# Patient Record
Sex: Female | Born: 1950 | Race: Black or African American | Hispanic: No | Marital: Single | Smoking: Current every day smoker
Health system: Southern US, Community
[De-identification: ages and names within clinical notes are randomized; demographics above are authoritative.]

## PROBLEM LIST (undated history)

## (undated) DIAGNOSIS — M48061 Spinal stenosis, lumbar region without neurogenic claudication: Secondary | ICD-10-CM

## (undated) DIAGNOSIS — E785 Hyperlipidemia, unspecified: Secondary | ICD-10-CM

## (undated) DIAGNOSIS — I509 Heart failure, unspecified: Secondary | ICD-10-CM

## (undated) DIAGNOSIS — M545 Low back pain, unspecified: Secondary | ICD-10-CM

## (undated) DIAGNOSIS — D649 Anemia, unspecified: Secondary | ICD-10-CM

## (undated) DIAGNOSIS — M25562 Pain in left knee: Secondary | ICD-10-CM

## (undated) DIAGNOSIS — N951 Menopausal and female climacteric states: Secondary | ICD-10-CM

## (undated) DIAGNOSIS — I10 Essential (primary) hypertension: Secondary | ICD-10-CM

## (undated) DIAGNOSIS — E538 Deficiency of other specified B group vitamins: Secondary | ICD-10-CM

## (undated) DIAGNOSIS — T148XXA Other injury of unspecified body region, initial encounter: Secondary | ICD-10-CM

## (undated) DIAGNOSIS — F329 Major depressive disorder, single episode, unspecified: Secondary | ICD-10-CM

## (undated) DIAGNOSIS — G629 Polyneuropathy, unspecified: Secondary | ICD-10-CM

## (undated) DIAGNOSIS — J320 Chronic maxillary sinusitis: Secondary | ICD-10-CM

## (undated) DIAGNOSIS — I2510 Atherosclerotic heart disease of native coronary artery without angina pectoris: Secondary | ICD-10-CM

## (undated) DIAGNOSIS — J40 Bronchitis, not specified as acute or chronic: Secondary | ICD-10-CM

## (undated) DIAGNOSIS — D126 Benign neoplasm of colon, unspecified: Secondary | ICD-10-CM

## (undated) DIAGNOSIS — I469 Cardiac arrest, cause unspecified: Secondary | ICD-10-CM

## (undated) DIAGNOSIS — K5730 Diverticulosis of large intestine without perforation or abscess without bleeding: Secondary | ICD-10-CM

## (undated) DIAGNOSIS — K529 Noninfective gastroenteritis and colitis, unspecified: Secondary | ICD-10-CM

## (undated) DIAGNOSIS — F32A Depression, unspecified: Secondary | ICD-10-CM

## (undated) DIAGNOSIS — M7072 Other bursitis of hip, left hip: Secondary | ICD-10-CM

## (undated) DIAGNOSIS — M1990 Unspecified osteoarthritis, unspecified site: Secondary | ICD-10-CM

## (undated) DIAGNOSIS — R110 Nausea: Secondary | ICD-10-CM

## (undated) DIAGNOSIS — B373 Candidiasis of vulva and vagina: Secondary | ICD-10-CM

## (undated) DIAGNOSIS — G4733 Obstructive sleep apnea (adult) (pediatric): Principal | ICD-10-CM

## (undated) DIAGNOSIS — B3731 Acute candidiasis of vulva and vagina: Secondary | ICD-10-CM

## (undated) DIAGNOSIS — H6690 Otitis media, unspecified, unspecified ear: Secondary | ICD-10-CM

## (undated) DIAGNOSIS — R5383 Other fatigue: Secondary | ICD-10-CM

## (undated) DIAGNOSIS — S42309A Unspecified fracture of shaft of humerus, unspecified arm, initial encounter for closed fracture: Secondary | ICD-10-CM

## (undated) DIAGNOSIS — H6120 Impacted cerumen, unspecified ear: Secondary | ICD-10-CM

## (undated) DIAGNOSIS — F17200 Nicotine dependence, unspecified, uncomplicated: Secondary | ICD-10-CM

## (undated) DIAGNOSIS — F419 Anxiety disorder, unspecified: Secondary | ICD-10-CM

## (undated) HISTORY — DX: Hyperlipidemia, unspecified: E78.5

## (undated) HISTORY — DX: Benign neoplasm of colon, unspecified: D12.6

## (undated) HISTORY — PX: CORONARY ANGIOPLASTY WITH STENT PLACEMENT: SHX49

## (undated) HISTORY — DX: Otitis media, unspecified, unspecified ear: H66.90

## (undated) HISTORY — DX: Atherosclerotic heart disease of native coronary artery without angina pectoris: I25.10

## (undated) HISTORY — DX: Pain in left knee: M25.562

## (undated) HISTORY — DX: Anemia, unspecified: D64.9

## (undated) HISTORY — DX: Anxiety disorder, unspecified: F41.9

## (undated) HISTORY — DX: Nausea: R11.0

## (undated) HISTORY — DX: Noninfective gastroenteritis and colitis, unspecified: K52.9

## (undated) HISTORY — DX: Impacted cerumen, unspecified ear: H61.20

## (undated) HISTORY — DX: Other fatigue: R53.83

## (undated) HISTORY — DX: Other bursitis of hip, left hip: M70.72

## (undated) HISTORY — PX: EYE SURGERY: SHX253

## (undated) HISTORY — DX: Nicotine dependence, unspecified, uncomplicated: F17.200

## (undated) HISTORY — DX: Polyneuropathy, unspecified: G62.9

## (undated) HISTORY — DX: Spinal stenosis, lumbar region without neurogenic claudication: M48.061

## (undated) HISTORY — DX: Low back pain: M54.5

## (undated) HISTORY — DX: Deficiency of other specified B group vitamins: E53.8

## (undated) HISTORY — DX: Bronchitis, not specified as acute or chronic: J40

## (undated) HISTORY — DX: Major depressive disorder, single episode, unspecified: F32.9

## (undated) HISTORY — DX: Other injury of unspecified body region, initial encounter: T14.8XXA

## (undated) HISTORY — DX: Menopausal and female climacteric states: N95.1

## (undated) HISTORY — DX: Essential (primary) hypertension: I10

## (undated) HISTORY — DX: Obstructive sleep apnea (adult) (pediatric): G47.33

## (undated) HISTORY — DX: Depression, unspecified: F32.A

## (undated) HISTORY — DX: Candidiasis of vulva and vagina: B37.3

## (undated) HISTORY — DX: Acute candidiasis of vulva and vagina: B37.31

## (undated) HISTORY — PX: CARDIAC CATHETERIZATION: SHX172

## (undated) HISTORY — DX: Chronic maxillary sinusitis: J32.0

## (undated) HISTORY — DX: Diverticulosis of large intestine without perforation or abscess without bleeding: K57.30

## (undated) HISTORY — DX: Low back pain, unspecified: M54.50

---

## 2001-04-13 HISTORY — PX: CERVICAL SPINE SURGERY: SHX589

## 2002-03-16 ENCOUNTER — Encounter: Payer: Self-pay | Admitting: Neurosurgery

## 2002-03-20 ENCOUNTER — Inpatient Hospital Stay (HOSPITAL_COMMUNITY): Admission: RE | Admit: 2002-03-20 | Discharge: 2002-03-22 | Payer: Self-pay | Admitting: Neurosurgery

## 2002-03-20 ENCOUNTER — Encounter: Payer: Self-pay | Admitting: Neurosurgery

## 2002-09-29 ENCOUNTER — Encounter: Admission: RE | Admit: 2002-09-29 | Discharge: 2002-12-19 | Payer: Self-pay | Admitting: Internal Medicine

## 2003-03-19 ENCOUNTER — Encounter: Admission: RE | Admit: 2003-03-19 | Discharge: 2003-03-19 | Payer: Self-pay | Admitting: Internal Medicine

## 2003-09-24 ENCOUNTER — Encounter: Admission: RE | Admit: 2003-09-24 | Discharge: 2003-09-24 | Payer: Self-pay | Admitting: Internal Medicine

## 2004-02-20 ENCOUNTER — Ambulatory Visit: Payer: Self-pay | Admitting: Internal Medicine

## 2004-03-31 ENCOUNTER — Ambulatory Visit: Payer: Self-pay | Admitting: Internal Medicine

## 2004-05-02 ENCOUNTER — Ambulatory Visit: Payer: Self-pay

## 2004-05-07 ENCOUNTER — Ambulatory Visit: Payer: Self-pay | Admitting: Internal Medicine

## 2004-05-16 ENCOUNTER — Ambulatory Visit: Payer: Self-pay | Admitting: Internal Medicine

## 2004-05-16 ENCOUNTER — Ambulatory Visit: Payer: Self-pay | Admitting: Cardiology

## 2004-05-27 ENCOUNTER — Ambulatory Visit: Payer: Self-pay | Admitting: Cardiovascular Disease

## 2004-05-27 ENCOUNTER — Ambulatory Visit: Payer: Self-pay | Admitting: Internal Medicine

## 2004-05-30 ENCOUNTER — Inpatient Hospital Stay (HOSPITAL_BASED_OUTPATIENT_CLINIC_OR_DEPARTMENT_OTHER): Admission: RE | Admit: 2004-05-30 | Discharge: 2004-05-30 | Payer: Self-pay | Admitting: Cardiovascular Disease

## 2004-06-02 ENCOUNTER — Ambulatory Visit (HOSPITAL_COMMUNITY): Admission: RE | Admit: 2004-06-02 | Discharge: 2004-06-03 | Payer: Self-pay | Admitting: Cardiology

## 2004-06-02 ENCOUNTER — Ambulatory Visit: Payer: Self-pay | Admitting: Cardiovascular Disease

## 2004-06-24 ENCOUNTER — Ambulatory Visit: Payer: Self-pay | Admitting: Internal Medicine

## 2004-07-03 ENCOUNTER — Ambulatory Visit: Payer: Self-pay | Admitting: Cardiovascular Disease

## 2004-07-18 ENCOUNTER — Ambulatory Visit: Payer: Self-pay | Admitting: Internal Medicine

## 2004-07-18 ENCOUNTER — Ambulatory Visit (HOSPITAL_COMMUNITY): Admission: RE | Admit: 2004-07-18 | Discharge: 2004-07-18 | Payer: Self-pay | Admitting: Internal Medicine

## 2004-10-09 ENCOUNTER — Ambulatory Visit: Payer: Self-pay | Admitting: Cardiovascular Disease

## 2004-10-24 ENCOUNTER — Ambulatory Visit: Payer: Self-pay | Admitting: Internal Medicine

## 2004-10-29 ENCOUNTER — Ambulatory Visit: Payer: Self-pay | Admitting: Internal Medicine

## 2005-01-01 ENCOUNTER — Ambulatory Visit: Payer: Self-pay | Admitting: Endocrinology

## 2005-01-08 ENCOUNTER — Ambulatory Visit: Payer: Self-pay | Admitting: Cardiovascular Disease

## 2005-01-08 ENCOUNTER — Ambulatory Visit: Payer: Self-pay | Admitting: Cardiology

## 2005-01-08 ENCOUNTER — Ambulatory Visit: Payer: Self-pay

## 2005-01-14 ENCOUNTER — Inpatient Hospital Stay (HOSPITAL_BASED_OUTPATIENT_CLINIC_OR_DEPARTMENT_OTHER): Admission: RE | Admit: 2005-01-14 | Discharge: 2005-01-14 | Payer: Self-pay | Admitting: Cardiology

## 2005-01-14 ENCOUNTER — Ambulatory Visit: Payer: Self-pay | Admitting: Cardiology

## 2005-01-22 ENCOUNTER — Ambulatory Visit: Payer: Self-pay | Admitting: Cardiovascular Disease

## 2005-02-02 ENCOUNTER — Ambulatory Visit: Admission: RE | Admit: 2005-02-02 | Discharge: 2005-02-02 | Payer: Self-pay | Admitting: Cardiovascular Disease

## 2005-02-04 ENCOUNTER — Ambulatory Visit: Payer: Self-pay | Admitting: Internal Medicine

## 2005-02-09 ENCOUNTER — Ambulatory Visit: Payer: Self-pay | Admitting: Pulmonary Disease

## 2005-04-20 ENCOUNTER — Ambulatory Visit: Payer: Self-pay | Admitting: Internal Medicine

## 2005-05-13 ENCOUNTER — Ambulatory Visit: Payer: Self-pay | Admitting: Internal Medicine

## 2005-05-25 ENCOUNTER — Ambulatory Visit: Payer: Self-pay | Admitting: Cardiovascular Disease

## 2005-05-28 ENCOUNTER — Ambulatory Visit: Payer: Self-pay | Admitting: Internal Medicine

## 2005-10-19 ENCOUNTER — Ambulatory Visit: Payer: Self-pay | Admitting: Internal Medicine

## 2005-10-23 ENCOUNTER — Ambulatory Visit: Payer: Self-pay | Admitting: Internal Medicine

## 2005-11-06 ENCOUNTER — Ambulatory Visit: Payer: Self-pay | Admitting: Cardiovascular Disease

## 2006-03-11 ENCOUNTER — Ambulatory Visit: Payer: Self-pay | Admitting: Internal Medicine

## 2006-05-13 ENCOUNTER — Ambulatory Visit: Payer: Self-pay | Admitting: Cardiovascular Disease

## 2006-07-13 ENCOUNTER — Ambulatory Visit: Payer: Self-pay | Admitting: Internal Medicine

## 2006-07-21 ENCOUNTER — Encounter: Admission: RE | Admit: 2006-07-21 | Discharge: 2006-07-21 | Payer: Self-pay | Admitting: Internal Medicine

## 2006-07-23 ENCOUNTER — Ambulatory Visit: Payer: Self-pay | Admitting: Internal Medicine

## 2006-07-23 LAB — CONVERTED CEMR LAB
Albumin: 3.8 g/dL (ref 3.5–5.2)
Alkaline Phosphatase: 56 units/L (ref 39–117)
Bilirubin, Direct: 0.1 mg/dL (ref 0.0–0.3)
CO2: 32 meq/L (ref 19–32)
Glucose, Bld: 157 mg/dL — ABNORMAL HIGH (ref 70–99)
LDL Cholesterol: 52 mg/dL (ref 0–99)
Microalb, Ur: 0.2 mg/dL (ref 0.0–1.9)
Potassium: 3.7 meq/L (ref 3.5–5.1)
Sodium: 143 meq/L (ref 135–145)
Total CHOL/HDL Ratio: 2.8
VLDL: 21 mg/dL (ref 0–40)

## 2006-08-23 ENCOUNTER — Ambulatory Visit (HOSPITAL_COMMUNITY): Admission: RE | Admit: 2006-08-23 | Discharge: 2006-08-23 | Payer: Self-pay | Admitting: Neurosurgery

## 2006-11-02 ENCOUNTER — Ambulatory Visit: Payer: Self-pay

## 2006-11-09 ENCOUNTER — Ambulatory Visit: Payer: Self-pay | Admitting: Cardiovascular Disease

## 2006-11-23 ENCOUNTER — Ambulatory Visit: Payer: Self-pay | Admitting: Internal Medicine

## 2006-11-23 LAB — CONVERTED CEMR LAB
Bilirubin Urine: NEGATIVE
Ketones, ur: NEGATIVE mg/dL
Leukocytes, UA: NEGATIVE
Nitrite: NEGATIVE
RBC / HPF: NONE SEEN
Urobilinogen, UA: 0.2 (ref 0.0–1.0)

## 2007-01-13 ENCOUNTER — Ambulatory Visit: Payer: Self-pay | Admitting: Gastroenterology

## 2007-02-08 ENCOUNTER — Encounter: Admission: RE | Admit: 2007-02-08 | Discharge: 2007-02-08 | Payer: Self-pay | Admitting: Obstetrics and Gynecology

## 2007-02-18 ENCOUNTER — Encounter: Payer: Self-pay | Admitting: Gastroenterology

## 2007-02-18 ENCOUNTER — Ambulatory Visit: Payer: Self-pay | Admitting: Gastroenterology

## 2007-02-18 ENCOUNTER — Encounter: Payer: Self-pay | Admitting: Internal Medicine

## 2007-02-18 DIAGNOSIS — D126 Benign neoplasm of colon, unspecified: Secondary | ICD-10-CM

## 2007-02-25 ENCOUNTER — Ambulatory Visit: Payer: Self-pay | Admitting: Cardiovascular Disease

## 2007-03-25 ENCOUNTER — Ambulatory Visit: Payer: Self-pay | Admitting: Internal Medicine

## 2007-03-25 DIAGNOSIS — I10 Essential (primary) hypertension: Secondary | ICD-10-CM | POA: Insufficient documentation

## 2007-03-25 DIAGNOSIS — I2510 Atherosclerotic heart disease of native coronary artery without angina pectoris: Secondary | ICD-10-CM | POA: Insufficient documentation

## 2007-03-25 DIAGNOSIS — G609 Hereditary and idiopathic neuropathy, unspecified: Secondary | ICD-10-CM | POA: Insufficient documentation

## 2007-03-25 DIAGNOSIS — E785 Hyperlipidemia, unspecified: Secondary | ICD-10-CM | POA: Insufficient documentation

## 2007-03-25 DIAGNOSIS — F411 Generalized anxiety disorder: Secondary | ICD-10-CM | POA: Insufficient documentation

## 2007-03-25 DIAGNOSIS — F329 Major depressive disorder, single episode, unspecified: Secondary | ICD-10-CM

## 2007-03-25 DIAGNOSIS — K5730 Diverticulosis of large intestine without perforation or abscess without bleeding: Secondary | ICD-10-CM | POA: Insufficient documentation

## 2007-03-30 ENCOUNTER — Ambulatory Visit: Payer: Self-pay | Admitting: Gastroenterology

## 2007-06-23 ENCOUNTER — Ambulatory Visit: Payer: Self-pay | Admitting: Internal Medicine

## 2007-06-23 DIAGNOSIS — N951 Menopausal and female climacteric states: Secondary | ICD-10-CM | POA: Insufficient documentation

## 2007-06-23 DIAGNOSIS — M76899 Other specified enthesopathies of unspecified lower limb, excluding foot: Secondary | ICD-10-CM

## 2007-06-30 ENCOUNTER — Telehealth: Payer: Self-pay | Admitting: Internal Medicine

## 2007-08-03 ENCOUNTER — Ambulatory Visit: Payer: Self-pay | Admitting: Internal Medicine

## 2007-08-03 LAB — CONVERTED CEMR LAB
ALT: 14 units/L (ref 0–35)
AST: 17 units/L (ref 0–37)
Albumin: 3.9 g/dL (ref 3.5–5.2)
Alkaline Phosphatase: 68 units/L (ref 39–117)
BUN: 9 mg/dL (ref 6–23)
Bilirubin, Direct: 0.1 mg/dL (ref 0.0–0.3)
Calcium: 9.1 mg/dL (ref 8.4–10.5)
Chloride: 104 meq/L (ref 96–112)
Cholesterol: 135 mg/dL (ref 0–200)
GFR calc Af Amer: 134 mL/min
GFR calc non Af Amer: 111 mL/min
HDL: 46.5 mg/dL (ref >39.0)
Hgb A1c MFr Bld: 7.3 % — ABNORMAL HIGH (ref 4.6–6.0)
Microalb, Ur: 3.4 mg/dL — ABNORMAL HIGH (ref 0.0–1.9)
Sodium: 142 meq/L (ref 135–145)
Total Bilirubin: 0.6 mg/dL (ref 0.3–1.2)
Total CHOL/HDL Ratio: 2.9
Total Protein: 7.4 g/dL (ref 6.0–8.3)

## 2007-08-04 ENCOUNTER — Ambulatory Visit: Payer: Self-pay | Admitting: Internal Medicine

## 2007-08-08 ENCOUNTER — Encounter: Payer: Self-pay | Admitting: Internal Medicine

## 2007-09-02 ENCOUNTER — Ambulatory Visit: Payer: Self-pay | Admitting: Cardiovascular Disease

## 2007-10-27 ENCOUNTER — Ambulatory Visit: Payer: Self-pay | Admitting: Cardiovascular Disease

## 2007-10-27 ENCOUNTER — Encounter: Payer: Self-pay | Admitting: Internal Medicine

## 2007-10-27 ENCOUNTER — Ambulatory Visit: Payer: Self-pay

## 2007-10-27 LAB — CONVERTED CEMR LAB
BUN: 9 mg/dL (ref 6–23)
Basophils Absolute: 0 10*3/uL (ref 0.0–0.1)
Basophils Relative: 0.9 % (ref 0.0–3.0)
Calcium: 9.2 mg/dL (ref 8.4–10.5)
Eosinophils Relative: 4 % (ref 0.0–5.0)
GFR calc non Af Amer: 111 mL/min
Glucose, Bld: 260 mg/dL — ABNORMAL HIGH (ref 70–99)
Hemoglobin: 11.9 g/dL — ABNORMAL LOW (ref 12.0–15.0)
Lymphocytes Relative: 30.8 % (ref 12.0–46.0)
MCHC: 34.1 g/dL (ref 30.0–36.0)
Monocytes Relative: 7.2 % (ref 3.0–12.0)
Neutrophils Relative %: 57.1 % (ref 43.0–77.0)
RDW: 12.5 % (ref 11.5–14.6)
Sodium: 136 meq/L (ref 135–145)
Total Protein: 7.2 g/dL (ref 6.0–8.3)
WBC: 4.3 10*3/uL — ABNORMAL LOW (ref 4.5–10.5)

## 2007-11-03 ENCOUNTER — Ambulatory Visit: Payer: Self-pay | Admitting: Cardiovascular Disease

## 2007-11-03 ENCOUNTER — Inpatient Hospital Stay (HOSPITAL_BASED_OUTPATIENT_CLINIC_OR_DEPARTMENT_OTHER): Admission: RE | Admit: 2007-11-03 | Discharge: 2007-11-03 | Payer: Self-pay | Admitting: Cardiovascular Disease

## 2008-01-03 ENCOUNTER — Ambulatory Visit: Payer: Self-pay | Admitting: Internal Medicine

## 2008-01-03 LAB — CONVERTED CEMR LAB
BUN: 10 mg/dL (ref 6–23)
CO2: 29 meq/L (ref 19–32)
Calcium: 9.1 mg/dL (ref 8.4–10.5)
Creatinine,U: 37.3 mg/dL
Microalb, Ur: 0.3 mg/dL (ref 0.0–1.9)
Potassium: 3.7 meq/L (ref 3.5–5.1)

## 2008-01-11 ENCOUNTER — Ambulatory Visit: Payer: Self-pay | Admitting: Internal Medicine

## 2008-01-12 DIAGNOSIS — E1165 Type 2 diabetes mellitus with hyperglycemia: Secondary | ICD-10-CM

## 2008-01-24 ENCOUNTER — Encounter: Payer: Self-pay | Admitting: Internal Medicine

## 2008-01-26 ENCOUNTER — Ambulatory Visit: Payer: Self-pay | Admitting: Cardiovascular Disease

## 2008-01-31 ENCOUNTER — Encounter: Payer: Self-pay | Admitting: Internal Medicine

## 2008-02-03 ENCOUNTER — Ambulatory Visit: Payer: Self-pay | Admitting: Internal Medicine

## 2008-02-03 ENCOUNTER — Ambulatory Visit (HOSPITAL_BASED_OUTPATIENT_CLINIC_OR_DEPARTMENT_OTHER): Admission: RE | Admit: 2008-02-03 | Discharge: 2008-02-03 | Payer: Self-pay | Admitting: Internal Medicine

## 2008-02-03 ENCOUNTER — Telehealth (INDEPENDENT_AMBULATORY_CARE_PROVIDER_SITE_OTHER): Payer: Self-pay | Admitting: *Deleted

## 2008-02-03 DIAGNOSIS — M25569 Pain in unspecified knee: Secondary | ICD-10-CM | POA: Insufficient documentation

## 2008-02-09 ENCOUNTER — Encounter: Admission: RE | Admit: 2008-02-09 | Discharge: 2008-02-09 | Payer: Self-pay | Admitting: Obstetrics and Gynecology

## 2008-02-15 ENCOUNTER — Encounter: Admission: RE | Admit: 2008-02-15 | Discharge: 2008-02-15 | Payer: Self-pay | Admitting: Orthopedic Surgery

## 2008-02-27 ENCOUNTER — Encounter: Payer: Self-pay | Admitting: Internal Medicine

## 2008-03-05 ENCOUNTER — Telehealth: Payer: Self-pay | Admitting: Internal Medicine

## 2008-03-05 ENCOUNTER — Ambulatory Visit: Payer: Self-pay | Admitting: Internal Medicine

## 2008-03-05 LAB — CONVERTED CEMR LAB: Pap Smear: NORMAL

## 2008-03-05 LAB — HM MAMMOGRAPHY: HM Mammogram: NORMAL

## 2008-03-20 ENCOUNTER — Encounter: Payer: Self-pay | Admitting: Internal Medicine

## 2008-04-16 ENCOUNTER — Encounter: Admission: RE | Admit: 2008-04-16 | Discharge: 2008-04-16 | Payer: Self-pay | Admitting: Orthopedic Surgery

## 2008-06-28 ENCOUNTER — Ambulatory Visit: Payer: Self-pay | Admitting: Internal Medicine

## 2008-06-28 LAB — CONVERTED CEMR LAB
Alkaline Phosphatase: 62 units/L (ref 39–117)
BUN: 9 mg/dL (ref 6–23)
CO2: 29 meq/L (ref 19–32)
Creatinine, Ser: 0.7 mg/dL (ref 0.4–1.2)
Creatinine,U: 139.4 mg/dL
GFR calc non Af Amer: 111.47 mL/min (ref >60)
Glucose, Bld: 270 mg/dL — ABNORMAL HIGH (ref 70–99)
HDL: 39.9 mg/dL (ref >39.00)
Microalb, Ur: 1.2 mg/dL (ref 0.0–1.9)
Potassium: 3.5 meq/L (ref 3.5–5.1)
Sodium: 139 meq/L (ref 135–145)
Total Protein: 7.3 g/dL (ref 6.0–8.3)
Vit D, 1,25-Dihydroxy: 12 — ABNORMAL LOW (ref 30–89)
Vit D, 1,25-Dihydroxy: 12 — ABNORMAL LOW (ref 30–89)

## 2008-06-29 ENCOUNTER — Telehealth: Payer: Self-pay | Admitting: Internal Medicine

## 2008-06-29 ENCOUNTER — Encounter: Payer: Self-pay | Admitting: Internal Medicine

## 2008-09-05 ENCOUNTER — Ambulatory Visit: Payer: Self-pay | Admitting: Internal Medicine

## 2008-09-05 LAB — CONVERTED CEMR LAB
BUN: 10 mg/dL (ref 6–23)
CO2: 33 meq/L — ABNORMAL HIGH (ref 19–32)
Calcium: 9 mg/dL (ref 8.4–10.5)
Chloride: 104 meq/L (ref 96–112)
Creatinine, Ser: 0.6 mg/dL (ref 0.4–1.2)
Glucose, Bld: 277 mg/dL — ABNORMAL HIGH (ref 70–99)
Sodium: 141 meq/L (ref 135–145)

## 2008-09-07 ENCOUNTER — Encounter: Payer: Self-pay | Admitting: Internal Medicine

## 2008-10-25 ENCOUNTER — Ambulatory Visit: Payer: Self-pay | Admitting: Family Medicine

## 2008-11-06 ENCOUNTER — Ambulatory Visit: Payer: Self-pay | Admitting: Internal Medicine

## 2008-11-06 ENCOUNTER — Ambulatory Visit: Payer: Self-pay | Admitting: Diagnostic Radiology

## 2008-11-06 ENCOUNTER — Ambulatory Visit (HOSPITAL_BASED_OUTPATIENT_CLINIC_OR_DEPARTMENT_OTHER): Admission: RE | Admit: 2008-11-06 | Discharge: 2008-11-06 | Payer: Self-pay | Admitting: Internal Medicine

## 2008-11-06 DIAGNOSIS — M545 Low back pain: Secondary | ICD-10-CM

## 2008-11-07 ENCOUNTER — Encounter: Payer: Self-pay | Admitting: Internal Medicine

## 2008-11-07 ENCOUNTER — Telehealth: Payer: Self-pay | Admitting: Internal Medicine

## 2008-11-19 ENCOUNTER — Ambulatory Visit: Payer: Self-pay | Admitting: Internal Medicine

## 2008-12-06 LAB — CONVERTED CEMR LAB
ALT: 12 units/L (ref 0–35)
Alkaline Phosphatase: 57 units/L (ref 39–117)
Bilirubin Urine: NEGATIVE
Chloride: 102 meq/L (ref 96–112)
Glucose, Bld: 148 mg/dL — ABNORMAL HIGH (ref 70–99)
Hemoglobin, Urine: NEGATIVE
Indirect Bilirubin: 0.3 mg/dL (ref 0.0–0.9)
Ketones, ur: NEGATIVE mg/dL
Potassium: 4.1 meq/L (ref 3.5–5.3)
Protein, ur: NEGATIVE mg/dL
Sodium: 139 meq/L (ref 135–145)
Specific Gravity, Urine: 1.01 (ref 1.005–1.030)
Urobilinogen, UA: 1 (ref 0.0–1.0)
pH: 5.5 (ref 5.0–8.0)

## 2008-12-20 ENCOUNTER — Encounter (INDEPENDENT_AMBULATORY_CARE_PROVIDER_SITE_OTHER): Payer: Self-pay | Admitting: *Deleted

## 2009-01-21 ENCOUNTER — Telehealth: Payer: Self-pay | Admitting: Internal Medicine

## 2009-03-25 ENCOUNTER — Ambulatory Visit: Payer: Self-pay | Admitting: Internal Medicine

## 2009-03-25 DIAGNOSIS — H6120 Impacted cerumen, unspecified ear: Secondary | ICD-10-CM | POA: Insufficient documentation

## 2009-03-25 LAB — CONVERTED CEMR LAB
BUN: 10 mg/dL (ref 6–23)
CO2: 26 meq/L (ref 19–32)
Creatinine, Ser: 0.63 mg/dL (ref 0.40–1.20)
Glucose, Bld: 224 mg/dL — ABNORMAL HIGH (ref 70–99)
Hgb A1c MFr Bld: 10.3 % — ABNORMAL HIGH (ref 4.6–6.1)
Potassium: 3.8 meq/L (ref 3.5–5.3)

## 2009-03-26 ENCOUNTER — Encounter: Payer: Self-pay | Admitting: Internal Medicine

## 2009-04-15 ENCOUNTER — Ambulatory Visit: Payer: Self-pay | Admitting: Internal Medicine

## 2009-04-16 ENCOUNTER — Telehealth: Payer: Self-pay | Admitting: Internal Medicine

## 2009-05-01 ENCOUNTER — Telehealth: Payer: Self-pay | Admitting: Internal Medicine

## 2009-05-03 ENCOUNTER — Telehealth: Payer: Self-pay | Admitting: Internal Medicine

## 2009-05-06 ENCOUNTER — Telehealth: Payer: Self-pay | Admitting: Internal Medicine

## 2009-06-10 ENCOUNTER — Ambulatory Visit: Payer: Self-pay | Admitting: Family

## 2009-06-10 ENCOUNTER — Telehealth: Payer: Self-pay | Admitting: Internal Medicine

## 2009-06-10 DIAGNOSIS — E538 Deficiency of other specified B group vitamins: Secondary | ICD-10-CM | POA: Insufficient documentation

## 2009-06-10 DIAGNOSIS — D649 Anemia, unspecified: Secondary | ICD-10-CM | POA: Insufficient documentation

## 2009-06-10 LAB — CONVERTED CEMR LAB
Basophils Absolute: 0 10*3/uL (ref 0.0–0.1)
Eosinophils Absolute: 0.1 10*3/uL (ref 0.0–0.7)
Ferritin: 20.9 ng/mL (ref 10.0–291.0)
HCT: 38.1 % (ref 36.0–46.0)
Hemoglobin: 12.9 g/dL (ref 12.0–15.0)
Lymphocytes Relative: 20.9 % (ref 12.0–46.0)
Lymphs Abs: 1.4 10*3/uL (ref 0.7–4.0)
MCV: 92.1 fL (ref 78.0–100.0)
RBC: 4.14 M/uL (ref 3.87–5.11)
Vitamin B-12: 186 pg/mL — ABNORMAL LOW (ref 211–911)

## 2009-06-13 ENCOUNTER — Ambulatory Visit: Payer: Self-pay | Admitting: Internal Medicine

## 2009-06-20 ENCOUNTER — Ambulatory Visit: Payer: Self-pay | Admitting: Internal Medicine

## 2009-06-27 ENCOUNTER — Ambulatory Visit: Payer: Self-pay | Admitting: Internal Medicine

## 2009-06-27 LAB — CONVERTED CEMR LAB
Calcium: 9.5 mg/dL (ref 8.4–10.5)
Chloride: 103 meq/L (ref 96–112)
Creatinine, Ser: 0.66 mg/dL (ref 0.40–1.20)
Glucose, Bld: 135 mg/dL — ABNORMAL HIGH (ref 70–99)

## 2009-07-03 ENCOUNTER — Telehealth: Payer: Self-pay | Admitting: Internal Medicine

## 2009-07-04 ENCOUNTER — Ambulatory Visit: Payer: Self-pay | Admitting: Internal Medicine

## 2009-07-30 ENCOUNTER — Ambulatory Visit: Payer: Self-pay | Admitting: Internal Medicine

## 2009-08-15 ENCOUNTER — Telehealth: Payer: Self-pay | Admitting: Internal Medicine

## 2009-08-16 ENCOUNTER — Ambulatory Visit: Payer: Self-pay | Admitting: Internal Medicine

## 2009-08-21 ENCOUNTER — Telehealth: Payer: Self-pay | Admitting: Internal Medicine

## 2009-08-23 ENCOUNTER — Telehealth: Payer: Self-pay | Admitting: Internal Medicine

## 2009-08-27 ENCOUNTER — Encounter: Payer: Self-pay | Admitting: Internal Medicine

## 2009-09-10 ENCOUNTER — Telehealth: Payer: Self-pay | Admitting: Internal Medicine

## 2009-09-13 ENCOUNTER — Telehealth: Payer: Self-pay | Admitting: Internal Medicine

## 2009-11-22 ENCOUNTER — Ambulatory Visit: Payer: Self-pay | Admitting: Family

## 2009-12-27 ENCOUNTER — Ambulatory Visit: Payer: Self-pay | Admitting: Internal Medicine

## 2009-12-27 DIAGNOSIS — M48061 Spinal stenosis, lumbar region without neurogenic claudication: Secondary | ICD-10-CM

## 2009-12-27 LAB — CONVERTED CEMR LAB
BUN: 13 mg/dL (ref 6–23)
CO2: 28 meq/L (ref 19–32)
Calcium: 9.4 mg/dL (ref 8.4–10.5)
Creatinine, Urine: 135.1 mg/dL
Hgb A1c MFr Bld: 9.6 % — ABNORMAL HIGH (ref <5.7)
Microalb Creat Ratio: 6.6 mg/g (ref 0.0–30.0)
Microalb, Ur: 0.89 mg/dL (ref 0.00–1.89)
Sodium: 140 meq/L (ref 135–145)
Vitamin B-12: 395 pg/mL (ref 211–911)

## 2010-01-01 ENCOUNTER — Encounter: Payer: Self-pay | Admitting: Internal Medicine

## 2010-01-02 ENCOUNTER — Encounter: Admission: RE | Admit: 2010-01-02 | Discharge: 2010-01-02 | Payer: Self-pay | Admitting: Internal Medicine

## 2010-01-03 ENCOUNTER — Telehealth: Payer: Self-pay | Admitting: Internal Medicine

## 2010-01-13 ENCOUNTER — Encounter: Payer: Self-pay | Admitting: Internal Medicine

## 2010-01-29 ENCOUNTER — Ambulatory Visit: Payer: Self-pay | Admitting: Cardiovascular Disease

## 2010-02-10 ENCOUNTER — Telehealth: Payer: Self-pay | Admitting: Internal Medicine

## 2010-02-25 LAB — CONVERTED CEMR LAB: Pap Smear: NORMAL

## 2010-03-27 ENCOUNTER — Ambulatory Visit: Payer: Self-pay | Admitting: Internal Medicine

## 2010-05-13 NOTE — Progress Notes (Signed)
  Phone Note Outgoing Call   Summary of Call: call pt - if greater than one year on DM eye exam.  we need to schedule Initial call taken by: [name] DO,  Aug 15, 2009 6:03 PM

## 2010-05-13 NOTE — Letter (Signed)
   [place] [facility] [address] [place], [place]  [address]  [place] Phone: [phone]      January 01, 2010   [facility] [address] [place], [place] [address]  RE:  LAB RESULTS  Dear  [name] Bean,  The following is an interpretation of your most recent lab tests.  Please take note of any instructions provided or changes to medications that have resulted from your lab work.  ELECTROLYTES:  Good - no changes needed  KIDNEY FUNCTION TESTS:  Good - no changes needed    DIABETIC STUDIES:  Poor - schedule a follow-up appointment soon Blood Glucose: 186   HgbA1C: 9.6   Microalbumin/Creatinine Ratio: 6.6          Sincerely Yours,    Dr. Thomos Lemons  Appended Document:  mailed

## 2010-05-13 NOTE — Assessment & Plan Note (Signed)
Summary: B12 injections- jr  Nurse Visit   Allergies: No Known Drug Allergies  Medication Administration  Injection # 1:    Medication: Vit B12 1000 mcg    Diagnosis: B12 DEFICIENCY (ICD-266.2)    Route: IM    Site: R deltoid    Exp Date: 03/12/2010    Lot #: [id number]    Mfr: American Regent    Patient tolerated injection without complications    Given by: Glendell Docker CMA (June 13, 2009 9:12 AM)  Orders Added: 1)  Vit B12 1000 mcg [J3420] 2)  Admin of Therapeutic Inj  intramuscular or subcutaneous [96372]   Medication Administration  Injection # 1:    Medication: Vit B12 1000 mcg    Diagnosis: B12 DEFICIENCY (ICD-266.2)    Route: IM    Site: R deltoid    Exp Date: 03/12/2010    Lot #: [id number]    Mfr: American Regent    Patient tolerated injection without complications    Given by: Glendell Docker CMA (June 13, 2009 9:12 AM)  Orders Added: 1)  Vit B12 1000 mcg [J3420] 2)  Admin of Therapeutic Inj  intramuscular or subcutaneous [02585]

## 2010-05-13 NOTE — Progress Notes (Signed)
Summary: Rx Denial  Phone Note Refill Request Message from:  Fax from Pharmacy on July 03, 2009 1:17 PM  Refills Requested: Medication #1:  janumet   Dosage confirmed as above?Dosage Confirmed   Brand Name Necessary? No   Supply Requested: 1 month new rx [facility]  [facility]  [phone] phone [phone]   Method Requested: Electronic Next Appointment Scheduled: 07-04-09 inj  Initial call taken by: Roselle Locus,  July 03, 2009 1:18 PM  Follow-up for Phone Call        call placed to patient at [phone] to verify rx request, no answer, voice message stating mailbox is full unable to leave message for patient  Follow-up by: Glendell Docker CMA,  July 04, 2009 8:48 AM  Additional Follow-up for Phone Call Additional follow up Details #1::        spoke with patient when she came in the office for her B12 shot, she states she is not taking the Janumet, it was never prescribed.   [facility], Jill Side informed rx denied medication was never prescribed for patient  Additional Follow-up by: Glendell Docker CMA,  July 04, 2009 10:20 AM

## 2010-05-13 NOTE — Assessment & Plan Note (Signed)
Summary: LUMP IN THROAT HARD TO SWALLOW THROWING UP/MHF   Vital Signs:  Patient profile:   60 year old female Weight:      175.25 pounds BMI:     33.51 Temp:     98.3 degrees F tympanic Pulse rate:   92 / minute Pulse rhythm:   regular Resp:     16 per minute BP sitting:   130 / 78  (left arm) Cuff size:   regular  Vitals Entered By: Mervin Kung CMA (June 10, 2009 9:57 AM) CC: room 14  Throat hurts since vomiting episode this a.m. Is Patient Diabetic? Yes Comments Was off of vicotza for 4 day and restarted yesterday.   Primary Care Provider:  DThomos Lemons DO  CC:  room 14  Throat hurts since vomiting episode this a.m..  History of Present Illness: [name] Bean is a 60 year old female who presents today following a vomitting episode.  Notes that she has been following a strict diet.  Notes that she had a food binge over this weekend.  Had episode of vomitting a 3AM this morning.  Then went back to sleep, awoke a 4 AM and vomitted again. She did note some "red stuff in throat"  She also notes sore throat, tender to the touch. Notes that she has not had victoza since thursday- med ran out, wonders if this is related to discontinuing victoza.  Denies fever,  + nausea, denies  diarrhea, stools are dark.    Allergies (verified): No Known Drug Allergies  Physical Exam  General:  Well-developed,well-nourished,in no acute distress; alert,appropriate and cooperative throughout examination Lungs:  Normal respiratory effort, chest expands symmetrically. Lungs are clear to auscultation, no crackles or wheezes. Heart:  Normal rate and regular rhythm. S1 and S2 normal without gallop, murmur, click, rub or other extra sounds. Abdomen:  Bowel sounds positive,abdomen soft and non-tender without masses, organomegaly or hernias noted. Rectal:  No external abnormalities noted. Normal sphincter tone. No rectal masses or tenderness. + skin tag noted between buttocks.  Heme  negative   Impression & Recommendations:  Problem # 1:  GASTROENTERITIS, ACUTE (ICD-558.9) Assessment New I suspect that she has an acute gastroenteritis.  She may have had a small mallory-weiss tear which contributed to scant amt of blood in emesis and sore throat.   Plan to encourage fluids and will add as needed zofran.   Her updated medication list for this problem includes:    Glimepiride 2 Mg Tabs (Glimepiride) .[name]... Take 1 tablet by mouth two times a day    Metformin Hcl 1000 Mg Tabs (Metformin hcl) .[name]... Take 1/2 tablet by mouth two times a day    Victoza 18 Mg/26ml Soln (Liraglutide) ..... Inject 1.2 mg subcutaneously once daily    Zofran 4 Mg Tabs (Ondansetron hcl) ..... One tablet by mouth every 8 hours as needed for nausea  Problem # 2:  ANEMIA (ICD-285.9) Assessment: Comment Only patient is heme negative- last hgb 11.9 back in 2009, will repeat with anemia panel today.  Up to date on colo. Due in 2013 per records Orders: Venipuncture ([address]) TLB-CBC Platelet - w/Differential (85025-CBCD) T-Ferritin [phone]) T-Ferritin [phone]) T-Iron Binding Capacity (TIBC) ([address]) T-Iron [phone]) T-Vitamin B12 (324[address]-10272) T-Folate (536[address])  Complete Medication List: 1)  Crestor 10 Mg Tabs (Rosuvastatin calcium) .[name].. 1po qd 2)  Diovan Hct 160-12.5 Mg Tabs (Valsartan-hydrochlorothiazide) .... One by mouth once daily 3)  Glimepiride 2 Mg Tabs (Glimepiride) .... Take 1 tablet by mouth two times a day  4)  Plavix 75 Mg Tabs (Clopidogrel bisulfate) .[name].. 1 by mouth qd 5)  Metformin Hcl 1000 Mg Tabs (Metformin hcl) .... Take 1/2 tablet by mouth two times a day 6)  Accu-chek Aviva Strp (Glucose blood) .... For once daily testing 7)  Bd U/f Short Pen Needle 31g X 8 Mm Misc (Insulin pen needle) .... Use daily for victoza injection 8)  Metoprolol Succinate 50 Mg Xr24h-tab (Metoprolol succinate) .... One by mouth qd 9)  Fluoxetine Hcl 10 Mg Tabs (Fluoxetine hcl) .... One by  mouth once daily 10)  Victoza 18 Mg/47ml Soln (Liraglutide) .... Inject 1.2 mg subcutaneously once daily 11)  Zofran 4 Mg Tabs (Ondansetron hcl) .... One tablet by mouth every 8 hours as needed for nausea  Other Orders: TLB-Iron, (Fe) Total (83540-FE) TLB-Ferritin (82728-FER) TLB-B12 + Folate Pnl (62130_86578-I69/GEX)  Patient Instructions: 1)  Call if you develop fever over 101, abdominal pain, or inability to keep down food/liquid or medicines Prescriptions: ZOFRAN 4 MG TABS (ONDANSETRON HCL) one tablet by mouth every 8 hours as needed for nausea  #20 x 0   Entered and Authorized by:   Lemont Fillers FNP   Signed by:   Lemont Fillers FNP on 06/10/2009   Method used:   [facility]        [facility]  [phone]* (retail)       [address] [place], [place]  [address]       Ph: [phone] or [phone]       Fax: [phone]   RxID:   [id number]   Current Allergies (reviewed today): No known allergies

## 2010-05-13 NOTE — Progress Notes (Signed)
Summary: Medication Refill  Phone Note Refill Request  on May 06, 2009 10:13 AM  Refills Requested: Medication #1:  GLIMEPIRIDE 2 MG TABS Take 1 tablet by mouth two times a day   Dosage confirmed as above?Dosage Confirmed   Brand Name Necessary? No   Supply Requested: 1 month   Last Refilled: 02/16/2009  Medication #2:  METFORMIN HCL 1000 MG  TABS Take 1/2 tablet by mouth two times a day   Dosage confirmed as above?Dosage Confirmed   Brand Name Necessary? No   Supply Requested: 1 month   Last Refilled: 03/26/2009  Method Requested: Electronic Initial call taken by: Roselle Locus,  May 06, 2009 10:14 AM    Prescriptions: METFORMIN HCL 1000 MG  TABS (METFORMIN HCL) Take 1/2 tablet by mouth two times a day  #30 x 0   Entered by:   Glendell Docker CMA   Authorized by:   [name] DO   Signed by:   Glendell Docker CMA on 05/06/2009   Method used:   [facility]ron[facility] to        [facility]  [phone]* (retail)       [address] [place], [place]  [address]       Ph: [phone] or [phone]       Fax: [phone]   RxID:   [id number] GLIMEPIRIDE 2 MG TABS (GLIMEPIRIDE) Take 1 tablet by mouth two times a day  #60 x 0   Entered by:   Glendell Docker CMA   Authorized by:   [name] DO   Signed by:   Glendell Docker CMA on 05/06/2009   Method used:   [facility] to        [facility]  [phone]* (retail)       [address] [place], [place]  [address]       Ph: [phone] or [phone]       Fax: [phone]   RxID:   [id number]

## 2010-05-13 NOTE — Assessment & Plan Note (Signed)
Summary: BACK PAIN/MHF B 12 SHOT   Vital Signs:  Patient profile:   60 year old female Height:      60.75 inches Weight:      174 pounds BMI:     33.27 O2 Sat:      100 % on Room air Temp:     97.8 degrees F oral Pulse rate:   78 / minute Pulse rhythm:   regular Resp:     16 per minute BP sitting:   100 / 60  (right arm) Cuff size:   regular  Vitals Entered By: Glendell Docker CMA (June 27, 2009 9:04 AM)  O2 Flow:  Room air CC: Rm 2- Low Back Pain, Back pain Is Patient Diabetic? Yes Pain Assessment Patient in pain? yes     Location: lower back Intensity: 8 Type: aching Comments c/o radiating pain in lower back to groin area onset Sunday, denies injury, stated sudden onset   Primary Care Provider:  [name] DO  CC:  Rm 2- Low Back Pain and Back pain.  History of Present Illness:  Back Pain      This is a 60 year old woman who presents with Back pain.  The patient denies fever and chills.  The pain is located in the left low back.  The pain began at home.  no urinary symptoms  DM II - better appetite control with victoza.  she is not checking her blood sugars  Htn - no dizziness  Allergies (verified): No Known Drug Allergies  Past History:  Past Medical History: Diabetes mellitus, type II Hypertension Hyperlipidemia  Asthma  Coronary artery disease   Peripheral neuropathy  Depression Anxiety Diverticulosis, colon Colonic polyps, hx of History of right gluteus tear      Past Surgical History: s/p PTCA s/p stent 2002  s/p c-spine surgury 2003           Family History: mother with HTN and DM  father died at 80 yo with PNA, ETOH       Physical Exam  General:  alert, well-developed, and well-nourished.   Neck:  supple and no carotid bruits.   Lungs:  normal respiratory effort and normal breath sounds.   Heart:  normal rate, regular rhythm, and no gallop.   Msk:  spasm of left lumbar paraspinal muscles Extremities:  trace left pedal edema  and trace right pedal edema.   Neurologic:  cranial nerves II-XII intact.     Impression & Recommendations:  Problem # 1:  BACK PAIN, LUMBAR (ICD-724.2) I suspect lumbar strain.  use muscle relaxer as directed.  Call our office if your symptoms do not  improve or gets worse.  Her updated medication list for this problem includes:    Metaxalone 800 Mg Tabs (Metaxalone) ..... One by mouth three times a day prn  Problem # 2:  DIABETES MELLITUS, TYPE II, UNCONTROLLED (ICD-250.02) Pt not monitoring CBGs.  better dietary compliance.  victoza helping.  Her updated medication list for this problem includes:    Diovan Hct 160-12.5 Mg Tabs (Valsartan-hydrochlorothiazide) ..... One by mouth once daily    Glimepiride 2 Mg Tabs (Glimepiride) .[name]... Take 1 tablet by mouth two times a day    Metformin Hcl 1000 Mg Tabs (Metformin hcl) .[name]... Take 1/2 tablet by mouth two times a day    Victoza 18 Mg/12ml Soln (Liraglutide) ..... Inject 1.2 mg subcutaneously once daily  Orders: T-Basic Metabolic Panel [id number]) T- Hemoglobin A1C [id number])  Labs Reviewed: Creat: 0.63 (  03/25/2009)     Last Eye Exam: BDR (01/31/2008) Reviewed HgBA1c results: 10.3 (03/25/2009)  7.9 (09/05/2008)  Problem # 3:  B12 DEFICIENCY (ICD-266.2) B12 ordered as part of anemia workup.  check anti parietal and intrinsic factor antibody.  Orders: T- * Misc. Laboratory test [id number]) Admin of Therapeutic Inj  intramuscular or subcutaneous (60454) Vit B12 1000 mcg (J3420)  Complete Medication List: 1)  Crestor 10 Mg Tabs (Rosuvastatin calcium) .[name].. 1po qd 2)  Diovan Hct 160-12.5 Mg Tabs (Valsartan-hydrochlorothiazide) .... One by mouth once daily 3)  Glimepiride 2 Mg Tabs (Glimepiride) .... Take 1 tablet by mouth two times a day 4)  Plavix 75 Mg Tabs (Clopidogrel bisulfate) .[name].. 1 by mouth qd 5)  Metformin Hcl 1000 Mg Tabs (Metformin hcl) .... Take 1/2 tablet by mouth two times a day 6)  Accu-chek Aviva Strp (Glucose  blood) .... For once daily testing 7)  Bd U/f Short Pen Needle 31g X 8 Mm Misc (Insulin pen needle) .... Use daily for victoza injection 8)  Metoprolol Succinate 50 Mg Xr24h-tab (Metoprolol succinate) .... One by mouth qd 9)  Fluoxetine Hcl 10 Mg Tabs (Fluoxetine hcl) .... One by mouth once daily 10)  Victoza 18 Mg/65ml Soln (Liraglutide) .... Inject 1.2 mg subcutaneously once daily 11)  Metaxalone 800 Mg Tabs (Metaxalone) .... One by mouth three times a day prn  Patient Instructions: 1)  Please schedule a follow-up appointment in 3 months. 2)  BMP prior to visit, ICD-9:  401.9 3)  HbgA1C prior to visit, ICD-9:  250.02 4)  B12 :  266.2 5)  Please return for lab work one (1) week before your next appointment.  Prescriptions: METAXALONE 800 MG TABS (METAXALONE) one by mouth three times a day prn  #21 x 0   Entered and Authorized by:   [name] DO   Signed by:   [name] DO on 06/27/2009   Method used:   [facility] to        [facility]  [phone]* (retail)       [address] [place], [place]  [address]       Ph: [phone] or [phone]       Fax: [phone]   RxID:   [id number]    Medication Administration  Injection # 1:    Medication: Vit B12 1000 mcg    Diagnosis: B12 DEFICIENCY (ICD-266.2)    Route: IM    Site: R deltoid    Exp Date: 03/13/2011    Lot #: [id number]    Mfr: American Regent    Patient tolerated injection without complications    Given by: Glendell Docker CMA (June 27, 2009 9:49 AM)  Orders Added: 1)  T-Basic Metabolic Panel [80048-22910] 2)  T- Hemoglobin A1C [83036-23375] 3)  T- * Misc. Laboratory test [phone] 4)  Admin of Therapeutic Inj  intramuscular or subcutaneous [96372] 5)  Vit B12 1000 mcg [J3420] 6)  Est. Patient Level III [38756]   Current Allergies (reviewed today): No known allergies

## 2010-05-13 NOTE — Assessment & Plan Note (Signed)
Summary: shoulder pain x 2 weeks/dt--Rm 2   Vital Signs:  Patient profile:   60 year old female Height:      60.75 inches Weight:      178.75 pounds BMI:     34.18 Temp:     98.7 degrees F oral Pulse rate:   90 / minute Pulse rhythm:   regular Resp:     18 per minute BP sitting:   118 / 60  (right arm) Cuff size:   regular  Vitals Entered By: Mervin Kung CMA Duncan Dull) (December 27, 2009 3:29 PM) CC: Rm 2  Pt states she is having pain under left arm and on left shoulder blade x 3 weeks. Is Patient Diabetic? Yes Pain Assessment Patient in pain? yes     Location: left shoulder Intensity: 3 Type: burning Comments Pt states she does not take Metaxalone as she doesn't need it any longer. Nicki Guadalajara Fergerson CMA Duncan Dull)  December 27, 2009 3:35 PM    Primary Care Provider:  Dondra Spry DO  CC:  Rm 2  Pt states she is having pain under left arm and on left shoulder blade x 3 weeks.[name]  History of Present Illness: 60 y/o AA female with hx of CAD, DM II for f/i left shoulder pain limited abduction hurts, burns,  difficult to sleep on that sign  onset - 3 weeks no injury or trauma  hx of multiple falls - last fall 1 week ago  she has trouble lifting right leg question from lumbar spine prev seen by neurosurgeon in [place],  no surgery recommended symptoms getting worse gait is abnormal.  always worried she may fall  07/2006 -  MRI   IMPRESSION:   1.  Prominent central and left paracentral protrusion at L4-5 causes   narrowing in the lateral recesses, worse on the left with left   greater than right foraminal narrowing.   2.  Central and left paracentral protrusion at L5-S1.  Disk contacts   the descending left S1 root without obviously impinging upon it.   3.  Facet arthropathy in the lower lumbar spine which appears worst   on the right at L5-S1.  DM II - poor dietary compliance.  not checking her sugars regularly  Preventive Screening-Counseling &  Management  Alcohol-Tobacco     Alcohol drinks/day: twice a month     Alcohol type: all     Alcohol Counseling: to decrease amount and/or frequency of alcohol intake     Smoking Status: quit     Year Quit: 2006     Tobacco Counseling: to remain off tobacco products  Allergies (verified): No Known Drug Allergies  Past History:  Past Medical History: Diabetes mellitus, type II Hypertension Hyperlipidemia   Asthma   Coronary artery disease   Peripheral neuropathy  Depression  Anxiety Diverticulosis, colon Colonic polyps, hx of History of right gluteus tear     History of low back pain   Social History: Former Smoker Alcohol use-no   Single  Occupation:  Production designer, 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  Physical Exam  General:  alert, well-developed, and well-nourished.   Head:  normocephalic and atraumatic.   Neck:  No deformities, masses, or tenderness noted. Lungs:  Normal respiratory effort, chest expands symmetrically. Lungs are clear to auscultation, no crackles or wheezes. Heart:  normal rate and  no gallop.   Extremities:  trace left pedal edema and trace right pedal edema.   Neurologic:  cranial nerves II-XII intact.   lower ext weakness,  right foot drop,  bilateral clonus (patellar reflex) Psych:  normally interactive, good eye contact, not anxious appearing, and not depressed appearing.     Impression & Recommendations:  Problem # 1:  SPINAL STENOSIS, LUMBAR (ICD-724.02) 60 y/o with progressive gait instability which I suspect is from spinal stenosis.   refer to neurosurgeon at [facility] for second opinion   IMPRESSION:   1.  Prominent central and left paracentral protrusion at L4-5 causes   narrowing in the lateral recesses, worse on the left with left   greater than right foraminal narrowing.   2.  Central and left paracentral protrusion at  L5-S1.  Disk contacts   the descending left S1 root without obviously impinging upon it.   3.  Facet arthropathy in the lower lumbar spine which appears worst   on the right at L5-S1.  Orders: Radiology Referral (Radiology) Neurosurgeon Referral (Neurosurgeon)  Problem # 2:  DIABETES MELLITUS, TYPE II, UNCONTROLLED (ICD-250.02) Assessment: Deteriorated poor dietary compliance Her updated medication list for this problem includes:    Diovan Hct 160-12.5 Mg Tabs (Valsartan-hydrochlorothiazide) ..... One by mouth once daily    Metformin Hcl 1000 Mg Tabs (Metformin hcl) .[name]... Take 1/2 tablet by mouth two times a day    Victoza 18 Mg/21ml Soln (Liraglutide) ..... Inject 1.2 mg subcutaneously once daily  Orders: T-Basic Metabolic Panel [phone]) T- Hemoglobin A1C (09811-91478) T-Urine Microalbumin w/creat. ratio [phone])  Problem # 3:  B12 DEFICIENCY (ICD-266.2)  Orders: T-Vitamin B12 (69629-52841) Vit B12 1000 mcg (J3420) Admin of Therapeutic Inj  intramuscular or subcutaneous (32440)  Problem # 4:  HYPERTENSION (ICD-401.9)  Her updated medication list for this problem includes:    Diovan Hct 160-12.5 Mg Tabs (Valsartan-hydrochlorothiazide) ..... One by mouth once daily    Metoprolol Succinate 50 Mg Xr24h-tab (Metoprolol succinate) ..... One by mouth qd  Complete Medication List: 1)  Crestor 10 Mg Tabs (Rosuvastatin calcium) .[name].. 1po qd 2)  Diovan Hct 160-12.5 Mg Tabs (Valsartan-hydrochlorothiazide) .... One by mouth once daily 3)  Plavix 75 Mg Tabs (Clopidogrel bisulfate) .[name].. 1 by mouth qd 4)  Metformin Hcl 1000 Mg Tabs (Metformin hcl) .... Take 1/2 tablet by mouth two times a day 5)  Accu-chek Aviva Strp (Glucose blood) .... For once daily testing 6)  Bd U/f Short Pen Needle 31g X 8 Mm Misc (Insulin pen needle) .... Use daily for victoza injection 7)  Metoprolol Succinate 50 Mg Xr24h-tab (Metoprolol succinate) .... One by mouth qd 8)  Victoza 18 Mg/84ml Soln  (Liraglutide) .... Inject 1.2 mg subcutaneously once daily 9)  Metaxalone 800 Mg Tabs (Metaxalone) .... One by mouth three times a day prn 10)  Lexapro 10 Mg Tabs (Escitalopram oxalate) .... One by mouth qd 11)  Alprazolam 0.25 Mg Tabs (Alprazolam) .... One by mouth once daily as needed 12)  Celebrex 50 Mg Caps (Celecoxib) .... Take 1 capsule by mouth once a day. 13)  Nascobal 500 Mcg/0.7ml Soln (Cyanocobalamin) .... 500 micrograms to one nostril q weekly  Patient Instructions: 1)  Please schedule a follow-up appointment in 1 month. Prescriptions: NASCOBAL 500 MCG/0.1ML SOLN (CYANOCOBALAMIN) 500 micrograms to one nostril q weekly  #1 month x 5   Entered and Authorized by:   [name] DO   Signed by:   [name] DO on 12/30/2009   Method  used:   Electronically to        [facility]  [phone]* (retail)       [address] [place], [place]  [address]       Ph: [phone] or [phone]       Fax: [phone]   RxID:   [id number]   Current Allergies (reviewed today): No known allergies     Medication Administration  Injection # 1:    Medication: Vit B12 1000 mcg    Diagnosis: B12 DEFICIENCY (ICD-266.2)    Route: IM    Site: L deltoid    Exp Date: 06/11/2011    Lot #: [id number]    Mfr: American Regent    Patient tolerated injection without complications    Given by: Mervin Kung CMA Duncan Dull) (December 27, 2009 4:58 PM)  Orders Added: 1)  T-Basic Metabolic Panel [phone] 2)  T- Hemoglobin A1C [83036-23375] 3)  T-Urine Microalbumin w/creat. ratio [[id number]] 4)  T-Vitamin B12 [82607-23330] 5)  Vit B12 1000 mcg [J3420] 6)  Admin of Therapeutic Inj  intramuscular or subcutaneous [96372] 7)  Radiology Referral [Radiology] 8)  Neurosurgeon Referral [Neurosurgeon] 9)  Est. Patient Level IV [25956]

## 2010-05-13 NOTE — Medication Information (Signed)
Summary: Prior Authorization for Lexapro/[employer]  Prior Authorization for Lexapro/[employer]   Imported By: Lanelle Bal 09/02/2009 11:20:10  _____________________________________________________________________  External Attachment:    Type:   Image     Comment:   External Document

## 2010-05-13 NOTE — Progress Notes (Signed)
Summary: Metformin Refill  Phone Note Refill Request Message from:  Fax from Pharmacy on May 03, 2009 11:53 AM  Refills Requested: Medication #1:  METFORMIN HCL 1000 MG  TABS Take 1/2 tablet by mouth two times a day   Dosage confirmed as above?Dosage Confirmed   Supply Requested: 3 months Initial call taken by: Michaelle Copas,  May 03, 2009 11:53 AM  Follow-up for Phone Call        ok for refill x 5 Follow-up by: [name] DO,  May 03, 2009 4:39 PM  Additional Follow-up for Phone Call Additional follow up Details #1::        rx sent electronically to pharmacy Additional Follow-up by: Glendell Docker CMA,  May 03, 2009 4:41 PM    Prescriptions: METFORMIN HCL 1000 MG  TABS (METFORMIN HCL) Take 1/2 tablet by mouth two times a day  #90 x 2   Entered by:   Glendell Docker CMA   Authorized by:   [name] DO   Signed by:   Glendell Docker CMA on 05/03/2009   Method used:   Electronically to        [facility]  [phone]* (retail)       [address] [place], [place]  [address]       Ph: [phone] or [phone]       Fax: [phone]   RxID:   [id number]

## 2010-05-13 NOTE — Consult Note (Signed)
Summary: [facility]  [facility]   Imported By: Lanelle Bal 01/31/2010 12:53:42  _____________________________________________________________________  External Attachment:    Type:   Image     Comment:   External Document

## 2010-05-13 NOTE — Assessment & Plan Note (Signed)
Summary: STREP THROAT/MHF   Vital Signs:  Patient profile:   60 year old female Weight:      180.50 pounds BMI:     34.51 O2 Sat:      97 % on Room air Temp:     98.1 degrees F oral Pulse rate:   87 / minute Pulse rhythm:   regular Resp:     18 per minute BP sitting:   112 / 70  (right arm) Cuff size:   large  Vitals Entered By: Glendell Docker CMA (November 22, 2009 9:13 AM)  O2 Flow:  Room air CC: Sinus Pressure Is Patient Diabetic? Yes Comments c/ o throat pain, denies pain with swallowing fatigue, sinus burning, Chest congestion no cough, nasal drip   Primary Care Provider:  Dondra Spry DO  CC:  Sinus Pressure.  History of Present Illness: [name] Bean is a 60 year old female who presents today with complaint  of of facial pain (burning sensation) in her cheeks.  Notes that the also has some irritation "above my uvula by my nose."  Feels very tired.  Denies fever.  Has not tried any OTC meds.  Denies cough.  + nasal discharge (clear).   Also notes some post nasal drip.    Preventive Screening-Counseling & Management  Alcohol-Tobacco     Smoking Status: quit  Allergies (verified): No Known Drug Allergies  Past History:  Past Medical History: Last updated: 08/16/2009 Diabetes mellitus, type II Hypertension Hyperlipidemia   Asthma   Coronary artery disease   Peripheral neuropathy  Depression Anxiety Diverticulosis, colon Colonic polyps, hx of History of right gluteus tear      Past Surgical History: Last updated: 08/16/2009 s/p PTCA s/p stent 2002  s/p c-spine surgury 2003            Family History: Last updated: 08/16/2009 mother with HTN and DM  father died at 57 yo with PNA, ETOH        Social History: Last updated: 08/16/2009 Former Smoker Alcohol use-no  Single  Occupation:  Production designer, 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 No apparent alopecia or balding. Ears:  R TM pink with mild bulging.  L TM normal. Mouth:  Mild pharyngeal erythema Neck:  No deformities, masses, or tenderness noted. Lungs:  Normal respiratory effort, chest expands symmetrically. Lungs are clear to auscultation, no crackles or wheezes. Heart:  Normal rate and regular rhythm. S1 and S2 normal without gallop, murmur, click, rub or other extra sounds.   Impression & Recommendations:  Problem # 1:  MAXILLARY SINUSITIS (ICD-473.0) Assessment New Suspect maxillary sinusitus and early R OM.  Will treat with amoxicillin.   Her updated medication list for this problem includes:    Amoxicillin 500 Mg Cap (Amoxicillin) .[name]... Take 1 capsule by mouth three times a day x 10 days  Complete Medication List: 1)  Crestor 10 Mg Tabs (Rosuvastatin calcium) .[name].. 1po qd 2)  Diovan Hct 160-12.5 Mg Tabs (Valsartan-hydrochlorothiazide) .... One by mouth once daily 3)  Plavix 75 Mg Tabs (Clopidogrel bisulfate) .[name].. 1 by mouth qd 4)  Metformin Hcl 1000 Mg Tabs (Metformin hcl) .... Take 1/2 tablet by mouth two times a  day 5)  Accu-chek Aviva Strp (Glucose blood) .... For once daily testing 6)  Bd U/f Short Pen Needle 31g X 8 Mm Misc (Insulin pen needle) .... Use daily for victoza injection 7)  Metoprolol Succinate 50 Mg Xr24h-tab (Metoprolol succinate) .... One by mouth qd 8)  Victoza 18 Mg/55ml Soln (Liraglutide) .... Inject 1.2 mg subcutaneously once daily 9)  Metaxalone 800 Mg Tabs (Metaxalone) .... One by mouth three times a day prn 10)  Lexapro 10 Mg Tabs (Escitalopram oxalate) .... One by mouth qd 11)  Alprazolam 0.25 Mg Tabs (Alprazolam) .... One by mouth once daily as needed 12)   Amoxicillin 500 Mg Cap (Amoxicillin) .... Take 1 capsule by mouth three times a day x 10 days  Patient Instructions: 1)  Call if you develop fever over 101, increasing sinus pressure, pain with eye movement, increased facial tenderness of swelling, or if you develop visual changes. Prescriptions: AMOXICILLIN 500 MG CAP (AMOXICILLIN) Take 1 capsule by mouth three times a day X 10 days  #30 x 0   Entered and Authorized by:   Lemont Fillers FNP   Signed by:   Lemont Fillers FNP on 11/22/2009   Method used:   Electronically to        [facility]  [phone]* (retail)       [address] [place], [place]  [address]       Ph: [phone] or [phone]       Fax: [phone]   RxID:   [id number]   Current Allergies (reviewed today): No known allergies

## 2010-05-13 NOTE — Progress Notes (Signed)
Summary: B12  results  Phone Note Outgoing Call   Summary of Call: Pls call [name] Bean and let her know that her B12 levels are low.  She should return for B12 injections once weekly x 1 month, then once monthly.  (f/u B12 level in 1 month) Initial call taken by: Lemont Fillers FNP,  June 10, 2009 5:35 PM  Follow-up for Phone Call        attempted to contact patient at [phone], no answer,  voice recording reach stating patient is unable to accept message at this time, mailbox is full, call placed to [phone] patient was unavailable , detailed voice message left advising patient per [name] Bean instructions. Follow-up by: Glendell Docker CMA,  June 11, 2009 9:15 AM  New Problems: B12 DEFICIENCY (ICD-266.2)   New Problems: B12 DEFICIENCY (ICD-266.2)

## 2010-05-13 NOTE — Progress Notes (Signed)
Summary: refill plavix--needs appt  Phone Note Refill Request Message from:  [facility] on September 13, 2009 10:40 AM  Refills Requested: Medication #1:  PLAVIX 75 MG  TABS 1 by mouth qd   Dosage confirmed as above?Dosage Confirmed   Supply Requested: 1 month   Last Refilled: 08/08/2009 Pt is due for follow up with Dr. Artist Pais now. Left message on home voicemail to return my call.   Next Appointment Scheduled: none Initial call taken by: Mervin Kung CMA,  September 13, 2009 10:41 AM  Follow-up for Phone Call        Left message on machine to return my call. Mervin Kung CMA  September 17, 2009 10:40 AM   Additional Follow-up for Phone Call Additional follow up Details #1::        attempted to contact patient at  [phone], routed to voice mail, detailed voice message left informing patient rx sent to pharmacy, and follow appointment is needed Additional Follow-up by: Glendell Docker CMA,  September 18, 2009 10:40 AM    Prescriptions: PLAVIX 75 MG  TABS (CLOPIDOGREL BISULFATE) 1 by mouth qd  #30 x 0   Entered by:   Mervin Kung CMA   Authorized by:   [name] DO   Signed by:   Mervin Kung CMA on 09/13/2009   Method used:   Electronically to        [facility]  [phone]* (retail)       [address] [place], [place]  [address]       Ph: [phone] or [phone]       Fax: [phone]   RxID:   [id number]

## 2010-05-13 NOTE — Assessment & Plan Note (Signed)
Summary: talk to dr yoo/mhf rsc with pt/mhf   Vital Signs:  Patient profile:   60 year old female Height:      60.75 inches Weight:      174 pounds BMI:     33.27 Temp:     98.1 degrees F oral Pulse rate:   84 / minute Pulse rhythm:   regular Resp:     18 per minute BP sitting:   128 / 80  (right arm) Cuff size:   regular  Vitals Entered By: Mervin Kung CMA (Aug 16, 2009 8:19 AM) Is Patient Diabetic? Yes   Primary Care Provider:  Dondra Spry DO   History of Present Illness: 60 y/o with diabetes for follow up co workers have noticed pt much more irritable,  having panic attack at work she did not check her blood sugar  she is also exp some vaginal burning and thinks she may have a yeast infection.  stress at work. she is looking to move to [facility].  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   Lungs:  normal respiratory effort and normal breath sounds.   Heart:  normal rate, regular rhythm, and no gallop.   Extremities:  No lower extremity edema Neurologic:  cranial nerves II-XII intact and gait normal.     Impression & Recommendations:  Problem # 1:  ANXIETY (ICD-300.00) We discussed possbility of hypoglycemia causing anxiety / irritability. stop glimepiride.  pt advised to check blood sugar when she  has acute symptoms If blood sugars normal, use alprazolam as needed  The following medications were removed from the medication list:    Fluoxetine Hcl 10 Mg Tabs (Fluoxetine hcl) ..... One by mouth once daily Her updated medication list for this problem includes:    Lexapro 10 Mg Tabs (Escitalopram oxalate) ..... One by mouth qd    Alprazolam 0.25 Mg Tabs (Alprazolam) ..... One by mouth once daily as needed  Problem # 2:  CANDIDIASIS, VAGINAL (ICD-112.1) consider potential drug interactions with fluconazole. use OTC vaginal creams  Complete Medication List: 1)  Crestor 10 Mg Tabs (Rosuvastatin calcium) .[name].. 1po qd 2)  Diovan Hct 160-12.5 Mg Tabs (Valsartan-hydrochlorothiazide) .... One by mouth once daily 3)  Plavix 75 Mg Tabs (Clopidogrel bisulfate) .[name].. 1 by mouth qd 4)  Metformin Hcl 1000 Mg Tabs (Metformin hcl) .... Take 1/2 tablet by mouth two times a day 5)  Accu-chek Aviva Strp (Glucose blood) .... For once daily testing 6)  Bd U/f Short Pen Needle 31g X 8 Mm Misc (Insulin pen needle) .... Use daily for victoza injection 7)  Metoprolol Succinate 50 Mg Xr24h-tab (Metoprolol succinate) .... One by mouth qd 8)  Victoza 18 Mg/38ml Soln (Liraglutide) .... Inject  1.2 mg subcutaneously once daily 9)  Metaxalone 800 Mg Tabs (Metaxalone) .... One by mouth three times a day prn 10)  Lexapro 10 Mg Tabs (Escitalopram oxalate) .... One by mouth qd 11)  Alprazolam 0.25 Mg Tabs (Alprazolam) .... One by mouth once daily as needed  Patient Instructions: 1)  Please schedule a follow-up appointment in 1 month. 2)  Use over the counter vaginal cream for possible yeast infection Prescriptions: ALPRAZOLAM 0.25 MG TABS (ALPRAZOLAM) one by mouth once daily as needed  #30 x 0   Entered and Authorized by:   [name] DO   Signed by:   [name] DO on 08/16/2009   Method used:   Print then Give to Patient   RxID:   [id number] LEXAPRO 10 MG TABS (ESCITALOPRAM OXALATE) one by mouth qd  #30  x 2   Entered and Authorized by:   [name] DO   Signed by:   [name] DO on 08/16/2009   Method used:   Electronically to        [facility]  [phone]* (retail)       [address] [place], [place]  [address]       Ph: [phone] or [phone]       Fax: [phone]   RxID:   [id number]   Current Allergies (reviewed today): No known allergies

## 2010-05-13 NOTE — Progress Notes (Signed)
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  appears similar to prev MRI.  If she has not seen a neurosurgeon yet,  we should consider neurologist evaluation first.  Initial call taken by: [name] DO,  January 03, 2010 1:27 PM  Follow-up for Phone Call        attempted to contact patient at [phone], no answer, voice message left for patient to return call Follow-up by: Glendell Docker CMA,  January 03, 2010 4:50 PM  Additional Follow-up for Phone Call Additional follow up Details #1::        call placed to patient at [phone], she has been advised per Dr Artist Pais instructions. She states she was informed of the appointment for next week with the neurologist. Additional Follow-up by: Glendell Docker CMA,  January 06, 2010 8:46 AM

## 2010-05-13 NOTE — Assessment & Plan Note (Signed)
Summary: f/u about Diabetes control, & prozac- jr   Vital Signs:  Patient profile:   60 year old female Weight:      182 pounds O2 Sat:      98 % Pulse rate:   79 / minute BP sitting:   130 / 70  Primary Care Provider:  Dondra Spry DO  CC:  Type 2 diabetes mellitus follow-up.  History of Present Illness:  Type 2 Diabetes Mellitus Follow-Up      This is a 60 year old woman who presents for Type 2 diabetes mellitus follow-up.  The patient denies self managed hypoglycemia and hypoglycemia requiring help.  The patient denies the following symptoms: chest pain.  Since the last visit the patient reports compliance with medications.  Dietary compliance has improved.  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   Lungs:  normal respiratory effort, normal breath sounds, and no wheezes.   Heart:  normal rate, regular rhythm, no murmur, and no gallop.   Extremities:  No lower extremity edema    Impression & Recommendations:  Problem # 1:  DIABETES MELLITUS, TYPE II, UNCONTROLLED (ICD-250.02) A1c much worse.  Poor dietary compliance.  start victoza.  instructions reviewed.  Pt understands to reduce dose of glimepiride if she experiences hypoglycemia.  Her updated medication list for this problem includes:    Diovan Hct 160-12.5 Mg Tabs  (Valsartan-hydrochlorothiazide) ..... One by mouth once daily    Glimepiride 2 Mg Tabs (Glimepiride) .[name]... Take 1 tablet by mouth two times a day    Metformin Hcl 1000 Mg Tabs (Metformin hcl) .[name]... Take 1/2 tablet by mouth two times a day    Victoza 18 Mg/27ml Soln (Liraglutide) ..... Inject 1.2 mg subcutaneously once daily  Labs Reviewed: Creat: 0.63 (03/25/2009)     Last Eye Exam: BDR (01/31/2008) Reviewed HgBA1c results: 10.3 (03/25/2009)  7.9 (09/05/2008)  Complete Medication List: 1)  Crestor 10 Mg Tabs (Rosuvastatin calcium) .[name].. 1po qd 2)  Diovan Hct 160-12.5 Mg Tabs (Valsartan-hydrochlorothiazide) .... One by mouth once daily 3)  Glimepiride 2 Mg Tabs (Glimepiride) .... Take 1 tablet by mouth two times a day 4)  Plavix 75 Mg Tabs (Clopidogrel bisulfate) .[name].. 1 by mouth qd 5)  Metformin Hcl 1000 Mg Tabs (Metformin hcl) .... Take 1/2 tablet by mouth two times a day 6)  Accu-chek Aviva Strp (Glucose blood) .... For once daily testing 7)  Bd U/f Short Pen Needle 31g X 8 Mm Misc (Insulin pen needle) .... Use daily for victoza injection 8)  Metoprolol Succinate 50 Mg Xr24h-tab (Metoprolol succinate) .... One by mouth qd 9)  Promethazine Hcl 25 Mg Tabs (Promethazine hcl) .[name].. 1 tab by mouth q 6 hrs as needed nausea 10)  Fluoxetine Hcl 10 Mg Tabs (Fluoxetine hcl) .Marland KitchenMarland KitchenMarland Kitchen  One by mouth once daily 11)  Victoza 18 Mg/69ml Soln (Liraglutide) .... Inject 1.2 mg subcutaneously once daily  Patient Instructions: 1)  Please schedule a follow-up appointment in 2 months. 2)  BMP prior to visit, ICD-9: 250.02 3)  HbgA1C prior to visit, ICD-9: 250.02 4)  Please return for lab work one (1) week before your next appointment.  Prescriptions: FLUOXETINE HCL 10 MG TABS (FLUOXETINE HCL) one by mouth once daily  #30 x 3   Entered and Authorized by:   [name] DO   Signed by:   [name] DO on 04/15/2009   Method used:   Electronically to        [facility]  [phone]* (retail)       [address] [place], [place]  [address]       Ph: [phone] or [phone]       Fax: [phone]   RxID:   [id number] METOPROLOL SUCCINATE 50 MG XR24H-TAB (METOPROLOL SUCCINATE) one by mouth qd  #30 Tablet x 5   Entered and Authorized by:   [name] DO   Signed by:   [name] DO on 04/15/2009   Method used:   Electronically to        [facility]  [phone]* (retail)       [address] [place], [place]  [address]       Ph: [phone] or [phone]       Fax: [phone]   RxID:   [id number] ACCU-CHEK AVIVA  STRP (GLUCOSE BLOOD) for once daily testing  #100 x 3   Entered and Authorized by:   [name] DO   Signed by:   [name] DO on 04/15/2009   Method used:   Electronically to        [facility]  [phone]* (retail)       [address] [place], [place]  [address]       Ph: [phone] or [phone]       Fax: [phone]   RxID:   [id number] DIOVAN HCT 160-12.5 MG  TABS (VALSARTAN-HYDROCHLOROTHIAZIDE) one by mouth once daily  #30 Tablet x 5   Entered and Authorized by:   [name] DO   Signed by:   [name] DO on 04/15/2009   Method used:   Electronically to        [facility]  [phone]* (retail)       [address] [place], [place]  [address]       Ph: [phone] or [phone]       Fax: [phone]   RxID:   [id number] BD U/F SHORT PEN NEEDLE 31G X 8 MM MISC (INSULIN PEN NEEDLE) use daily for Victoza injection  #100 x 3   Entered and Authorized by:   [name] DO   Signed by:   [name] DO on 04/15/2009   Method used:   Electronically to        [facility]  [phone]* (retail)       [address] [place], [place]  [address]       Ph: [phone] or [phone]       Fax: [phone]   RxID:   [id number] VICTOZA 18 MG/3ML SOLN (LIRAGLUTIDE)  inject 1.2 mg Subcutaneously once daily  #1 month x 3   Entered and Authorized by:   [name] DO    Signed by:   [name] DO on 04/15/2009   Method used:   Electronically to        [facility]  [phone]* (retail)       [address] [place], [place]  [address]       Ph: [phone] or [phone]       Fax: [phone]   RxID:   [id number]

## 2010-05-13 NOTE — Assessment & Plan Note (Signed)
Summary: wants follow up over due/mt  Medications Added CELEBREX 50 MG CAPS (CELECOXIB) Take 1 capsule by mouth once a day.prn      Allergies Added: NKDA  Primary Provider:  Dondra Spry DO  CC:  no complaints.  History of Present Illness: [name] Bean is seen today for F/U of lipids, CAD and HTN.  She has a historhy of CAD with previous stent RCA on chronic Plavix.  Cannot take asa from stomach upset.  Also has occluded/collateralized D1.  No SSCP.  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  Quit smoking in 2008.  Denies SSCP, dyspnea, palpitations, syncope or edema  Compliant with meds  Current Problems (verified): 1)  Coronary Artery Disease  (ICD-414.00) 2)  Hyperlipidemia  (ICD-272.4) 3)  Hypertension  (ICD-401.9) 4)  Hypercholesterolemia  (ICD-272.0) 5)  Spinal Stenosis, Lumbar  (ICD-724.02) 6)  Maxillary Sinusitis  (ICD-473.0) 7)  Candidiasis, Vaginal  (ICD-112.1) 8)  B12 Deficiency  (ICD-266.2) 9)  Anemia  (ICD-285.9) 10)  Gastroenteritis, Acute  (ICD-558.9) 11)  Cerumen Impaction, Left  (ICD-380.4) 12)  Nausea  (ICD-787.02) 13)  Back Pain, Lumbar  (ICD-724.2) 14)  Fatigue  (ICD-780.79) 15)  Gastroenteritis  (ICD-558.9) 16)  Knee Pain, Left  (ICD-719.46) 17)  Menopause-related Vasomotor Symptoms, Hot Flashes  (ICD-627.2) 18)  Bursitis, Left Hip  (ICD-726.5) 19)  Tubulovillous Adenoma, Colon  (ICD-211.3) 20)  Otitis Media, Acute, Left  (ICD-382.9) 21)  Diverticulosis, Colon  (ICD-562.10) 22)  Anxiety  (ICD-300.00) 23)  Depression  (ICD-311) 24)  Peripheral Neuropathy  (ICD-356.9) 25)  Asthma  (ICD-493.90) 26)  Diabetes Mellitus, Type II, Uncontrolled  (ICD-250.02) 27)  Bronchitis  (ICD-490)  Current Medications (verified): 1)  Crestor 10 Mg Tabs (Rosuvastatin Calcium) .[name].. 1po Qd 2)  Diovan Hct 160-12.5 Mg  Tabs (Valsartan-Hydrochlorothiazide) .... One By Mouth Once Daily 3)  Plavix 75 Mg  Tabs (Clopidogrel Bisulfate) .[name].. 1 By Mouth Qd 4)  Metformin Hcl 1000 Mg  Tabs (Metformin  Hcl) .... Take 1/2 Tablet By Mouth Two Times A Day 5)  Accu-Chek Aviva  Strp (Glucose Blood) .... For Once Daily Testing 6)  Bd U/f Short Pen Needle 31g X 8 Mm Misc (Insulin Pen Needle) .... Use Daily For Victoza Injection 7)  Metoprolol Succinate 50 Mg Xr24h-Tab (Metoprolol Succinate) .... One By Mouth Qd 8)  Victoza 18 Mg/69ml Soln (Liraglutide) .... Inject 1.2 Mg Subcutaneously Once Daily 9)  Lexapro 10 Mg Tabs (Escitalopram Oxalate) .... One By Mouth Qd 10)  Celebrex 50 Mg Caps (Celecoxib) .... Take 1 Capsule By Mouth Once A Day.prn 11)  Nascobal 500 Mcg/0.60ml Soln (Cyanocobalamin) .... 500 Micrograms To One Nostril Q Weekly  Allergies (verified): No Known Drug Allergies  Past History:  Past Medical History: Last updated: 12/27/2009 Diabetes mellitus, type II Hypertension Hyperlipidemia   Asthma   Coronary artery disease   Peripheral neuropathy  Depression  Anxiety Diverticulosis, colon Colonic polyps, hx of History of right gluteus tear     History of low back pain   Past Surgical History: Last updated: 08/16/2009 s/p PTCA s/p stent 2002  s/p c-spine surgury 2003            Family History: Last updated: 08/16/2009 mother with HTN and DM  father died at 58 yo with PNA, ETOH        Social History: Last updated: 12/27/2009 Former Smoker Alcohol use-no   Single  Occupation:  Production designer, 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   Vital Signs:  Patient profile:   60 year old female Height:      60 inches Weight:      179 pounds BMI:     35.08 Pulse rate:   92 / minute Resp:     18 per minute BP sitting:   130 / 80  (left arm)  Vitals Entered By: Kem Parkinson (January 29, 2010 11:35 AM)  Physical Exam  General:  Affect  appropriate Healthy:  appears stated age HEENT: normal Neck supple with no adenopathy JVP normal no bruits no thyromegaly Lungs clear with no wheezing and good diaphragmatic motion Heart:  S1/S2 no murmur,rub, gallop or click PMI normal Abdomen: benighn, BS positve, no tenderness, no AAA no bruit.  No HSM or HJR Distal pulses intact with no bruits No edema Neuro non-focal Skin warm and dry Anterior cervical neck scar   Impression & Recommendations:  Problem # 1:  CORONARY ARTERY DISEASE (ICD-414.00) Stable no angina  Continue BB and antiplatlet Her updated medication list for this problem includes:    Plavix 75 Mg Tabs (Clopidogrel bisulfate) .[name]... 1 by mouth qd    Metoprolol Succinate 50 Mg Xr24h-tab (Metoprolol succinate) ..... One by mouth qd  Orders: EKG w/ Interpretation (93000)  Problem # 2:  HYPERLIPIDEMIA (ICD-272.4) At goal with no side effects Her updated medication list for this problem includes:    Crestor 10 Mg Tabs (Rosuvastatin calcium) .[name]... 1po qd  CHOL: 110 (06/28/2008)   LDL: 44 (06/28/2008)   HDL: 39.90 (06/28/2008)   TG: 130.0 (06/28/2008)  Problem # 3:  HYPERTENSION (ICD-401.9) Well controlled.   Her updated medication list for this problem includes:    Diovan Hct 160-12.5 Mg Tabs (Valsartan-hydrochlorothiazide) ..... One by mouth once daily    Metoprolol Succinate 50 Mg Xr24h-tab (Metoprolol succinate) ..... One by mouth qd  Problem # 4:  B12 DEFICIENCY (ICD-266.2) Will go back to monthly IM shots as inhaled Rx too expensive.  F/U HCT MCV per Dr Artist Pais  Patient Instructions: 1)  Your physician recommends that you schedule a follow-up appointment in: YEAR WITH DR [name] 2)  Your physician recommends that you continue on your current medications as directed. Please refer to the Current Medication list given to you today.   EKG Report  Procedure date:  01/29/2010  Findings:      NSR 92 Poor R wave progression LAE No changes from 2009

## 2010-05-13 NOTE — Progress Notes (Signed)
Summary: Metformin Refill  Phone Note Refill Request Message from:  Fax from Pharmacy on Aug 21, 2009 4:51 PM  Refills Requested: Medication #1:  METFORMIN HCL 1000 MG  TABS Take 1/2 tablet by mouth two times a day   Dosage confirmed as above?Dosage Confirmed   Brand Name Necessary? No   Supply Requested: 1 month  Method Requested: Electronic Next Appointment Scheduled: No future appointment Initial call taken by: Glendell Docker CMA,  Aug 21, 2009 4:52 PM  Follow-up for Phone Call        Rx completed in Dr. Tiajuana Amass Follow-up by: Glendell Docker CMA,  Aug 21, 2009 4:52 PM    Prescriptions: METFORMIN HCL 1000 MG  TABS (METFORMIN HCL) Take 1/2 tablet by mouth two times a day  #30 x 5   Entered by:   Glendell Docker CMA   Authorized by:   [name] DO   Signed by:   Glendell Docker CMA on 08/21/2009   Method used:   [facility] to        [facility]  [phone]* (retail)       [address] [place], [place]  [address]       Ph: [phone] or [phone]       Fax: [phone]   RxID:   [id number]

## 2010-05-13 NOTE — Assessment & Plan Note (Signed)
Summary: b12 injection- jr  Nurse Visit   Allergies: No Known Drug Allergies  Medication Administration  Injection # 1:    Medication: Vit B12 1000 mcg    Diagnosis: B12 DEFICIENCY (ICD-266.2)    Route: IM    Site: L deltoid    Exp Date: 03/13/2011    Lot #: [id number]    Mfr: American Regent    Patient tolerated injection without complications    Given by: Glendell Docker CMA (June 20, 2009 9:07 AM)  Orders Added: 1)  Vit B12 1000 mcg [J3420] 2)  Admin of Therapeutic Inj  intramuscular or subcutaneous [96372]   Medication Administration  Injection # 1:    Medication: Vit B12 1000 mcg    Diagnosis: B12 DEFICIENCY (ICD-266.2)    Route: IM    Site: L deltoid    Exp Date: 03/13/2011    Lot #: [id number]    Mfr: American Regent    Patient tolerated injection without complications    Given by: Glendell Docker CMA (June 20, 2009 9:07 AM)  Orders Added: 1)  Vit B12 1000 mcg [J3420] 2)  Admin of Therapeutic Inj  intramuscular or subcutaneous [[id number]]

## 2010-05-13 NOTE — Progress Notes (Signed)
Summary: med mix up  Phone Note Call from Patient   Caller: pt live Call For: [name] DO Summary of Call: Patient called to advise her medication was not a change from Janumet to Victoza but from Byetta to Victoza and she shouldl be on the Janumet. She was on Metformin and was told by Dr Artist Pais that he was changing her med to [facility]. [facility]   Please call the patient [phone] Initial call taken by: Roselle Locus,  May 01, 2009 3:47 PM  Follow-up for Phone Call        Please advise pt to take metfomin and victoza as directed.    no janumet or [place] when using victoza Follow-up by: [name] DO,  May 02, 2009 12:12 PM  Additional Follow-up for Phone Call Additional follow up Details #1::        Left pt. a detailed mess. informing to only take Metformin and Victoza as directed. No Janumet or Januvia when using Victoza. I did ask pt. to call me back to make sure she understands what to take.[name] Bean  May 03, 2009 9:11 AM

## 2010-05-13 NOTE — Progress Notes (Signed)
Summary: prior auth--lexapro  Phone Note Outgoing Call   Call placed by: Mervin Kung, [facility]) Call placed to: Insurer Summary of Call: [facility] that [facility]ro requires prior auth.  Called [employer] [phone]. Per Junious Silk. Lexapro is non-covered, non-preferred. Preferred alternatives are Citalopram, Fluoxetine, Paroxetine and Sertraline. Advised Ruby that pt. has tried Fluoxetine.  Case # [id number] was initiated and P.A. form is being faxed.  Left message on machine for pt. to return my call to notify her of the P.A. process.  Mervin Kung CMA  Aug 23, 2009 11:49 AM   Follow-up for Phone Call        Prior Authorization questionaire recieved, awaiting physician signature Follow-up by: Glendell Docker CMA,  Aug 27, 2009 8:18 AM  Additional Follow-up for Phone Call Additional follow up Details #1::        form faxed awaiting approval status Additional Follow-up by: Glendell Docker CMA,  Aug 27, 2009 3:00 PM    Additional Follow-up for Phone Call Additional follow up Details #2::    medication approved from 08/06/2009 through 04/12/2098 Follow-up by: Glendell Docker CMA,  Aug 28, 2009 9:02 AM

## 2010-05-13 NOTE — Progress Notes (Signed)
Summary: Plavix Refill--lm  Phone Note Refill Request Message from:  Fax from Pharmacy on Sep 10, 2009 9:24 AM  Refills Requested: Medication #1:  PLAVIX 75 MG  TABS 1 by mouth qd   Dosage confirmed as above?Dosage Confirmed   Brand Name Necessary? No   Supply Requested: 1 month   Last Refilled: 08/08/2009 Call was placed to patient at [phone], no answer, voice message for patient to return call to verify if she would like rx to go to local pharmacy or mail order   Method Requested: Electronic Next Appointment Scheduled: None Initial call taken by: Glendell Docker CMA,  Sep 10, 2009 9:25 AM  Follow-up for Phone Call        Left message on machine to return my call. Nicki Guadalajara Fergerson CMA Duncan Dull)  September 11, 2009 9:34 AM   Additional Follow-up for Phone Call Additional follow up Details #1::        no return call from patient will honor local rx if patient calls for refill Additional Follow-up by: Glendell Docker CMA,  September 12, 2009 8:40 AM

## 2010-05-13 NOTE — Progress Notes (Signed)
Summary: Metoprolol Refill  Phone Note Refill Request Message from:  Fax from Pharmacy on February 10, 2010 8:18 AM  Refills Requested: Medication #1:  METOPROLOL SUCCINATE 50 MG XR24H-TAB one by mouth qd   Dosage confirmed as above?Dosage Confirmed   Brand Name Necessary? No   Supply Requested: 1 month   Last Refilled: 01/07/2010  Method Requested: Electronic Next Appointment Scheduled: none Initial call taken by: Roselle Locus,  February 10, 2010 8:18 AM  Follow-up for Phone Call        Rx completed in Dr. Tiajuana Amass Follow-up by: Glendell Docker CMA,  February 10, 2010 8:24 AM    Prescriptions: METOPROLOL SUCCINATE 50 MG XR24H-TAB (METOPROLOL SUCCINATE) one by mouth qd  #30 x 2   Entered by:   Glendell Docker CMA   Authorized by:   [name] DO   Signed by:   Glendell Docker CMA on 02/10/2010   Method used:   Electronically to        [facility]  [phone]* (retail)       [address] [place], [place]  [address]       Ph: [phone] or [phone]       Fax: [phone]   RxID:   [id number]

## 2010-05-13 NOTE — Medication Information (Signed)
Summary: Approval for Lexapro/[employer]  Approval for Lexapro/[employer]   Imported By: Lanelle Bal 09/04/2009 12:00:40  _____________________________________________________________________  External Attachment:    Type:   Image     Comment:   External Document

## 2010-05-13 NOTE — Assessment & Plan Note (Signed)
Summary: b12 injection-jr  Nurse Visit   Allergies: No Known Drug Allergies  Medication Administration  Injection # 1:    Medication: Vit B12 1000 mcg    Diagnosis: B12 DEFICIENCY (ICD-266.2)    Route: IM    Site: R deltoid    Exp Date: 03/13/2011    Lot #: [id number]    Mfr: American Regent    Patient tolerated injection without complications    Given by: Glendell Docker CMA (July 04, 2009 9:30 AM)  Orders Added: 1)  Vit B12 1000 mcg [J3420] 2)  Admin of Therapeutic Inj  intramuscular or subcutaneous [96372]   Medication Administration  Injection # 1:    Medication: Vit B12 1000 mcg    Diagnosis: B12 DEFICIENCY (ICD-266.2)    Route: IM    Site: R deltoid    Exp Date: 03/13/2011    Lot #: [id number]    Mfr: American Regent    Patient tolerated injection without complications    Given by: Glendell Docker CMA (July 04, 2009 9:30 AM)  Orders Added: 1)  Vit B12 1000 mcg [J3420] 2)  Admin of Therapeutic Inj  intramuscular or subcutaneous [[id number]]

## 2010-05-13 NOTE — Progress Notes (Signed)
Summary: RX FOR JANUMET  Phone Note Refill Request Message from:  Fax from Pharmacy on April 16, 2009 1:21 PM  Refills Requested: Medication #1:  JANUMET   Dosage confirmed as above?Dosage Confirmed   Brand Name Necessary? No   Supply Requested: 1 month FAX STATES THEY DID NOT RECEIVE THIS RX    Method Requested: Electronic Next Appointment Scheduled: NONE Initial call taken by: Roselle Locus,  April 16, 2009 1:21 PM  Follow-up for Phone Call        pharmacist was informed patient is no longer taking this medication it was changed to Victoza Follow-up by: Glendell Docker CMA,  April 16, 2009 5:15 PM

## 2010-05-15 NOTE — Assessment & Plan Note (Signed)
Summary: foot swollen/mhf   Vital Signs:  Patient profile:   60 year old female Height:      60 inches Weight:      183 pounds BMI:     35.87 O2 Sat:      100 % on Room air Temp:     98.0 degrees F oral Pulse rate:   85 / minute Resp:     20 per minute BP sitting:   132 / 70  (right arm) Cuff size:   large  Vitals Entered By: Glendell Docker CMA (March 27, 2010 10:57 AM)  O2 Flow:  Room air CC: foot swollen, Type 2 diabetes mellitus follow-up, Headache Is Patient Diabetic? Yes Did you bring your meter with you today? No Pain Assessment Patient in pain? no        Primary Care Provider:  Dondra Spry DO  CC:  foot swollen, Type 2 diabetes mellitus follow-up, and Headache.  History of Present Illness:  Hypertension Follow-Up      This is a 60 year old woman who presents for Hypertension follow-up.  The patient reports edema.  The patient denies the following associated symptoms: chest pain.  Compliance with medications (by patient report) has been near 100%.  The patient reports that dietary compliance has been fair.    Type 2 Diabetes Mellitus Follow-Up      The patient is also here for Type 2 diabetes mellitus follow-up.  The patient reports weight gain, but denies self managed hypoglycemia.  The patient denies the following symptoms: chest pain.  Since the last visit the patient reports poor dietary compliance, noncompliance with medications, not exercising regularly, and not monitoring blood glucose.    Preventive Screening-Counseling & Management  Alcohol-Tobacco     Smoking Status: quit  Allergies (verified): No Known Drug Allergies  Past History:  Past Medical History: CORONARY ARTERY DISEASE HYPERLIPIDEMIA  HYPERTENSION  HYPERCHOLESTEROLEMIA   SPINAL STENOSIS, LUMBAR MAXILLARY SINUSITIS  CANDIDIASIS, VAGINAL  B12 DEFICIENCY ANEMIA  GASTROENTERITIS, ACUTE CERUMEN IMPACTION, LEFT  NAUSEA BACK PAIN, LUMBAR  FATIGUE GASTROENTERITIS  KNEE PAIN,  LEFT MENOPAUSE-RELATED VASOMOTOR SYMPTOMS, HOT FLASHES  BURSITIS, LEFT HIP TUBULOVILLOUS ADENOMA, COLON  OTITIS MEDIA, ACUTE, LEFT  DIVERTICULOSIS, COLON  ANXIETY DEPRESSION PERIPHERAL NEUROPATHY  ASTHMA DIABETES MELLITUS, TYPE II, UNCONTROLLED  BRONCHITIS  Colonic polyps, hx of History of right gluteus tear      Past Surgical History: CARDIAC CATHETERIZATION 2009,2006  s/p PTCA s/p stent 2002   s/p c-spine surgury 2003             Family History: mother with HTN and DM  father died at 72 yo with PNA, ETOH         Social History: Former Smoker Alcohol use-no    Single  Occupation:  Production designer, 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   Lungs:  Normal respiratory effort, chest expands symmetrically. Lungs are clear to auscultation, no crackles or wheezes. Heart:  normal rate and no gallop.   Extremities:  1+ left pedal edema and 1+ right pedal edema.   Neurologic:  cranial nerves II-XII intact.   slight decrease in sensation of temp and vibration   Impression & Recommendations:  Problem # 1:  HYPERTENSION (ICD-401.9) Assessment Deteriorated lower ext edema.  add lasix  Her updated medication list for this problem includes:    Diovan Hct 160-12.5 Mg Tabs (Valsartan-hydrochlorothiazide) ..... One by mouth once daily  Metoprolol Succinate 50 Mg Xr24h-tab (Metoprolol succinate) ..... One by mouth qd    Furosemide 20 Mg Tabs (Furosemide) ..... One by mouth once daily  BP today: 132/70 Prior BP: 130/80 (01/29/2010)  Labs Reviewed: K+: 4.0 (12/27/2009) Creat: : 0.57 (12/27/2009)   Chol: 110 (06/28/2008)   HDL: 39.90 (06/28/2008)   LDL: 44 (06/28/2008)   TG: 130.0 (06/28/2008)  Problem # 2:  DIABETES MELLITUS, TYPE II, UNCONTROLLED (ICD-250.02) Assessment: Deteriorated poor compliance.  pt starting to develop peripheral neuropathy.  pt reluctant to start insulin.   pt motivated to improve dietary  compliance  Her updated medication list for this problem includes:    Diovan Hct 160-12.5 Mg Tabs (Valsartan-hydrochlorothiazide) ..... One by mouth once daily    Metformin Hcl 1000 Mg Tabs (Metformin hcl) .[name]... Take 1/2 tablet by mouth two times a day    Victoza 18 Mg/27ml Soln (Liraglutide) ..... Inject 1.2 mg subcutaneously once daily  Orders: T-Basic Metabolic Panel [phone]) T- Hemoglobin A1C [phone])  Labs Reviewed: Creat: 0.57 (12/27/2009)     Last Eye Exam: BDR (01/31/2008) Reviewed HgBA1c results: 9.6 (12/27/2009)  7.0 (06/27/2009)  Complete Medication List: 1)  Crestor 10 Mg Tabs (Rosuvastatin calcium) .[name].. 1po qd 2)  Diovan Hct 160-12.5 Mg Tabs (Valsartan-hydrochlorothiazide) .... One by mouth once daily 3)  Plavix 75 Mg Tabs (Clopidogrel bisulfate) .[name].. 1 by mouth qd 4)  Metformin Hcl 1000 Mg Tabs (Metformin hcl) .... Take 1/2 tablet by mouth two times a day 5)  Accu-chek Aviva Strp (Glucose blood) .... For once daily testing 6)  Bd U/f Short Pen Needle 31g X 8 Mm Misc (Insulin pen needle) .... Use daily for victoza injection 7)  Metoprolol Succinate 50 Mg Xr24h-tab (Metoprolol succinate) .... One by mouth qd 8)  Victoza 18 Mg/11ml Soln (Liraglutide) .... Inject 1.2 mg subcutaneously once daily 9)  Lexapro 10 Mg Tabs (Escitalopram oxalate) .... One by mouth qd 10)  Celebrex 50 Mg Caps (Celecoxib) .... Take 1 capsule by mouth once a day.prn 11)  Nascobal 500 Mcg/0.68ml Soln (Cyanocobalamin) .... 500 micrograms to one nostril q weekly 12)  Furosemide 20 Mg Tabs (Furosemide) .... One by mouth once daily  Other Orders: T-Hepatic Function [id number]) T-Lipid Profile [phone])   Patient Instructions: 1)  Please schedule a follow-up appointment in 2 months. Prescriptions: FUROSEMIDE 20 MG TABS (FUROSEMIDE) one by mouth once daily  #30 x 3   Entered and Authorized by:   [name] DO   Signed by:   [name] DO on 03/27/2010   Method used:    [facility] to        [facility]  [phone]* (retail)       [address] [place], [place]  [address]       Ph: [phone] or [phone]       Fax: [phone]   RxID:   [id number]    Orders Added: 1)  T-Basic Metabolic Panel [phone] 2)  T- Hemoglobin A1C [[id number]] 3)  T-Hepatic Function [[id number]] 4)  T-Lipid Profile [80061-22930] 5)  Est. Patient Level III [22025]     Current Allergies (reviewed today): No known allergies    Preventive Care Screening  Pap Smear:    Date:  02/25/2010    Results:  normal

## 2010-05-20 ENCOUNTER — Encounter: Payer: Self-pay | Admitting: Internal Medicine

## 2010-05-20 ENCOUNTER — Ambulatory Visit (INDEPENDENT_AMBULATORY_CARE_PROVIDER_SITE_OTHER): Payer: BC Managed Care – PPO | Admitting: Internal Medicine

## 2010-05-20 DIAGNOSIS — I10 Essential (primary) hypertension: Secondary | ICD-10-CM

## 2010-05-20 DIAGNOSIS — R1084 Generalized abdominal pain: Secondary | ICD-10-CM

## 2010-05-20 LAB — CONVERTED CEMR LAB
ALT: 8 units/L (ref 0–35)
AST: 10 units/L (ref 0–37)
Albumin: 4.9 g/dL (ref 3.5–5.2)
Alkaline Phosphatase: 83 units/L (ref 39–117)
BUN: 9 mg/dL (ref 6–23)
Chloride: 97 meq/L (ref 96–112)
Cholesterol: 143 mg/dL (ref 0–200)
Creatinine, Ser: 0.64 mg/dL (ref 0.40–1.20)
HDL: 48 mg/dL (ref >39)
Indirect Bilirubin: 0.4 mg/dL (ref 0.0–0.9)
Total Protein: 8.3 g/dL (ref 6.0–8.3)
Triglycerides: 134 mg/dL (ref <150)

## 2010-05-22 ENCOUNTER — Ambulatory Visit (HOSPITAL_BASED_OUTPATIENT_CLINIC_OR_DEPARTMENT_OTHER)
Admission: RE | Admit: 2010-05-22 | Discharge: 2010-05-22 | Disposition: A | Discharge status: Home / Self Care | Payer: BC Managed Care – PPO | Source: Ambulatory Visit | Attending: Internal Medicine | Admitting: Internal Medicine

## 2010-05-22 ENCOUNTER — Telehealth: Payer: Self-pay | Admitting: Internal Medicine

## 2010-05-22 ENCOUNTER — Encounter (HOSPITAL_BASED_OUTPATIENT_CLINIC_OR_DEPARTMENT_OTHER): Payer: Self-pay

## 2010-05-22 ENCOUNTER — Other Ambulatory Visit: Payer: Self-pay | Admitting: Internal Medicine

## 2010-05-22 DIAGNOSIS — K859 Acute pancreatitis without necrosis or infection, unspecified: Secondary | ICD-10-CM

## 2010-05-22 DIAGNOSIS — E278 Other specified disorders of adrenal gland: Secondary | ICD-10-CM | POA: Insufficient documentation

## 2010-05-22 MED ORDER — IOHEXOL 300 MG/ML  SOLN
100.0000 mL | Freq: Once | INTRAMUSCULAR | Status: AC | PRN
  Administered 2010-05-22: 100 mL via INTRAVENOUS

## 2010-05-23 ENCOUNTER — Encounter: Payer: Self-pay | Admitting: Internal Medicine

## 2010-05-23 ENCOUNTER — Ambulatory Visit (INDEPENDENT_AMBULATORY_CARE_PROVIDER_SITE_OTHER): Payer: BC Managed Care – PPO | Admitting: Internal Medicine

## 2010-05-23 ENCOUNTER — Other Ambulatory Visit: Payer: Self-pay | Admitting: Internal Medicine

## 2010-05-23 DIAGNOSIS — E278 Other specified disorders of adrenal gland: Secondary | ICD-10-CM

## 2010-05-23 DIAGNOSIS — K859 Acute pancreatitis without necrosis or infection, unspecified: Secondary | ICD-10-CM

## 2010-05-23 LAB — CONVERTED CEMR LAB
Crystals: NONE SEEN
Ketones, ur: NEGATIVE mg/dL
Lipase: 273 units/L — ABNORMAL HIGH (ref 0–75)
Nitrite: NEGATIVE
Protein, ur: NEGATIVE mg/dL
Specific Gravity, Urine: 1.041 — ABNORMAL HIGH (ref 1.005–1.030)
Urobilinogen, UA: 0.2 (ref 0.0–1.0)

## 2010-05-24 ENCOUNTER — Ambulatory Visit (HOSPITAL_BASED_OUTPATIENT_CLINIC_OR_DEPARTMENT_OTHER)
Admission: RE | Admit: 2010-05-24 | Discharge: 2010-05-24 | Disposition: A | Discharge status: Home / Self Care | Payer: BC Managed Care – PPO | Source: Ambulatory Visit | Attending: Internal Medicine | Admitting: Internal Medicine

## 2010-05-24 ENCOUNTER — Encounter: Payer: Self-pay | Admitting: Internal Medicine

## 2010-05-24 DIAGNOSIS — Q619 Cystic kidney disease, unspecified: Secondary | ICD-10-CM | POA: Insufficient documentation

## 2010-05-24 DIAGNOSIS — E278 Other specified disorders of adrenal gland: Secondary | ICD-10-CM

## 2010-05-24 DIAGNOSIS — E279 Disorder of adrenal gland, unspecified: Secondary | ICD-10-CM

## 2010-05-26 ENCOUNTER — Telehealth: Payer: Self-pay | Admitting: Internal Medicine

## 2010-05-28 ENCOUNTER — Telehealth: Payer: Self-pay | Admitting: Internal Medicine

## 2010-05-29 NOTE — Progress Notes (Signed)
  Phone Note Outgoing Call   Summary of Call: LMOVM for pt to call back re blood test results  [name] DO  May 22, 2010 11:17 AM discussed blood tests results with pt pt's gas better.  no vomiting.  no severe abd pain  arrange CT of abd and pelvis with IV contrast. blood sugars are not improved.  pt will need to start insulin therapy.   pt advised to stop victoza pt will return tomorrow for OV Initial call taken by: [name] DO,  May 22, 2010 11:13 AM  New Problems: PANCREATITIS (ICD-577.0)   New Problems: PANCREATITIS (ICD-577.0)

## 2010-06-01 ENCOUNTER — Encounter: Payer: Self-pay | Admitting: Internal Medicine

## 2010-06-03 ENCOUNTER — Telehealth: Payer: Self-pay | Admitting: Internal Medicine

## 2010-06-04 ENCOUNTER — Encounter (INDEPENDENT_AMBULATORY_CARE_PROVIDER_SITE_OTHER): Payer: Self-pay | Admitting: *Deleted

## 2010-06-04 ENCOUNTER — Other Ambulatory Visit: Payer: Self-pay | Admitting: Internal Medicine

## 2010-06-04 ENCOUNTER — Telehealth: Payer: Self-pay | Admitting: Internal Medicine

## 2010-06-04 ENCOUNTER — Other Ambulatory Visit: Payer: BC Managed Care – PPO

## 2010-06-04 DIAGNOSIS — K859 Acute pancreatitis without necrosis or infection, unspecified: Secondary | ICD-10-CM

## 2010-06-04 LAB — LIPASE: Lipase: 116 U/L — ABNORMAL HIGH (ref 11.0–59.0)

## 2010-06-04 NOTE — Progress Notes (Signed)
Summary: Lab results  Phone Note Outgoing Call   Summary of Call: call pt - pancreatic enzyme blood test is improving.  u/a shows significant glucose in urine. this should improve with insulin therapy. some bacteria.  is pt having burning or pain with urination?   Initial call taken by: [name] DO,  May 26, 2010 8:50 AM  Follow-up for Phone Call        call placed to patient at [phone], no answer. A detailed voice message was left informing patient per Dr Artist Pais instructions. Message was left for patient to return call regarding her urinary symptoms. Follow-up by: Glendell Docker CMA,  May 26, 2010 10:34 AM  Additional Follow-up for Phone Call Additional follow up Details #1::        pt returned phone call. phone# [phone]. please assist. Additional Follow-up by: Elba Barman,  May 26, 2010 11:08 AM    Additional Follow-up for Phone Call Additional follow up Details #2::    c/o frequency in urination, elevated  blood sugars this 268 at 382, before breakfeast, 302 at bedtime, states thar it has not been under 300, and does not feel like it is dropping. 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 DO,  May 26, 2010 3:07 PM  New/Updated Medications: LANTUS SOLOSTAR 100 UNIT/ML SOLN (INSULIN GLARGINE) 30 units at bedtime as directed HUMALOG KWIKPEN 100 UNIT/ML SOLN (INSULIN LISPRO (HUMAN)) 5-15 units before meal two times a day as directed   call placed to patient [phone], she has  been informed per Dr Artist Pais instructions. Patient was reminded to return on Wednesday for a Lipase, which she states she will have drawn at the [facility] lab Glendell Docker CMA  May 26, 2010 5:10 PM

## 2010-06-04 NOTE — Progress Notes (Signed)
Summary: Blood Sugar Readings  Phone Note Call from Patient   Caller: Patient Call For: [name] DO Summary of Call: patient called and left voice message stating she was calling to report her blood sugars,and she has an additional question that she would like to ask.  Call was returned to patient at [phone]  x [phone], advised patient was in a meeting , and sent to voice mail. A detailed voice message was left for patient to return call with blood sugar readings and questions that she has. Initial call taken by: Glendell Docker CMA,  May 28, 2010 10:41 AM  Follow-up for Phone Call        call placed to patient at [phone], no answer. A detailed voice message was left for patient to return call regarding blood sugar readings Follow-up by: Glendell Docker CMA,  May 29, 2010 9:07 AM  Additional Follow-up for Phone Call Additional follow up Details #1::        patient returned phone call stating she was suppose to call back yesterday, but she is out of town and can be reached on her cell  phone [phone]. She states her blood sugar last night was 426 and this morning it was 327 before she had anything to eat. She states she is scheduled to see a nutritiionist on Monday.  Additional Follow-up by: Glendell Docker CMA,  May 29, 2010 12:05 PM    Additional Follow-up for Phone Call Additional follow up Details #2::    call placed to patient at [phone], she states she is using Lantus 30 units at bedtime and 10 units of Humalog two times a day before meals. She states she feel she is using her insulin correctly. She also state she is scheduled to folllow up with the diabetes educator on Monday. She was advised that Dr Artist Pais wanted to see her back in the office to demonstrate use of insulin, and checking of her blood sugar. She states she will follow up with him after she sees the diabetes educator. She was advised  to call back with a status update on Monday after seeing the diabetes  educator. Patient has verbalized understanding and agrees. Follow-up by: Glendell Docker CMA,  May 29, 2010 4:59 PM

## 2010-06-05 ENCOUNTER — Ambulatory Visit: Payer: BC Managed Care – PPO | Admitting: Internal Medicine

## 2010-06-09 ENCOUNTER — Ambulatory Visit (INDEPENDENT_AMBULATORY_CARE_PROVIDER_SITE_OTHER): Payer: BC Managed Care – PPO | Admitting: Internal Medicine

## 2010-06-09 ENCOUNTER — Encounter: Payer: Self-pay | Admitting: Internal Medicine

## 2010-06-09 DIAGNOSIS — IMO0001 Reserved for inherently not codable concepts without codable children: Secondary | ICD-10-CM

## 2010-06-09 DIAGNOSIS — I10 Essential (primary) hypertension: Secondary | ICD-10-CM

## 2010-06-10 NOTE — Progress Notes (Signed)
Summary: Status update  Phone Note Outgoing Call   Call placed by: Glendell Docker CMA,  June 03, 2010 10:23 AM Call placed to: Patient Summary of Call: call placed to patient at [phone], no answer. A detailed voice message was left for patient to return call regarding status update on nutritiion appointment and blood sugar readings. Initial call taken by: Glendell Docker CMA,  June 03, 2010 10:25 AM  Follow-up for Phone Call        call placed to patient at [phone], no answer. A detailed voice message was left for patient to return call regarding blood sugars. Follow-up by: Glendell Docker CMA,  June 03, 2010 4:48 PM  Additional Follow-up for Phone Call Additional follow up Details #1::        Patient states she saw the diabetes educator on Monday and was given a formula for insulin injections. She states this am  her blood sugar was 300, and she injected  2 units of Insulin. Before Breakefeast she rechecked her blood sugar and it was  240. She injected  8 units of insulin and had breakfeast. Her blood sugar was 154  around 3 pm she she injected 5 units of insulin. She states she was due to check her blood sugar, but had not yet done so. Additional Follow-up by: Glendell Docker CMA,  June 03, 2010 5:00 PM

## 2010-06-10 NOTE — Assessment & Plan Note (Signed)
Summary: gas/ss   Vital Signs:  Patient profile:   60 year old female Height:      60 inches Weight:      170 pounds BMI:     33.32 O2 Sat:      97 % on Room air Temp:     98.5 degrees F oral Pulse rate:   107 / minute Resp:     18 per minute BP sitting:   122 / 60  (right arm) Cuff size:   regular  Vitals Entered By: Glendell Docker CMA (May 20, 2010 4:11 PM)  O2 Flow:  Room air CC: Gas  Is Patient Diabetic? Yes Did you bring your meter with you today? No Pain Assessment Patient in pain? no        Primary Care Provider:  Dondra Spry DO  CC:  Gas .  History of Present Illness: 60 y/o female c/o increase in gas, has abdominal spasms,  increase in belching, and epigastric pain x 2 weeks.  chronic constipation   DM II - was able to lose some wt.  better dietary compliance   Allergies (verified): No Known Drug Allergies  Past History:  Past Medical History: CORONARY ARTERY DISEASE HYPERLIPIDEMIA  HYPERTENSION  HYPERCHOLESTEROLEMIA   SPINAL STENOSIS, LUMBAR B12 DEFICIENCY  ANEMIA  BACK PAIN, LUMBAR  KNEE PAIN, LEFT MENOPAUSE-RELATED VASOMOTOR SYMPTOMS, HOT FLASHES  BURSITIS, LEFT HIP TUBULOVILLOUS ADENOMA, COLON  DIVERTICULOSIS, COLON  ANXIETY/DEPRESSION PERIPHERAL NEUROPATHY  DIABETES MELLITUS, TYPE II, UNCONTROLLED  Colonic polyps, hx of History of right gluteus tear      Past Surgical History: CARDIAC CATHETERIZATION 2009,2006  s/p PTCA s/p stent 2002    s/p c-spine surgury 2003             Family History: mother with HTN and DM  father died at 51 yo with PNA, ETOH          Social History: Former Smoker Alcohol use-no    Single  Occupation:  Production designer, 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   Lungs:  normal respiratory effort and normal breath sounds.   Heart:  normal rate, regular rhythm, and no gallop.   Abdomen:  mild epigastric discomfort,  soft, no guarding, no  rigidity, and no rebound tenderness.   Extremities:  trace left pedal edema and trace right pedal edema.   Neurologic:  cranial nerves II-XII intact.     Impression & Recommendations:  Problem # 1:  ABDOMINAL PAIN, GENERALIZED (ICD-789.07) question IBS with constipation rule out pancreatitis  Orders: T-Hepatic Function ([id number]) T-Lipase (28413-24401)  Problem # 2:  HYPERTENSION (ICD-401.9) Assessment: Unchanged  Her updated medication list for this problem includes:    Diovan Hct 160-12.5 Mg Tabs (Valsartan-hydrochlorothiazide) ..... One by mouth once daily    Metoprolol Succinate 50 Mg Xr24h-tab (Metoprolol succinate) ..... One by mouth qd    Furosemide 20 Mg Tabs (Furosemide) ..... One by mouth once daily  Orders: T-Basic Metabolic Panel [id number])  BP today: 122/60 Prior BP: 132/70 (03/27/2010)  Labs Reviewed: K+: 4.0 (12/27/2009) Creat: : 0.57 (12/27/2009)   Chol: 110 (06/28/2008)   HDL: 39.90 (06/28/2008)   LDL: 44 (06/28/2008)   TG: 130.0 (06/28/2008)  Problem # 3:  DIABETES MELLITUS, TYPE II, UNCONTROLLED (ICD-250.02) some wt loss. see if any significant response to victoza  The following medications were removed from the medication list:    Metformin Hcl 1000 Mg Tabs (  Metformin hcl) .[name]... Take 1/2 tablet by mouth two times a day Her updated medication list for this problem includes:    Diovan Hct 160-12.5 Mg Tabs (Valsartan-hydrochlorothiazide) ..... One by mouth once daily      Orders: T- Hemoglobin A1C ([id number]) T-Urinalysis (81003-65000)  Complete Medication List: 1)  Crestor 10 Mg Tabs (Rosuvastatin calcium) .[name].. 1po qd 2)  Diovan Hct 160-12.5 Mg Tabs (Valsartan-hydrochlorothiazide) .... One by mouth once daily 3)  Plavix 75 Mg Tabs (Clopidogrel bisulfate) .[name].. 1 by mouth qd 4)  Accu-chek Aviva Strp (Glucose blood) .... For once daily testing 5)  Bd U/f Short Pen Needle 31g X 8 Mm Misc (Insulin pen needle) .... Use daily for victoza  injection 6)  Metoprolol Succinate 50 Mg Xr24h-tab (Metoprolol succinate) .... One by mouth qd 7)  Lexapro 10 Mg Tabs (Escitalopram oxalate) .... One by mouth qd 8)  Nascobal 500 Mcg/0.73ml Soln (Cyanocobalamin) .... 500 micrograms to one nostril q weekly 9)  Furosemide 20 Mg Tabs (Furosemide) .... One by mouth once daily 10)  Polyethylene Glycol 3350 Powd (Polyethylene glycol 3350) .[name].. 1 capful once to twice daily as directed 11)  Lantus Solostar 100 Unit/ml Soln (Insulin glargine) .... 30 units at bedtime as directed 12)  Humalog Kwikpen 100 Unit/ml Soln (Insulin lispro (human)) .... 5-15 units before meal two times a day as directed 13)  Onetouch Ultra Strips  .... Use 4 x daily as directed 14)  Onetouch Delica Lancets Misc (Lancets) .... Use qid as directed  Other Orders: T-Lipid Profile ([id number])  Patient Instructions: 1)  Ok to use align or probiotic (accu flora) once daily 2)  Start bulk laxative after BMs are normal 3)  Please schedule a follow-up appointment in 1 month. Prescriptions: POLYETHYLENE GLYCOL 3350  POWD (POLYETHYLENE GLYCOL 3350) 1 capful once to twice daily as directed  #1 month x 5   Entered and Authorized by:   [name] DO   Signed by:   [name] DO on 05/20/2010   Method used:   Electronically to        [facility]  [phone]* (retail)       [address] [place], [place]  [address]       Ph: [phone] or [phone]       Fax: [phone]   RxID:   [id number]    Orders Added: 1)  T-Basic Metabolic Panel [phone] 2)  T-Hepatic Function [[id number]] 3)  T- Hemoglobin A1C [83036-23375] 4)  T-Lipase [83690-23215] 5)  T-Lipid Profile [80061-22930] 6)  T-Urinalysis [81003-65000] 7)  Est. Patient Level III [01601]   Immunization History:  Influenza Immunization History:    Influenza:  declined (05/20/2010)   Contraindications/Deferment of Procedures/Staging:    Test/Procedure: FLU VAX    Reason for deferment:  patient declined   Immunization History:  Influenza Immunization History:    Influenza:  Declined (05/20/2010)  Current Allergies (reviewed today): No known allergies

## 2010-06-10 NOTE — Progress Notes (Signed)
Summary: Lab Results  Phone Note Outgoing Call   Summary of Call: call pt - pancreatic enzyme is improving  Initial call taken by: [name] DO,  June 04, 2010 9:39 PM  Follow-up for Phone Call        call placed to patient at [phone], no answer. A detailed voice message was left informing patient per  Dr Artist Pais instructions Follow-up by: Glendell Docker CMA,  June 05, 2010 8:53 AM

## 2010-06-10 NOTE — Assessment & Plan Note (Signed)
Summary: ov per dr.Gerhard Rappaport/ss   Vital Signs:  Patient profile:   60 year old female Height:      60 inches O2 Sat:      99 % on Room air Temp:     98.2 degrees F oral Pulse rate:   100 / minute Resp:     16 per minute BP sitting:   124 / 70  (left arm) Cuff size:   large  Vitals Entered By: Glendell Docker CMA (May 23, 2010 9:36 AM)  O2 Flow:  Room air CC: follow-up visit Comments discuss lab results   Primary Care Provider:  [name] DO  CC:  follow-up visit.  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  Preventive Screening-Counseling & Management  Alcohol-Tobacco     Alcohol drinks/day: 1     Alcohol type: spirits  Allergies: No Known Drug Allergies  Past History:  Past Medical History: CORONARY ARTERY DISEASE HYPERLIPIDEMIA  HYPERTENSION  HYPERCHOLESTEROLEMIA   SPINAL STENOSIS, LUMBAR  MAXILLARY SINUSITIS  CANDIDIASIS, VAGINAL  B12 DEFICIENCY ANEMIA  GASTROENTERITIS, ACUTE CERUMEN IMPACTION, LEFT  NAUSEA BACK PAIN, LUMBAR  FATIGUE GASTROENTERITIS  KNEE PAIN, LEFT MENOPAUSE-RELATED VASOMOTOR SYMPTOMS, HOT FLASHES  BURSITIS, LEFT HIP TUBULOVILLOUS ADENOMA, COLON  OTITIS MEDIA, ACUTE, LEFT  DIVERTICULOSIS, COLON  ANXIETY DEPRESSION PERIPHERAL NEUROPATHY  ASTHMA DIABETES MELLITUS, TYPE II, UNCONTROLLED  BRONCHITIS  Colonic polyps, hx of History of right gluteus tear      Past Surgical History: CARDIAC CATHETERIZATION 2009,2006  s/p PTCA s/p stent 2002    s/p c-spine surgury 2003             Social History: Former Smoker Alcohol use-yes Single  Occupation:  Production designer, 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  Physical Exam  General:  alert,  well-developed, and well-nourished.   Head:  normocephalic and atraumatic.   Mouth:  pharynx pink and moist.   Lungs:  normal respiratory effort and normal breath sounds.   Heart:  normal rate, regular rhythm, no murmur, and no gallop.   Abdomen:  mild epigastric tenderness, soft, no masses, no guarding, no rigidity, and no rebound tenderness.   Extremities:  trace left pedal edema and trace right pedal edema.   Neurologic:  cranial nerves II-XII intact.   Psych:  normally interactive, good eye contact, not anxious appearing, and not depressed appearing.     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 victoza stopped monitor Lipase levels  Orders: T-Lipase ([id number])  Problem # 2:  DIABETES MELLITUS, TYPE II, UNCONTROLLED (ICD-250.02) no change in A1c with victoza start insulin therapy  Her updated medication list for this problem includes:    Diovan Hct 160-12.5 Mg Tabs (Valsartan-hydrochlorothiazide) ..... One by mouth once daily    Lantus Solostar 100 Unit/ml Soln (Insulin glargine) .Marland KitchenMarland KitchenMarland KitchenMarland Kitchen 15 units to 30 units at bedtime as directed    Humalog Kwikpen 100 Unit/ml Soln (Insulin lispro (human)) .[name]... 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  Edema involving the head of the pancreas consistent with a history of pancreatitis. 2.  No complications identified.  Specifically I seen no evidence for pancreatic necrosis or pseudocyst formation.  There is no biliary obstruction. 3.  Indeterminate nodule within the left adrenal gland.  This could be better assessed with noncontrast MRI of the  upper abdomen. Orders: Radiology Referral (Radiology) Endocrinology Referral (Endocrine)  Complete Medication List: 1)  Crestor 10 Mg Tabs (Rosuvastatin calcium) .[name].. 1po qd 2)  Diovan Hct 160-12.5 Mg Tabs (Valsartan-hydrochlorothiazide) .... One by mouth once daily 3)  Plavix 75 Mg Tabs (Clopidogrel bisulfate) .[name].. 1 by mouth qd 4)  Accu-chek Aviva Strp (Glucose blood) .... For once daily testing 5)  Bd U/f Short Pen Needle 31g X 8 Mm Misc (Insulin pen needle) .... Use daily for victoza injection 6)  Metoprolol Succinate 50 Mg Xr24h-tab (Metoprolol succinate) .... One by mouth qd 7)  Lexapro 10 Mg Tabs (Escitalopram oxalate) .... One by mouth qd 8)  Nascobal 500 Mcg/0.19ml Soln (Cyanocobalamin) .... 500 micrograms to one nostril q weekly 9)  Furosemide 20 Mg Tabs (Furosemide) .... One by mouth once daily 10)  Polyethylene Glycol 3350 Powd (Polyethylene glycol 3350) .[name].. 1 capful once to twice daily as directed 11)  Lantus Solostar 100 Unit/ml Soln (Insulin glargine) .[name].. 15 units to 30 units at bedtime as directed 12)  Humalog Kwikpen 100 Unit/ml Soln (Insulin lispro (human)) .... 5-10 units before meal two times a day as directed 13)  Onetouch Ultra Strips  .... Use 4 x daily as directed 14)  Onetouch Delica Lancets Misc (Lancets) .... Use qid as directed  Patient Instructions: 1)  Our office will call you re:  referral to endocrinologist 2)  Please schedule a follow-up appointment in 2 weeks. Prescriptions: ONETOUCH DELICA LANCETS  MISC (LANCETS) use qid as directed  #120 x 11   Entered and Authorized by:   [name] DO   Signed by:   [name] DO on 05/23/2010   Method used:   Print then Give to Patient   RxID:   [id number] [facility] ULTRA STRIPS use 4 x daily as directed  #120 x 11   Entered and Authorized by:   [name] DO   Signed by:   [name] DO on 05/23/2010   Method used:   Print then Give to Patient   RxID:   [id number] HUMALOG KWIKPEN 100  UNIT/ML SOLN (INSULIN LISPRO (HUMAN)) 5-10 units before meal two times a day as directed  #1 month x 1   Entered and Authorized by:   [name] DO   Signed by:   [name] DO on 05/23/2010   Method used:   [facility] to        [facility]  [phone]* (retail)       [address] [place], [place]  [address]       Ph: [phone] or [phone]       Fax: [phone]   RxID:   [id number] LANTUS SOLOSTAR 100 UNIT/ML SOLN (INSULIN GLARGINE) 15 units to 30 units at bedtime as directed  #1 month x 1   Entered and Authorized by:   [name] DO   Signed by:   [name] DO on 05/23/2010   Method used:   Electronically  to        [facility]  [phone]* (retail)       [address] [place], [place]  [address]       Ph: [phone] or [phone]       Fax: [phone]   RxID:   [id number]    Orders Added: 1)  T-Lipase [id number] 2)  Radiology Referral [Radiology] 3)  Endocrinology Referral [Endocrine] 4)  Est. Patient Level IV [56433]

## 2010-07-01 NOTE — Assessment & Plan Note (Signed)
Summary: 2 WEEK FOLLOW UP/rsch from 2.23/ss   Vital Signs:  Patient profile:   60 year old female Height:      60 inches Weight:      179 pounds BMI:     35.08 O2 Sat:      99 % on Room air Temp:     97.9 degrees F oral Pulse rate:   88 / minute Resp:     18 per minute BP sitting:   144 / 80  (left arm) Cuff size:   large  Vitals Entered By: Glendell Docker CMA (June 09, 2010 11:00 AM)  O2 Flow:  Room air CC: 2 week follow up  Is Patient Diabetic? Yes Pain Assessment Patient in pain? no      Comments night time blood sugar 270-311 daytime averages 100. discuss Insulin usage   Primary Care Provider:  DThomos Lemons DO  CC:  2 week follow up .  History of Present Illness:  60 y/o female for DM II f/u using lantus 30 units of lantus blood sugar is 211 seen by Cristy Folks last week  blood sugars are still elevated at night  8 pm - 10 pm  ( pt has been snacking on pretzels and frozen grapes)  Preventive Screening-Counseling & Management  Alcohol-Tobacco     Smoking Status: quit  Allergies (verified): No Known Drug Allergies  Past History:  Past Medical History: CORONARY ARTERY DISEASE HYPERLIPIDEMIA  HYPERTENSION  HYPERCHOLESTEROLEMIA   SPINAL STENOSIS, LUMBAR   MAXILLARY SINUSITIS  CANDIDIASIS, VAGINAL  B12 DEFICIENCY ANEMIA  GASTROENTERITIS, ACUTE CERUMEN IMPACTION, LEFT  NAUSEA BACK PAIN, LUMBAR  FATIGUE GASTROENTERITIS  KNEE PAIN, LEFT MENOPAUSE-RELATED VASOMOTOR SYMPTOMS, HOT FLASHES  BURSITIS, LEFT HIP TUBULOVILLOUS ADENOMA, COLON  OTITIS MEDIA, ACUTE, LEFT  DIVERTICULOSIS, COLON  ANXIETY DEPRESSION PERIPHERAL NEUROPATHY  ASTHMA DIABETES MELLITUS, TYPE II, UNCONTROLLED  BRONCHITIS  Colonic polyps, hx of History of right gluteus tear      Past Surgical History: CARDIAC CATHETERIZATION 2009,2006  s/p PTCA s/p stent 2002    s/p c-spine surgury 2003              Family History: mother with HTN and DM  father died at 74  yo with PNA, ETOH           Social History: Former Smoker Alcohol use-yes  Single  Occupation:  Production designer, 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    Physical Exam  General:  alert, well-developed, and well-nourished.   Lungs:  normal respiratory effort and normal breath sounds.   Heart:  normal rate, regular rhythm, and no gallop.   Abdomen:  soft, non-tender, and normal bowel sounds.   Extremities:  1+ left pedal edema and 1+ right pedal edema.     Impression & Recommendations:  Problem # 1:  DIABETES MELLITUS, TYPE II, UNCONTROLLED (ICD-250.02) Assessment Improved continue insuline titration  Her updated medication list for this problem includes:    Diovan Hct 160-12.5 Mg Tabs (Valsartan-hydrochlorothiazide) ..... One by mouth once daily    Lantus Solostar 100 Unit/ml Soln (Insulin glargine) .Marland KitchenMarland KitchenMarland KitchenMarland Kitchen 30 units at bedtime as directed    Humalog Kwikpen 100 Unit/ml Soln (Insulin lispro (human)) .[name]... 5-15 units before meal two times a day as directed  Labs Reviewed: Creat: 0.64 (05/20/2010)     Last Eye Exam: BDR (01/31/2008) Reviewed HgBA1c results: 9.3 (05/20/2010)  9.6 (12/27/2009)  Problem # 2:  HYPERTENSION (ICD-401.9)  Her updated medication list for this problem includes:    Diovan Hct 160-12.5 Mg Tabs (Valsartan-hydrochlorothiazide) ..... One by mouth once daily    Metoprolol Succinate 50 Mg Xr24h-tab (Metoprolol succinate) ..... One by mouth qd    Furosemide 20 Mg Tabs (Furosemide) ..... One by mouth once daily  BP today: 144/80 Prior BP: 124/70 (05/23/2010)  Labs Reviewed: K+: 3.6 (05/20/2010) Creat: : 0.64 (05/20/2010)   Chol: 143 (05/20/2010)   HDL: 48 (05/20/2010)   LDL: 68 (05/20/2010)   TG: 134 (05/20/2010)  Problem # 3:  PANCREATITIS (ICD-577.0) Assessment: Improved  Complete Medication List: 1)  Crestor 10 Mg Tabs (Rosuvastatin calcium) .[name].. 1po qd 2)  Diovan Hct  160-12.5 Mg Tabs (Valsartan-hydrochlorothiazide) .... One by mouth once daily 3)  Plavix 75 Mg Tabs (Clopidogrel bisulfate) .[name].. 1 by mouth qd 4)  Accu-chek Aviva Strp (Glucose blood) .... For once daily testing 5)  Bd U/f Short Pen Needle 31g X 8 Mm Misc (Insulin pen needle) .... Use daily for victoza injection 6)  Metoprolol Succinate 50 Mg Xr24h-tab (Metoprolol succinate) .... One by mouth qd 7)  Lexapro 10 Mg Tabs (Escitalopram oxalate) .... One by mouth qd 8)  Nascobal 500 Mcg/0.69ml Soln (Cyanocobalamin) .... 500 micrograms to one nostril q weekly 9)  Furosemide 20 Mg Tabs (Furosemide) .... One by mouth once daily 10)  Polyethylene Glycol 3350 Powd (Polyethylene glycol 3350) .[name].. 1 capful once to twice daily as directed 11)  Lantus Solostar 100 Unit/ml Soln (Insulin glargine) .... 30 units at bedtime as directed 12)  Humalog Kwikpen 100 Unit/ml Soln (Insulin lispro (human)) .... 5-15 units before meal two times a day as directed 13)  Onetouch Ultra Strips  .... Use 4 x daily as directed 14)  Onetouch Delica Lancets Misc (Lancets) .... Use qid as directed  Patient Instructions: 1)  Please schedule a follow-up appointment in 2 months. 2)  BMP prior to visit, ICD-9:  401.9 3)  HbgA1C prior to visit, ICD-9: 250.02 4)  Urine Microalbumin prior to visit, ICD-9: 250.02 5)  Please return for lab work one (1) week before your next appointment.  6)  Schedule lipase next week - 577.0   Orders Added: 1)  Est. Patient Level III [11914]    Current Allergies (reviewed today): No known allergies

## 2010-07-01 NOTE — Letter (Signed)
Summary: [facility]  [facility]   Imported By: Maryln Gottron 06/23/2010 11:05:56  _____________________________________________________________________  External Attachment:    Type:   Image     Comment:   External Document

## 2010-07-09 ENCOUNTER — Telehealth: Payer: Self-pay | Admitting: Internal Medicine

## 2010-07-09 DIAGNOSIS — I10 Essential (primary) hypertension: Secondary | ICD-10-CM

## 2010-07-09 MED ORDER — METOPROLOL SUCCINATE ER 50 MG PO TB24: 50.0000 mg | ORAL_TABLET | Freq: Every day | ORAL | Status: DC

## 2010-07-09 NOTE — Telephone Encounter (Signed)
rx refill sent to pharmacy 

## 2010-07-09 NOTE — Telephone Encounter (Signed)
Refill- metoprolol succ er 50mg  tab. Take 1 tablet by mouth every day. Qty 30. Last fill 2.28.12

## 2010-08-17 ENCOUNTER — Other Ambulatory Visit: Payer: Self-pay | Admitting: Internal Medicine

## 2010-08-18 NOTE — Telephone Encounter (Signed)
Per 06/09/10 office note, pt was due for follow up in April. Has no appointments on file. Please advise if ok to give refill and how many?

## 2010-08-19 ENCOUNTER — Encounter: Payer: Self-pay | Admitting: Internal Medicine

## 2010-08-26 ENCOUNTER — Other Ambulatory Visit: Payer: Self-pay | Admitting: Family

## 2010-08-26 ENCOUNTER — Encounter: Payer: Self-pay | Admitting: Family

## 2010-08-26 ENCOUNTER — Ambulatory Visit (INDEPENDENT_AMBULATORY_CARE_PROVIDER_SITE_OTHER): Payer: BC Managed Care – PPO | Admitting: Family

## 2010-08-26 ENCOUNTER — Ambulatory Visit (HOSPITAL_BASED_OUTPATIENT_CLINIC_OR_DEPARTMENT_OTHER)
Admission: RE | Admit: 2010-08-26 | Discharge: 2010-08-26 | Disposition: A | Discharge status: Home / Self Care | Payer: BC Managed Care – PPO | Source: Ambulatory Visit | Attending: Family | Admitting: Family

## 2010-08-26 ENCOUNTER — Telehealth: Payer: Self-pay | Admitting: Family

## 2010-08-26 VITALS — BP 142/74 | HR 84 | Temp 98.3°F | Resp 18 | Ht 60.0 in | Wt 187.1 lb

## 2010-08-26 DIAGNOSIS — R609 Edema, unspecified: Secondary | ICD-10-CM

## 2010-08-26 DIAGNOSIS — M7989 Other specified soft tissue disorders: Secondary | ICD-10-CM | POA: Insufficient documentation

## 2010-08-26 DIAGNOSIS — R229 Localized swelling, mass and lump, unspecified: Secondary | ICD-10-CM

## 2010-08-26 MED ORDER — VALSARTAN-HYDROCHLOROTHIAZIDE 160-12.5 MG PO TABS: 1.0000 | ORAL_TABLET | Freq: Every day | ORAL | Status: DC

## 2010-08-26 MED ORDER — CEPHALEXIN 500 MG PO CAPS: 500.0000 mg | ORAL_CAPSULE | Freq: Four times a day (QID) | ORAL | Status: DC

## 2010-08-26 NOTE — Assessment & Plan Note (Signed)
[facility]                            CARDIOLOGY OFFICE NOTE   NAME:Shrader, [name]                     MRN:          [id number]  DATE:10/27/2007                            DOB:          28-Dec-1950    HISTORY:  [name] Bean returns today for followup.  She had her followup  stress test today.  She has history of stenting of the right coronary  artery.  She also has history of a diagonal branch lesion.   Her cath note indicates that the second diagonal branch was totally  occluded and filled by collaterals.   This was from cath in October 2006.   [name] has stopped smoking.  I congratulated her on this.  She is a  longstanding diabetic.   She has exertional dyspnea, which may be an anginal equivalent.  I  compared her Myoview study to one that she had in July 2008.  There does  appear to be more redistribution in the anterior apex.  From base to  apex, the inferior wall was normally perfused with an EF 68%.   I told [name] that I probably thought the best approach would be to do  a repeat heart cath.   She is agreeable.  The risks are known to her from the previous cath  including stroke.  Otherwise, except for the exertional dyspnea, she is  not having chest pain, but has never had chest pain even with a tight  right lesion.  She has been compliant with her meds.   She has no known allergies.   She is on:  1. Crestor 10 a day.  2. Plavix 75 a day.  3. Glucophage 1 g b.i.d., to be held prior to cath.  4. Toprol 100 a day.  5. Amaryl 2 b.i.d.  6. Diovan 160/12.5.  7. Cyclobenzaprine 10 a day.   Her exam is remarkable for an overweight black female with normal  affect.  Blood pressure is 140/80, pulse 79 and regular, weight 180, and  afebrile.  HEENT, unremarkable.  Carotids are normal.  No  lymphadenopathy, thyromegaly, or JVP elevation.  She has a scar from an  anterior cervical surgery.  Lungs are clear.  Good diaphragmatic motion.  No  wheezing.  S1 and S2.  Normal heart sounds.  PMI normal.  Abdomen is  benign.  Bowel sounds positive.  No AAA.  No tenderness.  No bruit.  Distal pulses are intact with no edema.  Neuro, nonfocal.  Skin is warm  and dry.  No muscular weakness.   IMPRESSION:  1. History of coronary artery disease, previous stent to the right      occluded diagonal branch.  Abnormal Myoview with change compared to      [facility] of July 2008.  Heart catheterization to be pursued next      Thursday.  Continue aspirin and Plavix.  2. Diabetes.  Hold Glucophage prior to heart catheterization.      Continue Amaryl.  Hemoglobin A1c quarterly.  3. Hyperlipidemia.  Continue Crestor.  Lipid and liver profile in 6      months.  4. Hypertension.  Currently well controlled.  Continue Diovan.   Further recommendations based on the results of her heart cath.     [name]. [name], MD, [facility]  Electronically Signed    PCN/MedQ  DD: 10/27/2007  DT: 10/28/2007  Job #: [id number]

## 2010-08-26 NOTE — Assessment & Plan Note (Signed)
[facility]                         GASTROENTEROLOGY OFFICE NOTE   NAME:Safley, HECTOR VENNE                     MRN:          [id number]  DATE:03/30/2007                            DOB:          1950/12/28    PROBLEM:  Constipation.   Mrs. Midgley has returned following colonoscopy.  Colonoscopy  demonstrated an adenomatous polyp and diverticulosis.  She has yet to  begin fiber.  She had loose stools over the past few days.   PHYSICAL EXAMINATION:  Pulse 74, blood pressure 110/60, weight 172.   IMPRESSION:  1. Functional constipation.  2. Adenomatous polyp.  3. Diverticulosis.   RECOMMENDATIONS:  1. Fiber supplementation.  I instructed Mrs. Vandrunen to return if this      alone does not relieve her constipation.  2. Followup colonoscopy in approximately 5 years.     Barbette Hair. Arlyce Dice, MD,FACG  Electronically Signed    RDK/MedQ  DD: 03/30/2007  DT: 03/30/2007  Job #: [id number]

## 2010-08-26 NOTE — Assessment & Plan Note (Signed)
[facility]                            CARDIOLOGY OFFICE NOTE   NAME:Leffel, [name]                     MRN:          [id number]  DATE:09/02/2007                            DOB:          Sep 10, 1950    [name] Bean returns today in followup.  She had a stent to the right  coronary artery in 2006.  She needs to have a followup Myoview in July.  She continues to have somewhat poor risk factor modification.  She has  stopped smoking.  Diabetes has not been very well controlled.  She has  not been following her diet well.   She is no longer taking Byetta.  Apparently there is a commercial on TV  about death with Byetta and she stopped it.   She continues to want to be on hormone therapy, but I have guarded  against this given her continued risk factors, diabetes and known  coronary artery disease.   She seems to be doing a bit better with her Prozac-like medicine.   REVIEW OF SYSTEMS:  Remarkable for no significant chest pain.  She does  get mild exertional dyspnea.  She continues to have diuresis and  sweating related to menopause.  Otherwise negative.  She is on Crestor  10 a day, Plavix 75 a day, Glucophage 1 gram b.i.d., Toprol 100 a day,  Diovan 80/12.5, Amaryl half of 1 mg tablet b.i.d., cyclobenzaprine 10 mg  a day.   EXAM:  Remarkable for blood pressure 130/80, pulse is 71 regular,  afebrile respiratory 14.  Affect jovial.  HEENT:  Unremarkable.  Carotids are normal without bruit, no lymphadenopathy, thyromegaly, JVP  elevation.  LUNGS:  Clear good diaphragmatic motion.  No wheezing.  S1-S2 normal heart sounds PMI normal.  ABDOMEN:  Benign.  Bowel sounds positive.  No tenderness, no  hepatosplenomegaly.  No hepatojugular reflux.  No bruit.  Distal pulse intact, no edema.  NEURO:  Nonfocal.  SKIN:  Warm and dry.  No muscular weakness.   EKG shows sinus rhythm with poor R wave progression.   IMPRESSION:  1. Coronary artery disease,  previous stent to the right coronary      artery, continued risk factors including diabetes.  Follow up      stress Myoview in July.  2. Hypercholesterolemia.  Her last LDL cholesterol, I believe, was 82.      We will check her LFTs and cholesterol when she comes back for her      Myoview.  Continue low cholesterol diet.  3. Hypertension currently well controlled.  Continue Diovan      hydrochlorothiazide.  4. Diabetes.  Should follow up with Dr. Artist Pais for more intensive      therapy.  I do not have a recent hemoglobin A1c on her.  Encouraged      her to follow up with this.  She needs to be on a low carbohydrate      diet.  5. Menopause.  Continue to avoid hormone replacement given coronary      disease.  Continue cyclobenzaprine for mood.  6. Overall, I think the patient  is doing well.  We will keep her on      long-term aspirin and Plavix for stent patency.     [name]. [name], MD, [facility]  Electronically Signed    PCN/MedQ  DD: 09/02/2007  DT: 09/02/2007  Job #: [id number]

## 2010-08-26 NOTE — Assessment & Plan Note (Signed)
[facility]                            CARDIOLOGY OFFICE NOTE   NAME:[name] Bean, [name] Bean                     MRN:          [id number]  DATE:11/09/2006                            DOB:          08/03/1950    [name] Bean returns today for followup.  She has known coronary artery  disease with previously stenting of the right coronary artery, moderate  left-sided disease.  She had an adenosine Myoview study done on November 02, 2006.  The adenosine was very uncomfortable for her.  She prefers to try  to walk next time.   The adenosine was low risk with questionable mildly reduced effusion in  the anterior apex.  Her EF was 70%.   She has not had significant chest pain.  She needs refills on her  Crestor today.   Her risk factor modification is going well.  She has not smoked in a  year and one-half, and her LDL cholesterol was in the 50s with normal  LFTs on Crestor.   She remains active without angina.   REVIEW OF SYSTEMS:  Otherwise remarkable for some urinary tract  symptoms.  She has had some frequency and right lower back pain.  She  needs to go to Dr. Olegario Bean office to get a UA C&S.  We could not do that  for her today here.   Her diet is poor.  She needs to work on this.  She has gained about 26  pounds in the last 5 years and is currently on multiple oral  hypoglycemics.  She understands that, if she can lose weight, her  insulin resistance will go down, and she can simplify this.   Otherwise work is going well.   PHYSICAL EXAMINATION:  GENERAL:  Remarkable for an affable black female  in no distress.  VITAL SIGNS:  Weight 172, which is stable from July 2007.  Blood  pressure 124/64, respiratory rate 14, pulse 72 and regular.  She is  afebrile.  HEENT:  Normal.  Carotids normal without bruits.  NECK:  There is no JVD, no lymphadenopathy, no thyromegaly, no bruits.  LUNGS:  Clear with good diaphragmatic motion.  No wheezing.  CARDIAC:  There  is an S1 and S2, normal heart sounds.  PMI is normal.  ABDOMEN:  Benign.  Bowel sounds positive.  No tenderness, no  hepatosplenomegaly, no hepatojugular reflux, no AAA.  No renal bruits.  EXTREMITIES:  Femorals +3 bilaterally without bruit.  Distal pulses  intact, no edema.  There is no lower extremity neuropathy.  NEUROLOGIC:  Exam is nonfocal.  There is no muscular weakness.   IMPRESSION:  1. Stable coronary artery disease, previous stenting of the right      coronary artery.  The patient refuses to take both aspirin and      Plavix.  We will continue Plavix 75 mg a day.  She is not having      chest pain.  Has has nitroglycerin at home if she needs it.  2. Hypercholesterolemia.  Will continue Crestor 10 mg a day and follow  up lipid profile in a year.  3. Previous smoking.  Continue abstinence.  She has done extremely      well with this.  4. Diabetes.  Needs improvement in her diet.  She still eats sweets      and lots of cake.  She understands the need to improve this.  She      will follow up with [name] Bean for hemoglobin A1c and continue her      Amaryl, Byetta,  and Glucophage.  5. Blood pressure currently under good control.  Continue low-salt      diet, Diovan 80/12.5.  The patient has had good control of her      blood pressure without any significant end-organ damage.   I will see her back in 6 months.     [name]. [name], MD, [facility]  Electronically Signed    PCN/MedQ  DD: 11/09/2006  DT: 11/10/2006  Job #: [id number]

## 2010-08-26 NOTE — Assessment & Plan Note (Signed)
[facility]                            CARDIOLOGY OFFICE NOTE   NAME:Bruck, ADLER                       MRN:          [id number]  DATE:01/26/2008                            DOB:          April 06, 1951    [name] Bean returns today for followup.  She is a usual jovial self.  Unfortunately, her sugars have not been well controlled.  She is back on  Byetta.  This has suppressed her appetite some.   She has known coronary artery disease.  She was cathed in July.  Her  coronaries were stable.  Stent in the right coronary artery is widely  patent and she had a subtotally chronically occluded diagonal resection,  otherwise well modified.  She has quit smoking, and she is on good  medicine for blood pressure and cholesterol.   REVIEW OF SYSTEMS:  Remarkable for no significant chest pain, PND, or  orthopnea.  She is little bit anxious about her job at [employer].  She is no  longer traveling as much.  She continues to read vociferously on her  kindle.   CURRENT MEDICATIONS:  1. Crestor 10 a day.  2. Plavix 75 a day.  3. Glucophage 1 g b.i.d.  4. Toprol 100 a day.  5. Amaryl 2 b.i.d.  6. Diovan HCT 160/12.5.  7. Cyclobenzaprine has been discontinued.  8. Zoloft.  9. Byetta 10 b.i.d.   PHYSICAL EXAMINATION:  GENERAL:  Remarkable for an overweight black  female in no distress.  VITAL SIGNS:  Weight is 178; blood pressure 123/70; pulse 82 and  regular; and respiratory rate 14, afebrile.  HEENT:  Unremarkable.  NECK:  Carotids are normal without bruit.  No lymphadenopathy,  thyromegaly, or JVP elevation.  LUNGS:  Clear, good diaphragmatic motion.  No wheezing.  S1 and S2.  Normal heart sounds.  PMI normal.  ABDOMEN:  Benign.  Bowel sounds positive.  No AAA.  No tenderness.  No  bruit.  No hepatosplenomegaly or hepatojugular reflux.  EXTREMITIES:  Distal pulses are intact.  No edema.  NEURO:  Nonfocal.  SKIN:  Warm and dry.  MUSCULOSKELETAL:  No muscle  weakness.   IMPRESSION:  1. Coronary artery disease, previous stent to the right.  Recent      catheterization with patency.  Continue risk factor modification,      aspirin and low-dose beta-blocker.  2. Hypercholesterolemia.  Continue statin.  Lipid and liver profile in      6 months.  3. Diabetes, poorly controlled.  Hemoglobin A1c was at 8 back on      Byetta.  Followup primary care MD.  4. Previous smoker.  Continue to abstain.  Congratulated her on this.  5. Hypertension, currently well controlled.  Continue current dose of      angiotensin II receptor blocker.   I will see her back in a year.     [name]. [name], MD, [facility]  Electronically Signed    PCN/MedQ  DD: 01/26/2008  DT: 01/26/2008  Job #: [id number]

## 2010-08-26 NOTE — Progress Notes (Signed)
Subjective:    Patient ID: [name] Bean, female    DOB: 1950-08-06, 60 y.o.   MRN: [id number]  HPI [name] Bean is a 60 yr old female who presents today with chief complaint of Left foot/ankle swelling x 1 week.   Denies fever or pain.  She did have some associated calf pain in the left calf today.  Denies recent travel.     Review of Systems See HPI Past Medical History  Diagnosis Date  . Diabetes mellitus   . CAD (coronary artery disease)   . Hyperlipidemia   . Unspecified essential hypertension   . Spinal stenosis of lumbar region   . Maxillary sinusitis   . Candidiasis, vagina   . Other B-complex deficiencies   . Anemia   . Gastroenteritis   . Cerumen impaction   . Nausea   . Lumbar back pain   . Fatigue   . Knee pain, left   . Hot flashes, menopausal   . Bursitis of left hip   . Tubulovillous adenoma of colon   . Otitis media, acute     left  . Colon, diverticulosis   . Anxiety   . Depression   . Peripheral neuropathy   . Asthma   . Bronchitis   . Muscle tear     right gluteus    History   Social History  . Marital Status: Single    Spouse Name: N/A    Number of Children: N/A  . Years of Education: N/A   Occupational History  . SR DEV OFFICER     [employer]   Social History Main Topics  . Smoking status: Former Games developer  . Smokeless tobacco: Not on file  . Alcohol Use: Yes  . Drug Use: Not on file  . Sexually Active: Not on file   Other Topics Concern  . Not on file   Social History Narrative  . No narrative on file    Past Surgical History  Procedure Date  . Cardiac catheterization 2009, 2006  . Cervical spine surgery 2003  . Coronary angioplasty with stent placement 2002    Family History  Problem Relation Age of Onset  . Hypertension Mother   . Diabetes Mother   . Pneumonia Father     deceased  . Alcohol abuse Father     No Known Allergies  Current Outpatient Prescriptions on File Prior to Visit  Medication Sig Dispense Refill    . clopidogrel (PLAVIX) 75 MG tablet Take 75 mg by mouth daily.        . Cyanocobalamin (NASCOBAL) 500 MCG/0.1ML SOLN One spray to one nostril once weekly       . escitalopram (LEXAPRO) 10 MG tablet Take 10 mg by mouth daily.        . furosemide (LASIX) 20 MG tablet Take 20 mg by mouth daily.        . insulin glargine (LANTUS SOLOSTAR) 100 UNIT/ML injection Inject 36 Units into the skin at bedtime.       . insulin lispro (HUMALOG KWIKPEN) 100 UNIT/ML injection Inject 5-15 Units into the skin 4 (four) times daily.       . Insulin Pen Needle (B-D ULTRAFINE III SHORT PEN) 31G X 8 MM MISC Use daily for Victoza injection       . metoprolol (TOPROL-XL) 50 MG 24 hr tablet Take 1 tablet (50 mg total) by mouth daily.  30 tablet  5  . Polyethylene Glycol 3350 (MIRALAX PO) Take 17 g by  mouth 2 (two) times daily. As directed       . rosuvastatin (CRESTOR) 10 MG tablet Take 10 mg by mouth daily.        [name] DISCONTD: valsartan-hydrochlorothiazide (DIOVAN-HCT) 160-12.5 MG per tablet Take 1 tablet by mouth daily.        Letta Pate DELICA LANCETS MISC Use four times daily as directed         BP 142/74  Pulse 84  Temp(Src) 98.3 F (36.8 C) (Oral)  Resp 18  Ht 5' (1.524 m)  Wt 187 lb 1.9 oz (84.877 kg)  BMI 36.54 kg/m2       Objective:   Physical Exam  Constitutional: She appears well-developed and well-nourished.  Cardiovascular: Normal rate and regular rhythm.   Pulmonary/Chest: Effort normal and breath sounds normal.  Musculoskeletal:       LLE swelling 2-3+, RLE swelling 2+  skin: mild erythema noted of left lateral ankle. No warmth or tenderness.       Assessment & Plan:  nascobal 535mcg/spray sample given to patient.

## 2010-08-26 NOTE — Assessment & Plan Note (Signed)
[facility]                            CARDIOLOGY OFFICE NOTE   NAME:Kalb, DAISI                       MRN:          [id number]  DATE:01/26/2008                            DOB:          18-Aug-1950    Johni returns today for followup.  She is a usual jovial self.  Unfortunately, her sugars have not been well controlled.  She is back on  Byetta.  This has suppressed her appetite some.   She has known coronary artery disease.  She was cathed in July.  Her  coronaries were stable.  Stent in the right coronary artery is widely  patent and she had a subtotally chronically occluded diagonal resection,  otherwise well modified.  She has quit smoking, and she is on good  medicine for blood pressure and cholesterol.   REVIEW OF SYSTEMS:  Remarkable for no significant chest pain, PND, or  orthopnea.  She is little bit anxious about her job at [employer].  She is no  longer traveling as much.  She continues to read vociferously on her  kindle.   CURRENT MEDICATIONS:  1. Crestor 10 a day.  2. Plavix 75 a day.  3. Glucophage 1 g b.i.d.  4. Toprol 100 a day.  5. Amaryl 2 b.i.d.  6. Diovan HCT 160/12.5.  7. Cyclobenzaprine has been discontinued.  8. Zoloft.  9. Byetta 10 b.i.d.   PHYSICAL EXAMINATION:  GENERAL:  Remarkable for an overweight black  female in no distress.  VITAL SIGNS:  Weight is 178; blood pressure 123/70; pulse 82 and  regular; and respiratory rate 14, afebrile.  HEENT:  Unremarkable.  NECK:  Carotids are normal without bruit.  No lymphadenopathy,  thyromegaly, or JVP elevation.  LUNGS:  Clear, good diaphragmatic motion.  No wheezing.  S1 and S2.  Normal heart sounds.  PMI normal.  ABDOMEN:  Benign.  Bowel sounds positive.  No AAA.  No tenderness.  No  bruit.  No hepatosplenomegaly or hepatojugular reflux.  EXTREMITIES:  Distal pulses are intact.  No edema.  NEURO:  Nonfocal.  SKIN:  Warm and dry.  MUSCULOSKELETAL:  No muscle  weakness.   IMPRESSION:  1. Coronary artery disease, previous stent to the right.  Recent      catheterization with patency.  Continue risk factor modification,      aspirin and low-dose beta-blocker.  2. Hypercholesterolemia.  Continue statin.  Lipid and liver profile in      6 months.  3. Diabetes, poorly controlled.  Hemoglobin A1c was at 8 back on      Byetta.  Followup primary care MD.  4. Previous smoker.  Continue to abstain.  Congratulated her on this.  5. Hypertension, currently well controlled.  Continue current dose of      angiotensin II receptor blocker.   I will see her back in a year.     [name]. [name], MD, [facility]     PCN/MedQ  DD: 01/26/2008  DT: 01/26/2008  Job #: [id number]

## 2010-08-26 NOTE — Cardiovascular Report (Signed)
NAMETARNISHA, Bean              ACCOUNT NO.:  [id number]   MEDICAL RECORD NO.:  [id number]          PATIENT TYPE:  OIB   LOCATION:  1966                         FACILITY:  [facility]   PHYSICIAN:  Peter C. [name], MD, FACCDATE OF BIRTH:  [birth year]   DATE OF PROCEDURE:  DATE OF DISCHARGE:  11/03/2007                            CARDIAC CATHETERIZATION   CORONARY ARTERIOGRAPHY   INDICATIONS:  Previous anterior wall MI, previous stent to the RCA, and  abnormal Myoview suggesting significant anterolateral wall ischemia.   Catheterization done with 4-French catheters in the right femoral  artery.   The left main coronary artery was normal.   Left anterior descending artery had 20-30% multiple lesions in proximal,  mid, and distal portion.  The patient had three small diagonal branches.  The second diagonal branch was subtotally occluded.  This appeared  similar to the patient's last cath by Dr. Juanda Chance.  Circumflex coronary  artery was nondominant, primarily consisted of one large obtuse marginal  branch and an AV-groove branch.  There was no significant disease.   The right coronary artery had 40%-50% diffuse disease in the proximal  midportion.  There was a widely patent stent to the distal RCA.  PDA and  PLA were normal.  There were no significant right-to-left collaterals to  the diagonal branch.   RAO ventriculography:  RAO ventriculography showed anterior apical wall  hypokinesis.  The EF was 45%.  There was no mural apical thrombus.   Aortic pressure was 160/68 and LV pressure was 160/21.   IMPRESSION:  The patient's coronary anatomy appeared stable.  The stent  in the RCA is patent.  The small diagonal branch is known to be  subtotally occluded.   She will continue to have medical therapy.  She tolerated the procedure  well.      [name]. [name], MD, [facility]  Electronically Signed     PCN/MEDQ  D:  11/03/2007  T:  11/04/2007  Job:  [id number]

## 2010-08-26 NOTE — Assessment & Plan Note (Signed)
[facility]                            CARDIOLOGY OFFICE NOTE   NAME:Bean, [name] PHARO                     MRN:          [id number]  DATE:02/25/2007                            DOB:          July 05, 1950    [name] Bean is seen today at the request of her OB/GYN doctor.  She wanted  to discuss hormone replacement, and I have seen [name] Bean for coronary  artery disease.   She has a stent to the right coronary artery with moderate left sided  disease.  She is on Plavix.  She is not having any significant chest  pain.  Her risk factors are fairly well modified.   She has:  1. Hypercholesterolemia.  2. Hypertension.  3. Diabetes.   The patient has been depressed lately.  She has had a decreased sex  drive.  She appears to be having some arthritis, particularly in her  legs and knees.  Apparently she had some hormonal testing and may very  well be in menopause with a question of decreased testosterone as well.  Her OB/GYN doctor wanted to put her on estrogen and progesterone.  We  had a long discussion today regarding the Women's Heart Study.  There is  a slight increased risk of stroke and heart attack in patients on  replacement therapy.  In talking to [name] Bean, it seemed like most of her  symptoms had to do more with depression than hot flashes or things that  hormonal replacement would help with.  In light of this and the risk of  her coronary disease, I decided to put her on Prozac 20 mg a day and  avoid hormonal replacement at this time.  The patient was in agreement  with this plan.   REVIEW OF SYSTEMS:  Otherwise negative.  In particular, she is not  having any significant chest pain, palpations, PND, or orthopnea.   CURRENT MEDICATIONS:  1. Diovan/HCTZ 160/12.5.  2. Plavix 75 a day.  3. Toprol 25 a day.  4. Tenormin 1 gram b.i.d.  5. Crestor 10 a day.  6. __________ 10 mg b.i.d.  She uses the [facility] on [address].   PHYSICAL EXAMINATION:   GENERAL:  Remarkable for a healthy-appearing  middle-aged black female in no distress.  VITAL SIGNS:  Weight is 174, blood pressure is 120/68, pulse 80 and  regular.  HEENT:  Unremarkable.  NECK:  No lymphadenopathy, thyromegaly, JVP elevation.  LUNGS:  Clear with good diaphragmatic motion.  No wheezing.  HEART:  There is an S1 S2.  No murmurs.  ABDOMEN:  Benign.  Bowel sounds positive.  No hepatosplenomegaly,  hepatojugular reflux.  EXTREMITIES:  Distal pulses are intact.  No edema.  NEUROLOGIC:  Nonfocal.  SKIN:  Warm and dry.  No muscular weakness.   IMPRESSION:  1. Coronary disease, previous stent to the right coronary artery.      Continue Plavix.  No angina.  Followup stress testing in a year.  2. Hypertension, currently well controlled.  Continue Diovan HCTZ, low      salt diet.  3. Hypercholesterolemia.  Continue Crestor 10 mg a  day.  No evidence      of myalgias.  Lipid and liver profile in 6 months.  4. Depression.  Start Prozac 20 mg a day.  5. Question menopause.  For the time being, we will treat her with      Prozac.  Her actual symptoms of low hormonal levels are minimal and      there is a higher risk of heart attack given her known coronary      disease.  She will follow up with her OB/GYN doctor to see if there      is any other substitutes in regards to holistic medicine that may      help with her low hormone levels.   I will see her back in 3 months.     [name]. [name], MD, [facility]  Electronically Signed    PCN/MedQ  DD: 02/25/2007  DT: 02/25/2007  Job #: [id number]

## 2010-08-26 NOTE — Assessment & Plan Note (Addendum)
LE doppler performed to evaluate for DVT and is negative.  Will plan to treat empirically for cellulitis with keflex.

## 2010-08-26 NOTE — Patient Instructions (Signed)
Please complete your ultrasound today.   Call if your symptoms worsen or if they do not improve.  Follow up in 2 weeks with Dr. Artist Pais.

## 2010-08-26 NOTE — Telephone Encounter (Signed)
Please call patient in AM and let her know that her ultrasound is negative for DVT.  (Her home phone would not accept messages and cell no answer)

## 2010-08-26 NOTE — Assessment & Plan Note (Signed)
[facility]                         GASTROENTEROLOGY OFFICE NOTE   NAME:Bean, [name] STROM                     MRN:          [id number]  DATE:01/13/2007                            DOB:          Oct 09, 1950    REASON FOR CONSULTATION:  Constipation.   [name] Bean is a 60 year old African-American female referred through the  courtesy of Dr. Artist Pais for evaluation.  Over the past 2 years, she has been  experiencing increasing constipation.  She may go up to a week without a  spontaneous bowel movement.  She occasionally has overflow diarrhea.  There is no history of melena or hematochezia.  She complains of excess  gas with abdominal distension with her constipation.   PAST MEDICAL HISTORY:  Pertinent for hypertension and asthma.  She has  diabetes and hypercholesterolemia.  She is status post angioplasty with  stent, and surgery on her cervical spine.   FAMILY HISTORY:  Noncontributory.   MEDICATIONS:  Crestor.  Plavix.  Glucophage.  Toprol XL.  Diovan.  Amaryl.  Byetta.  Glimepiride.   She has no allergies.   She does not smoke.  She drinks rarely.  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   REVIEW OF SYSTEMS:  Reviewed and negative.   EXAM:  Pulse 64, blood pressure 110/62, weight 174.  HEENT:  EOMI. PERRLA. Sclerae are anicteric.  Conjunctivae are pink.  NECK:  Supple without thyromegaly, adenopathy or carotid bruits.  CHEST:  Clear to auscultation and percussion without adventitious  sounds.  CARDIAC:  Regular rhythm; normal S1 S2.  There are no murmurs, gallops  or rubs.  ABDOMEN:  Bowel sounds are normoactive.  Abdomen is soft, non-tender and  non-distended.  There are no abdominal masses, tenderness, splenic  enlargement or hepatomegaly.  EXTREMITIES:  Full range of motion.  No cyanosis, clubbing or edema.  RECTAL:  Deferred.   IMPRESSION:  1. Constipation.  This may be functional, perhaps related to her  multiple medications.  2. Diabetes.  3. Coronary artery disease.  4. Asthma.  5. Hypertension.   RECOMMENDATION:  1. Fiber supplementation daily.  2. Colonoscopy.     Barbette Hair. Arlyce Dice, MD,FACG  Electronically Signed    RDK/MedQ  DD: 01/13/2007  DT: 01/13/2007  Job #: [id number]   cc:   Barbette Hair. Artist Pais, DO

## 2010-08-27 NOTE — Telephone Encounter (Signed)
Left message on machine to return my call. 

## 2010-08-29 ENCOUNTER — Encounter: Payer: Self-pay | Admitting: Internal Medicine

## 2010-08-29 ENCOUNTER — Ambulatory Visit (INDEPENDENT_AMBULATORY_CARE_PROVIDER_SITE_OTHER): Payer: BC Managed Care – PPO | Admitting: Internal Medicine

## 2010-08-29 DIAGNOSIS — M7989 Other specified soft tissue disorders: Secondary | ICD-10-CM

## 2010-08-29 DIAGNOSIS — I2510 Atherosclerotic heart disease of native coronary artery without angina pectoris: Secondary | ICD-10-CM

## 2010-08-29 DIAGNOSIS — I10 Essential (primary) hypertension: Secondary | ICD-10-CM

## 2010-08-29 MED ORDER — FUROSEMIDE 40 MG PO TABS: 40.0000 mg | ORAL_TABLET | Freq: Every day | ORAL | Status: DC

## 2010-08-29 NOTE — Discharge Summary (Signed)
NAME:  [name] Bean, [name] Bean              ACCOUNT NO.:  [id number]   MEDICAL RECORD NO.:  [id number]          PATIENT TYPE:  OIB   LOCATION:  6527                         FACILITY:  [facility]   PHYSICIAN:  Gene Serpe, P.A. [facility]   DATE OF BIRTH:  23-Dec-1950   DATE OF ADMISSION:  06/02/2004  DATE OF DISCHARGE:                           DISCHARGE SUMMARY - REFERRING   ADDENDUM:   DISCHARGE DIAGNOSES:  1.  Hypokalemia.      GS/MEDQ  D:  06/03/2004  T:  06/03/2004  Job:  [id number]

## 2010-08-29 NOTE — Telephone Encounter (Signed)
Pt.notified

## 2010-08-29 NOTE — Cardiovascular Report (Signed)
NAME:  [name] Bean, [name] Bean              ACCOUNT NO.:  [id number]   MEDICAL RECORD NO.:  [id number]          PATIENT TYPE:  OIB   LOCATION:  6527                         FACILITY:  [facility]   PHYSICIAN:  Charlies Constable, M.D. [facility] DATE OF BIRTH:  [birth year]   DATE OF PROCEDURE:  06/02/2004  DATE OF DISCHARGE:                              CARDIAC CATHETERIZATION   HISTORY OF PRESENT ILLNESS:  Ms. Woodroof is 60 years old and has multiple  risk factors for coronary disease including diabetes, hypertension, smoking,  and recently developed some shortness of breath and had a Cardiolite scan  which showed anterior ischemia. We did an outpatient catheterization and  found tight lesions in the right coronary artery and decided to proceed with  intervention today. 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   DESCRIPTION OF PROCEDURE:  The procedure was performed by the right femoral  artery using arterial sheath and a 6-French JR-4 guiding catheter with side  holes. A frontal wall arterial puncture was performed and Omnipaque contrast  was used. The patient was given Angiomax bolus and infusion and was given  300 mg of Plavix at the end of the procedure. She was enrolled in the Costar  study and randomized to a Costar study stent.   We passed an Asahi prowater wire down the right coronary artery, across the  tandem lesions without difficulty. We predilated with a 2.25 by 50 mm  Maverick performing two inflations up to 10 atmospheres for 30 seconds. We  then deployed a 2.5 by 20 mm Costar study stent deploying this with one  inflation of 12 atmospheres for 30 seconds. We then postdilated with a 2.75  by 15 mm Quantum Maverick performing three inflations up to 15 atmospheres  for 30 seconds. Repeat diagnostic studies was performed through the guiding  catheter.   The patient tolerated the procedure well and left the laboratory in  satisfactory condition.   RESULTS:  Initially, there were  tandem stenoses in the midright coronary  artery estimated at 80% and 80%. The following stenting has improved to 0%.   CONCLUSION:  Successful stenting of tandem lesions in the midright coronary  artery with one Costar drug-eluting study stent with improvement in arterial  narrowing from 80% and 80% to 0%.   DISPOSITION:  The patient returned to the postanesthesia unit for further  observation. She should remain on Plavix for one year.      BB/MEDQ  D:  06/02/2004  T:  06/02/2004  Job:  [id number]   cc:   Charlton Haws, M.D.   Thomos Lemons, D.O. [facility]   Cardiopulmonary Lab

## 2010-08-29 NOTE — Assessment & Plan Note (Signed)
U/A negative for proteinuria. I doubt CHF.  Check BNP Pt advised to further decrease sodium intake Increase lasix to 60 mg x 1 week  Start daily walking program Reassess in 2 weeks

## 2010-08-29 NOTE — Op Note (Signed)
NAME:  TALAYLA, DOYEL                        ACCOUNT NO.:  [id number]   MEDICAL RECORD NO.:  [id number]                   PATIENT TYPE:  INP   LOCATION:  3007                                 FACILITY:  [facility]   PHYSICIAN:  Coletta Memos, M.D.                  DATE OF BIRTH:  09-09-1950   DATE OF PROCEDURE:  03/20/2002  DATE OF DISCHARGE:  03/22/2002                                 OPERATIVE REPORT   PREOPERATIVE DIAGNOSES:  1. Cervical spondylosis with myelopathy.  2. Cervical radiculopathy.   POSTOPERATIVE DIAGNOSES:  1. Cervical spondylosis with myelopathy.  2. Cervical radiculopathy.   PROCEDURES:  1. Cervical corpectomy C4, C5.  2. Arthrodesis C3 to C6.  3. Anterior instrumentation C3 to C6 with Premier Synthes plate and a     __________ cage interbody graft.   COMPLICATIONS:  None.   SURGEON:  Coletta Memos, M.D.   ASSISTANT:  Payton Doughty, M.D.   INDICATIONS:  The patient is a 60 year old with severe cervical myelopathy  due to cervical stenosis and spondylitic change.  I had contemplated doing a  multilevel diskectomy at C3-4, C4-5, C5-6.  However, upon reviewing her  films once again and intraoperative pathology, I felt it best that she  undergo corpectomy.   DESCRIPTION OF PROCEDURE:  The patient was brought to the operating room,  intubated, and placed under general anesthesia without difficulty.  She was  placed with her head in slight extension with 10 pounds of traction applied  via chin strap.  Her neck was prepped, and she was draped in a sterile  fashion.  I made an incision on the left side of the neck just medial to the  medial border of the sternocleidomastoid.  After opening that I developed a  plane through the platysma sharply.  I then identified the  sternocleidomastoid and the medial strap muscles.  Then I created an  avascular passage to the anterior cervical spine.  After x-ray confirmed  that I was in the correct location, I then felt it best  that the patient  undergo a corpectomy, so I performed corpectomies of C4 and C5 using both  the Leksell rongeur, Kerrison punch, and a high-speed air drill.  I was able  to achieve excellent decompression of the C4, C5, and C6 nerve roots.  I  placed a __________ cage which was packed with autograft and mixed with bone  putty.  This was placed into the space between C3 and C6.  I then used  anterior instrumentation and placed two screws in C3 and two screws at C6  through a Premier plate.  Intraoperative x-ray showed the cage and the plate  to  both be in good position.  I then irrigated the wound.  I then  reapproximated the platysma, then subcutaneous tissues.  Dermabond was used  for a sterile dressing.  The patient was extubated and  moving all  extremities and had an Aspen collar placed without difficulty.                                                Coletta Memos, M.D.    KC/MEDQ  D:  04/28/2002  T:  04/29/2002  Job:  [id number]

## 2010-08-29 NOTE — Discharge Summary (Signed)
[name] Bean, [name] Bean              ACCOUNT NO.:  [id number]   MEDICAL RECORD NO.:  [id number]          PATIENT TYPE:  OIB   LOCATION:  6527                         FACILITY:  [facility]   PHYSICIAN:  Charlies Constable, M.D. [facility] DATE OF BIRTH:  1950-10-31   DATE OF ADMISSION:  06/02/2004  DATE OF DISCHARGE:  06/03/2004                           DISCHARGE SUMMARY - REFERRING   PROCEDURE:  Coronary angiogram/stenting (Costar), right coronary artery on  June 02, 2004.   REASON FOR ADMISSION:  [name] is a 60 year old female who underwent  recent diagnostic coronary angiogram on February 17, by Dr. Charlies Constable  following an abnormal Cardiolite revealing anterior ischemia.  She was found  to have significant right coronary artery disease and returned for elective  percutaneous intervention.   LABORATORY DATA AND X-RAY FINDINGS:  Normal CBC at discharge.  Potassium 3.0  at discharge (3.7 on admission).  Normal liver enzymes.  Hemoglobin A1C 7.5.   Admission chest x-ray with chronic interstitial lung disease.   HOSPITAL COURSE:  The patient underwent successful elective percutaneous  intervention by Dr. Charlies Constable with placement of a Costar stent for  treatment of two 80% right coronary artery lesions dilated to 0% residual  stenosis with no noted complications.  The patient was kept for overnight  observation.  She was cleared for discharge the following morning by Dr.  Charlton Haws.  There are no noted complications of the right coronary  incision site.   At discharge, Dr. [name] recommended additional of Wellbutrin and nicotine  patch to be started in 2 weeks to assist the patient in smoking cessation.  She was also referred for a consultation.   Of note, the patient also received supplemental potassium for correction of  hypokalemia.  She will need a followup BMET in 1 week.   DISCHARGE MEDICATIONS:  1.  Plavix 75 mg q.d.  2.  Coated aspirin 325 mg.  3.  Wellbutrin 150 mg q.d.  x3 days, then one tablet b.i.d.  4.  Metformin 1 g b.i.d. (start in 48 hours).  5.  Nicoderm 21 mg patch (start in 2 weeks).  6.  Amaryl as previously directed.  7.  Neurontin 300 mg q.a.m./600 mg q.p.m.  8.  Vytorin 10/20 q.d.  9.  Diovan/HCTZ 160/12.5 mg q.d.  10. Nitrostat 0.4 mg p.r.n.   ACTIVITY:  No heavy lifting/driving x2 days.   DIET:  Maintain low fat, low cholesterol diet.   SPECIAL INSTRUCTIONS:  Call the office if there is any swelling/bleeding of  the groin.  Stop smoking tobacco.   FOLLOW UP:  Follow up metabolic profile in 1 week (hypokalemia).  Follow up  with Dr. Charlton Haws on Wednesday, March 22, at 11:30 a.m.   DISCHARGE DIAGNOSES:  1.  Coronary artery disease, status post stenting (Costar) right coronary      artery x2 on June 02, 2004.  2.  Tobacco.  3.  Type 2 diabetes mellitus.  4.  Dyslipidemia.  5.  History of asthma.  6.  Hypertension.      GS/MEDQ  D:  06/03/2004  T:  06/03/2004  Job:  [id number]   cc:   Thomos Lemons, D.O. [facility]

## 2010-08-29 NOTE — Cardiovascular Report (Signed)
[name] Bean, [name] Bean              ACCOUNT NO.:  [id number]   MEDICAL RECORD NO.:  [id number]          PATIENT TYPE:  OIB   LOCATION:  1965                         FACILITY:  [facility]   PHYSICIAN:  Charlies Constable, M.D. [facility] DATE OF BIRTH:  June 07, 1950   DATE OF PROCEDURE:  01/14/2005  DATE OF DISCHARGE:                              CARDIAC CATHETERIZATION   CLINICAL HISTORY:  Ms. Lackey is 61 years old and works in Animal nutritionist for [employer]. She had a stent placed to the right coronary artery in  February of this year. She has diabetes, hypertension, and unfortunately has  continued to smoke. She had a Cardiolite scan done recently which showed  anterior ischemia and so she was scheduled to come in for evaluation with  angiography.   PROCEDURE NOTE:  The patient was performed via the right femoral artery  using arterial sheath and 4-French preformed coronary catheters. A front  wall arterial puncture was performed and Omnipaque contrast was used. The  patient tolerated the procedure well and left the laboratory in the  satisfactory condition.   RESULTS:  The aortic pressure was 121/67 with a mean of 89.  The left  ventricular pressure was 121/17.   The left main coronary artery was free of significant disease.   The left anterior descending artery gave rise to three septal perforators  and three diagonal branches. The second diagonal branch was totally occluded  and filled faintly by collaterals. This is was a new finding since the  previous study.   The circumflex artery gave rise to two small marginal branches, an atrial  branch, a small posterolateral, and a large posterolateral branch. These  vessels were free of major obstruction.   The right coronary artery is a moderate size vessel and gave rise to two  right ventricular branches, a posterior descending branch, and two  posterolateral branches. There was less 10% stenosis at the stent site in  the mid right coronary  artery. There was diffuse irregularity and plaque  throughout much of the vessel with 50% segmental narrowing of the proximal  right coronary artery and 40% narrowing of the distal right coronary artery.  The posterior descending and posterolateral branches were small caliber  vessels with diffuse irregularities.   The left ventriculogram performed in the RAO projection showed very slight  hypokinesis of the anterolateral wall. The overall wall motion was good with  an ejection fraction of 60%.   CONCLUSION:  Coronary artery disease, status post prior stenting of the  right coronary artery with less than 10% stenosis at the stent site in the  mid right coronary artery, 50% proximal and 40% distal stenosis in the right  coronary artery, no significant obstruction of the circumflex artery, 40%  and 30% narrowing in the mid right coronary artery with a new total  occlusion of the second diagonal branch of the left anterior descending with  mild anterolateral wall hypokinesis and an estimated ejection fraction of  60%.   RECOMMENDATIONS:  The patient has had progression of disease with occlusion  of a small diagonal branch of the LAD associated  with small wall motion  abnormality. I think medical therapy is indicated. Unfortunately she  continues to smoke and it would be very important for her to discontinue  cigarettes. Will arrange follow-up with Dr. [name].           ______________________________  Charlies Constable, M.D. [facility]     BB/MEDQ  D:  01/14/2005  T:  01/14/2005  Job:  [id number]   cc:   Charlton Haws, M.D.  [address]  [address]  [place]  [place] [address]   Thomos Lemons, D.O. [facility]  [address] [place], [place] [address]   Cardiopulmonary Lab

## 2010-08-29 NOTE — Patient Instructions (Signed)
Take furosemide 40 mg - 1.5 tabs x 1 week, then resume once daily Decrease sodium intake - 2.5 grams per day

## 2010-08-29 NOTE — Assessment & Plan Note (Signed)
[facility]                            CARDIOLOGY OFFICE NOTE   NAME:Kohler, RIKIA SUKHU                     MRN:          [id number]  DATE:05/13/2006                            DOB:          [birth year]    [name] Bean returns today for follow up.  She is her usual cantankerous  self.   She has been off her cigarettes for a year.  I congratulated her on  this.  Her diabetes have been fairly well controlled with hemoglobin  A1cs in the 6 range.   She has had previous stenting in the right coronary artery with mild  left sided disease.  She has not wanted to take aspirin and Plavix.  She  gets severe indigestion.  We have maintained her just on Plavix.   The last time we checked her lipids, her LDL was 71 in July of 2007 and  her liver function tests were normal.   She has active.  She goes to the [employer] 3 days a week.  She actually  walked the 5k ladies only race this year.   She has not had any paroxysmal nocturnal dyspnea, orthopnea, palpations  or syncope.   MEDICATIONS:  1. Crestor 10 mg a day.  2. Plavix 75 mg a day.  3. Glucophage a gram b.i.d.  4. Toprol 100 mg a day.  5. Diovan 80/12.5 mg.  6. Amaryl half a 1 mg tablet b.i.d.  7. Byetta 5 mg b.i.d.   PHYSICAL EXAMINATION:  Her weight is stable, blood pressure 130/70,  pulse 70.  HEENT:  Normal.  There is no thyromegaly.  There is no lymphadenopathy and no carotid  bruits.  LUNGS:  Clear.  Normal S1, S2 with normal heart sounds.  ABDOMEN:  Benign.  LOWER EXTREMITIES:  Good pulses, no edema.   EKG is totally normal.   IMPRESSION:  Stable with previous PTCA stents to the right coronary  artery.  Follow up Myoview in 6 months.  Continue activity level.  She  needs a new script for Nitroglycerin.   Her lipids are well controlled on Crestor and her liver function tests  are normal.   I encouraged her to take a baby aspirin with her Plavix should she have  any chest pain.   She  will continue to take her Diovan for her blood pressure control as  well as Protein sparing effects given her diabetes.  Overall I am  pleased with her progress and I will see her back in 6 months after she  has her stress test.     Theron Arista C. [name], MD, [facility]  Electronically Signed    PCN/MedQ  DD: 05/13/2006  DT: 05/13/2006  Job #: [id number]

## 2010-08-29 NOTE — Assessment & Plan Note (Signed)
[name]                              CARDIOLOGY OFFICE NOTE   NAME:[name] [facility], [name] Bean                     MRN:          [id number]  DATE:11/06/2005                            DOB:          1950/08/20    HISTORY:  [name] Bean is seen today in followup.  She is her usual energetic  self.  She has not smoked since mid-January.  I congratulated her on this.  She still is on three oral hypoglycemics.   The patient has had prior stenting of the right coronary artery and moderate  left-sided disease.  Her ejection fraction is normal with no significant  chest pain.  She does not want to take both aspirin and Plavix, and we have  had her on regular Plavix.  She was concerned about the price of her  medications.  We switched her from Diovan to Benicar and will switch her  over to generic beta blockers.  We gave her lots of samples of Crestor  today.   She is otherwise doing well.  She is traveling in [place] with her  mother and sister for a vacation.  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   She has a new copy of the [employer] book today which she enjoys reading.   In short she has been more active, not smoking and compliant with her  medications, although reluctantly due to the costs.   PHYSICAL EXAMINATION:  GENERAL:  She looks well.  VITAL SIGNS:  Blood pressure 120/70, pulse 80 and regular.  LUNGS:  Clear.  NECK:  Carotids are normal.  HEART:  S1, S2.  Normal heart sounds.  ABDOMEN:  Benign.  EXTREMITIES:  Normal peripheral pulses.  No edema.   IMPRESSION:  Stable coronary artery disease.  Good risk factor modification.   PLAN:  Follow up with me in six months.  We will check liver function tests  and a fasting lipid profile then.  She will substitute generics for her  current medications and call us if she has any problems.  She does have  nitroglycerin at home.   She will continue to abstain from her cigarettes.                              [name]. [name], MD, [facility]    PCN/MedQ  DD:  11/06/2005  DT:  11/06/2005  Job #:  [id number]

## 2010-08-29 NOTE — Cardiovascular Report (Signed)
[name] Bean, [name] Bean              ACCOUNT NO.:  [id number]   MEDICAL RECORD NO.:  [id number]          PATIENT TYPE:  OIB   LOCATION:  6501                         FACILITY:  [facility]   PHYSICIAN:  Charlies Constable, M.D. [facility] DATE OF BIRTH:  April 22, 1950   DATE OF PROCEDURE:  05/30/2004  DATE OF DISCHARGE:                              CARDIAC CATHETERIZATION   INDICATIONS:  Ms. Giese is 60 years old and has multiple risk factors  including diabetes, hypertension, and smoking.  She works in Animal nutritionist for [employer].  She recently developed symptoms of shortness of breath  with exertion and had a Cardiolite scan performed which suggested anterior  ischemia.  She was seen in consultation by Dr. [name], who arranged for her  to come in today for outpatient catheterization.   PROCEDURE:  The procedure was performed via the right femoral artery, using  an arterial sheath and 6 [place] preformed coronary catheters.  A front wall  arterial punch was performed and Omnipaque contrast was used.  Distal  aortogram was performed to rule out renovascular causes for hypertension.  The patient tolerated the procedure well and left the laboratory in  satisfactory condition.   RESULTS:  The aortic pressure was 121/66 with a mean of 89, left ventricular  pressure was 121/8.   The left main coronary artery was free of significant disease.   The left anterior descending artery gave rise to two diagonal branches and  four septal perforators.  The LAD was irregular throughout most of its  course and then with 40% narrowing in the mid portion.   The circumflex artery gave rise to an atrial branch, two small marginal  branches, and a posterolateral branch.  These vessels had irregularity but  there was no significant obstruction.   The right coronary artery was a moderate sized vessel that gave rise to two  right ventricular branches, the posterior descending branch, and two  posterolateral branches.   There were tandem 70 and 80-90% stenoses in the  mid vessel.  This vessel was free of major obstruction.   LEFT VENTRICULOGRAM:  The left ventriculogram performed in the RAO  projection showed good wall motion with no areas of hypokinesis.  The  estimated ejection fraction was 60%.   A distal aortogram was performed which showed patent renal arteries and no  significant aortic-iliac obstruction.   CONCLUSION:  Coronary artery disease with 40% narrowing in the mid left  anterior descending artery, no significant obstruction the circumflex  artery, 70 and 90% stenosis in the mid right coronary artery and normal LV  function.   RECOMMENDATIONS:  The patient has had symptoms of dyspnea on exertion which  may be angina and she does have a tight lesion in the mid right coronary  artery.  Although this is not in the location of her abnormality on her  Cardiolite scan, it does look tight enough to be flow limiting and may be  responsible for some of her symptomatology.  I discussed the findings with  Dr. [name] and we will plan percutaneous coronary intervention in the next  week.  BB/MEDQ  D:  05/30/2004  T:  05/30/2004  Job:  [id number]   cc:   Charlton Haws, M.D.   Thomos Lemons, D.O. [facility]   Cardiopulmonary Lab

## 2010-08-29 NOTE — Progress Notes (Signed)
Subjective:    Patient ID: [name], female    DOB: August 03, 1950, 60 y.o.   MRN: [id number]  HPI 60 y/o AA female for fu re:  Left LE swelling.  Pt seen by NP.  LE dopplers were negative for DVT.  She denies orthopnea.  She has been trying to follow a lower salt diet but still likes "veggie sticks".  She denies pain in ankle.  No redness.  She was started on empiric keflex for possible cellulitis.  Review of Systems Weight gain.  She has been taking extra dose of lasix 40 mg since prev OV. Past Medical History  Diagnosis Date  . Diabetes mellitus   . CAD (coronary artery disease)   . Hyperlipidemia   . Unspecified essential hypertension   . Spinal stenosis of lumbar region   . Maxillary sinusitis   . Candidiasis, vagina   . Other B-complex deficiencies   . Anemia   . Gastroenteritis   . Cerumen impaction   . Nausea   . Lumbar back pain   . Fatigue   . Knee pain, left   . Hot flashes, menopausal   . Bursitis of left hip   . Tubulovillous adenoma of colon   . Otitis media, acute     left  . Colon, diverticulosis   . Anxiety   . Depression   . Peripheral neuropathy   . Asthma   . Bronchitis   . Muscle tear     right gluteus    History   Social History  . Marital Status: Single    Spouse Name: N/A    Number of Children: N/A  . Years of Education: N/A   Occupational History  . SR DEV OFFICER     [employer]   Social History Main Topics  . Smoking status: Former Games developer  . Smokeless tobacco: Not on file  . Alcohol Use: Yes  . Drug Use: Not on file  . Sexually Active: Not on file   Other Topics Concern  . Not on file   Social History Narrative  . No narrative on file    Past Surgical History  Procedure Date  . Cardiac catheterization 2009, 2006  . Cervical spine surgery 2003  . Coronary angioplasty with stent placement 2002    Family History  Problem Relation Age of Onset  . Hypertension Mother   . Diabetes Mother   . Pneumonia Father    deceased  . Alcohol abuse Father     No Known Allergies  Current Outpatient Prescriptions on File Prior to Visit  Medication Sig Dispense Refill  . cephALEXin (KEFLEX) 500 MG capsule Take 1 capsule (500 mg total) by mouth 4 (four) times daily.  40 capsule  0  . clopidogrel (PLAVIX) 75 MG tablet Take 75 mg by mouth daily.        . Cyanocobalamin (NASCOBAL) 500 MCG/0.1ML SOLN One spray to one nostril once weekly       . escitalopram (LEXAPRO) 10 MG tablet Take 10 mg by mouth daily.        . furosemide (LASIX) 20 MG tablet Take 20 mg by mouth daily.        . insulin glargine (LANTUS SOLOSTAR) 100 UNIT/ML injection Inject 36 Units into the skin at bedtime.       . insulin lispro (HUMALOG KWIKPEN) 100 UNIT/ML injection Inject 5-15 Units into the skin 4 (four) times daily. Sliding scale      . Insulin Pen Needle (B-D ULTRAFINE III  SHORT PEN) 31G X 8 MM MISC Use daily for Victoza injection       . metoprolol (TOPROL-XL) 50 MG 24 hr tablet Take 1 tablet (50 mg total) by mouth daily.  30 tablet  5  . ONETOUCH DELICA LANCETS MISC Use four times daily as directed       . rosuvastatin (CRESTOR) 10 MG tablet Take 10 mg by mouth daily.        . valsartan-hydrochlorothiazide (DIOVAN-HCT) 160-12.5 MG per tablet Take 1 tablet by mouth daily.  30 tablet  2  . Polyethylene Glycol 3350 (MIRALAX PO) Take 17 g by mouth 2 (two) times daily. As directed         BP 110/60  Pulse 64  Temp(Src) 98.4 F (36.9 C) (Oral)  Resp 18  SpO2 97%       Objective:   Physical Exam    Constitutional: Appears well-developed and well-nourished. No distress.  Cardiovascular: Normal rate, regular rhythm and normal heart sounds.  Exam reveals no gallop and no friction rub.   No murmur heard. Pulmonary/Chest: Effort normal and breath sounds normal.  No wheezes. No rales.  Abdominal: Soft. Bowel sounds are normal. No mass. There is no tenderness.  Lymph:  No inguinal lymphadenopathy Neurological: Alert. No cranial nerve  deficit.  Ext:  Left +2 LE pitting edema,  Right +1 pitting edema  Psychiatric: Normal mood and affect. Behavior is normal.    Assessment & Plan:

## 2010-08-29 NOTE — Assessment & Plan Note (Signed)
Pt followed by endo.  Dr. Sharl Ma She reports last A1c improved to 7.1

## 2010-08-30 LAB — MICROALBUMIN / CREATININE URINE RATIO
Creatinine, Urine: 57.7 mg/dL
Microalb Creat Ratio: 8.7 mg/g (ref 0.0–30.0)

## 2010-08-30 LAB — BASIC METABOLIC PANEL WITH GFR
Calcium: 9.1 mg/dL (ref 8.4–10.5)
GFR, Est African American: >60 mL/min (ref >60)
GFR, Est Non African American: >60 mL/min (ref >60)
Glucose, Bld: 213 mg/dL — ABNORMAL HIGH (ref 70–99)
Potassium: 4.3 mEq/L (ref 3.5–5.3)
Sodium: 141 mEq/L (ref 135–145)

## 2010-08-30 LAB — BRAIN NATRIURETIC PEPTIDE: Brain Natriuretic Peptide: 14.1 pg/mL (ref 0.0–100.0)

## 2010-09-04 ENCOUNTER — Other Ambulatory Visit: Payer: Self-pay | Admitting: Internal Medicine

## 2010-09-04 ENCOUNTER — Telehealth: Payer: Self-pay | Admitting: Internal Medicine

## 2010-09-04 MED ORDER — INSULIN GLARGINE 100 UNIT/ML ~~LOC~~ SOLN: 36.0000 [IU] | Freq: Every day | SUBCUTANEOUS | Status: DC

## 2010-09-04 NOTE — Telephone Encounter (Signed)
Pt states that she is out of lantus and the pharmacist at [facility] on [address] told her to call for a refill.

## 2010-09-04 NOTE — Telephone Encounter (Signed)
Rx refill for Lantus sent to pharmacy

## 2010-09-04 NOTE — Telephone Encounter (Signed)
Rx refill sent to pharmacy. 

## 2010-09-09 ENCOUNTER — Telehealth: Payer: Self-pay | Admitting: Internal Medicine

## 2010-09-09 NOTE — Telephone Encounter (Signed)
Refill-humalog 100 units/ml kwikpen. 5-10 units before meal two times a day as directed. Qty 15.94ml. Last fill 4.11.12

## 2010-09-10 ENCOUNTER — Other Ambulatory Visit: Payer: Self-pay | Admitting: Internal Medicine

## 2010-09-10 NOTE — Telephone Encounter (Signed)
Spoke to [place] at pharmacy and notified him that pt is being followed by Dr Sharl Ma (endo) and we have never filled Humalog for pt before. He will send request to Dr Sharl Ma.

## 2010-09-12 ENCOUNTER — Other Ambulatory Visit: Payer: Self-pay | Admitting: *Deleted

## 2010-09-12 MED ORDER — INSULIN LISPRO 100 UNIT/ML ~~LOC~~ SOLN: 5.0000 [IU] | Freq: Four times a day (QID) | SUBCUTANEOUS | Status: DC

## 2010-09-12 NOTE — Telephone Encounter (Signed)
Patient called requesting refill on Humalog and stated she was informed by pharmacist Rx was denied. She was advised the refill would be sent to pharmacy.  Rx for Humalog has been sent

## 2010-10-16 ENCOUNTER — Telehealth: Payer: Self-pay | Admitting: Internal Medicine

## 2010-10-16 MED ORDER — ESCITALOPRAM OXALATE 10 MG PO TABS: 10.0000 mg | ORAL_TABLET | Freq: Every day | ORAL | Status: DC

## 2010-10-16 NOTE — Telephone Encounter (Signed)
Rx refill sent to pharmacy. 

## 2010-10-16 NOTE — Telephone Encounter (Signed)
Refill- escitalopram 10mg  tablet. Take one tablet by mouth every day. Qty 30. Last fill 5.24.12

## 2010-10-28 ENCOUNTER — Encounter: Payer: Self-pay | Admitting: Internal Medicine

## 2010-10-28 ENCOUNTER — Ambulatory Visit (INDEPENDENT_AMBULATORY_CARE_PROVIDER_SITE_OTHER): Payer: BC Managed Care – PPO | Admitting: Internal Medicine

## 2010-10-28 ENCOUNTER — Telehealth: Payer: Self-pay | Admitting: Internal Medicine

## 2010-10-28 DIAGNOSIS — G5600 Carpal tunnel syndrome, unspecified upper limb: Secondary | ICD-10-CM

## 2010-10-28 DIAGNOSIS — E785 Hyperlipidemia, unspecified: Secondary | ICD-10-CM

## 2010-10-28 DIAGNOSIS — E119 Type 2 diabetes mellitus without complications: Secondary | ICD-10-CM

## 2010-10-28 DIAGNOSIS — R635 Abnormal weight gain: Secondary | ICD-10-CM

## 2010-10-28 LAB — HEPATIC FUNCTION PANEL
ALT: 15 U/L (ref 0–35)
Albumin: 4.5 g/dL (ref 3.5–5.2)
Indirect Bilirubin: 0.4 mg/dL (ref 0.0–0.9)
Total Protein: 7.7 g/dL (ref 6.0–8.3)

## 2010-10-28 LAB — TSH: TSH: 0.981 u[IU]/mL (ref 0.350–4.500)

## 2010-10-28 NOTE — Patient Instructions (Signed)
Please schedule chem7, a1c 250.0 prior to next visit 

## 2010-10-28 NOTE — Telephone Encounter (Signed)
Refill- plavix 75mg  tablet. Take one tablet every day. Qty 30. Last fill 3.22.12

## 2010-10-29 LAB — HEMOGLOBIN A1C: Mean Plasma Glucose: 194 mg/dL — ABNORMAL HIGH (ref <117)

## 2010-10-29 MED ORDER — CLOPIDOGREL BISULFATE 75 MG PO TABS: 75.0000 mg | ORAL_TABLET | Freq: Every day | ORAL | Status: DC

## 2010-10-29 NOTE — Telephone Encounter (Signed)
Refill sent to pharmacy.   

## 2010-11-02 DIAGNOSIS — G5600 Carpal tunnel syndrome, unspecified upper limb: Secondary | ICD-10-CM | POA: Insufficient documentation

## 2010-11-02 NOTE — Assessment & Plan Note (Signed)
Obtain fasting lipid profile and liver function tests. 

## 2010-11-02 NOTE — Assessment & Plan Note (Signed)
Begin bilateral wrist splints. Prescription provided

## 2010-11-02 NOTE — Progress Notes (Signed)
  Subjective:    Patient ID: [name], female    DOB: 12-Apr-1951, 60 y.o.   MRN: [id number]  HPI Patient presents to clinic for evaluation of multiple medical problems. History of carpal tunnel effecting left hand right greater than right with finger numbness. Symptoms worse at night. History of diabetes using Lantus 60 units q.h.s. Without hypoglycemia. Morning blood sugars in the low 200s. Blood pressure viewed as normal. Tolerate statin therapy without myalgias or abnormal LFTs. No other complaints.  Reviewed past medical history, medications and allergies  Review of Systems see history of present illness     Objective:   Physical Exam    Physical Exam  Vitals reviewed. Constitutional:  appears well-developed and well-nourished. No distress.  HENT:  Head: Normocephalic and atraumatic.  Nose: Nose normal.  Mouth/Throat: Oropharynx is clear and moist. No oropharyngeal exudate.  Eyes: Conjunctivae and EOM are normal.  Right eye exhibits no discharge. Left eye exhibits no discharge. No scleral icterus.  Neck: Neck supple. No thyromegaly present. No carotid bruits Cardiovascular: Normal rate, regular rhythm and normal heart sounds.  Exam reveals no gallop and no friction rub.   No murmur heard. Pulmonary/Chest: Effort normal and breath sounds normal. No respiratory distress.  has no wheezes.  has no rales.  Lymphadenopathy:   no cervical adenopathy.  Neurological:  is alert.  Skin: Skin is warm and dry.  not diaphoretic.  Psychiatric: normal mood and affect.  Muscle skeletal: Positive Phalen's on the left without thenar muscle wasting     Assessment & Plan:

## 2010-11-02 NOTE — Assessment & Plan Note (Signed)
Obtain A1c. Increase Lantus 60 units and then increase Lantus 3 units every 3 days until fasting blood sugars consistently less than 130 without hypoglycemia.

## 2010-11-04 NOTE — Telephone Encounter (Signed)
Rx refill has been addressed

## 2010-11-26 ENCOUNTER — Telehealth: Payer: Self-pay | Admitting: Internal Medicine

## 2010-11-26 MED ORDER — INSULIN GLARGINE 100 UNIT/ML ~~LOC~~ SOLN: 78.0000 [IU] | Freq: Every day | SUBCUTANEOUS | Status: DC

## 2010-11-26 NOTE — Telephone Encounter (Signed)
Refill- lantus solostar 78 units at night. 3 boxes/58 day supply.  Pharmacy comments: gave 1 pen at n/c.

## 2010-11-26 NOTE — Telephone Encounter (Signed)
Rx refill sent to pharmacy. 

## 2010-11-28 ENCOUNTER — Telehealth: Payer: Self-pay | Admitting: Internal Medicine

## 2010-11-28 MED ORDER — INSULIN LISPRO 100 UNIT/ML ~~LOC~~ SOLN: 10.0000 [IU] | Freq: Three times a day (TID) | SUBCUTANEOUS | Status: DC

## 2010-11-28 NOTE — Telephone Encounter (Signed)
Call placed to patient at [phone]; she stated she is currently using  Lantus 78 QHS , Humalog 10-12 three times a day before meals. She was informed Rx sent to pharmacy

## 2010-11-28 NOTE — Telephone Encounter (Signed)
Patient states that she is using more lantus and humalog than previous rx states. She is running out of meds. Please send refill to [facility] on [address].

## 2010-12-26 ENCOUNTER — Other Ambulatory Visit: Payer: Self-pay | Admitting: Internal Medicine

## 2011-01-02 ENCOUNTER — Encounter: Payer: Self-pay | Admitting: Internal Medicine

## 2011-01-02 ENCOUNTER — Ambulatory Visit (INDEPENDENT_AMBULATORY_CARE_PROVIDER_SITE_OTHER): Payer: BC Managed Care – PPO | Admitting: Internal Medicine

## 2011-01-02 DIAGNOSIS — R609 Edema, unspecified: Secondary | ICD-10-CM

## 2011-01-02 DIAGNOSIS — I509 Heart failure, unspecified: Secondary | ICD-10-CM

## 2011-01-02 DIAGNOSIS — R635 Abnormal weight gain: Secondary | ICD-10-CM

## 2011-01-02 DIAGNOSIS — I10 Essential (primary) hypertension: Secondary | ICD-10-CM

## 2011-01-02 LAB — BASIC METABOLIC PANEL
CO2: 29 mEq/L (ref 19–32)
Chloride: 102 mEq/L (ref 96–112)
Creatinine, Ser: 0.7 mg/dL (ref 0.4–1.2)
Sodium: 141 mEq/L (ref 135–145)

## 2011-01-02 LAB — LIPID PANEL
Cholesterol: 190 mg/dL (ref 0–200)
LDL Cholesterol: 104 mg/dL — ABNORMAL HIGH (ref 0–99)
Total CHOL/HDL Ratio: 4

## 2011-01-02 LAB — BRAIN NATRIURETIC PEPTIDE: Pro B Natriuretic peptide (BNP): 31 pg/mL (ref 0.0–100.0)

## 2011-01-02 LAB — HEMOGLOBIN A1C: Hgb A1c MFr Bld: 8.1 % — ABNORMAL HIGH (ref 4.6–6.5)

## 2011-01-02 MED ORDER — POTASSIUM CHLORIDE CRYS ER 20 MEQ PO TBCR: 20.0000 meq | EXTENDED_RELEASE_TABLET | Freq: Every day | ORAL | Status: DC

## 2011-01-02 MED ORDER — ROSUVASTATIN CALCIUM 10 MG PO TABS: 10.0000 mg | ORAL_TABLET | Freq: Every day | ORAL | Status: DC

## 2011-01-02 MED ORDER — CLOPIDOGREL BISULFATE 75 MG PO TABS: 75.0000 mg | ORAL_TABLET | Freq: Every day | ORAL | Status: DC

## 2011-01-02 MED ORDER — EXENATIDE 5 MCG/0.02ML ~~LOC~~ SOPN: 5.0000 ug | PEN_INJECTOR | Freq: Two times a day (BID) | SUBCUTANEOUS | Status: DC

## 2011-01-02 MED ORDER — TORSEMIDE 20 MG PO TABS: 20.0000 mg | ORAL_TABLET | Freq: Every day | ORAL | Status: DC

## 2011-01-02 MED ORDER — ESCITALOPRAM OXALATE 10 MG PO TABS: 10.0000 mg | ORAL_TABLET | Freq: Every day | ORAL | Status: DC

## 2011-01-02 NOTE — Progress Notes (Signed)
Subjective:    Patient ID: [name], female    DOB: 07/24/1950, 60 y.o.   MRN: [id number]  HPI  60 year old [place] American female with history of type 2 diabetes, coronary artery disease, hypertension for followup. Patient complains of progressive weight gain. At previous visits she was started on insulin therapy and has been working with a diabetes specialist and her Lantus dose has been titrated to 78 units once daily. Patient is using mealtime insulin 14 units 3 times a day. She has gained approximately 30 pounds. She is trying her best to follow a diabetic diet but still admits to occasional binge eating.  She wants to try the new weight loss medication.  Patient also complains of worsening lower extremity edema left greater than right. This is despite taking Lasix 40 mg once daily. She typically takes valsartan hydrochlorothiazide but has not been able to obtain this from her pharmacy.  She is out of work since July. She is currently interviewing at [employer] [place]. This has added additional stress in her life. She is also helping to take care of the newborn for a neighbor.  Review of Systems Negative for chest pain, negative for shortness of breath  Past Medical History  Diagnosis Date  . Diabetes mellitus   . CAD (coronary artery disease)   . Hyperlipidemia   . Unspecified essential hypertension   . Spinal stenosis of lumbar region   . Maxillary sinusitis   . Candidiasis, vagina   . Other B-complex deficiencies   . Anemia   . Gastroenteritis   . Cerumen impaction   . Nausea   . Lumbar back pain   . Fatigue   . Knee pain, left   . Hot flashes, menopausal   . Bursitis of left hip   . Tubulovillous adenoma of colon   . Otitis media, acute     left  . Colon, diverticulosis   . Anxiety   . Depression   . Peripheral neuropathy   . Asthma   . Bronchitis   . Muscle tear     right gluteus    History   Social History  . Marital Status: Single    Spouse  Name: N/A    Number of Children: N/A  . Years of Education: N/A   Occupational History  . SR DEV OFFICER     [employer]   Social History Main Topics  . Smoking status: Former Games developer  . Smokeless tobacco: Not on file  . Alcohol Use: Yes  . Drug Use: Not on file  . Sexually Active: Not on file   Other Topics Concern  . Not on file   Social History Narrative  . No narrative on file    Past Surgical History  Procedure Date  . Cardiac catheterization 2009, 2006  . Cervical spine surgery 2003  . Coronary angioplasty with stent placement 2002    Family History  Problem Relation Age of Onset  . Hypertension Mother   . Diabetes Mother   . Pneumonia Father     deceased  . Alcohol abuse Father     No Known Allergies  Current Outpatient Prescriptions on File Prior to Visit  Medication Sig Dispense Refill  . Cyanocobalamin (NASCOBAL) 500 MCG/0.1ML SOLN One spray to one nostril once weekly       . insulin glargine (LANTUS) 100 UNIT/ML injection Inject 78 Units into the skin at bedtime.  30 mL  3  . insulin lispro (HUMALOG KWIKPEN) 100 UNIT/ML injection Inject  10-12 Units into the skin 4 (four) times daily -  before meals and at bedtime. Sliding scale  10 mL  3  . Insulin Pen Needle (B-D ULTRAFINE III SHORT PEN) 31G X 8 MM MISC Use daily for Victoza injection       . metoprolol (TOPROL-XL) 50 MG 24 hr tablet Take 1 tablet (50 mg total) by mouth daily.  30 tablet  5  . ONETOUCH DELICA LANCETS MISC Use four times daily as directed       . Polyethylene Glycol 3350 (MIRALAX PO) Take 17 g by mouth 2 (two) times daily. As directed         BP 130/76  Pulse 88  Temp(Src) 99.7 F (37.6 C) (Oral)  Wt 196 lb (88.905 kg)  SpO2 98%       Objective:   Physical Exam   Constitutional: Appears well-developed and well-nourished. No distress.  Head: Normocephalic and atraumatic.  Eyes: Conjunctivae are normal. Pupils are equal, round, and reactive to light.  Neck: Normal range of motion.  Neck supple. No thyromegaly present. No carotid bruit Cardiovascular: Normal rate, regular rhythm and normal heart sounds.  Exam reveals no gallop and no friction rub.   No murmur heard. Pulmonary/Chest: Effort normal and breath sounds normal.  No wheezes. No rales.  Abdominal: Soft. Bowel sounds are normal. No mass. There is no tenderness.  Neurological: Alert. No cranial nerve deficit.  Ext:  2+ edema left leg, +1 edema right leg, no calf tenderness or redness Skin: Skin is warm and dry.  Psychiatric: Normal mood and affect. Behavior is normal.        Assessment & Plan:

## 2011-01-02 NOTE — Patient Instructions (Addendum)
Monitor for hypoglycemia when you start byetta. Monitor your blood pressure at home

## 2011-01-02 NOTE — Assessment & Plan Note (Addendum)
Her pharmacy is having difficulty stopping Diovan/hydrochlorothiazide. Patient given samples of Benicar/hydrochlorothiazide. Consider switching metoprolol to bisoprolol considering weight issues.

## 2011-01-02 NOTE — Assessment & Plan Note (Signed)
Patient experiencing significant weight gain since starting insulin therapy. She wants to try the new weight loss medication but I do not think this is a good idea. Add byetta therapy to insulin therapy.  Patient advised to monitor for hypoglycemia and adjust insulin doses accordingly.  (Decrease Lantus by 10 units, decreased no time insulin by 4-5 units) Pt encouraged to stay on diabetic diet as much as possible

## 2011-01-02 NOTE — Assessment & Plan Note (Signed)
I suspect most of weight gain is from insulin use however patient appears to be volume overloaded. Discontinue Lasix. Use Demadex 20 mg every morning.  Start potassium supplementation. BMET before next OV

## 2011-01-03 LAB — MICROALBUMIN / CREATININE URINE RATIO
Creatinine, Urine: 72.5 mg/dL
Microalb Creat Ratio: 9.2 mg/g (ref 0.0–30.0)
Microalb, Ur: 0.67 mg/dL (ref 0.00–1.89)

## 2011-01-05 ENCOUNTER — Telehealth: Payer: Self-pay | Admitting: Internal Medicine

## 2011-01-05 NOTE — Telephone Encounter (Signed)
Pt notified of test results.  See result note for details.  Pt advised to stop byetta Pt to follow up in office within 24 -48 hrs.  Pt will increase fluid intake.  Patient advised to call office if symptoms persist or worsen.

## 2011-01-06 ENCOUNTER — Encounter: Payer: Self-pay | Admitting: Internal Medicine

## 2011-01-06 ENCOUNTER — Ambulatory Visit (INDEPENDENT_AMBULATORY_CARE_PROVIDER_SITE_OTHER): Payer: BC Managed Care – PPO | Admitting: Internal Medicine

## 2011-01-06 VITALS — BP 124/72 | Temp 98.6°F | Wt 192.0 lb

## 2011-01-06 DIAGNOSIS — E1165 Type 2 diabetes mellitus with hyperglycemia: Secondary | ICD-10-CM

## 2011-01-06 DIAGNOSIS — R112 Nausea with vomiting, unspecified: Secondary | ICD-10-CM | POA: Insufficient documentation

## 2011-01-06 DIAGNOSIS — I10 Essential (primary) hypertension: Secondary | ICD-10-CM

## 2011-01-06 MED ORDER — ONDANSETRON HCL 4 MG PO TABS: 4.0000 mg | ORAL_TABLET | Freq: Three times a day (TID) | ORAL | Status: AC | PRN

## 2011-01-06 NOTE — Patient Instructions (Signed)
Start taking potassium supplement.  (Take extra dose tonight)

## 2011-01-06 NOTE — Progress Notes (Signed)
Subjective:    Patient ID: [name] Bean, female    DOB: Sep 08, 1950, 60 y.o.   MRN: [id number]  HPI  60 year old African American female recently seen for diabetes followup complains of nausea for 2-3 days.  She is unclear whether symptoms associated with start of Byetta. She used 2 doses.  She had mild low-grade fever at last visit and patient attributes her current symptoms to having stomach virus.  Her nausea and vomiting have resolved. She is able to tolerate normal food today.  She has been under more stress. A good friend is in the hospital with end-stage renal disease and is status post CABG.  BKA planned  Review of Systems Negative for abdominal pain,  negative for constipation or diarrhea  Past Medical History  Diagnosis Date  . Diabetes mellitus   . CAD (coronary artery disease)   . Hyperlipidemia   . Unspecified essential hypertension   . Spinal stenosis of lumbar region   . Maxillary sinusitis   . Candidiasis, vagina   . Other B-complex deficiencies   . Anemia   . Gastroenteritis   . Cerumen impaction   . Nausea   . Lumbar back pain   . Fatigue   . Knee pain, left   . Hot flashes, menopausal   . Bursitis of left hip   . Tubulovillous adenoma of colon   . Otitis media, acute     left  . Colon, diverticulosis   . Anxiety   . Depression   . Peripheral neuropathy   . Asthma   . Bronchitis   . Muscle tear     right gluteus    History   Social History  . Marital Status: Single    Spouse Name: N/A    Number of Children: N/A  . Years of Education: N/A   Occupational History  . SR DEV OFFICER     [employer]   Social History Main Topics  . Smoking status: Former Games developer  . Smokeless tobacco: Not on file  . Alcohol Use: Yes  . Drug Use: Not on file  . Sexually Active: Not on file   Other Topics Concern  . Not on file   Social History Narrative  . No narrative on file    Past Surgical History  Procedure Date  . Cardiac catheterization 2009, 2006  .  Cervical spine surgery 2003  . Coronary angioplasty with stent placement 2002    Family History  Problem Relation Age of Onset  . Hypertension Mother   . Diabetes Mother   . Pneumonia Father     deceased  . Alcohol abuse Father     No Known Allergies  Current Outpatient Prescriptions on File Prior to Visit  Medication Sig Dispense Refill  . clopidogrel (PLAVIX) 75 MG tablet Take 1 tablet (75 mg total) by mouth daily.  90 tablet  1  . Cyanocobalamin (NASCOBAL) 500 MCG/0.1ML SOLN One spray to one nostril once weekly       . escitalopram (LEXAPRO) 10 MG tablet Take 1 tablet (10 mg total) by mouth daily.  90 tablet  1  . insulin glargine (LANTUS) 100 UNIT/ML injection Inject 78 Units into the skin at bedtime.  30 mL  3  . insulin lispro (HUMALOG KWIKPEN) 100 UNIT/ML injection Inject 10-12 Units into the skin 4 (four) times daily -  before meals and at bedtime. Sliding scale  10 mL  3  . Insulin Pen Needle (B-D ULTRAFINE III SHORT PEN) 31G X 8 MM  MISC Use daily for Victoza injection       . metoprolol (TOPROL-XL) 50 MG 24 hr tablet Take 1 tablet (50 mg total) by mouth daily.  30 tablet  5  . olmesartan-hydrochlorothiazide (BENICAR HCT) 20-12.5 MG per tablet Take 1 tablet by mouth daily.      Letta Pate DELICA LANCETS MISC Use four times daily as directed       . Polyethylene Glycol 3350 (MIRALAX PO) Take 17 g by mouth 2 (two) times daily. As directed       . potassium chloride SA (K-DUR,KLOR-CON) 20 MEQ tablet Take 1 tablet (20 mEq total) by mouth daily.  90 tablet  1  . rosuvastatin (CRESTOR) 10 MG tablet Take 1 tablet (10 mg total) by mouth daily.  90 tablet  1  . torsemide (DEMADEX) 20 MG tablet Take 1 tablet (20 mg total) by mouth daily.  30 tablet  2    BP 124/72  Temp(Src) 98.6 F (37 C) (Oral)  Wt 192 lb (87.091 kg)       Objective:   Physical Exam   Constitutional: Appears well-developed and well-nourished. No distress.  Mouth/Throat: Mild redness of the oropharynx    Eyes: Conjunctivae are normal. Pupils are equal, round, and reactive to light.  Neck: Normal range of motion. Neck supple. No thyromegaly present. No carotid bruit Cardiovascular: Normal rate, regular rhythm and normal heart sounds.  Exam reveals no gallop and no friction rub.   No murmur heard. Pulmonary/Chest: Effort normal and breath sounds normal.  No wheezes. No rales.  Abdominal: Soft. Bowel sounds are normal. No mass. There is no tenderness.  Neurological: Alert. No cranial nerve deficit.  Skin: Skin is warm and dry.  Psychiatric: Normal mood and affect. Behavior is normal.      Assessment & Plan:

## 2011-01-06 NOTE — Assessment & Plan Note (Signed)
Nausea and vomiting presumed secondary to viral gastroenteritis. Rule out pancreatitis with recent restart of byetta. Byetta discontinued.  Use Zofran for nausea.  Slowly advance diet as tolerated

## 2011-01-07 LAB — BASIC METABOLIC PANEL
GFR: 118.24 mL/min (ref >60.00)
Glucose, Bld: 167 mg/dL — ABNORMAL HIGH (ref 70–99)
Potassium: 3.7 mEq/L (ref 3.5–5.1)
Sodium: 137 mEq/L (ref 135–145)

## 2011-01-07 LAB — HEPATIC FUNCTION PANEL
ALT: 14 U/L (ref 0–35)
Albumin: 4.2 g/dL (ref 3.5–5.2)
Total Bilirubin: 0.6 mg/dL (ref 0.3–1.2)

## 2011-01-07 LAB — CBC WITH DIFFERENTIAL/PLATELET
Basophils Absolute: 0 10*3/uL (ref 0.0–0.1)
Eosinophils Relative: 1.6 % (ref 0.0–5.0)
HCT: 37.6 % (ref 36.0–46.0)
Lymphs Abs: 1.1 10*3/uL (ref 0.7–4.0)
MCV: 91.7 fl (ref 78.0–100.0)
Monocytes Absolute: 0.4 10*3/uL (ref 0.1–1.0)
Platelets: 194 10*3/uL (ref 150.0–400.0)
RDW: 13.2 % (ref 11.5–14.6)

## 2011-01-09 LAB — POCT I-STAT GLUCOSE
Glucose, Bld: 214 — ABNORMAL HIGH
Operator id: 141321

## 2011-01-09 NOTE — Progress Notes (Signed)
Addended by: Simeon Craft on: 01/09/2011 03:05 PM   Modules accepted: Orders

## 2011-01-28 ENCOUNTER — Ambulatory Visit: Payer: BC Managed Care – PPO | Admitting: Internal Medicine

## 2011-03-11 ENCOUNTER — Other Ambulatory Visit: Payer: Self-pay | Admitting: *Deleted

## 2011-03-11 DIAGNOSIS — I10 Essential (primary) hypertension: Secondary | ICD-10-CM

## 2011-03-11 MED ORDER — VALSARTAN 160 MG PO TABS: 160.0000 mg | ORAL_TABLET | Freq: Every day | ORAL | Status: DC

## 2011-03-11 MED ORDER — FUROSEMIDE 40 MG PO TABS: 40.0000 mg | ORAL_TABLET | Freq: Every day | ORAL | Status: DC

## 2011-03-11 MED ORDER — POTASSIUM CHLORIDE CRYS ER 20 MEQ PO TBCR: 20.0000 meq | EXTENDED_RELEASE_TABLET | Freq: Every day | ORAL | Status: DC

## 2011-03-11 MED ORDER — TORSEMIDE 20 MG PO TABS: 20.0000 mg | ORAL_TABLET | Freq: Every day | ORAL | Status: DC

## 2011-03-11 MED ORDER — METOPROLOL SUCCINATE ER 50 MG PO TB24: 50.0000 mg | ORAL_TABLET | Freq: Every day | ORAL | Status: DC

## 2011-03-11 MED ORDER — HYDROCHLOROTHIAZIDE 12.5 MG PO CAPS: 12.5000 mg | ORAL_CAPSULE | Freq: Every day | ORAL | Status: DC

## 2011-03-11 MED ORDER — OLMESARTAN MEDOXOMIL-HCTZ 20-12.5 MG PO TABS: 1.0000 | ORAL_TABLET | Freq: Every day | ORAL | Status: DC

## 2011-03-11 MED ORDER — ROSUVASTATIN CALCIUM 10 MG PO TABS: 10.0000 mg | ORAL_TABLET | Freq: Every day | ORAL | Status: DC

## 2011-03-11 MED ORDER — CLOPIDOGREL BISULFATE 75 MG PO TABS: 75.0000 mg | ORAL_TABLET | Freq: Every day | ORAL | Status: DC

## 2011-03-11 MED ORDER — CYANOCOBALAMIN 500 MCG/0.1ML NA SOLN: NASAL | Status: DC

## 2011-03-11 MED ORDER — INSULIN LISPRO 100 UNIT/ML ~~LOC~~ SOLN: 10.0000 [IU] | Freq: Three times a day (TID) | SUBCUTANEOUS | Status: DC

## 2011-03-11 MED ORDER — INSULIN GLARGINE 100 UNIT/ML ~~LOC~~ SOLN: 78.0000 [IU] | Freq: Every day | SUBCUTANEOUS | Status: DC

## 2011-03-11 MED ORDER — ESCITALOPRAM OXALATE 10 MG PO TABS: 10.0000 mg | ORAL_TABLET | Freq: Every day | ORAL | Status: DC

## 2011-04-17 ENCOUNTER — Telehealth: Payer: Self-pay | Admitting: Internal Medicine

## 2011-04-17 NOTE — Telephone Encounter (Signed)
Pt wanted a benicar samples, they are up front ready for p/u, pt aware

## 2011-04-17 NOTE — Telephone Encounter (Signed)
Pt called and said that her insurance will not cover the sample bp med that was given to her. Pt is req a call back from Dr Artist Pais asap.

## 2011-04-17 NOTE — Telephone Encounter (Signed)
LMTCB

## 2011-04-17 NOTE — Telephone Encounter (Signed)
Is she referring to Diovan or benicar/hctz?

## 2011-06-16 ENCOUNTER — Other Ambulatory Visit: Payer: Self-pay | Admitting: Internal Medicine

## 2011-06-16 NOTE — Telephone Encounter (Signed)
Patient called back, wanting the samples. I stressed to her that she cannot wait until she is out, because we have a 72hr turn around time on meds. She was a little worried that we were upset. I assured her we were not, but that we did not have Lantus samples. I told her she can come pick up the Humalog. I asked her if the has scripts on these 2 insulins. She said she does, but is in between insurances, so she was hoping to get samples.

## 2011-06-16 NOTE — Telephone Encounter (Signed)
Pt would like samples of lantus and humalog

## 2011-06-16 NOTE — Telephone Encounter (Signed)
2 Humalog pens given. No Lantus available

## 2011-06-16 NOTE — Telephone Encounter (Signed)
Is this ok?

## 2011-06-19 ENCOUNTER — Other Ambulatory Visit: Payer: Self-pay | Admitting: *Deleted

## 2011-06-19 MED ORDER — INSULIN LISPRO 100 UNIT/ML ~~LOC~~ SOLN: 10.0000 [IU] | Freq: Three times a day (TID) | SUBCUTANEOUS | Status: DC

## 2011-07-27 ENCOUNTER — Telehealth: Payer: Self-pay | Admitting: Internal Medicine

## 2011-07-27 ENCOUNTER — Other Ambulatory Visit: Payer: Self-pay | Admitting: *Deleted

## 2011-07-27 MED ORDER — INSULIN LISPRO 100 UNIT/ML ~~LOC~~ SOLN: 10.0000 [IU] | Freq: Three times a day (TID) | SUBCUTANEOUS | Status: DC

## 2011-07-27 NOTE — Telephone Encounter (Signed)
Patient states that she spoke to Dr. Artist Pais yesterday and he told her to call our office. Patient would like to know if we would have samples of Lantus that she could have?

## 2011-07-27 NOTE — Telephone Encounter (Signed)
Call placed to patient at [phone], she was informed samples available for pick up.

## 2011-09-28 ENCOUNTER — Ambulatory Visit (INDEPENDENT_AMBULATORY_CARE_PROVIDER_SITE_OTHER): Payer: BC Managed Care – PPO | Admitting: Internal Medicine

## 2011-09-28 VITALS — BP 176/94 | HR 96 | Temp 98.4°F | Wt 185.0 lb

## 2011-09-28 DIAGNOSIS — R221 Localized swelling, mass and lump, neck: Secondary | ICD-10-CM

## 2011-09-28 DIAGNOSIS — R220 Localized swelling, mass and lump, head: Secondary | ICD-10-CM

## 2011-09-28 MED ORDER — HYDROCODONE-ACETAMINOPHEN 5-500 MG PO TABS: 1.0000 | ORAL_TABLET | Freq: Three times a day (TID) | ORAL | Status: DC | PRN

## 2011-09-28 MED ORDER — CLINDAMYCIN HCL 300 MG PO CAPS: 300.0000 mg | ORAL_CAPSULE | Freq: Three times a day (TID) | ORAL | Status: DC

## 2011-09-28 MED ORDER — CEFUROXIME AXETIL 500 MG PO TABS: 500.0000 mg | ORAL_TABLET | Freq: Two times a day (BID) | ORAL | Status: DC

## 2011-09-28 NOTE — Progress Notes (Signed)
Subjective:    Patient ID: [name] Bean, female    DOB: 10/17/1950, 61 y.o.   MRN: [id number]  HPI  61 year old African American female with history of uncontrolled diabetes and hypertension for urgent care followup. She was evaluated over the weekend for severe right ear pain. Patient diagnosed with possible serous otitis media and started on amoxicillin 875 twice daily and nasal fluticasone. Patient reports ear pain has not improved and now she has swelling below her right lower neck area. Area is firm to the touch. She denies fever or chills. She has chronic history of dental caries right lower gum line.  Review of Systems Blood sugars stable  Past Medical History  Diagnosis Date  . Diabetes mellitus   . CAD (coronary artery disease)   . Hyperlipidemia   . Unspecified essential hypertension   . Spinal stenosis of lumbar region   . Maxillary sinusitis   . Candidiasis, vagina   . Other B-complex deficiencies   . Anemia   . Gastroenteritis   . Cerumen impaction   . Nausea   . Lumbar back pain   . Fatigue   . Knee pain, left   . Hot flashes, menopausal   . Bursitis of left hip   . Tubulovillous adenoma of colon   . Otitis media, acute     left  . Colon, diverticulosis   . Anxiety   . Depression   . Peripheral neuropathy   . Asthma   . Bronchitis   . Muscle tear     right gluteus    History   Social History  . Marital Status: Single    Spouse Name: N/A    Number of Children: N/A  . Years of Education: N/A   Occupational History  . SR DEV OFFICER     [employer]   Social History Main Topics  . Smoking status: Former Games developer  . Smokeless tobacco: Not on file  . Alcohol Use: Yes  . Drug Use: Not on file  . Sexually Active: Not on file   Other Topics Concern  . Not on file   Social History Narrative  . No narrative on file    Past Surgical History  Procedure Date  . Cardiac catheterization 2009, 2006  . Cervical spine surgery 2003  . Coronary angioplasty  with stent placement 2002    Family History  Problem Relation Age of Onset  . Hypertension Mother   . Diabetes Mother   . Pneumonia Father     deceased  . Alcohol abuse Father     No Known Allergies  Current Outpatient Prescriptions on File Prior to Visit  Medication Sig Dispense Refill  . clopidogrel (PLAVIX) 75 MG tablet Take 1 tablet (75 mg total) by mouth daily.  90 tablet  1  . Cyanocobalamin (NASCOBAL) 500 MCG/0.1ML SOLN One spray to one nostril once weekly  2.3 mL  5  . escitalopram (LEXAPRO) 10 MG tablet Take 1 tablet (10 mg total) by mouth daily.  90 tablet  1  . fluticasone (FLONASE) 50 MCG/ACT nasal spray Place 1 spray into the nose 2 (two) times daily.      . furosemide (LASIX) 40 MG tablet Take 1 tablet (40 mg total) by mouth daily.  90 tablet  1  . hydrochlorothiazide (MICROZIDE) 12.5 MG capsule Take 1 capsule (12.5 mg total) by mouth daily.  90 capsule  1  . insulin glargine (LANTUS) 100 UNIT/ML injection Inject 78 Units into the skin at bedtime.  30  mL  3  . insulin lispro (HUMALOG KWIKPEN) 100 UNIT/ML injection Inject 10-12 Units into the skin 4 (four) times daily -  before meals and at bedtime. Sliding scale  3 mL  1  . Insulin Pen Needle (B-D ULTRAFINE III SHORT PEN) 31G X 8 MM MISC Use daily for Victoza injection       . metoprolol (TOPROL-XL) 50 MG 24 hr tablet Take 1 tablet (50 mg total) by mouth daily.  30 tablet  5  . olmesartan-hydrochlorothiazide (BENICAR HCT) 20-12.5 MG per tablet Take 1 tablet by mouth daily.  90 tablet  1  . ONETOUCH DELICA LANCETS MISC Use four times daily as directed       . Polyethylene Glycol 3350 (MIRALAX PO) Take 17 g by mouth 2 (two) times daily. As directed       . potassium chloride SA (K-DUR,KLOR-CON) 20 MEQ tablet Take 1 tablet (20 mEq total) by mouth daily.  90 tablet  1  . rosuvastatin (CRESTOR) 10 MG tablet Take 1 tablet (10 mg total) by mouth daily.  90 tablet  1  . torsemide (DEMADEX) 20 MG tablet Take 1 tablet (20 mg total)  by mouth daily.  90 tablet  1  . valsartan (DIOVAN) 160 MG tablet Take 1 tablet (160 mg total) by mouth daily.  90 tablet  1    BP 176/94  Pulse 96  Temp 98.4 F (36.9 C) (Oral)  Wt 185 lb (83.915 kg)       Objective:   Physical Exam  Constitutional: She is oriented to person, place, and time. She appears well-developed and well-nourished.  HENT:  Head: Normocephalic and atraumatic.  Right Ear: External ear normal.  Left Ear: External ear normal.  Mouth/Throat: Oropharynx is clear and moist.       Swelling and tenderness along right mandible.  Area is firm to touch.  No fluctuance  Cardiovascular: Normal rate, regular rhythm and normal heart sounds.   Pulmonary/Chest: Effort normal and breath sounds normal. She has no wheezes. She has no rales.  Neurological: She is alert and oriented to person, place, and time. No cranial nerve deficit.  Skin: Skin is warm and dry.          Assessment & Plan:

## 2011-09-28 NOTE — Patient Instructions (Addendum)
Please call our office if your symptoms do not improve or gets worse.  

## 2011-09-28 NOTE — Assessment & Plan Note (Signed)
61 year old [place] American female with uncontrolled type 2 diabetes presents with right lower neck swelling. Patient may have neck abscess.  Patient given Rocephin 100 mg IM x1. Antibiotics will be switched to cefuroxime 500 mg twice a day and clindamycin 300 milligrams 3 times a day. Patient understands if symptoms do not improve over the next 24-48 hours, patient may need hospitalization for IV antibiotics and CT of neck.

## 2011-09-30 MED ORDER — LIDOCAINE HCL 1 % IJ SOLN: 500.0000 mg | Freq: Once | INTRAMUSCULAR | Status: DC

## 2011-09-30 MED ORDER — CEFTRIAXONE SODIUM 1 G IJ SOLR
500.0000 mg | Freq: Once | INTRAMUSCULAR | Status: AC
  Administered 2011-09-28: 500 mg via INTRAMUSCULAR

## 2011-09-30 NOTE — Addendum Note (Signed)
Addended by: Alfred Levins D on: 09/30/2011 08:03 AM   Modules accepted: Orders

## 2011-10-01 ENCOUNTER — Ambulatory Visit (INDEPENDENT_AMBULATORY_CARE_PROVIDER_SITE_OTHER): Payer: Self-pay | Admitting: Internal Medicine

## 2011-10-01 ENCOUNTER — Encounter: Payer: Self-pay | Admitting: Internal Medicine

## 2011-10-01 ENCOUNTER — Ambulatory Visit (INDEPENDENT_AMBULATORY_CARE_PROVIDER_SITE_OTHER)
Admission: RE | Admit: 2011-10-01 | Discharge: 2011-10-01 | Disposition: A | Discharge status: Home / Self Care | Payer: Self-pay | Source: Ambulatory Visit | Attending: Internal Medicine | Admitting: Internal Medicine

## 2011-10-01 VITALS — BP 142/70 | Temp 98.8°F | Wt 185.0 lb

## 2011-10-01 DIAGNOSIS — R221 Localized swelling, mass and lump, neck: Secondary | ICD-10-CM

## 2011-10-01 DIAGNOSIS — R220 Localized swelling, mass and lump, head: Secondary | ICD-10-CM

## 2011-10-01 LAB — BASIC METABOLIC PANEL
BUN: 11 mg/dL (ref 6–23)
Chloride: 93 mEq/L — ABNORMAL LOW (ref 96–112)
GFR: 126.76 mL/min (ref >60.00)
Potassium: 3.7 mEq/L (ref 3.5–5.1)
Sodium: 135 mEq/L (ref 135–145)

## 2011-10-01 LAB — CBC WITH DIFFERENTIAL/PLATELET
Basophils Relative: 0.4 % (ref 0.0–3.0)
Eosinophils Absolute: 0.1 10*3/uL (ref 0.0–0.7)
Eosinophils Relative: 1.3 % (ref 0.0–5.0)
Lymphocytes Relative: 18 % (ref 12.0–46.0)
MCHC: 33.3 g/dL (ref 30.0–36.0)
Neutrophils Relative %: 72.3 % (ref 43.0–77.0)
Platelets: 258 10*3/uL (ref 150.0–400.0)
RBC: 4.43 Mil/uL (ref 3.87–5.11)

## 2011-10-01 MED ORDER — CEFTRIAXONE SODIUM 500 MG IJ SOLR: 500.0000 mg | Freq: Once | INTRAMUSCULAR | Status: DC

## 2011-10-01 MED ORDER — CEFTRIAXONE SODIUM 500 MG IJ SOLR
500.0000 mg | Freq: Once | INTRAMUSCULAR | Status: AC
  Administered 2011-10-01: 500 mg via INTRAMUSCULAR

## 2011-10-01 MED ORDER — IOHEXOL 300 MG/ML  SOLN
76.0000 mL | Freq: Once | INTRAMUSCULAR | Status: AC | PRN
  Administered 2011-10-01: 76 mL via INTRAVENOUS

## 2011-10-01 NOTE — Assessment & Plan Note (Signed)
Minimal improvement with antibiotic therapy.  Rule out neck abscess.  Obtain CT of Neck W contrast.  Refer to ENT.

## 2011-10-01 NOTE — Progress Notes (Signed)
Subjective:    Patient ID: [name] Bean, female    DOB: March 13, 1951, 61 y.o.   MRN: [id number]  HPI  61 year old African American female for followup regarding right lower neck swelling.  Despite starting with cefuroxime and clindamycin her neck swelling has not improved.  Area is still firm to touch and tender.  She was also given ceftriaxone 500 mg IM at last OV.  Review of Systems Negative for fever or chills  Past Medical History  Diagnosis Date  . Diabetes mellitus   . CAD (coronary artery disease)   . Hyperlipidemia   . Unspecified essential hypertension   . Spinal stenosis of lumbar region   . Maxillary sinusitis   . Candidiasis, vagina   . Other B-complex deficiencies   . Anemia   . Gastroenteritis   . Cerumen impaction   . Nausea   . Lumbar back pain   . Fatigue   . Knee pain, left   . Hot flashes, menopausal   . Bursitis of left hip   . Tubulovillous adenoma of colon   . Otitis media, acute     left  . Colon, diverticulosis   . Anxiety   . Depression   . Peripheral neuropathy   . Asthma   . Bronchitis   . Muscle tear     right gluteus    History   Social History  . Marital Status: Single    Spouse Name: N/A    Number of Children: N/A  . Years of Education: N/A   Occupational History  . SR DEV OFFICER     [employer]   Social History Main Topics  . Smoking status: Former Games developer  . Smokeless tobacco: Not on file  . Alcohol Use: Yes  . Drug Use: Not on file  . Sexually Active: Not on file   Other Topics Concern  . Not on file   Social History Narrative  . No narrative on file    Past Surgical History  Procedure Date  . Cardiac catheterization 2009, 2006  . Cervical spine surgery 2003  . Coronary angioplasty with stent placement 2002    Family History  Problem Relation Age of Onset  . Hypertension Mother   . Diabetes Mother   . Pneumonia Father     deceased  . Alcohol abuse Father     No Known Allergies  Current Outpatient  Prescriptions on File Prior to Visit  Medication Sig Dispense Refill  . cefUROXime (CEFTIN) 500 MG tablet Take 1 tablet (500 mg total) by mouth 2 (two) times daily.  20 tablet  0  . clindamycin (CLEOCIN) 300 MG capsule Take 1 capsule (300 mg total) by mouth 3 (three) times daily.  30 capsule  0  . clopidogrel (PLAVIX) 75 MG tablet Take 1 tablet (75 mg total) by mouth daily.  90 tablet  1  . Cyanocobalamin (NASCOBAL) 500 MCG/0.1ML SOLN One spray to one nostril once weekly  2.3 mL  5  . escitalopram (LEXAPRO) 10 MG tablet Take 1 tablet (10 mg total) by mouth daily.  90 tablet  1  . fluticasone (FLONASE) 50 MCG/ACT nasal spray Place 1 spray into the nose 2 (two) times daily.      . furosemide (LASIX) 40 MG tablet Take 1 tablet (40 mg total) by mouth daily.  90 tablet  1  . HYDROcodone-acetaminophen (VICODIN) 5-500 MG per tablet Take 1 tablet by mouth every 8 (eight) hours as needed for pain.  30 tablet  0  .  insulin glargine (LANTUS) 100 UNIT/ML injection Inject 78 Units into the skin at bedtime.  30 mL  3  . insulin lispro (HUMALOG KWIKPEN) 100 UNIT/ML injection Inject 10-12 Units into the skin 4 (four) times daily -  before meals and at bedtime. Sliding scale  3 mL  1  . Insulin Pen Needle (B-D ULTRAFINE III SHORT PEN) 31G X 8 MM MISC Use daily for Victoza injection       . metoprolol (TOPROL-XL) 50 MG 24 hr tablet Take 1 tablet (50 mg total) by mouth daily.  30 tablet  5  . olmesartan-hydrochlorothiazide (BENICAR HCT) 20-12.5 MG per tablet Take 1 tablet by mouth daily.  90 tablet  1  . ONETOUCH DELICA LANCETS MISC Use four times daily as directed       . Polyethylene Glycol 3350 (MIRALAX PO) Take 17 g by mouth 2 (two) times daily. As directed       . potassium chloride SA (K-DUR,KLOR-CON) 20 MEQ tablet Take 1 tablet (20 mEq total) by mouth daily.  90 tablet  1  . rosuvastatin (CRESTOR) 10 MG tablet Take 1 tablet (10 mg total) by mouth daily.  90 tablet  1  . torsemide (DEMADEX) 20 MG tablet Take 1  tablet (20 mg total) by mouth daily.  90 tablet  1    BP 142/70  Temp 98.8 F (37.1 C) (Oral)  Wt 185 lb (83.915 kg)       Objective:   Physical Exam  Constitutional: She appears well-developed and well-nourished.  Neck:       Right lower neck swelling. 2 x 4 cm oblong firm area that is tender  Cardiovascular: Normal rate, regular rhythm and normal heart sounds.   Pulmonary/Chest: Breath sounds normal. She has no wheezes.          Assessment & Plan:

## 2011-10-02 ENCOUNTER — Telehealth: Payer: Self-pay | Admitting: Internal Medicine

## 2011-10-02 NOTE — Telephone Encounter (Signed)
Entered as a rx in error.  Called pharmacy and told them to disregard

## 2011-10-02 NOTE — Telephone Encounter (Signed)
Pharmacist called re: cefTRIAXone (ROCEPHIN) injection 500 mg . Is doctor going to be administering the injs or the pt?

## 2011-10-05 ENCOUNTER — Telehealth: Payer: Self-pay | Admitting: Internal Medicine

## 2011-10-05 MED ORDER — HYDROCODONE-ACETAMINOPHEN 5-500 MG PO TABS: 1.0000 | ORAL_TABLET | Freq: Three times a day (TID) | ORAL | Status: AC | PRN

## 2011-10-05 MED ORDER — LEVOFLOXACIN 500 MG PO TABS: 500.0000 mg | ORAL_TABLET | Freq: Every day | ORAL | Status: AC

## 2011-10-05 NOTE — Telephone Encounter (Signed)
Patient called stating her neck swelling is not improving.  She denies fevers or chills.  She is currently taking cefuroxime 500 mg bid and clindamycin 300 mg 3 times a day. She has appointment with ENT on Thursday. I suggest switching to Levaquin 500 mg once daily x10 days. She also requests refill on pain medication.  Cindy, I send rx for levaquin and generic vicodin.   Please make sure vicodin rx went through.  If not, give verbal rx.  thx

## 2011-10-05 NOTE — Telephone Encounter (Signed)
rx faxed

## 2012-01-15 ENCOUNTER — Telehealth: Payer: Self-pay | Admitting: Internal Medicine

## 2012-01-15 DIAGNOSIS — I10 Essential (primary) hypertension: Secondary | ICD-10-CM

## 2012-01-15 MED ORDER — FUROSEMIDE 40 MG PO TABS: 40.0000 mg | ORAL_TABLET | Freq: Every day | ORAL | Status: DC

## 2012-01-15 MED ORDER — METOPROLOL SUCCINATE ER 50 MG PO TB24: 50.0000 mg | ORAL_TABLET | Freq: Every day | ORAL | Status: DC

## 2012-01-15 MED ORDER — INSULIN GLARGINE 100 UNIT/ML ~~LOC~~ SOLN: 78.0000 [IU] | Freq: Every day | SUBCUTANEOUS | Status: DC

## 2012-01-15 MED ORDER — INSULIN LISPRO 100 UNIT/ML ~~LOC~~ SOLN: 10.0000 [IU] | Freq: Three times a day (TID) | SUBCUTANEOUS | Status: DC

## 2012-01-15 MED ORDER — ROSUVASTATIN CALCIUM 10 MG PO TABS: 10.0000 mg | ORAL_TABLET | Freq: Every day | ORAL | Status: DC

## 2012-01-15 MED ORDER — OLMESARTAN MEDOXOMIL-HCTZ 20-12.5 MG PO TABS: 1.0000 | ORAL_TABLET | Freq: Every day | ORAL | Status: DC

## 2012-01-15 MED ORDER — ESCITALOPRAM OXALATE 10 MG PO TABS: 10.0000 mg | ORAL_TABLET | Freq: Every day | ORAL | Status: DC

## 2012-01-15 MED ORDER — CLOPIDOGREL BISULFATE 75 MG PO TABS: 75.0000 mg | ORAL_TABLET | Freq: Every day | ORAL | Status: DC

## 2012-01-15 NOTE — Telephone Encounter (Signed)
Rx sent to the pharmacy and pt notified.  She will ask around for PCP

## 2012-01-15 NOTE — Telephone Encounter (Signed)
Patient called stating that she has relocated to [facility] and would like the MD to give her advise on a good PCP in the area. Patient also need refills of her humalog,lantus,benicar,crestor,lexapro,metoprolol,plavix,furosemide, sent to [facility] on [address] in [place] ph. [phone]. Please advise and assist.

## 2012-01-15 NOTE — Telephone Encounter (Signed)
I do not know any PCPs in [place].  I suggest she ask her co workers.  Ok for 3 month refills of her meds to [facility] in [place].

## 2012-01-15 NOTE — Telephone Encounter (Signed)
Do you know of good PCP in the area?

## 2012-01-25 ENCOUNTER — Telehealth: Payer: Self-pay | Admitting: Internal Medicine

## 2012-01-25 MED ORDER — INSULIN LISPRO 100 UNIT/ML ~~LOC~~ SOLN: 10.0000 [IU] | Freq: Three times a day (TID) | SUBCUTANEOUS | Status: DC

## 2012-01-25 NOTE — Telephone Encounter (Signed)
rx wast resent electronically

## 2012-01-25 NOTE — Telephone Encounter (Signed)
Pt called and said that her pharmacy did not rcv 1 of her meds. Pt needs Humalog Flexpen. Pls call in today. Pt said that this should have been sent in with other meds on 01/15/12.  [facility] [address] fax # [phone] and phone # [phone].

## 2012-03-01 ENCOUNTER — Encounter: Payer: Self-pay | Admitting: Gastroenterology

## 2012-04-21 ENCOUNTER — Encounter: Payer: Self-pay | Admitting: Gastroenterology

## 2012-05-11 ENCOUNTER — Other Ambulatory Visit: Payer: Self-pay | Admitting: Internal Medicine

## 2012-06-03 ENCOUNTER — Other Ambulatory Visit: Payer: Self-pay | Admitting: Internal Medicine

## 2012-06-06 ENCOUNTER — Other Ambulatory Visit: Payer: Self-pay | Admitting: *Deleted

## 2012-06-06 ENCOUNTER — Telehealth: Payer: Self-pay | Admitting: Internal Medicine

## 2012-06-06 MED ORDER — OLMESARTAN MEDOXOMIL-HCTZ 20-12.5 MG PO TABS: 1.0000 | ORAL_TABLET | Freq: Every day | ORAL | Status: DC

## 2012-06-06 NOTE — Telephone Encounter (Signed)
Refill- benicar hct 20-12.5mg  tablet. Take one tablet by mouth every day. Qty 90 last fill 1.17.14

## 2012-06-06 NOTE — Telephone Encounter (Signed)
done

## 2012-08-03 ENCOUNTER — Encounter: Payer: Self-pay | Admitting: Gastroenterology

## 2012-10-20 ENCOUNTER — Other Ambulatory Visit: Payer: Self-pay

## 2013-03-15 ENCOUNTER — Other Ambulatory Visit: Payer: Self-pay | Admitting: Internal Medicine

## 2013-06-01 ENCOUNTER — Other Ambulatory Visit: Payer: Self-pay | Admitting: Internal Medicine

## 2014-05-29 ENCOUNTER — Other Ambulatory Visit: Payer: Self-pay | Admitting: Internal Medicine

## 2016-04-21 LAB — HEPATIC FUNCTION PANEL
ALT: 39 — AB (ref 7–35)
AST: 76 — AB (ref 13–35)
BILIRUBIN, TOTAL: 1.2

## 2016-04-21 LAB — BASIC METABOLIC PANEL
BUN: 10 (ref 4–21)
Creatinine: 1 (ref <=1.1)
GLUCOSE: 285
Potassium: 3.7 (ref 3.4–5.3)
SODIUM: 136 — AB (ref 137–147)

## 2016-06-20 DIAGNOSIS — I469 Cardiac arrest, cause unspecified: Secondary | ICD-10-CM

## 2016-06-20 HISTORY — DX: Cardiac arrest, cause unspecified: I46.9

## 2016-07-28 ENCOUNTER — Other Ambulatory Visit: Payer: Self-pay | Admitting: Neurosurgery

## 2016-07-28 DIAGNOSIS — M4712 Other spondylosis with myelopathy, cervical region: Secondary | ICD-10-CM

## 2016-08-04 ENCOUNTER — Other Ambulatory Visit: Payer: Self-pay | Admitting: Neurosurgery

## 2016-08-04 DIAGNOSIS — M4712 Other spondylosis with myelopathy, cervical region: Secondary | ICD-10-CM

## 2016-08-06 ENCOUNTER — Ambulatory Visit
Admission: RE | Admit: 2016-08-06 | Discharge: 2016-08-06 | Disposition: A | Discharge status: Home / Self Care | Payer: BC Managed Care – PPO | Source: Ambulatory Visit | Attending: Neurosurgery | Admitting: Neurosurgery

## 2016-08-06 DIAGNOSIS — M4712 Other spondylosis with myelopathy, cervical region: Secondary | ICD-10-CM

## 2016-08-17 ENCOUNTER — Other Ambulatory Visit: Payer: Self-pay | Admitting: Neurosurgery

## 2016-09-09 ENCOUNTER — Encounter (HOSPITAL_COMMUNITY)
Admission: RE | Admit: 2016-09-09 | Discharge: 2016-09-09 | Disposition: A | Discharge status: Home / Self Care | Payer: BC Managed Care – PPO | Source: Ambulatory Visit | Attending: Neurosurgery | Admitting: Neurosurgery

## 2016-09-09 ENCOUNTER — Encounter (HOSPITAL_COMMUNITY): Payer: Self-pay

## 2016-09-09 DIAGNOSIS — Z01818 Encounter for other preprocedural examination: Secondary | ICD-10-CM | POA: Diagnosis not present

## 2016-09-09 DIAGNOSIS — M4802 Spinal stenosis, cervical region: Secondary | ICD-10-CM | POA: Diagnosis not present

## 2016-09-09 HISTORY — DX: Unspecified fracture of shaft of humerus, unspecified arm, initial encounter for closed fracture: S42.309A

## 2016-09-09 HISTORY — DX: Cardiac arrest, cause unspecified: I46.9

## 2016-09-09 LAB — CBC
HEMATOCRIT: 33.8 % — AB (ref 36.0–46.0)
Hemoglobin: 11 g/dL — ABNORMAL LOW (ref 12.0–15.0)
MCH: 29.1 pg (ref 26.0–34.0)
MCHC: 32.5 g/dL (ref 30.0–36.0)
MCV: 89.4 fL (ref 78.0–100.0)
Platelets: 226 10*3/uL (ref 150–400)
RBC: 3.78 MIL/uL — ABNORMAL LOW (ref 3.87–5.11)
RDW: 12.9 % (ref 11.5–15.5)
WBC: 5.8 10*3/uL (ref 4.0–10.5)

## 2016-09-09 LAB — COMPREHENSIVE METABOLIC PANEL
ALT: 11 U/L — ABNORMAL LOW (ref 14–54)
AST: 19 U/L (ref 15–41)
Albumin: 3.8 g/dL (ref 3.5–5.0)
Alkaline Phosphatase: 73 U/L (ref 38–126)
Anion gap: 10 (ref 5–15)
BUN: 14 mg/dL (ref 6–20)
CHLORIDE: 99 mmol/L — AB (ref 101–111)
CO2: 27 mmol/L (ref 22–32)
Calcium: 9.3 mg/dL (ref 8.9–10.3)
Creatinine, Ser: 0.91 mg/dL (ref 0.44–1.00)
GFR calc Af Amer: >60 mL/min (ref >60)
GFR calc non Af Amer: >60 mL/min (ref >60)
GLUCOSE: 245 mg/dL — AB (ref 65–99)
POTASSIUM: 3.7 mmol/L (ref 3.5–5.1)
SODIUM: 136 mmol/L (ref 135–145)
TOTAL PROTEIN: 6.5 g/dL (ref 6.5–8.1)
Total Bilirubin: 0.6 mg/dL (ref 0.3–1.2)

## 2016-09-09 LAB — SURGICAL PCR SCREEN
MRSA, PCR: NEGATIVE
STAPHYLOCOCCUS AUREUS: NEGATIVE

## 2016-09-09 LAB — GLUCOSE, CAPILLARY: GLUCOSE-CAPILLARY: 239 mg/dL — AB (ref 65–99)

## 2016-09-09 NOTE — Progress Notes (Addendum)
PCP - Judithann Sheen Aderoju Cardiologist - working on getting a cardiologist wants one with [facility]   Chest x-ray - not needed EKG - 06/25/16 - requesting Stress Test - 06/25/16 ECHO - 06/20/16 Cardiac Cath - 2015 requesting  Sleep Study -  CPAP -   Fasting Blood Sugar - not checking at this time because her meter broke and she is working on getting a new one  Sending to anestehsia for review of heart history and recent admission at [facility]  Patient has already stopped Aspirin. She was instructed to stop it a week before surgery Patient is not taking plavix anymore   Patient denies shortness of breath, fever, cough and chest pain at PAT appointment   Patient verbalized understanding of instructions that were given to them at the PAT appointment. Patient was also instructed that they will need to review over the PAT instructions again at home before surgery.

## 2016-09-09 NOTE — Pre-Procedure Instructions (Signed)
[name] Bean  09/09/2016      [id number] , [place] - [address] AT [address]. [name] [address] Phone: [phone] Fax: [phone]  [id number] - [place], [place]. [name] [address] Phone: [phone] Fax: [phone]    Your procedure is scheduled on June 6  Report to [facility] at 0700 A.M.  Call this number if you have problems the morning of surgery:  [phone]   Remember:  Do not eat food or drink liquids after midnight.   Take these medicines the morning of surgery with A SIP OF WATER carvedilol (COREG), escitalopram (LEXAPRO) , hydrALAZINE (APRESOLINE),    Follow your doctors instructions regarding your Aspirin.  If no instructions were given by the doctor you will need to call the office to get instructions.  Your pre admission RN will also call for those instructions  7 days prior to surgery STOP taking any Aspirin, Aleve, Naproxen, Ibuprofen, Motrin, Advil, Goody's, BC's, all herbal medications, fish oil, and all vitamins  FOLLOW MD'S INSTRUCTIONS ABOUT clopidogrel (PLAVIX)   WHAT DO I DO ABOUT MY DIABETES MEDICATION?   [name] Do not take oral diabetes medicines (pills) the morning of surgery.  . THE NIGHT BEFORE SURGERY, take ____11_______ units of _insulin glargine (LANTUS)__________insulin.      . If your CBG is greater than 220 mg/dL, you may take  of your sliding scale (correction) dose of insulin. insulin aspart (NOVOLOG)    How to Manage Your Diabetes Before and After Surgery  Why is it important to control my blood sugar before and after surgery? . Improving blood sugar levels before and after surgery helps healing and can limit problems. . A way of improving blood sugar control is eating a healthy diet by: o  Eating less sugar and carbohydrates o  Increasing activity/exercise o   Talking with your doctor about reaching your blood sugar goals . High blood sugars (greater than 180 mg/dL) can raise your risk of infections and slow your recovery, so you will need to focus on controlling your diabetes during the weeks before surgery. . Make sure that the doctor who takes care of your diabetes knows about your planned surgery including the date and location.  How do I manage my blood sugar before surgery? . Check your blood sugar at least 4 times a day, starting 2 days before surgery, to make sure that the level is not too high or low. o Check your blood sugar the morning of your surgery when you wake up and every 2 hours until you get to the Short Stay unit. . If your blood sugar is less than 70 mg/dL, you will need to treat for low blood sugar: o Do not take insulin. o Treat a low blood sugar (less than 70 mg/dL) with  cup of clear juice (cranberry or apple), 4 glucose tablets, OR glucose gel. o Recheck blood sugar in 15 minutes after treatment (to make sure it is greater than 70 mg/dL). If your blood sugar is not greater than 70 mg/dL on recheck, call [phone] for further instructions. . Report your blood sugar to the short stay nurse when you get to Short Stay.  . If you are admitted to the hospital after surgery: o Your blood sugar will be checked by the staff and you will probably be given insulin after surgery (instead of  oral diabetes medicines) to make sure you have good blood sugar levels. o The goal for blood sugar control after surgery is 80-180 mg/dL.     Do not wear jewelry, make-up or nail polish.  Do not wear lotions, powders, or perfumes, or deoderant.  Do not shave 48 hours prior to surgery.    Do not bring valuables to the hospital.  [facility] is not responsible for any belongings or valuables.  Contacts, dentures or bridgework may not be worn into surgery.  Leave your suitcase in the car.  After surgery it may be brought to your room.  For  patients admitted to the hospital, discharge time will be determined by your treatment team.  Patients discharged the day of surgery will not be allowed to drive home.    Special instructions:   - Preparing For Surgery  Before surgery, you can play an important role. Because skin is not sterile, your skin needs to be as free of germs as possible. You can reduce the number of germs on your skin by washing with CHG (chlorahexidine gluconate) Soap before surgery.  CHG is an antiseptic cleaner which kills germs and bonds with the skin to continue killing germs even after washing.  Please do not use if you have an allergy to CHG or antibacterial soaps. If your skin becomes reddened/irritated stop using the CHG.  Do not shave (including legs and underarms) for at least 48 hours prior to first CHG shower. It is OK to shave your face.  Please follow these instructions carefully.   1. Shower the NIGHT BEFORE SURGERY and the MORNING OF SURGERY with CHG.   2. If you chose to wash your hair, wash your hair first as usual with your normal shampoo.  3. After you shampoo, rinse your hair and body thoroughly to remove the shampoo.  4. Use CHG as you would any other liquid soap. You can apply CHG directly to the skin and wash gently with a scrungie or a clean washcloth.   5. Apply the CHG Soap to your body ONLY FROM THE NECK DOWN.  Do not use on open wounds or open sores. Avoid contact with your eyes, ears, mouth and genitals (private parts). Wash genitals (private parts) with your normal soap.  6. Wash thoroughly, paying special attention to the area where your surgery will be performed.  7. Thoroughly rinse your body with warm water from the neck down.  8. DO NOT shower/wash with your normal soap after using and rinsing off the CHG Soap.  9. Pat yourself dry with a CLEAN TOWEL.   10. Wear CLEAN PAJAMAS   11. Place CLEAN SHEETS on your bed the night of your first shower and DO NOT SLEEP  WITH PETS.    Day of Surgery: Do not apply any deodorants/lotions. Please wear clean clothes to the hospital/surgery center.      Please read over the following fact sheets that you were given.

## 2016-09-10 ENCOUNTER — Encounter (HOSPITAL_COMMUNITY): Payer: Self-pay

## 2016-09-10 LAB — HEMOGLOBIN A1C
Hgb A1c MFr Bld: 8.1 % — ABNORMAL HIGH (ref 4.8–5.6)
Mean Plasma Glucose: 186 mg/dL

## 2016-09-10 NOTE — Progress Notes (Signed)
Anesthesia Chart Review:  Pt is a 66 year old female scheduled for C2-3, C3-4 posterior cervical laminectomy on 09/16/2016 with Ashok Pall, M.D.  - PCP is Jovita Kussmaul, MD at the [facility] (notes in care everywhere) - Cardiologist is Mervin Hack, MD (notes in care everywhere). Last office visit 07/07/16 after hospital discharge; was scheduled to f/u 08/03/16 but did not.  (Note pt has moved to [place] and is looking for PCP and cardiologist here)   PMH includes: CAD (PCI to RCA 2002, to LAD 2015), PEA cardiac arrest (06/20/16), HFpEF, HTN, DM, hyperlipidemia, anemia, asthma. Current smoker. BMI 34.5.  - Hospitalized 3/10-19/18 at [facility] for cardiac arrest, NSTEMI. Complicated by pneumonia, hypoxemic hypercarbic respiratory failure, acute kidney injury.  "On 3/10, she went to [facility] to rent a truck to move to [place], [name] for care for her cervical stenosis. She went up the stairs and became SOB. EMS was called and by the time they arrived, she was found on her stomach w/ pink/frothy sputum and agonal respirations. No pulse was found, so CPR was started for 2 rounds (no epi) and achieved ROSC. CXR showing diffuse opacities concerning for pulm edema. Suspected cause of her arrest hypoxemic in the setting of flash pulmonary edema vs undertreated chronic heart failure."    Medications include: ASA 81 mg, carvedilol, hydralazine, NovoLog, Lantus, losartan, potassium, rosuvastatin, spironolactone, torsemide  Preoperative labs reviewed.  HbA1c 8.5, glucose 245 - HbA1c was 7.2 on 04/21/16.  Pt has not been able to test blood glucose at home because her meter broke.   CT angio chest 06/20/16 (care everywhere):  1.Please note the study is limited for the evaluation of distal pulmonary emboli secondary to respiratory motion and body habitus. Within this limitation no filling defects are identified in the central, lobar, or segmental pulmonary arteries.  2.Heterogeneous  consolidative and groundglass opacities are seen in the the lungs bilaterally. These are nonspecific and may represent infection, aspiration, permeability edema or a combination of several of these entities.   EKG 06/22/16 (care everywhere- requested tracing from [facility]): Sinus tachycardia (108 bpm). Biatrial enlargement. Consider LVH. Nonspecific T wave abnormalities, lateral leads. Prolonged QT  Cardiac stress MRI 06/25/16 (care everywhere): - Regadenoson stress cardiac MRI with and without contrast was performed on a 1.5 T MR scanner to evaluate myocardial morphology, function, viability, ischemia in a patient with chronic diastolic heart failure, which presented with PEA arrest, pulmonary edema and hypertensive emergency.   The left ventricle is normal in cavity size and overall wall thickness. There is basal sigmoid septal hypertrophy. Global systolic function is normal. The LV ejection fraction is 65%. There is hypokinesia of the basal and mid inferior wall, and hypokinesia of the distal anterior wall.  The right ventricle is normal in cavity size, wall thickness, and systolic function.  The left atrium is mildly dilated, the right atrium is normal in size.  The aortic valve is trileaflet with no significant aortic valve stenosis or regurgitation. There is no significant valvular disease.   Delayed enhancement imaging demonstrates two areas with myocardial infarction: a. subendocardial (25% transmural extent) infarct in the mid and apical anterior wall (LAD territory). b. infarct in the basal to distal inferior wall (RCA territory), which is 75% transmural at the base, and subendocardial (25% transmural extent) in the mid and distal segments. There is residual viability in all territories, except the basal inferior segment.  Regadenoson stress perfusion imaging demonstrates no evidence of inducible myocardial ischemia.  No LV thrombus seen.  Echo  06/20/16:  1. Mild LV dysfunction, EF  47%. Mild LVH.  2. RV inadequately seen. 3. S/P PEA arrest. 4. Poor acoustic windows and poor sound transmission  Cardiac cath 02/02/14 (by notes in care everywhere): - patent RCA stent with moderate RCA disease, moderate LCx disease, and significant LAD disease (distal 70%).  D1 and D2 small and diffusely diseased; not amenable to PCI. S/p DES to LAD   Pt with PEA arrest thought to be due to HF 06/20/16.  Was supposed to f/u with cardiology 08/03/16 but did not.  I suspect this is because pt moved from [place] to [place] and has not yet established care providers here.  Her [facility] cardiologist suggested she see Dr. Glori Bickers. Pt will need cardiology evaluation prior to surgery.  I notified Nikki in Dr. Lacy Duverney office.   Willeen Cass, FNP-BC [facility]Anesthesiology Phone: [phone] 09/10/2016 12:49 PM

## 2016-10-15 ENCOUNTER — Ambulatory Visit (HOSPITAL_COMMUNITY)
Admission: RE | Admit: 2016-10-15 | Discharge: 2016-10-15 | Disposition: A | Discharge status: Home / Self Care | Payer: BC Managed Care – PPO | Source: Ambulatory Visit | Attending: Internal Medicine | Admitting: Internal Medicine

## 2016-10-15 VITALS — BP 110/54 | HR 87 | Wt 188.2 lb

## 2016-10-15 DIAGNOSIS — Z9114 Patient's other noncompliance with medication regimen: Secondary | ICD-10-CM | POA: Diagnosis not present

## 2016-10-15 DIAGNOSIS — Z955 Presence of coronary angioplasty implant and graft: Secondary | ICD-10-CM | POA: Insufficient documentation

## 2016-10-15 DIAGNOSIS — J45909 Unspecified asthma, uncomplicated: Secondary | ICD-10-CM | POA: Insufficient documentation

## 2016-10-15 DIAGNOSIS — Z794 Long term (current) use of insulin: Secondary | ICD-10-CM | POA: Insufficient documentation

## 2016-10-15 DIAGNOSIS — I2510 Atherosclerotic heart disease of native coronary artery without angina pectoris: Secondary | ICD-10-CM | POA: Diagnosis not present

## 2016-10-15 DIAGNOSIS — I110 Hypertensive heart disease with heart failure: Secondary | ICD-10-CM | POA: Diagnosis not present

## 2016-10-15 DIAGNOSIS — Z7982 Long term (current) use of aspirin: Secondary | ICD-10-CM | POA: Insufficient documentation

## 2016-10-15 DIAGNOSIS — Z8674 Personal history of sudden cardiac arrest: Secondary | ICD-10-CM | POA: Diagnosis not present

## 2016-10-15 DIAGNOSIS — F329 Major depressive disorder, single episode, unspecified: Secondary | ICD-10-CM | POA: Insufficient documentation

## 2016-10-15 DIAGNOSIS — F419 Anxiety disorder, unspecified: Secondary | ICD-10-CM | POA: Diagnosis not present

## 2016-10-15 DIAGNOSIS — M4802 Spinal stenosis, cervical region: Secondary | ICD-10-CM | POA: Diagnosis not present

## 2016-10-15 DIAGNOSIS — F17200 Nicotine dependence, unspecified, uncomplicated: Secondary | ICD-10-CM | POA: Diagnosis not present

## 2016-10-15 DIAGNOSIS — I5032 Chronic diastolic (congestive) heart failure: Secondary | ICD-10-CM | POA: Diagnosis not present

## 2016-10-15 DIAGNOSIS — G629 Polyneuropathy, unspecified: Secondary | ICD-10-CM | POA: Diagnosis not present

## 2016-10-15 DIAGNOSIS — E785 Hyperlipidemia, unspecified: Secondary | ICD-10-CM | POA: Insufficient documentation

## 2016-10-15 DIAGNOSIS — E119 Type 2 diabetes mellitus without complications: Secondary | ICD-10-CM | POA: Diagnosis not present

## 2016-10-15 DIAGNOSIS — F17210 Nicotine dependence, cigarettes, uncomplicated: Secondary | ICD-10-CM | POA: Insufficient documentation

## 2016-10-15 DIAGNOSIS — Z01810 Encounter for preprocedural cardiovascular examination: Secondary | ICD-10-CM

## 2016-10-15 MED ORDER — CARVEDILOL 25 MG PO TABS: 25.0000 mg | ORAL_TABLET | Freq: Two times a day (BID) | ORAL | 6 refills | Status: DC

## 2016-10-15 NOTE — Patient Instructions (Signed)
Stop Metoprolol  Start back on Carvedilol 25 mg Twice daily, [facility]  Your physician has recommended that you have a pulmonary function test. Pulmonary Function Tests are a group of tests that measure how well air moves in and out of your lungs.  Your physician recommends that you schedule a follow-up appointment in: 3-4 months

## 2016-10-15 NOTE — Progress Notes (Signed)
ADVANCED HF CLINIC CONSULT NOTE  Referring Physician: Sharren Bridge NP-C  Primary Care: Primary Cardiologist: Sharren Bridge   HPI: [name] Bean is a 66 year old with a h/o cardiac arrest 2018, CAD S/P PCI Stent  to RCA 2002 and distal LAD 6659, chronic diastolic heart failure, HTN, Hyperlipidemia, spinal stenosis, tobacco use and DMII referred to HF clinic by Sharren Bridge NP at [facility] for HF follow up.   Admitted March 2018 to [facility] after cardiac arrest (PEA) thought to be from pulmonary edema .Ran out of lasix 2 weeks prior to admit. Initially intubated. Had strep pneumo in sputum. Ruled out for PE. No evidenced of ischemia. Diuresed with IV lasix transition to 60 mg torsemide daily. Discharge weight 191 pounds. Torsemide was later cut back to 20 mg daily at the office visit 08/03/2016.   Today she presents as new inpatient for diastolic heart failure management. She moved back to [facility] March 2018 to be followed by her Neuro Surgeon (Dr Cyndy Freeze) . Overall feeling ok. Denies SOB/PNd/Orthopnea. No chest pain. Weight at home 182-187 pounds. Limited mobility due to LLE calf pain related to spinal stenosis.  Had barbeque yesterday. Ambulates with a cane. Taking all medications. Takes extra 20 mg torsemide for ankle edema. Smoking 1 pack of cigarettes every 4 days. Lives alone.    CMRI 06/2016 1. The left ventricle is normal in cavity size and overall wall thickness. There is basal sigmoid septal hypertrophy. Global systolic function is normal. The LV ejection fraction is 65%. There is hypokinesia of the basal and mid inferior wall, and hypokinesia of the distal anterior wall. 2. The right ventricle is normal in cavity size, wall thickness, and systolic function. 3. The left atrium is mildly dilated, the right atrium is normal in size. 4. The aortic valve is trileaflet with no significant aortic valve stenosis or regurgitation. There is no significant valvular disease.  5. Delayed enhancement  imaging demonstrates two areas with myocardial infarction: a. subendocardial (25% transmural extent) infarct in the mid and apical anterior wall (LAD territory). b. infarct in the basal to distal inferior wall (RCA territory), which is 75% transmural at the base, and subendocardial (25% transmural extent) in the mid and distal segments. There is residual viability in all territories, except the basal inferior segment. 6. Regadenoson stress perfusion imaging demonstrates no evidence of inducible myocardial ischemia. 7. No LV thrombus seen. Review of Systems: [y] = yes, [ ]  = no   General: Weight gain [ ] ; Weight loss [ ] ; Anorexia [ ] ; Fatigue [ ] ; Fever [ ] ; Chills [ ] ; Weakness [ ]   Cardiac: Chest pain/pressure [ ] ; Resting SOB [ ] ; Exertional SOB [ ] ; Orthopnea [ ] ; Pedal Edema [ ] ; Palpitations [ ] ; Syncope [ ] ; Presyncope [ ] ; Paroxysmal nocturnal dyspnea[ ]   Pulmonary: Cough [ ] ; Wheezing[ ] ; Hemoptysis[ ] ; Sputum [ ] ; Snoring [ ]   GI: Vomiting[ ] ; Dysphagia[ ] ; Melena[ ] ; Hematochezia [ ] ; Heartburn[ ] ; Abdominal pain [ ] ; Constipation [ ] ; Diarrhea [ ] ; BRBPR [ ]   GU: Hematuria[ ] ; Dysuria [ ] ; Nocturia[ ]   Vascular: Pain in legs with walking [Y ]; Pain in feet with lying flat [ ] ; Non-healing sores [ ] ; Stroke [ ] ; TIA [ ] ; Slurred speech [ ] ;  Neuro: Headaches[ ] ; Vertigo[ ] ; Seizures[ ] ; Paresthesias[ ] ;Blurred vision [ ] ; Diplopia [ ] ; Vision changes [ ]   Ortho/Skin: Arthritis [ ] ; Joint pain [ Y]; Muscle pain [ ] ; Joint swelling [ ] ; Back Pain [  Y]; Rash [ ]   Psych: Depression[Y ]; Anxiety[ ]   Heme: Bleeding problems [ ] ; Clotting disorders [ ] ; Anemia [ ]   Endocrine: Diabetes [Y ]; Thyroid dysfunction[ ]    Past Medical History:  Diagnosis Date  . Anemia   . Anxiety   . Asthma   . Broken arm    left arm  . Bronchitis   . Bursitis of left hip   . CAD (coronary artery disease)   . Candidiasis, vagina   . Cardiac arrest ([place]) 06/20/2016   at [facility]; ? due to flash pulm  edema vs undertreated chronic CHF  . Cerumen impaction   . Colon, diverticulosis   . Depression   . Diabetes mellitus   . Fatigue   . Gastroenteritis   . Hot flashes, menopausal   . Hyperlipidemia   . Knee pain, left   . Lumbar back pain   . Maxillary sinusitis    history  . Muscle tear    right gluteus  . Nausea   . Other B-complex deficiencies   . Otitis media, acute    left  . Peripheral neuropathy   . Spinal stenosis of lumbar region   . Tubulovillous adenoma of colon   . Unspecified essential hypertension     Current Outpatient Prescriptions  Medication Sig Dispense Refill  . aspirin EC 81 MG tablet Take 81 mg by mouth daily.     . BD PEN NEEDLE NANO U/F 32G X 4 MM MISC USE TO INJECT INSULIN 5 TIMES A DAY 100 each 5  . escitalopram (LEXAPRO) 20 MG tablet Take 20 mg by mouth daily.    . hydrALAZINE (APRESOLINE) 50 MG tablet Take 2 tabs in AM and 1 tab in PM    . insulin aspart (NOVOLOG) 100 UNIT/ML injection Inject 12 Units into the skin 3 (three) times daily before meals.    . insulin glargine (LANTUS) 100 UNIT/ML injection Inject 22 Units into the skin at bedtime.    [name] latanoprost (XALATAN) 0.005 % ophthalmic solution Place 1 drop into both eyes at bedtime.    [name] losartan (COZAAR) 100 MG tablet Take 100 mg by mouth daily.    . Melatonin 3 MG TABS Take 3 mg by mouth at bedtime as needed (sleep).    . metoprolol succinate (TOPROL-XL) 50 MG 24 hr tablet TAKE 1 TABLET (50 MG TOTAL) BY MOUTH DAILY. 90 tablet 0  . ONETOUCH DELICA LANCETS MISC Use four times daily as directed     . rosuvastatin (CRESTOR) 10 MG tablet Take 1 tablet (10 mg total) by mouth daily. 90 tablet 0  . spironolactone (ALDACTONE) 25 MG tablet Take 25 mg by mouth daily.    [name] torsemide (DEMADEX) 20 MG tablet Take 20 mg by mouth daily. Take an extra 1-2 tabs as needed for swelling    . carvedilol (COREG) 25 MG tablet Take 25 mg by mouth 2 (two) times daily with a meal.     No current facility-administered  medications for this encounter.     Allergies  Allergen Reactions  . Sitagliptin Other (See Comments)    Had pancreatitis while on Januvia       Social History   Social History  . Marital status: Single    Spouse name: N/A  . Number of children: N/A  . Years of education: N/A   Occupational History  . SR DEV OFFICER [employer]   Social History Main Topics  . Smoking status: Current  Every Day Smoker  . Smokeless tobacco: Never Used     Comment: smokes two a day  . Alcohol use Yes     Comment: rarely  . Drug use: Yes    Types: Marijuana     Comment: about two years ago  . Sexual activity: Not on file   Other Topics Concern  . Not on file   Social History Narrative  . No narrative on file      Family History  Problem Relation Age of Onset  . Pneumonia Father        deceased  . Alcohol abuse Father   . Hypertension Mother   . Diabetes Mother     Vitals:   10/15/16 1207  BP: (!) 110/54  Pulse: 87  SpO2: 98%  Weight: 188 lb 4 oz (85.4 kg)    PHYSICAL EXAM: General:  Well appearing. No respiratory difficulty HEENT: normal Neck: supple. no JVD. Carotids 2+ bilat; no bruits. No lymphadenopathy or thryomegaly appreciated. Cor: PMI nondisplaced. Regular rate & rhythm. No rubs, gallops or murmurs. Lungs: clear Abdomen: soft, nontender, nondistended. No hepatosplenomegaly. No bruits or masses. Good bowel sounds. Extremities: no cyanosis, clubbing, rash, edema Neuro: alert & oriented x 3, cranial nerves grossly intact. moves all 4 extremities w/o difficulty. Affect pleasant.  ECG: NSR 68 bpm.   ASSESSMENT & PLAN:  1. Chronic Diastolic Heart Failure- CMRI EF 60-65% Volume status stable. Continue torsemide 20 mg daily with extra 20 mg as needed.  Continue losartan, spiro, and carvedilol. We will reorder carvedilol today.  2. CAD- S/P Stent RCA 2002 and LAD 2015 -No chest pain.  On statin/ aspirin/  Bb -stress cMRI at [facility] 3/18 with EF 60-65% no  reversible ischemia 3. HTN- Stable.  4. Smokes- Discussed smoking cessation. 5. Spinal Stenosis/pre-op eval- Planning for cervical laminectomy October 23, 2016.  Moderate risk for surgery given she had cardiac arrest 06/2016. Check PFTs. 6. Recent PEA arrest - likely due to hypoxia in setting of volume overload   Follow up in 3-4 months.    Amy Clegg NP-C  Patient seen and examined with Darrick Grinder, NP. We discussed all aspects of the encounter. I agree with the assessment and plan as stated above.   Records from [place] reviewed extensively through [place].   66 y/o woman as above admitted to [facility] in 3/18 with PEA arrest in the setting of pulmonary edema after non-compliance with lasix. cMRI with normal EF and no stress-induced ischemia. Doing well. Active but limited by spinal stenosis.. Volume status much improved. Still smoking. No wheezing on exam.   Overall stable. Felt to be moderate risk for peri-op cardiopulmonary complications. Will check PFTs prior to surgery. Counseled on need to stop smoking. If volume status becomes problematic can consider CardioMems device.   Glori Bickers, MD  11:49 PM

## 2016-10-19 ENCOUNTER — Encounter (HOSPITAL_COMMUNITY): Payer: Self-pay

## 2016-10-19 ENCOUNTER — Ambulatory Visit (HOSPITAL_COMMUNITY)
Admission: RE | Admit: 2016-10-19 | Discharge: 2016-10-19 | Disposition: A | Discharge status: Home / Self Care | Payer: BC Managed Care – PPO | Source: Ambulatory Visit | Attending: Internal Medicine | Admitting: Internal Medicine

## 2016-10-19 ENCOUNTER — Encounter (HOSPITAL_COMMUNITY)
Admission: RE | Admit: 2016-10-19 | Discharge: 2016-10-19 | Disposition: A | Discharge status: Home / Self Care | Payer: BC Managed Care – PPO | Source: Ambulatory Visit | Attending: Neurosurgery | Admitting: Neurosurgery

## 2016-10-19 DIAGNOSIS — I252 Old myocardial infarction: Secondary | ICD-10-CM | POA: Diagnosis not present

## 2016-10-19 DIAGNOSIS — E119 Type 2 diabetes mellitus without complications: Secondary | ICD-10-CM | POA: Diagnosis not present

## 2016-10-19 DIAGNOSIS — I5032 Chronic diastolic (congestive) heart failure: Secondary | ICD-10-CM

## 2016-10-19 DIAGNOSIS — I2510 Atherosclerotic heart disease of native coronary artery without angina pectoris: Secondary | ICD-10-CM | POA: Diagnosis not present

## 2016-10-19 DIAGNOSIS — E785 Hyperlipidemia, unspecified: Secondary | ICD-10-CM | POA: Diagnosis not present

## 2016-10-19 DIAGNOSIS — I10 Essential (primary) hypertension: Secondary | ICD-10-CM | POA: Insufficient documentation

## 2016-10-19 DIAGNOSIS — Z79899 Other long term (current) drug therapy: Secondary | ICD-10-CM | POA: Diagnosis not present

## 2016-10-19 DIAGNOSIS — Z8674 Personal history of sudden cardiac arrest: Secondary | ICD-10-CM | POA: Insufficient documentation

## 2016-10-19 DIAGNOSIS — F17200 Nicotine dependence, unspecified, uncomplicated: Secondary | ICD-10-CM | POA: Diagnosis not present

## 2016-10-19 DIAGNOSIS — Z01818 Encounter for other preprocedural examination: Secondary | ICD-10-CM | POA: Insufficient documentation

## 2016-10-19 DIAGNOSIS — Z7982 Long term (current) use of aspirin: Secondary | ICD-10-CM | POA: Diagnosis not present

## 2016-10-19 DIAGNOSIS — Z955 Presence of coronary angioplasty implant and graft: Secondary | ICD-10-CM | POA: Diagnosis not present

## 2016-10-19 DIAGNOSIS — Z01812 Encounter for preprocedural laboratory examination: Secondary | ICD-10-CM | POA: Insufficient documentation

## 2016-10-19 DIAGNOSIS — Z794 Long term (current) use of insulin: Secondary | ICD-10-CM | POA: Diagnosis not present

## 2016-10-19 HISTORY — DX: Unspecified osteoarthritis, unspecified site: M19.90

## 2016-10-19 HISTORY — DX: Heart failure, unspecified: I50.9

## 2016-10-19 LAB — BASIC METABOLIC PANEL
Anion gap: 13 (ref 5–15)
BUN: 19 mg/dL (ref 6–20)
CALCIUM: 9.3 mg/dL (ref 8.9–10.3)
CO2: 25 mmol/L (ref 22–32)
CREATININE: 0.94 mg/dL (ref 0.44–1.00)
Chloride: 98 mmol/L — ABNORMAL LOW (ref 101–111)
GFR calc Af Amer: >60 mL/min (ref >60)
GLUCOSE: 186 mg/dL — AB (ref 65–99)
Potassium: 4.4 mmol/L (ref 3.5–5.1)
Sodium: 136 mmol/L (ref 135–145)

## 2016-10-19 LAB — CBC
HEMATOCRIT: 35.8 % — AB (ref 36.0–46.0)
Hemoglobin: 11.8 g/dL — ABNORMAL LOW (ref 12.0–15.0)
MCH: 29.9 pg (ref 26.0–34.0)
MCHC: 33 g/dL (ref 30.0–36.0)
MCV: 90.9 fL (ref 78.0–100.0)
PLATELETS: 218 10*3/uL (ref 150–400)
RBC: 3.94 MIL/uL (ref 3.87–5.11)
RDW: 12.6 % (ref 11.5–15.5)
WBC: 6.2 10*3/uL (ref 4.0–10.5)

## 2016-10-19 LAB — PULMONARY FUNCTION TEST
DL/VA % PRED: 113 %
DL/VA: 5.16 ml/min/mmHg/L
DLCO UNC % PRED: 64 %
DLCO UNC: 13.83 ml/min/mmHg
FEF 25-75 Pre: 2.28 L/sec
FEF2575-%Pred-Pre: 128 %
FEV1-%Pred-Pre: 85 %
FEV1-Pre: 1.56 L
FEV1FVC-%PRED-PRE: 110 %
FEV6-%PRED-PRE: 79 %
FEV6-PRE: 1.79 L
FEV6FVC-%PRED-PRE: 104 %
FVC-%Pred-Pre: 76 %
FVC-Pre: 1.79 L
PRE FEV1/FVC RATIO: 87 %
PRE FEV6/FVC RATIO: 100 %
RV % pred: 69 %
RV: 1.37 L
TLC % PRED: 68 %
TLC: 3.24 L

## 2016-10-19 LAB — SURGICAL PCR SCREEN
MRSA, PCR: NEGATIVE
Staphylococcus aureus: NEGATIVE

## 2016-10-19 LAB — GLUCOSE, CAPILLARY: GLUCOSE-CAPILLARY: 213 mg/dL — AB (ref 65–99)

## 2016-10-19 NOTE — Pre-Procedure Instructions (Signed)
[name] Bean  10/19/2016      [id number] - [place], [place] - [address] AT [address]. [name] [address] Phone: [phone] Fax: [phone]  [id number] - [place], [place]. [name] [address] Phone: [phone] Fax: [phone]    Your procedure is scheduled on Friday, July 13th   Report to [facility] Admitting at  7:00 AM             (posted surgery time 9:00 - 11:19 am)   Call this number if you have problems the morning of surgery:  [phone]   Remember:  Do not eat food or drink liquids after midnight Thursday.              4-5 days prior to surgery, STOP taking any vitamins, herbal supplements, anti-inflammatories   Take these medicines the morning of surgery with A SIP OF WATER : Coreg, Lexapro, Apresoline   Do not wear jewelry, make-up or nail polish.  Do not wear lotions, powders, or perfumes, or deoderant.  Do not shave 48 hours prior to surgery.     Do not bring valuables to the hospital.  [facility] is not responsible for any belongings or valuables.  Contacts, dentures or bridgework may not be worn into surgery.  Leave your suitcase in the car.  After surgery it may be brought to your room.  For patients admitted to the hospital, discharge time will be determined by your treatment team.  Patients discharged the day of surgery will not be allowed to drive home, and will need someone to stay with you for the first 24 hr.   Please read over the following fact sheets that you were given. Pain Booklet, MRSA Information and Surgical Site Infection Prevention             How to Manage Your Diabetes Before and After Surgery  Why is it important to control my blood sugar before and after surgery? . Improving blood sugar levels before and after surgery helps healing and can limit problems. . A way of  improving blood sugar control is eating a healthy diet by: o  Eating less sugar and carbohydrates o  Increasing activity/exercise o  Talking with your doctor about reaching your blood sugar goals . High blood sugars (greater than 180 mg/dL) can raise your risk of infections and slow your recovery, so you will need to focus on controlling your diabetes during the weeks before surgery. . Make sure that the doctor who takes care of your diabetes knows about your planned surgery including the date and location.  How do I manage my blood sugar before surgery? . Check your blood sugar at least 4 times a day, starting 2 days before surgery, to make sure that the level is not too high or low. [name]  o Check your blood sugar the morning of your surgery when you wake up and every 2 hours until you get to the Short Stay unit. . If your blood sugar is less than 70 mg/dL, you will need to treat for low blood sugar: o Do not take insulin. o Treat a low blood sugar (less than 70 mg/dL) with  cup of clear juice (cranberry or apple), 4 glucose tablets, OR glucose gel. o  o Recheck blood sugar in 15 minutes after treatment (to make sure it is greater than 70 mg/dL).  If your blood sugar is not greater than 70 mg/dL on recheck, call [phone] for further instructions. . Report your blood sugar to the short stay nurse when you get to Short Stay.  . If you are admitted to the hospital after surgery: o Your blood sugar will be checked by the staff and you will probably be given insulin after surgery (instead of oral diabetes medicines) to make sure you have good blood sugar levels. o The goal for blood sugar control after surgery is 80-180 mg/dL.      WHAT DO I DO ABOUT MY DIABETES MEDICATION?   [name] Do not take oral diabetes medicines (pills) the morning of surgery.  . THE NIGHT BEFORE SURGERY, take  11 units of _Lantus insulin.      . The day of surgery, do not take other diabetes injectables, including  Byetta (exenatide), Bydureon (exenatide ER), Victoza (liraglutide), or Trulicity (dulaglutide).  . If your CBG is greater than 220 mg/dL, you may take  of your sliding scale (correction) dose of insulin.  Other Instructions:          Patient Signature:  Date:   Nurse Signature:  Date:   Reviewed and Endorsed by [facility] Patient Education Committee, August 2015

## 2016-10-19 NOTE — Progress Notes (Signed)
Cardio is Dr. Missy Sabins.  LOV 10/2016. Notes inside chart.  She was suppose to have this surgery earlier, but had to have cardiac clearance first. Denies any cp, sob.  PCP is Dr Briscoe Burns

## 2016-10-20 LAB — HEMOGLOBIN A1C
Hgb A1c MFr Bld: 7.6 % — ABNORMAL HIGH (ref 4.8–5.6)
MEAN PLASMA GLUCOSE: 171 mg/dL

## 2016-10-20 NOTE — Progress Notes (Signed)
Anesthesia Chart Review:  Pt is a 66 year old female scheduled for C2-3, C3-4 posterior cervical laminectomy on 09/16/2016 with Ashok Pall, M.D.  - Surgery was originally scheduled for 09/16/16 but was postponed so pt could get established with cardiologist here in [place] after moving from [facility] (after PEA arrest in March).   - PCP is Jovita Kussmaul, MD at the [facility] (notes in care everywhere) - Cardiologist is Glori Bickers, MD. Arletha Pili for surgery at moderate risk at last office visit 10/15/16 with Darrick Grinder, NP   PMH includes: CAD (PCI to RCA 2002, DES to LAD 2015), PEA cardiac arrest (06/20/16), HFpEF, HTN, DM, hyperlipidemia, anemia, asthma. Current smoker. BMI 34.  - Hospitalized 3/10-19/18 at [facility] for cardiac arrest, NSTEMI. Complicated by pneumonia, hypoxemic hypercarbic respiratory failure, acute kidney injury.  "On 3/10, she went to [facility] to rent a truck to move to [place], [name] for care for her cervical stenosis. She went up the stairs and became SOB. EMS was called and by the time they arrived, she was found on her stomach w/ pink/frothy sputum and agonal respirations. No pulse was found, so CPR was started for 2 rounds (no epi) and achieved ROSC. CXR showing diffuse opacities concerning for pulm edema. Suspected cause of her arrest hypoxemic in the setting of flash pulmonary edema vs undertreated chronic heart failure."    Medications include: ASA 81 mg, carvedilol, hydralazine, NovoLog, Lantus, losartan, rosuvastatin, spironolactone, torsemide  BP (!) 125/57   Pulse 82   Temp 36.7 C (Oral)   Resp 20   Ht 5\' 2"  (1.575 m)   Wt 187 lb 13.3 oz (85.2 kg)   SpO2 100%   BMI 34.35 kg/m    Preoperative labs reviewed.  HbA1c 7.6, glucose 186  CT angio chest 06/20/16 (care everywhere):  1.Please note the study is limited for the evaluation of distal pulmonary emboli secondary to respiratory motion and body habitus. Within this limitation no  filling defects are identified in the central, lobar, or segmental pulmonary arteries.  2.Heterogeneous consolidative and groundglass opacities are seen in the the lungs bilaterally. These are nonspecific and may represent infection, aspiration, permeability edema or a combination of several of these entities.   EKG 10/15/16: NSR. R/L lead reversal seen. Will repeat DOS.   PFTs 10/19/16:  1. Mild Restriction -Interstitial 2. Mild Diffusion Defect  Cardiac stress MRI 06/25/16 (care everywhere): - Regadenoson stress cardiac MRI with and without contrast was performed on a 1.5 T MR scanner to evaluate myocardial morphology, function, viability, ischemia in a patient with chronic diastolic heart failure, which presented with PEA arrest, pulmonary edema and hypertensive emergency.   The left ventricle is normal in cavity size and overall wall thickness. There is basal sigmoid septal hypertrophy. Global systolic function is normal. The LV ejection fraction is 65%. There is hypokinesia of the basal and mid inferior wall, and hypokinesia of the distal anterior wall.  The right ventricle is normal in cavity size, wall thickness, and systolic function.  The left atrium is mildly dilated, the right atrium is normal in size.  The aortic valve is trileaflet with no significant aortic valve stenosis or regurgitation. There is no significant valvular disease.   Delayed enhancement imaging demonstrates two areas with myocardial infarction: a. subendocardial (25% transmural extent) infarct in the mid and apical anterior wall (LAD territory). b. infarct in the basal to distal inferior wall (RCA territory), which is 75% transmural at the base, and subendocardial (25% transmural extent) in the mid and  distal segments. There is residual viability in all territories, except the basal inferior segment.  Regadenoson stress perfusion imaging demonstrates no evidence of inducible myocardial ischemia.  No LV  thrombus seen.  Echo 06/20/16:  1. Mild LV dysfunction, EF 47%. Mild LVH.  2. RV inadequately seen. 3. S/P PEA arrest. 4. Poor acoustic windows and poor sound transmission  Cardiac cath 02/02/14 (by notes in care everywhere): - patent RCA stent with moderate RCA disease, moderate LCx disease, and significant LAD disease (distal 70%).  D1 and D2 small and diffusely diseased; not amenable to PCI. S/p DES to LAD  If no changes, I anticipate pt can proceed with surgery as scheduled.   Willeen Cass, FNP-BC [facility]/Anesthesiology Phone: [phone] 10/20/2016 12:22 PM

## 2016-10-23 ENCOUNTER — Encounter (HOSPITAL_COMMUNITY)
Admission: AD | Disposition: A | Discharge status: Home / Self Care | Payer: Self-pay | Source: Ambulatory Visit | Attending: Neurosurgery

## 2016-10-23 ENCOUNTER — Observation Stay (HOSPITAL_COMMUNITY)
Admission: AD | Admit: 2016-10-23 | Discharge: 2016-10-24 | DRG: 520 | Disposition: A | Discharge status: Home / Self Care | Payer: BC Managed Care – PPO | Source: Ambulatory Visit | Attending: Neurosurgery | Admitting: Neurosurgery

## 2016-10-23 ENCOUNTER — Encounter (HOSPITAL_COMMUNITY): Payer: Self-pay | Admitting: *Deleted

## 2016-10-23 ENCOUNTER — Ambulatory Visit (HOSPITAL_COMMUNITY): Payer: BC Managed Care – PPO

## 2016-10-23 ENCOUNTER — Ambulatory Visit (HOSPITAL_COMMUNITY): Payer: BC Managed Care – PPO | Admitting: Emergency Medicine

## 2016-10-23 ENCOUNTER — Ambulatory Visit (HOSPITAL_COMMUNITY): Payer: BC Managed Care – PPO | Admitting: Anesthesiology

## 2016-10-23 DIAGNOSIS — H269 Unspecified cataract: Secondary | ICD-10-CM | POA: Insufficient documentation

## 2016-10-23 DIAGNOSIS — F329 Major depressive disorder, single episode, unspecified: Secondary | ICD-10-CM | POA: Diagnosis not present

## 2016-10-23 DIAGNOSIS — Z820 Family history of epilepsy and other diseases of the nervous system: Secondary | ICD-10-CM | POA: Insufficient documentation

## 2016-10-23 DIAGNOSIS — Z419 Encounter for procedure for purposes other than remedying health state, unspecified: Secondary | ICD-10-CM

## 2016-10-23 DIAGNOSIS — Z981 Arthrodesis status: Secondary | ICD-10-CM | POA: Insufficient documentation

## 2016-10-23 DIAGNOSIS — Z79899 Other long term (current) drug therapy: Secondary | ICD-10-CM | POA: Diagnosis not present

## 2016-10-23 DIAGNOSIS — M160 Bilateral primary osteoarthritis of hip: Secondary | ICD-10-CM | POA: Diagnosis not present

## 2016-10-23 DIAGNOSIS — Z7982 Long term (current) use of aspirin: Secondary | ICD-10-CM | POA: Insufficient documentation

## 2016-10-23 DIAGNOSIS — H409 Unspecified glaucoma: Secondary | ICD-10-CM | POA: Diagnosis not present

## 2016-10-23 DIAGNOSIS — G629 Polyneuropathy, unspecified: Secondary | ICD-10-CM | POA: Diagnosis not present

## 2016-10-23 DIAGNOSIS — M4800 Spinal stenosis, site unspecified: Secondary | ICD-10-CM | POA: Diagnosis present

## 2016-10-23 DIAGNOSIS — I2510 Atherosclerotic heart disease of native coronary artery without angina pectoris: Secondary | ICD-10-CM | POA: Insufficient documentation

## 2016-10-23 DIAGNOSIS — M4802 Spinal stenosis, cervical region: Principal | ICD-10-CM | POA: Diagnosis present

## 2016-10-23 DIAGNOSIS — E785 Hyperlipidemia, unspecified: Secondary | ICD-10-CM | POA: Diagnosis not present

## 2016-10-23 DIAGNOSIS — E119 Type 2 diabetes mellitus without complications: Secondary | ICD-10-CM | POA: Diagnosis not present

## 2016-10-23 DIAGNOSIS — E669 Obesity, unspecified: Secondary | ICD-10-CM | POA: Diagnosis not present

## 2016-10-23 DIAGNOSIS — Z794 Long term (current) use of insulin: Secondary | ICD-10-CM | POA: Diagnosis not present

## 2016-10-23 DIAGNOSIS — E7800 Pure hypercholesterolemia, unspecified: Secondary | ICD-10-CM | POA: Insufficient documentation

## 2016-10-23 DIAGNOSIS — F17200 Nicotine dependence, unspecified, uncomplicated: Secondary | ICD-10-CM | POA: Insufficient documentation

## 2016-10-23 DIAGNOSIS — M25562 Pain in left knee: Secondary | ICD-10-CM | POA: Insufficient documentation

## 2016-10-23 DIAGNOSIS — M545 Low back pain: Secondary | ICD-10-CM | POA: Diagnosis not present

## 2016-10-23 DIAGNOSIS — Z6835 Body mass index (BMI) 35.0-35.9, adult: Secondary | ICD-10-CM | POA: Diagnosis not present

## 2016-10-23 DIAGNOSIS — I10 Essential (primary) hypertension: Secondary | ICD-10-CM | POA: Insufficient documentation

## 2016-10-23 HISTORY — PX: POSTERIOR CERVICAL LAMINECTOMY: SHX2248

## 2016-10-23 LAB — GLUCOSE, CAPILLARY
GLUCOSE-CAPILLARY: 267 mg/dL — AB (ref 65–99)
GLUCOSE-CAPILLARY: 289 mg/dL — AB (ref 65–99)
Glucose-Capillary: 242 mg/dL — ABNORMAL HIGH (ref 65–99)
Glucose-Capillary: 242 mg/dL — ABNORMAL HIGH (ref 65–99)
Glucose-Capillary: 269 mg/dL — ABNORMAL HIGH (ref 65–99)

## 2016-10-23 SURGERY — POSTERIOR CERVICAL LAMINECTOMY
Anesthesia: General | Site: Spine Cervical

## 2016-10-23 MED ORDER — PHENYLEPHRINE HCL 10 MG/ML IJ SOLN
INTRAVENOUS | Status: DC | PRN
  Administered 2016-10-23: 30 ug/min via INTRAVENOUS

## 2016-10-23 MED ORDER — CEFAZOLIN SODIUM-DEXTROSE 2-4 GM/100ML-% IV SOLN
2.0000 g | INTRAVENOUS | Status: AC
  Administered 2016-10-23: 2 g via INTRAVENOUS
  Filled 2016-10-23: qty 100

## 2016-10-23 MED ORDER — LIDOCAINE-EPINEPHRINE 0.5 %-1:200000 IJ SOLN
INTRAMUSCULAR | Status: AC
  Filled 2016-10-23: qty 1

## 2016-10-23 MED ORDER — BACITRACIN ZINC 500 UNIT/GM EX OINT
TOPICAL_OINTMENT | CUTANEOUS | Status: DC | PRN
  Administered 2016-10-23: 1 via TOPICAL

## 2016-10-23 MED ORDER — ONDANSETRON HCL 4 MG PO TABS: 4.0000 mg | ORAL_TABLET | Freq: Four times a day (QID) | ORAL | Status: DC | PRN

## 2016-10-23 MED ORDER — FENTANYL CITRATE (PF) 100 MCG/2ML IJ SOLN
INTRAMUSCULAR | Status: DC | PRN
  Administered 2016-10-23: 100 ug via INTRAVENOUS
  Administered 2016-10-23: 50 ug via INTRAVENOUS

## 2016-10-23 MED ORDER — SODIUM CHLORIDE 0.9% FLUSH: 3.0000 mL | INTRAVENOUS | Status: DC | PRN

## 2016-10-23 MED ORDER — LIDOCAINE-EPINEPHRINE 0.5 %-1:200000 IJ SOLN
INTRAMUSCULAR | Status: DC | PRN
  Administered 2016-10-23: 10 mL

## 2016-10-23 MED ORDER — SODIUM CHLORIDE 0.9 % IV SOLN
250.0000 mL | INTRAVENOUS | Status: DC
  Administered 2016-10-23: 250 mL via INTRAVENOUS

## 2016-10-23 MED ORDER — BACITRACIN ZINC 500 UNIT/GM EX OINT
TOPICAL_OINTMENT | CUTANEOUS | Status: AC
  Filled 2016-10-23: qty 28.35

## 2016-10-23 MED ORDER — TIZANIDINE HCL 4 MG PO TABS: 4.0000 mg | ORAL_TABLET | Freq: Four times a day (QID) | ORAL | 0 refills | Status: DC | PRN

## 2016-10-23 MED ORDER — PHENOL 1.4 % MT LIQD: 1.0000 | OROMUCOSAL | Status: DC | PRN

## 2016-10-23 MED ORDER — PHENYLEPHRINE HCL 10 MG/ML IJ SOLN
INTRAMUSCULAR | Status: AC
  Filled 2016-10-23: qty 1

## 2016-10-23 MED ORDER — MORPHINE SULFATE (PF) 2 MG/ML IV SOLN: 2.0000 mg | INTRAVENOUS | Status: DC | PRN

## 2016-10-23 MED ORDER — ONDANSETRON HCL 4 MG/2ML IJ SOLN
INTRAMUSCULAR | Status: DC | PRN
  Administered 2016-10-23: 4 mg via INTRAVENOUS

## 2016-10-23 MED ORDER — PROMETHAZINE HCL 25 MG/ML IJ SOLN: 6.2500 mg | INTRAMUSCULAR | Status: DC | PRN

## 2016-10-23 MED ORDER — PHENYLEPHRINE 40 MCG/ML (10ML) SYRINGE FOR IV PUSH (FOR BLOOD PRESSURE SUPPORT)
PREFILLED_SYRINGE | INTRAVENOUS | Status: AC
  Filled 2016-10-23: qty 10

## 2016-10-23 MED ORDER — TORSEMIDE 20 MG PO TABS: 20.0000 mg | ORAL_TABLET | Freq: Every day | ORAL | Status: DC | PRN

## 2016-10-23 MED ORDER — ESCITALOPRAM OXALATE 10 MG PO TABS
20.0000 mg | ORAL_TABLET | Freq: Every day | ORAL | Status: DC
  Administered 2016-10-24: 20 mg via ORAL
  Filled 2016-10-23: qty 2

## 2016-10-23 MED ORDER — KETOROLAC TROMETHAMINE 15 MG/ML IJ SOLN
7.5000 mg | Freq: Four times a day (QID) | INTRAMUSCULAR | Status: DC
  Administered 2016-10-23 – 2016-10-24 (×3): 7.5 mg via INTRAVENOUS
  Filled 2016-10-23 (×3): qty 1

## 2016-10-23 MED ORDER — OXYCODONE HCL 5 MG PO TABS
5.0000 mg | ORAL_TABLET | ORAL | Status: DC | PRN
  Administered 2016-10-23 – 2016-10-24 (×3): 10 mg via ORAL
  Administered 2016-10-24: 5 mg via ORAL
  Filled 2016-10-23: qty 1
  Filled 2016-10-23 (×3): qty 2

## 2016-10-23 MED ORDER — TORSEMIDE 20 MG PO TABS
20.0000 mg | ORAL_TABLET | Freq: Every day | ORAL | Status: DC
  Administered 2016-10-24: 20 mg via ORAL
  Filled 2016-10-23: qty 1

## 2016-10-23 MED ORDER — LOSARTAN POTASSIUM 50 MG PO TABS
100.0000 mg | ORAL_TABLET | Freq: Every day | ORAL | Status: DC
  Administered 2016-10-24: 100 mg via ORAL
  Filled 2016-10-23: qty 2

## 2016-10-23 MED ORDER — TORSEMIDE 20 MG PO TABS: 20.0000 mg | ORAL_TABLET | ORAL | Status: DC

## 2016-10-23 MED ORDER — LIDOCAINE HCL (CARDIAC) 20 MG/ML IV SOLN
INTRAVENOUS | Status: AC
  Filled 2016-10-23: qty 5

## 2016-10-23 MED ORDER — MENTHOL 3 MG MT LOZG: 1.0000 | LOZENGE | OROMUCOSAL | Status: DC | PRN

## 2016-10-23 MED ORDER — BUPIVACAINE HCL (PF) 0.5 % IJ SOLN
INTRAMUSCULAR | Status: AC
  Filled 2016-10-23: qty 30

## 2016-10-23 MED ORDER — CELECOXIB 200 MG PO CAPS
200.0000 mg | ORAL_CAPSULE | Freq: Two times a day (BID) | ORAL | Status: DC
  Administered 2016-10-23 – 2016-10-24 (×2): 200 mg via ORAL
  Filled 2016-10-23 (×2): qty 1

## 2016-10-23 MED ORDER — EPHEDRINE SULFATE 50 MG/ML IJ SOLN
INTRAMUSCULAR | Status: DC | PRN
  Administered 2016-10-23: 5 mg via INTRAVENOUS

## 2016-10-23 MED ORDER — CHLORHEXIDINE GLUCONATE CLOTH 2 % EX PADS: 6.0000 | MEDICATED_PAD | Freq: Once | CUTANEOUS | Status: DC

## 2016-10-23 MED ORDER — DIAZEPAM 5 MG PO TABS
5.0000 mg | ORAL_TABLET | Freq: Four times a day (QID) | ORAL | Status: DC | PRN
  Administered 2016-10-24: 5 mg via ORAL
  Filled 2016-10-23 (×2): qty 1

## 2016-10-23 MED ORDER — MEPERIDINE HCL 25 MG/ML IJ SOLN: 6.2500 mg | INTRAMUSCULAR | Status: DC | PRN

## 2016-10-23 MED ORDER — SUGAMMADEX SODIUM 200 MG/2ML IV SOLN
INTRAVENOUS | Status: DC | PRN
  Administered 2016-10-23: 175 mg via INTRAVENOUS

## 2016-10-23 MED ORDER — FENTANYL CITRATE (PF) 250 MCG/5ML IJ SOLN
INTRAMUSCULAR | Status: AC
  Filled 2016-10-23: qty 5

## 2016-10-23 MED ORDER — ROSUVASTATIN CALCIUM 5 MG PO TABS
20.0000 mg | ORAL_TABLET | Freq: Every day | ORAL | Status: DC
  Administered 2016-10-24: 20 mg via ORAL
  Filled 2016-10-23: qty 4

## 2016-10-23 MED ORDER — ZOLPIDEM TARTRATE 5 MG PO TABS: 5.0000 mg | ORAL_TABLET | Freq: Every evening | ORAL | Status: DC | PRN

## 2016-10-23 MED ORDER — SODIUM CHLORIDE 0.9% FLUSH
3.0000 mL | Freq: Two times a day (BID) | INTRAVENOUS | Status: DC
  Administered 2016-10-24: 3 mL via INTRAVENOUS

## 2016-10-23 MED ORDER — SUGAMMADEX SODIUM 500 MG/5ML IV SOLN
INTRAVENOUS | Status: AC
  Filled 2016-10-23: qty 5

## 2016-10-23 MED ORDER — INSULIN ASPART 100 UNIT/ML ~~LOC~~ SOLN
12.0000 [IU] | Freq: Three times a day (TID) | SUBCUTANEOUS | Status: DC
  Administered 2016-10-23 – 2016-10-24 (×2): 12 [IU] via SUBCUTANEOUS

## 2016-10-23 MED ORDER — DEXAMETHASONE SODIUM PHOSPHATE 10 MG/ML IJ SOLN
INTRAMUSCULAR | Status: DC | PRN
  Administered 2016-10-23: 10 mg via INTRAVENOUS

## 2016-10-23 MED ORDER — PROPOFOL 10 MG/ML IV BOLUS
INTRAVENOUS | Status: DC | PRN
  Administered 2016-10-23: 50 mg via INTRAVENOUS
  Administered 2016-10-23: 150 mg via INTRAVENOUS

## 2016-10-23 MED ORDER — LIDOCAINE HCL (CARDIAC) 20 MG/ML IV SOLN
INTRAVENOUS | Status: DC | PRN
  Administered 2016-10-23: 60 mg via INTRAVENOUS

## 2016-10-23 MED ORDER — ONDANSETRON HCL 4 MG/2ML IJ SOLN: 4.0000 mg | Freq: Four times a day (QID) | INTRAMUSCULAR | Status: DC | PRN

## 2016-10-23 MED ORDER — THROMBIN 5000 UNITS EX SOLR
CUTANEOUS | Status: DC | PRN
  Administered 2016-10-23 (×2): 5000 [IU] via TOPICAL

## 2016-10-23 MED ORDER — DEXAMETHASONE SODIUM PHOSPHATE 10 MG/ML IJ SOLN
INTRAMUSCULAR | Status: AC
  Filled 2016-10-23: qty 1

## 2016-10-23 MED ORDER — HYDRALAZINE HCL 50 MG PO TABS
50.0000 mg | ORAL_TABLET | Freq: Every day | ORAL | Status: DC
  Administered 2016-10-23: 50 mg via ORAL
  Filled 2016-10-23: qty 1

## 2016-10-23 MED ORDER — HYDROMORPHONE HCL 1 MG/ML IJ SOLN
INTRAMUSCULAR | Status: AC
  Filled 2016-10-23: qty 1

## 2016-10-23 MED ORDER — LATANOPROST 0.005 % OP SOLN
1.0000 [drp] | Freq: Every day | OPHTHALMIC | Status: DC
  Administered 2016-10-23: 1 [drp] via OPHTHALMIC
  Filled 2016-10-23: qty 2.5

## 2016-10-23 MED ORDER — HYDROCODONE-ACETAMINOPHEN 5-325 MG PO TABS: 1.0000 | ORAL_TABLET | Freq: Four times a day (QID) | ORAL | 0 refills | Status: DC | PRN

## 2016-10-23 MED ORDER — PHENYLEPHRINE HCL 10 MG/ML IJ SOLN
INTRAMUSCULAR | Status: DC | PRN
  Administered 2016-10-23 (×2): 80 ug via INTRAVENOUS

## 2016-10-23 MED ORDER — HYDROMORPHONE HCL 1 MG/ML IJ SOLN
INTRAMUSCULAR | Status: AC
  Filled 2016-10-23: qty 0.5

## 2016-10-23 MED ORDER — HYDRALAZINE HCL 50 MG PO TABS: 50.0000 mg | ORAL_TABLET | ORAL | Status: DC

## 2016-10-23 MED ORDER — KETOROLAC TROMETHAMINE 30 MG/ML IJ SOLN
INTRAMUSCULAR | Status: AC
  Filled 2016-10-23: qty 1

## 2016-10-23 MED ORDER — ROCURONIUM BROMIDE 100 MG/10ML IV SOLN
INTRAVENOUS | Status: DC | PRN
  Administered 2016-10-23: 40 mg via INTRAVENOUS
  Administered 2016-10-23: 10 mg via INTRAVENOUS

## 2016-10-23 MED ORDER — MELATONIN 3 MG PO TABS
3.0000 mg | ORAL_TABLET | Freq: Every evening | ORAL | Status: DC | PRN
Start: 2016-10-23 — End: 2016-10-24
  Filled 2016-10-23: qty 1

## 2016-10-23 MED ORDER — POTASSIUM CHLORIDE IN NACL 20-0.9 MEQ/L-% IV SOLN
INTRAVENOUS | Status: DC
  Administered 2016-10-23 – 2016-10-24 (×2): via INTRAVENOUS
  Filled 2016-10-23 (×2): qty 1000

## 2016-10-23 MED ORDER — ACETAMINOPHEN 325 MG PO TABS: 650.0000 mg | ORAL_TABLET | ORAL | Status: DC | PRN

## 2016-10-23 MED ORDER — INSULIN ASPART 100 UNIT/ML ~~LOC~~ SOLN
0.0000 [IU] | Freq: Three times a day (TID) | SUBCUTANEOUS | Status: DC
  Administered 2016-10-24: 5 [IU] via SUBCUTANEOUS

## 2016-10-23 MED ORDER — HEMOSTATIC AGENTS (NO CHARGE) OPTIME
TOPICAL | Status: DC | PRN
  Administered 2016-10-23: 1 via TOPICAL

## 2016-10-23 MED ORDER — ACETAMINOPHEN 650 MG RE SUPP: 650.0000 mg | RECTAL | Status: DC | PRN

## 2016-10-23 MED ORDER — MIDAZOLAM HCL 5 MG/5ML IJ SOLN
INTRAMUSCULAR | Status: DC | PRN
  Administered 2016-10-23: 2 mg via INTRAVENOUS

## 2016-10-23 MED ORDER — LACTATED RINGERS IV SOLN
INTRAVENOUS | Status: DC
  Administered 2016-10-23 (×2): via INTRAVENOUS

## 2016-10-23 MED ORDER — EPHEDRINE 5 MG/ML INJ
INTRAVENOUS | Status: AC
  Filled 2016-10-23: qty 10

## 2016-10-23 MED ORDER — ASPIRIN EC 81 MG PO TBEC
81.0000 mg | DELAYED_RELEASE_TABLET | Freq: Every day | ORAL | Status: DC
  Administered 2016-10-24: 81 mg via ORAL
  Filled 2016-10-23: qty 1

## 2016-10-23 MED ORDER — BUPIVACAINE HCL (PF) 0.5 % IJ SOLN
INTRAMUSCULAR | Status: DC | PRN
  Administered 2016-10-23: 30 mL

## 2016-10-23 MED ORDER — THROMBIN 5000 UNITS EX SOLR
CUTANEOUS | Status: AC
  Filled 2016-10-23: qty 10000

## 2016-10-23 MED ORDER — ONDANSETRON HCL 4 MG/2ML IJ SOLN
INTRAMUSCULAR | Status: AC
  Filled 2016-10-23: qty 2

## 2016-10-23 MED ORDER — CARVEDILOL 25 MG PO TABS
25.0000 mg | ORAL_TABLET | Freq: Two times a day (BID) | ORAL | Status: DC
Start: 2016-10-23 — End: 2016-10-24
  Administered 2016-10-23 – 2016-10-24 (×2): 25 mg via ORAL
  Filled 2016-10-23 (×2): qty 1

## 2016-10-23 MED ORDER — SPIRONOLACTONE 25 MG PO TABS
25.0000 mg | ORAL_TABLET | Freq: Every day | ORAL | Status: DC
  Administered 2016-10-24: 25 mg via ORAL
  Filled 2016-10-23: qty 1

## 2016-10-23 MED ORDER — ROCURONIUM BROMIDE 50 MG/5ML IV SOLN
INTRAVENOUS | Status: AC
  Filled 2016-10-23: qty 2

## 2016-10-23 MED ORDER — KETOROLAC TROMETHAMINE 30 MG/ML IJ SOLN
30.0000 mg | Freq: Once | INTRAMUSCULAR | Status: DC | PRN
  Administered 2016-10-23: 30 mg via INTRAVENOUS

## 2016-10-23 MED ORDER — MIDAZOLAM HCL 2 MG/2ML IJ SOLN
INTRAMUSCULAR | Status: AC
  Filled 2016-10-23: qty 2

## 2016-10-23 MED ORDER — INSULIN GLARGINE 100 UNIT/ML ~~LOC~~ SOLN
22.0000 [IU] | Freq: Every day | SUBCUTANEOUS | Status: DC
  Administered 2016-10-23: 22 [IU] via SUBCUTANEOUS
  Filled 2016-10-23: qty 0.22

## 2016-10-23 MED ORDER — PROPOFOL 10 MG/ML IV BOLUS
INTRAVENOUS | Status: AC
  Filled 2016-10-23: qty 20

## 2016-10-23 MED ORDER — 0.9 % SODIUM CHLORIDE (POUR BTL) OPTIME
TOPICAL | Status: DC | PRN
  Administered 2016-10-23: 1000 mL

## 2016-10-23 MED ORDER — HYDRALAZINE HCL 50 MG PO TABS
100.0000 mg | ORAL_TABLET | Freq: Every morning | ORAL | Status: DC
  Administered 2016-10-24: 100 mg via ORAL
  Filled 2016-10-23: qty 2

## 2016-10-23 MED ORDER — VARENICLINE TARTRATE 1 MG PO TABS
1.0000 mg | ORAL_TABLET | Freq: Two times a day (BID) | ORAL | Status: DC
Start: 2016-10-23 — End: 2016-10-24
  Administered 2016-10-23 – 2016-10-24 (×2): 1 mg via ORAL
  Filled 2016-10-23 (×3): qty 1

## 2016-10-23 MED ORDER — HYDROMORPHONE HCL 1 MG/ML IJ SOLN
0.2500 mg | INTRAMUSCULAR | Status: DC | PRN
  Administered 2016-10-23 (×3): 0.5 mg via INTRAVENOUS

## 2016-10-23 SURGICAL SUPPLY — 77 items
BAG DECANTER FOR FLEXI CONT (MISCELLANEOUS) IMPLANT
BENZOIN TINCTURE PRP APPL 2/3 (GAUZE/BANDAGES/DRESSINGS) IMPLANT
BIT DRILL NEURO 2X3.1 SFT TUCH (MISCELLANEOUS) IMPLANT
BLADE CLIPPER SURG (BLADE) ×3 IMPLANT
BLADE ULTRA TIP 2M (BLADE) IMPLANT
BUR MATCHSTICK NEURO 3.0 LAGG (BURR) ×3 IMPLANT
BUR ROUND FLUTED 5 RND (BURR) ×2 IMPLANT
BUR ROUND FLUTED 5MM RND (BURR) ×1
CANISTER SUCT 3000ML PPV (MISCELLANEOUS) ×3 IMPLANT
CARTRIDGE OIL MAESTRO DRILL (MISCELLANEOUS) ×1 IMPLANT
CLOSURE WOUND 1/2 X4 (GAUZE/BANDAGES/DRESSINGS)
DECANTER SPIKE VIAL GLASS SM (MISCELLANEOUS) ×3 IMPLANT
DERMABOND ADVANCED (GAUZE/BANDAGES/DRESSINGS) ×2
DERMABOND ADVANCED .7 DNX12 (GAUZE/BANDAGES/DRESSINGS) ×1 IMPLANT
DIFFUSER DRILL AIR PNEUMATIC (MISCELLANEOUS) ×3 IMPLANT
DRAPE LAPAROTOMY 100X72 PEDS (DRAPES) ×3 IMPLANT
DRAPE MICROSCOPE LEICA (MISCELLANEOUS) IMPLANT
DRAPE POUCH INSTRU U-SHP 10X18 (DRAPES) ×3 IMPLANT
DRILL NEURO 2X3.1 SOFT TOUCH (MISCELLANEOUS)
DRSG OPSITE POSTOP 4X6 (GAUZE/BANDAGES/DRESSINGS) ×3 IMPLANT
DURAPREP 6ML APPLICATOR 50/CS (WOUND CARE) ×9 IMPLANT
ELECT REM PT RETURN 9FT ADLT (ELECTROSURGICAL) ×3
ELECTRODE REM PT RTRN 9FT ADLT (ELECTROSURGICAL) ×1 IMPLANT
GAUZE SPONGE 4X4 16PLY XRAY LF (GAUZE/BANDAGES/DRESSINGS) IMPLANT
GLOVE BIO SURGEON STRL SZ 6.5 (GLOVE) IMPLANT
GLOVE BIO SURGEON STRL SZ7 (GLOVE) IMPLANT
GLOVE BIO SURGEON STRL SZ7.5 (GLOVE) IMPLANT
GLOVE BIO SURGEON STRL SZ8 (GLOVE) IMPLANT
GLOVE BIO SURGEON STRL SZ8.5 (GLOVE) IMPLANT
GLOVE BIO SURGEONS STRL SZ 6.5 (GLOVE)
GLOVE BIOGEL M 8.0 STRL (GLOVE) IMPLANT
GLOVE BIOGEL PI IND STRL 7.0 (GLOVE) ×2 IMPLANT
GLOVE BIOGEL PI IND STRL 7.5 (GLOVE) ×3 IMPLANT
GLOVE BIOGEL PI INDICATOR 7.0 (GLOVE) ×4
GLOVE BIOGEL PI INDICATOR 7.5 (GLOVE) ×6
GLOVE ECLIPSE 6.5 STRL STRAW (GLOVE) ×3 IMPLANT
GLOVE ECLIPSE 7.0 STRL STRAW (GLOVE) ×9 IMPLANT
GLOVE ECLIPSE 7.5 STRL STRAW (GLOVE) ×3 IMPLANT
GLOVE ECLIPSE 8.0 STRL XLNG CF (GLOVE) IMPLANT
GLOVE ECLIPSE 8.5 STRL (GLOVE) IMPLANT
GLOVE EXAM NITRILE LRG STRL (GLOVE) IMPLANT
GLOVE EXAM NITRILE XL STR (GLOVE) IMPLANT
GLOVE EXAM NITRILE XS STR PU (GLOVE) IMPLANT
GLOVE INDICATOR 6.5 STRL GRN (GLOVE) IMPLANT
GLOVE INDICATOR 7.0 STRL GRN (GLOVE) IMPLANT
GLOVE INDICATOR 7.5 STRL GRN (GLOVE) IMPLANT
GLOVE INDICATOR 8.0 STRL GRN (GLOVE) IMPLANT
GLOVE INDICATOR 8.5 STRL (GLOVE) IMPLANT
GLOVE OPTIFIT SS 8.0 STRL (GLOVE) IMPLANT
GLOVE SURG SS PI 6.5 STRL IVOR (GLOVE) IMPLANT
GOWN STRL REUS W/ TWL LRG LVL3 (GOWN DISPOSABLE) ×2 IMPLANT
GOWN STRL REUS W/ TWL XL LVL3 (GOWN DISPOSABLE) ×1 IMPLANT
GOWN STRL REUS W/TWL 2XL LVL3 (GOWN DISPOSABLE) ×3 IMPLANT
GOWN STRL REUS W/TWL LRG LVL3 (GOWN DISPOSABLE) ×6
GOWN STRL REUS W/TWL XL LVL3 (GOWN DISPOSABLE) ×3
KIT BASIN OR (CUSTOM PROCEDURE TRAY) ×3 IMPLANT
KIT ROOM TURNOVER OR (KITS) ×3 IMPLANT
MARKER SKIN DUAL TIP RULER LAB (MISCELLANEOUS) ×3 IMPLANT
NEEDLE HYPO 25X1 1.5 SAFETY (NEEDLE) ×6 IMPLANT
NS IRRIG 1000ML POUR BTL (IV SOLUTION) ×3 IMPLANT
OIL CARTRIDGE MAESTRO DRILL (MISCELLANEOUS) ×3
PACK LAMINECTOMY NEURO (CUSTOM PROCEDURE TRAY) ×3 IMPLANT
PATTIES SURGICAL .25X.25 (GAUZE/BANDAGES/DRESSINGS) IMPLANT
RUBBERBAND STERILE (MISCELLANEOUS) IMPLANT
SPONGE SURGIFOAM ABS GEL SZ50 (HEMOSTASIS) ×3 IMPLANT
STAPLER SKIN PROX WIDE 3.9 (STAPLE) ×3 IMPLANT
STRIP CLOSURE SKIN 1/2X4 (GAUZE/BANDAGES/DRESSINGS) IMPLANT
SUT ETHILON 3 0 FSL (SUTURE) IMPLANT
SUT VIC AB 0 CT1 18XCR BRD8 (SUTURE) ×1 IMPLANT
SUT VIC AB 0 CT1 8-18 (SUTURE) ×2
SUT VIC AB 2-0 CT1 18 (SUTURE) ×3 IMPLANT
SUT VIC AB 3-0 SH 8-18 (SUTURE) ×3 IMPLANT
SYR CONTROL 10ML LL (SYRINGE) ×6 IMPLANT
TOWEL GREEN STERILE (TOWEL DISPOSABLE) ×3 IMPLANT
TOWEL GREEN STERILE FF (TOWEL DISPOSABLE) ×3 IMPLANT
UNDERPAD 30X30 (UNDERPADS AND DIAPERS) IMPLANT
WATER STERILE IRR 1000ML POUR (IV SOLUTION) ×3 IMPLANT

## 2016-10-23 NOTE — Transfer of Care (Signed)
Immediate Anesthesia Transfer of Care Note  Patient: [name] Bean  Procedure(s) Performed: Procedure(s) with comments: POSTERIOR CERVICAL LAMINECTOMY MULTI LEVEL CERVICAL TWO- CERVICAL THREE, CERVICAL THREE- CERVICAL FOUR (N/A) - POSTERIOR  Patient Location: PACU  Anesthesia Type:General  Level of Consciousness: awake, alert , oriented and sedated  Airway & Oxygen Therapy: Patient Spontanous Breathing and Patient connected to nasal cannula oxygen  Post-op Assessment: Report given to RN, Post -op Vital signs reviewed and stable and Patient moving all extremities  Post vital signs: Reviewed and stable  Last Vitals:  Vitals:   10/23/16 0755 10/23/16 1145  BP: (!) 98/51   Pulse: 80 71  Resp: 20 18  Temp: 36.7 C     Last Pain:  Vitals:   10/23/16 0755  TempSrc: Oral         Complications: No apparent anesthesia complications

## 2016-10-23 NOTE — Anesthesia Preprocedure Evaluation (Signed)
Anesthesia Evaluation  Patient identified by MRN, date of birth, ID band Patient awake    Reviewed: Allergy & Precautions, NPO status , Patient's Chart, lab work & pertinent test results  Airway Mallampati: III       Dental  (+) Poor Dentition, Missing,    Pulmonary Current Smoker,    Pulmonary exam normal breath sounds clear to auscultation       Cardiovascular hypertension, Pt. on medications and Pt. on home beta blockers Normal cardiovascular exam Rhythm:Regular Rate:Normal     Neuro/Psych    GI/Hepatic   Endo/Other  diabetes, Insulin Dependent, Oral Hypoglycemic Agents  Renal/GU      Musculoskeletal   Abdominal (+) + obese,   Peds  Hematology   Anesthesia Other Findings   Reproductive/Obstetrics                             Anesthesia Physical Anesthesia Plan  ASA: II  Anesthesia Plan: General   Post-op Pain Management:    Induction: Intravenous  PONV Risk Score and Plan: 3 and Ondansetron, Dexamethasone, Propofol and Midazolam  Airway Management Planned: Oral ETT  Additional Equipment:   Intra-op Plan:   Post-operative Plan: Extubation in OR  Informed Consent: I have reviewed the patients History and Physical, chart, labs and discussed the procedure including the risks, benefits and alternatives for the proposed anesthesia with the patient or authorized representative who has indicated his/her understanding and acceptance.   Dental advisory given  Plan Discussed with: CRNA and Surgeon  Anesthesia Plan Comments:         Anesthesia Quick Evaluation

## 2016-10-23 NOTE — Progress Notes (Signed)
Pt received from PACU with no noted distress. Pt stable, neuro intact. Surgical honeycomb dressing site with old drainage, dry and intact. Pt oriented to room. Safety measures in place. Call bell within reach.

## 2016-10-23 NOTE — H&P (Signed)
BP (!) 98/51   Pulse 80   Temp 98.1 F (36.7 C) (Oral)   Resp 20   SpO2 99%  [name] Bean is a long time patient of mine who underwent a 4-5 corpectomy and [name] Bean had fusion C3 to C6. [name] Bean has a solid fusion. Over the last 3 years [name] Bean has noticed more and more difficulty with [name] Bean gait. [name] Bean is more unstable. [name] Bean has been falling more. 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 [name] Bean says unequivocally [name] Bean improved, [name] Bean balance was fine. [name] Bean did not use a cane or the like to get around. Over the last 2 years that has not been the case. [name] Bean is currently on disability, but was at [facility] and went to see the [place]. For whatever reason, and it is curious at best, the [place] neurosurgeon stated that [name] Bean needed to come back to see me because this operation, which was done well over a decade ago, and had no complications from, was something that they could not handle. [name] Bean has not talked about any new bowel or bladder problems. [name] Bean has hypertension and diabetes. No history of substance abuse. [name] Bean does drink alcohol socially. [name] Bean is right handed. [name] Bean is 65 years of age. Currently, [name] Bean takes Acetaminophen, Aspirin, carvedilol, Diclofenac Gel, Escitalopram, Hydralazine. [name] Bean takes Insulin, Latanoprost, Losartan, Melatonin, Metformin, Rosuvastatin, Spironolactone, and Torsemide. PAST MEDICAL HISTORY: Includes hypertension, hyperlipidemia, type 2 diabetes, coronary artery disease, osteoarthritis of both hips, left knee, low back pain, fractured humerus, and peripheral neuropathy. Mother is 49, has dementia. Father is deceased. [name] Bean feels that [name] Bean has weakness in the legs, numbness, tingling in the right hand. [name] Bean went into congestive heart failure recently and was admitted to the hospital where [name] Bean stayed for 10 days. [name] Bean was diuresed and sent home and then did undergo some rehab. REVIEW OF SYSTEMS: Positive for glaucoma, cataracts, shortness of breath, hypercholesterolemia,  swelling in the feet, leg pain with walking, nausea, neck pain, leg weakness, depression, and problems with coordination in the extremities. PHYSICAL EXAM: [name] Bean is alert, oriented by 4. Speech is clear and fluent. Tongue and uvula midline. [name] Bean does have somewhat puffed facies. [name] Bean has full strength in the upper and lower extremities. [name] Bean is spastic, has 3 to 4+ reflexes at biceps, triceps, brachioradialis, knees and ankles. Proprioception in the lower extremities is poor, but intact. 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 This is confirmed secondary to CT scan done in 2013, which showed calcification of the ligament. [name] Bean doesn't have a disc herniation there, but just has thickening and calcification, thus [name] Bean is stenotic at the 2-3 level. Where [name] Bean had [name] Bean operation [name] Bean has a solid fusion. [name] Bean has a more than adequate decompression of the canal and below this [name] Bean has some osteophytes, but this is not the problem. The problem is a narrowing at C2-3. I think [name] Bean can undergo posterior decompression because to try to do something from the front would be a C2 corpectomy which is well beyond my skill set, never having performed one, but I will obtain a CT, so we can get a good idea of how much calcification there is. I will also have a chance to just see [name] Bean. [name] Bean returns after undergoing a CT of the cervical spine. 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 [name] Bean has a very solid fusion from C3-C6. What I will do is a simple decompression posteriorly C2-3, possibly C3-4. [name] Bean is fused there, but I don't know how much I need to do there. [name] Bean is tight at the C2-3 disc space and that can easily be handled.

## 2016-10-23 NOTE — Progress Notes (Signed)
This morning's blood sugar was 267. Informed Dr. Jillyn Hidden, and he wants her to take 6 units of novolog. (given by patient herself in right thigh)

## 2016-10-23 NOTE — Progress Notes (Signed)
Patient ID: [name] Bean, female   DOB: 1951/01/02, 66 y.o.   MRN: [id number] Alert and oriented x 4 Moving all extremities Wound is clean, dry, no signs of infection

## 2016-10-23 NOTE — Anesthesia Procedure Notes (Signed)
Procedure Name: Intubation Date/Time: 10/23/2016 9:49 AM Performed by: Scheryl Darter Pre-anesthesia Checklist: Patient identified, Emergency Drugs available, Suction available and Patient being monitored Patient Re-evaluated:Patient Re-evaluated prior to induction Oxygen Delivery Method: Circle System Utilized Preoxygenation: Pre-oxygenation with 100% oxygen Induction Type: IV induction Ventilation: Mask ventilation without difficulty Laryngoscope Size: Glidescope Grade View: Grade IV Tube type: Oral Tube size: 7.5 mm Number of attempts: 1 Airway Equipment and Method: Oral airway,  Video-laryngoscopy and Rigid stylet Placement Confirmation: ETT inserted through vocal cords under direct vision,  positive ETCO2 and breath sounds checked- equal and bilateral Secured at: 23 cm Tube secured with: Tape Dental Injury: Teeth and Oropharynx as per pre-operative assessment  Difficulty Due To: Difficulty was anticipated, Difficult Airway- due to reduced neck mobility, Difficult Airway- due to anterior larynx, Difficult Airway- due to limited oral opening, Difficult Airway- due to dentition and Difficult Airway- due to large tongue

## 2016-10-23 NOTE — Anesthesia Postprocedure Evaluation (Signed)
Anesthesia Post Note  Patient: [name] Bean  Procedure(s) Performed: Procedure(s) (LRB): POSTERIOR CERVICAL LAMINECTOMY MULTI LEVEL CERVICAL TWO- CERVICAL THREE, CERVICAL THREE- CERVICAL FOUR (N/A)     Patient location during evaluation: PACU Anesthesia Type: General Level of consciousness: awake Pain management: pain level controlled Vital Signs Assessment: post-procedure vital signs reviewed and stable Respiratory status: spontaneous breathing Cardiovascular status: stable Postop Assessment: no signs of nausea or vomiting Anesthetic complications: no    Last Vitals:  Vitals:   10/23/16 1230 10/23/16 1235  BP: (!) 128/57   Pulse: 78   Resp: (!) 0   Temp:  (!) 36.1 C    Last Pain:  Vitals:   10/23/16 1215  TempSrc:   PainSc: 9    Pain Goal: Patients Stated Pain Goal: 4 (10/23/16 1150)               Jakyah Bradby JR,JOHN Mate[place] Flow

## 2016-10-24 ENCOUNTER — Encounter (HOSPITAL_COMMUNITY): Payer: Self-pay | Admitting: Neurosurgery

## 2016-10-24 DIAGNOSIS — M4802 Spinal stenosis, cervical region: Secondary | ICD-10-CM | POA: Diagnosis not present

## 2016-10-24 LAB — GLUCOSE, CAPILLARY: Glucose-Capillary: 222 mg/dL — ABNORMAL HIGH (ref 65–99)

## 2016-10-24 NOTE — Progress Notes (Signed)
Pt seen and examined.  No issues overnight.  Still has not urinated on own. Had I&O cath earlier. No urge to use restroom currently Pain well controlled Ambulating without difficulties Would like to go home today  EXAM: Temp:  [97 F (36.1 C)-98.1 F (36.7 C)] 98.1 F (36.7 C) (07/14 0606) Pulse Rate:  [69-82] 80 (07/14 0606) Resp:  [0-20] 18 (07/14 0150) BP: (98-149)/(39-64) 135/54 (07/14 0606) SpO2:  [94 %-100 %] 96 % (07/14 0606) Weight:  [90 kg (198 lb 6.4 oz)] 90 kg (198 lb 6.4 oz) (07/13 1820) Intake/Output      07/13 0701 - 07/14 0700 07/14 0701 - 07/15 0700   I.V. (mL/kg) 2287.3 (25.4)    Total Intake(mL/kg) 2287.3 (25.4)    Urine (mL/kg/hr) 500    Blood 35    Total Output 535     Net +1752.3           Awake and alert Follows commands throughout Full strength Incision with dried blood at base of bandage, otherwise no signs of infection  Plan Making great progress Would like to go home today. If she urinates, she can be discharged. Orders signed.  Otherwise, will see her tomorrow

## 2016-10-24 NOTE — Care Management Note (Signed)
Case Management Note  Patient Details  Name: VESPER TRANT MRN: [id number] Date of Birth: 11/19/1950  Subjective/Objective:                 Patient with order to DC to home. Chart reviewed, no HH, medication, or other orders identified at time of discharge.    Action/Plan:  DC to home self care.  Expected Discharge Date:  10/24/16               Expected Discharge Plan:  Home/Self Care  In-House Referral:     Discharge planning Services  CM Consult  Post Acute Care Choice:    Choice offered to:     DME Arranged:    DME Agency:     HH Arranged:    HH Agency:     Status of Service:  Completed, signed off  If discussed at [employer] of Stay Meetings, dates discussed:    Additional Comments:  Carles Collet, RN 10/24/2016, 8:48 AM

## 2016-10-24 NOTE — Progress Notes (Signed)
Discussed with patient discharge instructions she verbalized agreement and understanding. Patient to go with all belongings to go home in private vehicle.

## 2016-10-24 NOTE — Discharge Summary (Signed)
Physician Discharge Summary  Patient ID: [name] Bean MRN: [id number] DOB/AGE: 1950/11/16 66 y.o.  Admit date: 10/23/2016 Discharge date: 10/24/2016  Admission Diagnoses:  Cervical spinal stenosis  Discharge Diagnoses:  Same Active Problems:   Cervical stenosis of spinal canal   Discharged Condition: Stable  Hospital Course:  [name] Bean is a 66 y.o. female who was admitted for the below procedure. There were no post operative complications. At time of discharge, pain was well controlled, ambulating halls, tolerating po, voiding normal. Ready for discharge.  Treatments: Surgery - decompression posteriorly C2-3 & C3-4  Discharge Exam: Blood pressure (!) 135/54, pulse 80, temperature 98.1 F (36.7 C), temperature source Oral, resp. rate 18, height 5\' 3"  (1.6 m), weight 90 kg (198 lb 6.4 oz), SpO2 96 %. Awake, alert, oriented Speech fluent, appropriate CN grossly intact 5/5 BUE/BLE Wound c/d/i  Disposition:   Discharge Instructions    Call MD for:  difficulty breathing, headache or visual disturbances    Complete by:  As directed    Call MD for:  persistant dizziness or light-headedness    Complete by:  As directed    Call MD for:  redness, tenderness, or signs of infection (pain, swelling, redness, odor or green/yellow discharge around incision site)    Complete by:  As directed    Call MD for:  severe uncontrolled pain    Complete by:  As directed    Call MD for:  temperature >100.4    Complete by:  As directed    Diet general    Complete by:  As directed    Driving Restrictions    Complete by:  As directed    Do not drive until given clearance.   Increase activity slowly    Complete by:  As directed    Lifting restrictions    Complete by:  As directed    Do not lift anything >10lbs. Avoid bending and twisting in awkward positions. Avoid bending at the back.     Allergies as of 10/24/2016      Reactions   Sitagliptin Other (See Comments)   Had  pancreatitis while on Januvia       Medication List    TAKE these medications   aspirin EC 81 MG tablet Take 81 mg by mouth daily.   BD PEN NEEDLE NANO U/F 32G X 4 MM Misc Generic drug:  Insulin Pen Needle USE TO INJECT INSULIN 5 TIMES A DAY   carvedilol 25 MG tablet Commonly known as:  COREG Take 1 tablet (25 mg total) by mouth 2 (two) times daily with a meal.   CHANTIX 1 MG tablet Generic drug:  varenicline Take 1 mg by mouth 2 (two) times daily.   escitalopram 20 MG tablet Commonly known as:  LEXAPRO Take 20 mg by mouth daily.   hydrALAZINE 50 MG tablet Commonly known as:  APRESOLINE Take 50-100 mg by mouth See admin instructions. Take 100 mg by mouth in the morning and take 50 mg by mouth in the evening   HYDROcodone-acetaminophen 5-325 MG tablet Commonly known as:  NORCO/VICODIN Take 1 tablet by mouth every 6 (six) hours as needed for moderate pain.   insulin aspart 100 UNIT/ML injection Commonly known as:  novoLOG Inject 12 Units into the skin 3 (three) times daily before meals.   LANTUS 100 UNIT/ML injection Generic drug:  insulin glargine Inject 22 Units into the skin at bedtime.   latanoprost 0.005 % ophthalmic solution Commonly known as:  Tax inspector  1 drop into both eyes at bedtime.   losartan 100 MG tablet Commonly known as:  COZAAR Take 100 mg by mouth daily.   Melatonin 3 MG Tabs Take 3 mg by mouth at bedtime as needed (sleep).   rosuvastatin 10 MG tablet Commonly known as:  CRESTOR Take 1 tablet (10 mg total) by mouth daily. What changed:  how much to take   spironolactone 25 MG tablet Commonly known as:  ALDACTONE Take 25 mg by mouth daily.   tiZANidine 4 MG tablet Commonly known as:  ZANAFLEX Take 1 tablet (4 mg total) by mouth every 6 (six) hours as needed for muscle spasms.   torsemide 20 MG tablet Commonly known as:  DEMADEX Take 20-40 mg by mouth See admin instructions. Take 20 mg by mouth daily and may take 20-40 mg by mouth  as needed for swelling      Follow-up Information    Ashok Pall, MD Follow up in 3 week(s).   Specialty:  Neurosurgery Why:  please call to make an appointment Contact information: [address] [place] [address] [phone]           Signed: Traci Sermon 10/24/2016, 7:56 AM

## 2016-10-28 NOTE — Op Note (Signed)
10/23/2016  7:18 PM  PATIENT:  [name] Bean  65 y.o. female with cervical stenosis at C2/3 causing spinal cord compression. I have recommended that she undergo a laminectomy for spinal canal decompression.  PRE-OPERATIVE DIAGNOSIS:  SPINAL STENOSIS OF CERVICAL REGION C2/3 POST-OPERATIVE DIAGNOSIS:  SPINAL STENOSIS OF CERVICAL REGION C2/3  PROCEDURE:  Procedure(s): POSTERIOR CERVICAL LAMINECTOMY MULTI LEVEL CERVICAL TWO- CERVICAL THREE, CERVICAL THREE- CERVICAL FOUR  SURGEON: Surgeon(s): Ashok Pall, MD Consuella Lose, MD  ASSISTANTS:Nundkumar, Nena Polio  ANESTHESIA:   general  EBL:  No intake/output data recorded.  BLOOD ADMINISTERED:none  CELL SAVER GIVEN:none  COUNT:per nursing  DRAINS: none   SPECIMEN:  No Specimen  DICTATION: [name] Bean was taken to the operating room, intubated, and placed under a general anesthetic without difficulty. Once adequate anesthesia was obtained I placed her head in a Mayfield head holder. She was positioned prone by attaching the Mayfield to the adapter on the bed. Her head was shaved, was prepped and was draped in a sterile manner. I infiltrated the planned incision with lidocaine. I opened the skin with a 10 blade and dissected with monopolar cautery to the nuchal ligament. I exposed the lamina of L2, and L3 bilaterally after placing retractors. I used the Leksell rongeur, drill, and Kerrison punches to perform a complete laminectomy of C2, and a partial laminectomy of C3. We decompressed the spinal canal bilaterally. After inspecting it I felt the decompression was sufficient. We then closed the wound in layers. We approximated the nuchal ligament, subcutaneous, and subcuticular planes with vicryl sutures. I applied dermabond, and an occlusive bandage for a sterile dressing. I removed the head holder, after rolling her onto the stretcher in the OR. She was extubated and taken to the PACU.   PLAN OF CARE: Admit for overnight  observation  PATIENT DISPOSITION:  PACU - hemodynamically stable.   Delay start of Pharmacological VTE agent (>24hrs) due to surgical blood loss or risk of bleeding:  yes

## 2016-11-04 ENCOUNTER — Telehealth (HOSPITAL_COMMUNITY): Payer: Self-pay | Admitting: Vascular Surgery

## 2016-11-04 NOTE — Telephone Encounter (Signed)
Spoke to pt to move appt from 10/11 to 10/15

## 2016-11-23 ENCOUNTER — Other Ambulatory Visit (HOSPITAL_COMMUNITY): Payer: Self-pay | Admitting: Internal Medicine

## 2016-12-01 ENCOUNTER — Other Ambulatory Visit (HOSPITAL_COMMUNITY): Payer: Self-pay | Admitting: Internal Medicine

## 2016-12-01 ENCOUNTER — Ambulatory Visit: Payer: BC Managed Care – PPO | Attending: Neurosurgery | Admitting: Physical Therapy

## 2016-12-01 ENCOUNTER — Encounter: Payer: Self-pay | Admitting: Physical Therapy

## 2016-12-01 DIAGNOSIS — R2689 Other abnormalities of gait and mobility: Secondary | ICD-10-CM | POA: Diagnosis present

## 2016-12-01 DIAGNOSIS — M542 Cervicalgia: Secondary | ICD-10-CM | POA: Diagnosis present

## 2016-12-01 DIAGNOSIS — M6281 Muscle weakness (generalized): Secondary | ICD-10-CM | POA: Insufficient documentation

## 2016-12-01 NOTE — Patient Instructions (Signed)
AROM: Neck Rotation   Turn head slowly to look over one shoulder, then the other. Hold each position _10___ seconds. Repeat _5-10__ times per set. Do __2__ sets per session. Do ___2_ sessions per day.  http://orth.exer.us/294   Copyright  VHI. All rights reserved.  AROM: Lateral Neck Flexion   Slowly tilt head toward one shoulder, then the other. Hold each position 10 sec  2__ sessions per day.  http://orth.exer.us/296   Copyright  VHI. All rights reserved.  Extension  Copyright  VHI. All rights reserved.  AROM: Neck Flexion   Bend head forward. Hold __10__ seconds. Repeat __5-10__ times per set. Do __2__ sets per session. Do _2___ sessions per day.  http://orth.exer.us/298   Copyright  VHI. All rights reserved.    Scapular Retraction (Standing)    With arms at sides, pinch shoulder blades together. Repeat __10__ times per set. Do __2__ sets per session. Do ___2_ sessions per day.  http://orth.exer.us/945   Copyright  VHI. All rights reserved.

## 2016-12-02 NOTE — Therapy (Signed)
[place], [name], [address] Phone: [phone]   Fax:  [phone]  Physical Therapy Evaluation  Patient Details  Name: [name] Bean MRN: [id number] Date of Birth: 1950-06-02 Referring Provider: Dr. Ashok Pall   Encounter Date: 12/01/2016      PT End of Session - 12/01/16 1604    Visit Number 1   Number of Visits 12   PT Start Time 3570   PT Stop Time 1630   PT Time Calculation (min) 39 min   Activity Tolerance Patient tolerated treatment well   Behavior During Therapy [facility] for tasks assessed/performed      Past Medical History:  Diagnosis Date  . Anemia   . Anxiety   . Arthritis   . Asthma   . Broken arm    left arm  . Bronchitis   . Bursitis of left hip   . CAD (coronary artery disease)   . Candidiasis, vagina   . Cardiac arrest ([place]) 06/20/2016   at [facility]; ? due to flash pulm edema vs undertreated chronic CHF  . Cerumen impaction   . CHF (congestive heart failure) ([place])    RECENT ADMIT TO [facility]   . Colon, diverticulosis   . Depression   . Diabetes mellitus    15 YRS AGO  . Fatigue   . Gastroenteritis   . Hot flashes, menopausal   . Hyperlipidemia   . Knee pain, left   . Lumbar back pain   . Maxillary sinusitis    history  . Muscle tear    right gluteus  . Nausea   . Other B-complex deficiencies   . Otitis media, acute    left  . Peripheral neuropathy   . Spinal stenosis of lumbar region   . Tubulovillous adenoma of colon   . Unspecified essential hypertension     Past Surgical History:  Procedure Laterality Date  . CARDIAC CATHETERIZATION  2015, 2009, 2006  . CERVICAL SPINE SURGERY  2003  . CORONARY ANGIOPLASTY WITH STENT PLACEMENT  2002, 2015  . EYE SURGERY     cataracts bilaterally  . POSTERIOR CERVICAL LAMINECTOMY N/A 10/23/2016   Procedure: POSTERIOR CERVICAL LAMINECTOMY MULTI LEVEL CERVICAL TWO- CERVICAL THREE, CERVICAL THREE- CERVICAL FOUR;  Surgeon:  Ashok Pall, MD;  Location: [place];  Service: Neurosurgery;  Laterality: N/A;  POSTERIOR    There were no vitals filed for this visit.       Subjective Assessment - 12/01/16 1554    Subjective Pt with history gait problems, loss of coordination and weakness UE/LE for many yrs.  She ultimately underwent surgery 10/23/16 posterior laminectomy by Dr. Christella Noa.  She reports symptoms of continued difficulty walking, transferring from a standard surface, and a new onset of neck pain with ADLs.  She walks with a cane.  Rt. hand numb.  Both arms and both LE feel weak. but L UE is decreased ROM due to old fracture.    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     Patient Stated Goals Wants to regain mobility and strength.     Currently in Pain? Yes   Pain Score 2    Pain Location Neck   Pain Orientation Posterior   Pain  Descriptors / Indicators Aching;Tightness   Pain Type Chronic pain   Pain Onset More than a month ago   Pain Frequency Intermittent   Aggravating Factors  when she has been up all day, walking    Pain Relieving Factors resting, meds    Effect of Pain on Daily Activities limits her mobility, but more limited by gait and balance              [facility] PT Assessment - 12/02/16 0001      Assessment   Medical Diagnosis myelopathy, cervical s/p laminectomy    Referring Provider Dr. Ashok Pall    Onset Date/Surgical Date 10/23/16   Prior Therapy Yes prior to surgery      Precautions   Precautions None   Precaution Comments none current      Restrictions   Weight Bearing Restrictions No     Balance Screen   Has the patient fallen in the past 6 months Yes   How many times? 8   Has the patient had a decrease in  activity level because of a fear of falling?  Yes   Is the patient reluctant to leave their home because of a fear of falling?  No     Home Environment   Living Environment Private residence   Living Arrangements Alone   Type of [place] Access Level entry   Home Layout One level     Prior Function   Level of Independence Independent   Leisure has a lot of friends, shopping, walking , travel      Cognition   Overall Cognitive Status Within Functional Limits for tasks assessed     Observation/Other Assessments   Focus on Therapeutic Outcomes (FOTO)  NT      Sensation   Light Touch Impaired by gross assessment   Additional Comments Rt. UE      Posture/Postural Control   Posture/Postural Control Postural limitations   Postural Limitations Rounded Shoulders;Forward head;Increased thoracic kyphosis;Flexed trunk     AROM   Right Shoulder Flexion 110 Degrees   Left Shoulder Flexion 80 Degrees   Cervical Flexion 35   Cervical Extension 10   Cervical - Right Side Bend 20   Cervical - Left Side Bend 20   Cervical - Right Rotation 35   Cervical - Left Rotation 30     Strength   Right/Left Shoulder --  4/5    Right Hip Flexion 3+/5   Left Hip Flexion 3+/5   Right Knee Flexion 5/5   Right Knee Extension 5/5   Left Knee Flexion 5/5   Left Knee Extension 5/5     Palpation   Palpation comment pain posteriorly C2-C3 and tender below, non tender in periscapular mm, puffy L lateral cervicals into supraclavicular region     Transfers   Five time sit to stand comments  18 sec use of hands needed      Ambulation/Gait   Ambulation Distance (Feet) 150 Feet   Assistive device Small based quad cane   Gait Pattern Decreased dorsiflexion - left;Ataxic           PT Education - 12/02/16 0736    Education provided Yes   Education Details PT/POC, cervical AROM, posture, use ice for pain    Person(s) Educated Patient   Methods Explanation;Handout   Comprehension  Verbalized understanding;Need further instruction          PT Short Term Goals - 12/02/16 6811  PT SHORT TERM GOAL #1   Title Pt will complete balance assessment and understand results, set goal    Time 3   Period Weeks   Status New   Target Date 12/22/16     PT SHORT TERM GOAL #2   Title Pt will be I with simple HEP for cervical spine, UE strength an postuure    Time 3   Period Weeks   Status New   Target Date 12/22/16     PT SHORT TERM GOAL #3   Title Pt will report increased ability to stand from a sitting position and transfer in and out of the bed (25% improved)    Time 3   Period Weeks   Status New   Target Date 12/22/16           PT Long Term Goals - 12/02/16 0908      PT LONG TERM GOAL #1   Title Pt will be able to walk in the community as needed with only minimal increase in neck pain.    Time 6   Period Weeks   Status New   Target Date 01/12/17     PT LONG TERM GOAL #2   Title Pt will safely negotiate 12 or more stairs in the community with confidence and min to no fatigue.    Time 6   Period Weeks   Status New   Target Date 01/12/17     PT LONG TERM GOAL #3   Title Pt will improve balance score, TBA    Time 6   Period Weeks   Status New   Target Date 01/12/17     PT LONG TERM GOAL #4   Title Pt will increase cervical AROM by 10 or more degrees in each direction, no lasting pain for improved driving, travel.    Time 6   Period Weeks   Status New   Target Date 01/12/17                Plan - 12/02/16 0747    Clinical Impression Statement This patient presents for mod complexity eval of cervical myelopathy and spondylosis status post laminectomy.  Muscle strength is good but balance and gait are impaired, very uncoordinated/ataxic. UE mobility is impaired due to chronic conditions.  She reports frequent falls, develops a decrease in L ankle DF with longer distances.    History and Personal Factors relevant to plan of care:  previous cervical spine fusion, L prox humerus fx, cardiac, HTN, DM    Clinical Presentation Evolving   Clinical Presentation due to: increased neck pain , 2nd neck surgery, neuro involvement   Clinical Decision Making Moderate   Rehab Potential Good   PT Frequency 2x / week   PT Duration 6 weeks   PT Treatment/Interventions ADLs/Self Care Home Management;Patient/family education;DME Instruction;Gait training;Stair training;Cryotherapy;Electrical Stimulation;Moist Heat;Ultrasound;Neuromuscular re-education;Passive range of motion;Balance training;Therapeutic exercise;Therapeutic activities;Manual techniques;Functional mobility training;Taping   PT Next Visit Plan gait assessment (s), HEP (C-AROM) , try manual to cervicals    PT Home Exercise Plan C AROM gentle , scap retraction    Consulted and Agree with Plan of Care Patient      Patient will benefit from skilled therapeutic intervention in order to improve the following deficits and impairments:  Abnormal gait, Decreased endurance, Hypomobility, Impaired sensation, Obesity, Increased edema, Decreased activity tolerance, Decreased strength, Increased fascial restricitons, Impaired UE functional use, Pain, Difficulty walking, Decreased mobility, Decreased balance, Decreased range of motion, Improper body mechanics, Postural dysfunction,  Impaired flexibility, Decreased coordination, Decreased safety awareness  Visit Diagnosis: Cervicalgia  Other abnormalities of gait and mobility  Muscle weakness (generalized)      G-Codes - 30-Dec-2016 1630    Functional Assessment Tool Used (Outpatient Only) clinical judgment    Functional Limitation Mobility: Walking and moving around   Mobility: Walking and Moving Around Current Status ([id number]) At least 60 percent but less than 80 percent impaired, limited or restricted   Mobility: Walking and Moving Around Goal Status [phone]) At least 40 percent but less than 60 percent impaired, limited or restricted        Problem List Patient Active Problem List   Diagnosis Date Noted  . Cervical stenosis of spinal canal 10/23/2016  . Neck swelling 09/28/2011  . Weight gain 01/02/2011  . Carpal tunnel syndrome 11/02/2010  . Leg swelling 08/26/2010  . ADRENAL MASS, LEFT 05/23/2010  . SPINAL STENOSIS, LUMBAR 12/27/2009  . B12 DEFICIENCY 06/10/2009  . ANEMIA 06/10/2009  . BACK PAIN, LUMBAR 11/06/2008  . KNEE PAIN, LEFT 02/03/2008  . DIABETES MELLITUS, TYPE II, UNCONTROLLED 01/12/2008  . MENOPAUSE-RELATED VASOMOTOR SYMPTOMS, HOT FLASHES 06/23/2007  . BURSITIS, LEFT HIP 06/23/2007  . HYPERLIPIDEMIA 03/25/2007  . ANXIETY 03/25/2007  . DEPRESSION 03/25/2007  . PERIPHERAL NEUROPATHY 03/25/2007  . HYPERTENSION 03/25/2007  . CORONARY ARTERY DISEASE 03/25/2007  . DIVERTICULOSIS, COLON 03/25/2007  . TUBULOVILLOUS ADENOMA, COLON 02/18/2007    Rekita Miotke 12/02/2016, 9:38 AM  [facility] [address] [place], [name], [address] Phone: [phone]   Fax:  [phone]  Name: [name] Bean MRN: [id number] Date of Birth: October 29, 1950   Raeford Razor, PT 12/02/16 9:38 AM Phone: [phone] Fax: [phone]

## 2016-12-09 ENCOUNTER — Ambulatory Visit: Payer: BC Managed Care – PPO | Admitting: Physical Therapy

## 2016-12-09 DIAGNOSIS — M542 Cervicalgia: Secondary | ICD-10-CM

## 2016-12-09 DIAGNOSIS — R2689 Other abnormalities of gait and mobility: Secondary | ICD-10-CM

## 2016-12-09 DIAGNOSIS — M6281 Muscle weakness (generalized): Secondary | ICD-10-CM

## 2016-12-09 NOTE — Therapy (Signed)
[place], [name], [address] Phone: [phone]   Fax:  [phone]  Physical Therapy Treatment  Patient Details  Name: [name] Bean MRN: [id number] Date of Birth: Jan 06, 1951 Referring Provider: Dr. Ashok Pall   Encounter Date: 12/09/2016      PT End of Session - 12/09/16 0947    Visit Number 2   Number of Visits 12   PT Start Time 0932   PT Stop Time 1015   PT Time Calculation (min) 43 min      Past Medical History:  Diagnosis Date  . Anemia   . Anxiety   . Arthritis   . Asthma   . Broken arm    left arm  . Bronchitis   . Bursitis of left hip   . CAD (coronary artery disease)   . Candidiasis, vagina   . Cardiac arrest ([place]) 06/20/2016   at [facility]; ? due to flash pulm edema vs undertreated chronic CHF  . Cerumen impaction   . CHF (congestive heart failure) ([place])    RECENT ADMIT TO [facility]   . Colon, diverticulosis   . Depression   . Diabetes mellitus    15 YRS AGO  . Fatigue   . Gastroenteritis   . Hot flashes, menopausal   . Hyperlipidemia   . Knee pain, left   . Lumbar back pain   . Maxillary sinusitis    history  . Muscle tear    right gluteus  . Nausea   . Other B-complex deficiencies   . Otitis media, acute    left  . Peripheral neuropathy   . Spinal stenosis of lumbar region   . Tubulovillous adenoma of colon   . Unspecified essential hypertension     Past Surgical History:  Procedure Laterality Date  . CARDIAC CATHETERIZATION  2015, 2009, 2006  . CERVICAL SPINE SURGERY  2003  . CORONARY ANGIOPLASTY WITH STENT PLACEMENT  2002, 2015  . EYE SURGERY     cataracts bilaterally  . POSTERIOR CERVICAL LAMINECTOMY N/A 10/23/2016   Procedure: POSTERIOR CERVICAL LAMINECTOMY MULTI LEVEL CERVICAL TWO- CERVICAL THREE, CERVICAL THREE- CERVICAL FOUR;  Surgeon: Ashok Pall, MD;  Location: [place];  Service: Neurosurgery;  Laterality: N/A;  POSTERIOR    There were no vitals filed  for this visit.      Subjective Assessment - 12/09/16 0946    Subjective Uncomfortable last night, couldn't get neck in a comfotable position.    Currently in Pain? No/denies                         [facility] Adult PT Treatment/Exercise - 12/09/16 0001      Transfers   Transfers Supine to Sit;Sit to Supine   Supine to Sit 7: Independent  verbal cues   Sit to Supine 7: Independent  verbal cues      Neck Exercises: Machines for Strengthening   Other Machines for Strengthening Nustep L2 5 minutes UE/LE      Neck Exercises: Seated   Other Seated Exercise Cervical AROM x 4 each way      Neck Exercises: Supine   Neck Retraction 10 reps   Other Supine Exercise chest press with cane 10 x 2 while maintaining chin tuck, required cues    Other Supine Exercise scapular retraction into mat table x 10      Knee/Hip Exercises: Seated   Sit to Sand 10 reps;without UE support  PT Short Term Goals - 12/02/16 0905      PT SHORT TERM GOAL #1   Title Pt will complete balance assessment and understand results, set goal    Time 3   Period Weeks   Status New   Target Date 12/22/16     PT SHORT TERM GOAL #2   Title Pt will be I with simple HEP for cervical spine, UE strength an postuure    Time 3   Period Weeks   Status New   Target Date 12/22/16     PT SHORT TERM GOAL #3   Title Pt will report increased ability to stand from a sitting position and transfer in and out of the bed (25% improved)    Time 3   Period Weeks   Status New   Target Date 12/22/16           PT Long Term Goals - 12/02/16 0908      PT LONG TERM GOAL #1   Title Pt will be able to walk in the community as needed with only minimal increase in neck pain.    Time 6   Period Weeks   Status New   Target Date 01/12/17     PT LONG TERM GOAL #2   Title Pt will safely negotiate 12 or more stairs in the community with confidence and min to no fatigue.    Time 6   Period  Weeks   Status New   Target Date 01/12/17     PT LONG TERM GOAL #3   Title Pt will improve balance score, TBA    Time 6   Period Weeks   Status New   Target Date 01/12/17     PT LONG TERM GOAL #4   Title Pt will increase cervical AROM by 10 or more degrees in each direction, no lasting pain for improved driving, travel.    Time 6   Period Weeks   Status New   Target Date 01/12/17               Plan - 12/09/16 1127    Clinical Impression Statement Pt reports no pain now however she has difficulty getting neck comfortable at night. Reviewed gentle AROM and scapular squeezes. Also perfromed chin tuck in supine without increased pain only fatigue in deep neck flexors. Practiced supine <-> transfers and sit-stand. Able to do both independently with verbal cues.    PT Next Visit Plan gait/balance  assessment (s), HEP (C-AROM) , try manual to cervicals , add to HEP    PT Home Exercise Plan C AROM gentle , scap retraction    Consulted and Agree with Plan of Care Patient      Patient will benefit from skilled therapeutic intervention in order to improve the following deficits and impairments:  Abnormal gait, Decreased endurance, Hypomobility, Impaired sensation, Obesity, Increased edema, Decreased activity tolerance, Decreased strength, Increased fascial restricitons, Impaired UE functional use, Pain, Difficulty walking, Decreased mobility, Decreased balance, Decreased range of motion, Improper body mechanics, Postural dysfunction, Impaired flexibility, Decreased coordination, Decreased safety awareness  Visit Diagnosis: Cervicalgia  Other abnormalities of gait and mobility  Muscle weakness (generalized)     Problem List Patient Active Problem List   Diagnosis Date Noted  . Cervical stenosis of spinal canal 10/23/2016  . Neck swelling 09/28/2011  . Weight gain 01/02/2011  . Carpal tunnel syndrome 11/02/2010  . Leg swelling 08/26/2010  . ADRENAL MASS, LEFT 05/23/2010  .  SPINAL STENOSIS,  LUMBAR 12/27/2009  . B12 DEFICIENCY 06/10/2009  . ANEMIA 06/10/2009  . BACK PAIN, LUMBAR 11/06/2008  . KNEE PAIN, LEFT 02/03/2008  . DIABETES MELLITUS, TYPE II, UNCONTROLLED 01/12/2008  . MENOPAUSE-RELATED VASOMOTOR SYMPTOMS, HOT FLASHES 06/23/2007  . BURSITIS, LEFT HIP 06/23/2007  . HYPERLIPIDEMIA 03/25/2007  . ANXIETY 03/25/2007  . DEPRESSION 03/25/2007  . PERIPHERAL NEUROPATHY 03/25/2007  . HYPERTENSION 03/25/2007  . CORONARY ARTERY DISEASE 03/25/2007  . DIVERTICULOSIS, COLON 03/25/2007  . TUBULOVILLOUS ADENOMA, COLON 02/18/2007    Dorene Ar, PTA 12/09/2016, 11:34 AM  [facility] [address] [place], [name], [address] Phone: [phone]   Fax:  [phone]  Name: KAYLEY ZEIDERS MRN: [id number] Date of Birth: 1950-12-29

## 2016-12-10 ENCOUNTER — Encounter: Payer: Self-pay | Admitting: Physical Therapy

## 2016-12-10 ENCOUNTER — Ambulatory Visit: Payer: BC Managed Care – PPO | Admitting: Physical Therapy

## 2016-12-10 DIAGNOSIS — R2689 Other abnormalities of gait and mobility: Secondary | ICD-10-CM

## 2016-12-10 DIAGNOSIS — M542 Cervicalgia: Secondary | ICD-10-CM

## 2016-12-10 DIAGNOSIS — M6281 Muscle weakness (generalized): Secondary | ICD-10-CM

## 2016-12-10 NOTE — Therapy (Signed)
[place], [name], [address] Phone: [phone]   Fax:  [phone]  Physical Therapy Treatment  Patient Details  Name: [name] Bean MRN: [id number] Date of Birth: [birth year] Referring Provider: Dr. Ashok Pall   Encounter Date: 12/10/2016      PT End of Session - 12/10/16 1424    Visit Number 3   Number of Visits 12   PT Start Time 5009   PT Stop Time 1430   PT Time Calculation (min) 48 min   Activity Tolerance Patient tolerated treatment well   Behavior During Therapy [facility] for tasks assessed/performed      Past Medical History:  Diagnosis Date  . Anemia   . Anxiety   . Arthritis   . Asthma   . Broken arm    left arm  . Bronchitis   . Bursitis of left hip   . CAD (coronary artery disease)   . Candidiasis, vagina   . Cardiac arrest ([place]) 06/20/2016   at [facility]; ? due to flash pulm edema vs undertreated chronic CHF  . Cerumen impaction   . CHF (congestive heart failure) ([place])    RECENT ADMIT TO [facility]   . Colon, diverticulosis   . Depression   . Diabetes mellitus    15 YRS AGO  . Fatigue   . Gastroenteritis   . Hot flashes, menopausal   . Hyperlipidemia   . Knee pain, left   . Lumbar back pain   . Maxillary sinusitis    history  . Muscle tear    right gluteus  . Nausea   . Other B-complex deficiencies   . Otitis media, acute    left  . Peripheral neuropathy   . Spinal stenosis of lumbar region   . Tubulovillous adenoma of colon   . Unspecified essential hypertension     Past Surgical History:  Procedure Laterality Date  . CARDIAC CATHETERIZATION  2015, 2009, 2006  . CERVICAL SPINE SURGERY  2003  . CORONARY ANGIOPLASTY WITH STENT PLACEMENT  2002, 2015  . EYE SURGERY     cataracts bilaterally  . POSTERIOR CERVICAL LAMINECTOMY N/A 10/23/2016   Procedure: POSTERIOR CERVICAL LAMINECTOMY MULTI LEVEL CERVICAL TWO- CERVICAL THREE, CERVICAL THREE- CERVICAL FOUR;  Surgeon: Ashok Pall, MD;  Location: [place];  Service: Neurosurgery;  Laterality: N/A;  POSTERIOR    There were no vitals filed for this visit.      Subjective Assessment - 12/10/16 1344    Subjective Felt strong after last time but then I sat down.  No pain but I'm weak today. Pt a bit late.    Currently in Pain? No/denies            [facility] PT Assessment - 12/10/16 0001      Standardized Balance Assessment   Standardized Balance Assessment Berg Balance Test     Berg Balance Test   Sit to Stand Able to stand without using hands and stabilize independently   Standing Unsupported Able to stand safely 2 minutes   Sitting with Back Unsupported but Feet Supported on Floor or Stool Able to sit safely and securely 2 minutes   Stand to Sit Sits safely with minimal use of hands   Transfers Able to transfer safely, minor use of hands   Standing Unsupported with Eyes Closed Able to stand 10 seconds safely   Standing Ubsupported with Feet Together Able to place feet together independently and stand 1 minute safely   From  Standing, Reach Forward with Outstretched Arm Can reach forward >12 cm safely (5")   From Standing Position, Pick up Object from Floor Able to pick up shoe, needs supervision   From Standing Position, Turn to Look Behind Over each Shoulder Looks behind one side only/other side shows less weight shift   Turn 360 Degrees Able to turn 360 degrees safely but slowly   Standing Unsupported, Alternately Place Feet on Step/Stool Able to complete >2 steps/needs minimal assist   Standing Unsupported, One Foot in Front Able to take small step independently and hold 30 seconds  8 inch step 1 HHA , able to do 4 inch with supervision   Standing on One Leg Unable to try or needs assist to prevent fall   Total Score 42   Berg comment: 42/56     Dynamic Gait Index   Level Surface Mild Impairment   Change in Gait Speed Mild Impairment   Gait with Horizontal Head Turns Moderate Impairment   Gait with  Vertical Head Turns Moderate Impairment                     [facility] Adult PT Treatment/Exercise - 12/10/16 0001      Neck Exercises: Seated   Neck Retraction 10 reps;5 secs   Postural Training seated used towel roll in lumbar and step under feet to promote good alignment    Other Seated Exercise ER and IR yellow x 10 and yellow horiztonal pull, arms <90 deg due to L UE weakness (old fx)    Other Seated Exercise scapular retraction x 15 reps      Moist Heat Therapy   Number Minutes Moist Heat 10 Minutes   Moist Heat Location Cervical                PT Education - 12/10/16 1424    Education provided Yes   Education Details balance and gait, chin tuck    Person(s) Educated Patient   Methods Explanation   Comprehension Verbalized understanding;Returned demonstration;Need further instruction          PT Short Term Goals - 12/10/16 1425      PT SHORT TERM GOAL #1   Title Pt will complete balance assessment and understand results, set goal    Status Achieved     PT SHORT TERM GOAL #2   Title Pt will be I with simple HEP for cervical spine, UE strength an postuure    Status On-going     PT SHORT TERM GOAL #3   Title Pt will report increased ability to stand from a sitting position and transfer in and out of the bed (25% improved)    Status On-going           PT Long Term Goals - 12/10/16 1425      PT LONG TERM GOAL #1   Title Pt will be able to walk in the community as needed with only minimal increase in neck pain.    Status On-going     PT LONG TERM GOAL #2   Title Pt will safely negotiate 12 or more stairs in the community with confidence and min to no fatigue.    Status On-going     PT LONG TERM GOAL #3   Title Pt will improve Berg balance score by 6 points 48/56.    Status New     PT LONG TERM GOAL #4   Title Pt will increase cervical AROM by 10 or more degrees  in each direction, no lasting pain for improved driving, travel.    Status  On-going               Plan - 12/10/16 1433    Clinical Impression Statement Patient with fall risk due to ataxia and LE stiffness, weakness.  Scored 42/56 on Berg.  Did not complete DGI due to poor baseline but did work on head turns and gait speed changes.  Without her cane she takes very small steps, faster speeds actually improve smooth pattern but only briefly.     PT Next Visit Plan gait/balance  assessment (s), HEP (C-AROM) , try manual to cervicals , add to HEP    PT Home Exercise Plan C AROM gentle , scap retraction    Consulted and Agree with Plan of Care Patient      Patient will benefit from skilled therapeutic intervention in order to improve the following deficits and impairments:  Abnormal gait, Decreased endurance, Hypomobility, Impaired sensation, Obesity, Increased edema, Decreased activity tolerance, Decreased strength, Increased fascial restricitons, Impaired UE functional use, Pain, Difficulty walking, Decreased mobility, Decreased balance, Decreased range of motion, Improper body mechanics, Postural dysfunction, Impaired flexibility, Decreased coordination, Decreased safety awareness  Visit Diagnosis: Cervicalgia  Other abnormalities of gait and mobility  Muscle weakness (generalized)     Problem List Patient Active Problem List   Diagnosis Date Noted  . Cervical stenosis of spinal canal 10/23/2016  . Neck swelling 09/28/2011  . Weight gain 01/02/2011  . Carpal tunnel syndrome 11/02/2010  . Leg swelling 08/26/2010  . ADRENAL MASS, LEFT 05/23/2010  . SPINAL STENOSIS, LUMBAR 12/27/2009  . B12 DEFICIENCY 06/10/2009  . ANEMIA 06/10/2009  . BACK PAIN, LUMBAR 11/06/2008  . KNEE PAIN, LEFT 02/03/2008  . DIABETES MELLITUS, TYPE II, UNCONTROLLED 01/12/2008  . MENOPAUSE-RELATED VASOMOTOR SYMPTOMS, HOT FLASHES 06/23/2007  . BURSITIS, LEFT HIP 06/23/2007  . HYPERLIPIDEMIA 03/25/2007  . ANXIETY 03/25/2007  . DEPRESSION 03/25/2007  . PERIPHERAL NEUROPATHY  03/25/2007  . HYPERTENSION 03/25/2007  . CORONARY ARTERY DISEASE 03/25/2007  . DIVERTICULOSIS, COLON 03/25/2007  . TUBULOVILLOUS ADENOMA, COLON 02/18/2007    Archit Leger 12/10/2016, 2:36 PM  [facility] [address] [place], [name], [address] Phone: [phone]   Fax:  [phone]  Name: [name] Bean MRN: [id number] Date of Birth: 1951-01-06  Raeford Razor, PT 12/10/16 2:36 PM Phone: [phone] Fax: [phone]

## 2016-12-16 ENCOUNTER — Ambulatory Visit: Payer: BC Managed Care – PPO | Attending: Neurosurgery | Admitting: Physical Therapy

## 2016-12-16 DIAGNOSIS — R2689 Other abnormalities of gait and mobility: Secondary | ICD-10-CM

## 2016-12-16 DIAGNOSIS — M542 Cervicalgia: Secondary | ICD-10-CM | POA: Insufficient documentation

## 2016-12-16 DIAGNOSIS — M6281 Muscle weakness (generalized): Secondary | ICD-10-CM | POA: Insufficient documentation

## 2016-12-16 NOTE — Patient Instructions (Signed)
Over Head Pull: Narrow Grip       On back, knees bent, feet flat, band across thighs, elbows straight but relaxed. Pull hands apart (start). Keeping elbows straight, bring arms up and over head, hands toward floor. Keep pull steady on band. Hold momentarily. Return slowly, keeping pull steady, back to start. Repeat __10_ times. Band color ___yellow___   Side Pull: Double Arm   On back, knees bent, feet flat. Arms perpendicular to body, shoulder level, elbows straight but relaxed. Pull arms out to sides, elbows straight. Resistance band comes across collarbones, hands toward floor. Hold momentarily. Slowly return to starting position. Repeat __10_ times. Band color _yellow____   Shoulder Rotation: Double Arm   On back, knees bent, feet flat, elbows tucked at sides, bent 90, hands palms up. Pull hands apart and down toward floor, keeping elbows near sides. Hold momentarily. Slowly return to starting position. Repeat __10_ times. Band color ___yellow ___   

## 2016-12-16 NOTE — Therapy (Signed)
[place], [name], [address] Phone: [phone]   Fax:  [phone]  Physical Therapy Treatment  Patient Details  Name: [name] Bean MRN: [id number] Date of Birth: November 17, 1950 Referring Provider: Dr. Ashok Pall   Encounter Date: 12/16/2016      PT End of Session - 12/16/16 1200    Visit Number 4   Number of Visits 12   PT Start Time 1102   PT Stop Time 1155   PT Time Calculation (min) 53 min   Activity Tolerance Patient tolerated treatment well   Behavior During Therapy [facility] for tasks assessed/performed      Past Medical History:  Diagnosis Date  . Anemia   . Anxiety   . Arthritis   . Asthma   . Broken arm    left arm  . Bronchitis   . Bursitis of left hip   . CAD (coronary artery disease)   . Candidiasis, vagina   . Cardiac arrest ([place]) 06/20/2016   at [facility]; ? due to flash pulm edema vs undertreated chronic CHF  . Cerumen impaction   . CHF (congestive heart failure) ([place])    RECENT ADMIT TO [facility]   . Colon, diverticulosis   . Depression   . Diabetes mellitus    15 YRS AGO  . Fatigue   . Gastroenteritis   . Hot flashes, menopausal   . Hyperlipidemia   . Knee pain, left   . Lumbar back pain   . Maxillary sinusitis    history  . Muscle tear    right gluteus  . Nausea   . Other B-complex deficiencies   . Otitis media, acute    left  . Peripheral neuropathy   . Spinal stenosis of lumbar region   . Tubulovillous adenoma of colon   . Unspecified essential hypertension     Past Surgical History:  Procedure Laterality Date  . CARDIAC CATHETERIZATION  2015, 2009, 2006  . CERVICAL SPINE SURGERY  2003  . CORONARY ANGIOPLASTY WITH STENT PLACEMENT  2002, 2015  . EYE SURGERY     cataracts bilaterally  . POSTERIOR CERVICAL LAMINECTOMY N/A 10/23/2016   Procedure: POSTERIOR CERVICAL LAMINECTOMY MULTI LEVEL CERVICAL TWO- CERVICAL THREE, CERVICAL THREE- CERVICAL FOUR;  Surgeon: Ashok Pall, MD;  Location: [place];  Service: Neurosurgery;  Laterality: N/A;  POSTERIOR    There were no vitals filed for this visit.      Subjective Assessment - 12/16/16 1111    Subjective Neck hurts more today.  Was at a party and had trouble leaving after I had sit for a long time.     Currently in Pain? Yes   Pain Score 3    Pain Location Neck   Pain Orientation Posterior   Pain Descriptors / Indicators Aching   Pain Type Chronic pain   Pain Onset More than a month ago   Pain Frequency Intermittent               [facility] Adult PT Treatment/Exercise - 12/16/16 0001      Neck Exercises: Machines for Strengthening   Other Machines for Strengthening Nustep L2 5 minutes UE/LE      Neck Exercises: Supine   Neck Retraction 10 reps   Cervical Rotation Both;5 reps   Cervical Rotation Limitations added in lower trunk rotation with head turns x 10    Shoulder Flexion 10 reps   Shoulder Flexion Weights (lbs) difficulty with LUE    Other Supine  Exercise chest press with magic circle and chin tuck x 10    Other Supine Exercise scapular stabilization: yellow band ER/IR , horizontal abd x 10 and flexion x 10      Moist Heat Therapy   Number Minutes Moist Heat 10 Minutes   Moist Heat Location Cervical     Manual Therapy   Manual Therapy Soft tissue mobilization;Myofascial release;Passive ROM   Soft tissue mobilization suboccipitals   Myofascial Release cervicals   Passive ROM rotation and sidelbending, gentle      Neck Exercises: Stretches   Upper Trapezius Stretch 2 reps;30 seconds   Other Neck Stretches cervical rotationX 3 each side                 PT Education - 12/16/16 1125    Education provided Yes   Education Details HEP supine scapular stabilization yellow band    Person(s) Educated Patient   Methods Explanation;Handout   Comprehension Verbalized understanding;Returned demonstration;Need further instruction          PT Short Term Goals - 12/10/16 1425       PT SHORT TERM GOAL #1   Title Pt will complete balance assessment and understand results, set goal    Status Achieved     PT SHORT TERM GOAL #2   Title Pt will be I with simple HEP for cervical spine, UE strength an postuure    Status On-going     PT SHORT TERM GOAL #3   Title Pt will report increased ability to stand from a sitting position and transfer in and out of the bed (25% improved)    Status On-going           PT Long Term Goals - 12/10/16 1425      PT LONG TERM GOAL #1   Title Pt will be able to walk in the community as needed with only minimal increase in neck pain.    Status On-going     PT LONG TERM GOAL #2   Title Pt will safely negotiate 12 or more stairs in the community with confidence and min to no fatigue.    Status On-going     PT LONG TERM GOAL #3   Title Pt will improve Berg balance score by 6 points 48/56.    Status New     PT LONG TERM GOAL #4   Title Pt will increase cervical AROM by 10 or more degrees in each direction, no lasting pain for improved driving, travel.    Status On-going               Plan - 12/16/16 1201    Clinical Impression Statement Pt with increased pain in neck today, which is not typical.  She reports doing well this weekend, was busy helping a friend at a party.  Was only limited because she was without her cane.  Reported relief of pain after session today, given stabilization exercises which she did in supine with rest breaks.    PT Next Visit Plan gait/balance    PT Home Exercise Plan C AROM gentle , scap retraction and supine scapular stablization    Consulted and Agree with Plan of Care Patient      Patient will benefit from skilled therapeutic intervention in order to improve the following deficits and impairments:  Abnormal gait, Decreased endurance, Hypomobility, Impaired sensation, Obesity, Increased edema, Decreased activity tolerance, Decreased strength, Increased fascial restricitons, Impaired UE  functional use, Pain, Difficulty walking, Decreased mobility, Decreased  balance, Decreased range of motion, Improper body mechanics, Postural dysfunction, Impaired flexibility, Decreased coordination, Decreased safety awareness  Visit Diagnosis: Cervicalgia  Other abnormalities of gait and mobility  Muscle weakness (generalized)     Problem List Patient Active Problem List   Diagnosis Date Noted  . Cervical stenosis of spinal canal 10/23/2016  . Neck swelling 09/28/2011  . Weight gain 01/02/2011  . Carpal tunnel syndrome 11/02/2010  . Leg swelling 08/26/2010  . ADRENAL MASS, LEFT 05/23/2010  . SPINAL STENOSIS, LUMBAR 12/27/2009  . B12 DEFICIENCY 06/10/2009  . ANEMIA 06/10/2009  . BACK PAIN, LUMBAR 11/06/2008  . KNEE PAIN, LEFT 02/03/2008  . DIABETES MELLITUS, TYPE II, UNCONTROLLED 01/12/2008  . MENOPAUSE-RELATED VASOMOTOR SYMPTOMS, HOT FLASHES 06/23/2007  . BURSITIS, LEFT HIP 06/23/2007  . HYPERLIPIDEMIA 03/25/2007  . ANXIETY 03/25/2007  . DEPRESSION 03/25/2007  . PERIPHERAL NEUROPATHY 03/25/2007  . HYPERTENSION 03/25/2007  . CORONARY ARTERY DISEASE 03/25/2007  . DIVERTICULOSIS, COLON 03/25/2007  . TUBULOVILLOUS ADENOMA, COLON 02/18/2007    Jacier Gladu 12/16/2016, 12:04 PM  [place] [facility] [address] [place], [name], [address] Phone: [phone]   Fax:  [phone]  Name: KATURAH KARAPETIAN MRN: [id number] Date of Birth: 1950-09-09   Raeford Razor, PT 12/16/16 12:04 PM Phone: [phone] Fax: [phone]

## 2016-12-17 ENCOUNTER — Ambulatory Visit: Payer: BC Managed Care – PPO | Admitting: Physical Therapy

## 2016-12-17 DIAGNOSIS — M542 Cervicalgia: Secondary | ICD-10-CM

## 2016-12-17 DIAGNOSIS — M6281 Muscle weakness (generalized): Secondary | ICD-10-CM

## 2016-12-17 DIAGNOSIS — R2689 Other abnormalities of gait and mobility: Secondary | ICD-10-CM

## 2016-12-17 NOTE — Therapy (Signed)
[place], [name], [address] Phone: [phone]   Fax:  [phone]  Physical Therapy Treatment  Patient Details  Name: [name] Bean MRN: [id number] Date of Birth: 06-07-1950 Referring Provider: Dr. Ashok Pall   Encounter Date: 12/17/2016      PT End of Session - 12/17/16 1152    Visit Number 5   Number of Visits 12   PT Start Time 1829   PT Stop Time 1200   PT Time Calculation (min) 55 min   Activity Tolerance Patient tolerated treatment well   Behavior During Therapy [facility] for tasks assessed/performed      Past Medical History:  Diagnosis Date  . Anemia   . Anxiety   . Arthritis   . Asthma   . Broken arm    left arm  . Bronchitis   . Bursitis of left hip   . CAD (coronary artery disease)   . Candidiasis, vagina   . Cardiac arrest ([place]) 06/20/2016   at [facility]; ? due to flash pulm edema vs undertreated chronic CHF  . Cerumen impaction   . CHF (congestive heart failure) ([place])    RECENT ADMIT TO [facility]   . Colon, diverticulosis   . Depression   . Diabetes mellitus    15 YRS AGO  . Fatigue   . Gastroenteritis   . Hot flashes, menopausal   . Hyperlipidemia   . Knee pain, left   . Lumbar back pain   . Maxillary sinusitis    history  . Muscle tear    right gluteus  . Nausea   . Other B-complex deficiencies   . Otitis media, acute    left  . Peripheral neuropathy   . Spinal stenosis of lumbar region   . Tubulovillous adenoma of colon   . Unspecified essential hypertension     Past Surgical History:  Procedure Laterality Date  . CARDIAC CATHETERIZATION  2015, 2009, 2006  . CERVICAL SPINE SURGERY  2003  . CORONARY ANGIOPLASTY WITH STENT PLACEMENT  2002, 2015  . EYE SURGERY     cataracts bilaterally  . POSTERIOR CERVICAL LAMINECTOMY N/A 10/23/2016   Procedure: POSTERIOR CERVICAL LAMINECTOMY MULTI LEVEL CERVICAL TWO- CERVICAL THREE, CERVICAL THREE- CERVICAL FOUR;  Surgeon: Ashok Pall, MD;  Location: [place];  Service: Neurosurgery;  Laterality: N/A;  POSTERIOR    There were no vitals filed for this visit.      Subjective Assessment - 12/17/16 1106    Subjective My neck felt good last time.  I almost called in sick today (tired).     Currently in Pain? No/denies  just stiff             [facility] Adult PT Treatment/Exercise - 12/17/16 0001      High Level Balance   High Level Balance Activities Side stepping;Backward walking;Direction changes;Turns;Tandem walking;Marching forwards;Marching backwards   High Level Balance Comments Airex marching (high) and mini squat x 10 with UE assist   worked in and out of parallel bars , no cane and min A      Shoulder Exercises: Standing   Horizontal ABduction Strengthening;Both;10 reps   Theraband Level (Shoulder Horizontal ABduction) Level 2 (Red)   Extension Strengthening;Both;12 reps   Theraband Level (Shoulder Extension) Level 3 (Green)   Extension Weight (lbs) 2 sets, 1 on foam    Row Strengthening;Both;12 reps   Theraband Level (Shoulder Row) Level 3 (Green)   Row Weight (lbs) 2 sets, 1 on foam  Moist Heat Therapy   Number Minutes Moist Heat 10 Minutes   Moist Heat Location Cervical                PT Education - 12/17/16 1151    Education provided Yes   Education Details balance and cues for gait    Person(s) Educated Patient   Methods Explanation   Comprehension Verbalized understanding          PT Short Term Goals - 12/17/16 1154      PT SHORT TERM GOAL #1   Title Pt will complete balance assessment and understand results, set goal    Status Achieved     PT SHORT TERM GOAL #2   Title Pt will be I with simple HEP for cervical spine, UE strength an postuure    Status Partially Met     PT SHORT TERM GOAL #3   Title Pt will report increased ability to stand from a sitting position and transfer in and out of the bed (25% improved)    Status On-going           PT Long Term  Goals - 12/10/16 1425      PT LONG TERM GOAL #1   Title Pt will be able to walk in the community as needed with only minimal increase in neck pain.    Status On-going     PT LONG TERM GOAL #2   Title Pt will safely negotiate 12 or more stairs in the community with confidence and min to no fatigue.    Status On-going     PT LONG TERM GOAL #3   Title Pt will improve Berg balance score by 6 points 48/56.    Status New     PT LONG TERM GOAL #4   Title Pt will increase cervical AROM by 10 or more degrees in each direction, no lasting pain for improved driving, travel.    Status On-going               Plan - 12/17/16 1152    Clinical Impression Statement Focused on integration of balance and posture today, using bands and Airex. Needed mod cues and min physical assist without use of cane for balance activites.  Fatigued, needed a few rest breaks during standing activities.    PT Next Visit Plan gait/balance , use parallel bars, obstacles and quick changes of direciton, head turns    PT Home Exercise Plan C AROM gentle , scap retraction and supine scapular stablization    Consulted and Agree with Plan of Care Patient      Patient will benefit from skilled therapeutic intervention in order to improve the following deficits and impairments:  Abnormal gait, Decreased endurance, Hypomobility, Impaired sensation, Obesity, Increased edema, Decreased activity tolerance, Decreased strength, Increased fascial restricitons, Impaired UE functional use, Pain, Difficulty walking, Decreased mobility, Decreased balance, Decreased range of motion, Improper body mechanics, Postural dysfunction, Impaired flexibility, Decreased coordination, Decreased safety awareness  Visit Diagnosis: Cervicalgia  Other abnormalities of gait and mobility  Muscle weakness (generalized)     Problem List Patient Active Problem List   Diagnosis Date Noted  . Cervical stenosis of spinal canal 10/23/2016  . Neck  swelling 09/28/2011  . Weight gain 01/02/2011  . Carpal tunnel syndrome 11/02/2010  . Leg swelling 08/26/2010  . ADRENAL MASS, LEFT 05/23/2010  . SPINAL STENOSIS, LUMBAR 12/27/2009  . B12 DEFICIENCY 06/10/2009  . ANEMIA 06/10/2009  . BACK PAIN, LUMBAR 11/06/2008  . KNEE PAIN,  LEFT 02/03/2008  . DIABETES MELLITUS, TYPE II, UNCONTROLLED 01/12/2008  . MENOPAUSE-RELATED VASOMOTOR SYMPTOMS, HOT FLASHES 06/23/2007  . BURSITIS, LEFT HIP 06/23/2007  . HYPERLIPIDEMIA 03/25/2007  . ANXIETY 03/25/2007  . DEPRESSION 03/25/2007  . PERIPHERAL NEUROPATHY 03/25/2007  . HYPERTENSION 03/25/2007  . CORONARY ARTERY DISEASE 03/25/2007  . DIVERTICULOSIS, COLON 03/25/2007  . TUBULOVILLOUS ADENOMA, COLON 02/18/2007    PAA,[name] Bean 12/17/2016, 11:55 AM  [facility] [address] [place], [name], [address] Phone: [phone]   Fax:  [phone]  Name: [name] Bean MRN: [id number] Date of Birth: 1950-10-01  Raeford Razor, PT 12/17/16 11:58 AM Phone: [phone] Fax: [phone]

## 2016-12-21 ENCOUNTER — Encounter: Payer: Self-pay | Admitting: Physical Therapy

## 2016-12-21 ENCOUNTER — Ambulatory Visit: Payer: BC Managed Care – PPO | Admitting: Physical Therapy

## 2016-12-21 DIAGNOSIS — R2689 Other abnormalities of gait and mobility: Secondary | ICD-10-CM

## 2016-12-21 DIAGNOSIS — M6281 Muscle weakness (generalized): Secondary | ICD-10-CM

## 2016-12-21 DIAGNOSIS — M542 Cervicalgia: Secondary | ICD-10-CM

## 2016-12-21 NOTE — Therapy (Signed)
[place], [name], [address] Phone: [phone]   Fax:  [phone]  Physical Therapy Treatment  Patient Details  Name: [name] Bean MRN: [id number] Date of Birth: 04-09-1951 Referring Provider: Dr. Ashok Pall   Encounter Date: 12/21/2016      PT End of Session - 12/21/16 1220    Visit Number 6   Number of Visits 12   PT Start Time 1110   PT Stop Time 1156   PT Time Calculation (min) 46 min   Activity Tolerance Patient tolerated treatment well   Behavior During Therapy [facility] for tasks assessed/performed      Past Medical History:  Diagnosis Date  . Anemia   . Anxiety   . Arthritis   . Asthma   . Broken arm    left arm  . Bronchitis   . Bursitis of left hip   . CAD (coronary artery disease)   . Candidiasis, vagina   . Cardiac arrest ([place]) 06/20/2016   at [facility]; ? due to flash pulm edema vs undertreated chronic CHF  . Cerumen impaction   . CHF (congestive heart failure) ([place])    RECENT ADMIT TO [facility]   . Colon, diverticulosis   . Depression   . Diabetes mellitus    15 YRS AGO  . Fatigue   . Gastroenteritis   . Hot flashes, menopausal   . Hyperlipidemia   . Knee pain, left   . Lumbar back pain   . Maxillary sinusitis    history  . Muscle tear    right gluteus  . Nausea   . Other B-complex deficiencies   . Otitis media, acute    left  . Peripheral neuropathy   . Spinal stenosis of lumbar region   . Tubulovillous adenoma of colon   . Unspecified essential hypertension     Past Surgical History:  Procedure Laterality Date  . CARDIAC CATHETERIZATION  2015, 2009, 2006  . CERVICAL SPINE SURGERY  2003  . CORONARY ANGIOPLASTY WITH STENT PLACEMENT  2002, 2015  . EYE SURGERY     cataracts bilaterally  . POSTERIOR CERVICAL LAMINECTOMY N/A 10/23/2016   Procedure: POSTERIOR CERVICAL LAMINECTOMY MULTI LEVEL CERVICAL TWO- CERVICAL THREE, CERVICAL THREE- CERVICAL FOUR;  Surgeon: Ashok Pall, MD;  Location: [place];  Service: Neurosurgery;  Laterality: N/A;  POSTERIOR    There were no vitals filed for this visit.      Subjective Assessment - 12/21/16 1116    Subjective I'm not bad but I'm not good.  PT is helping when I leave her I can walk on water! I go shopping, etc then it snaps right back on me.               [facility] Adult PT Treatment/Exercise - 12/21/16 0001      High Level Balance   High Level Balance Activities Backward walking;Tandem walking   High Level Balance Comments high knees, marching, toe taps, retro gait and tandem      Neuro Re-ed    Neuro Re-ed Details  heel raises with and without UE support, head turns slowly      Neck Exercises: Machines for Strengthening   Other Machines for Strengthening L5 UE and LE for 6 min level 5      Knee/Hip Exercises: Aerobic   Nustep 8 min L5 UE and LE   encouraged higher amplitude     Knee/Hip Exercises: Standing   Heel Raises 1 set;15 reps  Hip Flexion Stengthening;Both;1 set;15 reps   Hip Abduction Stengthening;Both;1 set;15 reps   Hip Extension Stengthening;Both;1 set;15 reps     Moist Heat Therapy   Number Minutes Moist Heat 10 Minutes   Moist Heat Location Cervical     Neck Exercises: Stretches   Upper Trapezius Stretch 2 reps;30 seconds   Other Neck Stretches cervical rotationX 3 each side                 PT Education - 12/21/16 1220    Education provided No          PT Short Term Goals - 12/17/16 1154      PT SHORT TERM GOAL #1   Title Pt will complete balance assessment and understand results, set goal    Status Achieved     PT SHORT TERM GOAL #2   Title Pt will be I with simple HEP for cervical spine, UE strength an postuure    Status Partially Met     PT SHORT TERM GOAL #3   Title Pt will report increased ability to stand from a sitting position and transfer in and out of the bed (25% improved)    Status On-going           PT Long Term Goals - 12/10/16 1425       PT LONG TERM GOAL #1   Title Pt will be able to walk in the community as needed with only minimal increase in neck pain.    Status On-going     PT LONG TERM GOAL #2   Title Pt will safely negotiate 12 or more stairs in the community with confidence and min to no fatigue.    Status On-going     PT LONG TERM GOAL #3   Title Pt will improve Berg balance score by 6 points 48/56.    Status New     PT LONG TERM GOAL #4   Title Pt will increase cervical AROM by 10 or more degrees in each direction, no lasting pain for improved driving, travel.    Status On-going               Plan - 12/21/16 1221    Clinical Impression Statement Pt needs cues for increased stride length, increasing hip and knee flexion. Very limited in sidebending and cervical rotation.  Sees MD 01/04/17.    PT Next Visit Plan gait/balance , use parallel bars, obstacles and quick changes of direciton, head turns    PT Home Exercise Plan C AROM gentle , scap retraction and supine scapular stablization    Consulted and Agree with Plan of Care Patient      Patient will benefit from skilled therapeutic intervention in order to improve the following deficits and impairments:  Abnormal gait, Decreased endurance, Hypomobility, Impaired sensation, Obesity, Increased edema, Decreased activity tolerance, Decreased strength, Increased fascial restricitons, Impaired UE functional use, Pain, Difficulty walking, Decreased mobility, Decreased balance, Decreased range of motion, Improper body mechanics, Postural dysfunction, Impaired flexibility, Decreased coordination, Decreased safety awareness  Visit Diagnosis: Cervicalgia  Other abnormalities of gait and mobility  Muscle weakness (generalized)     Problem List Patient Active Problem List   Diagnosis Date Noted  . Cervical stenosis of spinal canal 10/23/2016  . Neck swelling 09/28/2011  . Weight gain 01/02/2011  . Carpal tunnel syndrome 11/02/2010  . Leg swelling  08/26/2010  . ADRENAL MASS, LEFT 05/23/2010  . SPINAL STENOSIS, LUMBAR 12/27/2009  . B12 DEFICIENCY 06/10/2009  .   ANEMIA 06/10/2009  . BACK PAIN, LUMBAR 11/06/2008  . KNEE PAIN, LEFT 02/03/2008  . DIABETES MELLITUS, TYPE II, UNCONTROLLED 01/12/2008  . MENOPAUSE-RELATED VASOMOTOR SYMPTOMS, HOT FLASHES 06/23/2007  . BURSITIS, LEFT HIP 06/23/2007  . HYPERLIPIDEMIA 03/25/2007  . ANXIETY 03/25/2007  . DEPRESSION 03/25/2007  . PERIPHERAL NEUROPATHY 03/25/2007  . HYPERTENSION 03/25/2007  . CORONARY ARTERY DISEASE 03/25/2007  . DIVERTICULOSIS, COLON 03/25/2007  . TUBULOVILLOUS ADENOMA, COLON 02/18/2007    [name] Bean 12/21/2016, 1:18 PM  [facility] [address] [place], [name], [address] Phone: [phone]   Fax:  [phone]  Name: [name] Bean MRN: [id number] Date of Birth: [birth year]  Raeford Razor, PT 12/21/16 1:18 PM Phone: [phone] Fax: [phone]

## 2016-12-24 ENCOUNTER — Ambulatory Visit: Payer: BC Managed Care – PPO | Admitting: Physical Therapy

## 2016-12-24 DIAGNOSIS — R2689 Other abnormalities of gait and mobility: Secondary | ICD-10-CM

## 2016-12-24 DIAGNOSIS — M542 Cervicalgia: Secondary | ICD-10-CM | POA: Diagnosis not present

## 2016-12-24 DIAGNOSIS — M6281 Muscle weakness (generalized): Secondary | ICD-10-CM

## 2016-12-24 NOTE — Therapy (Signed)
[place], [name], [address] Phone: [phone]   Fax:  [phone]  Physical Therapy Treatment  Patient Details  Name: [name] Bean MRN: [id number] Date of Birth: 07-Oct-1950 Referring Provider: Dr. Ashok Pall   Encounter Date: 12/24/2016      PT End of Session - 12/24/16 1106    Visit Number 7   Number of Visits 12   PT Start Time 2800   PT Stop Time 1200   PT Time Calculation (min) 57 min      Past Medical History:  Diagnosis Date  . Anemia   . Anxiety   . Arthritis   . Asthma   . Broken arm    left arm  . Bronchitis   . Bursitis of left hip   . CAD (coronary artery disease)   . Candidiasis, vagina   . Cardiac arrest ([place]) 06/20/2016   at [facility]; ? due to flash pulm edema vs undertreated chronic CHF  . Cerumen impaction   . CHF (congestive heart failure) ([place])    RECENT ADMIT TO [facility]   . Colon, diverticulosis   . Depression   . Diabetes mellitus    15 YRS AGO  . Fatigue   . Gastroenteritis   . Hot flashes, menopausal   . Hyperlipidemia   . Knee pain, left   . Lumbar back pain   . Maxillary sinusitis    history  . Muscle tear    right gluteus  . Nausea   . Other B-complex deficiencies   . Otitis media, acute    left  . Peripheral neuropathy   . Spinal stenosis of lumbar region   . Tubulovillous adenoma of colon   . Unspecified essential hypertension     Past Surgical History:  Procedure Laterality Date  . CARDIAC CATHETERIZATION  2015, 2009, 2006  . CERVICAL SPINE SURGERY  2003  . CORONARY ANGIOPLASTY WITH STENT PLACEMENT  2002, 2015  . EYE SURGERY     cataracts bilaterally  . POSTERIOR CERVICAL LAMINECTOMY N/A 10/23/2016   Procedure: POSTERIOR CERVICAL LAMINECTOMY MULTI LEVEL CERVICAL TWO- CERVICAL THREE, CERVICAL THREE- CERVICAL FOUR;  Surgeon: Ashok Pall, MD;  Location: [place];  Service: Neurosurgery;  Laterality: N/A;  POSTERIOR    There were no vitals filed  for this visit.      Subjective Assessment - 12/24/16 1105    Subjective My calf is hurting a little.    Currently in Pain? No/denies   Pain Score 0-No pain   Pain Location Neck            [facility] PT Assessment - 12/24/16 0001      AROM   Cervical - Right Side Bend 20   Cervical - Left Side Bend 25   Cervical - Right Rotation 47   Cervical - Left Rotation 32                     [facility] Adult PT Treatment/Exercise - 12/24/16 0001      Neck Exercises: Supine   Other Supine Exercise chest press with cane, pullovers with cane to 90    Other Supine Exercise scapular stabilization: yellow band ER/IR , horizontal abd x 10 and flexion x 10      Knee/Hip Exercises: Aerobic   Nustep 6 min L5 UE and LE   encouraged higher amplitude     Knee/Hip Exercises: Standing   Heel Raises 1 set;15 reps   Hip Flexion Stengthening;Both;1  set;15 reps   Hip Abduction Stengthening;Both;1 set;15 reps   Hip Extension Stengthening;Both;1 set;15 reps   Other Standing Knee Exercises above ex done in foam pad for balance challenge      Shoulder Exercises: Standing   Extension Strengthening;Both;12 reps   Theraband Level (Shoulder Extension) Level 3 (Green)   Extension Weight (lbs) 2 sets, 1 on foam    Row Strengthening;Both;12 reps   Theraband Level (Shoulder Row) Level 3 (Green)   Row Weight (lbs) 2 sets, 1 on foam      Moist Heat Therapy   Number Minutes Moist Heat 15 Minutes   Moist Heat Location Cervical     Manual Therapy   Passive ROM rotation and sidelbending, gentle, flexion with rotation                   PT Short Term Goals - 12/24/16 1106      PT SHORT TERM GOAL #1   Title Pt will complete balance assessment and understand results, set goal    Status Achieved     PT SHORT TERM GOAL #2   Title Pt will be I with simple HEP for cervical spine, UE strength an postuure    Status Achieved     PT SHORT TERM GOAL #3   Title Pt will report increased ability to  stand from a sitting position and transfer in and out of the bed (25% improved)    Time 3   Period Weeks   Status Achieved           PT Long Term Goals - 12/24/16 1107      PT LONG TERM GOAL #1   Title Pt will be able to walk in the community as needed with only minimal increase in neck pain.    Baseline able to get around better, still using cart but not leaning on the cart, trying to stand up straight.    Time 6   Period Weeks   Status On-going     PT LONG TERM GOAL #2   Title Pt will safely negotiate 12 or more stairs in the community with confidence and min to no fatigue.    Time 6   Period Weeks   Status On-going     PT LONG TERM GOAL #3   Title Pt will improve Berg balance score by 6 points 48/56.    Time 6   Period Weeks   Status Unable to assess     PT LONG TERM GOAL #4   Title Pt will increase cervical AROM by 10 or more degrees in each direction, no lasting pain for improved driving, travel.    Baseline no difficulty driving, people are noticing I can turn my head better    Time 6   Period Weeks   Status Partially Met               Plan - 12/24/16 1129    Clinical Impression Statement Right cervical rotation improved and pt reports improved driving ability, no pain with driving. Continued PROM to neck and balance challenges while performing scapular exercises and hip strengthening in stanidng. LTG#4 met.    PT Next Visit Plan gait/balance , use parallel bars, obstacles and quick changes of direciton, head turns    PT Home Exercise Plan C AROM gentle , scap retraction and supine scapular stablization    Consulted and Agree with Plan of Care Patient      Patient will benefit from skilled therapeutic intervention  in order to improve the following deficits and impairments:  Abnormal gait, Decreased endurance, Hypomobility, Impaired sensation, Obesity, Increased edema, Decreased activity tolerance, Decreased strength, Increased fascial restricitons,  Impaired UE functional use, Pain, Difficulty walking, Decreased mobility, Decreased balance, Decreased range of motion, Improper body mechanics, Postural dysfunction, Impaired flexibility, Decreased coordination, Decreased safety awareness  Visit Diagnosis: Cervicalgia  Other abnormalities of gait and mobility  Muscle weakness (generalized)     Problem List Patient Active Problem List   Diagnosis Date Noted  . Cervical stenosis of spinal canal 10/23/2016  . Neck swelling 09/28/2011  . Weight gain 01/02/2011  . Carpal tunnel syndrome 11/02/2010  . Leg swelling 08/26/2010  . ADRENAL MASS, LEFT 05/23/2010  . SPINAL STENOSIS, LUMBAR 12/27/2009  . B12 DEFICIENCY 06/10/2009  . ANEMIA 06/10/2009  . BACK PAIN, LUMBAR 11/06/2008  . KNEE PAIN, LEFT 02/03/2008  . DIABETES MELLITUS, TYPE II, UNCONTROLLED 01/12/2008  . MENOPAUSE-RELATED VASOMOTOR SYMPTOMS, HOT FLASHES 06/23/2007  . BURSITIS, LEFT HIP 06/23/2007  . HYPERLIPIDEMIA 03/25/2007  . ANXIETY 03/25/2007  . DEPRESSION 03/25/2007  . PERIPHERAL NEUROPATHY 03/25/2007  . HYPERTENSION 03/25/2007  . CORONARY ARTERY DISEASE 03/25/2007  . DIVERTICULOSIS, COLON 03/25/2007  . TUBULOVILLOUS ADENOMA, COLON 02/18/2007    Dorene Ar, PTA 12/24/2016, 12:44 PM  [facility] [address] [place], [name], [address] Phone: [phone]   Fax:  [phone]  Name: [name] Bean MRN: [id number] Date of Birth: 1951/04/06

## 2016-12-24 NOTE — Therapy (Deleted)
[place], [name], [address] Phone: [phone]   Fax:  [phone]  December 24, 2016   '@[email or web address]'$ @  Physical Therapy Discharge Summary  Patient: [name] Bean  MRN: [id number]  Date of Birth: June 29, 1950   Diagnosis: Cervicalgia  Other abnormalities of gait and mobility  Muscle weakness (generalized) Referring Provider: Dr. Ashok Pall   The above patient had been seen in Physical Therapy *** times of *** treatments scheduled with *** no shows and *** cancellations.  The treatment consisted of *** The patient is: {improved/worse/unchanged:[id number]}  Subjective: ***  Discharge Findings: ***  Functional Status at Discharge: ***  {XVQMG:[id number]}      Plan - 12/24/16 1129    Clinical Impression Statement Right cervical rotation improved and pt reports improved driving ability, no pain with driving. Continued PROM to neck and balance challenges while performing scapular exercises and hip strengthening in stanidng. LTG#4 met.    PT Next Visit Plan gait/balance , use parallel bars, obstacles and quick changes of direciton, head turns    PT Home Exercise Plan C AROM gentle , scap retraction and supine scapular stablization    Consulted and Agree with Plan of Care Patient      Sincerely,   Hyacinth Meeker Dereck Leep, PTA   CC '@[email or web address]'$ @  [facility] [address] [place], [name], [address] Phone: [phone]   Fax:  [phone]  Patient: [name] Bean  MRN: [id number]  Date of Birth: 04/28/1950

## 2016-12-28 ENCOUNTER — Ambulatory Visit: Payer: BC Managed Care – PPO | Admitting: Physical Therapy

## 2016-12-28 DIAGNOSIS — M542 Cervicalgia: Secondary | ICD-10-CM

## 2016-12-28 DIAGNOSIS — M6281 Muscle weakness (generalized): Secondary | ICD-10-CM

## 2016-12-28 DIAGNOSIS — R2689 Other abnormalities of gait and mobility: Secondary | ICD-10-CM

## 2016-12-28 NOTE — Therapy (Signed)
[place], [name], [address] Phone: [phone]   Fax:  [phone]  Physical Therapy Treatment  Patient Details  Name: [name] Bean MRN: [id number] Date of Birth: Sep 08, 1950 Referring Provider: Dr. Ashok Pall   Encounter Date: 12/28/2016      PT End of Session - 12/28/16 1239    Visit Number 8   Number of Visits 12   Date for PT Re-Evaluation 01/12/17   PT Start Time 1147   PT Stop Time 1225   PT Time Calculation (min) 38 min      Past Medical History:  Diagnosis Date  . Anemia   . Anxiety   . Arthritis   . Asthma   . Broken arm    left arm  . Bronchitis   . Bursitis of left hip   . CAD (coronary artery disease)   . Candidiasis, vagina   . Cardiac arrest ([place]) 06/20/2016   at [facility]; ? due to flash pulm edema vs undertreated chronic CHF  . Cerumen impaction   . CHF (congestive heart failure) ([place])    RECENT ADMIT TO [facility]   . Colon, diverticulosis   . Depression   . Diabetes mellitus    15 YRS AGO  . Fatigue   . Gastroenteritis   . Hot flashes, menopausal   . Hyperlipidemia   . Knee pain, left   . Lumbar back pain   . Maxillary sinusitis    history  . Muscle tear    right gluteus  . Nausea   . Other B-complex deficiencies   . Otitis media, acute    left  . Peripheral neuropathy   . Spinal stenosis of lumbar region   . Tubulovillous adenoma of colon   . Unspecified essential hypertension     Past Surgical History:  Procedure Laterality Date  . CARDIAC CATHETERIZATION  2015, 2009, 2006  . CERVICAL SPINE SURGERY  2003  . CORONARY ANGIOPLASTY WITH STENT PLACEMENT  2002, 2015  . EYE SURGERY     cataracts bilaterally  . POSTERIOR CERVICAL LAMINECTOMY N/A 10/23/2016   Procedure: POSTERIOR CERVICAL LAMINECTOMY MULTI LEVEL CERVICAL TWO- CERVICAL THREE, CERVICAL THREE- CERVICAL FOUR;  Surgeon: Ashok Pall, MD;  Location: [place];  Service: Neurosurgery;  Laterality: N/A;   POSTERIOR    There were no vitals filed for this visit.      Subjective Assessment - 12/28/16 1153    Subjective my arm/ shoulder hurts. I get muscle cramps in both arms.    Currently in Pain? Yes   Pain Score 2    Pain Location Arm   Pain Descriptors / Indicators Aching   Aggravating Factors  barometric pressure   Pain Relieving Factors good posture, supported sitting             [facility] PT Assessment - 12/28/16 0001      Berg Balance Test   Sit to Stand Able to stand without using hands and stabilize independently   Standing Unsupported Able to stand safely 2 minutes   Sitting with Back Unsupported but Feet Supported on Floor or Stool Able to sit safely and securely 2 minutes   Stand to Sit Sits safely with minimal use of hands   Transfers Able to transfer safely, minor use of hands   Standing Unsupported with Eyes Closed Able to stand 10 seconds safely   Standing Ubsupported with Feet Together Able to place feet together independently and stand 1 minute safely   From  Standing, Reach Forward with Outstretched Arm Can reach forward >12 cm safely (5")   From Standing Position, Pick up Object from Floor Able to pick up shoe, needs supervision   From Standing Position, Turn to Look Behind Over each Shoulder Looks behind one side only/other side shows less weight shift  neck stiffness   Turn 360 Degrees Able to turn 360 degrees safely but slowly   Standing Unsupported, Alternately Place Feet on Step/Stool Able to stand independently and safely and complete 8 steps in 20 seconds   Standing Unsupported, One Foot in Front Able to plae foot ahead of the other independently and hold 30 seconds   Standing on One Leg Tries to lift leg/unable to hold 3 seconds but remains standing independently   Total Score 47   Berg comment: 47/56                     [facility] Adult PT Treatment/Exercise - 12/28/16 0001      Knee/Hip Exercises: Aerobic   Nustep 6 min L5 UE and LE       Knee/Hip Exercises: Standing   Forward Step Up 10 reps;Hand Hold: 1;Step Height: 6"   Gait Training up/ down stairs with 1 HR safely, uses UE more to assist RLE.      Knee/Hip Exercises: Seated   Sit to Sand 10 reps  with RLE back                   PT Short Term Goals - 12/24/16 1106      PT SHORT TERM GOAL #1   Title Pt will complete balance assessment and understand results, set goal    Status Achieved     PT SHORT TERM GOAL #2   Title Pt will be I with simple HEP for cervical spine, UE strength an postuure    Status Achieved     PT SHORT TERM GOAL #3   Title Pt will report increased ability to stand from a sitting position and transfer in and out of the bed (25% improved)    Time 3   Period Weeks   Status Achieved           PT Long Term Goals - 12/28/16 1243      PT LONG TERM GOAL #1   Title Pt will be able to walk in the community as needed with only minimal increase in neck pain.    Baseline able to get around better, still using cart but not leaning on the cart, trying to stand up straight, forgetting cane more often    Time 6   Period Weeks   Status On-going     PT LONG TERM GOAL #2   Title Pt will safely negotiate 12 or more stairs in the community with confidence and min to no fatigue.    Baseline safe but fatigues   Time 6   Period Weeks   Status Partially Met     PT LONG TERM GOAL #3   Title Pt will improve Berg balance score by 6 points 48/56.    Baseline 47/56   Time 6   Period Weeks   Status On-going     PT LONG TERM GOAL #4   Title Pt will increase cervical AROM by 10 or more degrees in each direction, no lasting pain for improved driving, travel.    Baseline no difficulty driving, people are noticing I can turn my head better    Time 6  Period Weeks   Status Partially Met               Plan - 12/28/16 1229    Clinical Impression Statement BERG improved to 47/56. Nearly met BERG goal. Safe on stairs with fatigue after  multiple step ups. Progressing toward stair goal.    PT Next Visit Plan gait/balance , use parallel bars, obstacles and quick changes of direciton, head turns ; step ups, neck/scap exercises   PT Home Exercise Plan C AROM gentle , scap retraction and supine scapular stablization    Consulted and Agree with Plan of Care Patient      Patient will benefit from skilled therapeutic intervention in order to improve the following deficits and impairments:  Abnormal gait, Decreased endurance, Hypomobility, Impaired sensation, Obesity, Increased edema, Decreased activity tolerance, Decreased strength, Increased fascial restricitons, Impaired UE functional use, Pain, Difficulty walking, Decreased mobility, Decreased balance, Decreased range of motion, Improper body mechanics, Postural dysfunction, Impaired flexibility, Decreased coordination, Decreased safety awareness  Visit Diagnosis: Cervicalgia  Other abnormalities of gait and mobility  Muscle weakness (generalized)     Problem List Patient Active Problem List   Diagnosis Date Noted  . Cervical stenosis of spinal canal 10/23/2016  . Neck swelling 09/28/2011  . Weight gain 01/02/2011  . Carpal tunnel syndrome 11/02/2010  . Leg swelling 08/26/2010  . ADRENAL MASS, LEFT 05/23/2010  . SPINAL STENOSIS, LUMBAR 12/27/2009  . B12 DEFICIENCY 06/10/2009  . ANEMIA 06/10/2009  . BACK PAIN, LUMBAR 11/06/2008  . KNEE PAIN, LEFT 02/03/2008  . DIABETES MELLITUS, TYPE II, UNCONTROLLED 01/12/2008  . MENOPAUSE-RELATED VASOMOTOR SYMPTOMS, HOT FLASHES 06/23/2007  . BURSITIS, LEFT HIP 06/23/2007  . HYPERLIPIDEMIA 03/25/2007  . ANXIETY 03/25/2007  . DEPRESSION 03/25/2007  . PERIPHERAL NEUROPATHY 03/25/2007  . HYPERTENSION 03/25/2007  . CORONARY ARTERY DISEASE 03/25/2007  . DIVERTICULOSIS, COLON 03/25/2007  . TUBULOVILLOUS ADENOMA, COLON 02/18/2007    Dorene Ar, PTA 12/28/2016, 12:45 PM  [facility] [address] [place], [name], [address] Phone: [phone]   Fax:  [phone]  Name: YENTL VERGE MRN: [id number] Date of Birth: December 20, 1950

## 2016-12-30 ENCOUNTER — Ambulatory Visit: Payer: BC Managed Care – PPO | Admitting: Physical Therapy

## 2016-12-30 DIAGNOSIS — R2689 Other abnormalities of gait and mobility: Secondary | ICD-10-CM

## 2016-12-30 DIAGNOSIS — M542 Cervicalgia: Secondary | ICD-10-CM

## 2016-12-30 DIAGNOSIS — M6281 Muscle weakness (generalized): Secondary | ICD-10-CM

## 2016-12-30 NOTE — Therapy (Signed)
[place], [name], [address] Phone: [phone]   Fax:  [phone]  Physical Therapy Treatment  Patient Details  Name: [name] Bean MRN: [id number] Date of Birth: 07-23-1950 Referring Provider: Dr. Ashok Pall   Encounter Date: 12/30/2016      PT End of Session - 12/30/16 1156    Visit Number 9   Number of Visits 12   Date for PT Re-Evaluation 01/12/17   PT Start Time 5885   PT Stop Time 1230   PT Time Calculation (min) 36 min   Activity Tolerance Patient tolerated treatment well   Behavior During Therapy [facility] for tasks assessed/performed      Past Medical History:  Diagnosis Date  . Anemia   . Anxiety   . Arthritis   . Asthma   . Broken arm    left arm  . Bronchitis   . Bursitis of left hip   . CAD (coronary artery disease)   . Candidiasis, vagina   . Cardiac arrest ([place]) 06/20/2016   at [facility]; ? due to flash pulm edema vs undertreated chronic CHF  . Cerumen impaction   . CHF (congestive heart failure) ([place])    RECENT ADMIT TO [facility]   . Colon, diverticulosis   . Depression   . Diabetes mellitus    15 YRS AGO  . Fatigue   . Gastroenteritis   . Hot flashes, menopausal   . Hyperlipidemia   . Knee pain, left   . Lumbar back pain   . Maxillary sinusitis    history  . Muscle tear    right gluteus  . Nausea   . Other B-complex deficiencies   . Otitis media, acute    left  . Peripheral neuropathy   . Spinal stenosis of lumbar region   . Tubulovillous adenoma of colon   . Unspecified essential hypertension     Past Surgical History:  Procedure Laterality Date  . CARDIAC CATHETERIZATION  2015, 2009, 2006  . CERVICAL SPINE SURGERY  2003  . CORONARY ANGIOPLASTY WITH STENT PLACEMENT  2002, 2015  . EYE SURGERY     cataracts bilaterally  . POSTERIOR CERVICAL LAMINECTOMY N/A 10/23/2016   Procedure: POSTERIOR CERVICAL LAMINECTOMY MULTI LEVEL CERVICAL TWO- CERVICAL THREE, CERVICAL  THREE- CERVICAL FOUR;  Surgeon: Ashok Pall, MD;  Location: [place];  Service: Neurosurgery;  Laterality: N/A;  POSTERIOR    There were no vitals filed for this visit.      Subjective Assessment - 12/30/16 1156    Subjective I was so tired after last visit.  Did step ups and my legs were burning! No pain today, legs are a little stiff.     Currently in Pain? No/denies             [facility] Adult PT Treatment/Exercise - 12/30/16 0001      Neck Exercises: Supine   Other Supine Exercise scapular stabilization: red band ER/IR , horizontal abd x 10 and flexion x 10      Lumbar Exercises: Stretches   Single Knee to Chest Stretch 2 reps   Lower Trunk Rotation 10 seconds   Lower Trunk Rotation Limitations x 10    Pelvic Tilt 5 reps;10 seconds     Lumbar Exercises: Supine   Clam 20 reps   Bent Knee Raise 10 reps   Bent Knee Raise Limitations 2 sets, 1 set without UE    Bridge 10 reps   Straight Leg Raise 10 reps  Large Ball Abdominal Isometric 10 reps     Knee/Hip Exercises: Aerobic   Nustep 6 min L5 UE and LE                   PT Short Term Goals - 12/24/16 1106      PT SHORT TERM GOAL #1   Title Pt will complete balance assessment and understand results, set goal    Status Achieved     PT SHORT TERM GOAL #2   Title Pt will be I with simple HEP for cervical spine, UE strength an postuure    Status Achieved     PT SHORT TERM GOAL #3   Title Pt will report increased ability to stand from a sitting position and transfer in and out of the bed (25% improved)    Time 3   Period Weeks   Status Achieved           PT Long Term Goals - 12/28/16 1243      PT LONG TERM GOAL #1   Title Pt will be able to walk in the community as needed with only minimal increase in neck pain.    Baseline able to get around better, still using cart but not leaning on the cart, trying to stand up straight, forgetting cane more often    Time 6   Period Weeks   Status On-going      PT LONG TERM GOAL #2   Title Pt will safely negotiate 12 or more stairs in the community with confidence and min to no fatigue.    Baseline safe but fatigues   Time 6   Period Weeks   Status Partially Met     PT LONG TERM GOAL #3   Title Pt will improve Berg balance score by 6 points 48/56.    Baseline 47/56   Time 6   Period Weeks   Status On-going     PT LONG TERM GOAL #4   Title Pt will increase cervical AROM by 10 or more degrees in each direction, no lasting pain for improved driving, travel.    Baseline no difficulty driving, people are noticing I can turn my head better    Time 6   Period Weeks   Status Partially Met               Plan - 12/30/16 1228    Clinical Impression Statement Patient admits to noncompliance with HEP but she has improved with balance and her ability to use her LUE. She sees MD next week, she will likely be renewed for more visits next week.     PT Next Visit Plan set goal for stairs, UE AROM , core and cont gait, balance    PT Home Exercise Plan C AROM gentle , scap retraction and supine scapular stablization    Consulted and Agree with Plan of Care Patient      Patient will benefit from skilled therapeutic intervention in order to improve the following deficits and impairments:  Abnormal gait, Decreased endurance, Hypomobility, Impaired sensation, Obesity, Increased edema, Decreased activity tolerance, Decreased strength, Increased fascial restricitons, Impaired UE functional use, Pain, Difficulty walking, Decreased mobility, Decreased balance, Decreased range of motion, Improper body mechanics, Postural dysfunction, Impaired flexibility, Decreased coordination, Decreased safety awareness  Visit Diagnosis: Cervicalgia  Other abnormalities of gait and mobility  Muscle weakness (generalized)     Problem List Patient Active Problem List   Diagnosis Date Noted  . Cervical stenosis of  spinal canal 10/23/2016  . Neck swelling 09/28/2011   . Weight gain 01/02/2011  . Carpal tunnel syndrome 11/02/2010  . Leg swelling 08/26/2010  . ADRENAL MASS, LEFT 05/23/2010  . SPINAL STENOSIS, LUMBAR 12/27/2009  . B12 DEFICIENCY 06/10/2009  . ANEMIA 06/10/2009  . BACK PAIN, LUMBAR 11/06/2008  . KNEE PAIN, LEFT 02/03/2008  . DIABETES MELLITUS, TYPE II, UNCONTROLLED 01/12/2008  . MENOPAUSE-RELATED VASOMOTOR SYMPTOMS, HOT FLASHES 06/23/2007  . BURSITIS, LEFT HIP 06/23/2007  . HYPERLIPIDEMIA 03/25/2007  . ANXIETY 03/25/2007  . DEPRESSION 03/25/2007  . PERIPHERAL NEUROPATHY 03/25/2007  . HYPERTENSION 03/25/2007  . CORONARY ARTERY DISEASE 03/25/2007  . DIVERTICULOSIS, COLON 03/25/2007  . TUBULOVILLOUS ADENOMA, COLON 02/18/2007    PAA,JENNIFER 12/30/2016, 12:49 PM  [facility] [address] [place], [place], [address] Phone: [phone]   Fax:  [phone]  Name: Federica E Kuzel MRN: [id number] Date of Birth: 11/20/1950  Jennifer Paa, PT 12/30/16 12:49 PM Phone: [phone] Fax: [phone]   

## 2017-01-04 ENCOUNTER — Ambulatory Visit: Payer: BC Managed Care – PPO | Admitting: Physical Therapy

## 2017-01-04 ENCOUNTER — Encounter: Payer: Self-pay | Admitting: Physical Therapy

## 2017-01-04 DIAGNOSIS — M542 Cervicalgia: Secondary | ICD-10-CM

## 2017-01-04 DIAGNOSIS — R2689 Other abnormalities of gait and mobility: Secondary | ICD-10-CM

## 2017-01-04 DIAGNOSIS — M6281 Muscle weakness (generalized): Secondary | ICD-10-CM

## 2017-01-04 NOTE — Therapy (Signed)
[place], [name], [address] Phone: [phone]   Fax:  [phone]  Physical Therapy Treatment/Renewal  Patient Details  Name: [name] Bean MRN: [id number] Date of Birth: [birth year] Referring Provider: Dr. Ashok Pall   Encounter Date: 01/04/2017      PT End of Session - 01/04/17 1215    Visit Number 10   Number of Visits 18   Date for PT Re-Evaluation 02/19/17   PT Start Time 1145   PT Stop Time 1230   PT Time Calculation (min) 45 min   Activity Tolerance Patient tolerated treatment well   Behavior During Therapy [facility] for tasks assessed/performed      Past Medical History:  Diagnosis Date  . Anemia   . Anxiety   . Arthritis   . Asthma   . Broken arm    left arm  . Bronchitis   . Bursitis of left hip   . CAD (coronary artery disease)   . Candidiasis, vagina   . Cardiac arrest ([place]) 06/20/2016   at [facility]; ? due to flash pulm edema vs undertreated chronic CHF  . Cerumen impaction   . CHF (congestive heart failure) ([place])    RECENT ADMIT TO [facility]   . Colon, diverticulosis   . Depression   . Diabetes mellitus    15 YRS AGO  . Fatigue   . Gastroenteritis   . Hot flashes, menopausal   . Hyperlipidemia   . Knee pain, left   . Lumbar back pain   . Maxillary sinusitis    history  . Muscle tear    right gluteus  . Nausea   . Other B-complex deficiencies   . Otitis media, acute    left  . Peripheral neuropathy   . Spinal stenosis of lumbar region   . Tubulovillous adenoma of colon   . Unspecified essential hypertension     Past Surgical History:  Procedure Laterality Date  . CARDIAC CATHETERIZATION  2015, 2009, 2006  . CERVICAL SPINE SURGERY  2003  . CORONARY ANGIOPLASTY WITH STENT PLACEMENT  2002, 2015  . EYE SURGERY     cataracts bilaterally  . POSTERIOR CERVICAL LAMINECTOMY N/A 10/23/2016   Procedure: POSTERIOR CERVICAL LAMINECTOMY MULTI LEVEL CERVICAL TWO- CERVICAL THREE,  CERVICAL THREE- CERVICAL FOUR;  Surgeon: Ashok Pall, MD;  Location: [place];  Service: Neurosurgery;  Laterality: N/A;  POSTERIOR    There were no vitals filed for this visit.      Subjective Assessment - 01/04/17 1158    Subjective Patient without neck pain today.  Has pain in hips, L calf with walking, .standing.   Pertinent History Ant disc 13 yrs ago , L arm fx, DM, HTN, knee arthritis L    Limitations Lifting;Standing;Walking;House hold activities   How long can you stand comfortably? 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     Patient Stated Goals Wants to regain mobility and strength.     Currently in Pain? No/denies            [facility] PT Assessment - 01/04/17 0001      AROM   Cervical - Right Rotation 55   Cervical - Left Rotation 50     Transfers   Five time sit to stand comments  19 sec           [facility] Adult PT Treatment/Exercise - 01/04/17 0001      Ambulation/Gait  Ambulation Distance (Feet) 200 Feet   Assistive device Small based quad cane   Gait Pattern Decreased arm swing - right;Decreased arm swing - left;Decreased step length - right;Decreased step length - left;Ataxic   Ambulation Surface Level;Indoor   Stairs Yes   Stairs Assistance 6: Modified independent (Device/Increase time)   Stair Management Technique Two rails;Alternating pattern   Number of Stairs 10  x 2    Height of Stairs 6  4      Lumbar Exercises: Stretches   Single Knee to Chest Stretch 3 reps   Lower Trunk Rotation 10 seconds   Lower Trunk Rotation Limitations x 10      Lumbar Exercises: Supine   Bridge 10 reps     Knee/Hip Exercises: Stretches   Hip Flexor Stretch Both;1 rep;Other (comment)   Hip Flexor Stretch Limitations about 3 min      Knee/Hip Exercises: Sidelying   Hip ABduction Strengthening;Both;1 set;10 reps   Other Sidelying Knee/Hip Exercises sidelying stretch for uppoer trunk rotation x 5  each side limited in Rt. UE      Shoulder Exercises: Pulleys   Flexion 3 minutes                PT Education - 01/04/17 1316    Education provided Yes   Education Details anterior hip stretch    Person(s) Educated Patient   Methods Explanation;Handout;Verbal cues;Tactile cues   Comprehension Verbalized understanding;Returned demonstration          PT Short Term Goals - 12/24/16 1106      PT SHORT TERM GOAL #1   Title Pt will complete balance assessment and understand results, set goal    Status Achieved     PT SHORT TERM GOAL #2   Title Pt will be I with simple HEP for cervical spine, UE strength an postuure    Status Achieved     PT SHORT TERM GOAL #3   Title Pt will report increased ability to stand from a sitting position and transfer in and out of the bed (25% improved)    Time 3   Period Weeks   Status Achieved           PT Long Term Goals - 01/04/17 1159      PT LONG TERM GOAL #1   Title Pt will be able to walk in the community as needed with only minimal increase in neck pain.    Baseline limited by L calf pain and both , does not walk that far , maybe 15-20 min depends   Status On-going     PT LONG TERM GOAL #2   Title Pt will safely negotiate 12 or more stairs in the community with confidence and min to no fatigue, pain in hips.    Baseline safe but fatigues   Time 6   Period Weeks   Status Partially Met     PT LONG TERM GOAL #3   Title Pt will improve Berg balance score by 6 points 48/56.    Status Partially Met     PT LONG TERM GOAL #4   Title Pt will increase cervical AROM by 10 or more degrees in each direction, no lasting pain for improved driving, travel.    Baseline met today, keep working to maintain   Status Partially Met     PT LONG TERM GOAL #5   Title Pt will be able to use her LUE for a small portion (25%) of her ADLs due to  increased ROM and less pain.    Time 6   Period Weeks   Status New   Target Date 02/19/17                Plan - 01/04/17 1220    Clinical Impression Statement Patient making functional gains with gait and mobility.  She would like to continue PT to maintain her mobility and improve stairs and UE function.    PT Frequency 2x / week   PT Duration 6 weeks   PT Treatment/Interventions ADLs/Self Care Home Management;Patient/family education;DME Instruction;Gait training;Stair training;Cryotherapy;Electrical Stimulation;Moist Heat;Ultrasound;Neuromuscular re-education;Passive range of motion;Balance training;Therapeutic exercise;Therapeutic activities;Manual techniques;Functional mobility training;Taping   PT Next Visit Plan set goal for stairs, UE AROM , core and cont gait, balance , renewed for 6 more weeks , try curb    PT Home Exercise Plan C AROM gentle , scap retraction and supine scapular stablization , bridging and anterior hip stretching    Consulted and Agree with Plan of Care Patient      Patient will benefit from skilled therapeutic intervention in order to improve the following deficits and impairments:  Abnormal gait, Decreased endurance, Hypomobility, Impaired sensation, Obesity, Increased edema, Decreased activity tolerance, Decreased strength, Increased fascial restricitons, Impaired UE functional use, Pain, Difficulty walking, Decreased mobility, Decreased balance, Decreased range of motion, Improper body mechanics, Postural dysfunction, Impaired flexibility, Decreased coordination, Decreased safety awareness  Visit Diagnosis: Cervicalgia  Other abnormalities of gait and mobility  Muscle weakness (generalized)     Problem List Patient Active Problem List   Diagnosis Date Noted  . Cervical stenosis of spinal canal 10/23/2016  . Neck swelling 09/28/2011  . Weight gain 01/02/2011  . Carpal tunnel syndrome 11/02/2010  . Leg swelling 08/26/2010  . ADRENAL MASS, LEFT 05/23/2010  . SPINAL STENOSIS, LUMBAR 12/27/2009  . B12 DEFICIENCY 06/10/2009  . ANEMIA  06/10/2009  . BACK PAIN, LUMBAR 11/06/2008  . KNEE PAIN, LEFT 02/03/2008  . DIABETES MELLITUS, TYPE II, UNCONTROLLED 01/12/2008  . MENOPAUSE-RELATED VASOMOTOR SYMPTOMS, HOT FLASHES 06/23/2007  . BURSITIS, LEFT HIP 06/23/2007  . HYPERLIPIDEMIA 03/25/2007  . ANXIETY 03/25/2007  . DEPRESSION 03/25/2007  . PERIPHERAL NEUROPATHY 03/25/2007  . HYPERTENSION 03/25/2007  . CORONARY ARTERY DISEASE 03/25/2007  . DIVERTICULOSIS, COLON 03/25/2007  . TUBULOVILLOUS ADENOMA, COLON 02/18/2007    Leahmarie Gasiorowski 01/04/2017, 1:18 PM  [facility] [address] [place], [name], [address] Phone: [phone]   Fax:  [phone]  Name: [name] Bean MRN: [id number] Date of Birth: [birth year]  Raeford Razor, PT 01/04/17 1:18 PM Phone: [phone] Fax: [phone]

## 2017-01-06 ENCOUNTER — Ambulatory Visit: Payer: BC Managed Care – PPO | Admitting: Physical Therapy

## 2017-01-06 DIAGNOSIS — R2689 Other abnormalities of gait and mobility: Secondary | ICD-10-CM

## 2017-01-06 DIAGNOSIS — M6281 Muscle weakness (generalized): Secondary | ICD-10-CM

## 2017-01-06 DIAGNOSIS — M542 Cervicalgia: Secondary | ICD-10-CM

## 2017-01-06 NOTE — Therapy (Signed)
[place], [name], [address] Phone: [phone]   Fax:  [phone]  Physical Therapy Treatment  Patient Details  Name: [name] Bean MRN: [id number] Date of Birth: 05-18-1950 Referring Provider: Dr. Ashok Pall   Encounter Date: 01/06/2017      PT End of Session - 01/06/17 1209    Visit Number 11   Number of Visits 18   Date for PT Re-Evaluation 02/19/17   PT Start Time 1147   PT Stop Time 1230   PT Time Calculation (min) 43 min      Past Medical History:  Diagnosis Date  . Anemia   . Anxiety   . Arthritis   . Asthma   . Broken arm    left arm  . Bronchitis   . Bursitis of left hip   . CAD (coronary artery disease)   . Candidiasis, vagina   . Cardiac arrest ([place]) 06/20/2016   at [facility]; ? due to flash pulm edema vs undertreated chronic CHF  . Cerumen impaction   . CHF (congestive heart failure) ([place])    RECENT ADMIT TO [facility]   . Colon, diverticulosis   . Depression   . Diabetes mellitus    15 YRS AGO  . Fatigue   . Gastroenteritis   . Hot flashes, menopausal   . Hyperlipidemia   . Knee pain, left   . Lumbar back pain   . Maxillary sinusitis    history  . Muscle tear    right gluteus  . Nausea   . Other B-complex deficiencies   . Otitis media, acute    left  . Peripheral neuropathy   . Spinal stenosis of lumbar region   . Tubulovillous adenoma of colon   . Unspecified essential hypertension     Past Surgical History:  Procedure Laterality Date  . CARDIAC CATHETERIZATION  2015, 2009, 2006  . CERVICAL SPINE SURGERY  2003  . CORONARY ANGIOPLASTY WITH STENT PLACEMENT  2002, 2015  . EYE SURGERY     cataracts bilaterally  . POSTERIOR CERVICAL LAMINECTOMY N/A 10/23/2016   Procedure: POSTERIOR CERVICAL LAMINECTOMY MULTI LEVEL CERVICAL TWO- CERVICAL THREE, CERVICAL THREE- CERVICAL FOUR;  Surgeon: Ashok Pall, MD;  Location: [place];  Service: Neurosurgery;  Laterality: N/A;   POSTERIOR    There were no vitals filed for this visit.      Subjective Assessment - 01/06/17 1209    Subjective My glutes hurt and my calf. I haven't had glute pain like this since the surgery in July    Currently in Pain? Yes   Pain Score 5    Pain Location Buttocks   Pain Orientation Left;Right   Pain Descriptors / Indicators Burning   Aggravating Factors  varies   Pain Relieving Factors wait for it to go away                          [facility] Adult PT Treatment/Exercise - 01/06/17 0001      Lumbar Exercises: Supine   Clam 20 reps   Bridge 10 reps     Knee/Hip Exercises: Stretches   Active Hamstring Stretch 2 reps;30 seconds   Active Hamstring Stretch Limitations strap   Hip Flexor Stretch Both;1 rep;Other (comment)   Hip Flexor Stretch Limitations about 3 min    Piriformis Stretch Limitations ER/IR modified from knee    Other Knee/Hip Stretches seated gastroc stretch with strap 3 x 30 each  Knee/Hip Exercises: Aerobic   Nustep 6 min L5 UE and LE      Knee/Hip Exercises: Standing   Forward Step Up 10 reps;Hand Hold: 1;Step Height: 6"   Gait Training able to climb stairs with 1 HR, needs 2 to descend       Knee/Hip Exercises: Sidelying   Hip ABduction Strengthening;Both;1 set;10 reps   Other Sidelying Knee/Hip Exercises sidelying stretch for uppoer trunk rotation x 5 each side limited in Rt. UE                   PT Short Term Goals - 12/24/16 1106      PT SHORT TERM GOAL #1   Title Pt will complete balance assessment and understand results, set goal    Status Achieved     PT SHORT TERM GOAL #2   Title Pt will be I with simple HEP for cervical spine, UE strength an postuure    Status Achieved     PT SHORT TERM GOAL #3   Title Pt will report increased ability to stand from a sitting position and transfer in and out of the bed (25% improved)    Time 3   Period Weeks   Status Achieved           PT Long Term Goals - 01/04/17  1159      PT LONG TERM GOAL #1   Title Pt will be able to walk in the community as needed with only minimal increase in neck pain.    Baseline limited by L calf pain and both , does not walk that far , maybe 15-20 min depends   Status On-going     PT LONG TERM GOAL #2   Title Pt will safely negotiate 12 or more stairs in the community with confidence and min to no fatigue, pain in hips.    Baseline safe but fatigues   Time 6   Period Weeks   Status Partially Met     PT LONG TERM GOAL #3   Title Pt will improve Berg balance score by 6 points 48/56.    Status Partially Met     PT LONG TERM GOAL #4   Title Pt will increase cervical AROM by 10 or more degrees in each direction, no lasting pain for improved driving, travel.    Baseline met today, keep working to maintain   Status Partially Met     PT LONG TERM GOAL #5   Title Pt will be able to use her LUE for a small portion (25%) of her ADLs due to increased ROM and less pain.    Time 6   Period Weeks   Status New   Target Date 02/19/17               Plan - 01/06/17 1243    Clinical Impression Statement Pt arrives complaining of Gluteal pain and calf pain. We worked on stretching her calves and gluteals as well as working on stairs and step ups. Passive left shoulder ROM as tolerated, very stiff.    PT Next Visit Plan set goal for stairs, UE AROM , core and cont gait, balance , renewed for 6 more weeks , try curb    PT Home Exercise Plan C AROM gentle , scap retraction and supine scapular stablization , bridging and anterior hip stretching    Consulted and Agree with Plan of Care Patient      Patient will benefit from skilled therapeutic intervention  in order to improve the following deficits and impairments:  Abnormal gait, Decreased endurance, Hypomobility, Impaired sensation, Obesity, Increased edema, Decreased activity tolerance, Decreased strength, Increased fascial restricitons, Impaired UE functional use, Pain,  Difficulty walking, Decreased mobility, Decreased balance, Decreased range of motion, Improper body mechanics, Postural dysfunction, Impaired flexibility, Decreased coordination, Decreased safety awareness  Visit Diagnosis: Cervicalgia  Other abnormalities of gait and mobility  Muscle weakness (generalized)     Problem List Patient Active Problem List   Diagnosis Date Noted  . Cervical stenosis of spinal canal 10/23/2016  . Neck swelling 09/28/2011  . Weight gain 01/02/2011  . Carpal tunnel syndrome 11/02/2010  . Leg swelling 08/26/2010  . ADRENAL MASS, LEFT 05/23/2010  . SPINAL STENOSIS, LUMBAR 12/27/2009  . B12 DEFICIENCY 06/10/2009  . ANEMIA 06/10/2009  . BACK PAIN, LUMBAR 11/06/2008  . KNEE PAIN, LEFT 02/03/2008  . DIABETES MELLITUS, TYPE II, UNCONTROLLED 01/12/2008  . MENOPAUSE-RELATED VASOMOTOR SYMPTOMS, HOT FLASHES 06/23/2007  . BURSITIS, LEFT HIP 06/23/2007  . HYPERLIPIDEMIA 03/25/2007  . ANXIETY 03/25/2007  . DEPRESSION 03/25/2007  . PERIPHERAL NEUROPATHY 03/25/2007  . HYPERTENSION 03/25/2007  . CORONARY ARTERY DISEASE 03/25/2007  . DIVERTICULOSIS, COLON 03/25/2007  . TUBULOVILLOUS ADENOMA, COLON 02/18/2007    Dorene Ar, PTA 01/06/2017, 12:59 PM  [facility] [address] [place], [name], [address] Phone: [phone]   Fax:  [phone]  Name: ELSEY HOLTS MRN: [id number] Date of Birth: June 16, 1950

## 2017-01-11 ENCOUNTER — Ambulatory Visit: Payer: BC Managed Care – PPO | Attending: Neurosurgery | Admitting: Physical Therapy

## 2017-01-11 DIAGNOSIS — M6281 Muscle weakness (generalized): Secondary | ICD-10-CM | POA: Insufficient documentation

## 2017-01-11 DIAGNOSIS — R2689 Other abnormalities of gait and mobility: Secondary | ICD-10-CM

## 2017-01-11 DIAGNOSIS — M542 Cervicalgia: Secondary | ICD-10-CM | POA: Insufficient documentation

## 2017-01-11 NOTE — Therapy (Signed)
[place], [name], [address] Phone: [phone]   Fax:  [phone]  Physical Therapy Treatment  Patient Details  Name: [name] Bean MRN: [id number] Date of Birth: [birth year] Referring Provider: Dr. Ashok Pall   Encounter Date: 01/11/2017      PT End of Session - 01/11/17 1247    Visit Number 12   Number of Visits 18   Date for PT Re-Evaluation 02/19/17   PT Start Time 4270   PT Stop Time 6237   PT Time Calculation (min) 50 min   Activity Tolerance Patient tolerated treatment well   Behavior During Therapy [facility] for tasks assessed/performed      Past Medical History:  Diagnosis Date  . Anemia   . Anxiety   . Arthritis   . Asthma   . Broken arm    left arm  . Bronchitis   . Bursitis of left hip   . CAD (coronary artery disease)   . Candidiasis, vagina   . Cardiac arrest ([place]) 06/20/2016   at [facility]; ? due to flash pulm edema vs undertreated chronic CHF  . Cerumen impaction   . CHF (congestive heart failure) ([place])    RECENT ADMIT TO [facility]   . Colon, diverticulosis   . Depression   . Diabetes mellitus    15 YRS AGO  . Fatigue   . Gastroenteritis   . Hot flashes, menopausal   . Hyperlipidemia   . Knee pain, left   . Lumbar back pain   . Maxillary sinusitis    history  . Muscle tear    right gluteus  . Nausea   . Other B-complex deficiencies   . Otitis media, acute    left  . Peripheral neuropathy   . Spinal stenosis of lumbar region   . Tubulovillous adenoma of colon   . Unspecified essential hypertension     Past Surgical History:  Procedure Laterality Date  . CARDIAC CATHETERIZATION  2015, 2009, 2006  . CERVICAL SPINE SURGERY  2003  . CORONARY ANGIOPLASTY WITH STENT PLACEMENT  2002, 2015  . EYE SURGERY     cataracts bilaterally  . POSTERIOR CERVICAL LAMINECTOMY N/A 10/23/2016   Procedure: POSTERIOR CERVICAL LAMINECTOMY MULTI LEVEL CERVICAL TWO- CERVICAL THREE, CERVICAL  THREE- CERVICAL FOUR;  Surgeon: Ashok Pall, MD;  Location: [place];  Service: Neurosurgery;  Laterality: N/A;  POSTERIOR    There were no vitals filed for this visit.      Subjective Assessment - 01/11/17 1154    Subjective No pain in calf but my glutes are still tight and painful. The stretching helped.     Currently in Pain? Yes   Pain Score 5    Pain Location Buttocks   Pain Orientation Right;Left   Pain Descriptors / Indicators Burning;Tightness   Pain Type Chronic pain   Pain Onset More than a month ago   Pain Frequency Intermittent            [facility] Adult PT Treatment/Exercise - 01/11/17 0001      Neck Exercises: Supine   Other Supine Exercise magic circle flexion x 10, chest press hands inner circle, ER isometric x 10      Lumbar Exercises: Stretches   Single Knee to Chest Stretch 3 reps;30 seconds   Piriformis Stretch 3 reps;30 seconds   Piriformis Stretch Limitations modified knee to opp shoulder      Knee/Hip Exercises: Stretches   Active Hamstring Stretch 2 reps;30 seconds  Active Hamstring Stretch Limitations strap   Piriformis Stretch Both;2 reps   Piriformis Stretch Limitations 30 sec seated figure 4      Shoulder Exercises: Standing   External Rotation Strengthening;Both;10 reps   Theraband Level (Shoulder External Rotation) Level 2 (Red)   Flexion Strengthening;Both;10 reps   Theraband Level (Shoulder Flexion) Level 1 (Yellow)   Extension Strengthening;Both;15 reps   Theraband Level (Shoulder Extension) Level 2 (Red)   Row Strengthening;Both;15 reps;Theraband   Theraband Level (Shoulder Row) Level 3 (Green)     Shoulder Exercises: Pulleys   Flexion 3 minutes     Manual Therapy   Passive ROM hip ER and IR passively for glute release  done in prone, quad stretching, pillow under hips for comfot                PT Education - 01/11/17 1247    Education provided Yes   Education Details glute stretching    Person(s) Educated Patient    Methods Explanation   Comprehension Returned demonstration;Verbalized understanding          PT Short Term Goals - 12/24/16 1106      PT SHORT TERM GOAL #1   Title Pt will complete balance assessment and understand results, set goal    Status Achieved     PT SHORT TERM GOAL #2   Title Pt will be I with simple HEP for cervical spine, UE strength an postuure    Status Achieved     PT SHORT TERM GOAL #3   Title Pt will report increased ability to stand from a sitting position and transfer in and out of the bed (25% improved)    Time 3   Period Weeks   Status Achieved           PT Long Term Goals - 01/04/17 1159      PT LONG TERM GOAL #1   Title Pt will be able to walk in the community as needed with only minimal increase in neck pain.    Baseline limited by L calf pain and both , does not walk that far , maybe 15-20 min depends   Status On-going     PT LONG TERM GOAL #2   Title Pt will safely negotiate 12 or more stairs in the community with confidence and min to no fatigue, pain in hips.    Baseline safe but fatigues   Time 6   Period Weeks   Status Partially Met     PT LONG TERM GOAL #3   Title Pt will improve Berg balance score by 6 points 48/56.    Status Partially Met     PT LONG TERM GOAL #4   Title Pt will increase cervical AROM by 10 or more degrees in each direction, no lasting pain for improved driving, travel.    Baseline met today, keep working to maintain   Status Partially Met     PT LONG TERM GOAL #5   Title Pt will be able to use her LUE for a small portion (25%) of her ADLs due to increased ROM and less pain.    Time 6   Period Weeks   Status New   Target Date 02/19/17               Plan - 01/11/17 1248    Clinical Impression Statement Addressed flexibility in hips and shoulders today.  Able to report less pain in glutes post session.  Very tight but can sit  with ankle over knee (figure 4) to stretch piriformis.    PT Next Visit Plan  cervical and UE AROM , core and cont gait, balance , ttry curb    PT Home Exercise Plan C AROM gentle , scap retraction and supine scapular stablization , bridging and anterior hip stretching    Consulted and Agree with Plan of Care Patient      Patient will benefit from skilled therapeutic intervention in order to improve the following deficits and impairments:  Abnormal gait, Decreased endurance, Hypomobility, Impaired sensation, Obesity, Increased edema, Decreased activity tolerance, Decreased strength, Increased fascial restricitons, Impaired UE functional use, Pain, Difficulty walking, Decreased mobility, Decreased balance, Decreased range of motion, Improper body mechanics, Postural dysfunction, Impaired flexibility, Decreased coordination, Decreased safety awareness  Visit Diagnosis: Cervicalgia  Other abnormalities of gait and mobility  Muscle weakness (generalized)     Problem List Patient Active Problem List   Diagnosis Date Noted  . Cervical stenosis of spinal canal 10/23/2016  . Neck swelling 09/28/2011  . Weight gain 01/02/2011  . Carpal tunnel syndrome 11/02/2010  . Leg swelling 08/26/2010  . ADRENAL MASS, LEFT 05/23/2010  . SPINAL STENOSIS, LUMBAR 12/27/2009  . B12 DEFICIENCY 06/10/2009  . ANEMIA 06/10/2009  . BACK PAIN, LUMBAR 11/06/2008  . KNEE PAIN, LEFT 02/03/2008  . DIABETES MELLITUS, TYPE II, UNCONTROLLED 01/12/2008  . MENOPAUSE-RELATED VASOMOTOR SYMPTOMS, HOT FLASHES 06/23/2007  . BURSITIS, LEFT HIP 06/23/2007  . HYPERLIPIDEMIA 03/25/2007  . ANXIETY 03/25/2007  . DEPRESSION 03/25/2007  . PERIPHERAL NEUROPATHY 03/25/2007  . HYPERTENSION 03/25/2007  . CORONARY ARTERY DISEASE 03/25/2007  . DIVERTICULOSIS, COLON 03/25/2007  . TUBULOVILLOUS ADENOMA, COLON 02/18/2007    Aixa Corsello 01/11/2017, 12:53 PM  [facility] [address] [place], [name], [address] Phone: [phone]   Fax:   [phone]  Name: NICKOLETTE ESPINOLA MRN: [id number] Date of Birth: [birth year]   Raeford Razor, PT 01/11/17 12:53 PM Phone: [phone] Fax: [phone]

## 2017-01-13 ENCOUNTER — Ambulatory Visit: Payer: BC Managed Care – PPO | Admitting: Physical Therapy

## 2017-01-18 ENCOUNTER — Encounter: Payer: Self-pay | Admitting: Physical Therapy

## 2017-01-18 ENCOUNTER — Encounter (HOSPITAL_COMMUNITY): Payer: BC Managed Care – PPO

## 2017-01-18 ENCOUNTER — Ambulatory Visit: Payer: BC Managed Care – PPO | Admitting: Physical Therapy

## 2017-01-18 DIAGNOSIS — M542 Cervicalgia: Secondary | ICD-10-CM

## 2017-01-18 DIAGNOSIS — R2689 Other abnormalities of gait and mobility: Secondary | ICD-10-CM

## 2017-01-18 DIAGNOSIS — M6281 Muscle weakness (generalized): Secondary | ICD-10-CM

## 2017-01-18 NOTE — Therapy (Addendum)
[place], [name], [address] Phone: [phone]   Fax:  [phone]  Physical Therapy Treatment  Patient Details  Name: [name] Bean MRN: [id number] Date of Birth: Nov 02, 1950 Referring Provider: Dr. Ashok Pall   Encounter Date: 01/18/2017      PT End of Session - 01/18/17 1241    Visit Number 13   Number of Visits 18   Date for PT Re-Evaluation 02/19/17   PT Start Time 1155   PT Stop Time 1250   PT Time Calculation (min) 55 min   Activity Tolerance Patient tolerated treatment well   Behavior During Therapy [facility] for tasks assessed/performed      Past Medical History:  Diagnosis Date  . Anemia   . Anxiety   . Arthritis   . Asthma   . Broken arm    left arm  . Bronchitis   . Bursitis of left hip   . CAD (coronary artery disease)   . Candidiasis, vagina   . Cardiac arrest ([place]) 06/20/2016   at [facility]; ? due to flash pulm edema vs undertreated chronic CHF  . Cerumen impaction   . CHF (congestive heart failure) ([place])    RECENT ADMIT TO [facility]   . Colon, diverticulosis   . Depression   . Diabetes mellitus    15 YRS AGO  . Fatigue   . Gastroenteritis   . Hot flashes, menopausal   . Hyperlipidemia   . Knee pain, left   . Lumbar back pain   . Maxillary sinusitis    history  . Muscle tear    right gluteus  . Nausea   . Other B-complex deficiencies   . Otitis media, acute    left  . Peripheral neuropathy   . Spinal stenosis of lumbar region   . Tubulovillous adenoma of colon   . Unspecified essential hypertension     Past Surgical History:  Procedure Laterality Date  . CARDIAC CATHETERIZATION  2015, 2009, 2006  . CERVICAL SPINE SURGERY  2003  . CORONARY ANGIOPLASTY WITH STENT PLACEMENT  2002, 2015  . EYE SURGERY     cataracts bilaterally  . POSTERIOR CERVICAL LAMINECTOMY N/A 10/23/2016   Procedure: POSTERIOR CERVICAL LAMINECTOMY MULTI LEVEL CERVICAL TWO- CERVICAL THREE, CERVICAL  THREE- CERVICAL FOUR;  Surgeon: Ashok Pall, MD;  Location: [place];  Service: Neurosurgery;  Laterality: N/A;  POSTERIOR    There were no vitals filed for this visit.      Subjective Assessment - 01/18/17 1300    Subjective I had a pain in my neck this weekend but I stretched it out. My glutes are not as clenched as they have been.  Going to [place] today to visit a friend.     Currently in Pain? Yes   Pain Score 2    Pain Location Buttocks   Pain Orientation Right;Left   Pain Descriptors / Indicators Tightness   Pain Type Chronic pain   Pain Onset More than a month ago   Pain Frequency Intermittent   Aggravating Factors  spontaneous   Pain Relieving Factors stretching, relaxing             [facility] Adult PT Treatment/Exercise - 01/18/17 0001      Lumbar Exercises: Stretches   Lower Trunk Rotation 10 seconds   Lower Trunk Rotation Limitations x 10    Piriformis Stretch 3 reps;30 seconds   Piriformis Stretch Limitations modified knee to opp shoulder      Lumbar  Exercises: Supine   Bridge 10 reps     Knee/Hip Exercises: Seated   Sit to Sand 20 reps;without UE support  added march x 5     Shoulder Exercises: Standing   Flexion Strengthening;Both;20 reps;Theraband   Theraband Level (Shoulder Flexion) Level 2 (Red)   Extension Strengthening;Both;20 reps   Theraband Level (Shoulder Extension) Level 3 (Green)   Row Strengthening;Both;20 reps;Theraband   Theraband Level (Shoulder Row) Level 3 (Green)   Other Standing Exercises UE Ranger standing with forward weight shift, diagonal reaching, very limited in L UE.      Moist Heat Therapy   Number Minutes Moist Heat 10 Minutes   Moist Heat Location Cervical     Manual Therapy   Soft tissue mobilization posterior cervicals    Passive ROM cervical spine lateral flexion and rotation                   PT Short Term Goals - 12/24/16 1106      PT SHORT TERM GOAL #1   Title Pt will complete balance assessment and  understand results, set goal    Status Achieved     PT SHORT TERM GOAL #2   Title Pt will be I with simple HEP for cervical spine, UE strength an postuure    Status Achieved     PT SHORT TERM GOAL #3   Title Pt will report increased ability to stand from a sitting position and transfer in and out of the bed (25% improved)    Time 3   Period Weeks   Status Achieved           PT Long Term Goals - 01/18/17 1248      PT LONG TERM GOAL #1   Title Pt will be able to walk in the community as needed with only minimal increase in neck pain.    Baseline limited by L calf pain and both , does not walk that far , maybe 15-20 min depends   Status On-going     PT LONG TERM GOAL #2   Title Pt will safely negotiate 12 or more stairs in the community with confidence and min to no fatigue, pain in hips.    Baseline safe but fatigues   Status Partially Met     PT LONG TERM GOAL #3   Title Pt will improve Berg balance score by 6 points 48/56.    Baseline 47/56   Status Partially Met     PT LONG TERM GOAL #4   Title Pt will increase cervical AROM by 10 or more degrees in each direction, no lasting pain for improved driving, travel.    Baseline NT but needs to maintain   Status Partially Met     PT LONG TERM GOAL #5   Title Pt will be able to use her LUE for a small portion (25%) of her ADLs due to increased ROM and less pain.    Status On-going               Plan - 01/18/17 1249    Clinical Impression Statement Pt was able to manage her neck pain at home this weekend.  She continues to have decreased confidence with walking, tightness hips, decreased disassociation of trunk and LE, decreased AROM in cervical spine and gait instability.  She is improving slowly, feels better after she has therapy for several hours.  She demonstrated more stability in standing today while working her upper body.  PT Next Visit Plan step ups, cervical and UE AROM , core and cont gait, balance , try  curb    PT Home Exercise Plan C AROM gentle , scap retraction and supine scapular stablization , bridging and anterior hip stretching    Consulted and Agree with Plan of Care Patient      Patient will benefit from skilled therapeutic intervention in order to improve the following deficits and impairments:  Abnormal gait, Decreased endurance, Hypomobility, Impaired sensation, Obesity, Increased edema, Decreased activity tolerance, Decreased strength, Increased fascial restricitons, Impaired UE functional use, Pain, Difficulty walking, Decreased mobility, Decreased balance, Decreased range of motion, Improper body mechanics, Postural dysfunction, Impaired flexibility, Decreased coordination, Decreased safety awareness  Visit Diagnosis: Cervicalgia  Other abnormalities of gait and mobility  Muscle weakness (generalized)     Problem List Patient Active Problem List   Diagnosis Date Noted  . Cervical stenosis of spinal canal 10/23/2016  . Neck swelling 09/28/2011  . Weight gain 01/02/2011  . Carpal tunnel syndrome 11/02/2010  . Leg swelling 08/26/2010  . ADRENAL MASS, LEFT 05/23/2010  . SPINAL STENOSIS, LUMBAR 12/27/2009  . B12 DEFICIENCY 06/10/2009  . ANEMIA 06/10/2009  . BACK PAIN, LUMBAR 11/06/2008  . KNEE PAIN, LEFT 02/03/2008  . DIABETES MELLITUS, TYPE II, UNCONTROLLED 01/12/2008  . MENOPAUSE-RELATED VASOMOTOR SYMPTOMS, HOT FLASHES 06/23/2007  . BURSITIS, LEFT HIP 06/23/2007  . HYPERLIPIDEMIA 03/25/2007  . ANXIETY 03/25/2007  . DEPRESSION 03/25/2007  . PERIPHERAL NEUROPATHY 03/25/2007  . HYPERTENSION 03/25/2007  . CORONARY ARTERY DISEASE 03/25/2007  . DIVERTICULOSIS, COLON 03/25/2007  . TUBULOVILLOUS ADENOMA, COLON 02/18/2007    PAA,JENNIFER 01/18/2017, 1:01 PM  [facility] [address] [place], [name], [address] Phone: [phone]   Fax:  [phone]  Name: MAILANI DEGROOTE MRN: [id number] Date of Birth:  [birth year]  Raeford Razor, PT 01/18/17 1:01 PM Phone: [phone] Fax: [phone]

## 2017-01-20 ENCOUNTER — Encounter: Payer: BC Managed Care – PPO | Admitting: Physical Therapy

## 2017-01-21 ENCOUNTER — Encounter (HOSPITAL_COMMUNITY): Payer: BC Managed Care – PPO

## 2017-01-25 ENCOUNTER — Encounter (HOSPITAL_COMMUNITY): Payer: BC Managed Care – PPO

## 2017-01-27 ENCOUNTER — Encounter: Payer: BC Managed Care – PPO | Admitting: Physical Therapy

## 2017-02-01 ENCOUNTER — Ambulatory Visit: Payer: BC Managed Care – PPO | Admitting: Physical Therapy

## 2017-02-01 ENCOUNTER — Encounter: Payer: Self-pay | Admitting: Physical Therapy

## 2017-02-01 DIAGNOSIS — M6281 Muscle weakness (generalized): Secondary | ICD-10-CM

## 2017-02-01 DIAGNOSIS — M542 Cervicalgia: Secondary | ICD-10-CM

## 2017-02-01 DIAGNOSIS — R2689 Other abnormalities of gait and mobility: Secondary | ICD-10-CM
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
## 2017-02-02 ENCOUNTER — Ambulatory Visit (HOSPITAL_COMMUNITY)
Admission: RE | Admit: 2017-02-02 | Discharge: 2017-02-02 | Disposition: A | Discharge status: Home / Self Care | Payer: BC Managed Care – PPO | Source: Ambulatory Visit | Attending: Cardiology | Admitting: Cardiology

## 2017-02-02 ENCOUNTER — Encounter (HOSPITAL_COMMUNITY): Payer: Self-pay

## 2017-02-02 VITALS — BP 122/60 | HR 87 | Wt 188.2 lb

## 2017-02-02 DIAGNOSIS — Z955 Presence of coronary angioplasty implant and graft: Secondary | ICD-10-CM | POA: Diagnosis not present

## 2017-02-02 DIAGNOSIS — F329 Major depressive disorder, single episode, unspecified: Secondary | ICD-10-CM | POA: Diagnosis not present

## 2017-02-02 DIAGNOSIS — Z79899 Other long term (current) drug therapy: Secondary | ICD-10-CM | POA: Diagnosis not present

## 2017-02-02 DIAGNOSIS — Z888 Allergy status to other drugs, medicaments and biological substances status: Secondary | ICD-10-CM | POA: Diagnosis not present

## 2017-02-02 DIAGNOSIS — F17200 Nicotine dependence, unspecified, uncomplicated: Secondary | ICD-10-CM

## 2017-02-02 DIAGNOSIS — Z8249 Family history of ischemic heart disease and other diseases of the circulatory system: Secondary | ICD-10-CM | POA: Diagnosis not present

## 2017-02-02 DIAGNOSIS — I10 Essential (primary) hypertension: Secondary | ICD-10-CM | POA: Diagnosis not present

## 2017-02-02 DIAGNOSIS — I2510 Atherosclerotic heart disease of native coronary artery without angina pectoris: Secondary | ICD-10-CM

## 2017-02-02 DIAGNOSIS — Z825 Family history of asthma and other chronic lower respiratory diseases: Secondary | ICD-10-CM | POA: Diagnosis not present

## 2017-02-02 DIAGNOSIS — Z811 Family history of alcohol abuse and dependence: Secondary | ICD-10-CM | POA: Diagnosis not present

## 2017-02-02 DIAGNOSIS — I5032 Chronic diastolic (congestive) heart failure: Secondary | ICD-10-CM

## 2017-02-02 DIAGNOSIS — F419 Anxiety disorder, unspecified: Secondary | ICD-10-CM | POA: Insufficient documentation

## 2017-02-02 DIAGNOSIS — E785 Hyperlipidemia, unspecified: Secondary | ICD-10-CM | POA: Insufficient documentation

## 2017-02-02 DIAGNOSIS — I110 Hypertensive heart disease with heart failure: Secondary | ICD-10-CM | POA: Diagnosis not present

## 2017-02-02 DIAGNOSIS — Z833 Family history of diabetes mellitus: Secondary | ICD-10-CM | POA: Diagnosis not present

## 2017-02-02 DIAGNOSIS — Z7982 Long term (current) use of aspirin: Secondary | ICD-10-CM | POA: Diagnosis not present

## 2017-02-02 DIAGNOSIS — Z8674 Personal history of sudden cardiac arrest: Secondary | ICD-10-CM | POA: Insufficient documentation

## 2017-02-02 DIAGNOSIS — Z79891 Long term (current) use of opiate analgesic: Secondary | ICD-10-CM | POA: Insufficient documentation

## 2017-02-02 DIAGNOSIS — J45909 Unspecified asthma, uncomplicated: Secondary | ICD-10-CM | POA: Insufficient documentation

## 2017-02-02 DIAGNOSIS — M4800 Spinal stenosis, site unspecified: Secondary | ICD-10-CM | POA: Diagnosis not present

## 2017-02-02 DIAGNOSIS — M48061 Spinal stenosis, lumbar region without neurogenic claudication: Secondary | ICD-10-CM | POA: Insufficient documentation

## 2017-02-02 DIAGNOSIS — F17210 Nicotine dependence, cigarettes, uncomplicated: Secondary | ICD-10-CM | POA: Diagnosis not present

## 2017-02-02 DIAGNOSIS — E1140 Type 2 diabetes mellitus with diabetic neuropathy, unspecified: Secondary | ICD-10-CM | POA: Diagnosis not present

## 2017-02-02 DIAGNOSIS — Z794 Long term (current) use of insulin: Secondary | ICD-10-CM | POA: Diagnosis not present

## 2017-02-02 LAB — BASIC METABOLIC PANEL
Anion gap: 12 (ref 5–15)
BUN: 17 mg/dL (ref 6–20)
CALCIUM: 9.2 mg/dL (ref 8.9–10.3)
CO2: 24 mmol/L (ref 22–32)
CREATININE: 0.91 mg/dL (ref 0.44–1.00)
Chloride: 99 mmol/L — ABNORMAL LOW (ref 101–111)
GFR calc Af Amer: >60 mL/min (ref >60)
GLUCOSE: 423 mg/dL — AB (ref 65–99)
POTASSIUM: 3.7 mmol/L (ref 3.5–5.1)
SODIUM: 135 mmol/L (ref 135–145)

## 2017-02-02 MED ORDER — VARENICLINE TARTRATE 1 MG PO TABS: 1.0000 mg | ORAL_TABLET | Freq: Two times a day (BID) | ORAL | 6 refills | Status: DC

## 2017-02-02 MED ORDER — VARENICLINE TARTRATE 0.5 MG X 11 & 1 MG X 42 PO MISC: ORAL | 0 refills | Status: DC

## 2017-02-02 NOTE — Progress Notes (Signed)
ADVANCED HF CLINIC CONSULT NOTE  Referring Physician: Sharren Bridge NP-C  Primary Care: Primary Cardiologist: Sharren Bridge   HPI: [name] is a 66 year old with a h/o cardiac arrest 2018, CAD S/P PCI Stent  to RCA 2002 and distal LAD 7989, chronic diastolic heart failure, HTN, Hyperlipidemia, spinal stenosis, tobacco use and DMII referred to HF clinic by Sharren Bridge NP at [facility] for HF follow up.   Admitted March 2018 to [facility] after cardiac arrest (PEA) thought to be from pulmonary edema .Ran out of lasix 2 weeks prior to admit. Initially intubated. Had strep pneumo in sputum. Ruled out for PE. No evidenced of ischemia. Diuresed with IV lasix transition to 60 mg torsemide daily. Discharge weight 191 pounds. Torsemide was later cut back to 20 mg daily at the office visit 08/03/2016.   Returns today for HF follow up. She feels great. Wants to start an exercise program. Denies SOB with exertion and with stairs. Denies orthopnea, PND, chest pain. Taking all medications. She wants to quit smoking.    CMRI 06/2016 1. The left ventricle is normal in cavity size and overall wall thickness. There is basal sigmoid septal hypertrophy. Global systolic function is normal. The LV ejection fraction is 65%. There is hypokinesia of the basal and mid inferior wall, and hypokinesia of the distal anterior wall. 2. The right ventricle is normal in cavity size, wall thickness, and systolic function. 3. The left atrium is mildly dilated, the right atrium is normal in size. 4. The aortic valve is trileaflet with no significant aortic valve stenosis or regurgitation. There is no significant valvular disease.  5. Delayed enhancement imaging demonstrates two areas with myocardial infarction: a. subendocardial (25% transmural extent) infarct in the mid and apical anterior wall (LAD territory). b. infarct in the basal to distal inferior wall (RCA territory), which is 75% transmural at the base, and subendocardial  (25% transmural extent) in the mid and distal segments. There is residual viability in all territories, except the basal inferior segment. 6. Regadenoson stress perfusion imaging demonstrates no evidence of inducible myocardial ischemia. 7. No LV thrombus seen. Review of Systems: [y] = yes, [ ]  = no   General: Weight gain [ ] ; Weight loss [ ] ; Anorexia [ ] ; Fatigue [ ] ; Fever [ ] ; Chills [ ] ; Weakness [ ]   Cardiac: Chest pain/pressure [ ] ; Resting SOB [ ] ; Exertional SOB [ ] ; Orthopnea [ ] ; Pedal Edema [ ] ; Palpitations [ ] ; Syncope [ ] ; Presyncope [ ] ; Paroxysmal nocturnal dyspnea[ ]   Pulmonary: Cough [ ] ; Wheezing[ ] ; Hemoptysis[ ] ; Sputum [ ] ; Snoring [ ]   GI: Vomiting[ ] ; Dysphagia[ ] ; Melena[ ] ; Hematochezia [ ] ; Heartburn[ ] ; Abdominal pain [ ] ; Constipation [ ] ; Diarrhea [ ] ; BRBPR [ ]   GU: Hematuria[ ] ; Dysuria [ ] ; Nocturia[ ]   Vascular: Pain in legs with walking [Y ]; Pain in feet with lying flat [ ] ; Non-healing sores [ ] ; Stroke [ ] ; TIA [ ] ; Slurred speech [ ] ;  Neuro: Headaches[ ] ; Vertigo[ ] ; Seizures[ ] ; Paresthesias[ ] ;Blurred vision [ ] ; Diplopia [ ] ; Vision changes [ ]   Ortho/Skin: Arthritis [ ] ; Joint pain [ Y]; Muscle pain [ ] ; Joint swelling [ ] ; Back Pain [ Y]; Rash [ ]   Psych: Depression[ ] ; Anxiety[ ]   Heme: Bleeding problems [ ] ; Clotting disorders [ ] ; Anemia [ ]   Endocrine: Diabetes [Y ]; Thyroid dysfunction[ ]    Past Medical History:  Diagnosis Date  . Anemia   .  Anxiety   . Arthritis   . Asthma   . Broken arm    left arm  . Bronchitis   . Bursitis of left hip   . CAD (coronary artery disease)   . Candidiasis, vagina   . Cardiac arrest ([place]) 06/20/2016   at [facility]; ? due to flash pulm edema vs undertreated chronic CHF  . Cerumen impaction   . CHF (congestive heart failure) ([place])    RECENT ADMIT TO [facility]   . Colon, diverticulosis   . Depression   . Diabetes mellitus    15 YRS AGO  . Fatigue   . Gastroenteritis   . Hot flashes, menopausal   .  Hyperlipidemia   . Knee pain, left   . Lumbar back pain   . Maxillary sinusitis    history  . Muscle tear    right gluteus  . Nausea   . Other B-complex deficiencies   . Otitis media, acute    left  . Peripheral neuropathy   . Spinal stenosis of lumbar region   . Tubulovillous adenoma of colon   . Unspecified essential hypertension     Current Outpatient Prescriptions  Medication Sig Dispense Refill  . aspirin EC 81 MG tablet Take 81 mg by mouth daily.     . BD PEN NEEDLE NANO U/F 32G X 4 MM MISC USE TO INJECT INSULIN 5 TIMES A DAY 100 each 5  . carvedilol (COREG) 25 MG tablet Take 1 tablet (25 mg total) by mouth 2 (two) times daily with a meal. 60 tablet 6  . escitalopram (LEXAPRO) 20 MG tablet Take 20 mg by mouth daily.    . hydrALAZINE (APRESOLINE) 50 MG tablet Take 50-100 mg by mouth See admin instructions. Take 100 mg by mouth in the morning and take 50 mg by mouth in the evening    . HYDROcodone-acetaminophen (NORCO/VICODIN) 5-325 MG tablet Take 1 tablet by mouth every 6 (six) hours as needed for moderate pain. 30 tablet 0  . insulin aspart (NOVOLOG) 100 UNIT/ML injection Inject 12 Units into the skin 3 (three) times daily before meals.    . insulin glargine (LANTUS) 100 UNIT/ML injection Inject 22 Units into the skin at bedtime.    [name] latanoprost (XALATAN) 0.005 % ophthalmic solution Place 1 drop into both eyes at bedtime.    [name] losartan (COZAAR) 100 MG tablet Take 100 mg by mouth daily.    . Melatonin 3 MG TABS Take 3 mg by mouth at bedtime as needed (sleep).    . rosuvastatin (CRESTOR) 20 MG tablet Take 20 mg by mouth daily.    [name] spironolactone (ALDACTONE) 25 MG tablet Take 25 mg by mouth daily.    [name] tiZANidine (ZANAFLEX) 4 MG tablet Take 1 tablet (4 mg total) by mouth every 6 (six) hours as needed for muscle spasms. 60 tablet 0  . torsemide (DEMADEX) 20 MG tablet Take 20-40 mg by mouth See admin instructions. Take 20 mg by mouth daily and may take 20-40 mg by mouth as needed  for swelling     No current facility-administered medications for this encounter.     Allergies  Allergen Reactions  . Sitagliptin Other (See Comments)    Had pancreatitis while on Januvia       Social History   Social History  . Marital status: Single    Spouse name: N/A  . Number of children: N/A  . Years of education: N/A   Occupational History  . SR DEV OFFICER  [employer]    [employer]   Social History Main Topics  . Smoking status: Current Every Day Smoker    Packs/day: 0.25    Years: 4.00  . Smokeless tobacco: Never Used     Comment: smokes two a day  . Alcohol use Yes     Comment: rarely  . Drug use: Yes    Types: Marijuana     Comment: about two years ago  . Sexual activity: Not on file   Other Topics Concern  . Not on file   Social History Narrative  . No narrative on file      Family History  Problem Relation Age of Onset  . Pneumonia Father        deceased  . Alcohol abuse Father   . Hypertension Mother   . Diabetes Mother     Vitals:   02/02/17 1009  BP: 122/60  Pulse: 87  SpO2: 98%  Weight: 188 lb 4 oz (85.4 kg)    PHYSICAL EXAM: General: Well appearing. No resp difficulty. HEENT: Normal Neck: Supple. JVP 5-6. Carotids 2+ bilat; no bruits. No thyromegaly or nodule noted. Scar noted on posterior neck from recent surgery.  Cor: PMI nondisplaced. RRR, No M/G/R noted Lungs: CTAB, normal effort. Abdomen: Soft, non-tender, non-distended, no HSM. No bruits or masses. +BS  Extremities: No cyanosis, clubbing, or rash. R and LLE no edema.  Neuro: Alert & orientedx3, cranial nerves grossly intact. moves all 4 extremities w/o difficulty. Affect pleasant   ASSESSMENT & PLAN:  1. Chronic Diastolic Heart Failure: cMRI EF 60-65%.  - NYHA I - Volume stable on exam. Continue torsemide 20 mg daily.  - Continue losartan 100 mg daily.  - Continue Spiro 25 mg daily.  - Continue Coreg 25 mg BID - Will refer to pulmonary rehab.   2. CAD- S/P  Stent RCA 2002 and LAD 2015 -stress cMRI at [facility] 3/18 with EF 60-65% no reversible ischemia - Denies chest pain. Continue statin and ASA.   3. HTN - Stable on current regimen.  4. Tobacco abuse - Patient requested Chantix, will provide Rx.   5. Spinal Stenosis - s/p cervical laminectomy.   Stable. BMET, CBC today.   Arbutus Leas, NP-C 10:19 AM

## 2017-02-02 NOTE — Patient Instructions (Signed)
START Chantix. Prescription has been sent to your pharmacy for your starter kit.  Follow instructions on package.  Will refer you to pulmonary rehab at [facility]. They will call you to set up initial appointment.  Follow up 6-8 weeks.  _________________________________________________________  [name] Bean Code:   Take all medication as prescribed the day of your appointment. Bring all medications with you to your appointment.  Do the following things EVERYDAY: 1) Weigh yourself in the morning before breakfast. Write it down and keep it in a log. 2) Take your medicines as prescribed 3) Eat low salt foods-Limit salt (sodium) to 2000 mg per day.  4) Stay as active as you can everyday 5) Limit all fluids for the day to less than 2 liters

## 2017-02-03 ENCOUNTER — Ambulatory Visit: Payer: BC Managed Care – PPO | Admitting: Physical Therapy

## 2017-02-03 DIAGNOSIS — M542 Cervicalgia: Secondary | ICD-10-CM | POA: Diagnosis not present

## 2017-02-03 DIAGNOSIS — R2689 Other abnormalities of gait and mobility: Secondary | ICD-10-CM

## 2017-02-03 DIAGNOSIS — M6281 Muscle weakness (generalized): Secondary | ICD-10-CM

## 2017-02-03 NOTE — Therapy (Signed)
[place], [name], [address] Phone: [phone]   Fax:  [phone]  Physical Therapy Treatment  Patient Details  Name: [name] Bean MRN: [id number] Date of Birth: 1950/06/21 Referring Provider: Dr. Ashok Pall   Encounter Date: 02/03/2017      PT End of Session - 02/03/17 1148    Visit Number 15   Number of Visits 18   Date for PT Re-Evaluation 02/19/17   PT Start Time 6568   PT Stop Time 1222   PT Time Calculation (min) 38 min      Past Medical History:  Diagnosis Date  . Anemia   . Anxiety   . Arthritis   . Asthma   . Broken arm    left arm  . Bronchitis   . Bursitis of left hip   . CAD (coronary artery disease)   . Candidiasis, vagina   . Cardiac arrest ([place]) 06/20/2016   at [facility]; ? due to flash pulm edema vs undertreated chronic CHF  . Cerumen impaction   . CHF (congestive heart failure) ([place])    [facility]ENT AD[facility] TO [facility]   . Colon, diverticulosis   . Depression   . Diabetes mellitus    15 YRS AGO  . Fatigue   . Gastroenteritis   . Hot flashes, menopausal   . Hyperlipidemia   . Knee pain, left   . Lumbar back pain   . Maxillary sinusitis    history  . Muscle tear    right gluteus  . Nausea   . Other B-complex deficiencies   . Otitis media, acute    left  . Peripheral neuropathy   . Spinal stenosis of lumbar region   . Tubulovillous adenoma of colon   . Unspecified essential hypertension     Past Surgical History:  Procedure Laterality Date  . CARDIAC CATHETERIZATION  2015, 2009, 2006  . CERVICAL SPINE SURGERY  2003  . CORONARY ANGIOPLASTY WITH STENT PLACEMENT  2002, 2015  . EYE SURGERY     cataracts bilaterally  . POSTERIOR CERVICAL LAMINECTOMY N/A 10/23/2016   Procedure: POSTERIOR CERVICAL LAMINECTOMY MULTI LEVEL CERVICAL TWO- CERVICAL THREE, CERVICAL THREE- CERVICAL FOUR;  Surgeon: Ashok Pall, MD;  Location: [place];  Service: Neurosurgery;  Laterality: N/A;   POSTERIOR    There were no vitals filed for this visit.      Subjective Assessment - 02/03/17 1148    Subjective I felt good for the rest of the day until night time after last visit.    Currently in Pain? No/denies                         [facility] Adult PT Treatment/Exercise - 02/03/17 0001      Lumbar Exercises: Supine   Other Supine Lumbar Exercises Reformer: foot work on heel toes, neutral and ER, bridge with ball, supine arms in strap red, pulldowns 5 times x 2      Knee/Hip Exercises: Stretches   Active Hamstring Stretch 2 reps;30 seconds   Active Hamstring Stretch Limitations strap                  PT Short Term Goals - 02/01/17 1543      PT SHORT TERM GOAL #1   Title Pt will complete balance assessment and understand results, set goal    Status Achieved     PT SHORT TERM GOAL #2   Title Pt will be I  with simple HEP for cervical spine, UE strength an postuure    Status Achieved     PT SHORT TERM GOAL #3   Title Pt will report increased ability to stand from a sitting position and transfer in and out of the bed (25% improved)    Status On-going           PT Long Term Goals - 02/01/17 1543      PT LONG TERM GOAL #1   Title Pt will be able to walk in the community as needed with only minimal increase in neck pain.    Status On-going     PT LONG TERM GOAL #2   Title Pt will safely negotiate 12 or more stairs in the community with confidence and min to no fatigue, pain in hips.    Status On-going     PT LONG TERM GOAL #3   Title Pt will improve Berg balance score by 6 points 48/56.    Status Partially Met     PT LONG TERM GOAL #4   Title Pt will increase cervical AROM by 10 or more degrees in each direction, no lasting pain for improved driving, travel.    Status Partially Met     PT LONG TERM GOAL #5   Title Pt will be able to use her LUE for a small portion (25%) of her ADLs due to increased ROM and less pain.    Status  On-going               Plan - 02/03/17 1155    Clinical Impression Statement Pt reports no neck pain today. Soft tissue work was helpful last time. Pain is aggravated by crochet work and pt knows she needs to adjust posture with crocheting. Trial of reformer today with good tolerane. She would benefit from [facility] trial as well.    PT Next Visit Plan Reformer - try tower    PT Home Exercise Plan C AROM gentle , scap retraction and supine scapular stablization , bridging and anterior hip stretching    Consulted and Agree with Plan of Care Patient      Patient will benefit from skilled therapeutic intervention in order to improve the following deficits and impairments:  Abnormal gait, Decreased endurance, Hypomobility, Impaired sensation, Obesity, Increased edema, Decreased activity tolerance, Decreased strength, Increased fascial restricitons, Impaired UE functional use, Pain, Difficulty walking, Decreased mobility, Decreased balance, Decreased range of motion, Improper body mechanics, Postural dysfunction, Impaired flexibility, Decreased coordination, Decreased safety awareness  Visit Diagnosis: Cervicalgia  Other abnormalities of gait and mobility  Muscle weakness (generalized)     Problem List Patient Active Problem List   Diagnosis Date Noted  . Cervical stenosis of spinal canal 10/23/2016  . Neck swelling 09/28/2011  . Weight gain 01/02/2011  . Carpal tunnel syndrome 11/02/2010  . Leg swelling 08/26/2010  . ADRENAL MASS, LEFT 05/23/2010  . SPINAL STENOSIS, LUMBAR 12/27/2009  . B12 DEFICIENCY 06/10/2009  . ANEMIA 06/10/2009  . BACK PAIN, LUMBAR 11/06/2008  . KNEE PAIN, LEFT 02/03/2008  . DIABETES MELLITUS, TYPE II, UNCONTROLLED 01/12/2008  . MENOPAUSE-RELATED VASOMOTOR SYMPTOMS, HOT FLASHES 06/23/2007  . BURSITIS, LEFT HIP 06/23/2007  . HYPERLIPIDEMIA 03/25/2007  . ANXIETY 03/25/2007  . DEPRESSION 03/25/2007  . PERIPHERAL NEUROPATHY 03/25/2007  . HYPERTENSION  03/25/2007  . CORONARY ARTERY DISEASE 03/25/2007  . DIVERTICULOSIS, COLON 03/25/2007  . TUBULOVILLOUS ADENOMA, COLON 02/18/2007    Dorene Ar, PTA 02/03/2017, 12:22 PM  [facility]  [place], [name], [address] Phone: [phone]   Fax:  [phone]  Name: [name] Bean MRN: [id number] Date of Birth: [birth year]

## 2017-02-08 ENCOUNTER — Ambulatory Visit: Payer: BC Managed Care – PPO | Admitting: Physical Therapy

## 2017-02-10 ENCOUNTER — Ambulatory Visit: Payer: BC Managed Care – PPO | Admitting: Physical Therapy

## 2017-02-10 DIAGNOSIS — R2689 Other abnormalities of gait and mobility: Secondary | ICD-10-CM

## 2017-02-10 DIAGNOSIS — M6281 Muscle weakness (generalized): Secondary | ICD-10-CM

## 2017-02-10 DIAGNOSIS — M542 Cervicalgia: Secondary | ICD-10-CM | POA: Diagnosis not present

## 2017-02-10 NOTE — Therapy (Signed)
[place], [name], [address] Phone: [phone]   Fax:  [phone]  Physical Therapy Treatment  Patient Details  Name: [name] Bean MRN: [id number] Date of Birth: 04-19-1950 Referring Provider: Dr. Ashok Pall   Encounter Date: 02/10/2017      PT End of Session - 02/10/17 1204    Visit Number 16   Number of Visits 18   Date for PT Re-Evaluation 02/19/17   PT Start Time 1158  13 minutes late    PT Stop Time 1225   PT Time Calculation (min) 27 min      Past Medical History:  Diagnosis Date  . Anemia   . Anxiety   . Arthritis   . Asthma   . Broken arm    left arm  . Bronchitis   . Bursitis of left hip   . CAD (coronary artery disease)   . Candidiasis, vagina   . Cardiac arrest ([place]) 06/20/2016   at [facility]; ? due to flash pulm edema vs undertreated chronic CHF  . Cerumen impaction   . CHF (congestive heart failure) ([place])    RECENT ADMIT TO [facility]   . Colon, diverticulosis   . Depression   . Diabetes mellitus    15 YRS AGO  . Fatigue   . Gastroenteritis   . Hot flashes, menopausal   . Hyperlipidemia   . Knee pain, left   . Lumbar back pain   . Maxillary sinusitis    history  . Muscle tear    right gluteus  . Nausea   . Other B-complex deficiencies   . Otitis media, acute    left  . Peripheral neuropathy   . Spinal stenosis of lumbar region   . Tubulovillous adenoma of colon   . Unspecified essential hypertension     Past Surgical History:  Procedure Laterality Date  . CARDIAC CATHETERIZATION  2015, 2009, 2006  . CERVICAL SPINE SURGERY  2003  . CORONARY ANGIOPLASTY WITH STENT PLACEMENT  2002, 2015  . EYE SURGERY     cataracts bilaterally  . POSTERIOR CERVICAL LAMINECTOMY N/A 10/23/2016   Procedure: POSTERIOR CERVICAL LAMINECTOMY MULTI LEVEL CERVICAL TWO- CERVICAL THREE, CERVICAL THREE- CERVICAL FOUR;  Surgeon: Ashok Pall, MD;  Location: [facility];  Service: Neurosurgery;   Laterality: N/A;  POSTERIOR    There were no vitals filed for this visit.      Subjective Assessment - 02/10/17 1203    Subjective Sore in neck and shoulders after last visit.    Currently in Pain? No/denies                         [facility] Adult PT Treatment/Exercise - 02/10/17 0001      Lumbar Exercises: Supine   Other Supine Lumbar Exercises Reformer: foot work on heel toes, neutral and ER, bridge with ball, supine arms in strap red, pulldowns 5 times x 2 , feet in straps small ars and squatx 8 each -CGA                   PT Short Term Goals - 02/01/17 1543      PT SHORT TERM GOAL #1   Title Pt will complete balance assessment and understand results, set goal    Status Achieved     PT SHORT TERM GOAL #2   Title Pt will be I with simple HEP for cervical spine, UE strength an postuure  Status Achieved     PT SHORT TERM GOAL #3   Title Pt will report increased ability to stand from a sitting position and transfer in and out of the bed (25% improved)    Status On-going           PT Long Term Goals - 02/01/17 1543      PT LONG TERM GOAL #1   Title Pt will be able to walk in the community as needed with only minimal increase in neck pain.    Status On-going     PT LONG TERM GOAL #2   Title Pt will safely negotiate 12 or more stairs in the community with confidence and min to no fatigue, pain in hips.    Status On-going     PT LONG TERM GOAL #3   Title Pt will improve Berg balance score by 6 points 48/56.    Status Partially Met     PT LONG TERM GOAL #4   Title Pt will increase cervical AROM by 10 or more degrees in each direction, no lasting pain for improved driving, travel.    Status Partially Met     PT LONG TERM GOAL #5   Title Pt will be able to use her LUE for a small portion (25%) of her ADLs due to increased ROM and less pain.    Status On-going               Plan - 02/10/17 1239    Clinical Impression Statement 13  minutes late today. Continued reformer exercises to challange core stability. We progressed to feet in straps with good tolerance and she felt a good abdominal work out today.    PT Next Visit Plan Reformer - try tower , 3 more visits    PT Home Exercise Plan C AROM gentle , scap retraction and supine scapular stablization , bridging and anterior hip stretching    Consulted and Agree with Plan of Care Patient      Patient will benefit from skilled therapeutic intervention in order to improve the following deficits and impairments:     Visit Diagnosis: Cervicalgia  Other abnormalities of gait and mobility  Muscle weakness (generalized)     Problem List Patient Active Problem List   Diagnosis Date Noted  . Cervical stenosis of spinal canal 10/23/2016  . Neck swelling 09/28/2011  . Weight gain 01/02/2011  . Carpal tunnel syndrome 11/02/2010  . Leg swelling 08/26/2010  . ADRENAL MASS, LEFT 05/23/2010  . SPINAL STENOSIS, LUMBAR 12/27/2009  . B12 DEFICIENCY 06/10/2009  . ANEMIA 06/10/2009  . BACK PAIN, LUMBAR 11/06/2008  . KNEE PAIN, LEFT 02/03/2008  . DIABETES MELLITUS, TYPE II, UNCONTROLLED 01/12/2008  . MENOPAUSE-RELATED VASOMOTOR SYMPTOMS, HOT FLASHES 06/23/2007  . BURSITIS, LEFT HIP 06/23/2007  . HYPERLIPIDEMIA 03/25/2007  . ANXIETY 03/25/2007  . DEPRESSION 03/25/2007  . PERIPHERAL NEUROPATHY 03/25/2007  . HYPERTENSION 03/25/2007  . CORONARY ARTERY DISEASE 03/25/2007  . DIVERTICULOSIS, COLON 03/25/2007  . TUBULOVILLOUS ADENOMA, COLON 02/18/2007    Dorene Ar, PTA 02/10/2017, 12:45 PM  [facility] [address] [place], [name], [address] Phone: [phone]   Fax:  [phone]  Name: [name] Bean MRN: [id number] Date of Birth: [birth year]

## 2017-02-12 ENCOUNTER — Encounter: Payer: Self-pay | Admitting: Physical Therapy

## 2017-02-15 ENCOUNTER — Encounter: Payer: Self-pay | Admitting: Physical Therapy

## 2017-02-15 ENCOUNTER — Ambulatory Visit: Payer: BC Managed Care – PPO | Attending: Neurosurgery | Admitting: Physical Therapy

## 2017-02-15 DIAGNOSIS — M542 Cervicalgia: Secondary | ICD-10-CM | POA: Diagnosis not present

## 2017-02-15 DIAGNOSIS — M6281 Muscle weakness (generalized): Secondary | ICD-10-CM | POA: Diagnosis present

## 2017-02-15 DIAGNOSIS — R2689 Other abnormalities of gait and mobility: Secondary | ICD-10-CM | POA: Diagnosis present

## 2017-02-15 NOTE — Therapy (Signed)
[place], [name], [address] Phone: [phone]   Fax:  [phone]  Physical Therapy Treatment/Renewal  Patient Details  Name: [name] Bean MRN: [id number] Date of Birth: 1951/01/03 Referring Provider: Dr. Ashok Pall    Encounter Date: 02/15/2017  PT End of Session - 02/15/17 1205    Visit Number  17    Number of Visits  21    Date for PT Re-Evaluation  03/22/17    PT Start Time  1155    PT Stop Time  1234    PT Time Calculation (min)  39 min    Activity Tolerance  Patient tolerated treatment well    Behavior During Therapy  [facility] for tasks assessed/performed       Past Medical History:  Diagnosis Date  . Anemia   . Anxiety   . Arthritis   . Asthma   . Broken arm    left arm  . Bronchitis   . Bursitis of left hip   . CAD (coronary artery disease)   . Candidiasis, vagina   . Cardiac arrest ([place]) 06/20/2016   at [facility]; ? due to flash pulm edema vs undertreated chronic CHF  . Cerumen impaction   . CHF (congestive heart failure) ([place])    RECENT ADMIT TO [facility]   . Colon, diverticulosis   . Depression   . Diabetes mellitus    15 YRS AGO  . Fatigue   . Gastroenteritis   . Hot flashes, menopausal   . Hyperlipidemia   . Knee pain, left   . Lumbar back pain   . Maxillary sinusitis    history  . Muscle tear    right gluteus  . Nausea   . Other B-complex deficiencies   . Otitis media, acute    left  . Peripheral neuropathy   . Spinal stenosis of lumbar region   . Tubulovillous adenoma of colon   . Unspecified essential hypertension     Past Surgical History:  Procedure Laterality Date  . CARDIAC CATHETERIZATION  2015, 2009, 2006  . CERVICAL SPINE SURGERY  2003  . CORONARY ANGIOPLASTY WITH STENT PLACEMENT  2002, 2015  . EYE SURGERY     cataracts bilaterally    There were no vitals filed for this visit.  Subjective Assessment - 02/15/17 1158    Subjective  Are we doing the  Reformer?     Currently in Pain?  No/denies          Pilates Reformer used for LE/core strength, postural strength, lumbopelvic disassociation and core control.  Exercises included:  Footwork 2 Red 1 blue, used small soft ball for LE alignment and proper muscle engagement.  Various positions , needed manual A and cues for maintaining neutral spine   Supine Arm work:  1 Red Arcs x 10  Seated Arms: on long box   Row 1 Red   Bicep curl 1 Red   Extension Red    Horiz abd blue  Flexion  Blue x 10  hug a tree blue x 10  Exercises modified for proper spring tension , cues for upright posture , did 10-15 reps of each   No pain post.    PT Education - 02/15/17 1204    Education provided  Yes    Education Details  Pilates Reformer    Person(s) Educated  Patient    Methods  Explanation    Comprehension  Verbalized understanding;Need further instruction  PT Short Term Goals - 02/15/17 1256      PT SHORT TERM GOAL #1   Title  Pt will complete balance assessment and understand results, set goal     Status  Achieved      PT SHORT TERM GOAL #2   Title  Pt will be I with simple HEP for cervical spine, UE strength an postuure     Status  Achieved      PT SHORT TERM GOAL #3   Title  Pt will report increased ability to stand from a sitting position and transfer in and out of the bed (25% improved)     Status  Achieved        PT Long Term Goals - 02/15/17 1257      PT LONG TERM GOAL #1   Title  Pt will be able to walk in the community as needed with only minimal increase in neck pain.     Baseline  limited by L calf pain and both , does not walk that far , maybe 15-20 min depends    Status  On-going      PT LONG TERM GOAL #2   Title  Pt will safely negotiate 12 or more stairs in the community with confidence and min to no fatigue, pain in hips.     Status  On-going      PT LONG TERM GOAL #3   Title  Pt will improve Berg balance score by 6 points 48/56.     Status   Partially Met      PT LONG TERM GOAL #4   Title  Pt will increase cervical AROM by 10 or more degrees in each direction, no lasting pain for improved driving, travel.     Status  On-going      PT LONG TERM GOAL #5   Title  Pt will be able to use her LUE for a small portion (25%) of her ADLs due to increased ROM and less pain.     Status  On-going      PT LONG TERM GOAL #6   Title  Pt will transition to a community based fitness class/group.     Time  4    Period  Weeks    Status  New    Target Date  03/22/17            Plan - 02/15/17 1235    Clinical Impression Statement  Patient will benefir from 4 more visits to receive further conditioning, education regarding core, posture and Pilates with equipment.  She plans to come to the group for continued exercise and is also considering a group Pilates class for beginners.     PT Frequency  2x / week 4 more visits   4 more visits   PT Duration  4 weeks    PT Treatment/Interventions  ADLs/Self Care Home Management;Patient/family education;DME Instruction;Gait training;Stair training;Cryotherapy;Electrical Stimulation;Moist Heat;Ultrasound;Neuromuscular re-education;Passive range of motion;Balance training;Therapeutic exercise;Therapeutic activities;Manual techniques;Functional mobility training;Taping Pilates based for the last 4    Pilates based for the last 4    PT Next Visit Plan  Reformer - try tower     PT Home Exercise Plan  C AROM gentle , scap retraction and supine scapular stablization , bridging and anterior hip stretching     Consulted and Agree with Plan of Care  Patient       Patient will benefit from skilled therapeutic intervention in order to improve the following deficits and impairments:  Abnormal gait, Decreased endurance, Hypomobility, Impaired sensation, Obesity, Increased edema, Decreased activity tolerance, Decreased strength, Increased fascial restricitons, Impaired UE functional use, Pain, Difficulty walking,  Decreased mobility, Decreased balance, Decreased range of motion, Improper body mechanics, Postural dysfunction, Impaired flexibility, Decreased coordination, Decreased safety awareness  Visit Diagnosis: Cervicalgia  Other abnormalities of gait and mobility  Muscle weakness (generalized)     Problem List Patient Active Problem List   Diagnosis Date Noted  . Cervical stenosis of spinal canal 10/23/2016  . Neck swelling 09/28/2011  . Weight gain 01/02/2011  . Carpal tunnel syndrome 11/02/2010  . Leg swelling 08/26/2010  . ADRENAL MASS, LEFT 05/23/2010  . SPINAL STENOSIS, LUMBAR 12/27/2009  . B12 DEFICIENCY 06/10/2009  . ANEMIA 06/10/2009  . BACK PAIN, LUMBAR 11/06/2008  . KNEE PAIN, LEFT 02/03/2008  . DIABETES MELLITUS, TYPE II, UNCONTROLLED 01/12/2008  . MENOPAUSE-RELATED VASOMOTOR SYMPTOMS, HOT FLASHES 06/23/2007  . BURSITIS, LEFT HIP 06/23/2007  . HYPERLIPIDEMIA 03/25/2007  . ANXIETY 03/25/2007  . DEPRESSION 03/25/2007  . PERIPHERAL NEUROPATHY 03/25/2007  . HYPERTENSION 03/25/2007  . CORONARY ARTERY DISEASE 03/25/2007  . DIVERTICULOSIS, COLON 03/25/2007  . TUBULOVILLOUS ADENOMA, COLON 02/18/2007    Eathon Valade 02/15/2017, 12:59 PM  [facility] [address] [place], [name], [address] Phone: [phone]   Fax:  [phone]  Name: [name] Bean MRN: [id number] Date of Birth: 04-Aug-1950   Raeford Razor, PT 02/15/17 1:07 PM Phone: [phone] Fax: [phone]

## 2017-02-17 ENCOUNTER — Encounter: Payer: Self-pay | Admitting: Physical Therapy

## 2017-02-17 ENCOUNTER — Ambulatory Visit: Payer: BC Managed Care – PPO | Admitting: Physical Therapy

## 2017-02-17 DIAGNOSIS — M542 Cervicalgia: Secondary | ICD-10-CM

## 2017-02-17 DIAGNOSIS — R2689 Other abnormalities of gait and mobility: Secondary | ICD-10-CM

## 2017-02-17 DIAGNOSIS — M6281 Muscle weakness (generalized): Secondary | ICD-10-CM

## 2017-02-17 NOTE — Therapy (Signed)
[name], [name], [address] Phone: [phone]   Fax:  [phone]  Physical Therapy Treatment  Patient Details  Name: [name] Bean MRN: [id number] Date of Birth: 19-Nov-1950 Referring Provider: Dr. Ashok Pall    Encounter Date: 02/17/2017  PT End of Session - 02/17/17 1049    Visit Number  18    Number of Visits  21    Date for PT Re-Evaluation  03/22/17    PT Start Time  1018    PT Stop Time  1100    PT Time Calculation (min)  42 min       Past Medical History:  Diagnosis Date  . Anemia   . Anxiety   . Arthritis   . Asthma   . Broken arm    left arm  . Bronchitis   . Bursitis of left hip   . CAD (coronary artery disease)   . Candidiasis, vagina   . Cardiac arrest ([place]) 06/20/2016   at [facility]; ? due to flash pulm edema vs undertreated chronic CHF  . Cerumen impaction   . CHF (congestive heart failure) ([place])    RECENT ADMIT TO [facility]   . Colon, diverticulosis   . Depression   . Diabetes mellitus    15 YRS AGO  . Fatigue   . Gastroenteritis   . Hot flashes, menopausal   . Hyperlipidemia   . Knee pain, left   . Lumbar back pain   . Maxillary sinusitis    history  . Muscle tear    right gluteus  . Nausea   . Other B-complex deficiencies   . Otitis media, acute    left  . Peripheral neuropathy   . Spinal stenosis of lumbar region   . Tubulovillous adenoma of colon   . Unspecified essential hypertension     Past Surgical History:  Procedure Laterality Date  . CARDIAC CATHETERIZATION  2015, 2009, 2006  . CERVICAL SPINE SURGERY  2003  . CORONARY ANGIOPLASTY WITH STENT PLACEMENT  2002, 2015  . EYE SURGERY     cataracts bilaterally    There were no vitals filed for this visit.  Subjective Assessment - 02/17/17 1029    Subjective  No pain today. Had no pain post last session.     Currently in Pain?  No/denies              Pilates Tower for LE/Core strength,  postural strength, lumbopelvic disassociation and core control.  Exercises included:   Supine Leg [place]   yellow spring:  hamstring stretching.     Single and double leg arcs, squats and circles   Tried hip IR but only min avail ROM  Stiffness dominant , cues for maintaining neutral pelvis  Sidelying Leg [place] hip abd and add with close S/min A  For safety  Sidekick small ROM for control   Arm [place] slastix for arm arcs and T   Added SLR on opp leg for 10 each       PT Education - 02/17/17 1048    Education provided  Yes    Education Details  Pilates Tower     [employer]) Educated  Patient    Methods  Verbal cues;Explanation    Comprehension  Verbalized understanding;Need further instruction       PT Short Term Goals - 02/15/17 1256      PT SHORT TERM GOAL #1   Title  Pt will complete balance assessment and understand  results, set goal     Status  Achieved      PT SHORT TERM GOAL #2   Title  Pt will be I with simple HEP for cervical spine, UE strength an postuure     Status  Achieved      PT SHORT TERM GOAL #3   Title  Pt will report increased ability to stand from a sitting position and transfer in and out of the bed (25% improved)     Status  Achieved        PT Long Term Goals - 02/15/17 1257      PT LONG TERM GOAL #1   Title  Pt will be able to walk in the community as needed with only minimal increase in neck pain.     Baseline  limited by L calf pain and both , does not walk that far , maybe 15-20 min depends    Status  On-going      PT LONG TERM GOAL #2   Title  Pt will safely negotiate 12 or more stairs in the community with confidence and min to no fatigue, pain in hips.     Status  On-going      PT LONG TERM GOAL #3   Title  Pt will improve Berg balance score by 6 points 48/56.     Status  Partially Met      PT LONG TERM GOAL #4   Title  Pt will increase cervical AROM by 10 or more degrees in each direction, no lasting pain for improved  driving, travel.     Status  On-going      PT LONG TERM GOAL #5   Title  Pt will be able to use her LUE for a small portion (25%) of her ADLs due to increased ROM and less pain.     Status  On-going      PT LONG TERM GOAL #6   Title  Pt will transition to a community based fitness class/group.     Time  4    Period  Weeks    Status  New    Target Date  03/22/17            Plan - 02/17/17 1232    Clinical Impression Statement  Patient worked with [employer] today without pain increase.  More difficulty in sidelying to stabilize trunk.      PT Next Visit Plan  Reformer  vs tower     PT Home Exercise Plan  C AROM gentle , scap retraction and supine scapular stablization , bridging and anterior hip stretching     Consulted and Agree with Plan of Care  Patient       Patient will benefit from skilled therapeutic intervention in order to improve the following deficits and impairments:  Abnormal gait, Decreased endurance, Hypomobility, Impaired sensation, Obesity, Increased edema, Decreased activity tolerance, Decreased strength, Increased fascial restricitons, Impaired UE functional use, Pain, Difficulty walking, Decreased mobility, Decreased balance, Decreased range of motion, Improper body mechanics, Postural dysfunction, Impaired flexibility, Decreased coordination, Decreased safety awareness  Visit Diagnosis: Other abnormalities of gait and mobility  Cervicalgia  Muscle weakness (generalized)     Problem List Patient Active Problem List   Diagnosis Date Noted  . Cervical stenosis of spinal canal 10/23/2016  . Neck swelling 09/28/2011  . Weight gain 01/02/2011  . Carpal tunnel syndrome 11/02/2010  . Leg swelling 08/26/2010  . ADRENAL MASS, LEFT 05/23/2010  . SPINAL STENOSIS,  LUMBAR 12/27/2009  . B12 DEFICIENCY 06/10/2009  . ANEMIA 06/10/2009  . BACK PAIN, LUMBAR 11/06/2008  . KNEE PAIN, LEFT 02/03/2008  . DIABETES MELLITUS, TYPE II, UNCONTROLLED 01/12/2008  .  MENOPAUSE-RELATED VASOMOTOR SYMPTOMS, HOT FLASHES 06/23/2007  . BURSITIS, LEFT HIP 06/23/2007  . HYPERLIPIDEMIA 03/25/2007  . ANXIETY 03/25/2007  . DEPRESSION 03/25/2007  . PERIPHERAL NEUROPATHY 03/25/2007  . HYPERTENSION 03/25/2007  . CORONARY ARTERY DISEASE 03/25/2007  . DIVERTICULOSIS, COLON 03/25/2007  . TUBULOVILLOUS ADENOMA, COLON 02/18/2007    Jaynie Hitch 02/17/2017, 12:42 PM  [facility] [address] [place], [name], [address] Phone: [phone]   Fax:  [phone]  Name: [name] Bean MRN: [id number] Date of Birth: 31-Aug-1950  Raeford Razor, PT 02/17/17 12:46 PM Phone: [phone] Fax: [phone]

## 2017-03-01 ENCOUNTER — Ambulatory Visit: Payer: BC Managed Care – PPO | Admitting: Physical Therapy

## 2017-03-03 ENCOUNTER — Encounter: Payer: BC Managed Care – PPO | Admitting: Physical Therapy

## 2017-03-08 ENCOUNTER — Other Ambulatory Visit (HOSPITAL_COMMUNITY): Payer: Self-pay | Admitting: *Deleted

## 2017-03-08 ENCOUNTER — Ambulatory Visit: Payer: BC Managed Care – PPO | Admitting: Physical Therapy

## 2017-03-08 ENCOUNTER — Encounter: Payer: Self-pay | Admitting: Physical Therapy

## 2017-03-08 DIAGNOSIS — I5022 Chronic systolic (congestive) heart failure: Secondary | ICD-10-CM

## 2017-03-08 DIAGNOSIS — R2689 Other abnormalities of gait and mobility: Secondary | ICD-10-CM

## 2017-03-08 DIAGNOSIS — M542 Cervicalgia: Secondary | ICD-10-CM

## 2017-03-08 DIAGNOSIS — M6281 Muscle weakness (generalized): Secondary | ICD-10-CM

## 2017-03-08 NOTE — Therapy (Signed)
[place], [name], [address] Phone: [phone]   Fax:  [phone]  Physical Therapy Treatment  Patient Details  Name: [name] Bean MRN: [id number] Date of Birth: 09/03/1950 Referring Provider: Dr. Ashok Pall    Encounter Date: 03/08/2017  PT End of Session - 03/08/17 1149    Visit Number  19    Number of Visits  21    Date for PT Re-Evaluation  03/22/17    PT Start Time  6503    PT Stop Time  1235    PT Time Calculation (min)  50 min       Past Medical History:  Diagnosis Date  . Anemia   . Anxiety   . Arthritis   . Asthma   . Broken arm    left arm  . Bronchitis   . Bursitis of left hip   . CAD (coronary artery disease)   . Candidiasis, vagina   . Cardiac arrest ([place]) 06/20/2016   at [facility]; ? due to flash pulm edema vs undertreated chronic CHF  . Cerumen impaction   . CHF (congestive heart failure) ([place])    RECENT ADMIT TO [facility]   . Colon, diverticulosis   . Depression   . Diabetes mellitus    15 YRS AGO  . Fatigue   . Gastroenteritis   . Hot flashes, menopausal   . Hyperlipidemia   . Knee pain, left   . Lumbar back pain   . Maxillary sinusitis    history  . Muscle tear    right gluteus  . Nausea   . Other B-complex deficiencies   . Otitis media, acute    left  . Peripheral neuropathy   . Spinal stenosis of lumbar region   . Tubulovillous adenoma of colon   . Unspecified essential hypertension     Past Surgical History:  Procedure Laterality Date  . CARDIAC CATHETERIZATION  2015, 2009, 2006  . CERVICAL SPINE SURGERY  2003  . CORONARY ANGIOPLASTY WITH STENT PLACEMENT  2002, 2015  . EYE SURGERY     cataracts bilaterally  . POSTERIOR CERVICAL LAMINECTOMY N/A 10/23/2016   Procedure: POSTERIOR CERVICAL LAMINECTOMY MULTI LEVEL CERVICAL TWO- CERVICAL THREE, CERVICAL THREE- CERVICAL FOUR;  Surgeon: Ashok Pall, MD;  Location: [facility];  Service: Neurosurgery;  Laterality:  N/A;  POSTERIOR    There were no vitals filed for this visit.  Subjective Assessment - 03/08/17 1150    Subjective  Right side of neck has been hurting for about a week.     Currently in Pain?  Yes    Pain Score  3     Pain Location  Neck    Pain Orientation  Right    Pain Descriptors / Indicators  Discomfort    Aggravating Factors   evening, trying to sleep     Pain Relieving Factors  heat, stretch, repositioning.                       [facility] Adult PT Treatment/Exercise - 03/08/17 0001      Neck Exercises: Seated   Neck Retraction  10 reps    Postural Training  scap squeeze x 10       Manual Therapy   Soft tissue mobilization  posterior cervicals and upper traps, levator scap, mod pressure well tolerated in supine    Passive ROM  lateral flexion and rotation, used a towel to suspend head and improve relaxation ,  suboccipital release      Neck Exercises: Stretches   Upper Trapezius Stretch  3 reps;10 seconds    Levator Stretch  3 reps;10 seconds               PT Short Term Goals - 02/15/17 1256      PT SHORT TERM GOAL #1   Title  Pt will complete balance assessment and understand results, set goal     Status  Achieved      PT SHORT TERM GOAL #2   Title  Pt will be I with simple HEP for cervical spine, UE strength an postuure     Status  Achieved      PT SHORT TERM GOAL #3   Title  Pt will report increased ability to stand from a sitting position and transfer in and out of the bed (25% improved)     Status  Achieved        PT Long Term Goals - 02/15/17 1257      PT LONG TERM GOAL #1   Title  Pt will be able to walk in the community as needed with only minimal increase in neck pain.     Baseline  limited by L calf pain and both , does not walk that far , maybe 15-20 min depends    Status  On-going      PT LONG TERM GOAL #2   Title  Pt will safely negotiate 12 or more stairs in the community with confidence and min to no fatigue, pain in  hips.     Status  On-going      PT LONG TERM GOAL #3   Title  Pt will improve Berg balance score by 6 points 48/56.     Status  Partially Met      PT LONG TERM GOAL #4   Title  Pt will increase cervical AROM by 10 or more degrees in each direction, no lasting pain for improved driving, travel.     Status  On-going      PT LONG TERM GOAL #5   Title  Pt will be able to use her LUE for a small portion (25%) of her ADLs due to increased ROM and less pain.     Status  On-going      PT LONG TERM GOAL #6   Title  Pt will transition to a community based fitness class/group.     Time  4    Period  Weeks    Status  New    Target Date  03/22/17            Plan - 03/08/17 1300    Clinical Impression Statement   Pt with pain today. Focused manual toechniques to decrease pain. Will check goals next visit.     PT Next Visit Plan  Reformer  vs tower , manual prn, check goals     PT Home Exercise Plan  C AROM gentle , scap retraction and supine scapular stablization , bridging and anterior hip stretching     Consulted and Agree with Plan of Care  Patient       Patient will benefit from skilled therapeutic intervention in order to improve the following deficits and impairments:  Abnormal gait, Decreased endurance, Hypomobility, Impaired sensation, Obesity, Increased edema, Decreased activity tolerance, Decreased strength, Increased fascial restricitons, Impaired UE functional use, Pain, Difficulty walking, Decreased mobility, Decreased balance, Decreased range of motion, Improper body mechanics, Postural dysfunction, Impaired flexibility, Decreased  coordination, Decreased safety awareness  Visit Diagnosis: Other abnormalities of gait and mobility  Cervicalgia  Muscle weakness (generalized)     Problem List Patient Active Problem List   Diagnosis Date Noted  . Cervical stenosis of spinal canal 10/23/2016  . Neck swelling 09/28/2011  . Weight gain 01/02/2011  . Carpal tunnel  syndrome 11/02/2010  . Leg swelling 08/26/2010  . ADRENAL MASS, LEFT 05/23/2010  . SPINAL STENOSIS, LUMBAR 12/27/2009  . B12 DEFICIENCY 06/10/2009  . ANEMIA 06/10/2009  . BACK PAIN, LUMBAR 11/06/2008  . KNEE PAIN, LEFT 02/03/2008  . DIABETES MELLITUS, TYPE II, UNCONTROLLED 01/12/2008  . MENOPAUSE-RELATED VASOMOTOR SYMPTOMS, HOT FLASHES 06/23/2007  . BURSITIS, LEFT HIP 06/23/2007  . HYPERLIPIDEMIA 03/25/2007  . ANXIETY 03/25/2007  . DEPRESSION 03/25/2007  . PERIPHERAL NEUROPATHY 03/25/2007  . HYPERTENSION 03/25/2007  . CORONARY ARTERY DISEASE 03/25/2007  . DIVERTICULOSIS, COLON 03/25/2007  . TUBULOVILLOUS ADENOMA, COLON 02/18/2007    Dorene Ar, PTA 03/08/2017, 1:01 PM  [place], [name], [address] Phone: [phone]   Fax:  [phone]  Name: MAREN WIESEN MRN: [id number] Date of Birth: 02-05-1951

## 2017-03-09 ENCOUNTER — Telehealth (HOSPITAL_COMMUNITY): Payer: Self-pay

## 2017-03-09 ENCOUNTER — Encounter: Payer: Self-pay | Admitting: Family Medicine

## 2017-03-09 ENCOUNTER — Ambulatory Visit: Payer: BC Managed Care – PPO | Admitting: Family Medicine

## 2017-03-09 VITALS — BP 130/60 | HR 85 | Temp 97.6°F | Ht 62.0 in | Wt 187.4 lb

## 2017-03-09 DIAGNOSIS — Z720 Tobacco use: Secondary | ICD-10-CM

## 2017-03-09 DIAGNOSIS — F419 Anxiety disorder, unspecified: Secondary | ICD-10-CM

## 2017-03-09 DIAGNOSIS — F329 Major depressive disorder, single episode, unspecified: Secondary | ICD-10-CM

## 2017-03-09 DIAGNOSIS — Z23 Encounter for immunization: Secondary | ICD-10-CM

## 2017-03-09 DIAGNOSIS — I10 Essential (primary) hypertension: Secondary | ICD-10-CM | POA: Diagnosis not present

## 2017-03-09 DIAGNOSIS — Z794 Long term (current) use of insulin: Secondary | ICD-10-CM | POA: Diagnosis not present

## 2017-03-09 DIAGNOSIS — E1165 Type 2 diabetes mellitus with hyperglycemia: Secondary | ICD-10-CM

## 2017-03-09 LAB — GLUCOSE, POCT (MANUAL RESULT ENTRY): POC GLUCOSE: 326 mg/dL — AB (ref 70–99)

## 2017-03-09 MED ORDER — BASAGLAR KWIKPEN 100 UNIT/ML ~~LOC~~ SOPN: PEN_INJECTOR | SUBCUTANEOUS | 11 refills | Status: DC

## 2017-03-09 MED ORDER — ESCITALOPRAM OXALATE 20 MG PO TABS: 20.0000 mg | ORAL_TABLET | Freq: Every day | ORAL | 3 refills | Status: DC

## 2017-03-09 MED ORDER — INSULIN ASPART 100 UNIT/ML ~~LOC~~ SOLN: 12.0000 [IU] | Freq: Three times a day (TID) | SUBCUTANEOUS | 11 refills | Status: DC

## 2017-03-09 NOTE — Telephone Encounter (Signed)
Received new referral for Pulmonary Undergrad. Patients insurance is [employer] and will cover [employer].
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
## 2017-03-09 NOTE — Telephone Encounter (Signed)
Patients insurance is active and benefits verified through [employer] - No co-pay, deductible amount of $1,080/$1,080 has been met, out of pocket amount of $4,388/$4,388 has been met, 30% co-insurance, and no pre-authorization is required. Reference #[id number]  Patient will be contacted and scheduled.

## 2017-03-09 NOTE — Telephone Encounter (Signed)
Attempted to call patient in regards to Pulmonary Rehab - Lm on Vm °

## 2017-03-09 NOTE — Patient Instructions (Addendum)
Blood Glucose Monitoring, Adult Monitoring your blood sugar (glucose) helps you manage your diabetes. It also helps you and your health care provider determine how well your diabetes management plan is working. Blood glucose monitoring involves checking your blood glucose as often as directed, and keeping a record (log) of your results over time. Why should I monitor my blood glucose? Checking your blood glucose regularly can:  Help you understand how food, exercise, illnesses, and medicines affect your blood glucose.  Let you know what your blood glucose is at any time. You can quickly tell if you are having low blood glucose (hypoglycemia) or high blood glucose (hyperglycemia).  Help you and your health care provider adjust your medicines as needed.  When should I check my blood glucose? Follow instructions from your health care provider about how often to check your blood glucose. This may depend on:  The type of diabetes you have.  How well-controlled your diabetes is.  Medicines you are taking.  If you have type 1 diabetes:  Check your blood glucose at least 2 times a day.  Also check your blood glucose: ? Before every insulin injection. ? Before and after exercise. ? Between meals. ? 2 hours after a meal. ? Occasionally between 2:00 a.m. and 3:00 a.m., as directed. ? Before potentially dangerous tasks, like driving or using heavy machinery. ? At bedtime.  You may need to check your blood glucose more often, up to 6-10 times a day: ? If you use an insulin pump. ? If you need multiple daily injections (MDI). ? If your diabetes is not well-controlled. ? If you are ill. ? If you have a history of severe hypoglycemia. ? If you have a history of not knowing when your blood glucose is getting low (hypoglycemia unawareness). If you have type 2 diabetes:  If you take insulin or other diabetes medicines, check your blood glucose at least 2 times a day.  If you are on intensive  insulin therapy, check your blood glucose at least 4 times a day. Occasionally, you may also need to check between 2:00 a.m. and 3:00 a.m., as directed.  Also check your blood glucose: ? Before and after exercise. ? Before potentially dangerous tasks, like driving or using heavy machinery.  You may need to check your blood glucose more often if: ? Your medicine is being adjusted. ? Your diabetes is not well-controlled. ? You are ill. What is a blood glucose log?  A blood glucose log is a record of your blood glucose readings. It helps you and your health care provider: ? Look for patterns in your blood glucose over time. ? Adjust your diabetes management plan as needed.  Every time you check your blood glucose, write down your result and notes about things that may be affecting your blood glucose, such as your diet and exercise for the day.  Most glucose meters store a record of glucose readings in the meter. Some meters allow you to download your records to a computer. How do I check my blood glucose? Follow these steps to get accurate readings of your blood glucose: Supplies needed   Blood glucose meter.  Test strips for your meter. Each meter has its own strips. You must use the strips that come with your meter.  A needle to prick your finger (lancet). Do not use lancets more than once.  A device that holds the lancet (lancing device).  A journal or log book to write down your results. Procedure  Wash your hands with soap and water.  Prick the side of your finger (not the tip) with the lancet. Use a different finger each time.  Gently rub the finger until a small drop of blood appears.  Follow instructions that come with your meter for inserting the test strip, applying blood to the strip, and using your blood glucose meter.  Write down your result and any notes. Alternative testing sites  Some meters allow you to use areas of your body other than your finger  (alternative sites) to test your blood.  If you think you may have hypoglycemia, or if you have hypoglycemia unawareness, do not use alternative sites. Use your finger instead.  Alternative sites may not be as accurate as the fingers, because blood flow is slower in these areas. This means that the result you get may be delayed, and it may be different from the result that you would get from your finger.  The most common alternative sites are: ? Forearm. ? Thigh. ? Palm of the hand. Additional tips  Always keep your supplies with you.  If you have questions or need help, all blood glucose meters have a 24-hour "hotline" number that you can call. You may also contact your health care provider.  After you use a few boxes of test strips, adjust (calibrate) your blood glucose meter by following instructions that came with your meter. This information is not intended to replace advice given to you by your health care provider. Make sure you discuss any questions you have with your health care provider. Document Released: 04/02/2003 Document Revised: 10/18/2015 Document Reviewed: 09/09/2015 Elsevier Interactive Patient Education  2017 [employer] with Quitting Smoking Quitting smoking is a physical and mental challenge. You will face cravings, withdrawal symptoms, and temptation. Before quitting, work with your health care provider to make a plan that can help you cope. Preparation can help you quit and keep you from giving in. How can I cope with cravings? Cravings usually last for 5-10 minutes. If you get through it, the craving will pass. Consider taking the following actions to help you cope with cravings:  Keep your mouth busy: ? Chew sugar-free gum. ? Suck on hard candies or a straw. ? Brush your teeth.  Keep your hands and body busy: ? Immediately change to a different activity when you feel a craving. ? Squeeze or play with a ball. ? Do an activity or a hobby, like  making bead jewelry, practicing needlepoint, or working with wood. ? Mix up your normal routine. ? Take a short exercise break. Go for a quick walk or run up and down stairs. ? Spend time in public places where smoking is not allowed.  Focus on doing something kind or helpful for someone else.  Call a friend or family member to talk during a craving.  Join a support group.  Call a quit line, such as 1-800-QUIT-NOW.  Talk with your health care provider about medicines that might help you cope with cravings and make quitting easier for you.  How can I deal with withdrawal symptoms? Your body may experience negative effects as it tries to get used to not having nicotine in the system. These effects are called withdrawal symptoms. They may include:  Feeling hungrier than normal.  Trouble concentrating.  Irritability.  Trouble sleeping.  Feeling depressed.  Restlessness and agitation.  Craving a cigarette.  To manage withdrawal symptoms:  Avoid places, people, and activities that trigger your cravings.  Remember why you want to quit.  Get plenty of sleep.  Avoid coffee and other caffeinated drinks. These may worsen some of your symptoms.  How can I handle social situations? Social situations can be difficult when you are quitting smoking, especially in the first few weeks. To manage this, you can:  Avoid parties, bars, and other social situations where people might be smoking.  Avoid alcohol.  Leave right away if you have the urge to smoke.  Explain to your family and friends that you are quitting smoking. Ask for understanding and support.  Plan activities with friends or family where smoking is not an option.  What are some ways I can cope with stress? Wanting to smoke may cause stress, and stress can make you want to smoke. Find ways to manage your stress. Relaxation techniques can help. For example:  Breathe slowly and deeply, in through your nose and out  through your mouth.  Listen to soothing, relaxing music.  Talk with a family member or friend about your stress.  Light a candle.  Soak in a bath or take a shower.  Think about a peaceful place.  What are some ways I can prevent weight gain? Be aware that many people gain weight after they quit smoking. However, not everyone does. To keep from gaining weight, have a plan in place before you quit and stick to the plan after you quit. Your plan should include:  Having healthy snacks. When you have a craving, it may help to: ? Eat plain popcorn, crunchy carrots, celery, or other cut vegetables. ? Chew sugar-free gum.  Changing how you eat: ? Eat small portion sizes at meals. ? Eat 4-6 small meals throughout the day instead of 1-2 large meals a day. ? Be mindful when you eat. Do not watch television or do other things that might distract you as you eat.  Exercising regularly: ? Make time to exercise each day. If you do not have time for a long workout, do short bouts of exercise for 5-10 minutes several times a day. ? Do some form of strengthening exercise, like weight lifting, and some form of aerobic exercise, like running or swimming.  Drinking plenty of water or other low-calorie or no-calorie drinks. Drink 6-8 glasses of water daily, or as much as instructed by your health care provider.  Summary  Quitting smoking is a physical and mental challenge. You will face cravings, withdrawal symptoms, and temptation to smoke again. Preparation can help you as you go through these challenges.  You can cope with cravings by keeping your mouth busy (such as by chewing gum), keeping your body and hands busy, and making calls to family, friends, or a helpline for people who want to quit smoking.  You can cope with withdrawal symptoms by avoiding places where people smoke, avoiding drinks with caffeine, and getting plenty of rest.  Ask your health care provider about the different ways to  prevent weight gain, avoid stress, and handle social situations. This information is not intended to replace advice given to you by your health care provider. Make sure you discuss any questions you have with your health care provider. Document Released: 03/27/2016 Document Revised: 03/27/2016 Document Reviewed: 03/27/2016 Elsevier Interactive Patient Education  2018 [employer]. Diabetes Mellitus and Food It is important for you to manage your blood sugar (glucose) level. Your blood glucose level can be greatly affected by what you eat. Eating healthier foods in the appropriate amounts throughout the day at  about the same time each day will help you control your blood glucose level. It can also help slow or prevent worsening of your diabetes mellitus. Healthy eating may even help you improve the level of your blood pressure and reach or maintain a healthy weight. General recommendations for healthful eating and cooking habits include:  Eating meals and snacks regularly. Avoid going long periods of time without eating to lose weight.  Eating a diet that consists mainly of plant-based foods, such as fruits, vegetables, nuts, legumes, and whole grains.  Using low-heat cooking methods, such as baking, instead of high-heat cooking methods, such as deep frying.  Work with your dietitian to make sure you understand how to use the Nutrition Facts information on food labels. How can food affect me? Carbohydrates Carbohydrates affect your blood glucose level more than any other type of food. Your dietitian will help you determine how many carbohydrates to eat at each meal and teach you how to count carbohydrates. Counting carbohydrates is important to keep your blood glucose at a healthy level, especially if you are using insulin or taking certain medicines for diabetes mellitus. Alcohol Alcohol can cause sudden decreases in blood glucose (hypoglycemia), especially if you use insulin or take certain  medicines for diabetes mellitus. Hypoglycemia can be a life-threatening condition. Symptoms of hypoglycemia (sleepiness, dizziness, and disorientation) are similar to symptoms of having too much alcohol. If your health care provider has given you approval to drink alcohol, do so in moderation and use the following guidelines:  Women should not have more than one drink per day, and men should not have more than two drinks per day. One drink is equal to: ? 12 oz of beer. ? 5 oz of wine. ? 1 oz of hard liquor.  Do not drink on an empty stomach.  Keep yourself hydrated. Have water, diet soda, or unsweetened iced tea.  Regular soda, juice, and other mixers might contain a lot of carbohydrates and should be counted.  What foods are not recommended? As you make food choices, it is important to remember that all foods are not the same. Some foods have fewer nutrients per serving than other foods, even though they might have the same number of calories or carbohydrates. It is difficult to get your body what it needs when you eat foods with fewer nutrients. Examples of foods that you should avoid that are high in calories and carbohydrates but low in nutrients include:  Trans fats (most processed foods list trans fats on the Nutrition Facts label).  Regular soda.  Juice.  Candy.  Sweets, such as cake, pie, doughnuts, and cookies.  Fried foods.  What foods can I eat? Eat nutrient-rich foods, which will nourish your body and keep you healthy. The food you should eat also will depend on several factors, including:  The calories you need.  The medicines you take.  Your weight.  Your blood glucose level.  Your blood pressure level.  Your cholesterol level.  You should eat a variety of foods, including:  Protein. ? Lean cuts of meat. ? Proteins low in saturated fats, such as fish, egg whites, and beans. Avoid processed meats.  Fruits and vegetables. ? Fruits and vegetables that  may help control blood glucose levels, such as apples, mangoes, and yams.  Dairy products. ? Choose fat-free or low-fat dairy products, such as milk, yogurt, and cheese.  Grains, bread, pasta, and rice. ? Choose whole grain products, such as multigrain bread, whole oats, and brown  rice. These foods may help control blood pressure.  Fats. ? Foods containing healthful fats, such as nuts, avocado, olive oil, canola oil, and fish.  Does everyone with diabetes mellitus have the same meal plan? Because every person with diabetes mellitus is different, there is not one meal plan that works for everyone. It is very important that you meet with a dietitian who will help you create a meal plan that is just right for you. This information is not intended to replace advice given to you by your health care provider. Make sure you discuss any questions you have with your health care provider. Document Released: 12/25/2004 Document Revised: 09/05/2015 Document Reviewed: 02/24/2013 Elsevier Interactive Patient Education  2017 [employer].  How to [place] the Smoking Habit   Why should I quit smoking?  Quitting smoking is the most important thing you can do for your health. Smoking can cause cancer, lung disease, heart disease, and many other health problems. Secondhand smoke can be dangerous too. It can cause lung cancer and heart disease in adults. It can make asthma worse or cause ear infections in kids.   You'll see benefits as soon as you quit smoking. Your heart rate and blood pressure will go down. You'll breathe easier. It will be easier to exercise. Your sense of smell and taste will be better. You'll lower your risk of cancer, lung disease, and heart disease. You'll even live longer!   Why is it so hard to quit smoking?  Nicotine is a strong drug. Your body becomes addicted to nicotine when you smoke. You may have withdrawal symptoms or cravings when you stop smoking. You may become anxious or  irritable. You might have trouble sleeping or want to eat more. These symptoms are usually worst the first week after quitting. The good news is nicotine withdrawal symptoms only last a few weeks for most people.  The routines and habits that go along with smoking can make it tough to quit too. Some people often smoke a cigarette when they drive, after a meal, or when they're on the phone. Smoking can become a part of these routines. After you quit smoking these habits can be a trigger to make you want to smoke again. It's important to separate smoking from these routines when you quit.  How can I make it easier to quit?  You don't have to quit "cold [place]." You can double or triple the chance that you'll stop smoking if you use a medicine and counseling together. There are many medicines available. These medicines work in different ways to help manage nicotine withdrawal. Many can be bought off the shelves at your local pharmacy. Some require a prescription. Talk to your pharmacist or prescriber about what medicines may be right for you.  It is very important to have counseling when you quit. Medicines can help you cope with nicotine withdrawal. Counseling can help you develop skills to break smoking habits. There are lots of counseling options available. Many of these are free. Some options are local support groups, telephone quitlines, online services, and texting programs.   Start thinking now about how you plan to quit. Think about why you want to quit. Look at triggers that make you want to smoke. Plan for challenges you might face when trying to quit. Talk to your pharmacist about how to get help.   Where can I learn more?  Toll-free Quitlines and Websites:  In the [place] 1-800-QUIT-NOW(1-403-067-6112); http://smokefree.gov    These websites include online  support, live chat, and text messaging programs.

## 2017-03-10 ENCOUNTER — Ambulatory Visit: Payer: BC Managed Care – PPO | Admitting: Physical Therapy

## 2017-03-10 ENCOUNTER — Encounter: Payer: Self-pay | Admitting: Physical Therapy

## 2017-03-10 ENCOUNTER — Telehealth (HOSPITAL_COMMUNITY): Payer: Self-pay

## 2017-03-10 DIAGNOSIS — M542 Cervicalgia: Secondary | ICD-10-CM | POA: Diagnosis not present

## 2017-03-10 DIAGNOSIS — M6281 Muscle weakness (generalized): Secondary | ICD-10-CM

## 2017-03-10 DIAGNOSIS — R2689 Other abnormalities of gait and mobility: Secondary | ICD-10-CM

## 2017-03-10 NOTE — Telephone Encounter (Signed)
Patient returned phone call in regards to Pulmonary Rehab - Scheduled orientation on 04/09/2017 at 1:30pm. Patient will attend the 1:30pm exc class.

## 2017-03-10 NOTE — Therapy (Signed)
[name] [place], [name], [address] Phone: [phone]   Fax:  [phone]  Physical Therapy Treatment  Patient Details  Name: [name] Bean MRN: [id number] Date of Birth: 01/06/1951 Referring Provider: Dr. Ashok Pall    Encounter Date: 03/10/2017  PT End of Session - 03/10/17 1243    Visit Number  20    Number of Visits  21    Date for PT Re-Evaluation  03/22/17    PT Start Time  6962    PT Stop Time  1230    PT Time Calculation (min)  45 min       Past Medical History:  Diagnosis Date  . Anemia   . Anxiety   . Arthritis   . Asthma   . Broken arm    left arm  . Bronchitis   . Bursitis of left hip   . CAD (coronary artery disease)   . Candidiasis, vagina   . Cardiac arrest ([place]) 06/20/2016   at [facility]; ? due to flash pulm edema vs undertreated chronic CHF  . Cerumen impaction   . CHF (congestive heart failure) ([place])    RECENT ADMIT TO [facility]   . Colon, diverticulosis   . Depression   . Diabetes mellitus    15 YRS AGO  . Fatigue   . Gastroenteritis   . Hot flashes, menopausal   . Hyperlipidemia   . Knee pain, left   . Lumbar back pain   . Maxillary sinusitis    history  . Muscle tear    right gluteus  . Nausea   . Other B-complex deficiencies   . Otitis media, acute    left  . Peripheral neuropathy   . Spinal stenosis of lumbar region   . Tubulovillous adenoma of colon   . Unspecified essential hypertension     Past Surgical History:  Procedure Laterality Date  . CARDIAC CATHETERIZATION  2015, 2009, 2006  . CERVICAL SPINE SURGERY  2003  . CORONARY ANGIOPLASTY WITH STENT PLACEMENT  2002, 2015  . EYE SURGERY     cataracts bilaterally  . POSTERIOR CERVICAL LAMINECTOMY N/A 10/23/2016   Procedure: POSTERIOR CERVICAL LAMINECTOMY MULTI LEVEL CERVICAL TWO- CERVICAL THREE, CERVICAL THREE- CERVICAL FOUR;  Surgeon: Ashok Pall, MD;  Location: [facility];  Service: Neurosurgery;  Laterality:  N/A;  POSTERIOR    There were no vitals filed for this visit.  Subjective Assessment - 03/10/17 1240    Subjective  Neck is better     Currently in Pain?  Yes    Pain Score  2     Pain Location  Neck    Pain Orientation  Right                      [facility] Adult PT Treatment/Exercise - 03/10/17 0001      Lumbar Exercises: Supine   Other Supine Lumbar Exercises  Reformer: foot work on heel toes, neutral and ER, heel raises, prancing  bridge with ball, supine arms in strap red, pulldowns 5 times x 2 , feet in straps small arcs and squats x 8 each -CGA       Manual Therapy   Soft tissue mobilization  seated right cervical paraspinals and upper trap                PT Short Term Goals - 02/15/17 1256      PT SHORT TERM GOAL #1   Title  Pt will  complete balance assessment and understand results, set goal     Status  Achieved      PT SHORT TERM GOAL #2   Title  Pt will be I with simple HEP for cervical spine, UE strength an postuure     Status  Achieved      PT SHORT TERM GOAL #3   Title  Pt will report increased ability to stand from a sitting position and transfer in and out of the bed (25% improved)     Status  Achieved        PT Long Term Goals - 03/10/17 1244      PT LONG TERM GOAL #1   Title  Pt will be able to walk in the community as needed with only minimal increase in neck pain.     Baseline  reports holiday shopping, neck pain comes and goes     Time  6    Period  Weeks    Status  On-going      PT LONG TERM GOAL #2   Title  Pt will safely negotiate 12 or more stairs in the community with confidence and min to no fatigue, pain in hips.     Baseline  safe but fatigues    Time  6    Period  Weeks    Status  Partially Met      PT LONG TERM GOAL #3   Title  Pt will improve Berg balance score by 6 points 48/56.     Baseline  47/56    Time  6    Period  Weeks    Status  On-going      PT LONG TERM GOAL #4   Title  Pt will increase  cervical AROM by 10 or more degrees in each direction, no lasting pain for improved driving, travel.     Baseline  NT but needs to maintain    Time  6    Period  Weeks    Status  On-going      PT LONG TERM GOAL #5   Title  Pt will be able to use her LUE for a small portion (25%) of her ADLs due to increased ROM and less pain.     Time  6    Period  Weeks    Status  Unable to assess      PT LONG TERM GOAL #6   Title  Pt will transition to a community based fitness class/group.     Time  4    Status  On-going            Plan - 03/10/17 1241    Clinical Impression Statement  Pt reports decreased neck pain today. Used Pilates Reformer to challenge core, UE and LE strength. Pt fatigues but is motivated. She will be out of town and will need renewal when she returns. Neck pain reduced to 1/10 at end of treatment.     PT Next Visit Plan  Reformer  vs tower , manual prn, check goals/ ERO/FOTO    PT Home Exercise Plan  C AROM gentle , scap retraction and supine scapular stablization , bridging and anterior hip stretching     Consulted and Agree with Plan of Care  Patient       Patient will benefit from skilled therapeutic intervention in order to improve the following deficits and impairments:     Visit Diagnosis: Other abnormalities of gait and mobility  Cervicalgia  Muscle  weakness (generalized)     Problem List Patient Active Problem List   Diagnosis Date Noted  . Cervical stenosis of spinal canal 10/23/2016  . Neck swelling 09/28/2011  . Weight gain 01/02/2011  . Carpal tunnel syndrome 11/02/2010  . Leg swelling 08/26/2010  . ADRENAL MASS, LEFT 05/23/2010  . SPINAL STENOSIS, LUMBAR 12/27/2009  . B12 DEFICIENCY 06/10/2009  . ANEMIA 06/10/2009  . BACK PAIN, LUMBAR 11/06/2008  . KNEE PAIN, LEFT 02/03/2008  . DIABETES MELLITUS, TYPE II, UNCONTROLLED 01/12/2008  . MENOPAUSE-RELATED VASOMOTOR SYMPTOMS, HOT FLASHES 06/23/2007  . BURSITIS, LEFT HIP 06/23/2007  .  HYPERLIPIDEMIA 03/25/2007  . ANXIETY 03/25/2007  . DEPRESSION 03/25/2007  . PERIPHERAL NEUROPATHY 03/25/2007  . HYPERTENSION 03/25/2007  . CORONARY ARTERY DISEASE 03/25/2007  . DIVERTICULOSIS, COLON 03/25/2007  . TUBULOVILLOUS ADENOMA, COLON 02/18/2007    Dorene Ar , PTA 03/10/2017, 12:49 PM  [facility] [address] [place], [name], [address] Phone: [phone]   Fax:  [phone]  Name: [name] Bean MRN: [id number] Date of Birth: 09/26/1950

## 2017-03-15 ENCOUNTER — Encounter: Payer: BC Managed Care – PPO | Admitting: Physical Therapy

## 2017-03-16 ENCOUNTER — Encounter (HOSPITAL_COMMUNITY): Payer: Self-pay

## 2017-03-16 ENCOUNTER — Ambulatory Visit (HOSPITAL_COMMUNITY)
Admission: RE | Admit: 2017-03-16 | Discharge: 2017-03-16 | Disposition: A | Discharge status: Home / Self Care | Payer: BC Managed Care – PPO | Source: Ambulatory Visit | Attending: Cardiology | Admitting: Cardiology

## 2017-03-16 VITALS — BP 155/57 | HR 77 | Wt 192.0 lb

## 2017-03-16 DIAGNOSIS — J45909 Unspecified asthma, uncomplicated: Secondary | ICD-10-CM | POA: Diagnosis not present

## 2017-03-16 DIAGNOSIS — Z811 Family history of alcohol abuse and dependence: Secondary | ICD-10-CM | POA: Diagnosis not present

## 2017-03-16 DIAGNOSIS — Z7982 Long term (current) use of aspirin: Secondary | ICD-10-CM | POA: Insufficient documentation

## 2017-03-16 DIAGNOSIS — F17210 Nicotine dependence, cigarettes, uncomplicated: Secondary | ICD-10-CM | POA: Insufficient documentation

## 2017-03-16 DIAGNOSIS — Z8249 Family history of ischemic heart disease and other diseases of the circulatory system: Secondary | ICD-10-CM | POA: Diagnosis not present

## 2017-03-16 DIAGNOSIS — E1140 Type 2 diabetes mellitus with diabetic neuropathy, unspecified: Secondary | ICD-10-CM | POA: Diagnosis not present

## 2017-03-16 DIAGNOSIS — E785 Hyperlipidemia, unspecified: Secondary | ICD-10-CM | POA: Insufficient documentation

## 2017-03-16 DIAGNOSIS — Z833 Family history of diabetes mellitus: Secondary | ICD-10-CM | POA: Insufficient documentation

## 2017-03-16 DIAGNOSIS — I10 Essential (primary) hypertension: Secondary | ICD-10-CM

## 2017-03-16 DIAGNOSIS — F329 Major depressive disorder, single episode, unspecified: Secondary | ICD-10-CM | POA: Diagnosis not present

## 2017-03-16 DIAGNOSIS — F17200 Nicotine dependence, unspecified, uncomplicated: Secondary | ICD-10-CM | POA: Diagnosis not present

## 2017-03-16 DIAGNOSIS — I110 Hypertensive heart disease with heart failure: Secondary | ICD-10-CM | POA: Diagnosis present

## 2017-03-16 DIAGNOSIS — Z794 Long term (current) use of insulin: Secondary | ICD-10-CM | POA: Insufficient documentation

## 2017-03-16 DIAGNOSIS — Z888 Allergy status to other drugs, medicaments and biological substances status: Secondary | ICD-10-CM | POA: Insufficient documentation

## 2017-03-16 DIAGNOSIS — Z8674 Personal history of sudden cardiac arrest: Secondary | ICD-10-CM | POA: Insufficient documentation

## 2017-03-16 DIAGNOSIS — I2510 Atherosclerotic heart disease of native coronary artery without angina pectoris: Secondary | ICD-10-CM | POA: Diagnosis not present

## 2017-03-16 DIAGNOSIS — Z79899 Other long term (current) drug therapy: Secondary | ICD-10-CM | POA: Insufficient documentation

## 2017-03-16 DIAGNOSIS — M48061 Spinal stenosis, lumbar region without neurogenic claudication: Secondary | ICD-10-CM | POA: Diagnosis not present

## 2017-03-16 DIAGNOSIS — I5032 Chronic diastolic (congestive) heart failure: Secondary | ICD-10-CM

## 2017-03-16 DIAGNOSIS — Z955 Presence of coronary angioplasty implant and graft: Secondary | ICD-10-CM | POA: Insufficient documentation

## 2017-03-16 DIAGNOSIS — F419 Anxiety disorder, unspecified: Secondary | ICD-10-CM | POA: Insufficient documentation

## 2017-03-16 NOTE — Patient Instructions (Signed)
No changes to medication at this time.  No labs today.  Follow up 3 months with lab work.  Take all medication as prescribed the day of your appointment. Bring all medications with you to your appointment.  Do the following things EVERYDAY: 1) Weigh yourself in the morning before breakfast. Write it down and keep it in a log. 2) Take your medicines as prescribed 3) Eat low salt foods-Limit salt (sodium) to 2000 mg per day.  4) Stay as active as you can everyday 5) Limit all fluids for the day to less than 2 liters

## 2017-03-16 NOTE — Progress Notes (Signed)
ADVANCED HF CLINIC CONSULT NOTE  Referring Physician: Sharren Bridge NP-C  Primary Care: Primary Cardiologist: Sharren Bridge HF MD: Dr Haroldine Laws.    HPI: [name] is a 66 year old with a h/o cardiac arrest 2018, CAD S/P PCI Stent  to RCA 2002 and distal LAD 3790, chronic diastolic heart failure, HTN, Hyperlipidemia, spinal stenosis, tobacco use and DMII referred to HF clinic by Sharren Bridge NP at [facility] for HF follow up.   Admitted March 2018 to [facility] after cardiac arrest (PEA) thought to be from pulmonary edema .Ran out of lasix 2 weeks prior to admit. Initially intubated. Had strep pneumo in sputum. Ruled out for PE. No evidenced of ischemia. Diuresed with IV lasix transition to 60 mg torsemide daily. Discharge weight 191 pounds. Torsemide was later cut back to 20 mg daily at the office visit 08/03/2016.   Todays she retursn for HF follow up. Weight at home 186 pounds. Taking all medications but she did not take medications this morning. Denies SOB/PND/Orthopnea. Plans to start pulmonary rehab next week and she continues PT a few days a week. No CP.    CMRI 06/2016 1. The left ventricle is normal in cavity size and overall wall thickness. There is basal sigmoid septal hypertrophy. Global systolic function is normal. The LV ejection fraction is 65%. There is hypokinesia of the basal and mid inferior wall, and hypokinesia of the distal anterior wall. 2. The right ventricle is normal in cavity size, wall thickness, and systolic function. 3. The left atrium is mildly dilated, the right atrium is normal in size. 4. The aortic valve is trileaflet with no significant aortic valve stenosis or regurgitation. There is no significant valvular disease.  5. Delayed enhancement imaging demonstrates two areas with myocardial infarction: a. subendocardial (25% transmural extent) infarct in the mid and apical anterior wall (LAD territory). b. infarct in the basal to distal inferior wall (RCA territory),  which is 75% transmural at the base, and subendocardial (25% transmural extent) in the mid and distal segments. There is residual viability in all territories, except the basal inferior segment. 6. Regadenoson stress perfusion imaging demonstrates no evidence of inducible myocardial ischemia. 7. No LV thrombus seen.   Past Medical History:  Diagnosis Date  . Anemia   . Anxiety   . Arthritis   . Asthma   . Broken arm    left arm  . Bronchitis   . Bursitis of left hip   . CAD (coronary artery disease)   . Candidiasis, vagina   . Cardiac arrest ([place]) 06/20/2016   at [facility]; ? due to flash pulm edema vs undertreated chronic CHF  . Cerumen impaction   . CHF (congestive heart failure) ([place])    RECENT ADMIT TO [facility]   . Colon, diverticulosis   . Depression   . Diabetes mellitus    15 YRS AGO  . Fatigue   . Gastroenteritis   . Hot flashes, menopausal   . Hyperlipidemia   . Knee pain, left   . Lumbar back pain   . Maxillary sinusitis    history  . Muscle tear    right gluteus  . Nausea   . Other B-complex deficiencies   . Otitis media, acute    left  . Peripheral neuropathy   . Spinal stenosis of lumbar region   . Tubulovillous adenoma of colon   . Unspecified essential hypertension     Current Outpatient Medications  Medication Sig Dispense Refill  . aspirin EC 81  MG tablet Take 81 mg by mouth daily.     . BD PEN NEEDLE NANO U/F 32G X 4 MM MISC USE TO INJECT INSULIN 5 TIMES A DAY 100 each 5  . bimatoprost (LUMIGAN) 0.01 % SOLN Lumigan 0.01 % eye drops  PUT 1 DROP INTO BOTH EYES AT BEDTIME    . Blood Glucose Monitoring Suppl (ACCU-CHEK COMPACT CARE KIT) KIT Accu-Chek Compact Plus Care kit  USE AS INSTRUCTED.    [name] carvedilol (COREG) 25 MG tablet Take 1 tablet (25 mg total) by mouth 2 (two) times daily with a meal. 60 tablet 6  . cyclobenzaprine (FLEXERIL) 5 MG tablet cyclobenzaprine 5 mg tablet  TAKE 1 TABLET (5 MG TOTAL) BY MOUTH 3 (THREE) TIMES DAILY AS NEEDED FOR  MUSCLE SPASMS.    [name] dexamethasone (DECADRON) 4 MG tablet dexamethasone 4 mg tablet    . escitalopram (LEXAPRO) 20 MG tablet Take 1 tablet (20 mg total) by mouth daily. 90 tablet 3  . hydrALAZINE (APRESOLINE) 50 MG tablet Take 50-100 mg by mouth See admin instructions. Take 100 mg by mouth in the morning and take 50 mg by mouth in the evening    . insulin aspart (NOVOLOG) 100 UNIT/ML injection Inject 12 Units into the skin 3 (three) times daily before meals. 10 mL 11  . Insulin Glargine (BASAGLAR KWIKPEN) 100 UNIT/ML SOPN INJECT 20 UNITS SUBCUTANEOUSLY NIGHTLY 15 mL 11  . latanoprost (XALATAN) 0.005 % ophthalmic solution Place 1 drop into both eyes at bedtime.    [name] losartan (COZAAR) 100 MG tablet Take 100 mg by mouth daily.    . Melatonin 3 MG TABS Take 3 mg by mouth at bedtime as needed (sleep).    . rosuvastatin (CRESTOR) 20 MG tablet Take 20 mg by mouth daily.    [name] spironolactone (ALDACTONE) 25 MG tablet Take 25 mg by mouth daily.    [name] torsemide (DEMADEX) 20 MG tablet Take 20-40 mg by mouth See admin instructions. Take 20 mg by mouth daily and may take 20-40 mg by mouth as needed for swelling    . varenicline (CHANTIX CONTINUING MONTH PAK) 1 MG tablet Take 1 tablet (1 mg total) by mouth 2 (two) times daily. 60 tablet 6   No current facility-administered medications for this encounter.     Allergies  Allergen Reactions  . Sitagliptin Other (See Comments)    Had pancreatitis while on Januvia       Social History   Socioeconomic History  . Marital status: Single    Spouse name: Not on file  . Number of children: Not on file  . Years of education: Not on file  . Highest education level: Not on file  Social Needs  . Financial resource strain: Not on file  . Food insecurity - worry: Not on file  . Food insecurity - inability: Not on file  . Transportation needs - medical: Not on file  . Transportation needs - non-medical: Not on file  Occupational History  . Occupation: SR DEV  OFFICER    Employer: [employer]    Comment: [employer]  Tobacco Use  . Smoking status: Current Every Day Smoker    Packs/day: 0.25    Years: 4.00    Pack years: 1.00  . Smokeless tobacco: Never Used  . Tobacco comment: smokes two a day  Substance and Sexual Activity  . Alcohol use: Yes    Comment: rarely  . Drug use: Yes    Types: Marijuana  Comment: about two years ago  . Sexual activity: Not on file  Other Topics Concern  . Not on file  Social History Narrative  . Not on file      Family History  Problem Relation Age of Onset  . Pneumonia Father        deceased  . Alcohol abuse Father   . Hypertension Mother   . Diabetes Mother     Vitals:   03/16/17 1050  BP: (!) 155/57  Pulse: 77  SpO2: 100%  Weight: 192 lb (87.1 kg)    PHYSICAL EXAM: General:  Well appearing. No resp difficulty. Walked in the clinic without difficulty.  HEENT: normal Neck: supple. no JVD. Carotids 2+ bilat; no bruits. No lymphadenopathy or thryomegaly appreciated. Cor: PMI nondisplaced. Regular rate & rhythm. No rubs, gallops or murmurs. Lungs: clear Abdomen: soft, nontender, nondistended. No hepatosplenomegaly. No bruits or masses. Good bowel sounds. Extremities: no cyanosis, clubbing, rash, edema Neuro: alert & orientedx3, cranial nerves grossly intact. moves all 4 extremities w/o difficulty. Affect pleasant    ASSESSMENT & PLAN:  1. Chronic Diastolic Heart Failure: cMRI EF 60-65%.  - NYHA II. Volume status stable.  Continue current dose of torsemide and spironolactone.    2. CAD- S/P Stent RCA 2002 and LAD 2015 -stress cMRI at [facility] 3/18 with EF 60-65% no reversible ischemia -No s/s ischemia  - Continue statin and ASA.   3. HTN Elelvated but she did not take meds this morning.  Continue current regimem.   4. Tobacco abuse - Discussed smoking cessation.   5. Spinal Stenosis - s/p cervical laminectomy.   Follow up in 3 months. Check BMET at that time.   Amy Ninfa Meeker,  NP-C 10:56 AM

## 2017-03-17 ENCOUNTER — Encounter: Payer: BC Managed Care – PPO | Admitting: Physical Therapy

## 2017-03-22 ENCOUNTER — Encounter: Payer: BC Managed Care – PPO | Admitting: Physical Therapy

## 2017-03-24 ENCOUNTER — Ambulatory Visit: Payer: BC Managed Care – PPO | Admitting: Physical Therapy

## 2017-03-29 ENCOUNTER — Ambulatory Visit: Payer: BC Managed Care – PPO | Attending: Neurosurgery | Admitting: Physical Therapy

## 2017-03-29 ENCOUNTER — Encounter: Payer: Self-pay | Admitting: Physical Therapy

## 2017-03-29 DIAGNOSIS — R2689 Other abnormalities of gait and mobility: Secondary | ICD-10-CM | POA: Diagnosis not present

## 2017-03-29 DIAGNOSIS — M6281 Muscle weakness (generalized): Secondary | ICD-10-CM | POA: Diagnosis present

## 2017-03-29 DIAGNOSIS — M542 Cervicalgia: Secondary | ICD-10-CM | POA: Insufficient documentation

## 2017-03-29 NOTE — Therapy (Signed)
[name] Bean, [name], [address] Phone: [phone]   Fax:  [phone]  Physical Therapy Treatment/Renewal Patient Details  Name: [name] Bean MRN: [id number] Date of Birth: 03-22-1951 Referring Provider: Dr. Ashok Pall    Encounter Date: 03/29/2017  PT End of Session - 03/29/17 1523    Visit Number  21    Number of Visits  33    Date for PT Re-Evaluation  04/29/17    PT Start Time  1111    PT Stop Time  1150    PT Time Calculation (min)  39 min    Activity Tolerance  Patient tolerated treatment well    Behavior During Therapy  [facility] for tasks assessed/performed       Past Medical History:  Diagnosis Date  . Anemia   . Anxiety   . Arthritis   . Asthma   . Broken arm    left arm  . Bronchitis   . Bursitis of left hip   . CAD (coronary artery disease)   . Candidiasis, vagina   . Cardiac arrest ([place]) 06/20/2016   at [facility]; ? due to flash pulm edema vs undertreated chronic CHF  . Cerumen impaction   . CHF (congestive heart failure) ([place])    RECENT ADMIT TO [facility]   . Colon, diverticulosis   . Depression   . Diabetes mellitus    15 YRS AGO  . Fatigue   . Gastroenteritis   . Hot flashes, menopausal   . Hyperlipidemia   . Knee pain, left   . Lumbar back pain   . Maxillary sinusitis    history  . Muscle tear    right gluteus  . Nausea   . Other B-complex deficiencies   . Otitis media, acute    left  . Peripheral neuropathy   . Spinal stenosis of lumbar region   . Tubulovillous adenoma of colon   . Unspecified essential hypertension     Past Surgical History:  Procedure Laterality Date  . CARDIAC CATHETERIZATION  2015, 2009, 2006  . CERVICAL SPINE SURGERY  2003  . CORONARY ANGIOPLASTY WITH STENT PLACEMENT  2002, 2015  . EYE SURGERY     cataracts bilaterally  . POSTERIOR CERVICAL LAMINECTOMY N/A 10/23/2016   Procedure: POSTERIOR CERVICAL LAMINECTOMY MULTI LEVEL CERVICAL TWO-  CERVICAL THREE, CERVICAL THREE- CERVICAL FOUR;  Surgeon: Ashok Pall, MD;  Location: [facility];  Service: Neurosurgery;  Laterality: N/A;  POSTERIOR    There were no vitals filed for this visit.  Subjective Assessment - 03/29/17 1113    Subjective  I'm hurting today. Will be doing Pulmonary Rehab in Jan. I want to keep coming. I fell out of the bed Monday night. I need to work on getting me up off the floor.  When I sleep on my Lt UE I can;t use it much .     Currently in Pain?  Yes    Pain Score  5     Pain Location  Neck    Pain Orientation  Right    Pain Type  Chronic pain    Pain Onset  More than a month ago    Pain Frequency  Intermittent         [facility] PT Assessment - 03/29/17 0001      AROM   Right Shoulder Flexion  118 Degrees    Left Shoulder Flexion  78 Degrees    Cervical Flexion  45    Cervical Extension  25    Cervical - Right Side Bend  10    Cervical - Left Side Bend  10    Cervical - Right Rotation  60    Cervical - Left Rotation  50      Strength   Right Hip Flexion  4/5    Left Hip Flexion  4/5    Right Knee Flexion  5/5    Right Knee Extension  5/5    Left Knee Flexion  5/5    Left Knee Extension  5/5      Flexibility   Soft Tissue Assessment /Muscle Length  -- tight throughout hips    Hamstrings  tight       Transfers   Five time sit to stand comments   18 sec          [facility] Adult PT Treatment/Exercise - 03/29/17 0001      Neck Exercises: Seated   Neck Retraction  10 reps    Postural Training  scap squeeze x 10       Knee/Hip Exercises: Aerobic   Nustep  5 min L 7 UE and UE, cues for larger ampltude UE /LE      Shoulder Exercises: Standing   Flexion  AAROM;Right;20 reps    Other Standing Exercises  UE ranger              PT Education - 03/29/17 1522    Education provided  Yes    Education Details  renewal, floor transfers    Person(s) Educated  Patient    Methods  Verbal cues    Comprehension  Verbalized understanding        PT Short Term Goals - 03/29/17 1130      PT SHORT TERM GOAL #1   Title  Pt will complete balance assessment and understand results, set goal     Status  Achieved      PT SHORT TERM GOAL #2   Title  Pt will be I with simple HEP for cervical spine, UE strength an postuure     Status  Achieved      PT SHORT TERM GOAL #3   Title  Pt will report increased ability to stand from a sitting position and transfer in and out of the bed (25% improved)     Status  Achieved        PT Long Term Goals - 03/29/17 1130      PT LONG TERM GOAL #1   Title  Pt will be able to walk in the community as needed with only minimal increase in neck pain.     Baseline  uses cane, or cart , hip pain     Status  On-going      PT LONG TERM GOAL #2   Title  Pt will safely negotiate 12 or more stairs in the community with confidence and min to no fatigue, pain in hips.     Baseline  safe but fatigues, pain in hips     Status  Partially Met      PT LONG TERM GOAL #3   Title  Pt will improve Berg balance score by 6 points 48/56.     Status  On-going      PT LONG TERM GOAL #4   Title  Pt will increase cervical AROM by 10 or more degrees in each direction, no lasting pain for improved driving, travel.     Baseline  needs to maintain, pain improved  Status  Partially Met      PT LONG TERM GOAL #5   Title  Pt will be able to use her LUE for a small portion (25%) of her ADLs due to increased ROM and less pain.     Baseline  avoids due to pain     Status  On-going      PT LONG TERM GOAL #6   Title  Pt will transition to a community based fitness class/group.     Status  On-going      PT LONG TERM GOAL #7   Title  Pt will get up from the floor unassisted as needed for safety with travel and at home.     Time  6    Period  Weeks    Status  New    Target Date  05/10/17            Plan - 04-16-2017 1525    Clinical Impression Statement  Pt was out of town and then snow/weather prevented patient  from attending PT.  She continues to have joint stiffness in UEs, neck , back and hips.  She is ataxic with gait, lacks coordination in LEs.  She has pain in Rt. hip anf Rt. neck which limit her ability to walk, tunr head and maintain positions.  She will benefit fom Pilates to address core and smooth coordinated movements, provide support for safe ROM and strength.      PT Frequency  2x / week    PT Duration  6 weeks begining Jan after holiday    PT Treatment/Interventions  ADLs/Self Care Home Management;Patient/family education;DME Instruction;Gait training;Stair training;Cryotherapy;Electrical Stimulation;Moist Heat;Ultrasound;Neuromuscular re-education;Passive range of motion;Balance training;Therapeutic exercise;Therapeutic activities;Manual techniques;Functional mobility training;Taping;Other (comment) PIlates     PT Next Visit Plan  Reformer  vs tower , manual prn,     PT Home Exercise Plan  C AROM gentle , scap retraction and supine scapular stablization , bridging and anterior hip stretching     Consulted and Agree with Plan of Care  Patient       Patient will benefit from skilled therapeutic intervention in order to improve the following deficits and impairments:  Abnormal gait, Decreased endurance, Hypomobility, Impaired sensation, Obesity, Increased edema, Decreased activity tolerance, Decreased strength, Increased fascial restricitons, Impaired UE functional use, Pain, Difficulty walking, Decreased mobility, Decreased balance, Decreased range of motion, Improper body mechanics, Postural dysfunction, Impaired flexibility, Decreased coordination, Decreased safety awareness  Visit Diagnosis: Other abnormalities of gait and mobility  Cervicalgia  Muscle weakness (generalized)   G-Codes - Apr 16, 2017 1139    Functional Assessment Tool Used (Outpatient Only)  clinical judgment     Functional Limitation  Mobility: Walking and moving around    Mobility: Walking and Moving Around Current  Status [phone])  At least 40 percent but less than 60 percent impaired, limited or restricted    Mobility: Walking and Moving Around Goal Status [phone])  At least 20 percent but less than 40 percent impaired, limited or restricted       Problem List Patient Active Problem List   Diagnosis Date Noted  . Cervical stenosis of spinal canal 10/23/2016  . Neck swelling 09/28/2011  . Weight gain 01/02/2011  . Carpal tunnel syndrome 11/02/2010  . Leg swelling 08/26/2010  . ADRENAL MASS, LEFT 05/23/2010  . SPINAL STENOSIS, LUMBAR 12/27/2009  . B12 DEFICIENCY 06/10/2009  . ANEMIA 06/10/2009  . BACK PAIN, LUMBAR 11/06/2008  . KNEE PAIN, LEFT 02/03/2008  . DIABETES MELLITUS, TYPE II,  UNCONTROLLED 01/12/2008  . MENOPAUSE-RELATED VASOMOTOR SYMPTOMS, HOT FLASHES 06/23/2007  . BURSITIS, LEFT HIP 06/23/2007  . HYPERLIPIDEMIA 03/25/2007  . ANXIETY 03/25/2007  . DEPRESSION 03/25/2007  . PERIPHERAL NEUROPATHY 03/25/2007  . HYPERTENSION 03/25/2007  . CORONARY ARTERY DISEASE 03/25/2007  . DIVERTICULOSIS, COLON 03/25/2007  . TUBULOVILLOUS ADENOMA, COLON 02/18/2007    PAA,JENNIFER 03/29/2017, 3:31 PM  [facility] [address] [place], [name], [address] Phone: [phone]   Fax:  [phone]  Name: MAYSEL MCCOLM MRN: [id number] Date of Birth: Aug 29, 1950  Raeford Razor, PT 03/29/17 3:32 PM Phone: [phone] Fax: [phone]

## 2017-03-30 ENCOUNTER — Encounter: Payer: Self-pay | Admitting: Family Medicine

## 2017-03-31 ENCOUNTER — Ambulatory Visit: Payer: BC Managed Care – PPO | Admitting: Physical Therapy

## 2017-04-02 ENCOUNTER — Other Ambulatory Visit: Payer: Self-pay | Admitting: *Deleted

## 2017-04-02 DIAGNOSIS — E1165 Type 2 diabetes mellitus with hyperglycemia: Secondary | ICD-10-CM

## 2017-04-02 DIAGNOSIS — Z794 Long term (current) use of insulin: Principal | ICD-10-CM

## 2017-04-02 MED ORDER — BASAGLAR KWIKPEN 100 UNIT/ML ~~LOC~~ SOPN
PEN_INJECTOR | SUBCUTANEOUS | 11 refills | Status: DC
Start: 2017-04-02 — End: 2017-04-15

## 2017-04-02 MED ORDER — GLUCOSE BLOOD VI STRP: 1.0000 | ORAL_STRIP | Freq: Every day | 12 refills | Status: DC

## 2017-04-07 ENCOUNTER — Telehealth (HOSPITAL_COMMUNITY): Payer: Self-pay

## 2017-04-07 NOTE — Telephone Encounter (Signed)
Patient called to reschedule orientation - Changed orientation date to 05/10/2017 at 9:15am. Patient would like to orient as soon as possible - If someone cancels she would like to take that place.

## 2017-04-09 ENCOUNTER — Ambulatory Visit (HOSPITAL_COMMUNITY): Payer: BC Managed Care – PPO

## 2017-04-14 ENCOUNTER — Ambulatory Visit: Payer: BC Managed Care – PPO | Attending: Neurosurgery | Admitting: Physical Therapy

## 2017-04-14 ENCOUNTER — Encounter: Payer: Self-pay | Admitting: Physical Therapy

## 2017-04-14 DIAGNOSIS — M542 Cervicalgia: Secondary | ICD-10-CM | POA: Insufficient documentation

## 2017-04-14 DIAGNOSIS — M6281 Muscle weakness (generalized): Secondary | ICD-10-CM | POA: Insufficient documentation

## 2017-04-14 DIAGNOSIS — R2689 Other abnormalities of gait and mobility: Secondary | ICD-10-CM | POA: Diagnosis not present

## 2017-04-14 NOTE — Therapy (Signed)
[place], [name], [address] Phone: [phone]   Fax:  [phone]  Physical Therapy Treatment  Patient Details  Name: [name] Bean MRN: [id number] Date of Birth: 1951-02-14 Referring Provider: Dr. Ashok Pall    Encounter Date: 04/14/2017  PT End of Session - 04/14/17 1206    Visit Number  22    Number of Visits  33    Date for PT Re-Evaluation  04/29/17    PT Start Time  5436    PT Stop Time  1231    PT Time Calculation (min)  38 min    Activity Tolerance  Patient tolerated treatment well    Behavior During Therapy  [facility] for tasks assessed/performed       Past Medical History:  Diagnosis Date  . Anemia   . Anxiety   . Arthritis   . Asthma   . Broken arm    left arm  . Bronchitis   . Bursitis of left hip   . CAD (coronary artery disease)   . Candidiasis, vagina   . Cardiac arrest ([place]) 06/20/2016   at [facility]; ? due to flash pulm edema vs undertreated chronic CHF  . Cerumen impaction   . CHF (congestive heart failure) ([place])    RECENT ADMIT TO [facility]   . Colon, diverticulosis   . Depression   . Diabetes mellitus    15 YRS AGO  . Fatigue   . Gastroenteritis   . Hot flashes, menopausal   . Hyperlipidemia   . Knee pain, left   . Lumbar back pain   . Maxillary sinusitis    history  . Muscle tear    right gluteus  . Nausea   . Other B-complex deficiencies   . Otitis media, acute    left  . Peripheral neuropathy   . Spinal stenosis of lumbar region   . Tubulovillous adenoma of colon   . Unspecified essential hypertension     Past Surgical History:  Procedure Laterality Date  . CARDIAC CATHETERIZATION  2015, 2009, 2006  . CERVICAL SPINE SURGERY  2003  . CORONARY ANGIOPLASTY WITH STENT PLACEMENT  2002, 2015  . EYE SURGERY     cataracts bilaterally  . POSTERIOR CERVICAL LAMINECTOMY N/A 10/23/2016   Procedure: POSTERIOR CERVICAL LAMINECTOMY MULTI LEVEL CERVICAL TWO-  CERVICAL THREE, CERVICAL THREE- CERVICAL FOUR;  Surgeon: Ashok Pall, MD;  Location: [facility];  Service: Neurosurgery;  Laterality: N/A;  POSTERIOR    There were no vitals filed for this visit.  Subjective Assessment - 04/14/17 1157    Subjective  Pt has not been here in a few weeks.  She has only pain in 1 place in my neck.  Donnald Garre been quite balanced.  Was in [place] .     Currently in Pain?  Yes    Pain Score  3     Pain Location  Neck    Pain Orientation  Right    Pain Descriptors / Indicators  Discomfort    Pain Type  Chronic pain    Pain Onset  More than a month ago    Pain Frequency  Intermittent    Aggravating Factors   PM     Pain Relieving Factors  heat, stretch           [facility] Adult PT Treatment/Exercise - 04/14/17 0001      Lumbar Exercises: Supine   Other Supine Lumbar Exercises  Pilates Reformer:See note  Knee/Hip Exercises: Stretches   Other Knee/Hip Stretches  knee to chest with opp leg straight x 3 each side, 30 sec        Pilates Reformer used for LE/core strength, postural strength, lumbopelvic disassociation and core control.  Exercises included:  Footwork 2 Red 1 blue heels and forefoot in parallel and in turnout.    Bridging done on platform and with feet on footbar.  Max cues for articulation.  Took it back to a posterior pelvic tilt and performed multiple reps.    Supine Arms 1 Red spring Arcs in parallel and T x 10 each. Manual A for maintaining table top legs. Hips extend as well as lumbar spine.   Tight ant hip contributes.     PT Education - 04/14/17 1231    Education provided  Yes    Education Details  pelvic mobility , core     Person(s) Educated  Patient    Methods  Explanation;Handout;Demonstration    Comprehension  Verbalized understanding;Returned demonstration       PT Short Term Goals - 03/29/17 1130      PT SHORT TERM GOAL #1   Title  Pt will complete balance assessment and understand results, set goal     Status  Achieved       PT SHORT TERM GOAL #2   Title  Pt will be I with simple HEP for cervical spine, UE strength an postuure     Status  Achieved      PT SHORT TERM GOAL #3   Title  Pt will report increased ability to stand from a sitting position and transfer in and out of the bed (25% improved)     Status  Achieved        PT Long Term Goals - 04/14/17 1258      PT LONG TERM GOAL #1   Title  Pt will be able to walk in the community as needed with only minimal increase in neck pain.     Status  Achieved      PT LONG TERM GOAL #2   Title  Pt will safely negotiate 12 or more stairs in the community with confidence and min to no fatigue, pain in hips.     Baseline  safe but fatigues, pain in hips     Status  Partially Met      PT LONG TERM GOAL #3   Title  Pt will improve Berg balance score by 6 points 48/56.     Status  Unable to assess      PT LONG TERM GOAL #4   Title  Pt will increase cervical AROM by 10 or more degrees in each direction, no lasting pain for improved driving, travel.     Baseline  needs to maintain, pain improved     Status  Partially Met      PT LONG TERM GOAL #5   Title  Pt will be able to use her LUE for a small portion (25%) of her ADLs due to increased ROM and less pain.     Status  On-going      PT LONG TERM GOAL #6   Title  Pt will transition to a community based fitness class/group.     Status  On-going      PT LONG TERM GOAL #7   Title  Pt will get up from the floor unassisted as needed for safety with travel and at home.     Status  On-going            Plan - 04/14/17 1207    Clinical Impression Statement  Pt has been out of town again.  Starts cardiac rehab end of the month. She needs increaased time and max cues for PIlates exercises and instruction.  We worked on spinal mobility and activation of lower abdominals.  Was able to do in sitting with less difficulty.  She has not done any Lower body exercises, issued these today.     PT Next Visit Plan   Reformer  vs tower , manual prn, foster independence with HEP and learning how to set up and use Reformer safely, smoothly.     PT Home Exercise Plan  C AROM gentle , scap retraction and supine scapular stablization , bridging and anterior hip stretching     Consulted and Agree with Plan of Care  Patient       Patient will benefit from skilled therapeutic intervention in order to improve the following deficits and impairments:  Abnormal gait, Decreased endurance, Hypomobility, Impaired sensation, Obesity, Increased edema, Decreased activity tolerance, Decreased strength, Increased fascial restricitons, Impaired UE functional use, Pain, Difficulty walking, Decreased mobility, Decreased balance, Decreased range of motion, Improper body mechanics, Postural dysfunction, Impaired flexibility, Decreased coordination, Decreased safety awareness  Visit Diagnosis: Other abnormalities of gait and mobility  Cervicalgia  Muscle weakness (generalized)     Problem List Patient Active Problem List   Diagnosis Date Noted  . Cervical stenosis of spinal canal 10/23/2016  . Neck swelling 09/28/2011  . Weight gain 01/02/2011  . Carpal tunnel syndrome 11/02/2010  . Leg swelling 08/26/2010  . ADRENAL MASS, LEFT 05/23/2010  . SPINAL STENOSIS, LUMBAR 12/27/2009  . B12 DEFICIENCY 06/10/2009  . ANEMIA 06/10/2009  . BACK PAIN, LUMBAR 11/06/2008  . KNEE PAIN, LEFT 02/03/2008  . DIABETES MELLITUS, TYPE II, UNCONTROLLED 01/12/2008  . MENOPAUSE-RELATED VASOMOTOR SYMPTOMS, HOT FLASHES 06/23/2007  . BURSITIS, LEFT HIP 06/23/2007  . HYPERLIPIDEMIA 03/25/2007  . ANXIETY 03/25/2007  . DEPRESSION 03/25/2007  . PERIPHERAL NEUROPATHY 03/25/2007  . HYPERTENSION 03/25/2007  . CORONARY ARTERY DISEASE 03/25/2007  . DIVERTICULOSIS, COLON 03/25/2007  . TUBULOVILLOUS ADENOMA, COLON 02/18/2007    PAA,JENNIFER 04/14/2017, 1:04 PM  [facility] [address] [place], [name], [address] Phone: [phone]   Fax:  [phone]  Name: [name] Bean MRN: [id number] Date of Birth: 03/04/1951   Raeford Razor, PT 04/14/17 1:07 PM Phone: [phone] Fax: [phone]

## 2017-04-14 NOTE — Patient Instructions (Signed)
PELVIC TILT: Posterior    Tighten abdominals, flatten low back. ___ reps per set, ___ sets per day, ___ days per week   Copyright  VHI. All rights reserved.  Knee to Chest    Lying supine, bend involved knee to chest _3__ times. Repeat with other leg. Do __2_ times per day.  Copyright  VHI. All rights reserved.

## 2017-04-15 ENCOUNTER — Encounter: Payer: Self-pay | Admitting: Family Medicine

## 2017-04-15 ENCOUNTER — Ambulatory Visit: Payer: BC Managed Care – PPO | Admitting: Family Medicine

## 2017-04-15 VITALS — BP 128/60 | HR 76 | Temp 98.1°F | Wt 191.0 lb

## 2017-04-15 DIAGNOSIS — E1165 Type 2 diabetes mellitus with hyperglycemia: Secondary | ICD-10-CM

## 2017-04-15 DIAGNOSIS — Z794 Long term (current) use of insulin: Secondary | ICD-10-CM | POA: Diagnosis not present

## 2017-04-15 DIAGNOSIS — R110 Nausea: Secondary | ICD-10-CM

## 2017-04-15 DIAGNOSIS — F329 Major depressive disorder, single episode, unspecified: Secondary | ICD-10-CM

## 2017-04-15 DIAGNOSIS — K5900 Constipation, unspecified: Secondary | ICD-10-CM | POA: Diagnosis not present

## 2017-04-15 DIAGNOSIS — F419 Anxiety disorder, unspecified: Secondary | ICD-10-CM

## 2017-04-15 DIAGNOSIS — F32A Depression, unspecified: Secondary | ICD-10-CM

## 2017-04-15 LAB — GLUCOSE, POCT (MANUAL RESULT ENTRY): POC Glucose: 261 mg/dl — AB (ref 70–99)

## 2017-04-15 LAB — POCT GLYCOSYLATED HEMOGLOBIN (HGB A1C): HEMOGLOBIN A1C: 9.1

## 2017-04-15 MED ORDER — BASAGLAR KWIKPEN 100 UNIT/ML ~~LOC~~ SOPN: PEN_INJECTOR | SUBCUTANEOUS | 11 refills | Status: DC

## 2017-04-15 NOTE — Progress Notes (Signed)
Subjective:    Patient ID: [name] Bean, female    DOB: Jan 02, 1951, 67 y.o.   MRN: [id number]  No chief complaint on file.   HPI Patient was seen today for f/u on DM.  States has been checking FS BS in a.m. typically 180-190.  Patient taking 22 units nightly and NovoLog 12 units 3 times daily.  Patient is not on metformin as she read it can cause dementia.  Today patient states she feels nauseous, tired, with constipation, no appetite for the last 2 days.  Denies sick contacts.  Does endorse traveling from [place] to [place] and back down to [place] over the holiday break.  Patient states all she wants to do is sleep.  Patient feels like her mood is good.  She is been taking Lexapro 20 mg daily.  Past Medical History:  Diagnosis Date  . Anemia   . Anxiety   . Arthritis   . Asthma   . Broken arm    left arm  . Bronchitis   . Bursitis of left hip   . CAD (coronary artery disease)   . Candidiasis, vagina   . Cardiac arrest ([place]) 06/20/2016   at [facility]; ? due to flash pulm edema vs undertreated chronic CHF  . Cerumen impaction   . CHF (congestive heart failure) ([place])    RECENT ADMIT TO [facility]   . Colon, diverticulosis   . Depression   . Diabetes mellitus    15 YRS AGO  . Fatigue   . Gastroenteritis   . Hot flashes, menopausal   . Hyperlipidemia   . Knee pain, left   . Lumbar back pain   . Maxillary sinusitis    history  . Muscle tear    right gluteus  . Nausea   . Other B-complex deficiencies   . Otitis media, acute    left  . Peripheral neuropathy   . Spinal stenosis of lumbar region   . Tubulovillous adenoma of colon   . Unspecified essential hypertension     Allergies  Allergen Reactions  . Sitagliptin Other (See Comments)    Had pancreatitis while on Januvia     ROS General: Denies fever, chills, night sweats, changes in weight  + decreased appetite, tired HEENT: Denies headaches, ear pain, changes in vision, rhinorrhea, sore throat CV: Denies CP,  palpitations, SOB, orthopnea Pulm: Denies SOB, cough, wheezing GI: Denies abdominal pain, vomiting, diarrhea   constipation, nausea. GU: Denies dysuria, hematuria, frequency, vaginal discharge Msk: Denies muscle cramps, joint pains Neuro: Denies weakness, numbness, tingling Skin: Denies rashes, bruising Psych: Denies depression, anxiety, hallucinations     Objective:    Blood pressure 128/60, pulse 76, temperature 98.1 F (36.7 C), temperature source Oral, weight 191 lb (86.6 kg).   Gen. Pleasant, well-nourished, in no distress, normal affect   HEENT: [place]/AT, face symmetric, no scleral icterus, PERRLA, nares patent without drainage, pharynx without erythema or exudate. Lungs: no accessory muscle use, CTAB, no wheezes or rales Cardiovascular: RRR, no m/r/g, no peripheral edema Abdomen: BS present, soft, NT/ND. Musculoskeletal: No deformities, no cyanosis or clubbing, normal tone Neuro:  A&Ox3, CN II-XII intact, ambulates with cane Skin:  Warm, no lesions/ rash   Wt Readings from Last 3 Encounters:  04/15/17 191 lb (86.6 kg)  03/16/17 192 lb (87.1 kg)  03/09/17 187 lb 6.4 oz (85 kg)    Lab Results  Component Value Date   WBC 6.2 10/19/2016   HGB 11.8 (L) 10/19/2016   HCT 35.8 (L)  10/19/2016   PLT 218 10/19/2016   GLUCOSE 423 (H) 02/02/2017   CHOL 190 01/02/2011   TRIG 167.0 (H) 01/02/2011   HDL 52.30 01/02/2011   LDLCALC 104 (H) 01/02/2011   ALT 11 (L) 09/09/2016   AST 19 09/09/2016   NA 135 02/02/2017   K 3.7 02/02/2017   CL 99 (L) 02/02/2017   CREATININE 0.91 02/02/2017   BUN 17 02/02/2017   CO2 24 02/02/2017   TSH 0.981 10/28/2010   INR 1.0 RATIO 10/27/2007   HGBA1C 7.6 (H) 10/19/2016   MICROALBUR 0.67 01/02/2011    Assessment/Plan:  Type 2 diabetes mellitus with hyperglycemia, with long-term current use of insulin (HCC)  -fsbs elevated.  At home and in clinic. -fsbs 261, Hgb A1c 9.0 -will increase basaglar to 26 U - Plan: POCT glucose (manual entry),  POCT glycosylated hemoglobin (Hb A1C), Insulin Glargine (BASAGLAR KWIKPEN) 100 UNIT/ML SOPN -f/u in next 2-4 wks.  Will likely need to further increase insulin.  Constipation, unspecified constipation type -Discussed use of Miralax daily and increasing po intake of fiber and water.  Nausea -Discussed possible causes including Hyper/hypoglycemia, viral illness, liver/gallbladder issues. -Labs recommended, however pt declines at this time.  If no improvement in symptoms pt states she will return for further work up.  -f/u next wk if no improvement in symptoms.   Anxiety and depression -continue Lexapro 20 mg daily -Pt given info on area counselors. -will continue to monitor.    Grier Mitts, MD

## 2017-04-15 NOTE — Patient Instructions (Addendum)
You can take Miralax for your constipation.    Constipation, Adult Constipation is when a person:  Poops (has a bowel movement) fewer times in a week than normal.  Has a hard time pooping.  Has poop that is dry, hard, or bigger than normal.  Follow these instructions at home: Eating and drinking   Eat foods that have a lot of fiber, such as: ? Fresh fruits and vegetables. ? Whole grains. ? Beans.  Eat less of foods that are high in fat, low in fiber, or overly processed, such as: ? [place] fries. ? Hamburgers. ? Cookies. ? Candy. ? Soda.  Drink enough fluid to keep your pee (urine) clear or pale yellow. General instructions  Exercise regularly or as told by your doctor.  Go to the restroom when you feel like you need to poop. Do not hold it in.  Take over-the-counter and prescription medicines only as told by your doctor. These include any fiber supplements.  Do pelvic floor retraining exercises, such as: ? Doing deep breathing while relaxing your lower belly (abdomen). ? Relaxing your pelvic floor while pooping.  Watch your condition for any changes.  Keep all follow-up visits as told by your doctor. This is important. Contact a doctor if:  You have pain that gets worse.  You have a fever.  You have not pooped for 4 days.  You throw up (vomit).  You are not hungry.  You lose weight.  You are bleeding from the anus.  You have thin, pencil-like poop (stool). Get help right away if:  You have a fever, and your symptoms suddenly get worse.  You leak poop or have blood in your poop.  Your belly feels hard or bigger than normal (is bloated).  You have very bad belly pain.  You feel dizzy or you faint. This information is not intended to replace advice given to you by your health care provider. Make sure you discuss any questions you have with your health care provider. Document Released: 09/16/2007 Document Revised: 10/18/2015 Document Reviewed:  09/18/2015 Elsevier Interactive Patient Education  2018 [place] With Diabetes Diabetes (type 1 diabetes mellitus or type 2 diabetes mellitus) is a condition in which the body does not have enough of a hormone called insulin, or the body does not respond properly to insulin. Normally, insulin allows sugars (glucose) to enter cells in the body. The cells use glucose for energy. With diabetes, extra glucose builds up in the blood instead of going into cells, which results in high blood glucose (hyperglycemia). How to manage lifestyle changes Managing diabetes includes medical treatments as well as lifestyle changes. If diabetes is not managed well, serious physical and emotional complications can occur. Taking good care of yourself means that you are responsible for:  Monitoring glucose regularly.  Eating a healthy diet.  Exercising regularly.  Meeting with health care providers.  Taking medicines as directed.  Some people may feel a lot of stress about managing their diabetes. This is known as emotional distress, and it is very common. Living with diabetes can place you at risk for emotional distress, depression, or anxiety. These disorders can be confusing and can make diabetes management more difficult. How to recognize stress Emotional distress Symptoms of emotional distress include:  Anger about having a diagnosis of diabetes.  Fear or frustration about your diagnosis and the changes you need to make to manage the condition.  Being overly worried about the care that you need or the cost  of the care you need.  Feeling like you caused your condition by doing something wrong.  Fear of unpredictable situations, like low or high blood glucose.  Feeling judged by your health care providers.  Feeling very alone with the disease.  Getting too tired or "burned out" with the demands of daily care.  Depression Having diabetes means that you are at a higher risk for  depression. Having depression also means that you are at a higher risk for diabetes. Your health care provider may test (screen) you for symptoms of depression. It is important to recognize depression symptoms and to start treatment for it soon after it is diagnosed. The following are some symptoms of depression:  Loss of interest in things that you used to enjoy.  Trouble sleeping, or often waking up early and not being able to get back to sleep.  A change in appetite.  Feeling tired most of the day.  Feeling nervous and anxious.  Feeling guilty and worrying that you are a burden to others.  Feeling depressed more often than you do not feel that way.  Thoughts of hurting yourself or feeling that you want to die.  If you have any of these symptoms for 2 weeks or longer, reach out to a health care provider. Where to find support  Ask your health care provider to recommend a therapist who understands both depression and diabetes.  Search for information and support from the American Diabetes Association: www.diabetes.org  Find a certified diabetes educator and make an appointment through [place] [employer] Diabetes Educators: www.diabeteseducator.org Follow these instructions at home: Managing emotional distress The following are some ways to manage emotional distress:  Talk with your health care provider or certified diabetes educator. Consider working with a counselor or therapist.  Learn as much as you can about diabetes and its treatment. Meet with a certified diabetes educator or take a class to learn how to manage your condition.  Keep a journal of your thoughts and concerns.  Accept that some things are out of your control.  Talk with other people who have diabetes. It can help to talk with others about the emotional distress that you feel.  Find ways to manage stress that work for you. These may include art or music therapy, exercise, meditation, and  hobbies.  Seek support from spiritual leaders, family, and friends.  General instructions  Follow your diabetes management plan.  Keep all follow-up visits as told by your health care provider. This is important. Get help right away if:  You have thoughts about hurting yourself or others. If you ever feel like you may hurt yourself or others, or have thoughts about taking your own life, get help right away. You can go to your nearest emergency department or call:  Your local emergency services (911 in the [place]  A suicide crisis helpline, such as the [place] at (534)547-8566. This is open 24 hours a day.  Summary  Diabetes (type 1 diabetes mellitus or type 2 diabetes mellitus) is a condition in which the body does not have enough of a hormone called insulin, or the body does not respond properly to insulin.  Living with diabetes puts you at risk for medical issues, and it also puts you at risk for emotional issues such as emotional distress, depression, and anxiety.  Recognizing the symptoms of emotional distress and depression may help you avoid problems with your diabetes control. It is important to start treatment for emotional distress  and depression soon after they are diagnosed.  Having diabetes means that you are at a higher risk for depression. Ask your health care provider to recommend a therapist who understands both depression and diabetes.  If you experience symptoms of emotional distress or depression, it is important to discuss this with your health care provider, certified diabetes educator, or therapist. This information is not intended to replace advice given to you by your health care provider. Make sure you discuss any questions you have with your health care provider. Document Released: 08/13/2016 Document Revised: 08/13/2016 Document Reviewed: 08/13/2016 Elsevier Interactive Patient Education  2018 [employer].  Diabetes Mellitus  and Sick Day Management Blood sugar (glucose) can be difficult to control when you are sick. Common illnesses that can cause problems for people with diabetes (diabetes mellitus) include colds, fever, flu (influenza), nausea, vomiting, and diarrhea. These illnesses can cause stress and loss of body fluids (dehydration), and those issues can cause blood glucose levels to increase. Because of this, it is very important to take your insulin and diabetes medicines and eat some form of carbohydrate when you are sick. You should make a plan for days when you are sick (sick day plan) as part of your diabetes management plan. You and your health care provider should make this plan in advance. The following guidelines are intended to help you manage an illness that lasts for about 24 hours or less. Your health care provider may also give you more specific instructions. What do I need to do to manage my blood glucose?  Check your blood glucose every 2-4 hours, or as often as told by your health care provider.  Know your sick day treatment goals. Your target blood glucose levels may be different when you are sick.  If you use insulin, take your usual dose. ? If your blood glucose continues to be too high, you may need to take an additional insulin dose as told by your health care provider.  If you use oral diabetes medicine, you may need to stop taking it if you are not able to eat or drink normally. Ask your health care provider about whether you need to stop taking these medicines while you are sick.  If you use injectable hormone medicines other than insulin to control your diabetes, ask your health care provider about whether you need to stop taking these medicines while you are sick. What else can I do to manage my diabetes when I am sick? Check your ketones  If you have type 1 diabetes, check your urine ketones every 4 hours.  If you have type 2 diabetes, check your urine ketones as often as told by  your health care provider. Drink fluids  Drink enough fluid to keep your urine clear or pale yellow. This is especially important if you have a fever, vomiting, or diarrhea. Those symptoms can lead to dehydration.  Follow any instructions from your health care provider about beverages to avoid. ? Do not drink alcohol, caffeine, or drinks that contain a lot of sugar. Take medicines as directed  Take-over-the-counter and prescription medicines only as told by your health care provider.  Check medicine labels for added sugars. Some medicines may contain sugar or types of sugars that can raise your blood glucose level. What foods can I eat when I am sick? You need to eat some form of carbohydrates when you are sick. You should eat 45-50 grams (45-50 g) of carbohydrates every 3-4 hours until you feel better.  All of the food choices below contain about 15 g of carbohydrates. Plan ahead and keep some of these foods around so you have them if you get sick.  4-6 oz (120-177 mL) carbonated beverage that contains sugar, such as regular (not diet) soda. You may be able to drink carbonated beverages more easily if you open the beverage and let it sit at room temperature for a few minutes before drinking.   of a twin frozen ice pop.  4 oz (120 g) regular gelatin.  4 oz (120 mL) fruit juice.  4 oz (120 g) ice cream or frozen yogurt.  2 oz (60 g) sherbet.  8 oz (240 mL) clear broth or soup.  4 oz (120 g) regular custard.  4 oz (120 g) regular pudding.  8 oz (240 g) plain yogurt.  1 slice bread or toast.  6 saltine crackers.  5 vanilla wafers.  Questions to ask your health care provider Consider asking the following questions so you know what to do on days when you are sick:  Should I adjust my diabetes medicines?  How often do I need to check my blood glucose?  What supplies do I need to manage my diabetes at home when I am sick?  What number can I call if I have  questions?  What foods and drinks should I avoid?  Contact a health care provider if:  You develop symptoms of diabetic ketoacidosis, such as: ? Fatigue. ? Weight loss. ? Excessive thirst. ? Light-headedness. ? Fruity or sweet-smelling breath. ? Excessive urination. ? Vision changes. ? Confusion or irritability. ? Nausea. ? Vomiting. ? Rapid breathing. ? Pain in the abdomen. ? Feeling flushed.  You are unable to drink fluids without vomiting.  You have any of the following for more than 6 hours: ? Nausea. ? Vomiting. ? Diarrhea.  Your blood glucose is at or above 240 mg/dL (13.3 mmol/L), even after you take an additional insulin dose.  You have a change in how you think, feel, or act (mental status).  You develop another serious illness.  You have been sick or have had a fever for 2 days or longer and you are not getting better. Get help right away if:  Your blood glucose is lower than 54 mg/dL (3.0 mmol/L).  You have difficulty breathing.  You have moderate or high ketone levels in your urine.  You used emergency glucagon to treat low blood glucose. Summary  Blood sugar (glucose) can be difficult to control when you are sick. Common illnesses that can cause problems for people with diabetes (diabetes mellitus) include colds, fever, flu (influenza), nausea, vomiting, and diarrhea.  Illnesses can cause stress and loss of body fluids (dehydration), and those issues can cause blood glucose levels to increase.  Make a plan for days when you are sick (sick day plan) as part of your diabetes management plan. You and your health care provider should make this plan in advance.  It is very important to take your insulin and diabetes medicines and to eat some form of carbohydrate when you are sick.  Contact your health care provider if have problems managing your blood glucose levels when you are sick, or if you have been sick or had a fever for 2 days or longer and are  not getting better. This information is not intended to replace advice given to you by your health care provider. Make sure you discuss any questions you have with your health care provider. Document Released:  04/02/2003 Document Revised: 12/27/2015 Document Reviewed: 12/27/2015 Elsevier Interactive Patient Education  [employer].

## 2017-04-19 ENCOUNTER — Ambulatory Visit: Payer: BC Managed Care – PPO | Admitting: Physical Therapy

## 2017-04-19 ENCOUNTER — Encounter: Payer: Self-pay | Admitting: Physical Therapy

## 2017-04-19 DIAGNOSIS — R2689 Other abnormalities of gait and mobility: Secondary | ICD-10-CM

## 2017-04-19 DIAGNOSIS — M6281 Muscle weakness (generalized): Secondary | ICD-10-CM

## 2017-04-19 DIAGNOSIS — M542 Cervicalgia: Secondary | ICD-10-CM

## 2017-04-19 NOTE — Therapy (Signed)
[place], [name], [address] Phone: [phone]   Fax:  [phone]  Physical Therapy Treatment  Patient Details  Name: [name] Bean MRN: [id number] Date of Birth: Aug 26, 1950 Referring Provider: Dr. Ashok Pall    Encounter Date: 04/19/2017  PT End of Session - 04/19/17 1157    Visit Number  23    Number of Visits  33    Date for PT Re-Evaluation  04/29/17    PT Start Time  1150    PT Stop Time  1246    PT Time Calculation (min)  56 min    Activity Tolerance  Patient tolerated treatment well    Behavior During Therapy  [facility] for tasks assessed/performed       Past Medical History:  Diagnosis Date  . Anemia   . Anxiety   . Arthritis   . Asthma   . Broken arm    left arm  . Bronchitis   . Bursitis of left hip   . CAD (coronary artery disease)   . Candidiasis, vagina   . Cardiac arrest ([place]) 06/20/2016   at [facility]; ? due to flash pulm edema vs undertreated chronic CHF  . Cerumen impaction   . CHF (congestive heart failure) ([place])    RECENT ADMIT TO [facility]   . Colon, diverticulosis   . Depression   . Diabetes mellitus    15 YRS AGO  . Fatigue   . Gastroenteritis   . Hot flashes, menopausal   . Hyperlipidemia   . Knee pain, left   . Lumbar back pain   . Maxillary sinusitis    history  . Muscle tear    right gluteus  . Nausea   . Other B-complex deficiencies   . Otitis media, acute    left  . Peripheral neuropathy   . Spinal stenosis of lumbar region   . Tubulovillous adenoma of colon   . Unspecified essential hypertension     Past Surgical History:  Procedure Laterality Date  . CARDIAC CATHETERIZATION  2015, 2009, 2006  . CERVICAL SPINE SURGERY  2003  . CORONARY ANGIOPLASTY WITH STENT PLACEMENT  2002, 2015  . EYE SURGERY     cataracts bilaterally  . POSTERIOR CERVICAL LAMINECTOMY N/A 10/23/2016   Procedure: POSTERIOR CERVICAL LAMINECTOMY MULTI LEVEL CERVICAL TWO-  CERVICAL THREE, CERVICAL THREE- CERVICAL FOUR;  Surgeon: Ashok Pall, MD;  Location: [facility];  Service: Neurosurgery;  Laterality: N/A;  POSTERIOR    There were no vitals filed for this visit.  Subjective Assessment - 04/19/17 1154    Subjective  Neck hurts a bit today.  Same place.     Currently in Pain?  Yes    Pain Score  4     Pain Location  Neck    Pain Orientation  Right    Pain Descriptors / Indicators  Tightness    Pain Type  Chronic pain    Pain Onset  More than a month ago    Pain Frequency  Intermittent          [facility] Adult PT Treatment/Exercise - 04/19/17 0001      Lumbar Exercises: Stretches   Single Knee to Chest Stretch  2 reps;30 seconds other leg straight     Lower Trunk Rotation  10 seconds    Lower Trunk Rotation Limitations  x 10     Pelvic Tilt  10 seconds x 10 better !!       Lumbar Exercises:  Supine   Clam  10 reps    Bent Knee Raise  10 reps    Bridge  10 reps    Other Supine Lumbar Exercises  Pilates Reformer:See note       Moist Heat Therapy   Number Minutes Moist Heat  10 Minutes    Moist Heat Location  Cervical      Manual Therapy   Soft tissue mobilization  Rt. lateral cervicals, Rt. upper trap        Pilates Reformer used for LE/core strength, postural strength, lumbopelvic disassociation and core control.  Exercises included:  Footwork 2 Red 1 blue, used ball for LE alignment. Cues for maintaining pelvic tilt.   Supine Arm work 1 [employer] in parallel and T, manual A for table top leg position.   Seated Arms(on long box)  Facing back:  roll down 1 red   Row  Extension 1 blue  Single arm pull 1 blue cues for moving head with spine   Facing Front serving 1 blue      PT Education - 04/19/17 1247    Education provided  No       PT Short Term Goals - 03/29/17 1130      PT SHORT TERM GOAL #1   Title  Pt will complete balance assessment and understand results, set goal     Status  Achieved      PT SHORT TERM GOAL #2    Title  Pt will be I with simple HEP for cervical spine, UE strength an postuure     Status  Achieved      PT SHORT TERM GOAL #3   Title  Pt will report increased ability to stand from a sitting position and transfer in and out of the bed (25% improved)     Status  Achieved        PT Long Term Goals - 04/14/17 1258      PT LONG TERM GOAL #1   Title  Pt will be able to walk in the community as needed with only minimal increase in neck pain.     Status  Achieved      PT LONG TERM GOAL #2   Title  Pt will safely negotiate 12 or more stairs in the community with confidence and min to no fatigue, pain in hips.     Baseline  safe but fatigues, pain in hips     Status  Partially Met      PT LONG TERM GOAL #3   Title  Pt will improve Berg balance score by 6 points 48/56.     Status  Unable to assess      PT LONG TERM GOAL #4   Title  Pt will increase cervical AROM by 10 or more degrees in each direction, no lasting pain for improved driving, travel.     Baseline  needs to maintain, pain improved     Status  Partially Met      PT LONG TERM GOAL #5   Title  Pt will be able to use her LUE for a small portion (25%) of her ADLs due to increased ROM and less pain.     Status  On-going      PT LONG TERM GOAL #6   Title  Pt will transition to a community based fitness class/group.     Status  On-going      PT LONG TERM GOAL #7   Title  Pt will  get up from the floor unassisted as needed for safety with travel and at home.     Status  On-going            Plan - 04/19/17 1200    Clinical Impression Statement  Pt with improved pelvic tilt today.  Cues for LE alignment.  Pain in neck today, relieved with manual and MHP. Pt with significant stiffness in lateral flexion and what she feels like is scar tissue.       PT Treatment/Interventions  ADLs/Self Care Home Management;Patient/family education;DME Instruction;Gait training;Stair training;Cryotherapy;Electrical Stimulation;Moist  Heat;Ultrasound;Neuromuscular re-education;Passive range of motion;Balance training;Therapeutic exercise;Therapeutic activities;Manual techniques;Functional mobility training;Taping;Other (comment)    PT Next Visit Plan  re-visit her C AROM stretching, cont with UE AAROM, use Reformer    PT Home Exercise Plan  C AROM gentle , scap retraction and supine scapular stablization , bridging and anterior hip stretching     Consulted and Agree with Plan of Care  Patient       Patient will benefit from skilled therapeutic intervention in order to improve the following deficits and impairments:  Abnormal gait, Decreased endurance, Hypomobility, Impaired sensation, Obesity, Increased edema, Decreased activity tolerance, Decreased strength, Increased fascial restricitons, Impaired UE functional use, Pain, Difficulty walking, Decreased mobility, Decreased balance, Decreased range of motion, Improper body mechanics, Postural dysfunction, Impaired flexibility, Decreased coordination, Decreased safety awareness  Visit Diagnosis: Other abnormalities of gait and mobility  Cervicalgia  Muscle weakness (generalized)     Problem List Patient Active Problem List   Diagnosis Date Noted  . Cervical stenosis of spinal canal 10/23/2016  . Neck swelling 09/28/2011  . Weight gain 01/02/2011  . Carpal tunnel syndrome 11/02/2010  . Leg swelling 08/26/2010  . ADRENAL MASS, LEFT 05/23/2010  . SPINAL STENOSIS, LUMBAR 12/27/2009  . B12 DEFICIENCY 06/10/2009  . ANEMIA 06/10/2009  . BACK PAIN, LUMBAR 11/06/2008  . KNEE PAIN, LEFT 02/03/2008  . DIABETES MELLITUS, TYPE II, UNCONTROLLED 01/12/2008  . MENOPAUSE-RELATED VASOMOTOR SYMPTOMS, HOT FLASHES 06/23/2007  . BURSITIS, LEFT HIP 06/23/2007  . HYPERLIPIDEMIA 03/25/2007  . ANXIETY 03/25/2007  . DEPRESSION 03/25/2007  . PERIPHERAL NEUROPATHY 03/25/2007  . HYPERTENSION 03/25/2007  . CORONARY ARTERY DISEASE 03/25/2007  . DIVERTICULOSIS, COLON 03/25/2007  .  TUBULOVILLOUS ADENOMA, COLON 02/18/2007    Nettie Wyffels 04/19/2017, 12:53 PM  [facility] [address] [place], [name], [address] Phone: [phone]   Fax:  [phone]  Name: Kimmora Risenhoover MRN: [id number] Date of Birth: August 07, 1950   Raeford Razor, PT 04/19/17 1:03 PM Phone: [phone] Fax: [phone]

## 2017-04-21 ENCOUNTER — Ambulatory Visit: Payer: BC Managed Care – PPO | Admitting: Physical Therapy

## 2017-04-21 ENCOUNTER — Encounter: Payer: Self-pay | Admitting: Physical Therapy

## 2017-04-21 DIAGNOSIS — R2689 Other abnormalities of gait and mobility: Secondary | ICD-10-CM | POA: Diagnosis not present

## 2017-04-21 DIAGNOSIS — M6281 Muscle weakness (generalized): Secondary | ICD-10-CM

## 2017-04-21 DIAGNOSIS — M542 Cervicalgia: Secondary | ICD-10-CM

## 2017-04-21 NOTE — Therapy (Signed)
[place], [name], [address] Phone: [phone]   Fax:  [phone]  Physical Therapy Treatment  Patient Details  Name: [name] Bean MRN: [id number] Date of Birth: 1950-08-12 Referring Provider: Dr. Ashok Pall    Encounter Date: 04/21/2017  PT End of Session - 04/21/17 1243    Visit Number  24    Number of Visits  33    Date for PT Re-Evaluation  04/29/17    PT Start Time  1152 7 minutes late     PT Stop Time  1230    PT Time Calculation (min)  38 min       Past Medical History:  Diagnosis Date  . Anemia   . Anxiety   . Arthritis   . Asthma   . Broken arm    left arm  . Bronchitis   . Bursitis of left hip   . CAD (coronary artery disease)   . Candidiasis, vagina   . Cardiac arrest ([place]) 06/20/2016   at [facility]; ? due to flash pulm edema vs undertreated chronic CHF  . Cerumen impaction   . CHF (congestive heart failure) ([place])    RECENT ADMIT TO [facility]   . Colon, diverticulosis   . Depression   . Diabetes mellitus    15 YRS AGO  . Fatigue   . Gastroenteritis   . Hot flashes, menopausal   . Hyperlipidemia   . Knee pain, left   . Lumbar back pain   . Maxillary sinusitis    history  . Muscle tear    right gluteus  . Nausea   . Other B-complex deficiencies   . Otitis media, acute    left  . Peripheral neuropathy   . Spinal stenosis of lumbar region   . Tubulovillous adenoma of colon   . Unspecified essential hypertension     Past Surgical History:  Procedure Laterality Date  . CARDIAC CATHETERIZATION  2015, 2009, 2006  . CERVICAL SPINE SURGERY  2003  . CORONARY ANGIOPLASTY WITH STENT PLACEMENT  2002, 2015  . EYE SURGERY     cataracts bilaterally  . POSTERIOR CERVICAL LAMINECTOMY N/A 10/23/2016   Procedure: POSTERIOR CERVICAL LAMINECTOMY MULTI LEVEL CERVICAL TWO- CERVICAL THREE, CERVICAL THREE- CERVICAL FOUR;  Surgeon: Ashok Pall, MD;  Location: [place];  Service:  Neurosurgery;  Laterality: N/A;  POSTERIOR    There were no vitals filed for this visit.  Subjective Assessment - 04/21/17 1156    Subjective  Neck is much better today, slept well.     Currently in Pain?  Yes    Pain Score  2     Pain Location  Neck    Pain Orientation  Right    Pain Descriptors / Indicators  Tightness    Aggravating Factors   not arranging pillows right at night.     Pain Relieving Factors  getting pillows just right                       [facility] Adult PT Treatment/Exercise - 04/21/17 0001      Lumbar Exercises: Supine   Other Supine Lumbar Exercises  Reformer: foot work on heel toes, neutral and ER, heel raises, prancing  bridge with ball, supine arms  1 red, extensions 10 x 2, T 10 x 2, Seated arms facing back 1 blue 1 red, rows and extension 10 x 2, facing forward serving 1 blue  PT Short Term Goals - 03/29/17 1130      PT SHORT TERM GOAL #1   Title  Pt will complete balance assessment and understand results, set goal     Status  Achieved      PT SHORT TERM GOAL #2   Title  Pt will be I with simple HEP for cervical spine, UE strength an postuure     Status  Achieved      PT SHORT TERM GOAL #3   Title  Pt will report increased ability to stand from a sitting position and transfer in and out of the bed (25% improved)     Status  Achieved        PT Long Term Goals - 04/14/17 1258      PT LONG TERM GOAL #1   Title  Pt will be able to walk in the community as needed with only minimal increase in neck pain.     Status  Achieved      PT LONG TERM GOAL #2   Title  Pt will safely negotiate 12 or more stairs in the community with confidence and min to no fatigue, pain in hips.     Baseline  safe but fatigues, pain in hips     Status  Partially Met      PT LONG TERM GOAL #3   Title  Pt will improve Berg balance score by 6 points 48/56.     Status  Unable to assess      PT LONG TERM GOAL #4   Title  Pt will increase  cervical AROM by 10 or more degrees in each direction, no lasting pain for improved driving, travel.     Baseline  needs to maintain, pain improved     Status  Partially Met      PT LONG TERM GOAL #5   Title  Pt will be able to use her LUE for a small portion (25%) of her ADLs due to increased ROM and less pain.     Status  On-going      PT LONG TERM GOAL #6   Title  Pt will transition to a community based fitness class/group.     Status  On-going      PT LONG TERM GOAL #7   Title  Pt will get up from the floor unassisted as needed for safety with travel and at home.     Status  On-going            Plan - 04/21/17 1234    Clinical Impression Statement  Less neck pain. She reports how she sleeps at night impacts the neck pain she has daily. She demonstrates improved ability to maintain table top on reformer without assist. Increased reps tolerated. She wants to practice getting up off the floor and she is willing to try prone exercise on long box/reformer. Will start with those next visit.     PT Next Visit Plan  re-visit her C AROM stretching, cont with UE AAROM, use Reformer-try prone, practice getting up from floor     PT Home Exercise Plan  C AROM gentle , scap retraction and supine scapular stablization , bridging and anterior hip stretching     Consulted and Agree with Plan of Care  Patient       Patient will benefit from skilled therapeutic intervention in order to improve the following deficits and impairments:  Abnormal gait, Decreased endurance, Hypomobility, Impaired sensation, Obesity, Increased edema, Decreased  activity tolerance, Decreased strength, Increased fascial restricitons, Impaired UE functional use, Pain, Difficulty walking, Decreased mobility, Decreased balance, Decreased range of motion, Improper body mechanics, Postural dysfunction, Impaired flexibility, Decreased coordination, Decreased safety awareness  Visit Diagnosis: Other abnormalities of gait and  mobility  Cervicalgia  Muscle weakness (generalized)     Problem List Patient Active Problem List   Diagnosis Date Noted  . Cervical stenosis of spinal canal 10/23/2016  . Neck swelling 09/28/2011  . Weight gain 01/02/2011  . Carpal tunnel syndrome 11/02/2010  . Leg swelling 08/26/2010  . ADRENAL MASS, LEFT 05/23/2010  . SPINAL STENOSIS, LUMBAR 12/27/2009  . B12 DEFICIENCY 06/10/2009  . ANEMIA 06/10/2009  . BACK PAIN, LUMBAR 11/06/2008  . KNEE PAIN, LEFT 02/03/2008  . DIABETES MELLITUS, TYPE II, UNCONTROLLED 01/12/2008  . MENOPAUSE-RELATED VASOMOTOR SYMPTOMS, HOT FLASHES 06/23/2007  . BURSITIS, LEFT HIP 06/23/2007  . HYPERLIPIDEMIA 03/25/2007  . ANXIETY 03/25/2007  . DEPRESSION 03/25/2007  . PERIPHERAL NEUROPATHY 03/25/2007  . HYPERTENSION 03/25/2007  . CORONARY ARTERY DISEASE 03/25/2007  . DIVERTICULOSIS, COLON 03/25/2007  . TUBULOVILLOUS ADENOMA, COLON 02/18/2007    Dorene Ar, PTA 04/21/2017, 12:50 PM  [facility] [address] [place], [name], [address] Phone: [phone]   Fax:  [phone]  Name: Gerene Nedd MRN: [id number] Date of Birth: 1950/11/26

## 2017-04-26 ENCOUNTER — Ambulatory Visit: Payer: BC Managed Care – PPO | Admitting: Physical Therapy

## 2017-04-28 ENCOUNTER — Ambulatory Visit: Payer: BC Managed Care – PPO | Admitting: Physical Therapy

## 2017-04-29 ENCOUNTER — Ambulatory Visit: Payer: BC Managed Care – PPO | Admitting: Physical Therapy

## 2017-04-29 ENCOUNTER — Encounter: Payer: Self-pay | Admitting: Physical Therapy

## 2017-04-29 DIAGNOSIS — M6281 Muscle weakness (generalized): Secondary | ICD-10-CM

## 2017-04-29 DIAGNOSIS — M542 Cervicalgia: Secondary | ICD-10-CM

## 2017-04-29 DIAGNOSIS — R2689 Other abnormalities of gait and mobility: Secondary | ICD-10-CM | POA: Diagnosis not present

## 2017-04-29 NOTE — Therapy (Signed)
[place], [name], [address] Phone: [phone]   Fax:  [phone]  Physical Therapy Treatment  Patient Details  Name: Quiana Cobaugh MRN: [id number] Date of Birth: 06-19-1950 Referring Provider: Dr. Ashok Pall    Encounter Date: 04/29/2017  PT End of Session - 04/29/17 1437    Visit Number  25    Date for PT Re-Evaluation  04/29/17    PT Start Time  1420    PT Stop Time  1501    PT Time Calculation (min)  41 min    Activity Tolerance  Patient tolerated treatment well    Behavior During Therapy  [facility] for tasks assessed/performed       Past Medical History:  Diagnosis Date  . Anemia   . Anxiety   . Arthritis   . Asthma   . Broken arm    left arm  . Bronchitis   . Bursitis of left hip   . CAD (coronary artery disease)   . Candidiasis, vagina   . Cardiac arrest ([place]) 06/20/2016   at [facility]; ? due to flash pulm edema vs undertreated chronic CHF  . Cerumen impaction   . CHF (congestive heart failure) ([place])    RECENT ADMIT TO [facility]   . Colon, diverticulosis   . Depression   . Diabetes mellitus    15 YRS AGO  . Fatigue   . Gastroenteritis   . Hot flashes, menopausal   . Hyperlipidemia   . Knee pain, left   . Lumbar back pain   . Maxillary sinusitis    history  . Muscle tear    right gluteus  . Nausea   . Other B-complex deficiencies   . Otitis media, acute    left  . Peripheral neuropathy   . Spinal stenosis of lumbar region   . Tubulovillous adenoma of colon   . Unspecified essential hypertension     Past Surgical History:  Procedure Laterality Date  . CARDIAC CATHETERIZATION  2015, 2009, 2006  . CERVICAL SPINE SURGERY  2003  . CORONARY ANGIOPLASTY WITH STENT PLACEMENT  2002, 2015  . EYE SURGERY     cataracts bilaterally  . POSTERIOR CERVICAL LAMINECTOMY N/A 10/23/2016   Procedure: POSTERIOR CERVICAL LAMINECTOMY MULTI LEVEL CERVICAL TWO- CERVICAL THREE, CERVICAL THREE-  CERVICAL FOUR;  Surgeon: Ashok Pall, MD;  Location: [place];  Service: Neurosurgery;  Laterality: N/A;  POSTERIOR    There were no vitals filed for this visit.  Subjective Assessment - 04/29/17 1423    Subjective  Was getting out of the car the other day.  Back hurts and has been hurting.    Currently in Pain?  Yes    Pain Score  5  i know its there.     Pain Location  Back    Pain Orientation  Right;Left;Lower    Pain Type  Acute pain    Pain Onset  In the past 7 days    Pain Frequency  Intermittent    Aggravating Factors   getting out of the car     Pain Relieving Factors  heat           [facility] Adult PT Treatment/Exercise - 04/29/17 0001      Transfers   Transfers  Floor to Transfer    Floor to Transfer  2: Max assist;1: +2 Total assist    Floor to Transfer Details (indicate cue type and reason)  -- max verbal  Comments  unable to come up into quadruped due to UE weakness and LE /wrist       Therapeutic Activites    Therapeutic Activities  Other Therapeutic Activities    Other Therapeutic Activities  worked on gait and endurance/ able to do for 3 min, fatigued.  Having a 5 min walk test next week anf she wanted to prepare.       Lumbar Exercises: Stretches   Single Knee to Chest Stretch  3 reps;30 seconds    Lower Trunk Rotation  10 seconds    Lower Trunk Rotation Limitations  x 10     Hip Flexor Stretch  3 reps off table       Knee/Hip Exercises: Stretches   Active Hamstring Stretch  3 reps;30 seconds      Moist Heat Therapy   Number Minutes Moist Heat  15 Minutes    Moist Heat Location  Lumbar Spine               PT Short Term Goals - 04/29/17 1509      PT SHORT TERM GOAL #1   Title  Pt will complete balance assessment and understand results, set goal     Status  Achieved      PT SHORT TERM GOAL #2   Title  Pt will be I with simple HEP for cervical spine, UE strength an postuure     Status  Achieved      PT SHORT TERM GOAL #3   Title  Pt will  report increased ability to stand from a sitting position and transfer in and out of the bed (25% improved)     Status  Achieved        PT Long Term Goals - 04/29/17 1509      PT LONG TERM GOAL #1   Title  Pt will be able to walk in the community as needed with only minimal increase in neck pain.     Status  Achieved      PT LONG TERM GOAL #2   Title  Pt will safely negotiate 12 or more stairs in the community with confidence and min to no fatigue, pain in hips.     Baseline  safe but fatigues, pain in hips     Status  Partially Met      PT LONG TERM GOAL #3   Title  Pt will improve Berg balance score by 6 points 48/56.     Status  On-going      PT LONG TERM GOAL #4   Title  Pt will increase cervical AROM by 10 or more degrees in each direction, no lasting pain for improved driving, travel.     Baseline  needs to maintain, pain improved     Status  Achieved      PT LONG TERM GOAL #5   Title  Pt will be able to use her LUE for a small portion (25%) of her ADLs due to increased ROM and less pain.     Status  Partially Met      PT LONG TERM GOAL #6   Title  Pt will transition to a community based fitness class/group.     Status  Partially Met      PT LONG TERM GOAL #7   Title  Pt will get up from the floor unassisted as needed for safety with travel and at home.     Status  Not Met  Plan - 04/29/17 1513    Clinical Impression Statement  Patient without neck pain today, had increased back pain due to a random move as she transferred out of the car. She was unable to get up from the floor due to lack of upper body strength, hip tightness and poor weightbearing through wrist.  She plans to come to beginner Pilates class in February and pulmonary rehab.     PT Next Visit Plan  NA     PT Home Exercise Plan  C AROM gentle , scap retraction and supine scapular stablization , bridging and anterior hip stretching     Consulted and Agree with Plan of Care  Patient        Patient will benefit from skilled therapeutic intervention in order to improve the following deficits and impairments:  Abnormal gait, Decreased endurance, Hypomobility, Impaired sensation, Obesity, Increased edema, Decreased activity tolerance, Decreased strength, Increased fascial restricitons, Impaired UE functional use, Pain, Difficulty walking, Decreased mobility, Decreased balance, Decreased range of motion, Improper body mechanics, Postural dysfunction, Impaired flexibility, Decreased coordination, Decreased safety awareness  Visit Diagnosis: Other abnormalities of gait and mobility  Cervicalgia  Muscle weakness (generalized)     Problem List Patient Active Problem List   Diagnosis Date Noted  . Cervical stenosis of spinal canal 10/23/2016  . Neck swelling 09/28/2011  . Weight gain 01/02/2011  . Carpal tunnel syndrome 11/02/2010  . Leg swelling 08/26/2010  . ADRENAL MASS, LEFT 05/23/2010  . SPINAL STENOSIS, LUMBAR 12/27/2009  . B12 DEFICIENCY 06/10/2009  . ANEMIA 06/10/2009  . BACK PAIN, LUMBAR 11/06/2008  . KNEE PAIN, LEFT 02/03/2008  . DIABETES MELLITUS, TYPE II, UNCONTROLLED 01/12/2008  . MENOPAUSE-RELATED VASOMOTOR SYMPTOMS, HOT FLASHES 06/23/2007  . BURSITIS, LEFT HIP 06/23/2007  . HYPERLIPIDEMIA 03/25/2007  . ANXIETY 03/25/2007  . DEPRESSION 03/25/2007  . PERIPHERAL NEUROPATHY 03/25/2007  . HYPERTENSION 03/25/2007  . CORONARY ARTERY DISEASE 03/25/2007  . DIVERTICULOSIS, COLON 03/25/2007  . TUBULOVILLOUS ADENOMA, COLON 02/18/2007    Joseluis Alessio 04/29/2017, 3:27 PM  [facility] [address] [place], [name], [address] Phone: [phone]   Fax:  [phone]  Name: Angelisse Riso MRN: [id number] Date of Birth: Oct 24, 1950   PHYSICAL THERAPY DISCHARGE SUMMARY  Visits from Start of Care: 25  Current functional level related to goals / functional outcomes: See above for goals    Remaining  deficits: Balance, gait, ataxia/coodination, UE strength, core strength, hip ROM    Education / Equipment: HEP, posture, balance,Pilates  Plan: Patient agrees to discharge.  Patient goals were partially met. Patient is being discharged due to being pleased with the current functional level.  ?????    Plateau of progress.    Raeford Razor, PT 04/29/17 3:39 PM Phone: [phone] Fax: [phone]

## 2017-05-03 ENCOUNTER — Encounter: Payer: BC Managed Care – PPO | Admitting: Physical Therapy

## 2017-05-05 ENCOUNTER — Encounter: Payer: BC Managed Care – PPO | Admitting: Physical Therapy

## 2017-05-10 ENCOUNTER — Encounter (HOSPITAL_COMMUNITY)
Admission: RE | Admit: 2017-05-10 | Discharge: 2017-05-10 | Disposition: A | Discharge status: Home / Self Care | Payer: BC Managed Care – PPO | Source: Ambulatory Visit | Attending: Internal Medicine | Admitting: Internal Medicine

## 2017-05-10 ENCOUNTER — Encounter: Payer: BC Managed Care – PPO | Admitting: Physical Therapy

## 2017-05-10 ENCOUNTER — Encounter (HOSPITAL_COMMUNITY): Payer: Self-pay

## 2017-05-10 VITALS — BP 121/57 | HR 79 | Resp 18 | Ht 62.0 in | Wt 193.3 lb

## 2017-05-10 DIAGNOSIS — I5022 Chronic systolic (congestive) heart failure: Secondary | ICD-10-CM | POA: Diagnosis not present

## 2017-05-10 NOTE — Progress Notes (Signed)
[name] Bean 67 y.o. female Pulmonary Rehab Orientation Note Patient arrived today in Cardiac and Pulmonary Rehab for orientation to Pulmonary Rehab. She was transported from [employer] via wheel chair. She has not been prescribed oxygen for home or portable use. Color good, skin warm and dry. Patient is oriented to time and place. Patient's medical history, psychosocial health, and medications reviewed. Psychosocial assessment reveals pt lives alone. Pt is currently unemployed, disabled. Pt hobbies include spending time with friends and pilates. Pt reports her stress level is moderate. Areas of stress/anxiety include Health. She feels she needs to be in the best health possible so she can move back to [place] to be closer to her family and help take care of her mother with alzheimers. She exhibits some signs of depressions. Signs of depression include sadness and fatigue. PHQ2/9 score 2/6. Pt shows good  coping skills with positive outlook. She is offered emotional support and reassurance. Will continue to monitor and evaluate progress toward psychosocial goal(s) of overcoming any emotional barriers to participation in pulmonary rehab. Physical assessment reveals heart rate is normal, breath sounds clear to auscultation, no wheezes, rales, or rhonchi. Grip strength equal, strong. Distal pulses palpable. No edema noted in lower extremities.Patient reports she does take medications as prescribed. Patient states she follows a Regular diet. The patient reports no specific efforts to gain or lose weight.. Patient's weight will be monitored closely. Demonstration and practice of PLB using pulse oximeter. Patient able to return demonstration satisfactorily. Safety and hand hygiene in the exercise area reviewed with patient. Patient voices understanding of the information reviewed. Department expectations discussed with patient and achievable goals were set. The patient shows enthusiasm about  attending the program and we look forward to working with this nice lady. The patient is scheduled for a 6 min walk test on 05/13/17 and to begin exercise on 05/20/17.   45 minutes was spent on a variety of activities such as assessment of the patient, obtaining baseline data including height, weight, BMI, and grip strength, verifying medical history, allergies, and current medications, and teaching patient strategies for performing tasks with less respiratory effort with emphasis on pursed lip breathing.

## 2017-05-12 ENCOUNTER — Other Ambulatory Visit: Payer: Self-pay | Admitting: Neurosurgery

## 2017-05-12 ENCOUNTER — Encounter: Payer: BC Managed Care – PPO | Admitting: Physical Therapy

## 2017-05-12 DIAGNOSIS — M4712 Other spondylosis with myelopathy, cervical region: Secondary | ICD-10-CM

## 2017-05-13 ENCOUNTER — Encounter (HOSPITAL_COMMUNITY)
Admission: RE | Admit: 2017-05-13 | Discharge: 2017-05-13 | Disposition: A | Discharge status: Home / Self Care | Payer: BC Managed Care – PPO | Source: Ambulatory Visit | Attending: Internal Medicine | Admitting: Internal Medicine

## 2017-05-13 DIAGNOSIS — I5022 Chronic systolic (congestive) heart failure: Secondary | ICD-10-CM | POA: Diagnosis not present

## 2017-05-17 NOTE — Progress Notes (Signed)
Pulmonary Individual Treatment Plan  Patient Details  Name: [name] Bean MRN: [id number] Date of Birth: [birth year] Referring Provider:     Pulmonary Rehab Walk Test from 05/13/2017 in [place]  Referring Provider  Dr. Haroldine Laws      Initial Encounter Date:    Pulmonary Rehab Walk Test from 05/13/2017 in [place]  Date  05/17/17  Referring Provider  Dr. Haroldine Laws      Visit Diagnosis: Chronic systolic HF (heart failure) ([place] [facility])  Patient's Home Medications on Admission:   Current Outpatient Medications:  .  aspirin EC 81 MG tablet, Take 81 mg by mouth daily. , Disp: , Rfl:  .  BD PEN NEEDLE NANO U/F 32G X 4 MM MISC, USE TO INJECT INSULIN 5 TIMES A DAY, Disp: 100 each, Rfl: 5 .  bimatoprost (LUMIGAN) 0.01 % SOLN, Lumigan 0.01 % eye drops  PUT 1 DROP INTO BOTH EYES AT BEDTIME, Disp: , Rfl:  .  Blood Glucose Monitoring Suppl (ACCU-CHEK COMPACT CARE KIT) KIT, Accu-Chek Compact Plus Care kit  USE AS INSTRUCTED., Disp: , Rfl:  .  carvedilol (COREG) 25 MG tablet, Take 1 tablet (25 mg total) by mouth 2 (two) times daily with a meal., Disp: 60 tablet, Rfl: 6 .  cyclobenzaprine (FLEXERIL) 5 MG tablet, cyclobenzaprine 5 mg tablet  TAKE 1 TABLET (5 MG TOTAL) BY MOUTH 3 (THREE) TIMES DAILY AS NEEDED FOR MUSCLE SPASMS., Disp: , Rfl:  .  dexamethasone (DECADRON) 4 MG tablet, dexamethasone 4 mg tablet, Disp: , Rfl:  .  escitalopram (LEXAPRO) 20 MG tablet, Take 1 tablet (20 mg total) by mouth daily., Disp: 90 tablet, Rfl: 3 .  glucose blood (FREESTYLE TEST STRIPS) test strip, 1 each by Other route daily. DX E11.9, Disp: 100 each, Rfl: 12 .  hydrALAZINE (APRESOLINE) 50 MG tablet, Take 50-100 mg by mouth See admin instructions. Take 100 mg by mouth in the morning and take 50 mg by mouth in the evening, Disp: , Rfl:  .  insulin aspart (NOVOLOG) 100 UNIT/ML injection, Inject 12 Units into the skin 3 (three) times daily before meals.,  Disp: 10 mL, Rfl: 11 .  Insulin Glargine (BASAGLAR KWIKPEN) 100 UNIT/ML SOPN, INJECT 26 UNITS SUBCUTANEOUSLY NIGHTLY, Disp: 15 mL, Rfl: 11 .  latanoprost (XALATAN) 0.005 % ophthalmic solution, Place 1 drop into both eyes at bedtime., Disp: , Rfl:  .  losartan (COZAAR) 100 MG tablet, Take 100 mg by mouth daily., Disp: , Rfl:  .  Melatonin 3 MG TABS, Take 3 mg by mouth at bedtime as needed (sleep)., Disp: , Rfl:  .  rosuvastatin (CRESTOR) 20 MG tablet, Take 20 mg by mouth daily., Disp: , Rfl:  .  spironolactone (ALDACTONE) 25 MG tablet, Take 25 mg by mouth daily., Disp: , Rfl:  .  torsemide (DEMADEX) 20 MG tablet, Take 20-40 mg by mouth See admin instructions. Take 20 mg by mouth daily and may take 20-40 mg by mouth as needed for swelling, Disp: , Rfl:  .  varenicline (CHANTIX CONTINUING MONTH PAK) 1 MG tablet, Take 1 tablet (1 mg total) by mouth 2 (two) times daily., Disp: 60 tablet, Rfl: 6  Past Medical History: Past Medical History:  Diagnosis Date  . Anemia   . Anxiety   . Arthritis   . Asthma   . Broken arm    left arm  . Bronchitis   . Bursitis of left hip   . CAD (coronary artery disease)   .  Candidiasis, vagina   . Cardiac arrest ([place]) 06/20/2016   at [facility]; ? due to flash pulm edema vs undertreated chronic CHF  . Cerumen impaction   . CHF (congestive heart failure) ([place])    RECENT ADMIT TO [facility]   . Colon, diverticulosis   . Depression   . Diabetes mellitus    15 YRS AGO  . Fatigue   . Gastroenteritis   . Hot flashes, menopausal   . Hyperlipidemia   . Knee pain, left   . Lumbar back pain   . Maxillary sinusitis    history  . Muscle tear    right gluteus  . Nausea   . Other B-complex deficiencies   . Otitis media, acute    left  . Peripheral neuropathy   . Spinal stenosis of lumbar region   . Tubulovillous adenoma of colon   . Unspecified essential hypertension     Tobacco Use: Social History   Tobacco Use  Smoking Status Current Every Day Smoker  .  Packs/day: 0.25  . Years: 4.00  . Pack years: 1.00  Smokeless Tobacco Never Used  Tobacco Comment   smokes two a day    Labs: Recent Review Flowsheet Data    Labs for ITP Cardiac and Pulmonary Rehab Latest Ref Rng & Units 10/28/2010 01/02/2011 09/09/2016 10/19/2016 04/15/2017   Cholestrol 0 - 200 mg/dL - 190 - - -   LDLCALC 0 - 99 mg/dL - 104(H) - - -   HDL >39.00 mg/dL - 52.30 - - -   Trlycerides 0.0 - 149.0 mg/dL - 167.0(H) - - -   Hemoglobin A1c - 8.4(H) 8.1(H) 8.1(H) 7.6(H) 9.1      Capillary Blood Glucose: Lab Results  Component Value Date   GLUCAP 222 (H) 10/24/2016   GLUCAP 269 (H) 10/23/2016   GLUCAP 289 (H) 10/23/2016   GLUCAP 242 (H) 10/23/2016   GLUCAP 242 (H) 10/23/2016     Pulmonary Assessment Scores:   Pulmonary Function Assessment: Pulmonary Function Assessment - 05/10/17 1036      Breath   Bilateral Breath Sounds  Clear    Shortness of Breath  Yes on occasion       Exercise Target Goals: Date: 05/17/17  Exercise Program Goal: Individual exercise prescription set using results from initial 6 min walk test and THRR while considering  patient's activity barriers and safety.    Exercise Prescription Goal: Initial exercise prescription builds to 30-45 minutes a day of aerobic activity, 2-3 days per week.  Home exercise guidelines will be given to patient during program as part of exercise prescription that the participant will acknowledge.  Activity Barriers & Risk Stratification: Activity Barriers & Cardiac Risk Stratification - 05/10/17 1021      Activity Barriers & Cardiac Risk Stratification   Activity Barriers  Neck/Spine Problems;Back Problems;Deconditioning;Assistive Device;History of Falls;Balance Concerns       6 Minute Walk: 6 Minute Walk    Row Name 05/17/17 0708         6 Minute Walk   Phase  Initial     Distance  710 feet     Walk Time  6 minutes     # of Rest Breaks  0     MPH  1.34     METS  2     RPE  13     Perceived  Dyspnea   0     Symptoms  Yes (comment)     Comments  used wheelchair, calf and hip pain-patient  stated from stiffness     Resting HR  84 bpm     Resting BP  140/77     Resting Oxygen Saturation   97 %     Exercise Oxygen Saturation  during 6 min walk  97 %     Max Ex. HR  118 bpm     Max Ex. BP  143/76       Interval HR   1 Minute HR  94     2 Minute HR  86     3 Minute HR  104     4 Minute HR  110     5 Minute HR  118     6 Minute HR  101     2 Minute Post HR  95     Interval Heart Rate?  Yes       Interval Oxygen   Interval Oxygen?  Yes     Baseline Oxygen Saturation %  97 %     1 Minute Oxygen Saturation %  98 %     1 Minute Liters of Oxygen  0 L     2 Minute Oxygen Saturation %  98 %     2 Minute Liters of Oxygen  0 L     3 Minute Oxygen Saturation %  97 %     3 Minute Liters of Oxygen  0 L     4 Minute Oxygen Saturation %  98 %     4 Minute Liters of Oxygen  0 L     5 Minute Oxygen Saturation %  98 %     5 Minute Liters of Oxygen  0 L     6 Minute Oxygen Saturation %  99 %     6 Minute Liters of Oxygen  0 L     2 Minute Post Oxygen Saturation %  97 %     2 Minute Post Liters of Oxygen  0 L        Oxygen Initial Assessment: Oxygen Initial Assessment - 05/17/17 0708      Initial 6 min Walk   Oxygen Used  None      Program Oxygen Prescription   Program Oxygen Prescription  None       Oxygen Re-Evaluation:   Oxygen Discharge (Final Oxygen Re-Evaluation):   Initial Exercise Prescription: Initial Exercise Prescription - 05/17/17 0700      Date of Initial Exercise RX and Referring Provider   Date  05/17/17    Referring Provider  Dr. Haroldine Laws      Recumbant Bike   Level  2    Watts  20    Minutes  17      NuStep   Level  2    SPM  80    Minutes  17      Track   Laps  5    Minutes  17      Prescription Details   Frequency (times per week)  2    Duration  Progress to 45 minutes of aerobic exercise without signs/symptoms of physical distress       Intensity   THRR 40-80% of Max Heartrate  62-125    Ratings of Perceived Exertion  11-13    Perceived Dyspnea  0-4      Progression   Progression  Continue progressive overload as per policy without signs/symptoms or physical distress.      Resistance Training   Training Prescription  Yes  Weight  orange bands    Reps  10-15       Perform Capillary Blood Glucose checks as needed.  Exercise Prescription Changes:   Exercise Comments:   Exercise Goals and Review: Exercise Goals    Row Name 05/10/17 1022             Exercise Goals   Increase Physical Activity  Yes       Intervention  Provide advice, education, support and counseling about physical activity/exercise needs.;Develop an individualized exercise prescription for aerobic and resistive training based on initial evaluation findings, risk stratification, comorbidities and participant's personal goals.       Expected Outcomes  Short Term: Attend rehab on a regular basis to increase amount of physical activity.;Long Term: Add in home exercise to make exercise part of routine and to increase amount of physical activity.;Long Term: Exercising regularly at least 3-5 days a week.       Increase Strength and Stamina  Yes       Intervention  Provide advice, education, support and counseling about physical activity/exercise needs.;Develop an individualized exercise prescription for aerobic and resistive training based on initial evaluation findings, risk stratification, comorbidities and participant's personal goals.       Expected Outcomes  Short Term: Increase workloads from initial exercise prescription for resistance, speed, and METs.       Able to understand and use rate of perceived exertion (RPE) scale  Yes       Intervention  Provide education and explanation on how to use RPE scale       Expected Outcomes  Short Term: Able to use RPE daily in rehab to express subjective intensity level;Long Term:  Able to use RPE  to guide intensity level when exercising independently       Able to understand and use Dyspnea scale  Yes       Intervention  Provide education and explanation on how to use Dyspnea scale       Expected Outcomes  Long Term: Able to use Dyspnea scale to guide intensity level when exercising independently;Short Term: Able to use Dyspnea scale daily in rehab to express subjective sense of shortness of breath during exertion       Knowledge and understanding of Target Heart Rate Range (THRR)  Yes       Intervention  Provide education and explanation of THRR including how the numbers were predicted and where they are located for reference       Expected Outcomes  Short Term: Able to state/look up THRR;Short Term: Able to use daily as guideline for intensity in rehab;Long Term: Able to use THRR to govern intensity when exercising independently       Understanding of Exercise Prescription  Yes       Intervention  Provide education, explanation, and written materials on patient's individual exercise prescription       Expected Outcomes  Short Term: Able to explain program exercise prescription;Long Term: Able to explain home exercise prescription to exercise independently          Exercise Goals Re-Evaluation :   Discharge Exercise Prescription (Final Exercise Prescription Changes):   Nutrition:  Target Goals: Understanding of nutrition guidelines, daily intake of sodium '1500mg'$ , cholesterol '200mg'$ , calories 30% from fat and 7% or less from saturated fats, daily to have 5 or more servings of fruits and vegetables.  Biometrics: Pre Biometrics - 05/17/17 0708      Pre Biometrics   Grip Strength  18 kg  Nutrition Therapy Plan and Nutrition Goals:   Nutrition Assessments:   Nutrition Goals Re-Evaluation:   Nutrition Goals Discharge (Final Nutrition Goals Re-Evaluation):   Psychosocial: Target Goals: Acknowledge presence or absence of significant depression and/or stress,  maximize coping skills, provide positive support system. Participant is able to verbalize types and ability to use techniques and skills needed for reducing stress and depression.  Initial Review & Psychosocial Screening: Initial Psych Review & Screening - 05/10/17 1039      Initial Review   Current issues with  Current Depression;Current Stress Concerns    Source of Stress Concerns  Chronic Illness      Family Dynamics   Good Support System?  Yes    Comments  patient feels her depression is under controll and it will not be a barrier to participation in pulmonary rehab      Barriers   Psychosocial barriers to participate in program  The patient should benefit from training in stress management and relaxation.      Screening Interventions   Interventions  Encouraged to exercise       Quality of Life Scores:  Scores of 19 and below usually indicate a poorer quality of life in these areas.  A difference of  2-3 points is a clinically meaningful difference.  A difference of 2-3 points in the total score of the Quality of Life Index has been associated with significant improvement in overall quality of life, self-image, physical symptoms, and general health in studies assessing change in quality of life.   PHQ-9: Recent Review Flowsheet Data    Depression screen [facility] 2/9 05/10/2017   Decreased Interest 1   Down, Depressed, Hopeless 1   PHQ - 2 Score 2   Altered sleeping 1   Tired, decreased energy 1   Change in appetite 1   Feeling bad or failure about yourself  1   Trouble concentrating 0   Moving slowly or fidgety/restless 0   Suicidal thoughts 0   PHQ-9 Score 6   Difficult doing work/chores Not difficult at all     Interpretation of Total Score  Total Score Depression Severity:  1-4 = Minimal depression, 5-9 = Mild depression, 10-14 = Moderate depression, 15-19 = Moderately severe depression, 20-27 = Severe depression   Psychosocial Evaluation and  Intervention:   Psychosocial Re-Evaluation:   Psychosocial Discharge (Final Psychosocial Re-Evaluation):   Education: Education Goals: Education classes will be provided on a weekly basis, covering required topics. Participant will state understanding/return demonstration of topics presented.  Learning Barriers/Preferences: Learning Barriers/Preferences - 05/10/17 1035      Learning Barriers/Preferences   Learning Barriers  None    Learning Preferences  Written Material;Audio       Education Topics: Risk Factor Reduction:  -Group instruction that is supported by a PowerPoint presentation. Instructor discusses the definition of a risk factor, different risk factors for pulmonary disease, and how the heart and lungs work together.     Nutrition for Pulmonary Patient:  -Group instruction provided by PowerPoint slides, verbal discussion, and written materials to support subject matter. The instructor gives an explanation and review of healthy diet recommendations, which includes a discussion on weight management, recommendations for fruit and vegetable consumption, as well as protein, fluid, caffeine, fiber, sodium, sugar, and alcohol. Tips for eating when patients are short of breath are discussed.   Pursed Lip Breathing:  -Group instruction that is supported by demonstration and informational handouts. Instructor discusses the benefits of pursed lip and diaphragmatic breathing  and detailed demonstration on how to preform both.     Oxygen Safety:  -Group instruction provided by PowerPoint, verbal discussion, and written material to support subject matter. There is an overview of "What is Oxygen" and "Why do we need it".  Instructor also reviews how to create a safe environment for oxygen use, the importance of using oxygen as prescribed, and the risks of noncompliance. There is a brief discussion on traveling with oxygen and resources the patient may utilize.   Oxygen Equipment:   -Group instruction provided by [facility] Staff utilizing handouts, written materials, and equipment demonstrations.   Signs and Symptoms:  -Group instruction provided by written material and verbal discussion to support subject matter. Warning signs and symptoms of infection, stroke, and heart attack are reviewed and when to call the physician/911 reinforced. Tips for preventing the spread of infection discussed.   Advanced Directives:  -Group instruction provided by verbal instruction and written material to support subject matter. Instructor reviews Advanced Directive laws and proper instruction for filling out document.   Pulmonary Video:  -Group video education that reviews the importance of medication and oxygen compliance, exercise, good nutrition, pulmonary hygiene, and pursed lip and diaphragmatic breathing for the pulmonary patient.   Exercise for the Pulmonary Patient:  -Group instruction that is supported by a PowerPoint presentation. Instructor discusses benefits of exercise, core components of exercise, frequency, duration, and intensity of an exercise routine, importance of utilizing pulse oximetry during exercise, safety while exercising, and options of places to exercise outside of rehab.     Pulmonary Medications:  -Verbally interactive group education provided by instructor with focus on inhaled medications and proper administration.   Anatomy and Physiology of the Respiratory System and Intimacy:  -Group instruction provided by PowerPoint, verbal discussion, and written material to support subject matter. Instructor reviews respiratory cycle and anatomical components of the respiratory system and their functions. Instructor also reviews differences in obstructive and restrictive respiratory diseases with examples of each. Intimacy, Sex, and Sexuality differences are reviewed with a discussion on how relationships can change when diagnosed with pulmonary disease. Common  sexual concerns are reviewed.   MD DAY -A group question and answer session with a medical doctor that allows participants to ask questions that relate to their pulmonary disease state.   OTHER EDUCATION -Group or individual verbal, written, or video instructions that support the educational goals of the pulmonary rehab program.   Holiday Eating Survival Tips:  -Group instruction provided by PowerPoint slides, verbal discussion, and written materials to support subject matter. The instructor gives patients tips, tricks, and techniques to help them not only survive but enjoy the holidays despite the onslaught of food that accompanies the holidays.   Knowledge Questionnaire Score:   Core Components/Risk Factors/Patient Goals at Admission: Personal Goals and Risk Factors at Admission - 05/10/17 1037      Core Components/Risk Factors/Patient Goals on Admission    Weight Management  Yes;Obesity    Intervention  Obesity: Provide education and appropriate resources to help participant work on and attain dietary goals.;Weight Management/Obesity: Establish reasonable short term and long term weight goals.    Expected Outcomes  Short Term: Continue to assess and modify interventions until short term weight is achieved;Long Term: Adherence to nutrition and physical activity/exercise program aimed toward attainment of established weight goal;Weight Loss: Understanding of general recommendations for a balanced deficit meal plan, which promotes 1-2 lb weight loss per week and includes a negative energy balance of [phone] kcal/d;Understanding recommendations for  meals to include 15-35% energy as protein, 25-35% energy from fat, 35-60% energy from carbohydrates, less than '200mg'$  of dietary cholesterol, 20-35 gm of total fiber daily;Understanding of distribution of calorie intake throughout the day with the consumption of 4-5 meals/snacks    Tobacco Cessation  Yes    Intervention  Assist the participant in  steps to quit. Provide individualized education and counseling about committing to Tobacco Cessation, relapse prevention, and pharmacological support that can be provided by physician.;Advice worker, assist with locating and accessing local/national Quit Smoking programs, and support quit date choice.    Expected Outcomes  Short Term: Will demonstrate readiness to quit, by selecting a quit date.;Short Term: Will quit all tobacco product use, adhering to prevention of relapse plan.;Long Term: Complete abstinence from all tobacco products for at least 12 months from quit date.    Heart Failure  Yes    Intervention  Provide a combined exercise and nutrition program that is supplemented with education, support and counseling about heart failure. Directed toward relieving symptoms such as shortness of breath, decreased exercise tolerance, and extremity edema.    Expected Outcomes  Improve functional capacity of life;Short term: Attendance in program 2-3 days a week with increased exercise capacity. Reported lower sodium intake. Reported increased fruit and vegetable intake. Reports medication compliance.;Short term: Daily weights obtained and reported for increase. Utilizing diuretic protocols set by physician.;Long term: Adoption of self-care skills and reduction of barriers for early signs and symptoms recognition and intervention leading to self-care maintenance.       Core Components/Risk Factors/Patient Goals Review:    Core Components/Risk Factors/Patient Goals at Discharge (Final Review):    ITP Comments:   Comments:

## 2017-05-19 ENCOUNTER — Other Ambulatory Visit: Payer: BC Managed Care – PPO

## 2017-05-20 ENCOUNTER — Ambulatory Visit
Admission: RE | Admit: 2017-05-20 | Discharge: 2017-05-20 | Disposition: A | Discharge status: Home / Self Care | Payer: BC Managed Care – PPO | Source: Ambulatory Visit | Attending: Neurosurgery | Admitting: Neurosurgery

## 2017-05-20 ENCOUNTER — Encounter (HOSPITAL_COMMUNITY)
Admission: RE | Admit: 2017-05-20 | Discharge: 2017-05-20 | Disposition: A | Discharge status: Home / Self Care | Payer: BC Managed Care – PPO | Source: Ambulatory Visit | Attending: Internal Medicine | Admitting: Internal Medicine

## 2017-05-20 DIAGNOSIS — I5022 Chronic systolic (congestive) heart failure: Secondary | ICD-10-CM | POA: Insufficient documentation

## 2017-05-20 DIAGNOSIS — M4712 Other spondylosis with myelopathy, cervical region: Secondary | ICD-10-CM

## 2017-05-20 LAB — GLUCOSE, CAPILLARY
Glucose-Capillary: 79 mg/dL (ref 65–99)
Glucose-Capillary: 91 mg/dL (ref 65–99)

## 2017-05-20 NOTE — Progress Notes (Signed)
Incomplete Session Note  Patient Details  Name: [name] Bean MRN: [id number] Date of Birth: 04-06-1951 Referring Provider:     Pulmonary Rehab Walk Test from 05/13/2017 in [place]  Referring Provider  Dr. Ilean China Dwana Curd did not complete her rehab session.  Her pre-exercise CBG was 79, she was given an 8 ounce cup of ginger ale, rechecked her CBG 15 minutes later and it was un to 91.  She had no complaints and was going straight home to eat a high protein snack with a charbohydrate as well.  Discussed what to eat before coming to exercise next week.

## 2017-05-25 ENCOUNTER — Encounter (HOSPITAL_COMMUNITY)
Admission: RE | Admit: 2017-05-25 | Discharge: 2017-05-25 | Disposition: A | Discharge status: Home / Self Care | Payer: BC Managed Care – PPO | Source: Ambulatory Visit | Attending: Internal Medicine | Admitting: Internal Medicine

## 2017-05-25 DIAGNOSIS — I5022 Chronic systolic (congestive) heart failure: Secondary | ICD-10-CM | POA: Diagnosis not present

## 2017-05-25 NOTE — Progress Notes (Signed)
Daily Session Note  Patient Details  Name: [name] Bean MRN: [id number] Date of Birth: 06-Jul-1950 Referring Provider:     Pulmonary Rehab Walk Test from 05/13/2017 in [place]  Referring Provider  Dr. Haroldine Laws      Encounter Date: 05/25/2017  Check In: Session Check In - 05/25/17 1330      Check-In   Location  [facility]    Staff Present  Rosebud Poles, RN, BSN;Molly diVincenzo, MS, ACSM RCEP, Exercise Physiologist;Portia Rollene Rotunda, RN, BSN    Supervising physician immediately available to respond to emergencies  [facility] immediately available    Physician(s)  Dr. Lonny Prude    Medication changes reported      No    Fall or balance concerns reported     No    Tobacco Cessation  No Change    Warm-up and Cool-down  Performed as group-led instruction    Resistance Training Performed  Yes    VAD Patient?  No      Pain Assessment   Currently in Pain?  No/denies    Multiple Pain Sites  No       Capillary Blood Glucose: No results found for this or any previous visit (from the past 24 hour(s)).    Social History   Tobacco Use  Smoking Status Current Every Day Smoker  . Packs/day: 0.25  . Years: 4.00  . Pack years: 1.00  Smokeless Tobacco Never Used  Tobacco Comment   smokes two a day    Goals Met:  Exercise tolerated well Strength training completed today  Goals Unmet:  Not Applicable  Comments: Service time is from 1330 to 1510.    Dr. Rush Farmer is Medical Director for Pulmonary Rehab at [facility].

## 2017-05-25 NOTE — Progress Notes (Signed)
Pulmonary Individual Treatment Plan  Patient Details  Name: [name] Bean MRN: [id number] Date of Birth: 05-16-1950 Referring Provider:     Pulmonary Rehab Walk Test from 05/13/2017 in [place]  Referring Provider  Dr. Haroldine Laws      Initial Encounter Date:    Pulmonary Rehab Walk Test from 05/13/2017 in [place]  Date  05/17/17  Referring Provider  Dr. Haroldine Laws      Visit Diagnosis: Chronic systolic HF (heart failure) ([place])  Patient's Home Medications on Admission:   Current Outpatient Medications:  .  aspirin EC 81 MG tablet, Take 81 mg by mouth daily. , Disp: , Rfl:  .  BD PEN NEEDLE NANO U/F 32G X 4 MM MISC, USE TO INJECT INSULIN 5 TIMES A DAY, Disp: 100 each, Rfl: 5 .  bimatoprost (LUMIGAN) 0.01 % SOLN, Lumigan 0.01 % eye drops  PUT 1 DROP INTO BOTH EYES AT BEDTIME, Disp: , Rfl:  .  Blood Glucose Monitoring Suppl (ACCU-CHEK COMPACT CARE KIT) KIT, Accu-Chek Compact Plus Care kit  USE AS INSTRUCTED., Disp: , Rfl:  .  carvedilol (COREG) 25 MG tablet, Take 1 tablet (25 mg total) by mouth 2 (two) times daily with a meal., Disp: 60 tablet, Rfl: 6 .  cyclobenzaprine (FLEXERIL) 5 MG tablet, cyclobenzaprine 5 mg tablet  TAKE 1 TABLET (5 MG TOTAL) BY MOUTH 3 (THREE) TIMES DAILY AS NEEDED FOR MUSCLE SPASMS., Disp: , Rfl:  .  dexamethasone (DECADRON) 4 MG tablet, dexamethasone 4 mg tablet, Disp: , Rfl:  .  escitalopram (LEXAPRO) 20 MG tablet, Take 1 tablet (20 mg total) by mouth daily., Disp: 90 tablet, Rfl: 3 .  glucose blood (FREESTYLE TEST STRIPS) test strip, 1 each by Other route daily. DX E11.9, Disp: 100 each, Rfl: 12 .  hydrALAZINE (APRESOLINE) 50 MG tablet, Take 50-100 mg by mouth See admin instructions. Take 100 mg by mouth in the morning and take 50 mg by mouth in the evening, Disp: , Rfl:  .  insulin aspart (NOVOLOG) 100 UNIT/ML injection, Inject 12 Units into the skin 3 (three) times daily before meals.,  Disp: 10 mL, Rfl: 11 .  Insulin Glargine (BASAGLAR KWIKPEN) 100 UNIT/ML SOPN, INJECT 26 UNITS SUBCUTANEOUSLY NIGHTLY, Disp: 15 mL, Rfl: 11 .  latanoprost (XALATAN) 0.005 % ophthalmic solution, Place 1 drop into both eyes at bedtime., Disp: , Rfl:  .  losartan (COZAAR) 100 MG tablet, Take 100 mg by mouth daily., Disp: , Rfl:  .  Melatonin 3 MG TABS, Take 3 mg by mouth at bedtime as needed (sleep)., Disp: , Rfl:  .  rosuvastatin (CRESTOR) 20 MG tablet, Take 20 mg by mouth daily., Disp: , Rfl:  .  spironolactone (ALDACTONE) 25 MG tablet, Take 25 mg by mouth daily., Disp: , Rfl:  .  torsemide (DEMADEX) 20 MG tablet, Take 20-40 mg by mouth See admin instructions. Take 20 mg by mouth daily and may take 20-40 mg by mouth as needed for swelling, Disp: , Rfl:  .  varenicline (CHANTIX CONTINUING MONTH PAK) 1 MG tablet, Take 1 tablet (1 mg total) by mouth 2 (two) times daily., Disp: 60 tablet, Rfl: 6  Past Medical History: Past Medical History:  Diagnosis Date  . Anemia   . Anxiety   . Arthritis   . Asthma   . Broken arm    left arm  . Bronchitis   . Bursitis of left hip   . CAD (coronary artery disease)   .  Candidiasis, vagina   . Cardiac arrest ([place]) 06/20/2016   at [facility]; ? due to flash pulm edema vs undertreated chronic CHF  . Cerumen impaction   . CHF (congestive heart failure) ([place])    RECENT ADMIT TO [facility]   . Colon, diverticulosis   . Depression   . Diabetes mellitus    15 YRS AGO  . Fatigue   . Gastroenteritis   . Hot flashes, menopausal   . Hyperlipidemia   . Knee pain, left   . Lumbar back pain   . Maxillary sinusitis    history  . Muscle tear    right gluteus  . Nausea   . Other B-complex deficiencies   . Otitis media, acute    left  . Peripheral neuropathy   . Spinal stenosis of lumbar region   . Tubulovillous adenoma of colon   . Unspecified essential hypertension     Tobacco Use: Social History   Tobacco Use  Smoking Status Current Every Day Smoker  .  Packs/day: 0.25  . Years: 4.00  . Pack years: 1.00  Smokeless Tobacco Never Used  Tobacco Comment   smokes two a day    Labs: Recent Review Flowsheet Data    Labs for ITP Cardiac and Pulmonary Rehab Latest Ref Rng & Units 10/28/2010 01/02/2011 09/09/2016 10/19/2016 04/15/2017   Cholestrol 0 - 200 mg/dL - 190 - - -   LDLCALC 0 - 99 mg/dL - 104(H) - - -   HDL >39.00 mg/dL - 52.30 - - -   Trlycerides 0.0 - 149.0 mg/dL - 167.0(H) - - -   Hemoglobin A1c - 8.4(H) 8.1(H) 8.1(H) 7.6(H) 9.1      Capillary Blood Glucose: Lab Results  Component Value Date   GLUCAP 91 05/20/2017   GLUCAP 79 05/20/2017   GLUCAP 222 (H) 10/24/2016   GLUCAP 269 (H) 10/23/2016   GLUCAP 289 (H) 10/23/2016     Pulmonary Assessment Scores: Pulmonary Assessment Scores    Row Name 05/17/17 0715         ADL UCSD   ADL Phase  Entry       mMRC Score   mMRC Score  0        Pulmonary Function Assessment: Pulmonary Function Assessment - 05/10/17 1036      Breath   Bilateral Breath Sounds  Clear    Shortness of Breath  Yes on occasion       Exercise Target Goals:    Exercise Program Goal: Individual exercise prescription set using results from initial 6 min walk test and THRR while considering  patient's activity barriers and safety.    Exercise Prescription Goal: Initial exercise prescription builds to 30-45 minutes a day of aerobic activity, 2-3 days per week.  Home exercise guidelines will be given to patient during program as part of exercise prescription that the participant will acknowledge.  Activity Barriers & Risk Stratification: Activity Barriers & Cardiac Risk Stratification - 05/10/17 1021      Activity Barriers & Cardiac Risk Stratification   Activity Barriers  Neck/Spine Problems;Back Problems;Deconditioning;Assistive Device;History of Falls;Balance Concerns       6 Minute Walk: 6 Minute Walk    Row Name 05/17/17 0708         6 Minute Walk   Phase  Initial     Distance  710  feet     Walk Time  6 minutes     # of Rest Breaks  0     MPH  1.34  METS  2     RPE  13     Perceived Dyspnea   0     Symptoms  Yes (comment)     Comments  used wheelchair, calf and hip pain-patient stated from stiffness     Resting HR  84 bpm     Resting BP  140/77     Resting Oxygen Saturation   97 %     Exercise Oxygen Saturation  during 6 min walk  97 %     Max Ex. HR  118 bpm     Max Ex. BP  143/76       Interval HR   1 Minute HR  94     2 Minute HR  86     3 Minute HR  104     4 Minute HR  110     5 Minute HR  118     6 Minute HR  101     2 Minute Post HR  95     Interval Heart Rate?  Yes       Interval Oxygen   Interval Oxygen?  Yes     Baseline Oxygen Saturation %  97 %     1 Minute Oxygen Saturation %  98 %     1 Minute Liters of Oxygen  0 L     2 Minute Oxygen Saturation %  98 %     2 Minute Liters of Oxygen  0 L     3 Minute Oxygen Saturation %  97 %     3 Minute Liters of Oxygen  0 L     4 Minute Oxygen Saturation %  98 %     4 Minute Liters of Oxygen  0 L     5 Minute Oxygen Saturation %  98 %     5 Minute Liters of Oxygen  0 L     6 Minute Oxygen Saturation %  99 %     6 Minute Liters of Oxygen  0 L     2 Minute Post Oxygen Saturation %  97 %     2 Minute Post Liters of Oxygen  0 L        Oxygen Initial Assessment: Oxygen Initial Assessment - 05/17/17 0708      Initial 6 min Walk   Oxygen Used  None      Program Oxygen Prescription   Program Oxygen Prescription  None       Oxygen Re-Evaluation: Oxygen Re-Evaluation    Row Name 05/21/17 1013             Program Oxygen Prescription   Program Oxygen Prescription  None         Home Oxygen   Home Oxygen Device  None       Sleep Oxygen Prescription  None       Home Exercise Oxygen Prescription  None       Home at Rest Exercise Oxygen Prescription  None          Oxygen Discharge (Final Oxygen Re-Evaluation): Oxygen Re-Evaluation - 05/21/17 1013      Program Oxygen Prescription    Program Oxygen Prescription  None      Home Oxygen   Home Oxygen Device  None    Sleep Oxygen Prescription  None    Home Exercise Oxygen Prescription  None    Home at Rest Exercise Oxygen Prescription  None  Initial Exercise Prescription: Initial Exercise Prescription - 05/17/17 0700      Date of Initial Exercise RX and Referring Provider   Date  05/17/17    Referring Provider  Dr. Haroldine Laws      Recumbant Bike   Level  2    Watts  20    Minutes  17      NuStep   Level  2    SPM  80    Minutes  17      Track   Laps  5    Minutes  17      Prescription Details   Frequency (times per week)  2    Duration  Progress to 45 minutes of aerobic exercise without signs/symptoms of physical distress      Intensity   THRR 40-80% of Max Heartrate  62-125    Ratings of Perceived Exertion  11-13    Perceived Dyspnea  0-4      Progression   Progression  Continue progressive overload as per policy without signs/symptoms or physical distress.      Resistance Training   Training Prescription  Yes    Weight  orange bands    Reps  10-15       Perform Capillary Blood Glucose checks as needed.  Exercise Prescription Changes:   Exercise Comments:   Exercise Goals and Review: Exercise Goals    Row Name 05/10/17 1022             Exercise Goals   Increase Physical Activity  Yes       Intervention  Provide advice, education, support and counseling about physical activity/exercise needs.;Develop an individualized exercise prescription for aerobic and resistive training based on initial evaluation findings, risk stratification, comorbidities and participant's personal goals.       Expected Outcomes  Short Term: Attend rehab on a regular basis to increase amount of physical activity.;Long Term: Add in home exercise to make exercise part of routine and to increase amount of physical activity.;Long Term: Exercising regularly at least 3-5 days a week.       Increase  Strength and Stamina  Yes       Intervention  Provide advice, education, support and counseling about physical activity/exercise needs.;Develop an individualized exercise prescription for aerobic and resistive training based on initial evaluation findings, risk stratification, comorbidities and participant's personal goals.       Expected Outcomes  Short Term: Increase workloads from initial exercise prescription for resistance, speed, and METs.       Able to understand and use rate of perceived exertion (RPE) scale  Yes       Intervention  Provide education and explanation on how to use RPE scale       Expected Outcomes  Short Term: Able to use RPE daily in rehab to express subjective intensity level;Long Term:  Able to use RPE to guide intensity level when exercising independently       Able to understand and use Dyspnea scale  Yes       Intervention  Provide education and explanation on how to use Dyspnea scale       Expected Outcomes  Long Term: Able to use Dyspnea scale to guide intensity level when exercising independently;Short Term: Able to use Dyspnea scale daily in rehab to express subjective sense of shortness of breath during exertion       Knowledge and understanding of Target Heart Rate Range (THRR)  Yes  Intervention  Provide education and explanation of THRR including how the numbers were predicted and where they are located for reference       Expected Outcomes  Short Term: Able to state/look up THRR;Short Term: Able to use daily as guideline for intensity in rehab;Long Term: Able to use THRR to govern intensity when exercising independently       Understanding of Exercise Prescription  Yes       Intervention  Provide education, explanation, and written materials on patient's individual exercise prescription       Expected Outcomes  Short Term: Able to explain program exercise prescription;Long Term: Able to explain home exercise prescription to exercise independently           Exercise Goals Re-Evaluation : Exercise Goals Re-Evaluation    [place] Name 05/21/17 1013             Exercise Goal Re-Evaluation   Exercise Goals Review  Increase Strength and Stamina;Able to understand and use Dyspnea scale;Increase Physical Activity;Able to understand and use rate of perceived exertion (RPE) scale;Knowledge and understanding of Target Heart Rate Range (THRR);Understanding of Exercise Prescription       Comments  Patient arrived for her first day of exercise but her blood sugar was too low. Will cont. to educate and motivate.       Expected Outcomes  Through exercise at rehab and at home, patient will increase strength and stamina and ADL's will be easier to perform.           Discharge Exercise Prescription (Final Exercise Prescription Changes):   Nutrition:  Target Goals: Understanding of nutrition guidelines, daily intake of sodium '1500mg'$ , cholesterol '200mg'$ , calories 30% from fat and 7% or less from saturated fats, daily to have 5 or more servings of fruits and vegetables.  Biometrics: Pre Biometrics - 05/17/17 0708      Pre Biometrics   Grip Strength  18 kg        Nutrition Therapy Plan and Nutrition Goals:   Nutrition Assessments:   Nutrition Goals Re-Evaluation:   Nutrition Goals Discharge (Final Nutrition Goals Re-Evaluation):   Psychosocial: Target Goals: Acknowledge presence or absence of significant depression and/or stress, maximize coping skills, provide positive support system. Participant is able to verbalize types and ability to use techniques and skills needed for reducing stress and depression.  Initial Review & Psychosocial Screening: Initial Psych Review & Screening - 05/10/17 1039      Initial Review   Current issues with  Current Depression;Current Stress Concerns    Source of Stress Concerns  Chronic Illness      Family Dynamics   Good Support System?  Yes    Comments  patient feels her depression is under controll and  it will not be a barrier to participation in pulmonary rehab      Barriers   Psychosocial barriers to participate in program  The patient should benefit from training in stress management and relaxation.      Screening Interventions   Interventions  Encouraged to exercise       Quality of Life Scores:  Scores of 19 and below usually indicate a poorer quality of life in these areas.  A difference of  2-3 points is a clinically meaningful difference.  A difference of 2-3 points in the total score of the Quality of Life Index has been associated with significant improvement in overall quality of life, self-image, physical symptoms, and general health in studies assessing change in quality of life.  PHQ-9: Recent Review Flowsheet Data    Depression screen [facility] 2/9 05/10/2017   Decreased Interest 1   Down, Depressed, Hopeless 1   PHQ - 2 Score 2   Altered sleeping 1   Tired, decreased energy 1   Change in appetite 1   Feeling bad or failure about yourself  1   Trouble concentrating 0   Moving slowly or fidgety/restless 0   Suicidal thoughts 0   PHQ-9 Score 6   Difficult doing work/chores Not difficult at all     Interpretation of Total Score  Total Score Depression Severity:  1-4 = Minimal depression, 5-9 = Mild depression, 10-14 = Moderate depression, 15-19 = Moderately severe depression, 20-27 = Severe depression   Psychosocial Evaluation and Intervention:   Psychosocial Re-Evaluation: Psychosocial Re-Evaluation    [place] Name 05/25/17 [phone]             Psychosocial Re-Evaluation   Current issues with  Current Depression;History of Depression;Current Stress Concerns       Comments  just started program, no change in psychosocial issues       Expected Outcomes  no barriers to participation in pulmonary rehab       Interventions  Encouraged to attend Pulmonary Rehabilitation for the exercise       Continue Psychosocial Services   Follow up required by staff       Comments   no change just began program         Initial Review   Source of Stress Concerns  Chronic Illness          Psychosocial Discharge (Final Psychosocial Re-Evaluation): Psychosocial Re-Evaluation - 05/25/17 0949      Psychosocial Re-Evaluation   Current issues with  Current Depression;History of Depression;Current Stress Concerns    Comments  just started program, no change in psychosocial issues    Expected Outcomes  no barriers to participation in pulmonary rehab    Interventions  Encouraged to attend Pulmonary Rehabilitation for the exercise    Continue Psychosocial Services   Follow up required by staff    Comments  no change just began program      Initial Review   Source of Stress Concerns  Chronic Illness       Education: Education Goals: Education classes will be provided on a weekly basis, covering required topics. Participant will state understanding/return demonstration of topics presented.  Learning Barriers/Preferences: Learning Barriers/Preferences - 05/10/17 1035      Learning Barriers/Preferences   Learning Barriers  None    Learning Preferences  Written Material;Audio       Education Topics: Risk Factor Reduction:  -Group instruction that is supported by a PowerPoint presentation. Instructor discusses the definition of a risk factor, different risk factors for pulmonary disease, and how the heart and lungs work together.     Nutrition for Pulmonary Patient:  -Group instruction provided by PowerPoint slides, verbal discussion, and written materials to support subject matter. The instructor gives an explanation and review of healthy diet recommendations, which includes a discussion on weight management, recommendations for fruit and vegetable consumption, as well as protein, fluid, caffeine, fiber, sodium, sugar, and alcohol. Tips for eating when patients are short of breath are discussed.   Pursed Lip Breathing:  -Group instruction that is supported by  demonstration and informational handouts. Instructor discusses the benefits of pursed lip and diaphragmatic breathing and detailed demonstration on how to preform both.     Oxygen Safety:  -Group instruction provided by PowerPoint,  verbal discussion, and written material to support subject matter. There is an overview of "What is Oxygen" and "Why do we need it".  Instructor also reviews how to create a safe environment for oxygen use, the importance of using oxygen as prescribed, and the risks of noncompliance. There is a brief discussion on traveling with oxygen and resources the patient may utilize.   PULMONARY REHAB OTHER RESPIRATORY from 05/20/2017 in [place]  Date  05/20/17  Educator  RN  Instruction Review Code  2- Demonstrated Understanding      Oxygen Equipment:  -Group instruction provided by [facility] Staff utilizing handouts, written materials, and equipment demonstrations.   Signs and Symptoms:  -Group instruction provided by written material and verbal discussion to support subject matter. Warning signs and symptoms of infection, stroke, and heart attack are reviewed and when to call the physician/911 reinforced. Tips for preventing the spread of infection discussed.   Advanced Directives:  -Group instruction provided by verbal instruction and written material to support subject matter. Instructor reviews Advanced Directive laws and proper instruction for filling out document.   Pulmonary Video:  -Group video education that reviews the importance of medication and oxygen compliance, exercise, good nutrition, pulmonary hygiene, and pursed lip and diaphragmatic breathing for the pulmonary patient.   Exercise for the Pulmonary Patient:  -Group instruction that is supported by a PowerPoint presentation. Instructor discusses benefits of exercise, core components of exercise, frequency, duration, and intensity of an exercise routine, importance  of utilizing pulse oximetry during exercise, safety while exercising, and options of places to exercise outside of rehab.     Pulmonary Medications:  -Verbally interactive group education provided by instructor with focus on inhaled medications and proper administration.   Anatomy and Physiology of the Respiratory System and Intimacy:  -Group instruction provided by PowerPoint, verbal discussion, and written material to support subject matter. Instructor reviews respiratory cycle and anatomical components of the respiratory system and their functions. Instructor also reviews differences in obstructive and restrictive respiratory diseases with examples of each. Intimacy, Sex, and Sexuality differences are reviewed with a discussion on how relationships can change when diagnosed with pulmonary disease. Common sexual concerns are reviewed.   MD DAY -A group question and answer session with a medical doctor that allows participants to ask questions that relate to their pulmonary disease state.   OTHER EDUCATION -Group or individual verbal, written, or video instructions that support the educational goals of the pulmonary rehab program.   Holiday Eating Survival Tips:  -Group instruction provided by PowerPoint slides, verbal discussion, and written materials to support subject matter. The instructor gives patients tips, tricks, and techniques to help them not only survive but enjoy the holidays despite the onslaught of food that accompanies the holidays.   Knowledge Questionnaire Score:   Core Components/Risk Factors/Patient Goals at Admission: Personal Goals and Risk Factors at Admission - 05/10/17 1037      Core Components/Risk Factors/Patient Goals on Admission    Weight Management  Yes;Obesity    Intervention  Obesity: Provide education and appropriate resources to help participant work on and attain dietary goals.;Weight Management/Obesity: Establish reasonable short term and long term  weight goals.    Expected Outcomes  Short Term: Continue to assess and modify interventions until short term weight is achieved;Long Term: Adherence to nutrition and physical activity/exercise program aimed toward attainment of established weight goal;Weight Loss: Understanding of general recommendations for a balanced deficit meal plan, which promotes 1-2 lb weight  loss per week and includes a negative energy balance of [phone] kcal/d;Understanding recommendations for meals to include 15-35% energy as protein, 25-35% energy from fat, 35-60% energy from carbohydrates, less than '200mg'$  of dietary cholesterol, 20-35 gm of total fiber daily;Understanding of distribution of calorie intake throughout the day with the consumption of 4-5 meals/snacks    Tobacco Cessation  Yes    Intervention  Assist the participant in steps to quit. Provide individualized education and counseling about committing to Tobacco Cessation, relapse prevention, and pharmacological support that can be provided by physician.;Advice worker, assist with locating and accessing local/national Quit Smoking programs, and support quit date choice.    Expected Outcomes  Short Term: Will demonstrate readiness to quit, by selecting a quit date.;Short Term: Will quit all tobacco product use, adhering to prevention of relapse plan.;Long Term: Complete abstinence from all tobacco products for at least 12 months from quit date.    Heart Failure  Yes    Intervention  Provide a combined exercise and nutrition program that is supplemented with education, support and counseling about heart failure. Directed toward relieving symptoms such as shortness of breath, decreased exercise tolerance, and extremity edema.    Expected Outcomes  Improve functional capacity of life;Short term: Attendance in program 2-3 days a week with increased exercise capacity. Reported lower sodium intake. Reported increased fruit and vegetable intake. Reports  medication compliance.;Short term: Daily weights obtained and reported for increase. Utilizing diuretic protocols set by physician.;Long term: Adoption of self-care skills and reduction of barriers for early signs and symptoms recognition and intervention leading to self-care maintenance.       Core Components/Risk Factors/Patient Goals Review:  Goals and Risk Factor Review    Row Name 05/25/17 0947             Core Components/Risk Factors/Patient Goals Review   Personal Goals Review  Weight Management/Obesity;Heart Failure;Tobacco Cessation       Review  Just started program, too early to have met any goals       Expected Outcomes  see admission goals          Core Components/Risk Factors/Patient Goals at Discharge (Final Review):  Goals and Risk Factor Review - 05/25/17 0947      Core Components/Risk Factors/Patient Goals Review   Personal Goals Review  Weight Management/Obesity;Heart Failure;Tobacco Cessation    Review  Just started program, too early to have met any goals    Expected Outcomes  see admission goals       ITP Comments:   Comments:ITP REVIEW Pt is making expected progress toward pulmonary rehab goals after completing 0 sessions. Recommend continued exercise, life style modification, education, and utilization of breathing techniques to increase stamina and strength and decrease shortness of breath with exertion.  She did not exercise on her first appointment due to low CBG, was given a snack and sent home.

## 2017-05-26 ENCOUNTER — Encounter (HOSPITAL_COMMUNITY): Payer: Self-pay | Admitting: *Deleted

## 2017-05-27 ENCOUNTER — Encounter (HOSPITAL_COMMUNITY)
Admission: RE | Admit: 2017-05-27 | Discharge: 2017-05-27 | Disposition: A | Discharge status: Home / Self Care | Payer: BC Managed Care – PPO | Source: Ambulatory Visit | Attending: Internal Medicine | Admitting: Internal Medicine

## 2017-05-27 DIAGNOSIS — I5022 Chronic systolic (congestive) heart failure: Secondary | ICD-10-CM | POA: Diagnosis not present

## 2017-05-27 NOTE — Progress Notes (Signed)
Daily Session Note  Patient Details  Name: [name] Bean MRN: [id number] Date of Birth: [birth year] Referring Provider:     Pulmonary Rehab Walk Test from 05/13/2017 in [place]  Referring Provider  Dr. Haroldine Laws      Encounter Date: 05/27/2017  Check In: Session Check In - 05/27/17 1330      Check-In   Location  [facility]    Staff Present  Su Hilt, MS, ACSM RCEP, Exercise Physiologist;Artemis Loyal Rollene Rotunda, RN, Roque Cash, RN    Supervising physician immediately available to respond to emergencies  [facility] immediately available    Physician(s)  Dr. Tyrell Antonio    Medication changes reported      No    Fall or balance concerns reported     No    Tobacco Cessation  No Change    Warm-up and Cool-down  Performed as group-led instruction    Resistance Training Performed  Yes    VAD Patient?  No      Pain Assessment   Currently in Pain?  No/denies    Multiple Pain Sites  No       Capillary Blood Glucose: No results found for this or any previous visit (from the past 24 hour(s)).    Social History   Tobacco Use  Smoking Status Current Every Day Smoker  . Packs/day: 0.25  . Years: 4.00  . Pack years: 1.00  Smokeless Tobacco Never Used  Tobacco Comment   smokes two a day    Goals Met:  Exercise tolerated well No report of cardiac concerns or symptoms Strength training completed today  Goals Unmet:  Not Applicable  Comments: Service time is from 1330 to 1535   Dr. Rush Farmer is Medical Director for Pulmonary Rehab at [facility].

## 2017-06-01 ENCOUNTER — Encounter (HOSPITAL_COMMUNITY)
Admission: RE | Admit: 2017-06-01 | Discharge: 2017-06-01 | Disposition: A | Discharge status: Home / Self Care | Payer: BC Managed Care – PPO | Source: Ambulatory Visit | Attending: Internal Medicine | Admitting: Internal Medicine

## 2017-06-01 VITALS — Wt 197.1 lb

## 2017-06-01 DIAGNOSIS — I5022 Chronic systolic (congestive) heart failure: Secondary | ICD-10-CM

## 2017-06-01 NOTE — Progress Notes (Signed)
Daily Session Note  Patient Details  Name: [name] Bean MRN: [id number] Date of Birth: 10-14-1950 Referring Provider:     Pulmonary Rehab Walk Test from 05/13/2017 in [place]  Referring Provider  Dr. Haroldine Laws      Encounter Date: 06/01/2017  Check In: Session Check In - 06/01/17 1330      Check-In   Location  [facility]    Staff Present  Rosebud Poles, RN, Luisa Hart, RN, BSN;Molly diVincenzo, MS, ACSM RCEP, Exercise Physiologist    Supervising physician immediately available to respond to emergencies  [facility] immediately available    Physician(s)  Dr. Tyrell Antonio    Medication changes reported      No    Fall or balance concerns reported     No    Tobacco Cessation  No Change    Warm-up and Cool-down  Performed as group-led instruction    Resistance Training Performed  Yes    VAD Patient?  No      Pain Assessment   Currently in Pain?  No/denies    Multiple Pain Sites  No       Capillary Blood Glucose: No results found for this or any previous visit (from the past 24 hour(s)).  Exercise Prescription Changes - 06/01/17 1500      Response to Exercise   Blood Pressure (Admit)  130/50    Blood Pressure (Exercise)  150/70    Blood Pressure (Exit)  102/62    Heart Rate (Admit)  93 bpm    Heart Rate (Exercise)  111 bpm    Heart Rate (Exit)  92 bpm    Oxygen Saturation (Admit)  99 %    Oxygen Saturation (Exercise)  96 %    Oxygen Saturation (Exit)  97 %    Rating of Perceived Exertion (Exercise)  11    Perceived Dyspnea (Exercise)  1    Duration  Progress to 45 minutes of aerobic exercise without signs/symptoms of physical distress    Intensity  Other (comment) 40-80% HRR      Progression   Progression  Continue to progress workloads to maintain intensity without signs/symptoms of physical distress.      Resistance Training   Training Prescription  Yes    Weight  orange bands    Reps  10-15       NuStep   Level  4    SPM  80    Minutes  17      Arm Ergometer   Level  1    Minutes  17      Track   Laps  8       Social History   Tobacco Use  Smoking Status Current Every Day Smoker  . Packs/day: 0.25  . Years: 4.00  . Pack years: 1.00  Smokeless Tobacco Never Used  Tobacco Comment   smokes two a day    Goals Met:  Exercise tolerated well Strength training completed today  Goals Unmet:  Not Applicable  Comments: Service time is from 1330 to 1500    Dr. Rush Farmer is Medical Director for Pulmonary Rehab at [facility].

## 2017-06-03 ENCOUNTER — Encounter (HOSPITAL_COMMUNITY)
Admission: RE | Admit: 2017-06-03 | Discharge: 2017-06-03 | Disposition: A | Discharge status: Home / Self Care | Payer: BC Managed Care – PPO | Source: Ambulatory Visit | Attending: Internal Medicine | Admitting: Internal Medicine

## 2017-06-03 DIAGNOSIS — I5022 Chronic systolic (congestive) heart failure: Secondary | ICD-10-CM | POA: Diagnosis not present

## 2017-06-03 LAB — GLUCOSE, CAPILLARY: Glucose-Capillary: 149 mg/dL — ABNORMAL HIGH (ref 65–99)

## 2017-06-03 NOTE — Progress Notes (Signed)
Daily Session Note  Patient Details  Name: [name] Bean MRN: [id number] Date of Birth: 1950/08/06 Referring Provider:     Pulmonary Rehab Walk Test from 05/13/2017 in [place]  Referring Provider  Dr. Haroldine Laws      Encounter Date: 06/03/2017  Check In: Session Check In - 06/03/17 1330      Check-In   Location  [facility]    Staff Present  Rosebud Poles, RN, Luisa Hart, RN, BSN;Molly diVincenzo, MS, ACSM RCEP, Exercise Physiologist;Lisa Ysidro Evert, RN    Supervising physician immediately available to respond to emergencies  [facility] immediately available    Physician(s)  Dr. Horris Latino    Medication changes reported      No    Fall or balance concerns reported     No    Comments  pt walks with a cane for balance    Tobacco Cessation  No Change    Warm-up and Cool-down  Performed as group-led instruction    Resistance Training Performed  Yes    VAD Patient?  No      Pain Assessment   Currently in Pain?  No/denies    Multiple Pain Sites  No       Capillary Blood Glucose: Results for orders placed or performed during the hospital encounter of 06/03/17 (from the past 24 hour(s))  Glucose, capillary     Status: Abnormal   Collection Time: 06/03/17  3:17 PM  Result Value Ref Range   Glucose-Capillary 149 (H) 65 - 99 mg/dL      Social History   Tobacco Use  Smoking Status Current Every Day Smoker  . Packs/day: 0.25  . Years: 4.00  . Pack years: 1.00  Smokeless Tobacco Never Used  Tobacco Comment   smokes two a day    Goals Met:  Exercise tolerated well Strength training completed today  Goals Unmet:  Not Applicable  Comments: Service time is from 1330 to 1515.    Dr. Rush Farmer is Medical Director for Pulmonary Rehab at [facility].

## 2017-06-04 NOTE — Progress Notes (Signed)
[name] Bean 67 y.o. female  DOB: 1951/02/27 MRN: [id number]           Nutrition Brief Note 1. Chronic systolic HF (heart failure) (HCC)    Past Medical History:  Diagnosis Date  . Anemia   . Anxiety   . Arthritis   . Asthma   . Broken arm    left arm  . Bronchitis   . Bursitis of left hip   . CAD (coronary artery disease)   . Candidiasis, vagina   . Cardiac arrest ([place]) 06/20/2016   at [facility]; ? due to flash pulm edema vs undertreated chronic CHF  . Cerumen impaction   . CHF (congestive heart failure) ([place])    RECENT ADMIT TO [facility]   . Colon, diverticulosis   . Depression   . Diabetes mellitus    15 YRS AGO  . Fatigue   . Gastroenteritis   . Hot flashes, menopausal   . Hyperlipidemia   . Knee pain, left   . Lumbar back pain   . Maxillary sinusitis    history  . Muscle tear    right gluteus  . Nausea   . Other B-complex deficiencies   . Otitis media, acute    left  . Peripheral neuropathy   . Spinal stenosis of lumbar region   . Tubulovillous adenoma of colon   . Unspecified essential hypertension    Meds reviewed. Decadron, Novolog, Basaglar  Ht: Ht Readings from Last 1 Encounters:  05/10/17 5\' 2"  (1.575 m)    Wt:  Wt Readings from Last 3 Encounters:  06/01/17 197 lb 1.5 oz (89.4 kg)  05/10/17 193 lb 5.5 oz (87.7 kg)  04/15/17 191 lb (86.6 kg)    BMI: 36.04    Current tobacco use? Yes     Labs:  Lab Results  Component Value Date   HGBA1C 9.1 04/15/2017   Nutrition Diagnosis ? Food-and nutrition-related knowledge deficit related to lack of exposure to information as related to diagnosis of pulmonary disease ? Obesity related to excessive energy intake as evidenced by a BMI of 36.0  Goal(s) 1. Identify food quantities necessary to achieve wt loss of  -2# per week to a goal wt loss of 2.7-10.9 kg (6-24 lb) at graduation from pulmonary rehab. 2. Describe the benefit of including fruits, vegetables, whole grains, and low-fat dairy products  in a healthy meal plan. 3. Improved glycemic control as evidenced by pt's A1c trending from 9.1 to less than 7  Plan:  Pt to attend Pulmonary Nutrition class Will provide client-centered nutrition education as part of interdisciplinary care.   Monitor and evaluate progress toward nutrition goal with team.  Monitor and Evaluate progress toward nutrition goal with team.   Derek Mound, M.Ed, RD, LDN, CDE 06/04/2017 12:16 PM

## 2017-06-08 ENCOUNTER — Telehealth (HOSPITAL_COMMUNITY): Payer: Self-pay | Admitting: Family Medicine

## 2017-06-08 ENCOUNTER — Encounter (HOSPITAL_COMMUNITY): Payer: BC Managed Care – PPO

## 2017-06-10 ENCOUNTER — Telehealth (HOSPITAL_COMMUNITY): Payer: Self-pay | Admitting: Family Medicine

## 2017-06-10 ENCOUNTER — Encounter (HOSPITAL_COMMUNITY): Payer: BC Managed Care – PPO

## 2017-06-15 ENCOUNTER — Encounter (HOSPITAL_COMMUNITY)
Admission: RE | Admit: 2017-06-15 | Discharge: 2017-06-15 | Disposition: A | Discharge status: Home / Self Care | Payer: BC Managed Care – PPO | Source: Ambulatory Visit | Attending: Internal Medicine | Admitting: Internal Medicine

## 2017-06-15 VITALS — Wt 194.4 lb

## 2017-06-15 DIAGNOSIS — I5022 Chronic systolic (congestive) heart failure: Secondary | ICD-10-CM | POA: Insufficient documentation

## 2017-06-15 NOTE — Progress Notes (Signed)
Daily Session Note  Patient Details  Name: Leeah Politano MRN: [id number] Date of Birth: 1950-10-16 Referring Provider:     Pulmonary Rehab Walk Test from 05/13/2017 in [place]  Referring Provider  Dr. Haroldine Laws      Encounter Date: 06/15/2017  Check In: Session Check In - 06/15/17 1511      Check-In   Location  [facility]       Capillary Blood Glucose: No results found for this or any previous visit (from the past 24 hour(s)).  Exercise Prescription Changes - 06/15/17 1500      Response to Exercise   Blood Pressure (Admit)  116/50    Blood Pressure (Exercise)  138/58    Blood Pressure (Exit)  114/64    Heart Rate (Admit)  89 bpm    Heart Rate (Exercise)  100 bpm    Heart Rate (Exit)  80 bpm    Oxygen Saturation (Admit)  96 %    Oxygen Saturation (Exercise)  96 %    Oxygen Saturation (Exit)  97 %    Rating of Perceived Exertion (Exercise)  13    Perceived Dyspnea (Exercise)  0    Duration  Progress to 45 minutes of aerobic exercise without signs/symptoms of physical distress    Intensity  Other (comment) 40-80% HRR      Progression   Progression  Continue to progress workloads to maintain intensity without signs/symptoms of physical distress.      Resistance Training   Training Prescription  Yes    Weight  orange bands    Reps  10-15      NuStep   Level  4    SPM  80    Minutes  17      Arm Ergometer   Level  1    Minutes  17      Track   Laps  6    Minutes  17       Social History   Tobacco Use  Smoking Status Current Every Day Smoker  . Packs/day: 0.25  . Years: 4.00  . Pack years: 1.00  Smokeless Tobacco Never Used  Tobacco Comment   smokes two a day    Goals Met:  Exercise tolerated well No report of cardiac concerns or symptoms Strength training completed today  Goals Unmet:  Not Applicable  Comments: Service time is from 10:30a to 12:05p    Dr. Rush Farmer is  Medical Director for Pulmonary Rehab at [facility].

## 2017-06-17 ENCOUNTER — Encounter (HOSPITAL_COMMUNITY)
Admission: RE | Admit: 2017-06-17 | Discharge: 2017-06-17 | Disposition: A | Discharge status: Home / Self Care | Payer: BC Managed Care – PPO | Source: Ambulatory Visit | Attending: Internal Medicine | Admitting: Internal Medicine

## 2017-06-17 DIAGNOSIS — I5022 Chronic systolic (congestive) heart failure: Secondary | ICD-10-CM | POA: Diagnosis not present

## 2017-06-17 NOTE — Progress Notes (Signed)
Daily Session Note  Patient Details  Name: [name] Bean MRN: [id number] Date of Birth: [birth year] Referring Provider:     Pulmonary Rehab Walk Test from 05/13/2017 in [place]  Referring Provider  Dr. Haroldine Laws      Encounter Date: 06/17/2017  Check In: Session Check In - 06/17/17 1348      Check-In   Location  [facility]    Staff Present  Rosebud Poles, RN, BSN;Molly diVincenzo, MS, ACSM RCEP, Exercise Physiologist;Lisa Ysidro Evert, RN;Portia Rollene Rotunda, RN, BSN    Supervising physician immediately available to respond to emergencies  [facility] immediately available    Physician(s)  Dr. Horris Latino    Medication changes reported      No    Fall or balance concerns reported     No    Tobacco Cessation  No Change    Warm-up and Cool-down  Performed as group-led instruction    Resistance Training Performed  Yes    VAD Patient?  No      Pain Assessment   Currently in Pain?  No/denies    Multiple Pain Sites  No       Capillary Blood Glucose: No results found for this or any previous visit (from the past 24 hour(s)).    Social History   Tobacco Use  Smoking Status Current Every Day Smoker  . Packs/day: 0.25  . Years: 4.00  . Pack years: 1.00  Smokeless Tobacco Never Used  Tobacco Comment   smokes two a day    Goals Met:  Exercise tolerated well Strength training completed today  Goals Unmet:  Not Applicable  Comments: Service time is from 1330 to 1510.    Dr. Rush Farmer is Medical Director for Pulmonary Rehab at [facility].

## 2017-06-21 ENCOUNTER — Telehealth (HOSPITAL_COMMUNITY): Payer: Self-pay

## 2017-06-21 NOTE — Progress Notes (Signed)
Pulmonary Individual Treatment Plan  Patient Details  Name: [name] Bean MRN: [id number] Date of Birth: 1950-11-07 Referring Provider:     Pulmonary Rehab Walk Test from 05/13/2017 in [facility]  Referring Provider  Dr. Haroldine Laws      Initial Encounter Date:    Pulmonary Rehab Walk Test from 05/13/2017 in [facility]  Date  05/17/17  Referring Provider  Dr. Haroldine Laws      Visit Diagnosis: Chronic systolic HF (heart failure) ([place])  Patient's Home Medications on Admission:   Current Outpatient Medications:  .  aspirin EC 81 MG tablet, Take 81 mg by mouth daily. , Disp: , Rfl:  .  BD PEN NEEDLE NANO U/F 32G X 4 MM MISC, USE TO INJECT INSULIN 5 TIMES A DAY, Disp: 100 each, Rfl: 5 .  bimatoprost (LUMIGAN) 0.01 % SOLN, Lumigan 0.01 % eye drops  PUT 1 DROP INTO BOTH EYES AT BEDTIME, Disp: , Rfl:  .  Blood Glucose Monitoring Suppl (ACCU-CHEK COMPACT CARE KIT) KIT, Accu-Chek Compact Plus Care kit  USE AS INSTRUCTED., Disp: , Rfl:  .  carvedilol (COREG) 25 MG tablet, Take 1 tablet (25 mg total) by mouth 2 (two) times daily with a meal., Disp: 60 tablet, Rfl: 6 .  cyclobenzaprine (FLEXERIL) 5 MG tablet, cyclobenzaprine 5 mg tablet  TAKE 1 TABLET (5 MG TOTAL) BY MOUTH 3 (THREE) TIMES DAILY AS NEEDED FOR MUSCLE SPASMS., Disp: , Rfl:  .  dexamethasone (DECADRON) 4 MG tablet, dexamethasone 4 mg tablet, Disp: , Rfl:  .  escitalopram (LEXAPRO) 20 MG tablet, Take 1 tablet (20 mg total) by mouth daily., Disp: 90 tablet, Rfl: 3 .  glucose blood (FREESTYLE TEST STRIPS) test strip, 1 each by Other route daily. DX E11.9, Disp: 100 each, Rfl: 12 .  hydrALAZINE (APRESOLINE) 50 MG tablet, Take 50-100 mg by mouth See admin instructions. Take 100 mg by mouth in the morning and take 50 mg by mouth in the evening, Disp: , Rfl:  .  insulin aspart (NOVOLOG) 100 UNIT/ML injection, Inject 12 Units into the skin 3 (three) times daily before meals.,  Disp: 10 mL, Rfl: 11 .  Insulin Glargine (BASAGLAR KWIKPEN) 100 UNIT/ML SOPN, INJECT 26 UNITS SUBCUTANEOUSLY NIGHTLY, Disp: 15 mL, Rfl: 11 .  latanoprost (XALATAN) 0.005 % ophthalmic solution, Place 1 drop into both eyes at bedtime., Disp: , Rfl:  .  losartan (COZAAR) 100 MG tablet, Take 100 mg by mouth daily., Disp: , Rfl:  .  Melatonin 3 MG TABS, Take 3 mg by mouth at bedtime as needed (sleep)., Disp: , Rfl:  .  rosuvastatin (CRESTOR) 20 MG tablet, Take 20 mg by mouth daily., Disp: , Rfl:  .  spironolactone (ALDACTONE) 25 MG tablet, Take 25 mg by mouth daily., Disp: , Rfl:  .  torsemide (DEMADEX) 20 MG tablet, Take 20-40 mg by mouth See admin instructions. Take 20 mg by mouth daily and may take 20-40 mg by mouth as needed for swelling, Disp: , Rfl:  .  varenicline (CHANTIX CONTINUING MONTH PAK) 1 MG tablet, Take 1 tablet (1 mg total) by mouth 2 (two) times daily., Disp: 60 tablet, Rfl: 6  Past Medical History: Past Medical History:  Diagnosis Date  . Anemia   . Anxiety   . Arthritis   . Asthma   . Broken arm    left arm  . Bronchitis   . Bursitis of left hip   . CAD (coronary artery disease)   .  Candidiasis, vagina   . Cardiac arrest ([place]) 06/20/2016   at [facility]; ? due to flash pulm edema vs undertreated chronic CHF  . Cerumen impaction   . CHF (congestive heart failure) ([place])    RECENT ADMIT TO [facility]   . Colon, diverticulosis   . Depression   . Diabetes mellitus    15 YRS AGO  . Fatigue   . Gastroenteritis   . Hot flashes, menopausal   . Hyperlipidemia   . Knee pain, left   . Lumbar back pain   . Maxillary sinusitis    history  . Muscle tear    right gluteus  . Nausea   . Other B-complex deficiencies   . Otitis media, acute    left  . Peripheral neuropathy   . Spinal stenosis of lumbar region   . Tubulovillous adenoma of colon   . Unspecified essential hypertension     Tobacco Use: Social History   Tobacco Use  Smoking Status Current Every Day Smoker  .  Packs/day: 0.25  . Years: 4.00  . Pack years: 1.00  Smokeless Tobacco Never Used  Tobacco Comment   smokes two a day    Labs: Recent Review Flowsheet Data    Labs for ITP Cardiac and Pulmonary Rehab Latest Ref Rng & Units 10/28/2010 01/02/2011 09/09/2016 10/19/2016 04/15/2017   Cholestrol 0 - 200 mg/dL - 190 - - -   LDLCALC 0 - 99 mg/dL - 104(H) - - -   HDL >39.00 mg/dL - 52.30 - - -   Trlycerides 0.0 - 149.0 mg/dL - 167.0(H) - - -   Hemoglobin A1c - 8.4(H) 8.1(H) 8.1(H) 7.6(H) 9.1      Capillary Blood Glucose: Lab Results  Component Value Date   GLUCAP 149 (H) 06/03/2017   GLUCAP 91 05/20/2017   GLUCAP 79 05/20/2017   GLUCAP 222 (H) 10/24/2016   GLUCAP 269 (H) 10/23/2016     Pulmonary Assessment Scores: Pulmonary Assessment Scores    Row Name 05/17/17 0715 05/26/17 1351       ADL UCSD   ADL Phase  Entry  Entry    SOB Score total  -  1      CAT Score   CAT Score  -  4 Entry      mMRC Score   mMRC Score  0  -       Pulmonary Function Assessment: Pulmonary Function Assessment - 05/10/17 1036      Breath   Bilateral Breath Sounds  Clear    Shortness of Breath  Yes on occasion       Exercise Target Goals:    Exercise Program Goal: Individual exercise prescription set using results from initial 6 min walk test and THRR while considering  patient's activity barriers and safety.    Exercise Prescription Goal: Initial exercise prescription builds to 30-45 minutes a day of aerobic activity, 2-3 days per week.  Home exercise guidelines will be given to patient during program as part of exercise prescription that the participant will acknowledge.  Activity Barriers & Risk Stratification: Activity Barriers & Cardiac Risk Stratification - 05/10/17 1021      Activity Barriers & Cardiac Risk Stratification   Activity Barriers  Neck/Spine Problems;Back Problems;Deconditioning;Assistive Device;History of Falls;Balance Concerns       6 Minute Walk: 6 Minute Walk     Row Name 05/17/17 0708         6 Minute Walk   Phase  Initial     Distance  710 feet     Walk Time  6 minutes     # of Rest Breaks  0     MPH  1.34     METS  2     RPE  13     Perceived Dyspnea   0     Symptoms  Yes (comment)     Comments  used wheelchair, calf and hip pain-patient stated from stiffness     Resting HR  84 bpm     Resting BP  140/77     Resting Oxygen Saturation   97 %     Exercise Oxygen Saturation  during 6 min walk  97 %     Max Ex. HR  118 bpm     Max Ex. BP  143/76       Interval HR   1 Minute HR  94     2 Minute HR  86     3 Minute HR  104     4 Minute HR  110     5 Minute HR  118     6 Minute HR  101     2 Minute Post HR  95     Interval Heart Rate?  Yes       Interval Oxygen   Interval Oxygen?  Yes     Baseline Oxygen Saturation %  97 %     1 Minute Oxygen Saturation %  98 %     1 Minute Liters of Oxygen  0 L     2 Minute Oxygen Saturation %  98 %     2 Minute Liters of Oxygen  0 L     3 Minute Oxygen Saturation %  97 %     3 Minute Liters of Oxygen  0 L     4 Minute Oxygen Saturation %  98 %     4 Minute Liters of Oxygen  0 L     5 Minute Oxygen Saturation %  98 %     5 Minute Liters of Oxygen  0 L     6 Minute Oxygen Saturation %  99 %     6 Minute Liters of Oxygen  0 L     2 Minute Post Oxygen Saturation %  97 %     2 Minute Post Liters of Oxygen  0 L        Oxygen Initial Assessment: Oxygen Initial Assessment - 05/17/17 0708      Initial 6 min Walk   Oxygen Used  None      Program Oxygen Prescription   Program Oxygen Prescription  None       Oxygen Re-Evaluation: Oxygen Re-Evaluation    Row Name 05/21/17 1013 06/21/17 0841           Program Oxygen Prescription   Program Oxygen Prescription  None  None        Home Oxygen   Home Oxygen Device  None  None      Sleep Oxygen Prescription  None  None      Home Exercise Oxygen Prescription  None  None      Home at Rest Exercise Oxygen Prescription  None  None          Oxygen Discharge (Final Oxygen Re-Evaluation): Oxygen Re-Evaluation - 06/21/17 0841      Program Oxygen Prescription   Program Oxygen Prescription  None      Home Oxygen   Home Oxygen  Device  None    Sleep Oxygen Prescription  None    Home Exercise Oxygen Prescription  None    Home at Rest Exercise Oxygen Prescription  None       Initial Exercise Prescription: Initial Exercise Prescription - 05/17/17 0700      Date of Initial Exercise RX and Referring Provider   Date  05/17/17    Referring Provider  Dr. Haroldine Laws      Recumbant Bike   Level  2    Watts  20    Minutes  17      NuStep   Level  2    SPM  80    Minutes  17      Track   Laps  5    Minutes  17      Prescription Details   Frequency (times per week)  2    Duration  Progress to 45 minutes of aerobic exercise without signs/symptoms of physical distress      Intensity   THRR 40-80% of Max Heartrate  62-125    Ratings of Perceived Exertion  11-13    Perceived Dyspnea  0-4      Progression   Progression  Continue progressive overload as per policy without signs/symptoms or physical distress.      Resistance Training   Training Prescription  Yes    Weight  orange bands    Reps  10-15       Perform Capillary Blood Glucose checks as needed.  Exercise Prescription Changes: Exercise Prescription Changes    Row Name 06/01/17 1500 06/15/17 1500           Response to Exercise   Blood Pressure (Admit)  130/50  116/50      Blood Pressure (Exercise)  150/70  138/58      Blood Pressure (Exit)  102/62  114/64      Heart Rate (Admit)  93 bpm  89 bpm      Heart Rate (Exercise)  111 bpm  100 bpm      Heart Rate (Exit)  92 bpm  80 bpm      Oxygen Saturation (Admit)  99 %  96 %      Oxygen Saturation (Exercise)  96 %  96 %      Oxygen Saturation (Exit)  97 %  97 %      Rating of Perceived Exertion (Exercise)  11  13      Perceived Dyspnea (Exercise)  1  0      Duration  Progress to 45 minutes of  aerobic exercise without signs/symptoms of physical distress  Progress to 45 minutes of aerobic exercise without signs/symptoms of physical distress      Intensity  Other (comment) 40-80% HRR  Other (comment) 40-80% HRR        Progression   Progression  Continue to progress workloads to maintain intensity without signs/symptoms of physical distress.  Continue to progress workloads to maintain intensity without signs/symptoms of physical distress.        Resistance Training   Training Prescription  Yes  Yes      Weight  orange bands  orange bands      Reps  10-15  10-15        NuStep   Level  4  4      SPM  80  80      Minutes  17  17        Arm Ergometer  Level  1  1      Minutes  17  17        Track   Laps  8  6      Minutes  -  17         Exercise Comments:   Exercise Goals and Review: Exercise Goals    Row Name 05/10/17 1022             Exercise Goals   Increase Physical Activity  Yes       Intervention  Provide advice, education, support and counseling about physical activity/exercise needs.;Develop an individualized exercise prescription for aerobic and resistive training based on initial evaluation findings, risk stratification, comorbidities and participant's personal goals.       Expected Outcomes  Short Term: Attend rehab on a regular basis to increase amount of physical activity.;Long Term: Add in home exercise to make exercise part of routine and to increase amount of physical activity.;Long Term: Exercising regularly at least 3-5 days a week.       Increase Strength and Stamina  Yes       Intervention  Provide advice, education, support and counseling about physical activity/exercise needs.;Develop an individualized exercise prescription for aerobic and resistive training based on initial evaluation findings, risk stratification, comorbidities and participant's personal goals.       Expected Outcomes  Short Term: Increase workloads from initial exercise  prescription for resistance, speed, and METs.       Able to understand and use rate of perceived exertion (RPE) scale  Yes       Intervention  Provide education and explanation on how to use RPE scale       Expected Outcomes  Short Term: Able to use RPE daily in rehab to express subjective intensity level;Long Term:  Able to use RPE to guide intensity level when exercising independently       Able to understand and use Dyspnea scale  Yes       Intervention  Provide education and explanation on how to use Dyspnea scale       Expected Outcomes  Long Term: Able to use Dyspnea scale to guide intensity level when exercising independently;Short Term: Able to use Dyspnea scale daily in rehab to express subjective sense of shortness of breath during exertion       Knowledge and understanding of Target Heart Rate Range (THRR)  Yes       Intervention  Provide education and explanation of THRR including how the numbers were predicted and where they are located for reference       Expected Outcomes  Short Term: Able to state/look up THRR;Short Term: Able to use daily as guideline for intensity in rehab;Long Term: Able to use THRR to govern intensity when exercising independently       Understanding of Exercise Prescription  Yes       Intervention  Provide education, explanation, and written materials on patient's individual exercise prescription       Expected Outcomes  Short Term: Able to explain program exercise prescription;Long Term: Able to explain home exercise prescription to exercise independently          Exercise Goals Re-Evaluation : Exercise Goals Re-Evaluation    Row Name 05/21/17 1013 06/21/17 0841           Exercise Goal Re-Evaluation   Exercise Goals Review  Increase Strength and Stamina;Able to understand and use Dyspnea scale;Increase Physical Activity;Able to understand and use rate of perceived  exertion (RPE) scale;Knowledge and understanding of Target Heart Rate Range  (THRR);Understanding of Exercise Prescription  Increase Strength and Stamina;Able to understand and use Dyspnea scale;Increase Physical Activity;Able to understand and use rate of perceived exertion (RPE) scale;Knowledge and understanding of Target Heart Rate Range (THRR);Understanding of Exercise Prescription      Comments  Patient arrived for her first day of exercise but her blood sugar was too low. Will cont. to educate and motivate.  Due to patient's deconditioning, her progress has been slow. She is limited by her chronic pain. Patient has only been able to completed 5-6 laps (200 ft) in 15 minutes. I will cont. to progress patient as tolerated. I will also go over home exercise and stress its importance.      Expected Outcomes  Through exercise at rehab and at home, patient will increase strength and stamina and ADL's will be easier to perform.   Through exercise at rehab, the patient will increase strength and stamina and gain the confidence to establish an exercise regime at home. Through exercise, patient will gain a greater quality of life with ADL's being easier to perform.          Discharge Exercise Prescription (Final Exercise Prescription Changes): Exercise Prescription Changes - 06/15/17 1500      Response to Exercise   Blood Pressure (Admit)  116/50    Blood Pressure (Exercise)  138/58    Blood Pressure (Exit)  114/64    Heart Rate (Admit)  89 bpm    Heart Rate (Exercise)  100 bpm    Heart Rate (Exit)  80 bpm    Oxygen Saturation (Admit)  96 %    Oxygen Saturation (Exercise)  96 %    Oxygen Saturation (Exit)  97 %    Rating of Perceived Exertion (Exercise)  13    Perceived Dyspnea (Exercise)  0    Duration  Progress to 45 minutes of aerobic exercise without signs/symptoms of physical distress    Intensity  Other (comment) 40-80% HRR      Progression   Progression  Continue to progress workloads to maintain intensity without signs/symptoms of physical distress.       Resistance Training   Training Prescription  Yes    Weight  orange bands    Reps  10-15      NuStep   Level  4    SPM  80    Minutes  17      Arm Ergometer   Level  1    Minutes  17      Track   Laps  6    Minutes  17       Nutrition:  Target Goals: Understanding of nutrition guidelines, daily intake of sodium '1500mg'$ , cholesterol '200mg'$ , calories 30% from fat and 7% or less from saturated fats, daily to have 5 or more servings of fruits and vegetables.  Biometrics: Pre Biometrics - 05/17/17 0708      Pre Biometrics   Grip Strength  18 kg        Nutrition Therapy Plan and Nutrition Goals: Nutrition Therapy & Goals - 06/04/17 1221      Nutrition Therapy   Diet  Carb Modified, Heart Healthy Low Sodium       Personal Nutrition Goals   Nutrition Goal  Identify food quantities necessary to achieve wt loss of  -2# per week to a goal wt loss of 2.7-10.9 kg (6-24 lb) at graduation from pulmonary rehab.    Personal  Goal #2  Describe the benefit of including fruits, vegetables, whole grains, and low-fat dairy products in a healthy meal plan.    Personal Goal #3  Improved glycemic control as evidenced by pt's A1c trending from 9.1 to less than 7      Intervention Plan   Intervention  Prescribe, educate and counsel regarding individualized specific dietary modifications aiming towards targeted core components such as weight, hypertension, lipid management, diabetes, heart failure and other comorbidities.    Expected Outcomes  Short Term Goal: Understand basic principles of dietary content, such as calories, fat, sodium, cholesterol and nutrients.;Long Term Goal: Adherence to prescribed nutrition plan.       Nutrition Assessments: Nutrition Assessments - 06/04/17 1219      Rate Your Plate Scores   Pre Score  47       Nutrition Goals Re-Evaluation:   Nutrition Goals Discharge (Final Nutrition Goals Re-Evaluation):   Psychosocial: Target Goals: Acknowledge presence  or absence of significant depression and/or stress, maximize coping skills, provide positive support system. Participant is able to verbalize types and ability to use techniques and skills needed for reducing stress and depression.  Initial Review & Psychosocial Screening: Initial Psych Review & Screening - 05/10/17 1039      Initial Review   Current issues with  Current Depression;Current Stress Concerns    Source of Stress Concerns  Chronic Illness      Family Dynamics   Good Support System?  Yes    Comments  patient feels her depression is under controll and it will not be a barrier to participation in pulmonary rehab      Barriers   Psychosocial barriers to participate in program  The patient should benefit from training in stress management and relaxation.      Screening Interventions   Interventions  Encouraged to exercise       Quality of Life Scores:  Scores of 19 and below usually indicate a poorer quality of life in these areas.  A difference of  2-3 points is a clinically meaningful difference.  A difference of 2-3 points in the total score of the Quality of Life Index has been associated with significant improvement in overall quality of life, self-image, physical symptoms, and general health in studies assessing change in quality of life.   PHQ-9: Recent Review Flowsheet Data    Depression screen [facility] 2/9 05/10/2017   Decreased Interest 1   Down, Depressed, Hopeless 1   PHQ - 2 Score 2   Altered sleeping 1   Tired, decreased energy 1   Change in appetite 1   Feeling bad or failure about yourself  1   Trouble concentrating 0   Moving slowly or fidgety/restless 0   Suicidal thoughts 0   PHQ-9 Score 6   Difficult doing work/chores Not difficult at all     Interpretation of Total Score  Total Score Depression Severity:  1-4 = Minimal depression, 5-9 = Mild depression, 10-14 = Moderate depression, 15-19 = Moderately severe depression, 20-27 = Severe depression    Psychosocial Evaluation and Intervention:   Psychosocial Re-Evaluation: Psychosocial Re-Evaluation    [place] Name 05/25/17 0949 06/14/17 1400           Psychosocial Re-Evaluation   Current issues with  Current Depression;History of Depression;Current Stress Concerns  Current Depression;History of Depression      Comments  just started program, no change in psychosocial issues  just started program, no change in psychosocial issues  Expected Outcomes  no barriers to participation in pulmonary rehab  no barriers to participation in pulmonary rehab      Interventions  Encouraged to attend Pulmonary Rehabilitation for the exercise  Encouraged to attend Pulmonary Rehabilitation for the exercise      Continue Psychosocial Services   Follow up required by staff  Follow up required by staff      Comments  no change just began program  no change just began program        Initial Review   Source of Stress Concerns  Chronic Illness  Chronic Illness         Psychosocial Discharge (Final Psychosocial Re-Evaluation): Psychosocial Re-Evaluation - 06/14/17 1400      Psychosocial Re-Evaluation   Current issues with  Current Depression;History of Depression    Comments  just started program, no change in psychosocial issues    Expected Outcomes  no barriers to participation in pulmonary rehab    Interventions  Encouraged to attend Pulmonary Rehabilitation for the exercise    Continue Psychosocial Services   Follow up required by staff    Comments  no change just began program      Initial Review   Source of Stress Concerns  Chronic Illness       Education: Education Goals: Education classes will be provided on a weekly basis, covering required topics. Participant will state understanding/return demonstration of topics presented.  Learning Barriers/Preferences: Learning Barriers/Preferences - 05/10/17 1035      Learning Barriers/Preferences   Learning Barriers  None    Learning  Preferences  Written Material;Audio       Education Topics: Risk Factor Reduction:  -Group instruction that is supported by a PowerPoint presentation. Instructor discusses the definition of a risk factor, different risk factors for pulmonary disease, and how the heart and lungs work together.     Nutrition for Pulmonary Patient:  -Group instruction provided by PowerPoint slides, verbal discussion, and written materials to support subject matter. The instructor gives an explanation and review of healthy diet recommendations, which includes a discussion on weight management, recommendations for fruit and vegetable consumption, as well as protein, fluid, caffeine, fiber, sodium, sugar, and alcohol. Tips for eating when patients are short of breath are discussed.   Pursed Lip Breathing:  -Group instruction that is supported by demonstration and informational handouts. Instructor discusses the benefits of pursed lip and diaphragmatic breathing and detailed demonstration on how to preform both.     Oxygen Safety:  -Group instruction provided by PowerPoint, verbal discussion, and written material to support subject matter. There is an overview of "What is Oxygen" and "Why do we need it".  Instructor also reviews how to create a safe environment for oxygen use, the importance of using oxygen as prescribed, and the risks of noncompliance. There is a brief discussion on traveling with oxygen and resources the patient may utilize.   PULMONARY REHAB OTHER RESPIRATORY from 06/17/2017 in [place]  Date  05/20/17  Educator  RN  Instruction Review Code  2- Demonstrated Understanding      Oxygen Equipment:  -Group instruction provided by [facility] Staff utilizing handouts, written materials, and equipment demonstrations.   PULMONARY REHAB OTHER RESPIRATORY from 06/17/2017 in [place]  Date  05/27/17  Educator  [facility]  Instruction  Review Code  1- Verbalizes Understanding      Signs and Symptoms:  -Group instruction provided by written material and verbal discussion to  support subject matter. Warning signs and symptoms of infection, stroke, and heart attack are reviewed and when to call the physician/911 reinforced. Tips for preventing the spread of infection discussed.   Advanced Directives:  -Group instruction provided by verbal instruction and written material to support subject matter. Instructor reviews Advanced Directive laws and proper instruction for filling out document.   Pulmonary Video:  -Group video education that reviews the importance of medication and oxygen compliance, exercise, good nutrition, pulmonary hygiene, and pursed lip and diaphragmatic breathing for the pulmonary patient.   Exercise for the Pulmonary Patient:  -Group instruction that is supported by a PowerPoint presentation. Instructor discusses benefits of exercise, core components of exercise, frequency, duration, and intensity of an exercise routine, importance of utilizing pulse oximetry during exercise, safety while exercising, and options of places to exercise outside of rehab.     PULMONARY REHAB OTHER RESPIRATORY from 06/17/2017 in [place]  Date  06/03/17  Educator  EP  Instruction Review Code  1- Verbalizes Understanding      Pulmonary Medications:  -Verbally interactive group education provided by instructor with focus on inhaled medications and proper administration.   Anatomy and Physiology of the Respiratory System and Intimacy:  -Group instruction provided by PowerPoint, verbal discussion, and written material to support subject matter. Instructor reviews respiratory cycle and anatomical components of the respiratory system and their functions. Instructor also reviews differences in obstructive and restrictive respiratory diseases with examples of each. Intimacy, Sex, and Sexuality  differences are reviewed with a discussion on how relationships can change when diagnosed with pulmonary disease. Common sexual concerns are reviewed.   MD DAY -A group question and answer session with a medical doctor that allows participants to ask questions that relate to their pulmonary disease state.   OTHER EDUCATION -Group or individual verbal, written, or video instructions that support the educational goals of the pulmonary rehab program.   Holiday Eating Survival Tips:  -Group instruction provided by PowerPoint slides, verbal discussion, and written materials to support subject matter. The instructor gives patients tips, tricks, and techniques to help them not only survive but enjoy the holidays despite the onslaught of food that accompanies the holidays.   Knowledge Questionnaire Score: Knowledge Questionnaire Score - 05/26/17 1350      Knowledge Questionnaire Score   Pre Score  13/18       Core Components/Risk Factors/Patient Goals at Admission: Personal Goals and Risk Factors at Admission - 05/10/17 1037      Core Components/Risk Factors/Patient Goals on Admission    Weight Management  Yes;Obesity    Intervention  Obesity: Provide education and appropriate resources to help participant work on and attain dietary goals.;Weight Management/Obesity: Establish reasonable short term and long term weight goals.    Expected Outcomes  Short Term: Continue to assess and modify interventions until short term weight is achieved;Long Term: Adherence to nutrition and physical activity/exercise program aimed toward attainment of established weight goal;Weight Loss: Understanding of general recommendations for a balanced deficit meal plan, which promotes 1-2 lb weight loss per week and includes a negative energy balance of [phone] kcal/d;Understanding recommendations for meals to include 15-35% energy as protein, 25-35% energy from fat, 35-60% energy from carbohydrates, less than '200mg'$  of  dietary cholesterol, 20-35 gm of total fiber daily;Understanding of distribution of calorie intake throughout the day with the consumption of 4-5 meals/snacks    Tobacco Cessation  Yes    Intervention  Assist the participant in steps to quit.  Provide individualized education and counseling about committing to Tobacco Cessation, relapse prevention, and pharmacological support that can be provided by physician.;Advice worker, assist with locating and accessing local/national Quit Smoking programs, and support quit date choice.    Expected Outcomes  Short Term: Will demonstrate readiness to quit, by selecting a quit date.;Short Term: Will quit all tobacco product use, adhering to prevention of relapse plan.;Long Term: Complete abstinence from all tobacco products for at least 12 months from quit date.    Heart Failure  Yes    Intervention  Provide a combined exercise and nutrition program that is supplemented with education, support and counseling about heart failure. Directed toward relieving symptoms such as shortness of breath, decreased exercise tolerance, and extremity edema.    Expected Outcomes  Improve functional capacity of life;Short term: Attendance in program 2-3 days a week with increased exercise capacity. Reported lower sodium intake. Reported increased fruit and vegetable intake. Reports medication compliance.;Short term: Daily weights obtained and reported for increase. Utilizing diuretic protocols set by physician.;Long term: Adoption of self-care skills and reduction of barriers for early signs and symptoms recognition and intervention leading to self-care maintenance.       Core Components/Risk Factors/Patient Goals Review:  Goals and Risk Factor Review    Row Name 05/25/17 0947 06/14/17 1359           Core Components/Risk Factors/Patient Goals Review   Personal Goals Review  Weight Management/Obesity;Heart Failure;Tobacco Cessation  Weight Management/Obesity;Heart  Failure;Tobacco Cessation      Review  Just started program, too early to have met any goals  Has attended 4 exercise sessions, was sick last week, too early to see progression towards goals, heart failure is stable at this point, no weight loss.      Expected Outcomes  see admission goals  see admission goals         Core Components/Risk Factors/Patient Goals at Discharge (Final Review):  Goals and Risk Factor Review - 06/14/17 1359      Core Components/Risk Factors/Patient Goals Review   Personal Goals Review  Weight Management/Obesity;Heart Failure;Tobacco Cessation    Review  Has attended 4 exercise sessions, was sick last week, too early to see progression towards goals, heart failure is stable at this point, no weight loss.    Expected Outcomes  see admission goals       ITP Comments:   Comments: patient has attended 7 sessions since admission however she was not allowed to exercise at her first session related to low CBG

## 2017-06-21 NOTE — Telephone Encounter (Signed)
CHF Clinic appointment reminder call placed to patient for upcoming post-hospital follow up.  [facility] to confirm appt  Patient also reminded to take all medications as prescribed on the day of his/her appointment and to bring all medications to this appointment.  Advised to call our office for tardiness or cancellations/rescheduling needs.  .Annise Boran Genevea  

## 2017-06-22 ENCOUNTER — Encounter (HOSPITAL_COMMUNITY): Payer: BC Managed Care – PPO

## 2017-06-22 ENCOUNTER — Encounter (HOSPITAL_COMMUNITY)
Admission: RE | Admit: 2017-06-22 | Discharge: 2017-06-22 | Disposition: A | Discharge status: Home / Self Care | Payer: BC Managed Care – PPO | Source: Ambulatory Visit | Attending: Internal Medicine | Admitting: Internal Medicine

## 2017-06-22 DIAGNOSIS — I5022 Chronic systolic (congestive) heart failure: Secondary | ICD-10-CM | POA: Diagnosis not present

## 2017-06-22 NOTE — Progress Notes (Signed)
Daily Session Note  Patient Details  Name: [name] Bean MRN: [id number] Date of Birth: 1950-11-16 Referring Provider:     Pulmonary Rehab Walk Test from 05/13/2017 in [place]  Referring Provider  Dr. Haroldine Laws      Encounter Date: 06/22/2017  Check In: Session Check In - 06/22/17 1556      Check-In   Location  [facility]    Staff Present  Rodney Langton, RN;Portia Rollene Rotunda, RN, BSN;Xavi Tomasik, MS, ACSM RCEP, Exercise Physiologist    Supervising physician immediately available to respond to emergencies  [facility] immediately available    Physician(s)  Dr. Horris Latino    Medication changes reported      No    Fall or balance concerns reported     No    Tobacco Cessation  No Change    Warm-up and Cool-down  Performed as group-led instruction    Resistance Training Performed  Yes    VAD Patient?  No      Pain Assessment   Currently in Pain?  No/denies    Multiple Pain Sites  No       Capillary Blood Glucose: No results found for this or any previous visit (from the past 24 hour(s)).    Social History   Tobacco Use  Smoking Status Current Every Day Smoker  . Packs/day: 0.25  . Years: 4.00  . Pack years: 1.00  Smokeless Tobacco Never Used  Tobacco Comment   smokes two a day    Goals Met:  Exercise tolerated well No report of cardiac concerns or symptoms Strength training completed today  Goals Unmet:  Not Applicable  Comments: Service time is from 1:30p to 3:30p    Dr. Rush Farmer is Medical Director for Pulmonary Rehab at [facility].

## 2017-06-24 ENCOUNTER — Encounter (HOSPITAL_COMMUNITY): Payer: BC Managed Care – PPO

## 2017-06-24 ENCOUNTER — Encounter (HOSPITAL_COMMUNITY)
Admission: RE | Admit: 2017-06-24 | Discharge: 2017-06-24 | Disposition: A | Discharge status: Home / Self Care | Payer: BC Managed Care – PPO | Source: Ambulatory Visit | Attending: Internal Medicine | Admitting: Internal Medicine

## 2017-06-24 ENCOUNTER — Encounter (HOSPITAL_COMMUNITY): Payer: Self-pay

## 2017-06-24 ENCOUNTER — Ambulatory Visit (HOSPITAL_COMMUNITY)
Admission: RE | Admit: 2017-06-24 | Discharge: 2017-06-24 | Disposition: A | Discharge status: Home / Self Care | Payer: BC Managed Care – PPO | Source: Ambulatory Visit | Attending: Internal Medicine | Admitting: Internal Medicine

## 2017-06-24 VITALS — BP 158/82 | HR 70 | Wt 193.4 lb

## 2017-06-24 DIAGNOSIS — Z833 Family history of diabetes mellitus: Secondary | ICD-10-CM | POA: Insufficient documentation

## 2017-06-24 DIAGNOSIS — Z9889 Other specified postprocedural states: Secondary | ICD-10-CM | POA: Diagnosis not present

## 2017-06-24 DIAGNOSIS — M48061 Spinal stenosis, lumbar region without neurogenic claudication: Secondary | ICD-10-CM | POA: Insufficient documentation

## 2017-06-24 DIAGNOSIS — Z79899 Other long term (current) drug therapy: Secondary | ICD-10-CM | POA: Insufficient documentation

## 2017-06-24 DIAGNOSIS — I5022 Chronic systolic (congestive) heart failure: Secondary | ICD-10-CM | POA: Diagnosis not present

## 2017-06-24 DIAGNOSIS — I2510 Atherosclerotic heart disease of native coronary artery without angina pectoris: Secondary | ICD-10-CM | POA: Diagnosis not present

## 2017-06-24 DIAGNOSIS — F17210 Nicotine dependence, cigarettes, uncomplicated: Secondary | ICD-10-CM | POA: Insufficient documentation

## 2017-06-24 DIAGNOSIS — I5032 Chronic diastolic (congestive) heart failure: Secondary | ICD-10-CM

## 2017-06-24 DIAGNOSIS — J45909 Unspecified asthma, uncomplicated: Secondary | ICD-10-CM | POA: Insufficient documentation

## 2017-06-24 DIAGNOSIS — Z7982 Long term (current) use of aspirin: Secondary | ICD-10-CM | POA: Diagnosis not present

## 2017-06-24 DIAGNOSIS — Z888 Allergy status to other drugs, medicaments and biological substances status: Secondary | ICD-10-CM | POA: Diagnosis not present

## 2017-06-24 DIAGNOSIS — M1990 Unspecified osteoarthritis, unspecified site: Secondary | ICD-10-CM | POA: Insufficient documentation

## 2017-06-24 DIAGNOSIS — F17200 Nicotine dependence, unspecified, uncomplicated: Secondary | ICD-10-CM

## 2017-06-24 DIAGNOSIS — I219 Acute myocardial infarction, unspecified: Secondary | ICD-10-CM | POA: Insufficient documentation

## 2017-06-24 DIAGNOSIS — Z8674 Personal history of sudden cardiac arrest: Secondary | ICD-10-CM | POA: Insufficient documentation

## 2017-06-24 DIAGNOSIS — E785 Hyperlipidemia, unspecified: Secondary | ICD-10-CM | POA: Insufficient documentation

## 2017-06-24 DIAGNOSIS — I110 Hypertensive heart disease with heart failure: Secondary | ICD-10-CM | POA: Insufficient documentation

## 2017-06-24 DIAGNOSIS — M4802 Spinal stenosis, cervical region: Secondary | ICD-10-CM | POA: Diagnosis not present

## 2017-06-24 DIAGNOSIS — I10 Essential (primary) hypertension: Secondary | ICD-10-CM | POA: Diagnosis not present

## 2017-06-24 DIAGNOSIS — Z8249 Family history of ischemic heart disease and other diseases of the circulatory system: Secondary | ICD-10-CM | POA: Insufficient documentation

## 2017-06-24 DIAGNOSIS — E1140 Type 2 diabetes mellitus with diabetic neuropathy, unspecified: Secondary | ICD-10-CM | POA: Diagnosis not present

## 2017-06-24 DIAGNOSIS — Z955 Presence of coronary angioplasty implant and graft: Secondary | ICD-10-CM | POA: Insufficient documentation

## 2017-06-24 DIAGNOSIS — Z794 Long term (current) use of insulin: Secondary | ICD-10-CM | POA: Insufficient documentation

## 2017-06-24 LAB — BASIC METABOLIC PANEL
ANION GAP: 12 (ref 5–15)
BUN: 7 mg/dL (ref 6–20)
CALCIUM: 8.8 mg/dL — AB (ref 8.9–10.3)
CO2: 24 mmol/L (ref 22–32)
Chloride: 101 mmol/L (ref 101–111)
Creatinine, Ser: 0.69 mg/dL (ref 0.44–1.00)
GFR calc non Af Amer: >60 mL/min (ref >60)
Glucose, Bld: 257 mg/dL — ABNORMAL HIGH (ref 65–99)
POTASSIUM: 3.6 mmol/L (ref 3.5–5.1)
Sodium: 137 mmol/L (ref 135–145)

## 2017-06-24 NOTE — Progress Notes (Signed)
ADVANCED HF CLINIC CONSULT NOTE  Referring Physician: Sharren Bridge NP-C  Primary Care: Primary Cardiologist: [name] Bean HF MD: Dr [name] Bean.    HPI: [name] is a 67 year old with a h/o cardiac arrest 2018, CAD S/P PCI Stent  to RCA 2002 and distal LAD 1443, chronic diastolic heart failure, HTN, Hyperlipidemia, spinal stenosis, tobacco use and DMII referred to HF clinic by [name] Bridge NP at [facility] for HF follow up.   Admitted March 2018 to [facility] after cardiac arrest (PEA) thought to be from pulmonary edema .Ran out of lasix 2 weeks prior to admit. Initially intubated. Had strep pneumo in sputum. Ruled out for PE. No evidenced of ischemia. Diuresed with IV lasix transition to 60 mg torsemide daily. Discharge weight 191 pounds. Torsemide was later cut back to 20 mg daily at the office visit 08/03/2016.   Today she returns for HF follow up. Overall feeling fine. Denies PND/Orthopnea. Mild dyspnea with exertion. Appetite ok. No fever or chills. Weight at home has been stable. Attending pulmonary rehab twice a week. Taking all medications but did not take them this morning.    CMRI 06/2016 1. The left ventricle is normal in cavity size and overall wall thickness. There is basal sigmoid septal hypertrophy. Global systolic function is normal. The LV ejection fraction is 65%. There is hypokinesia of the basal and mid inferior wall, and hypokinesia of the distal anterior wall. 2. The right ventricle is normal in cavity size, wall thickness, and systolic function. 3. The left atrium is mildly dilated, the right atrium is normal in size. 4. The aortic valve is trileaflet with no significant aortic valve stenosis or regurgitation. There is no significant valvular disease.  5. Delayed enhancement imaging demonstrates two areas with myocardial infarction: a. subendocardial (25% transmural extent) infarct in the mid and apical anterior wall (LAD territory). b. infarct in the basal to distal  inferior wall (RCA territory), which is 75% transmural at the base, and subendocardial (25% transmural extent) in the mid and distal segments. There is residual viability in all territories, except the basal inferior segment. 6. Regadenoson stress perfusion imaging demonstrates no evidence of inducible myocardial ischemia. 7. No LV thrombus seen.   Past Medical History:  Diagnosis Date  . Anemia   . Anxiety   . Arthritis   . Asthma   . Broken arm    left arm  . Bronchitis   . Bursitis of left hip   . CAD (coronary artery disease)   . Candidiasis, vagina   . Cardiac arrest ([place]) 06/20/2016   at [facility]; ? due to flash pulm edema vs undertreated chronic CHF  . Cerumen impaction   . CHF (congestive heart failure) ([place])    RECENT ADMIT TO [facility]   . Colon, diverticulosis   . Depression   . Diabetes mellitus    15 YRS AGO  . Fatigue   . Gastroenteritis   . Hot flashes, menopausal   . Hyperlipidemia   . Knee pain, left   . Lumbar back pain   . Maxillary sinusitis    history  . Muscle tear    right gluteus  . Nausea   . Other B-complex deficiencies   . Otitis media, acute    left  . Peripheral neuropathy   . Spinal stenosis of lumbar region   . Tubulovillous adenoma of colon   . Unspecified essential hypertension     Current Outpatient Medications  Medication Sig Dispense Refill  . aspirin EC  81 MG tablet Take 81 mg by mouth daily.     . BD PEN NEEDLE NANO U/F 32G X 4 MM MISC USE TO INJECT INSULIN 5 TIMES A DAY 100 each 5  . bimatoprost (LUMIGAN) 0.01 % SOLN Lumigan 0.01 % eye drops  PUT 1 DROP INTO BOTH EYES AT BEDTIME    . Blood Glucose Monitoring Suppl (ACCU-CHEK COMPACT CARE KIT) KIT Accu-Chek Compact Plus Care kit  USE AS INSTRUCTED.    [name] carvedilol (COREG) 25 MG tablet Take 1 tablet (25 mg total) by mouth 2 (two) times daily with a meal. 60 tablet 6  . cyclobenzaprine (FLEXERIL) 5 MG tablet cyclobenzaprine 5 mg tablet  TAKE 1 TABLET (5 MG TOTAL) BY MOUTH 3  (THREE) TIMES DAILY AS NEEDED FOR MUSCLE SPASMS.    [name] escitalopram (LEXAPRO) 20 MG tablet Take 1 tablet (20 mg total) by mouth daily. 90 tablet 3  . glucose blood (FREESTYLE TEST STRIPS) test strip 1 each by Other route daily. DX E11.9 100 each 12  . hydrALAZINE (APRESOLINE) 50 MG tablet Take 50-100 mg by mouth See admin instructions. Take 100 mg by mouth in the morning and take 50 mg by mouth in the evening    . insulin aspart (NOVOLOG) 100 UNIT/ML injection Inject 12 Units into the skin 3 (three) times daily before meals. 10 mL 11  . Insulin Glargine (BASAGLAR KWIKPEN) 100 UNIT/ML SOPN INJECT 26 UNITS SUBCUTANEOUSLY NIGHTLY 15 mL 11  . latanoprost (XALATAN) 0.005 % ophthalmic solution Place 1 drop into both eyes at bedtime.    [name] losartan (COZAAR) 100 MG tablet Take 100 mg by mouth daily.    . Melatonin 3 MG TABS Take 3 mg by mouth at bedtime as needed (sleep).    . rosuvastatin (CRESTOR) 20 MG tablet Take 20 mg by mouth daily.    [name] spironolactone (ALDACTONE) 25 MG tablet Take 25 mg by mouth daily.    [name] torsemide (DEMADEX) 20 MG tablet Take 20-40 mg by mouth See admin instructions. Take 20 mg by mouth daily and may take 20-40 mg by mouth as needed for swelling    . varenicline (CHANTIX CONTINUING MONTH PAK) 1 MG tablet Take 1 tablet (1 mg total) by mouth 2 (two) times daily. 60 tablet 6   No current facility-administered medications for this encounter.     Allergies  Allergen Reactions  . Sitagliptin Other (See Comments)    Had pancreatitis while on Januvia       Social History   Socioeconomic History  . Marital status: Single    Spouse name: Not on file  . Number of children: Not on file  . Years of education: Not on file  . Highest education level: Not on file  Social Needs  . Financial resource strain: Not on file  . Food insecurity - worry: Not on file  . Food insecurity - inability: Not on file  . Transportation needs - medical: Not on file  . Transportation needs -  non-medical: Not on file  Occupational History  . Occupation: SR DEV OFFICER    Employer: [employer]    Comment: [employer]  Tobacco Use  . Smoking status: Current Every Day Smoker    Packs/day: 0.25    Years: 4.00    Pack years: 1.00  . Smokeless tobacco: Never Used  . Tobacco comment: smokes two a day  Substance and Sexual Activity  . Alcohol use: Yes    Comment: rarely  . Drug use: Yes  Types: Marijuana    Comment: about two years ago  . Sexual activity: Not on file  Other Topics Concern  . Not on file  Social History Narrative  . Not on file      Family History  Problem Relation Age of Onset  . Pneumonia Father        deceased  . Alcohol abuse Father   . Hypertension Mother   . Diabetes Mother     Vitals:   06/24/17 1116  BP: (!) 158/82  Pulse: 70  SpO2: 98%  Weight: 193 lb 6.4 oz (87.7 kg)    PHYSICAL EXAM: General:  Well appearing. No resp difficulty. Walked in the clinic.  HEENT: normal Neck: supple. no JVD. Carotids 2+ bilat; no bruits. No lymphadenopathy or thryomegaly appreciated. Cor: PMI nondisplaced. Regular rate & rhythm. No rubs, gallops or murmurs. Lungs: clear Abdomen: soft, nontender, nondistended. No hepatosplenomegaly. No bruits or masses. Good bowel sounds. Extremities: no cyanosis, clubbing, rash, edema Neuro: alert & orientedx3, cranial nerves grossly intact. moves all 4 extremities w/o difficulty. Affect pleasant    ASSESSMENT & PLAN: 1. Chronic Diastolic Heart Failure: cMRI EF 60-65%.  - NYHA II. Volume status stable. Continue current dose torsemide and spiro.    2. CAD- S/P Stent RCA 2002 and LAD 2015 -stress cMRI at [facility] 3/18 with EF 60-65% no reversible ischemia -No s/s ischemia.  - Continue statin and ASA.   3. HTN Elevated but stable when she takes meds.   4. Tobacco abuse - Discussed smoking cessation.   5. Spinal Stenosis - s/p cervical laminectomy.   Check BMET.  Follow up in 3-4 months.   [name] Zender,  NP-C 11:21 AM

## 2017-06-24 NOTE — Progress Notes (Signed)
Daily Session Note  Patient Details  Name: [name] Bean MRN: [id number] Date of Birth: [birth year] Referring Provider:     Pulmonary Rehab Walk Test from 05/13/2017 in [place]  Referring Provider  Dr. Haroldine Laws      Encounter Date: 06/24/2017  Check In: Session Check In - 06/24/17 1406      Check-In   Location  [facility]    Staff Present  Su Hilt, MS, ACSM RCEP, Exercise Physiologist;Wenceslaus Gist Ysidro Evert, RN;Portia Rollene Rotunda, RN, BSN    Supervising physician immediately available to respond to emergencies  [facility] immediately available    Physician(s)  Dr. Wendee Beavers    Medication changes reported      No    Fall or balance concerns reported     No    Tobacco Cessation  No Change    Warm-up and Cool-down  Performed as group-led instruction    Resistance Training Performed  Yes    VAD Patient?  No      Pain Assessment   Currently in Pain?  No/denies    Multiple Pain Sites  No       Capillary Blood Glucose: Results for orders placed or performed during the hospital encounter of 06/24/17 (from the past 24 hour(s))  Basic metabolic panel     Status: Abnormal   Collection Time: 06/24/17 11:50 AM  Result Value Ref Range   Sodium 137 135 - 145 mmol/L   Potassium 3.6 3.5 - 5.1 mmol/L   Chloride 101 101 - 111 mmol/L   CO2 24 22 - 32 mmol/L   Glucose, Bld 257 (H) 65 - 99 mg/dL   BUN 7 6 - 20 mg/dL   Creatinine, Ser 0.69 0.44 - 1.00 mg/dL   Calcium 8.8 (L) 8.9 - 10.3 mg/dL   GFR calc non Af Amer >60 >60 mL/min   GFR calc Af Amer >60 >60 mL/min   Anion gap 12 5 - 15      Social History   Tobacco Use  Smoking Status Current Every Day Smoker  . Packs/day: 0.25  . Years: 4.00  . Pack years: 1.00  Smokeless Tobacco Never Used  Tobacco Comment   smokes two a day    Goals Met:  Exercise tolerated well No report of cardiac concerns or symptoms Strength training completed today  Goals Unmet:  Not  Applicable  Comments: Service time is from 1330 to 1545    Dr. Rush Farmer is Medical Director for Pulmonary Rehab at [facility].

## 2017-06-24 NOTE — Patient Instructions (Addendum)
Follow up 3-4 months with Amy Clegg NP-C. We will call you closer to this time, or you may call our office to schedule 1 month before you are due to be seen. Take all medication as prescribed the day of your appointment. Bring all medications with you to your appointment.  Do the following things EVERYDAY: 1) Weigh yourself in the morning before breakfast. Write it down and keep it in a log. 2) Take your medicines as prescribed 3) Eat low salt foods-Limit salt (sodium) to 2000 mg per day.  4) Stay as active as you can everyday 5) Limit all fluids for the day to less than 2 liters

## 2017-06-29 ENCOUNTER — Telehealth (HOSPITAL_COMMUNITY): Payer: Self-pay | Admitting: Family Medicine

## 2017-06-29 ENCOUNTER — Encounter (HOSPITAL_COMMUNITY): Payer: BC Managed Care – PPO

## 2017-07-01 ENCOUNTER — Encounter (HOSPITAL_COMMUNITY): Payer: Self-pay

## 2017-07-01 ENCOUNTER — Telehealth (HOSPITAL_COMMUNITY): Payer: Self-pay | Admitting: Family Medicine

## 2017-07-01 ENCOUNTER — Encounter (HOSPITAL_COMMUNITY): Payer: BC Managed Care – PPO

## 2017-07-06 ENCOUNTER — Encounter (HOSPITAL_COMMUNITY)
Admission: RE | Admit: 2017-07-06 | Discharge: 2017-07-06 | Disposition: A | Discharge status: Home / Self Care | Payer: BC Managed Care – PPO | Source: Ambulatory Visit | Attending: Internal Medicine | Admitting: Internal Medicine

## 2017-07-06 DIAGNOSIS — I5022 Chronic systolic (congestive) heart failure: Secondary | ICD-10-CM

## 2017-07-06 NOTE — Progress Notes (Signed)
Daily Session Note  Patient Details  Name: [name] Bean MRN: [id number] Date of Birth: 06/12/1950 Referring Provider:     Pulmonary Rehab Walk Test from 05/13/2017 in [facility]  Referring Provider  Dr. Bensimhon      Encounter Date: 07/06/2017  Check In: Session Check In - 07/06/17 1330      Check-In   Location  [facility]    Staff Present  Molly diVincenzo, MS, ACSM RCEP, Exercise Physiologist;Lisa Hughes, RN;Portia Payne, RN, BSN    Supervising physician immediately available to respond to emergencies  [facility] immediately available    Physician(s)  Dr. Nettey    Medication changes reported      No    Fall or balance concerns reported     No    Tobacco Cessation  No Change    Warm-up and Cool-down  Performed as group-led instruction    Resistance Training Performed  Yes    VAD Patient?  No      Pain Assessment   Currently in Pain?  No/denies    Multiple Pain Sites  No       Capillary Blood Glucose: No results found for this or any previous visit (from the past 24 hour(s)).    Social History   Tobacco Use  Smoking Status Current Every Day Smoker  . Packs/day: 0.25  . Years: 4.00  . Pack years: 1.00  Smokeless Tobacco Never Used  Tobacco Comment   smokes two a day    Goals Met:  Exercise tolerated well No report of cardiac concerns or symptoms Strength training completed today  Goals Unmet:  Not Applicable  Comments: Severity of Illness: Service time is from 1330 to 1515     Dr. Wesam G. Yacoub is Medical Director for Pulmonary Rehab at [facility]. 

## 2017-07-08 ENCOUNTER — Encounter (HOSPITAL_COMMUNITY): Payer: BC Managed Care – PPO

## 2017-07-13 ENCOUNTER — Encounter (HOSPITAL_COMMUNITY)
Admission: RE | Admit: 2017-07-13 | Discharge: 2017-07-13 | Disposition: A | Discharge status: Home / Self Care | Payer: BC Managed Care – PPO | Source: Ambulatory Visit | Attending: Internal Medicine | Admitting: Internal Medicine

## 2017-07-13 VITALS — Wt 196.0 lb

## 2017-07-13 DIAGNOSIS — I5022 Chronic systolic (congestive) heart failure: Secondary | ICD-10-CM | POA: Diagnosis not present

## 2017-07-13 NOTE — Progress Notes (Signed)
Daily Session Note  Patient Details  Name: [name] Bean MRN: [id number] Date of Birth: 1950/09/16 Referring Provider:     Pulmonary Rehab Walk Test from 05/13/2017 in [place]  Referring Provider  Dr. Haroldine Laws      Encounter Date: 07/13/2017  Check In: Session Check In - 07/13/17 1525      Check-In   Location  [facility]    Staff Present  Trish Fountain, RN, BSN;Molly diVincenzo, MS, ACSM RCEP, Exercise Physiologist;Callie Bunyard Ysidro Evert, RN    Supervising physician immediately available to respond to emergencies  [facility] immediately available    Physician(s)  Dr. Posey Pronto    Medication changes reported      No    Fall or balance concerns reported     No    Tobacco Cessation  No Change    Warm-up and Cool-down  Performed as group-led instruction    Resistance Training Performed  Yes    VAD Patient?  No      Pain Assessment   Currently in Pain?  No/denies    Multiple Pain Sites  No       Capillary Blood Glucose: No results found for this or any previous visit (from the past 24 hour(s)). POCT Glucose - 07/13/17 1559      POCT Blood Glucose   Pre-Exercise  240 mg/dL    Post-Exercise  220 mg/dL      Exercise Prescription Changes - 07/13/17 1500      Response to Exercise   Blood Pressure (Admit)  130/60    Blood Pressure (Exercise)  140/64    Blood Pressure (Exit)  122/88    Heart Rate (Admit)  79 bpm    Heart Rate (Exercise)  112 bpm    Heart Rate (Exit)  99 bpm    Oxygen Saturation (Admit)  100 %    Oxygen Saturation (Exercise)  97 %    Oxygen Saturation (Exit)  99 %    Rating of Perceived Exertion (Exercise)  13    Perceived Dyspnea (Exercise)  1    Duration  Progress to 45 minutes of aerobic exercise without signs/symptoms of physical distress    Intensity  THRR unchanged      Progression   Progression  Continue to progress workloads to maintain intensity without signs/symptoms of physical distress.      Resistance Training   Training Prescription  Yes    Weight  orange bands    Reps  10-15    Time  10 Minutes      Recumbant Bike   Level  2    Minutes  34      Track   Laps  6    Minutes  17       Social History   Tobacco Use  Smoking Status Current Every Day Smoker  . Packs/day: 0.25  . Years: 4.00  . Pack years: 1.00  Smokeless Tobacco Never Used  Tobacco Comment   smokes two a day    Goals Met:  Exercise tolerated well No report of cardiac concerns or symptoms Strength training completed today  Goals Unmet:  Not Applicable  Comments: Service time is from 1330 to 1515    Dr. Rush Farmer is Medical Director for Pulmonary Rehab at [facility].

## 2017-07-15 ENCOUNTER — Encounter (HOSPITAL_COMMUNITY): Payer: BC Managed Care – PPO

## 2017-07-18 ENCOUNTER — Other Ambulatory Visit (HOSPITAL_COMMUNITY): Payer: Self-pay | Admitting: Internal Medicine

## 2017-07-20 ENCOUNTER — Encounter (HOSPITAL_COMMUNITY)
Admission: RE | Admit: 2017-07-20 | Discharge: 2017-07-20 | Disposition: A | Discharge status: Home / Self Care | Payer: BC Managed Care – PPO | Source: Ambulatory Visit | Attending: Internal Medicine | Admitting: Internal Medicine

## 2017-07-20 DIAGNOSIS — I5022 Chronic systolic (congestive) heart failure: Secondary | ICD-10-CM

## 2017-07-20 NOTE — Progress Notes (Addendum)
Daily Session Note  Patient Details  Name: [name] Bean MRN: [id number] Date of Birth: [birth year] Referring Provider:     Pulmonary Rehab Walk Test from 05/13/2017 in [place]  Referring Provider  Dr. Haroldine Laws      Encounter Date: 07/20/2017  Check In: Session Check In - 07/20/17 1518      Check-In   Location  [facility]    Staff Present  Trish Fountain, RN, BSN;Molly diVincenzo, MS, ACSM RCEP, Exercise Physiologist;Kalven Ganim Ysidro Evert, RN    Supervising physician immediately available to respond to emergencies  [facility] immediately available    Physician(s)  Dr. Lonny Prude    Medication changes reported      No    Fall or balance concerns reported     No    Tobacco Cessation  No Change    Warm-up and Cool-down  Performed as group-led instruction    Resistance Training Performed  Yes    VAD Patient?  No      Pain Assessment   Currently in Pain?  No/denies    Multiple Pain Sites  No       Capillary Blood Glucose: No results found for this or any previous visit (from the past 24 hour(s)).    Social History   Tobacco Use  Smoking Status Current Every Day Smoker  . Packs/day: 0.25  . Years: 4.00  . Pack years: 1.00  Smokeless Tobacco Never Used  Tobacco Comment   smokes two a day    Goals Met:  Exercise tolerated well No report of cardiac concerns or symptoms Strength training completed today  Goals Unmet:  Not Applicable  Comments: Service time is from 1330 to 1500    Dr. Rush Farmer is Medical Director for Pulmonary Rehab at [facility].

## 2017-07-20 NOTE — Progress Notes (Signed)
Pulmonary Individual Treatment Plan  Patient Details  Name: [name] Bean MRN: [id number] Date of Birth: 1950-10-20 Referring Provider:     Pulmonary Rehab Walk Test from 05/13/2017 in [place]  Referring Provider  Dr. Haroldine Laws      Initial Encounter Date:    Pulmonary Rehab Walk Test from 05/13/2017 in [place]  Date  05/17/17  Referring Provider  Dr. Haroldine Laws      Visit Diagnosis: Chronic systolic HF (heart failure) ([place])  Patient's Home Medications on Admission:   Current Outpatient Medications:  .  aspirin EC 81 MG tablet, Take 81 mg by mouth daily. , Disp: , Rfl:  .  BD PEN NEEDLE NANO U/F 32G X 4 MM MISC, USE TO INJECT INSULIN 5 TIMES A DAY, Disp: 100 each, Rfl: 5 .  bimatoprost (LUMIGAN) 0.01 % SOLN, Lumigan 0.01 % eye drops  PUT 1 DROP INTO BOTH EYES AT BEDTIME, Disp: , Rfl:  .  Blood Glucose Monitoring Suppl (ACCU-CHEK COMPACT CARE KIT) KIT, Accu-Chek Compact Plus Care kit  USE AS INSTRUCTED., Disp: , Rfl:  .  carvedilol (COREG) 25 MG tablet, Take 1 tablet (25 mg total) by mouth 2 (two) times daily with a meal., Disp: 60 tablet, Rfl: 6 .  cyclobenzaprine (FLEXERIL) 5 MG tablet, cyclobenzaprine 5 mg tablet  TAKE 1 TABLET (5 MG TOTAL) BY MOUTH 3 (THREE) TIMES DAILY AS NEEDED FOR MUSCLE SPASMS., Disp: , Rfl:  .  escitalopram (LEXAPRO) 20 MG tablet, Take 1 tablet (20 mg total) by mouth daily., Disp: 90 tablet, Rfl: 3 .  glucose blood (FREESTYLE TEST STRIPS) test strip, 1 each by Other route daily. DX E11.9, Disp: 100 each, Rfl: 12 .  hydrALAZINE (APRESOLINE) 50 MG tablet, Take 50-100 mg by mouth See admin instructions. Take 100 mg by mouth in the morning and take 50 mg by mouth in the evening, Disp: , Rfl:  .  insulin aspart (NOVOLOG) 100 UNIT/ML injection, Inject 12 Units into the skin 3 (three) times daily before meals., Disp: 10 mL, Rfl: 11 .  Insulin Glargine (BASAGLAR KWIKPEN) 100 UNIT/ML SOPN,  INJECT 26 UNITS SUBCUTANEOUSLY NIGHTLY, Disp: 15 mL, Rfl: 11 .  latanoprost (XALATAN) 0.005 % ophthalmic solution, Place 1 drop into both eyes at bedtime., Disp: , Rfl:  .  losartan (COZAAR) 100 MG tablet, Take 100 mg by mouth daily., Disp: , Rfl:  .  Melatonin 3 MG TABS, Take 3 mg by mouth at bedtime as needed (sleep)., Disp: , Rfl:  .  rosuvastatin (CRESTOR) 20 MG tablet, Take 20 mg by mouth daily., Disp: , Rfl:  .  spironolactone (ALDACTONE) 25 MG tablet, Take 25 mg by mouth daily., Disp: , Rfl:  .  torsemide (DEMADEX) 20 MG tablet, Take 20-40 mg by mouth See admin instructions. Take 20 mg by mouth daily and may take 20-40 mg by mouth as needed for swelling, Disp: , Rfl:  .  varenicline (CHANTIX CONTINUING MONTH PAK) 1 MG tablet, Take 1 tablet (1 mg total) by mouth 2 (two) times daily., Disp: 60 tablet, Rfl: 6  Past Medical History: Past Medical History:  Diagnosis Date  . Anemia   . Anxiety   . Arthritis   . Asthma   . Broken arm    left arm  . Bronchitis   . Bursitis of left hip   . CAD (coronary artery disease)   . Candidiasis, vagina   . Cardiac arrest ([place]) 06/20/2016   at [facility]; ?  due to flash pulm edema vs undertreated chronic CHF  . Cerumen impaction   . CHF (congestive heart failure) ([place])    RECENT ADMIT TO [facility]   . Colon, diverticulosis   . Depression   . Diabetes mellitus    15 YRS AGO  . Fatigue   . Gastroenteritis   . Hot flashes, menopausal   . Hyperlipidemia   . Knee pain, left   . Lumbar back pain   . Maxillary sinusitis    history  . Muscle tear    right gluteus  . Nausea   . Other B-complex deficiencies   . Otitis media, acute    left  . Peripheral neuropathy   . Spinal stenosis of lumbar region   . Tubulovillous adenoma of colon   . Unspecified essential hypertension     Tobacco Use: Social History   Tobacco Use  Smoking Status Current Every Day Smoker  . Packs/day: 0.25  . Years: 4.00  . Pack years: 1.00  Smokeless Tobacco Never Used   Tobacco Comment   smokes two a day    Labs: Recent Review Flowsheet Data    Labs for ITP Cardiac and Pulmonary Rehab Latest Ref Rng & Units 10/28/2010 01/02/2011 09/09/2016 10/19/2016 04/15/2017   Cholestrol 0 - 200 mg/dL - 190 - - -   LDLCALC 0 - 99 mg/dL - 104(H) - - -   HDL >39.00 mg/dL - 52.30 - - -   Trlycerides 0.0 - 149.0 mg/dL - 167.0(H) - - -   Hemoglobin A1c - 8.4(H) 8.1(H) 8.1(H) 7.6(H) 9.1      Capillary Blood Glucose: Lab Results  Component Value Date   GLUCAP 149 (H) 06/03/2017   GLUCAP 91 05/20/2017   GLUCAP 79 05/20/2017   GLUCAP 222 (H) 10/24/2016   GLUCAP 269 (H) 10/23/2016   POCT Glucose    Row Name 07/01/17 1414 07/13/17 1559           POCT Blood Glucose   Pre-Exercise  209 mg/dL  240 mg/dL      Post-Exercise  166 mg/dL  220 mg/dL         Pulmonary Assessment Scores: Pulmonary Assessment Scores    Row Name 05/17/17 0715 05/26/17 1351       ADL UCSD   ADL Phase  Entry  Entry    SOB Score total  -  1      CAT Score   CAT Score  -  4 Entry      mMRC Score   mMRC Score  0  -       Pulmonary Function Assessment: Pulmonary Function Assessment - 05/10/17 1036      Breath   Bilateral Breath Sounds  Clear    Shortness of Breath  Yes on occasion       Exercise Target Goals:    Exercise Program Goal: Individual exercise prescription set using results from initial 6 min walk test and THRR while considering  patient's activity barriers and safety.    Exercise Prescription Goal: Initial exercise prescription builds to 30-45 minutes a day of aerobic activity, 2-3 days per week.  Home exercise guidelines will be given to patient during program as part of exercise prescription that the participant will acknowledge.  Activity Barriers & Risk Stratification: Activity Barriers & Cardiac Risk Stratification - 05/10/17 1021      Activity Barriers & Cardiac Risk Stratification   Activity Barriers  Neck/Spine Problems;Back  Problems;Deconditioning;Assistive Device;History of Falls;Balance Concerns  6 Minute Walk: 6 Minute Walk    Row Name 05/17/17 0708         6 Minute Walk   Phase  Initial     Distance  710 feet     Walk Time  6 minutes     # of Rest Breaks  0     MPH  1.34     METS  2     RPE  13     Perceived Dyspnea   0     Symptoms  Yes (comment)     Comments  used wheelchair, calf and hip pain-patient stated from stiffness     Resting HR  84 bpm     Resting BP  140/77     Resting Oxygen Saturation   97 %     Exercise Oxygen Saturation  during 6 min walk  97 %     Max Ex. HR  118 bpm     Max Ex. BP  143/76       Interval HR   1 Minute HR  94     2 Minute HR  86     3 Minute HR  104     4 Minute HR  110     5 Minute HR  118     6 Minute HR  101     2 Minute Post HR  95     Interval Heart Rate?  Yes       Interval Oxygen   Interval Oxygen?  Yes     Baseline Oxygen Saturation %  97 %     1 Minute Oxygen Saturation %  98 %     1 Minute Liters of Oxygen  0 L     2 Minute Oxygen Saturation %  98 %     2 Minute Liters of Oxygen  0 L     3 Minute Oxygen Saturation %  97 %     3 Minute Liters of Oxygen  0 L     4 Minute Oxygen Saturation %  98 %     4 Minute Liters of Oxygen  0 L     5 Minute Oxygen Saturation %  98 %     5 Minute Liters of Oxygen  0 L     6 Minute Oxygen Saturation %  99 %     6 Minute Liters of Oxygen  0 L     2 Minute Post Oxygen Saturation %  97 %     2 Minute Post Liters of Oxygen  0 L        Oxygen Initial Assessment: Oxygen Initial Assessment - 05/17/17 0708      Initial 6 min Walk   Oxygen Used  None      Program Oxygen Prescription   Program Oxygen Prescription  None       Oxygen Re-Evaluation: Oxygen Re-Evaluation    Row Name 05/21/17 1013 06/21/17 0841 07/16/17 1522         Program Oxygen Prescription   Program Oxygen Prescription  None  None  None       Home Oxygen   Home Oxygen Device  None  None  None     Sleep Oxygen  Prescription  None  None  None     Home Exercise Oxygen Prescription  None  None  None     Home at Rest Exercise Oxygen Prescription  None  None  None  Oxygen Discharge (Final Oxygen Re-Evaluation): Oxygen Re-Evaluation - 07/16/17 1522      Program Oxygen Prescription   Program Oxygen Prescription  None      Home Oxygen   Home Oxygen Device  None    Sleep Oxygen Prescription  None    Home Exercise Oxygen Prescription  None    Home at Rest Exercise Oxygen Prescription  None       Initial Exercise Prescription: Initial Exercise Prescription - 05/17/17 0700      Date of Initial Exercise RX and Referring Provider   Date  05/17/17    Referring Provider  Dr. Haroldine Laws      Recumbant Bike   Level  2    Watts  20    Minutes  17      NuStep   Level  2    SPM  80    Minutes  17      Track   Laps  5    Minutes  17      Prescription Details   Frequency (times per week)  2    Duration  Progress to 45 minutes of aerobic exercise without signs/symptoms of physical distress      Intensity   THRR 40-80% of Max Heartrate  62-125    Ratings of Perceived Exertion  11-13    Perceived Dyspnea  0-4      Progression   Progression  Continue progressive overload as per policy without signs/symptoms or physical distress.      Resistance Training   Training Prescription  Yes    Weight  orange bands    Reps  10-15       Perform Capillary Blood Glucose checks as needed.  Exercise Prescription Changes: Exercise Prescription Changes    Row Name 06/01/17 1500 06/15/17 1500 06/24/17 1412 07/13/17 1500       Response to Exercise   Blood Pressure (Admit)  130/50  116/50  106/46  130/60    Blood Pressure (Exercise)  150/70  138/58  120/60  140/64    Blood Pressure (Exit)  102/62  114/64  96/50  122/88    Heart Rate (Admit)  93 bpm  89 bpm  75 bpm  79 bpm    Heart Rate (Exercise)  111 bpm  100 bpm  86 bpm  112 bpm    Heart Rate (Exit)  92 bpm  80 bpm  76 bpm  99 bpm     Oxygen Saturation (Admit)  99 %  96 %  97 %  100 %    Oxygen Saturation (Exercise)  96 %  96 %  98 %  97 %    Oxygen Saturation (Exit)  97 %  97 %  98 %  99 %    Rating of Perceived Exertion (Exercise)  _0 Perceived Dyspnea (Exercise)  1  0  0  1    Duration  Progress to 45 minutes of aerobic exercise without signs/symptoms of physical distress  Progress to 45 minutes of aerobic exercise without signs/symptoms of physical distress  Progress to 45 minutes of aerobic exercise without signs/symptoms of physical distress  Progress to 45 minutes of aerobic exercise without signs/symptoms of physical distress    Intensity  Other (comment) 40-80% HRR  Other (comment) 40-80% HRR  Other (comment) 40-80% HRR  THRR unchanged      Progression   Progression  Continue to progress workloads to maintain intensity without  signs/symptoms of physical distress.  Continue to progress workloads to maintain intensity without signs/symptoms of physical distress.  Continue to progress workloads to maintain intensity without signs/symptoms of physical distress.  Continue to progress workloads to maintain intensity without signs/symptoms of physical distress.      Resistance Training   Training Prescription  Yes  Yes  Yes  Yes    Weight  orange bands  orange bands  orange bands  orange bands    Reps  10-15  10-15  10-15  10-15    Time  -  -  -  10 Minutes      Recumbant Bike   Level  -  -  -  2    Minutes  -  -  -  34      NuStep   Level  _0 -    SPM  80  80  80  -    Minutes  _1 -      Arm Ergometer   Level  1  1  -  -    Minutes  17  17  -  -      Track   Laps  _2 Minutes  -  _3 Exercise Comments:   Exercise Goals and Review: Exercise Goals    Row Name 05/10/17 1022             Exercise Goals   Increase Physical Activity  Yes       Intervention  Provide advice, education, support and counseling about physical activity/exercise  needs.;Develop an individualized exercise prescription for aerobic and resistive training based on initial evaluation findings, risk stratification, comorbidities and participant's personal goals.       Expected Outcomes  Short Term: Attend rehab on a regular basis to increase amount of physical activity.;Long Term: Add in home exercise to make exercise part of routine and to increase amount of physical activity.;Long Term: Exercising regularly at least 3-5 days a week.       Increase Strength and Stamina  Yes       Intervention  Provide advice, education, support and counseling about physical activity/exercise needs.;Develop an individualized exercise prescription for aerobic and resistive training based on initial evaluation findings, risk stratification, comorbidities and participant's personal goals.       Expected Outcomes  Short Term: Increase workloads from initial exercise prescription for resistance, speed, and METs.       Able to understand and use rate of perceived exertion (RPE) scale  Yes       Intervention  Provide education and explanation on how to use RPE scale       Expected Outcomes  Short Term: Able to use RPE daily in rehab to express subjective intensity level;Long Term:  Able to use RPE to guide intensity level when exercising independently       Able to understand and use Dyspnea scale  Yes       Intervention  Provide education and explanation on how to use Dyspnea scale       Expected Outcomes  Long Term: Able to use Dyspnea scale to guide intensity level when exercising independently;Short Term: Able to use Dyspnea scale daily in rehab to express subjective sense of shortness of breath during exertion       Knowledge and understanding of Target Heart  Rate Range (THRR)  Yes       Intervention  Provide education and explanation of THRR including how the numbers were predicted and where they are located for reference       Expected Outcomes  Short Term: Able to state/look up  THRR;Short Term: Able to use daily as guideline for intensity in rehab;Long Term: Able to use THRR to govern intensity when exercising independently       Understanding of Exercise Prescription  Yes       Intervention  Provide education, explanation, and written materials on patient's individual exercise prescription       Expected Outcomes  Short Term: Able to explain program exercise prescription;Long Term: Able to explain home exercise prescription to exercise independently          Exercise Goals Re-Evaluation : Exercise Goals Re-Evaluation    Row Name 05/21/17 1013 06/21/17 0841 07/16/17 1522         Exercise Goal Re-Evaluation   Exercise Goals Review  Increase Strength and Stamina;Able to understand and use Dyspnea scale;Increase Physical Activity;Able to understand and use rate of perceived exertion (RPE) scale;Knowledge and understanding of Target Heart Rate Range (THRR);Understanding of Exercise Prescription  Increase Strength and Stamina;Able to understand and use Dyspnea scale;Increase Physical Activity;Able to understand and use rate of perceived exertion (RPE) scale;Knowledge and understanding of Target Heart Rate Range (THRR);Understanding of Exercise Prescription  Increase Strength and Stamina;Able to understand and use Dyspnea scale;Increase Physical Activity;Able to understand and use rate of perceived exertion (RPE) scale;Knowledge and understanding of Target Heart Rate Range (THRR);Understanding of Exercise Prescription     Comments  Patient arrived for her first day of exercise but her blood sugar was too low. Will cont. to educate and motivate.  Due to patient's deconditioning, her progress has been slow. She is limited by her chronic pain. Patient has only been able to completed 5-6 laps (200 ft) in 15 minutes. I will cont. to progress patient as tolerated. I will also go over home exercise and stress its importance.  Due to patient's deconditioning, her progress has been slow.  She is limited by her chronic pain. Patient has only been able to completed 5-6 laps (200 ft) in 15 minutes. I will cont. to progress patient as tolerated. I will also go over home exercise and stress its importance.     Expected Outcomes  Through exercise at rehab and at home, patient will increase strength and stamina and ADL's will be easier to perform.   Through exercise at rehab, the patient will increase strength and stamina and gain the confidence to establish an exercise regime at home. Through exercise, patient will gain a greater quality of life with ADL's being easier to perform.   Through exercise at rehab, the patient will increase strength and stamina and gain the confidence to establish an exercise regime at home. Through exercise, patient will gain a greater quality of life with ADL's being easier to perform.         Discharge Exercise Prescription (Final Exercise Prescription Changes): Exercise Prescription Changes - 07/13/17 1500      Response to Exercise   Blood Pressure (Admit)  130/60    Blood Pressure (Exercise)  140/64    Blood Pressure (Exit)  122/88    Heart Rate (Admit)  79 bpm    Heart Rate (Exercise)  112 bpm    Heart Rate (Exit)  99 bpm    Oxygen Saturation (Admit)  100 %  Oxygen Saturation (Exercise)  97 %    Oxygen Saturation (Exit)  99 %    Rating of Perceived Exertion (Exercise)  13    Perceived Dyspnea (Exercise)  1    Duration  Progress to 45 minutes of aerobic exercise without signs/symptoms of physical distress    Intensity  THRR unchanged      Progression   Progression  Continue to progress workloads to maintain intensity without signs/symptoms of physical distress.      Resistance Training   Training Prescription  Yes    Weight  orange bands    Reps  10-15    Time  10 Minutes      Recumbant Bike   Level  2    Minutes  34      Track   Laps  6    Minutes  17       Nutrition:  Target Goals: Understanding of nutrition guidelines, daily  intake of sodium <1576m, cholesterol <2028m calories 30% from fat and 7% or less from saturated fats, daily to have 5 or more servings of fruits and vegetables.  Biometrics: Pre Biometrics - 05/17/17 0708      Pre Biometrics   Grip Strength  18 kg        Nutrition Therapy Plan and Nutrition Goals: Nutrition Therapy & Goals - 06/04/17 1221      Nutrition Therapy   Diet  Carb Modified, Heart Healthy Low Sodium       Personal Nutrition Goals   Nutrition Goal  Identify food quantities necessary to achieve wt loss of  -2# per week to a goal wt loss of 2.7-10.9 kg (6-24 lb) at graduation from pulmonary rehab.    Personal Goal #2  Describe the benefit of including fruits, vegetables, whole grains, and low-fat dairy products in a healthy meal plan.    Personal Goal #3  Improved glycemic control as evidenced by pt's A1c trending from 9.1 to less than 7      Intervention Plan   Intervention  Prescribe, educate and counsel regarding individualized specific dietary modifications aiming towards targeted core components such as weight, hypertension, lipid management, diabetes, heart failure and other comorbidities.    Expected Outcomes  Short Term Goal: Understand basic principles of dietary content, such as calories, fat, sodium, cholesterol and nutrients.;Long Term Goal: Adherence to prescribed nutrition plan.       Nutrition Assessments: Nutrition Assessments - 06/04/17 1219      Rate Your Plate Scores   Pre Score  47       Nutrition Goals Re-Evaluation:   Nutrition Goals Discharge (Final Nutrition Goals Re-Evaluation):   Psychosocial: Target Goals: Acknowledge presence or absence of significant depression and/or stress, maximize coping skills, provide positive support system. Participant is able to verbalize types and ability to use techniques and skills needed for reducing stress and depression.  Initial Review & Psychosocial Screening: Initial Psych Review & Screening -  05/10/17 1039      Initial Review   Current issues with  Current Depression;Current Stress Concerns    Source of Stress Concerns  Chronic Illness      Family Dynamics   Good Support System?  Yes    Comments  patient feels her depression is under controll and it will not be a barrier to participation in pulmonary rehab      Barriers   Psychosocial barriers to participate in program  The patient should benefit from training in stress management and relaxation.  Screening Interventions   Interventions  Encouraged to exercise       Quality of Life Scores:  Scores of 19 and below usually indicate a poorer quality of life in these areas.  A difference of  2-3 points is a clinically meaningful difference.  A difference of 2-3 points in the total score of the Quality of Life Index has been associated with significant improvement in overall quality of life, self-image, physical symptoms, and general health in studies assessing change in quality of life.   PHQ-9: Recent Review Flowsheet Data    Depression screen [facility] 2/9 05/10/2017   Decreased Interest 1   Down, Depressed, Hopeless 1   PHQ - 2 Score 2   Altered sleeping 1   Tired, decreased energy 1   Change in appetite 1   Feeling bad or failure about yourself  1   Trouble concentrating 0   Moving slowly or fidgety/restless 0   Suicidal thoughts 0   PHQ-9 Score 6   Difficult doing work/chores Not difficult at all     Interpretation of Total Score  Total Score Depression Severity:  1-4 = Minimal depression, 5-9 = Mild depression, 10-14 = Moderate depression, 15-19 = Moderately severe depression, 20-27 = Severe depression   Psychosocial Evaluation and Intervention:   Psychosocial Re-Evaluation: Psychosocial Re-Evaluation    Row Name 05/25/17 0949 06/14/17 1400 07/20/17 6295         Psychosocial Re-Evaluation   Current issues with  Current Depression;History of Depression;Current Stress Concerns  Current Depression;History  of Depression  Current Depression;History of Depression     Comments  just started program, no change in psychosocial issues  just started program, no change in psychosocial issues  patient states depression symptoms are stable     Expected Outcomes  no barriers to participation in pulmonary rehab  no barriers to participation in pulmonary rehab  no barriers to participation in pulmonary rehab     Interventions  Encouraged to attend Pulmonary Rehabilitation for the exercise  Encouraged to attend Pulmonary Rehabilitation for the exercise  Encouraged to attend Pulmonary Rehabilitation for the exercise     Continue Psychosocial Services   Follow up required by staff  Follow up required by staff  Follow up required by staff     Comments  no change just began program  no change just began program  -       Initial Review   Source of Stress Concerns  Chronic Illness  Chronic Illness  -        Psychosocial Discharge (Final Psychosocial Re-Evaluation): Psychosocial Re-Evaluation - 07/20/17 2841      Psychosocial Re-Evaluation   Current issues with  Current Depression;History of Depression    Comments  patient states depression symptoms are stable    Expected Outcomes  no barriers to participation in pulmonary rehab    Interventions  Encouraged to attend Pulmonary Rehabilitation for the exercise    Continue Psychosocial Services   Follow up required by staff       Education: Education Goals: Education classes will be provided on a weekly basis, covering required topics. Participant will state understanding/return demonstration of topics presented.  Learning Barriers/Preferences: Learning Barriers/Preferences - 05/10/17 1035      Learning Barriers/Preferences   Learning Barriers  None    Learning Preferences  Written Material;Audio       Education Topics: Risk Factor Reduction:  -Group instruction that is supported by a PowerPoint presentation. Instructor discusses the definition of a risk  factor, different risk factors for pulmonary disease, and how the heart and lungs work together.     Nutrition for Pulmonary Patient:  -Group instruction provided by PowerPoint slides, verbal discussion, and written materials to support subject matter. The instructor gives an explanation and review of healthy diet recommendations, which includes a discussion on weight management, recommendations for fruit and vegetable consumption, as well as protein, fluid, caffeine, fiber, sodium, sugar, and alcohol. Tips for eating when patients are short of breath are discussed.   Pursed Lip Breathing:  -Group instruction that is supported by demonstration and informational handouts. Instructor discusses the benefits of pursed lip and diaphragmatic breathing and detailed demonstration on how to preform both.     Oxygen Safety:  -Group instruction provided by PowerPoint, verbal discussion, and written material to support subject matter. There is an overview of "What is Oxygen" and "Why do we need it".  Instructor also reviews how to create a safe environment for oxygen use, the importance of using oxygen as prescribed, and the risks of noncompliance. There is a brief discussion on traveling with oxygen and resources the patient may utilize.   PULMONARY REHAB OTHER RESPIRATORY from 06/17/2017 in [place]  Date  05/20/17  Educator  RN  Instruction Review Code  2- Demonstrated Understanding      Oxygen Equipment:  -Group instruction provided by [facility] Staff utilizing handouts, written materials, and equipment demonstrations.   PULMONARY REHAB OTHER RESPIRATORY from 06/17/2017 in [place]  Date  05/27/17  Educator  [facility]  Instruction Review Code  1- Verbalizes Understanding      Signs and Symptoms:  -Group instruction provided by written material and verbal discussion to support subject matter. Warning signs and symptoms of  infection, stroke, and heart attack are reviewed and when to call the physician/911 reinforced. Tips for preventing the spread of infection discussed.   Advanced Directives:  -Group instruction provided by verbal instruction and written material to support subject matter. Instructor reviews Advanced Directive laws and proper instruction for filling out document.   Pulmonary Video:  -Group video education that reviews the importance of medication and oxygen compliance, exercise, good nutrition, pulmonary hygiene, and pursed lip and diaphragmatic breathing for the pulmonary patient.   Exercise for the Pulmonary Patient:  -Group instruction that is supported by a PowerPoint presentation. Instructor discusses benefits of exercise, core components of exercise, frequency, duration, and intensity of an exercise routine, importance of utilizing pulse oximetry during exercise, safety while exercising, and options of places to exercise outside of rehab.     PULMONARY REHAB OTHER RESPIRATORY from 06/17/2017 in [place]  Date  06/03/17  Educator  EP  Instruction Review Code  1- Verbalizes Understanding      Pulmonary Medications:  -Verbally interactive group education provided by instructor with focus on inhaled medications and proper administration.   Anatomy and Physiology of the Respiratory System and Intimacy:  -Group instruction provided by PowerPoint, verbal discussion, and written material to support subject matter. Instructor reviews respiratory cycle and anatomical components of the respiratory system and their functions. Instructor also reviews differences in obstructive and restrictive respiratory diseases with examples of each. Intimacy, Sex, and Sexuality differences are reviewed with a discussion on how relationships can change when diagnosed with pulmonary disease. Common sexual concerns are reviewed.   MD DAY -A group question and answer session with a  medical doctor that allows participants to ask questions that  relate to their pulmonary disease state.   OTHER EDUCATION -Group or individual verbal, written, or video instructions that support the educational goals of the pulmonary rehab program.   Holiday Eating Survival Tips:  -Group instruction provided by PowerPoint slides, verbal discussion, and written materials to support subject matter. The instructor gives patients tips, tricks, and techniques to help them not only survive but enjoy the holidays despite the onslaught of food that accompanies the holidays.   Knowledge Questionnaire Score: Knowledge Questionnaire Score - 05/26/17 1350      Knowledge Questionnaire Score   Pre Score  13/18       Core Components/Risk Factors/Patient Goals at Admission: Personal Goals and Risk Factors at Admission - 05/10/17 1037      Core Components/Risk Factors/Patient Goals on Admission    Weight Management  Yes;Obesity    Intervention  Obesity: Provide education and appropriate resources to help participant work on and attain dietary goals.;Weight Management/Obesity: Establish reasonable short term and long term weight goals.    Expected Outcomes  Short Term: Continue to assess and modify interventions until short term weight is achieved;Long Term: Adherence to nutrition and physical activity/exercise program aimed toward attainment of established weight goal;Weight Loss: Understanding of general recommendations for a balanced deficit meal plan, which promotes 1-2 lb weight loss per week and includes a negative energy balance of [id number] kcal/d;Understanding recommendations for meals to include 15-35% energy as protein, 25-35% energy from fat, 35-60% energy from carbohydrates, less than 2106m of dietary cholesterol, 20-35 gm of total fiber daily;Understanding of distribution of calorie intake throughout the day with the consumption of 4-5 meals/snacks    Tobacco Cessation  Yes    Intervention   Assist the participant in steps to quit. Provide individualized education and counseling about committing to Tobacco Cessation, relapse prevention, and pharmacological support that can be provided by physician.;OAdvice worker assist with locating and accessing local/national Quit Smoking programs, and support quit date choice.    Expected Outcomes  Short Term: Will demonstrate readiness to quit, by selecting a quit date.;Short Term: Will quit all tobacco product use, adhering to prevention of relapse plan.;Long Term: Complete abstinence from all tobacco products for at least 12 months from quit date.    Heart Failure  Yes    Intervention  Provide a combined exercise and nutrition program that is supplemented with education, support and counseling about heart failure. Directed toward relieving symptoms such as shortness of breath, decreased exercise tolerance, and extremity edema.    Expected Outcomes  Improve functional capacity of life;Short term: Attendance in program 2-3 days a week with increased exercise capacity. Reported lower sodium intake. Reported increased fruit and vegetable intake. Reports medication compliance.;Short term: Daily weights obtained and reported for increase. Utilizing diuretic protocols set by physician.;Long term: Adoption of self-care skills and reduction of barriers for early signs and symptoms recognition and intervention leading to self-care maintenance.       Core Components/Risk Factors/Patient Goals Review:  Goals and Risk Factor Review    Row Name 05/25/17 0947 06/14/17 1359 07/20/17 0706         Core Components/Risk Factors/Patient Goals Review   Personal Goals Review  Weight Management/Obesity;Heart Failure;Tobacco Cessation  Weight Management/Obesity;Heart Failure;Tobacco Cessation  Weight Management/Obesity;Heart Failure;Tobacco Cessation     Review  Just started program, too early to have met any goals  Has attended 4 exercise sessions, was  sick last week, too early to see progression towards goals, heart failure is stable at this point, no  weight loss.  patient making slow progress towards goals in pulmonary rehab. her attendance has been "spotty" related to not feeling well and death of a friend. her heartfailure remains stable as well as her weight. she continues to smoke daily and has not desire to stop. will continue smoking cessation counseling and encourage regular attendance.     Expected Outcomes  see admission goals  see admission goals  see admission goals        Core Components/Risk Factors/Patient Goals at Discharge (Final Review):  Goals and Risk Factor Review - 07/20/17 0706      Core Components/Risk Factors/Patient Goals Review   Personal Goals Review  Weight Management/Obesity;Heart Failure;Tobacco Cessation    Review  patient making slow progress towards goals in pulmonary rehab. her attendance has been "spotty" related to not feeling well and death of a friend. her heartfailure remains stable as well as her weight. she continues to smoke daily and has not desire to stop. will continue smoking cessation counseling and encourage regular attendance.    Expected Outcomes  see admission goals       ITP Comments:   Comments: patient has attended 10 sessions since admission 05/10/17

## 2017-07-22 ENCOUNTER — Encounter (HOSPITAL_COMMUNITY)
Admission: RE | Admit: 2017-07-22 | Discharge: 2017-07-22 | Disposition: A | Discharge status: Home / Self Care | Payer: BC Managed Care – PPO | Source: Ambulatory Visit | Attending: Internal Medicine | Admitting: Internal Medicine

## 2017-07-22 DIAGNOSIS — I5022 Chronic systolic (congestive) heart failure: Secondary | ICD-10-CM | POA: Diagnosis not present

## 2017-07-22 NOTE — Progress Notes (Signed)
Daily Session Note  Patient Details  Name: [name] Bean MRN: [id number] Date of Birth: December 06, 1950 Referring Provider:     Pulmonary Rehab Walk Test from 05/13/2017 in [place]  Referring Provider  Dr. Haroldine Laws      Encounter Date: 07/22/2017  Check In: Session Check In - 07/22/17 1410      Check-In   Location  [facility]    Staff Present  Trish Fountain, RN, BSN;Molly diVincenzo, MS, ACSM RCEP, Exercise Physiologist;Carlette Wilber Oliphant, RN, BSN    Supervising physician immediately available to respond to emergencies  [facility] immediately available    Physician(s)  Dr. Alfredia Ferguson    Medication changes reported      No    Fall or balance concerns reported     No    Tobacco Cessation  No Change    Warm-up and Cool-down  Performed as group-led instruction    Resistance Training Performed  Yes    VAD Patient?  No      Pain Assessment   Currently in Pain?  No/denies    Multiple Pain Sites  No       Capillary Blood Glucose: No results found for this or any previous visit (from the past 24 hour(s)).    Social History   Tobacco Use  Smoking Status Current Every Day Smoker  . Packs/day: 0.25  . Years: 4.00  . Pack years: 1.00  Smokeless Tobacco Never Used  Tobacco Comment   smokes two a day    Goals Met:  Improved SOB with ADL's Using PLB without cueing & demonstrates good technique Exercise tolerated well No report of cardiac concerns or symptoms Strength training completed today  Goals Unmet:  Not Applicable  Comments: Service time is from 1330 to 1530   Dr. Rush Farmer is Medical Director for Pulmonary Rehab at [facility].

## 2017-07-26 ENCOUNTER — Other Ambulatory Visit (HOSPITAL_COMMUNITY): Payer: Self-pay | Admitting: *Deleted

## 2017-07-26 MED ORDER — CARVEDILOL 25 MG PO TABS: 25.0000 mg | ORAL_TABLET | Freq: Two times a day (BID) | ORAL | 6 refills | Status: DC

## 2017-07-27 ENCOUNTER — Encounter (HOSPITAL_COMMUNITY)
Admission: RE | Admit: 2017-07-27 | Discharge: 2017-07-27 | Disposition: A | Discharge status: Home / Self Care | Payer: BC Managed Care – PPO | Source: Ambulatory Visit | Attending: Internal Medicine | Admitting: Internal Medicine

## 2017-07-27 VITALS — Wt 193.6 lb

## 2017-07-27 DIAGNOSIS — I5022 Chronic systolic (congestive) heart failure: Secondary | ICD-10-CM

## 2017-07-27 NOTE — Progress Notes (Signed)
Daily Session Note  Patient Details  Name: [name] Bean MRN: [id number] Date of Birth: 07/14/1950 Referring Provider:     Pulmonary Rehab Walk Test from 05/13/2017 in [facility]  Referring Provider  Dr. Bensimhon      Encounter Date: 07/27/2017  Check In: Session Check In - 07/27/17 1542      Check-In   Staff Present  Molly DiVincenzo, MS, ACSM RCEP, Exercise Physiologist;Portia Payne, RN, BSN; , RN    Supervising physician immediately available to respond to emergencies  [facility] immediately available    Physician(s)  Dr. Sheikh    Medication changes reported      No    Fall or balance concerns reported     No    Warm-up and Cool-down  Performed as group-led instruction    Resistance Training Performed  Yes    VAD Patient?  No      Pain Assessment   Currently in Pain?  No/denies    Multiple Pain Sites  No       Capillary Blood Glucose: No results found for this or any previous visit (from the past 24 hour(s)). POCT Glucose - 07/27/17 1623      POCT Blood Glucose   Pre-Exercise  226 mg/dL    Post-Exercise  160 mg/dL      Exercise Prescription Changes - 07/27/17 1600      Response to Exercise   Blood Pressure (Admit)  122/50    Blood Pressure (Exercise)  150/60    Blood Pressure (Exit)  112/56    Heart Rate (Admit)  94 bpm    Heart Rate (Exercise)  112 bpm    Heart Rate (Exit)  95 bpm    Oxygen Saturation (Admit)  97 %    Oxygen Saturation (Exercise)  97 %    Oxygen Saturation (Exit)  97 %    Rating of Perceived Exertion (Exercise)  13    Perceived Dyspnea (Exercise)  1    Duration  Progress to 45 minutes of aerobic exercise without signs/symptoms of physical distress    Intensity  THRR unchanged      Progression   Progression  Continue to progress workloads to maintain intensity without signs/symptoms of physical distress.      Resistance Training   Training Prescription  Yes    Weight  orange  bands    Reps  10-15    Time  10 Minutes      Recumbant Bike   Level  2    Minutes  17      NuStep   Level  6    SPM  80    Minutes  2.3      Track   Laps  7    Minutes  17       Social History   Tobacco Use  Smoking Status Current Every Day Smoker  . Packs/day: 0.25  . Years: 4.00  . Pack years: 1.00  Smokeless Tobacco Never Used  Tobacco Comment   smokes two a day    Goals Met:  Exercise tolerated well No report of cardiac concerns or symptoms Strength training completed today  Goals Unmet:  Not Applicable  Comments: Service time is from 1330 to 1515    Dr. Wesam G. Yacoub is Medical Director for Pulmonary Rehab at [facility]. 

## 2017-07-29 ENCOUNTER — Encounter (HOSPITAL_COMMUNITY): Payer: BC Managed Care – PPO

## 2017-07-29 ENCOUNTER — Encounter (HOSPITAL_COMMUNITY)
Admission: RE | Admit: 2017-07-29 | Discharge: 2017-07-29 | Disposition: A | Discharge status: Home / Self Care | Payer: BC Managed Care – PPO | Source: Ambulatory Visit | Attending: Internal Medicine | Admitting: Internal Medicine

## 2017-07-29 ENCOUNTER — Telehealth: Payer: Self-pay | Admitting: Family Medicine

## 2017-07-29 ENCOUNTER — Other Ambulatory Visit: Payer: Self-pay | Admitting: Family Medicine

## 2017-07-29 DIAGNOSIS — I5022 Chronic systolic (congestive) heart failure: Secondary | ICD-10-CM

## 2017-07-29 DIAGNOSIS — Z794 Long term (current) use of insulin: Principal | ICD-10-CM

## 2017-07-29 DIAGNOSIS — E1165 Type 2 diabetes mellitus with hyperglycemia: Secondary | ICD-10-CM

## 2017-07-29 MED ORDER — INSULIN ASPART 100 UNIT/ML ~~LOC~~ SOLN: 12.0000 [IU] | Freq: Three times a day (TID) | SUBCUTANEOUS | 0 refills | Status: DC

## 2017-07-29 NOTE — Telephone Encounter (Signed)
Copied from [place] [id number]. Topic: Quick Communication - Rx Refill/Question >> Jul 29, 2017 10:42 AM Clack, Janett Billow D wrote: Medication: insulin aspart (NOVOLOG) 100 UNIT/ML injection [[id number]]  Has the patient contacted their pharmacy? Yes.   (Agent: If no, request that the patient contact the pharmacy for the refill.) Preferred Pharmacy (with phone number or street name): [id number] - [place], [place]. AT [address] [place] [phone] (Phone) [phone] (Fax)     Agent: Please be advised that RX refills may take up to 3 business days. We ask that you follow-up with your pharmacy.

## 2017-07-29 NOTE — Telephone Encounter (Signed)
No ma'am, pulmonary will do sleep studies.

## 2017-07-29 NOTE — Progress Notes (Signed)
I have reviewed a Home Exercise Prescription with [name] Bean . [name] Bean is not currently exercising at home.  The patient was advised to walk 2 days a week for 30 minutes.  Kimmarie and I discussed how to progress their exercise prescription.  The patient stated that their goals were to get to a point where exercise isn't such a struggle, walk up stairs, and be able to get off the floor without assistance.  The patient stated that they understand the exercise prescription.  We reviewed exercise guidelines, target heart rate during exercise, oxygen use, weather, home pulse oximeter, endpoints for exercise, and goals.  Patient is encouraged to come to me with any questions. I will continue to follow up with the patient to assist them with progression and safety.

## 2017-07-29 NOTE — Telephone Encounter (Signed)
Pt last seen I January. She was advise to follow-up in 4 weeks. Pt needs appointment. Called and left a detailed message stating this.

## 2017-07-29 NOTE — Telephone Encounter (Signed)
Does [facility] do sleep studies?

## 2017-07-29 NOTE — Progress Notes (Signed)
Daily Session Note  Patient Details  Name: [name] Bean MRN: [id number] Date of Birth: Jun 29, 1950 Referring Provider:     Pulmonary Rehab Walk Test from 05/13/2017 in [place]  Referring Provider  Dr. Haroldine Laws      Encounter Date: 07/29/2017  Check In: Session Check In - 07/29/17 1559      Check-In   Location  [facility]    Staff Present  Rodney Langton, RN;Molly DiVincenzo, MS, ACSM RCEP, Exercise Physiologist;Portia Rollene Rotunda, RN, BSN;Carlette Wilber Oliphant, RN, BSN    Supervising physician immediately available to respond to emergencies  [facility] immediately available    Physician(s)  Dr. Cruzita Lederer    Medication changes reported      No    Fall or balance concerns reported     No    Tobacco Cessation  No Change    Warm-up and Cool-down  Performed as group-led instruction    Resistance Training Performed  Yes    VAD Patient?  No      Pain Assessment   Currently in Pain?  No/denies    Multiple Pain Sites  No       Capillary Blood Glucose: No results found for this or any previous visit (from the past 24 hour(s)).    Social History   Tobacco Use  Smoking Status Current Every Day Smoker  . Packs/day: 0.25  . Years: 4.00  . Pack years: 1.00  Smokeless Tobacco Never Used  Tobacco Comment   smokes two a day    Goals Met:  Exercise tolerated well No report of cardiac concerns or symptoms Strength training completed today  Goals Unmet:  Not Applicable  Comments: Service time is from 1330 to 1540    Dr. Rush Farmer is Medical Director for Pulmonary Rehab at [facility].

## 2017-07-29 NOTE — Telephone Encounter (Signed)
Copied from [place] [id number]. Topic: Referral - Request >> Jul 29, 2017 10:43 AM Clack, Laban Emperor wrote: Reason for CRM: Pt would like a referral put in at [facility], for a sleep study. [phone]

## 2017-07-29 NOTE — Telephone Encounter (Signed)
Please advise 

## 2017-08-02 ENCOUNTER — Ambulatory Visit: Payer: BC Managed Care – PPO | Admitting: Family Medicine

## 2017-08-03 ENCOUNTER — Encounter (HOSPITAL_COMMUNITY): Payer: BC Managed Care – PPO

## 2017-08-03 ENCOUNTER — Telehealth (HOSPITAL_COMMUNITY): Payer: Self-pay

## 2017-08-03 NOTE — Telephone Encounter (Signed)
Patient called and stated she has two flat tires and may not make it to class today.

## 2017-08-04 NOTE — Telephone Encounter (Signed)
Is pt snoring? Does she have sleep apena?  I need a reason for why she needs a sleep study.

## 2017-08-05 ENCOUNTER — Encounter (HOSPITAL_COMMUNITY)
Admission: RE | Admit: 2017-08-05 | Discharge: 2017-08-05 | Disposition: A | Discharge status: Home / Self Care | Payer: BC Managed Care – PPO | Source: Ambulatory Visit | Attending: Internal Medicine | Admitting: Internal Medicine

## 2017-08-05 DIAGNOSIS — I5022 Chronic systolic (congestive) heart failure: Secondary | ICD-10-CM

## 2017-08-05 LAB — GLUCOSE, CAPILLARY
GLUCOSE-CAPILLARY: 277 mg/dL — AB (ref 65–99)
GLUCOSE-CAPILLARY: 164 mg/dL — AB (ref 65–99)

## 2017-08-05 NOTE — Progress Notes (Addendum)
Daily Session Note  Patient Details  Name: [name] Bean MRN: [id number] Date of Birth: 1950/07/31 Referring Provider:     Pulmonary Rehab Walk Test from 05/13/2017 in [place]  Referring Provider  Dr. Haroldine Laws      Encounter Date: 08/05/2017  Check In: Session Check In - 08/05/17 1243      Check-In   Location  [facility]    Staff Present  Cloyde Reams DiVincenzo, MS, ACSM RCEP, Exercise Physiologist;Eara Burruel Ysidro Evert, RN    Supervising physician immediately available to respond to emergencies  [facility] immediately available    Physician(s)  Dr. Maylene Roes    Medication changes reported      No    Fall or balance concerns reported     No    Tobacco Cessation  No Change    Warm-up and Cool-down  Performed as group-led instruction    Resistance Training Performed  Yes    VAD Patient?  No      Pain Assessment   Currently in Pain?  No/denies    Multiple Pain Sites  No       Capillary Blood Glucose: Results for orders placed or performed during the hospital encounter of 08/05/17 (from the past 24 hour(s))  Glucose, capillary     Status: Abnormal   Collection Time: 08/05/17  2:34 PM  Result Value Ref Range   Glucose-Capillary 164 (H) 65 - 99 mg/dL      Social History   Tobacco Use  Smoking Status Current Every Day Smoker  . Packs/day: 0.25  . Years: 4.00  . Pack years: 1.00  Smokeless Tobacco Never Used  Tobacco Comment   smokes two a day    Goals Met:  Exercise tolerated well No report of cardiac concerns or symptoms Strength training completed today  Goals Unmet:  Not Applicable  Comments: Service time is from 1230 to 1435    Dr. Rush Farmer is Medical Director for Pulmonary Rehab at [facility].

## 2017-08-05 NOTE — Telephone Encounter (Signed)
Left message on voicemail to call office.  

## 2017-08-06 ENCOUNTER — Ambulatory Visit: Payer: BC Managed Care – PPO | Admitting: Family Medicine

## 2017-08-06 ENCOUNTER — Encounter: Payer: Self-pay | Admitting: Family Medicine

## 2017-08-06 VITALS — BP 132/60 | HR 93 | Temp 98.4°F | Wt 196.4 lb

## 2017-08-06 DIAGNOSIS — F419 Anxiety disorder, unspecified: Secondary | ICD-10-CM

## 2017-08-06 DIAGNOSIS — F329 Major depressive disorder, single episode, unspecified: Secondary | ICD-10-CM | POA: Diagnosis not present

## 2017-08-06 DIAGNOSIS — E1165 Type 2 diabetes mellitus with hyperglycemia: Secondary | ICD-10-CM | POA: Diagnosis not present

## 2017-08-06 DIAGNOSIS — Z794 Long term (current) use of insulin: Secondary | ICD-10-CM | POA: Diagnosis not present

## 2017-08-06 DIAGNOSIS — R400 Somnolence: Secondary | ICD-10-CM | POA: Diagnosis not present

## 2017-08-06 MED ORDER — SERTRALINE HCL 25 MG PO TABS: ORAL_TABLET | ORAL | 2 refills | Status: DC

## 2017-08-06 MED ORDER — ESCITALOPRAM OXALATE 10 MG PO TABS
10.0000 mg | ORAL_TABLET | Freq: Every day | ORAL | 0 refills | Status: DC
Start: 2017-08-06 — End: 2017-09-22

## 2017-08-06 MED ORDER — GLUCOSE BLOOD VI STRP: 1.0000 | ORAL_STRIP | Freq: Every day | 12 refills | Status: AC

## 2017-08-06 MED ORDER — BASAGLAR KWIKPEN 100 UNIT/ML ~~LOC~~ SOPN: PEN_INJECTOR | SUBCUTANEOUS | 11 refills | Status: DC

## 2017-08-06 NOTE — Telephone Encounter (Signed)
Pt has OV today

## 2017-08-06 NOTE — Patient Instructions (Addendum)
During today's visit we discussed increasing her Basaglar insulin to 32 units nightly.  You encouraged to try eating regular meals each day.  Continue checking your blood sugar and keeping a record of the readings. We also discussed decreasing her Lexapro to 10 mg daily for the next 2 weeks in order to switch to a different medication.  A prescription for Lexapro 10 mg has been sent to your pharmacy in the event that you are unable to break the 20 mg pills that you have at home and a half.  If you were able to break the pills in half do not worry about picking up the new prescription for Lexapro.  A prescription for Zoloft (sertraline) 25 mg has been sent to your pharmacy.  You should take 1 pill (25 mg) daily x 1 week, then take 2 pills (50 mg) daily.  You should start the Zoloft after you finish taking the Lexapro 25 mg.   [facility].  [place], [place] [address] [email or web address] Phone: [phone]  Diabetes Mellitus and Exercise Exercising regularly is important for your overall health, especially when you have diabetes (diabetes mellitus). Exercising is not only about losing weight. It has many health benefits, such as increasing muscle strength and bone density and reducing body fat and stress. This leads to improved fitness, flexibility, and endurance, all of which result in better overall health. Exercise has additional benefits for people with diabetes, including:  Reducing appetite.  Helping to lower and control blood glucose.  Lowering blood pressure.  Helping to control amounts of fatty substances (lipids) in the blood, such as cholesterol and triglycerides.  Helping the body to respond better to insulin (improving insulin sensitivity).  Reducing how much insulin the body needs.  Decreasing the risk for heart disease by: ? Lowering cholesterol and triglyceride levels. ? Increasing the levels of good cholesterol. ? Lowering blood glucose  levels.  What is my activity plan? Your health care provider or certified diabetes educator can help you make a plan for the type and frequency of exercise (activity plan) that works for you. Make sure that you:  Do at least 150 minutes of moderate-intensity or vigorous-intensity exercise each week. This could be brisk walking, biking, or water aerobics. ? Do stretching and strength exercises, such as yoga or weightlifting, at least 2 times a week. ? Spread out your activity over at least 3 days of the week.  Get some form of physical activity every day. ? Do not go more than 2 days in a row without some kind of physical activity. ? Avoid being inactive for more than 90 minutes at a time. Take frequent breaks to walk or stretch.  Choose a type of exercise or activity that you enjoy, and set realistic goals.  Start slowly, and gradually increase the intensity of your exercise over time.  What do I need to know about managing my diabetes?  Check your blood glucose before and after exercising. ? If your blood glucose is higher than 240 mg/dL (13.3 mmol/L) before you exercise, check your urine for ketones. If you have ketones in your urine, do not exercise until your blood glucose returns to normal.  Know the symptoms of low blood glucose (hypoglycemia) and how to treat it. Your risk for hypoglycemia increases during and after exercise. Common symptoms of hypoglycemia can include: ? Hunger. ? Anxiety. ? Sweating and feeling clammy. ? Confusion. ? Dizziness or feeling light-headed. ? Increased heart rate or palpitations. ?  Blurry vision. ? Tingling or numbness around the mouth, lips, or tongue. ? Tremors or shakes. ? Irritability.  Keep a rapid-acting carbohydrate snack available before, during, and after exercise to help prevent or treat hypoglycemia.  Avoid injecting insulin into areas of the body that are going to be exercised. For example, avoid injecting insulin into: ? The arms,  when playing tennis. ? The legs, when jogging.  Keep records of your exercise habits. Doing this can help you and your health care provider adjust your diabetes management plan as needed. Write down: ? Food that you eat before and after you exercise. ? Blood glucose levels before and after you exercise. ? The type and amount of exercise you have done. ? When your insulin is expected to peak, if you use insulin. Avoid exercising at times when your insulin is peaking.  When you start a new exercise or activity, work with your health care provider to make sure the activity is safe for you, and to adjust your insulin, medicines, or food intake as needed.  Drink plenty of water while you exercise to prevent dehydration or heat stroke. Drink enough fluid to keep your urine clear or pale yellow. This information is not intended to replace advice given to you by your health care provider. Make sure you discuss any questions you have with your health care provider. Document Released: 06/20/2003 Document Revised: 10/18/2015 Document Reviewed: 09/09/2015 Elsevier Interactive Patient Education  2018 [place] With Depression Everyone experiences occasional disappointment, sadness, and loss in their lives. When you are feeling down, blue, or sad for at least 2 weeks in a row, it may mean that you have depression. Depression can affect your thoughts and feelings, relationships, daily activities, and physical health. It is caused by changes in the way your brain functions. If you receive a diagnosis of depression, your health care provider will tell you which type of depression you have and what treatment options are available to you. If you are living with depression, there are ways to help you recover from it and also ways to prevent it from coming back. How to cope with lifestyle changes Coping with stress Stress is your body's reaction to life changes and events, both good and bad. Stressful  situations may include:  Getting married.  The death of a spouse.  Losing a job.  Retiring.  Having a baby.  Stress can last just a few hours or it can be ongoing. Stress can play a major role in depression, so it is important to learn both how to cope with stress and how to think about it differently. Talk with your health care provider or a counselor if you would like to learn more about stress reduction. He or she may suggest some stress reduction techniques, such as:  Music therapy. This can include creating music or listening to music. Choose music that you enjoy and that inspires you.  Mindfulness-based meditation. This kind of meditation can be done while sitting or walking. It involves being aware of your normal breaths, rather than trying to control your breathing.  Centering prayer. This is a kind of meditation that involves focusing on a spiritual word or phrase. Choose a word, phrase, or sacred image that is meaningful to you and that brings you peace.  Deep breathing. To do this, expand your stomach and inhale slowly through your nose. Hold your breath for 3-5 seconds, then exhale slowly, allowing your stomach muscles to relax.  Muscle relaxation. This  involves intentionally tensing muscles then relaxing them.  Choose a stress reduction technique that fits your lifestyle and personality. Stress reduction techniques take time and practice to develop. Set aside 5-15 minutes a day to do them. Therapists can offer training in these techniques. The training may be covered by some insurance plans. Other things you can do to manage stress include:  Keeping a stress diary. This can help you learn what triggers your stress and ways to control your response.  Understanding what your limits are and saying no to requests or events that lead to a schedule that is too full.  Thinking about how you respond to certain situations. You may not be able to control everything, but you can  control how you react.  Adding humor to your life by watching funny films or TV shows.  Making time for activities that help you relax and not feeling guilty about spending your time this way.  Medicines Your health care provider may suggest certain medicines if he or she feels that they will help improve your condition. Avoid using alcohol and other substances that may prevent your medicines from working properly (may interact). It is also important to:  Talk with your pharmacist or health care provider about all the medicines that you take, their possible side effects, and what medicines are safe to take together.  Make it your goal to take part in all treatment decisions (shared decision-making). This includes giving input on the side effects of medicines. It is best if shared decision-making with your health care provider is part of your total treatment plan.  If your health care provider prescribes a medicine, you may not notice the full benefits of it for 4-8 weeks. Most people who are treated for depression need to be on medicine for at least 6-12 months after they feel better. If you are taking medicines as part of your treatment, do not stop taking medicines without first talking to your health care provider. You may need to have the medicine slowly decreased (tapered) over time to decrease the risk of harmful side effects. Relationships Your health care provider may suggest family therapy along with individual therapy and drug therapy. While there may not be family problems that are causing you to feel depressed, it is still important to make sure your family learns as much as they can about your mental health. Having your family's support can help make your treatment successful. How to recognize changes in your condition Everyone has a different response to treatment for depression. Recovery from major depression happens when you have not had signs of major depression for two months. This  may mean that you will start to:  Have more interest in doing activities.  Feel less hopeless than you did 2 months ago.  Have more energy.  Overeat less often, or have better or improving appetite.  Have better concentration.  Your health care provider will work with you to decide the next steps in your recovery. It is also important to recognize when your condition is getting worse. Watch for these signs:  Having fatigue or low energy.  Eating too much or too little.  Sleeping too much or too little.  Feeling restless, agitated, or hopeless.  Having trouble concentrating or making decisions.  Having unexplained physical complaints.  Feeling irritable, angry, or aggressive.  Get help as soon as you or your family members notice these symptoms coming back. How to get support and help from others How to talk with  friends and family members about your condition Talking to friends and family members about your condition can provide you with one way to get support and guidance. Reach out to trusted friends or family members, explain your symptoms to them, and let them know that you are working with a health care provider to treat your depression. Financial resources Not all insurance plans cover mental health care, so it is important to check with your insurance carrier. If paying for co-pays or counseling services is a problem, search for a local or county mental health care center. They may be able to offer public mental health care services at low or no cost when you are not able to see a private health care provider. If you are taking medicine for depression, you may be able to get the generic form, which may be less expensive. Some makers of prescription medicines also offer help to patients who cannot afford the medicines they need. Follow these instructions at home:  Get the right amount and quality of sleep.  Cut down on using caffeine, tobacco, alcohol, and other  potentially harmful substances.  Try to exercise, such as walking or lifting small weights.  Take over-the-counter and prescription medicines only as told by your health care provider.  Eat a healthy diet that includes plenty of vegetables, fruits, whole grains, low-fat dairy products, and lean protein. Do not eat a lot of foods that are high in solid fats, added sugars, or salt.  Keep all follow-up visits as told by your health care provider. This is important. Contact a health care provider if:  You stop taking your antidepressant medicines, and you have any of these symptoms: ? Nausea. ? Headache. ? Feeling lightheaded. ? Chills and body aches. ? Not being able to sleep (insomnia).  You or your friends and family think your depression is getting worse. Get help right away if:  You have thoughts of hurting yourself or others. If you ever feel like you may hurt yourself or others, or have thoughts about taking your own life, get help right away. You can go to your nearest emergency department or call:  Your local emergency services (911 in the [place]  A suicide crisis helpline, such as the [place] at 438-834-7052. This is open 24-hours a day.  Summary  If you are living with depression, there are ways to help you recover from it and also ways to prevent it from coming back.  Work with your health care team to create a management plan that includes counseling, stress management techniques, and healthy lifestyle habits. This information is not intended to replace advice given to you by your health care provider. Make sure you discuss any questions you have with your health care provider. Document Released: 03/02/2016 Document Revised: 03/02/2016 Document Reviewed: 03/02/2016 Elsevier Interactive Patient Education  [employer].

## 2017-08-06 NOTE — Progress Notes (Signed)
Subjective:    Patient ID: [name] Bean, female    DOB: 07-Aug-1950, 67 y.o.   MRN: [id number]  Chief Complaint  Patient presents with  . Diabetes    HPI Patient was seen today for follow-up on diabetes and depression.  Pt endorses she has been taking Basaglar 2830 units at night and NovoLog 12 units once or twice a day she does not eat regular meals.  Pt endorses eating more at night.  Pt endorses snacking eating a bag of cheese puffs.  Pt endorses decreased mood, energy, increased sleep.  Endorses not feeling herself over the last few months.  Initially pt cannot note any changes, but then realizes 1 of her best friends recently died.  Pt endorses being on Lexapro 20 mg for several years and wonders if it time to change her medication.  Patient denies SI/HI  Patient also requesting referral for sleep study.  Pt states she went to an appointment elsewhere and was told just by looking at her she needed to have a sleep study.  Patient does endorse daytime sleepiness.  Unsure if pt snores at night.  Past Medical History:  Diagnosis Date  . Anemia   . Anxiety   . Arthritis   . Asthma   . Broken arm    left arm  . Bronchitis   . Bursitis of left hip   . CAD (coronary artery disease)   . Candidiasis, vagina   . Cardiac arrest ([place]) 06/20/2016   at [facility]; ? due to flash pulm edema vs undertreated chronic CHF  . Cerumen impaction   . CHF (congestive heart failure) ([place])    RECENT ADMIT TO [facility]   . Colon, diverticulosis   . Depression   . Diabetes mellitus    15 YRS AGO  . Fatigue   . Gastroenteritis   . Hot flashes, menopausal   . Hyperlipidemia   . Knee pain, left   . Lumbar back pain   . Maxillary sinusitis    history  . Muscle tear    right gluteus  . Nausea   . Other B-complex deficiencies   . Otitis media, acute    left  . Peripheral neuropathy   . Spinal stenosis of lumbar region   . Tubulovillous adenoma of colon   . Unspecified essential hypertension      Allergies  Allergen Reactions  . Sitagliptin Other (See Comments)    Had pancreatitis while on Januvia     ROS General: Denies fever, chills, night sweats, changes in weight, changes in appetite  +tiredness HEENT: Denies headaches, ear pain, changes in vision, rhinorrhea, sore throat CV: Denies CP, palpitations, SOB, orthopnea Pulm: Denies SOB, cough, wheezing GI: Denies abdominal pain, nausea, vomiting, diarrhea, constipation GU: Denies dysuria, hematuria, frequency, vaginal discharge Msk: Denies muscle cramps, joint pains Neuro: Denies weakness, numbness, tingling Skin: Denies rashes, bruising Psych: Denies hallucinations  + depression and anxiety     Objective:    Blood pressure 132/60, pulse 93, temperature 98.4 F (36.9 C), temperature source Oral, weight 196 lb 6.4 oz (89.1 kg), SpO2 96 %.   Gen. Pleasant, well-nourished, in no distress, normal affect   HEENT: [place]/AT, face symmetric, no scleral icterus, PERRLA,nares patent without drainage Lungs: no accessory muscle use, CTAB, no wheezes or rales Cardiovascular: RRR, no peripheral edema Neuro:  A&Ox3, CN II-XII intact, normal gait Psych: Tearful at times, mood liable, normal affect   Wt Readings from Last 3 Encounters:  08/06/17 196 lb 6.4 oz (89.1  kg)  07/27/17 193 lb 9 oz (87.8 kg)  07/13/17 195 lb 15.8 oz (88.9 kg)    Lab Results  Component Value Date   WBC 6.2 10/19/2016   HGB 11.8 (L) 10/19/2016   HCT 35.8 (L) 10/19/2016   PLT 218 10/19/2016   GLUCOSE 257 (H) 06/24/2017   CHOL 190 01/02/2011   TRIG 167.0 (H) 01/02/2011   HDL 52.30 01/02/2011   LDLCALC 104 (H) 01/02/2011   ALT 11 (L) 09/09/2016   AST 19 09/09/2016   NA 137 06/24/2017   K 3.6 06/24/2017   CL 101 06/24/2017   CREATININE 0.69 06/24/2017   BUN 7 06/24/2017   CO2 24 06/24/2017   TSH 0.981 10/28/2010   INR 1.0 RATIO 10/27/2007   HGBA1C 9.1 04/15/2017   MICROALBUR 0.67 01/02/2011    Assessment/Plan:  Anxiety and  depression -Gad 7 score 8 -PHQ 9 score 14 -Pt encouraged to start counseling.  Patient given local counseling services -We will decrease Lexapro to 10 mg for 2 weeks.  Patient plan to start Zoloft 25 mg daily x1 week.  Patient will then increase to Zoloft 50 mg daily. -Patient is unsure if she will be able to break Lexapro 20 mg tabs in half so a 10 mg prescription for 2 weeks worth of medication was sent to her pharmacy in the event she is unable to break the medication.  - Plan: escitalopram (LEXAPRO) 10 MG tablet, sertraline (ZOLOFT) 25 MG tablet  Daytime somnolence  - Plan: Ambulatory referral to Pulmonology  Type 2 diabetes mellitus with hyperglycemia, with long-term current use of insulin (HCC)  -We will plan to increase Basaglar to 32 units nightly -Patient to continue taking NovoLog 12 units with meals -Patient encouraged to continue checking FS BS daily and keep a log to bring with current to the clinic. -Patient encouraged to eat regular meals - Plan: Insulin Glargine (BASAGLAR KWIKPEN) 100 UNIT/ML SOPN -Patient declines hemoglobin A1c and FBS today as she knows she has been eating poorly.  Plan to follow-up in clinic in the next 4 to 6 weeks.  Plan to check hemoglobin A1c and FBS at this time.  Follow-up sooner if needed  Grier Mitts, MD

## 2017-08-10 ENCOUNTER — Telehealth (HOSPITAL_COMMUNITY): Payer: Self-pay | Admitting: Family Medicine

## 2017-08-10 ENCOUNTER — Encounter (HOSPITAL_COMMUNITY): Payer: BC Managed Care – PPO

## 2017-08-10 ENCOUNTER — Encounter (HOSPITAL_COMMUNITY): Payer: Self-pay

## 2017-08-12 ENCOUNTER — Encounter (HOSPITAL_COMMUNITY): Payer: BC Managed Care – PPO

## 2017-08-12 ENCOUNTER — Telehealth (HOSPITAL_COMMUNITY): Payer: Self-pay | Admitting: Family Medicine

## 2017-08-17 ENCOUNTER — Encounter (HOSPITAL_COMMUNITY)
Admission: RE | Admit: 2017-08-17 | Discharge: 2017-08-17 | Disposition: A | Discharge status: Home / Self Care | Payer: BC Managed Care – PPO | Source: Ambulatory Visit | Attending: Internal Medicine | Admitting: Internal Medicine

## 2017-08-17 DIAGNOSIS — I5022 Chronic systolic (congestive) heart failure: Secondary | ICD-10-CM | POA: Diagnosis not present

## 2017-08-17 NOTE — Progress Notes (Signed)
Daily Session Note  Patient Details  Name: [name] Bean MRN: [id number] Date of Birth: 09/02/1950 Referring Provider:     Pulmonary Rehab Walk Test from 05/13/2017 in [facility]  Referring Provider  Dr. Bensimhon      Encounter Date: 08/17/2017  Check In: Session Check In - 08/17/17 1330      Check-In   Location  [facility]    Staff Present  Molly DiVincenzo, MS, ACSM RCEP, Exercise Physiologist;Lisa Hughes, RN;Carlette Carlton, RN, BSN;Portia Payne, RN, BSN;Olinty Richards, MS, ACSM CEP, Exercise Physiologist    Supervising physician immediately available to respond to emergencies  [facility] immediately available    Physician(s)  Dr. Amin    Medication changes reported      No    Fall or balance concerns reported     No    Tobacco Cessation  No Change    Warm-up and Cool-down  Performed as group-led instruction    Resistance Training Performed  Yes    VAD Patient?  No      Pain Assessment   Currently in Pain?  No/denies    Multiple Pain Sites  No       Capillary Blood Glucose: No results found for this or any previous visit (from the past 24 hour(s)).    Social History   Tobacco Use  Smoking Status Current Every Day Smoker  . Packs/day: 0.25  . Years: 4.00  . Pack years: 1.00  Smokeless Tobacco Never Used  Tobacco Comment   smokes two a day    Goals Met:  Exercise tolerated well No report of cardiac concerns or symptoms Strength training completed today  Goals Unmet:  Not Applicable  Comments: Service time is from 1350 to 1510    Dr. Wesam G. Yacoub is Medical Director for Pulmonary Rehab at [facility]. 

## 2017-08-19 ENCOUNTER — Encounter (HOSPITAL_COMMUNITY)
Admission: RE | Admit: 2017-08-19 | Discharge: 2017-08-19 | Disposition: A | Discharge status: Home / Self Care | Payer: BC Managed Care – PPO | Source: Ambulatory Visit | Attending: Internal Medicine | Admitting: Internal Medicine

## 2017-08-19 DIAGNOSIS — I5022 Chronic systolic (congestive) heart failure: Secondary | ICD-10-CM | POA: Diagnosis not present

## 2017-08-19 NOTE — Progress Notes (Signed)
[name] Bean 67 y.o. female  DOB: 1950-10-23 MRN: [id number]           Nutrition Note 1. Chronic systolic HF (heart failure) ([place])    Meds reviewed. Decadron, Novolog, Basaglar  Labs:  Lab Results  Component Value Date   HGBA1C 9.1 04/15/2017  Note Spoke with pt. There are some ways the pt can make her eating habits healthier. Pt's Rate Your Plate results reviewed with pt. Pt is diabetic. Last A1c indicates blood glucose poorly controlled. Pt checks CBG's once a day. Fasting CBG's reportedly in the 200's mg/dL. Barriers to blood sugar control include pt binging on junk food at night. Ways to overcome barrier discussed. Pt expressed understanding of the information reviewed via feedback method.    Nutrition Diagnosis ? Food-and nutrition-related knowledge deficit related to lack of exposure to information as related to diagnosis of pulmonary disease ? Obesity related to excessive energy intake as evidenced by a BMI of 36.0  Nutrition Intervention ? Pt's individual nutrition plan and goals reviewed with pt. ? Benefits of adopting healthy eating habits discussed when pt's Rate Your Plate reviewed.  Goal(s) 1. Identify food quantities necessary to achieve wt loss of  -2# per week to a goal wt loss of 2.7-10.9 kg (6-24 lb) at graduation from pulmonary rehab. 2. Pt to increase her consumption of whole grains, fruit and vegetables. Pt to aim for at least 1 serving of fruit or vegetable at all meals.  3. Pt to have a calming ritual (e.g. Hot tea) when she wants to tell herself "I'm done with eating for the day." 4. Improved glycemic control as evidenced by pt's A1c trending from 9.1 to less than 7  Plan:  Pt to attend Pulmonary Nutrition class Will provide client-centered nutrition education as part of interdisciplinary care.   Monitor and evaluate progress toward nutrition goal with team.  Monitor and Evaluate progress toward nutrition goal with team.   Derek Mound, M.Ed, RD,  LDN, CDE 08/19/2017 3:32 PM

## 2017-08-19 NOTE — Progress Notes (Addendum)
Daily Session Note  Patient Details  Name: [name] Bean MRN: [id number] Date of Birth: 08-06-1950 Referring Provider:     Pulmonary Rehab Walk Test from 05/13/2017 in [place]  Referring Provider  Dr. Haroldine Laws      Encounter Date: 08/19/2017  Check In: Session Check In - 08/19/17 1401      Check-In   Location  [facility]    Staff Present  Rodney Langton, RN;Molly DiVincenzo, MS, ACSM RCEP, Exercise Physiologist;Portia Rollene Rotunda, RN, BSN    Supervising physician immediately available to respond to emergencies  [facility] immediately available    Physician(s)  Dr. Stevphen Meuse    Medication changes reported      No    Fall or balance concerns reported     No    Tobacco Cessation  No Change    Warm-up and Cool-down  Performed as group-led instruction    Resistance Training Performed  Yes    VAD Patient?  No      Pain Assessment   Currently in Pain?  No/denies    Multiple Pain Sites  No       Capillary Blood Glucose: No results found for this or any previous visit (from the past 24 hour(s)).    Social History   Tobacco Use  Smoking Status Current Every Day Smoker  . Packs/day: 0.25  . Years: 4.00  . Pack years: 1.00  Smokeless Tobacco Never Used  Tobacco Comment   smokes two a day    Goals Met:  Exercise tolerated well No report of cardiac concerns or symptoms Strength training completed today  Goals Unmet:  Not Applicable  Comments: Service time is from 1330 to 1545    Dr. Rush Farmer is Medical Director for Pulmonary Rehab at [facility].

## 2017-08-19 NOTE — Progress Notes (Signed)
Pulmonary Individual Treatment Plan  Patient Details  Name: [name] Bean MRN: [id number] Date of Birth: 1950/05/12 Referring Provider:     Pulmonary Rehab Walk Bean from 05/13/2017 in [place]  Referring Provider  Dr. Haroldine Laws      Initial Encounter Date:    Pulmonary Rehab Walk Bean from 05/13/2017 in [facility]  Date  05/17/17  Referring Provider  Dr. Haroldine Laws      Visit Diagnosis: Chronic systolic HF (heart failure) ([place])  Patient's Home Medications on Admission:   Current Outpatient Medications:  .  aspirin EC 81 MG tablet, Take 81 mg by mouth daily. , Disp: , Rfl:  .  BD PEN NEEDLE NANO U/F 32G X 4 MM MISC, USE TO INJECT INSULIN 5 TIMES A DAY, Disp: 100 each, Rfl: 5 .  bimatoprost (LUMIGAN) 0.01 % SOLN, Lumigan 0.01 % eye drops  PUT 1 DROP INTO BOTH EYES AT BEDTIME, Disp: , Rfl:  .  Blood Glucose Monitoring Suppl (ACCU-CHEK COMPACT CARE KIT) KIT, Accu-Chek Compact Plus Care kit  USE AS INSTRUCTED., Disp: , Rfl:  .  carvedilol (COREG) 25 MG tablet, Take 1 tablet (25 mg total) by mouth 2 (two) times daily with a meal., Disp: 180 tablet, Rfl: 6 .  cyclobenzaprine (FLEXERIL) 5 MG tablet, cyclobenzaprine 5 mg tablet  TAKE 1 TABLET (5 MG TOTAL) BY MOUTH 3 (THREE) TIMES DAILY AS NEEDED FOR MUSCLE SPASMS., Disp: , Rfl:  .  escitalopram (LEXAPRO) 10 MG tablet, Take 1 tablet (10 mg total) by mouth daily., Disp: 14 tablet, Rfl: 0 .  glucose blood (FREESTYLE Bean STRIPS) Bean strip, 1 each by Other route daily. (accu-chek guide) DX E11.9, Disp: 200 each, Rfl: 12 .  hydrALAZINE (APRESOLINE) 50 MG tablet, Take 50-100 mg by mouth See admin instructions. Take 100 mg by mouth in the morning and take 50 mg by mouth in the evening, Disp: , Rfl:  .  insulin aspart (NOVOLOG) 100 UNIT/ML injection, Inject 12 Units into the skin 3 (three) times daily before meals. Needs office visit for further refills., Disp: 10 mL, Rfl: 0 .   Insulin Glargine (BASAGLAR KWIKPEN) 100 UNIT/ML SOPN, INJECT 32 UNITS SUBCUTANEOUSLY NIGHTLY, Disp: 15 mL, Rfl: 11 .  latanoprost (XALATAN) 0.005 % ophthalmic solution, Place 1 drop into both eyes at bedtime., Disp: , Rfl:  .  losartan (COZAAR) 100 MG tablet, Take 100 mg by mouth daily., Disp: , Rfl:  .  Melatonin 3 MG TABS, Take 3 mg by mouth at bedtime as needed (sleep)., Disp: , Rfl:  .  rosuvastatin (CRESTOR) 20 MG tablet, Take 20 mg by mouth daily., Disp: , Rfl:  .  sertraline (ZOLOFT) 25 MG tablet, Take 1 tab (25 mg) for 1 week.  Then start taking 2 tabs (50 mg) daily., Disp: 60 tablet, Rfl: 2 .  spironolactone (ALDACTONE) 25 MG tablet, Take 25 mg by mouth daily., Disp: , Rfl:  .  torsemide (DEMADEX) 20 MG tablet, Take 20-40 mg by mouth See admin instructions. Take 20 mg by mouth daily and may take 20-40 mg by mouth as needed for swelling, Disp: , Rfl:  .  varenicline (CHANTIX CONTINUING MONTH PAK) 1 MG tablet, Take 1 tablet (1 mg total) by mouth 2 (two) times daily., Disp: 60 tablet, Rfl: 6  Past Medical History: Past Medical History:  Diagnosis Date  . Anemia   . Anxiety   . Arthritis   . Asthma   . Broken arm  left arm  . Bronchitis   . Bursitis of left hip   . CAD (coronary artery disease)   . Candidiasis, vagina   . Cardiac arrest ([place]) 06/20/2016   at [facility]; ? due to flash pulm edema vs undertreated chronic CHF  . Cerumen impaction   . CHF (congestive heart failure) ([place])    RECENT ADMIT TO [facility]   . Colon, diverticulosis   . Depression   . Diabetes mellitus    15 YRS AGO  . Fatigue   . Gastroenteritis   . Hot flashes, menopausal   . Hyperlipidemia   . Knee pain, left   . Lumbar back pain   . Maxillary sinusitis    history  . Muscle tear    right gluteus  . Nausea   . Other B-complex deficiencies   . Otitis media, acute    left  . Peripheral neuropathy   . Spinal stenosis of lumbar region   . Tubulovillous adenoma of colon   . Unspecified essential  hypertension     Tobacco Use: Social History   Tobacco Use  Smoking Status Current Every Day Smoker  . Packs/day: 0.25  . Years: 4.00  . Pack years: 1.00  Smokeless Tobacco Never Used  Tobacco Comment   smokes two a day    Labs: Recent Review Flowsheet Data    Labs for ITP Cardiac and Pulmonary Rehab Latest Ref Rng & Units 10/28/2010 01/02/2011 09/09/2016 10/19/2016 04/15/2017   Cholestrol 0 - 200 mg/dL - 190 - - -   LDLCALC 0 - 99 mg/dL - 104(H) - - -   HDL >39.00 mg/dL - 52.30 - - -   Trlycerides 0.0 - 149.0 mg/dL - 167.0(H) - - -   Hemoglobin A1c - 8.4(H) 8.1(H) 8.1(H) 7.6(H) 9.1      Capillary Blood Glucose: Lab Results  Component Value Date   GLUCAP 164 (H) 08/05/2017   GLUCAP 277 (H) 08/05/2017   GLUCAP 149 (H) 06/03/2017   GLUCAP 91 05/20/2017   GLUCAP 79 05/20/2017   POCT Glucose    Row Name 07/01/17 1414 07/13/17 1559 07/27/17 1623         POCT Blood Glucose   Pre-Exercise  209 mg/dL  240 mg/dL  226 mg/dL     Post-Exercise  166 mg/dL  220 mg/dL  160 mg/dL        Pulmonary Assessment Scores: Pulmonary Assessment Scores    Row Name 05/17/17 0715         ADL UCSD   ADL Phase  Entry       mMRC Score   mMRC Score  0        Pulmonary Function Assessment: Pulmonary Function Assessment - 05/10/17 1036      Breath   Bilateral Breath Sounds  Clear    Shortness of Breath  Yes on occasion       Exercise Target Goals:    Exercise Program Goal: Individual exercise prescription set using results from initial 6 min walk Bean and THRR while considering  patient's activity barriers and safety.    Exercise Prescription Goal: Initial exercise prescription builds to 30-45 minutes a day of aerobic activity, 2-3 days per week.  Home exercise guidelines will be given to patient during program as part of exercise prescription that the participant will acknowledge.  Activity Barriers & Risk Stratification: Activity Barriers & Cardiac Risk Stratification -  05/10/17 1021      Activity Barriers & Cardiac Risk Stratification   Activity Barriers  Neck/Spine Problems;Back Problems;Deconditioning;Assistive Device;History of Falls;Balance Concerns       6 Minute Walk: 6 Minute Walk    Row Name 05/17/17 0708         6 Minute Walk   Phase  Initial     Distance  710 feet     Walk Time  6 minutes     # of Rest Breaks  0     MPH  1.34     METS  2     RPE  13     Perceived Dyspnea   0     Symptoms  Yes (comment)     Comments  used wheelchair, calf and hip pain-patient stated from stiffness     Resting HR  84 bpm     Resting BP  140/77     Resting Oxygen Saturation   97 %     Exercise Oxygen Saturation  during 6 min walk  97 %     Max Ex. HR  118 bpm     Max Ex. BP  143/76       Interval HR   1 Minute HR  94     2 Minute HR  86     3 Minute HR  104     4 Minute HR  110     5 Minute HR  118     6 Minute HR  101     2 Minute Post HR  95     Interval Heart Rate?  Yes       Interval Oxygen   Interval Oxygen?  Yes     Baseline Oxygen Saturation %  97 %     1 Minute Oxygen Saturation %  98 %     1 Minute Liters of Oxygen  0 L     2 Minute Oxygen Saturation %  98 %     2 Minute Liters of Oxygen  0 L     3 Minute Oxygen Saturation %  97 %     3 Minute Liters of Oxygen  0 L     4 Minute Oxygen Saturation %  98 %     4 Minute Liters of Oxygen  0 L     5 Minute Oxygen Saturation %  98 %     5 Minute Liters of Oxygen  0 L     6 Minute Oxygen Saturation %  99 %     6 Minute Liters of Oxygen  0 L     2 Minute Post Oxygen Saturation %  97 %     2 Minute Post Liters of Oxygen  0 L        Oxygen Initial Assessment: Oxygen Initial Assessment - 05/17/17 0708      Initial 6 min Walk   Oxygen Used  None      Program Oxygen Prescription   Program Oxygen Prescription  None       Oxygen Re-Evaluation: Oxygen Re-Evaluation    Row Name 05/21/17 1013 06/21/17 0841 07/16/17 1522 08/17/17 0740       Program Oxygen Prescription    Program Oxygen Prescription  None  None  None  None      Home Oxygen   Home Oxygen Device  None  None  None  None    Sleep Oxygen Prescription  None  None  None  None    Home Exercise Oxygen Prescription  None  None  None  None  Home at Rest Exercise Oxygen Prescription  None  None  None  None       Oxygen Discharge (Final Oxygen Re-Evaluation): Oxygen Re-Evaluation - 08/17/17 0740      Program Oxygen Prescription   Program Oxygen Prescription  None      Home Oxygen   Home Oxygen Device  None    Sleep Oxygen Prescription  None    Home Exercise Oxygen Prescription  None    Home at Rest Exercise Oxygen Prescription  None       Initial Exercise Prescription: Initial Exercise Prescription - 05/17/17 0700      Date of Initial Exercise RX and Referring Provider   Date  05/17/17    Referring Provider  Dr. Haroldine Laws      Recumbant Bike   Level  2    Watts  20    Minutes  17      NuStep   Level  2    SPM  80    Minutes  17      Track   Laps  5    Minutes  17      Prescription Details   Frequency (times per week)  2    Duration  Progress to 45 minutes of aerobic exercise without signs/symptoms of physical distress      Intensity   THRR 40-80% of Max Heartrate  62-125    Ratings of Perceived Exertion  11-13    Perceived Dyspnea  0-4      Progression   Progression  Continue progressive overload as per policy without signs/symptoms or physical distress.      Resistance Training   Training Prescription  Yes    Weight  orange bands    Reps  10-15       Perform Capillary Blood Glucose checks as needed.  Exercise Prescription Changes: Exercise Prescription Changes    Row Name 06/01/17 1500 06/15/17 1500 06/24/17 1412 07/13/17 1500 07/27/17 1600     Response to Exercise   Blood Pressure (Admit)  130/50  116/50  106/46  130/60  122/50   Blood Pressure (Exercise)  150/70  138/58  120/60  140/64  150/60   Blood Pressure (Exit)  102/62  114/64  96/50  122/88   112/56   Heart Rate (Admit)  93 bpm  89 bpm  75 bpm  79 bpm  94 bpm   Heart Rate (Exercise)  111 bpm  100 bpm  86 bpm  112 bpm  112 bpm   Heart Rate (Exit)  92 bpm  80 bpm  76 bpm  99 bpm  95 bpm   Oxygen Saturation (Admit)  99 %  96 %  97 %  100 %  97 %   Oxygen Saturation (Exercise)  96 %  96 %  98 %  97 %  97 %   Oxygen Saturation (Exit)  97 %  97 %  98 %  99 %  97 %   Rating of Perceived Exertion (Exercise)  '11  13  13  13  13   '$ Perceived Dyspnea (Exercise)  1  0  0  1  1   Duration  Progress to 45 minutes of aerobic exercise without signs/symptoms of physical distress  Progress to 45 minutes of aerobic exercise without signs/symptoms of physical distress  Progress to 45 minutes of aerobic exercise without signs/symptoms of physical distress  Progress to 45 minutes of aerobic exercise without signs/symptoms of physical distress  Progress to  45 minutes of aerobic exercise without signs/symptoms of physical distress   Intensity  Other (comment) 40-80% HRR  Other (comment) 40-80% HRR  Other (comment) 40-80% HRR  THRR unchanged  THRR unchanged     Progression   Progression  Continue to progress workloads to maintain intensity without signs/symptoms of physical distress.  Continue to progress workloads to maintain intensity without signs/symptoms of physical distress.  Continue to progress workloads to maintain intensity without signs/symptoms of physical distress.  Continue to progress workloads to maintain intensity without signs/symptoms of physical distress.  Continue to progress workloads to maintain intensity without signs/symptoms of physical distress.     Resistance Training   Training Prescription  Yes  Yes  Yes  Yes  Yes   Weight  orange bands  orange bands  orange bands  orange bands  orange bands   Reps  10-15  10-15  10-15  10-15  10-15   Time  -  -  -  10 Minutes  10 Minutes     Recumbant Bike   Level  -  -  -  2  2   Minutes  -  -  -  34  17     NuStep   Level  '4  4  5  '$ -  6    SPM  80  80  80  -  80   Minutes  '17  17  17  '$ -  2.3     Arm Ergometer   Level  1  1  -  -  -   Minutes  17  17  -  -  -     Track   Laps  '8  6  4  6  7   '$ Minutes  -  '17  17  17  17   '$ Row Name 07/29/17 1600 08/05/17 1528           Response to Exercise   Blood Pressure (Admit)  -  110/58      Blood Pressure (Exercise)  -  120/88      Blood Pressure (Exit)  -  132/70      Heart Rate (Admit)  -  97 bpm      Heart Rate (Exercise)  -  109 bpm      Heart Rate (Exit)  -  114 bpm      Oxygen Saturation (Admit)  -  98 %      Oxygen Saturation (Exercise)  -  97 %      Oxygen Saturation (Exit)  -  97 %      Rating of Perceived Exertion (Exercise)  -  9      Perceived Dyspnea (Exercise)  -  0      Duration  -  Progress to 45 minutes of aerobic exercise without signs/symptoms of physical distress      Intensity  -  THRR unchanged        Progression   Progression  -  Continue to progress workloads to maintain intensity without signs/symptoms of physical distress.        Resistance Training   Training Prescription  -  Yes      Weight  -  orange bands      Reps  -  10-15      Time  -  10 Minutes        Recumbant Bike   Level  -  2      Minutes  -  [place] to continue exercise at  Home (comment)  -      Frequency  Add 2 additional days to program exercise sessions.  -         Exercise Comments: Exercise Comments    Row Name 07/29/17 1636           Exercise Comments  Home exercise completed          Exercise Goals and Review: Exercise Goals    Row Name 05/10/17 1022             Exercise Goals   Increase Physical Activity  Yes       Intervention  Provide advice, education, support and counseling about physical activity/exercise needs.;Develop an individualized exercise prescription for aerobic and resistive training based on initial evaluation findings, risk stratification,  comorbidities and participant's personal goals.       Expected Outcomes  Short Term: Attend rehab on a regular basis to increase amount of physical activity.;Long Term: Add in home exercise to make exercise part of routine and to increase amount of physical activity.;Long Term: Exercising regularly at least 3-5 days a week.       Increase Strength and Stamina  Yes       Intervention  Provide advice, education, support and counseling about physical activity/exercise needs.;Develop an individualized exercise prescription for aerobic and resistive training based on initial evaluation findings, risk stratification, comorbidities and participant's personal goals.       Expected Outcomes  Short Term: Increase workloads from initial exercise prescription for resistance, speed, and METs.       Able to understand and use rate of perceived exertion (RPE) scale  Yes       Intervention  Provide education and explanation on how to use RPE scale       Expected Outcomes  Short Term: Able to use RPE daily in rehab to express subjective intensity level;Long Term:  Able to use RPE to guide intensity level when exercising independently       Able to understand and use Dyspnea scale  Yes       Intervention  Provide education and explanation on how to use Dyspnea scale       Expected Outcomes  Long Term: Able to use Dyspnea scale to guide intensity level when exercising independently;Short Term: Able to use Dyspnea scale daily in rehab to express subjective sense of shortness of breath during exertion       Knowledge and understanding of Target Heart Rate Range (THRR)  Yes       Intervention  Provide education and explanation of THRR including how the numbers were predicted and where they are located for reference       Expected Outcomes  Short Term: Able to state/look up THRR;Short Term: Able to use daily as guideline for intensity in rehab;Long Term: Able to use THRR to govern intensity when exercising independently        Understanding of Exercise Prescription  Yes       Intervention  Provide education, explanation, and written materials on patient's individual exercise prescription       Expected Outcomes  Short Term: Able to explain program exercise prescription;Long Term: Able to explain home exercise prescription to exercise independently  Exercise Goals Re-Evaluation : Exercise Goals Re-Evaluation    Row Name 05/21/17 1013 06/21/17 0841 07/16/17 1522 08/17/17 0740       Exercise Goal Re-Evaluation   Exercise Goals Review  Increase Strength and Stamina;Able to understand and use Dyspnea scale;Increase Physical Activity;Able to understand and use rate of perceived exertion (RPE) scale;Knowledge and understanding of Target Heart Rate Range (THRR);Understanding of Exercise Prescription  Increase Strength and Stamina;Able to understand and use Dyspnea scale;Increase Physical Activity;Able to understand and use rate of perceived exertion (RPE) scale;Knowledge and understanding of Target Heart Rate Range (THRR);Understanding of Exercise Prescription  Increase Strength and Stamina;Able to understand and use Dyspnea scale;Increase Physical Activity;Able to understand and use rate of perceived exertion (RPE) scale;Knowledge and understanding of Target Heart Rate Range (THRR);Understanding of Exercise Prescription  Increase Strength and Stamina;Able to understand and use Dyspnea scale;Increase Physical Activity;Able to understand and use rate of perceived exertion (RPE) scale;Knowledge and understanding of Target Heart Rate Range (THRR);Understanding of Exercise Prescription    Comments  Patient arrived for her first day of exercise but her blood sugar was too low. Will cont. to educate and motivate.  Due to patient's deconditioning, her progress has been slow. She is limited by her chronic pain. Patient has only been able to completed 5-6 laps (200 ft) in 15 minutes. I will cont. to progress patient as tolerated. I  will also go over home exercise and stress its importance.  Due to patient's deconditioning, her progress has been slow. She is limited by her chronic pain. Patient has only been able to completed 5-6 laps (200 ft) in 15 minutes. I will cont. to progress patient as tolerated. I will also go over home exercise and stress its importance.  Due to patient's deconditioning, her progress has been slow. Attendance is poor. She is limited by her chronic pain. Patient has only been able to completed 5-6 laps (200 ft) in 15 minutes. I will cont. to progress patient as tolerated. I will also go over home exercise and stress its importance.    Expected Outcomes  Through exercise at rehab and at home, patient will increase strength and stamina and ADL's will be easier to perform.   Through exercise at rehab, the patient will increase strength and stamina and gain the confidence to establish an exercise regime at home. Through exercise, patient will gain a greater quality of life with ADL's being easier to perform.   Through exercise at rehab, the patient will increase strength and stamina and gain the confidence to establish an exercise regime at home. Through exercise, patient will gain a greater quality of life with ADL's being easier to perform.   Through exercise at rehab, the patient will increase strength and stamina and gain the confidence to establish an exercise regime at home. Through exercise, patient will gain a greater quality of life with ADL's being easier to perform.        Discharge Exercise Prescription (Final Exercise Prescription Changes): Exercise Prescription Changes - 08/05/17 1528      Response to Exercise   Blood Pressure (Admit)  110/58    Blood Pressure (Exercise)  120/88    Blood Pressure (Exit)  132/70    Heart Rate (Admit)  97 bpm    Heart Rate (Exercise)  109 bpm    Heart Rate (Exit)  114 bpm    Oxygen Saturation (Admit)  98 %    Oxygen Saturation (Exercise)  97 %    Oxygen  Saturation (Exit)  97 %    Rating of Perceived Exertion (Exercise)  9    Perceived Dyspnea (Exercise)  0    Duration  Progress to 45 minutes of aerobic exercise without signs/symptoms of physical distress    Intensity  THRR unchanged      Progression   Progression  Continue to progress workloads to maintain intensity without signs/symptoms of physical distress.      Resistance Training   Training Prescription  Yes    Weight  orange bands    Reps  10-15    Time  10 Minutes      Recumbant Bike   Level  2    Minutes  17      Track   Laps  8    Minutes  17       Nutrition:  Target Goals: Understanding of nutrition guidelines, daily intake of sodium '1500mg'$ , cholesterol '200mg'$ , calories 30% from fat and 7% or less from saturated fats, daily to have 5 or more servings of fruits and vegetables.  Biometrics: Pre Biometrics - 05/17/17 0708      Pre Biometrics   Grip Strength  18 kg        Nutrition Therapy Plan and Nutrition Goals: Nutrition Therapy & Goals - 06/04/17 1221      Nutrition Therapy   Diet  Carb Modified, Heart Healthy Low Sodium       Personal Nutrition Goals   Nutrition Goal  Identify food quantities necessary to achieve wt loss of  -2# per week to a goal wt loss of 2.7-10.9 kg (6-24 lb) at graduation from pulmonary rehab.    Personal Goal #2  Describe the benefit of including fruits, vegetables, whole grains, and low-fat dairy products in a healthy meal plan.    Personal Goal #3  Improved glycemic control as evidenced by pt's A1c trending from 9.1 to less than 7      Intervention Plan   Intervention  Prescribe, educate and counsel regarding individualized specific dietary modifications aiming towards targeted core components such as weight, hypertension, lipid management, diabetes, heart failure and other comorbidities.    Expected Outcomes  Short Term Goal: Understand basic principles of dietary content, such as calories, fat, sodium, cholesterol and  nutrients.;Long Term Goal: Adherence to prescribed nutrition plan.       Nutrition Assessments: Nutrition Assessments - 06/04/17 1219      Rate Your Plate Scores   Pre Score  47       Nutrition Goals Re-Evaluation:   Nutrition Goals Discharge (Final Nutrition Goals Re-Evaluation):   Psychosocial: Target Goals: Acknowledge presence or absence of significant depression and/or stress, maximize coping skills, provide positive support system. Participant is able to verbalize types and ability to use techniques and skills needed for reducing stress and depression.  Initial Review & Psychosocial Screening: Initial Psych Review & Screening - 05/10/17 1039      Initial Review   Current issues with  Current Depression;Current Stress Concerns    Source of Stress Concerns  Chronic Illness      Family Dynamics   Good Support System?  Yes    Comments  patient feels her depression is under controll and it will not be a barrier to participation in pulmonary rehab      Barriers   Psychosocial barriers to participate in program  The patient should benefit from training in stress management and relaxation.      Screening Interventions   Interventions  Encouraged to exercise  Quality of Life Scores:  Scores of 19 and below usually indicate a poorer quality of life in these areas.  A difference of  2-3 points is a clinically meaningful difference.  A difference of 2-3 points in the total score of the Quality of Life Index has been associated with significant improvement in overall quality of life, self-image, physical symptoms, and general health in studies assessing change in quality of life.   PHQ-9: Recent Review Flowsheet Data    Depression screen [facility] 2/9 08/06/2017 05/10/2017   Decreased Interest 3 1   Down, Depressed, Hopeless 1 1   PHQ - 2 Score 4 2   Altered sleeping 2 1   Tired, decreased energy 2 1   Change in appetite 2 1   Feeling bad or failure about yourself  3 1    Trouble concentrating 1 0   Moving slowly or fidgety/restless 0 0   Suicidal thoughts 0 0   PHQ-9 Score 14 6   Difficult doing work/chores - Not difficult at all     Interpretation of Total Score  Total Score Depression Severity:  1-4 = Minimal depression, 5-9 = Mild depression, 10-14 = Moderate depression, 15-19 = Moderately severe depression, 20-27 = Severe depression   Psychosocial Evaluation and Intervention:   Psychosocial Re-Evaluation: Psychosocial Re-Evaluation    Row Name 05/25/17 0949 06/14/17 1400 07/20/17 0714 08/16/17 1717       Psychosocial Re-Evaluation   Current issues with  Current Depression;History of Depression;Current Stress Concerns  Current Depression;History of Depression  Current Depression;History of Depression  Current Depression;History of Depression    Comments  just started program, no change in psychosocial issues  just started program, no change in psychosocial issues  patient states depression symptoms are stable  patient states depression symptoms are stable however with her lack of attendance RN questions recurrance of depression symptoms. will address with patient at next encounter.    Expected Outcomes  no barriers to participation in pulmonary rehab  no barriers to participation in pulmonary rehab  no barriers to participation in pulmonary rehab  no barriers to participation in pulmonary rehab    Interventions  Encouraged to attend Pulmonary Rehabilitation for the exercise  Encouraged to attend Pulmonary Rehabilitation for the exercise  Encouraged to attend Pulmonary Rehabilitation for the exercise  Encouraged to attend Pulmonary Rehabilitation for the exercise    Continue Psychosocial Services   Follow up required by staff  Follow up required by staff  Follow up required by staff  Follow up required by staff    Comments  no change just began program  no change just began program  -  -      Initial Review   Source of Stress Concerns  Chronic Illness   Chronic Illness  -  -       Psychosocial Discharge (Final Psychosocial Re-Evaluation): Psychosocial Re-Evaluation - 08/16/17 1717      Psychosocial Re-Evaluation   Current issues with  Current Depression;History of Depression    Comments  patient states depression symptoms are stable however with her lack of attendance RN questions recurrance of depression symptoms. will address with patient at next encounter.    Expected Outcomes  no barriers to participation in pulmonary rehab    Interventions  Encouraged to attend Pulmonary Rehabilitation for the exercise    Continue Psychosocial Services   Follow up required by staff       Education: Education Goals: Education classes will be provided on a weekly basis,  covering required topics. Participant will state understanding/return demonstration of topics presented.  Learning Barriers/Preferences: Learning Barriers/Preferences - 05/10/17 1035      Learning Barriers/Preferences   Learning Barriers  None    Learning Preferences  Written Material;Audio       Education Topics: Risk Factor Reduction:  -Group instruction that is supported by a PowerPoint presentation. Instructor discusses the definition of a risk factor, different risk factors for pulmonary disease, and how the heart and lungs work together.     Nutrition for Pulmonary Patient:  -Group instruction provided by PowerPoint slides, verbal discussion, and written materials to support subject matter. The instructor gives an explanation and review of healthy diet recommendations, which includes a discussion on weight management, recommendations for fruit and vegetable consumption, as well as protein, fluid, caffeine, fiber, sodium, sugar, and alcohol. Tips for eating when patients are short of breath are discussed.   Pursed Lip Breathing:  -Group instruction that is supported by demonstration and informational handouts. Instructor discusses the benefits of pursed lip and  diaphragmatic breathing and detailed demonstration on how to preform both.     Oxygen Safety:  -Group instruction provided by PowerPoint, verbal discussion, and written material to support subject matter. There is an overview of "What is Oxygen" and "Why do we need it".  Instructor also reviews how to create a safe environment for oxygen use, the importance of using oxygen as prescribed, and the risks of noncompliance. There is a brief discussion on traveling with oxygen and resources the patient may utilize.   PULMONARY REHAB OTHER RESPIRATORY from 08/05/2017 in [place]  Date  07/29/17  Educator  RN  Instruction Review Code  2- Demonstrated Understanding      Oxygen Equipment:  -Group instruction provided by [facility] Staff utilizing handouts, written materials, and equipment demonstrations.   PULMONARY REHAB OTHER RESPIRATORY from 08/05/2017 in [place]  Date  05/27/17  Educator  [facility]  Instruction Review Code  1- Verbalizes Understanding      Signs and Symptoms:  -Group instruction provided by written material and verbal discussion to support subject matter. Warning signs and symptoms of infection, stroke, and heart attack are reviewed and when to call the physician/911 reinforced. Tips for preventing the spread of infection discussed.   Advanced Directives:  -Group instruction provided by verbal instruction and written material to support subject matter. Instructor reviews Advanced Directive laws and proper instruction for filling out document.   Pulmonary Video:  -Group video education that reviews the importance of medication and oxygen compliance, exercise, good nutrition, pulmonary hygiene, and pursed lip and diaphragmatic breathing for the pulmonary patient.   Exercise for the Pulmonary Patient:  -Group instruction that is supported by a PowerPoint presentation. Instructor discusses benefits of  exercise, core components of exercise, frequency, duration, and intensity of an exercise routine, importance of utilizing pulse oximetry during exercise, safety while exercising, and options of places to exercise outside of rehab.     PULMONARY REHAB OTHER RESPIRATORY from 08/05/2017 in [place]  Date  06/03/17  Educator  EP  Instruction Review Code  1- Verbalizes Understanding      Pulmonary Medications:  -Verbally interactive group education provided by instructor with focus on inhaled medications and proper administration.   Anatomy and Physiology of the Respiratory System and Intimacy:  -Group instruction provided by PowerPoint, verbal discussion, and written material to support subject matter. Instructor reviews respiratory cycle and anatomical  components of the respiratory system and their functions. Instructor also reviews differences in obstructive and restrictive respiratory diseases with examples of each. Intimacy, Sex, and Sexuality differences are reviewed with a discussion on how relationships can change when diagnosed with pulmonary disease. Common sexual concerns are reviewed.   MD DAY -A group question and answer session with a medical doctor that allows participants to ask questions that relate to their pulmonary disease state.   PULMONARY REHAB OTHER RESPIRATORY from 08/05/2017 in [place]  Date  07/22/17  Educator  [employer]  Instruction Review Code  2- Demonstrated Understanding      OTHER EDUCATION -Group or individual verbal, written, or video instructions that support the educational goals of the pulmonary rehab program.   PULMONARY REHAB OTHER RESPIRATORY from 08/05/2017 in [place]  Date  08/05/17  Educator  Lucianne Lei  Instruction Review Code  2- Demonstrated Understanding      Holiday Eating Survival Tips:  -Group instruction provided by PowerPoint slides,  verbal discussion, and written materials to support subject matter. The instructor gives patients tips, tricks, and techniques to help them not only survive but enjoy the holidays despite the onslaught of food that accompanies the holidays.   Knowledge Questionnaire Score:   Core Components/Risk Factors/Patient Goals at Admission: Personal Goals and Risk Factors at Admission - 05/10/17 1037      Core Components/Risk Factors/Patient Goals on Admission    Weight Management  Yes;Obesity    Intervention  Obesity: Provide education and appropriate resources to help participant work on and attain dietary goals.;Weight Management/Obesity: Establish reasonable short term and long term weight goals.    Expected Outcomes  Short Term: Continue to assess and modify interventions until short term weight is achieved;Long Term: Adherence to nutrition and physical activity/exercise program aimed toward attainment of established weight goal;Weight Loss: Understanding of general recommendations for a balanced deficit meal plan, which promotes 1-2 lb weight loss per week and includes a negative energy balance of [phone] kcal/d;Understanding recommendations for meals to include 15-35% energy as protein, 25-35% energy from fat, 35-60% energy from carbohydrates, less than '200mg'$  of dietary cholesterol, 20-35 gm of total fiber daily;Understanding of distribution of calorie intake throughout the day with the consumption of 4-5 meals/snacks    Tobacco Cessation  Yes    Intervention  Assist the participant in steps to quit. Provide individualized education and counseling about committing to Tobacco Cessation, relapse prevention, and pharmacological support that can be provided by physician.;Advice worker, assist with locating and accessing local/national Quit Smoking programs, and support quit date choice.    Expected Outcomes  Short Term: Will demonstrate readiness to quit, by selecting a quit date.;Short  Term: Will quit all tobacco product use, adhering to prevention of relapse plan.;Long Term: Complete abstinence from all tobacco products for at least 12 months from quit date.    Heart Failure  Yes    Intervention  Provide a combined exercise and nutrition program that is supplemented with education, support and counseling about heart failure. Directed toward relieving symptoms such as shortness of breath, decreased exercise tolerance, and extremity edema.    Expected Outcomes  Improve functional capacity of life;Short term: Attendance in program 2-3 days a week with increased exercise capacity. Reported lower sodium intake. Reported increased fruit and vegetable intake. Reports medication compliance.;Short term: Daily weights obtained and reported for increase. Utilizing diuretic protocols set by physician.;Long term: Adoption of self-care skills and reduction of barriers for  early signs and symptoms recognition and intervention leading to self-care maintenance.       Core Components/Risk Factors/Patient Goals Review:  Goals and Risk Factor Review    Row Name 05/25/17 0947 06/14/17 1359 07/20/17 0706 08/16/17 1711       Core Components/Risk Factors/Patient Goals Review   Personal Goals Review  Weight Management/Obesity;Heart Failure;Tobacco Cessation  Weight Management/Obesity;Heart Failure;Tobacco Cessation  Weight Management/Obesity;Heart Failure;Tobacco Cessation  Weight Management/Obesity;Heart Failure;Tobacco Cessation    Review  Just started program, too early to have met any goals  Has attended 4 exercise sessions, was sick last week, too early to see progression towards goals, heart failure is stable at this point, no weight loss.  patient making slow progress towards goals in pulmonary rehab. her attendance has been "spotty" related to not feeling well and death of a friend. her heartfailure remains stable as well as her weight. she continues to smoke daily and has not desire to stop. will  continue smoking cessation counseling and encourage regular attendance.  patient continues to make slow pregress towards pulmonary rehab goals. she has not been in attendance since 4/25 related to not feeling well. her weight remains stable however she admits to not following a HF protocol of daily weights and low sodium diet. her diabetes remains uncontrolled and she often forgets to take her medications or cant remember if she took them so she will take them again. she has been cautioned to this behavior. she continues to smoke daily. smoking cessation information given. will continue to educate, model correct self care stratagies, and reinforce appropriate self care. will readdress progression towards pulmonary rehab goals over the next 30 days.    Expected Outcomes  see admission goals  see admission goals  see admission goals  see admission goals       Core Components/Risk Factors/Patient Goals at Discharge (Final Review):  Goals and Risk Factor Review - 08/16/17 1711      Core Components/Risk Factors/Patient Goals Review   Personal Goals Review  Weight Management/Obesity;Heart Failure;Tobacco Cessation    Review  patient continues to make slow pregress towards pulmonary rehab goals. she has not been in attendance since 4/25 related to not feeling well. her weight remains stable however she admits to not following a HF protocol of daily weights and low sodium diet. her diabetes remains uncontrolled and she often forgets to take her medications or cant remember if she took them so she will take them again. she has been cautioned to this behavior. she continues to smoke daily. smoking cessation information given. will continue to educate, model correct self care stratagies, and reinforce appropriate self care. will readdress progression towards pulmonary rehab goals over the next 30 days.    Expected Outcomes  see admission goals       ITP Comments: ITP Comments    Row Name 08/16/17 1710            ITP Comments  Dr. Jennet Maduro, Medical Director          Comments: patient has attended 65 pulmonary rehab sessions since admission

## 2017-08-24 ENCOUNTER — Encounter (HOSPITAL_COMMUNITY)
Admission: RE | Admit: 2017-08-24 | Discharge: 2017-08-24 | Disposition: A | Discharge status: Home / Self Care | Payer: BC Managed Care – PPO | Source: Ambulatory Visit | Attending: Internal Medicine | Admitting: Internal Medicine

## 2017-08-24 VITALS — Wt 195.1 lb

## 2017-08-24 DIAGNOSIS — I5022 Chronic systolic (congestive) heart failure: Secondary | ICD-10-CM | POA: Diagnosis not present

## 2017-08-24 NOTE — Progress Notes (Signed)
Daily Session Note  Patient Details  Name: [name] Bean MRN: [id number] Date of Birth: October 15, 1950 Referring Provider:     Pulmonary Rehab Walk Test from 05/13/2017 in [place]  Referring Provider  Dr. Haroldine Laws      Encounter Date: 08/24/2017  Check In: Session Check In - 08/24/17 1538      Check-In   Location  [facility]    Staff Present  Rodney Langton, RN;Molly DiVincenzo, MS, ACSM RCEP, Exercise Physiologist;Carlette Wilber Oliphant, RN, Deland Pretty, MS, ACSM CEP, Exercise Physiologist    Supervising physician immediately available to respond to emergencies  [facility] immediately available    Physician(s)  Dr. Loleta Books    Medication changes reported      No    Fall or balance concerns reported     No    Tobacco Cessation  No Change    Warm-up and Cool-down  Performed as group-led instruction    Resistance Training Performed  Yes    VAD Patient?  No      Pain Assessment   Currently in Pain?  No/denies       Capillary Blood Glucose: No results found for this or any previous visit (from the past 24 hour(s)). POCT Glucose - 08/24/17 1649      POCT Blood Glucose   Pre-Exercise  193 mg/dL    Post-Exercise  140 mg/dL      Exercise Prescription Changes - 08/24/17 1600      Response to Exercise   Blood Pressure (Admit)  100/58    Blood Pressure (Exercise)  110/50    Blood Pressure (Exit)  108/72    Heart Rate (Admit)  83 bpm    Heart Rate (Exercise)  98 bpm    Heart Rate (Exit)  88 bpm    Oxygen Saturation (Admit)  99 %    Oxygen Saturation (Exercise)  98 %    Oxygen Saturation (Exit)  100 %    Rating of Perceived Exertion (Exercise)  11    Perceived Dyspnea (Exercise)  0    Duration  Progress to 45 minutes of aerobic exercise without signs/symptoms of physical distress    Intensity  THRR unchanged      Progression   Progression  Continue to progress workloads to maintain intensity without  signs/symptoms of physical distress.      Resistance Training   Training Prescription  Yes    Weight  orange bands    Reps  10-15    Time  10 Minutes      NuStep   Level  7    Minutes  25.5    METs  2.4      Track   Laps  10    Minutes  25.5       Social History   Tobacco Use  Smoking Status Current Every Day Smoker  . Packs/day: 0.25  . Years: 4.00  . Pack years: 1.00  Smokeless Tobacco Never Used  Tobacco Comment   smokes two a day    Goals Met:  Exercise tolerated well No report of cardiac concerns or symptoms Strength training completed today  Goals Unmet:  Not Applicable  Comments: Service time is from 1330 to 1500    Dr. Rush Farmer is Medical Director for Pulmonary Rehab at [facility].

## 2017-08-26 ENCOUNTER — Encounter (HOSPITAL_COMMUNITY)
Admission: RE | Admit: 2017-08-26 | Discharge: 2017-08-26 | Disposition: A | Discharge status: Home / Self Care | Payer: BC Managed Care – PPO | Source: Ambulatory Visit | Attending: Internal Medicine | Admitting: Internal Medicine

## 2017-08-26 DIAGNOSIS — I5022 Chronic systolic (congestive) heart failure: Secondary | ICD-10-CM

## 2017-08-29 ENCOUNTER — Other Ambulatory Visit: Payer: Self-pay | Admitting: Family Medicine

## 2017-08-29 ENCOUNTER — Other Ambulatory Visit (HOSPITAL_COMMUNITY): Payer: Self-pay | Admitting: Family Medicine

## 2017-08-29 DIAGNOSIS — F32A Depression, unspecified: Secondary | ICD-10-CM

## 2017-08-29 DIAGNOSIS — F419 Anxiety disorder, unspecified: Principal | ICD-10-CM

## 2017-08-29 DIAGNOSIS — F329 Major depressive disorder, single episode, unspecified: Secondary | ICD-10-CM

## 2017-08-30 NOTE — Telephone Encounter (Signed)
Patient is almost due for 4-6 week follow-up and has not future appts scheduled.  Called patient and left message to return call to schedule appointment.

## 2017-08-31 ENCOUNTER — Encounter (HOSPITAL_COMMUNITY): Payer: BC Managed Care – PPO

## 2017-08-31 NOTE — Telephone Encounter (Signed)
Patient scheduled appointment. Medication filled to pharmacy as requested.

## 2017-09-01 ENCOUNTER — Ambulatory Visit: Payer: BC Managed Care – PPO | Admitting: Family Medicine

## 2017-09-02 ENCOUNTER — Encounter (HOSPITAL_COMMUNITY): Payer: BC Managed Care – PPO

## 2017-09-07 ENCOUNTER — Other Ambulatory Visit (HOSPITAL_COMMUNITY): Payer: Self-pay | Admitting: Cardiology

## 2017-09-07 ENCOUNTER — Encounter (HOSPITAL_COMMUNITY): Payer: BC Managed Care – PPO

## 2017-09-07 MED ORDER — VARENICLINE TARTRATE 1 MG PO TABS: 1.0000 mg | ORAL_TABLET | Freq: Two times a day (BID) | ORAL | 6 refills | Status: DC

## 2017-09-08 ENCOUNTER — Ambulatory Visit: Payer: BC Managed Care – PPO | Admitting: Family Medicine

## 2017-09-12 ENCOUNTER — Other Ambulatory Visit (HOSPITAL_COMMUNITY): Payer: Self-pay | Admitting: Family Medicine

## 2017-09-17 NOTE — Telephone Encounter (Signed)
Pt pharmacy request for a 90 days supply, please Advise if ok

## 2017-09-20 NOTE — Addendum Note (Signed)
Encounter addended by: Gaspar Bidding on: 09/20/2017 10:49 AM  Actions taken: Visit Navigator Flowsheet section accepted

## 2017-09-22 ENCOUNTER — Ambulatory Visit (INDEPENDENT_AMBULATORY_CARE_PROVIDER_SITE_OTHER): Payer: BC Managed Care – PPO | Admitting: Pulmonary Disease

## 2017-09-22 ENCOUNTER — Encounter: Payer: Self-pay | Admitting: Pulmonary Disease

## 2017-09-22 VITALS — BP 124/72 | HR 77 | Ht 62.0 in | Wt 193.2 lb

## 2017-09-22 DIAGNOSIS — R0683 Snoring: Secondary | ICD-10-CM

## 2017-09-22 NOTE — Progress Notes (Signed)
[facility]  Chief Complaint  Patient presents with  . sleep consult    Pt referred by Dr. Grier Mitts MD. Pt falls asleep for the night at 1am to 8am and up 4-5 times each night. Pt takes 1 nap everyday from 3-6pm each day. Pt stays sleepy all day. Pt is very restless during the night tossing and turning all night.    Vital signs: BP 124/72 (BP Location: Left Arm, Cuff Size: Normal)   Pulse 77   Ht _0  (1.575 m)   Wt 193 lb 3.2 oz (87.6 kg)   SpO2 98%   BMI 35.34 kg/m   History of Present Illness: [name] Bean is a 67 y.o. female for evaluation of sleep problems.  She was in pulmonary rehab.  Director say her fall asleep and snore, and told her she needs assessment for sleep apnea.  She has been told she stops breathing while asleep.  Can't sleep on her back.  Tired and falls asleep easily during the day.  She goes to sleep at 12 am.  She falls asleep quickly.  She wakes up 2 times to use the bathroom.  She gets out of bed at 9 am.  She feels tired in the morning.  She denies morning headache.  She does not use anything to help her fall sleep or stay awake.  She denies sleep walking, sleep talking, bruxism, or nightmares.  There is no history of restless legs.  She denies sleep hallucinations, sleep paralysis, or cataplexy.  The Epworth score is 12 out of 24.    Physical Exam:  General - pleasant Eyes - pupils reactive ENT - no sinus tenderness, no oral exudate, no LAN Cardiac - regular, no murmur Chest - no wheeze, rales Abd - soft, non tender Ext - no edema Skin - no rashes Neuro - normal strength Psych - normal mood  Discussion: She has snoring, sleep disruption, apnea, and daytime sleepiness.  She has hx of CAD, HTN.  I am concerned she could have sleep apnea.  We discussed how sleep apnea can affect various health problems, including risks for hypertension, cardiovascular disease, and diabetes.  We also  discussed how sleep disruption can increase risks for accidents, such as while driving.  Weight loss as a means of improving sleep apnea was also reviewed.  Additional treatment options discussed were CPAP therapy, oral appliance, and surgical intervention.   Assessment/Plan:  Snoring. - will arrange for in lab sleep study  Tobacco abuse. - continue chantix   Patient Instructions  Will arrange for in lab sleep study Will call to arrange for follow up after sleep study reviewed     Chesley Mires, MD [place] 09/22/2017, 10:39 AM  Flow Sheet  Sleep tests:  Review of Systems: Constitutional: Positive for unexpected weight change. Negative for fever.  HENT: Negative for congestion, dental problem, ear pain, nosebleeds, postnasal drip, rhinorrhea, sinus pressure, sneezing, sore throat and trouble swallowing.   Eyes: Negative for redness and itching.  Respiratory: Positive for shortness of breath and wheezing. Negative for cough and chest tightness.   Cardiovascular: Negative for palpitations and leg swelling.  Gastrointestinal: Negative for nausea and vomiting.  Genitourinary: Negative for dysuria.  Musculoskeletal: Negative for joint swelling.  Skin: Negative for rash.  Allergic/Immunologic: Negative.  Negative for environmental allergies, food allergies and immunocompromised state.  Neurological: Negative for headaches.  Hematological: Does not bruise/bleed easily.  Psychiatric/Behavioral: Negative for dysphoric mood. The patient is  nervous/anxious.    Past Medical History: She  has a past medical history of Anemia, Anxiety, Arthritis, Asthma, Broken arm, Bronchitis, Bursitis of left hip, CAD (coronary artery disease), Candidiasis, vagina, Cardiac arrest ([place]) (06/20/2016), Cerumen impaction, CHF (congestive heart failure) ([place]), Colon, diverticulosis, Depression, Diabetes mellitus, Fatigue, Gastroenteritis, Hot flashes, menopausal, Hyperlipidemia, Knee  pain, left, Lumbar back pain, Maxillary sinusitis, Muscle tear, Nausea, Other B-complex deficiencies, Otitis media, acute, Peripheral neuropathy, Spinal stenosis of lumbar region, Tubulovillous adenoma of colon, and Unspecified essential hypertension.  Past Surgical History: She  has a past surgical history that includes Cervical spine surgery (2003); Eye surgery; Cardiac catheterization (2015, 2009, 2006); Coronary angioplasty with stent (2002, 2015); and Posterior cervical laminectomy (N/A, 10/23/2016).  Family History: Her family history includes Alcohol abuse in her father; Diabetes in her mother; Hypertension in her mother; Pneumonia in her father.  Social History: She  reports that she has been smoking cigarettes.  She has a 11.50 pack-year smoking history. She has never used smokeless tobacco. She reports that she drinks alcohol. She reports that she has current or past drug history. Drug: Marijuana.  Medications: Allergies as of 09/22/2017      Reactions   Sitagliptin Other (See Comments)   Had pancreatitis while on Januvia       Medication List        Accurate as of 09/22/17 10:39 AM. Always use your most recent med list.          ACCU-CHEK COMPACT CARE KIT Kit Accu-Chek Compact Plus Care kit  USE AS INSTRUCTED.   aspirin EC 81 MG tablet Take 81 mg by mouth daily.   BASAGLAR KWIKPEN 100 UNIT/ML Sopn INJECT 32 UNITS SUBCUTANEOUSLY NIGHTLY   BD PEN NEEDLE NANO U/F 32G X 4 MM Misc Generic drug:  Insulin Pen Needle USE TO INJECT INSULIN 5 TIMES A DAY   carvedilol 25 MG tablet Commonly known as:  COREG Take 1 tablet (25 mg total) by mouth 2 (two) times daily with a meal.   glucose blood test strip Commonly known as:  FREESTYLE TEST STRIPS 1 each by Other route daily. (accu-chek guide) DX E11.9   hydrALAZINE 50 MG tablet Commonly known as:  APRESOLINE Take 50-100 mg by mouth See admin instructions. Take 100 mg by mouth in the morning and take 50 mg by mouth in the  evening   latanoprost 0.005 % ophthalmic solution Commonly known as:  XALATAN Place 1 drop into both eyes at bedtime.   losartan 100 MG tablet Commonly known as:  COZAAR Take 100 mg by mouth daily.   LUMIGAN 0.01 % Soln Generic drug:  bimatoprost Lumigan 0.01 % eye drops  PUT 1 DROP INTO BOTH EYES AT BEDTIME   NOVOLOG FLEXPEN 100 UNIT/ML FlexPen Generic drug:  insulin aspart INJECT 12 UNITS INTO THE SKIN 3 (THREE) TIMES DAILY WITH MEALS.   rosuvastatin 20 MG tablet Commonly known as:  CRESTOR Take 20 mg by mouth daily.   sertraline 25 MG tablet Commonly known as:  ZOLOFT Take 2 tablets (50 mg total) by mouth daily.   spironolactone 25 MG tablet Commonly known as:  ALDACTONE Take 25 mg by mouth daily.   torsemide 20 MG tablet Commonly known as:  DEMADEX Take 20-40 mg by mouth See admin instructions. Take 20 mg by mouth daily and may take 20-40 mg by mouth as needed for swelling   varenicline 1 MG tablet Commonly known as:  CHANTIX CONTINUING MONTH PAK Take 1 tablet (1 mg total) by  mouth 2 (two) times daily.

## 2017-09-22 NOTE — Progress Notes (Signed)
   Subjective:    Patient ID: [name] Bean, female    DOB: 25-Nov-1950, 67 y.o.   MRN: [id number]  HPI    Review of Systems  Constitutional: Positive for unexpected weight change. Negative for fever.  HENT: Negative for congestion, dental problem, ear pain, nosebleeds, postnasal drip, rhinorrhea, sinus pressure, sneezing, sore throat and trouble swallowing.   Eyes: Negative for redness and itching.  Respiratory: Positive for shortness of breath and wheezing. Negative for cough and chest tightness.   Cardiovascular: Negative for palpitations and leg swelling.  Gastrointestinal: Negative for nausea and vomiting.  Genitourinary: Negative for dysuria.  Musculoskeletal: Negative for joint swelling.  Skin: Negative for rash.  Allergic/Immunologic: Negative.  Negative for environmental allergies, food allergies and immunocompromised state.  Neurological: Negative for headaches.  Hematological: Does not bruise/bleed easily.  Psychiatric/Behavioral: Negative for dysphoric mood. The patient is nervous/anxious.        Objective:   Physical Exam        Assessment & Plan:

## 2017-09-22 NOTE — Patient Instructions (Signed)
Will arrange for in lab sleep study Will call to arrange for follow up after sleep study reviewed  

## 2017-09-24 ENCOUNTER — Other Ambulatory Visit (HOSPITAL_COMMUNITY): Payer: Self-pay | Admitting: Family Medicine

## 2017-09-24 NOTE — Addendum Note (Signed)
Encounter addended by: Ivonne Andrew, RD on: 09/24/2017 9:52 AM  Actions taken: Flowsheet data copied forward, Visit Navigator Flowsheet section accepted

## 2017-09-28 ENCOUNTER — Encounter: Payer: Self-pay | Admitting: Family Medicine

## 2017-09-28 ENCOUNTER — Ambulatory Visit: Payer: BC Managed Care – PPO | Admitting: Family Medicine

## 2017-09-28 VITALS — BP 108/62 | HR 108 | Temp 98.7°F | Wt 191.0 lb

## 2017-09-28 DIAGNOSIS — Z794 Long term (current) use of insulin: Secondary | ICD-10-CM | POA: Diagnosis not present

## 2017-09-28 DIAGNOSIS — F419 Anxiety disorder, unspecified: Secondary | ICD-10-CM

## 2017-09-28 DIAGNOSIS — F32A Depression, unspecified: Secondary | ICD-10-CM

## 2017-09-28 DIAGNOSIS — E119 Type 2 diabetes mellitus without complications: Secondary | ICD-10-CM | POA: Diagnosis not present

## 2017-09-28 DIAGNOSIS — F329 Major depressive disorder, single episode, unspecified: Secondary | ICD-10-CM

## 2017-09-28 LAB — POCT GLYCOSYLATED HEMOGLOBIN (HGB A1C): HEMOGLOBIN A1C: 8 % — AB (ref 4.0–5.6)

## 2017-09-28 MED ORDER — INSULIN ASPART 100 UNIT/ML FLEXPEN: PEN_INJECTOR | SUBCUTANEOUS | 5 refills | Status: DC

## 2017-09-28 MED ORDER — SERTRALINE HCL 50 MG PO TABS: 50.0000 mg | ORAL_TABLET | Freq: Every day | ORAL | 1 refills | Status: DC

## 2017-09-28 NOTE — Patient Instructions (Signed)
Coping With Diabetes Diabetes (type 1 diabetes mellitus or type 2 diabetes mellitus) is a condition in which the body does not have enough of a hormone called insulin, or the body does not respond properly to insulin. Normally, insulin allows sugars (glucose) to enter cells in the body. The cells use glucose for energy. With diabetes, extra glucose builds up in the blood instead of going into cells, which results in high blood glucose (hyperglycemia). How to manage lifestyle changes Managing diabetes includes medical treatments as well as lifestyle changes. If diabetes is not managed well, serious physical and emotional complications can occur. Taking good care of yourself means that you are responsible for:  Monitoring glucose regularly.  Eating a healthy diet.  Exercising regularly.  Meeting with health care providers.  Taking medicines as directed.  Some people may feel a lot of stress about managing their diabetes. This is known as emotional distress, and it is very common. Living with diabetes can place you at risk for emotional distress, depression, or anxiety. These disorders can be confusing and can make diabetes management more difficult. How to recognize stress Emotional distress Symptoms of emotional distress include:  Anger about having a diagnosis of diabetes.  Fear or frustration about your diagnosis and the changes you need to make to manage the condition.  Being overly worried about the care that you need or the cost of the care you need.  Feeling like you caused your condition by doing something wrong.  Fear of unpredictable situations, like low or high blood glucose.  Feeling judged by your health care providers.  Feeling very alone with the disease.  Getting too tired or "burned out" with the demands of daily care.  Depression Having diabetes means that you are at a higher risk for depression. Having depression also means that you are at a higher risk for  diabetes. Your health care provider may test (screen) you for symptoms of depression. It is important to recognize depression symptoms and to start treatment for it soon after it is diagnosed. The following are some symptoms of depression:  Loss of interest in things that you used to enjoy.  Trouble sleeping, or often waking up early and not being able to get back to sleep.  A change in appetite.  Feeling tired most of the day.  Feeling nervous and anxious.  Feeling guilty and worrying that you are a burden to others.  Feeling depressed more often than you do not feel that way.  Thoughts of hurting yourself or feeling that you want to die.  If you have any of these symptoms for 2 weeks or longer, reach out to a health care provider. Where to find support  Ask your health care provider to recommend a therapist who understands both depression and diabetes.  Search for information and support from the American Diabetes Association: www.diabetes.org  Find a certified diabetes educator and make an appointment through American Association of Diabetes Educators: www.diabeteseducator.org Follow these instructions at home: Managing emotional distress The following are some ways to manage emotional distress:  Talk with your health care provider or certified diabetes educator. Consider working with a counselor or therapist.  Learn as much as you can about diabetes and its treatment. Meet with a certified diabetes educator or take a class to learn how to manage your condition.  Keep a journal of your thoughts and concerns.  Accept that some things are out of your control.  Talk with other people who have diabetes. It   can help to talk with others about the emotional distress that you feel.  Find ways to manage stress that work for you. These may include art or music therapy, exercise, meditation, and hobbies.  Seek support from spiritual leaders, family, and friends.  General  instructions  Follow your diabetes management plan.  Keep all follow-up visits as told by your health care provider. This is important. Get help right away if:  You have thoughts about hurting yourself or others. If you ever feel like you may hurt yourself or others, or have thoughts about taking your own life, get help right away. You can go to your nearest emergency department or call:  Your local emergency services (911 in the [place]  A suicide crisis helpline, such as the National Suicide Prevention Lifeline at 1-800-273-8255. This is open 24 hours a day.  Summary  Diabetes (type 1 diabetes mellitus or type 2 diabetes mellitus) is a condition in which the body does not have enough of a hormone called insulin, or the body does not respond properly to insulin.  Living with diabetes puts you at risk for medical issues, and it also puts you at risk for emotional issues such as emotional distress, depression, and anxiety.  Recognizing the symptoms of emotional distress and depression may help you avoid problems with your diabetes control. It is important to start treatment for emotional distress and depression soon after they are diagnosed.  Having diabetes means that you are at a higher risk for depression. Ask your health care provider to recommend a therapist who understands both depression and diabetes.  If you experience symptoms of emotional distress or depression, it is important to discuss this with your health care provider, certified diabetes educator, or therapist. This information is not intended to replace advice given to you by your health care provider. Make sure you discuss any questions you have with your health care provider. Document Released: 08/13/2016 Document Revised: 08/13/2016 Document Reviewed: 08/13/2016 Elsevier Interactive Patient Education  2018 Elsevier Inc.  

## 2017-09-28 NOTE — Progress Notes (Signed)
Subjective:    Patient ID: [name] Bean, female    DOB: 07-Jun-1950, 67 y.o.   MRN: [id number]  No chief complaint on file.   HPI Patient was seen today for f/u.    Pt states the "world is brighter" now that she is taking zoloft 50 mg daily.  Pt was taking Lexapro, but was weaned off.  Pt states she has been cleaning her house and walking in the mornings.  Patient endorses increased energy, improved sleep and mood.  DM:   Pt taking basaglar 38 units and novolog 12 units TID.  Pt has been using flexpens but states the pharmacy tried to give her vials.  Pt has been eating a few things she should not have including salt water taffy.  Pt is hoping to lose 10 pounds by her birthday.  Patient states she has been seen by pulmonology and has an upcoming sleep study on July 3.  Past Medical History:  Diagnosis Date  . Anemia   . Anxiety   . Arthritis   . Asthma   . Broken arm    left arm  . Bronchitis   . Bursitis of left hip   . CAD (coronary artery disease)   . Candidiasis, vagina   . Cardiac arrest ([place]) 06/20/2016   at [facility]; ? due to flash pulm edema vs undertreated chronic CHF  . Cerumen impaction   . CHF (congestive heart failure) ([place])    RECENT ADMIT TO [facility]   . Colon, diverticulosis   . Depression   . Diabetes mellitus    15 YRS AGO  . Fatigue   . Gastroenteritis   . Hot flashes, menopausal   . Hyperlipidemia   . Knee pain, left   . Lumbar back pain   . Maxillary sinusitis    history  . Muscle tear    right gluteus  . Nausea   . Other B-complex deficiencies   . Otitis media, acute    left  . Peripheral neuropathy   . Spinal stenosis of lumbar region   . Tubulovillous adenoma of colon   . Unspecified essential hypertension     Allergies  Allergen Reactions  . Sitagliptin Other (See Comments)    Had pancreatitis while on Januvia     ROS General: Denies fever, chills, night sweats, changes in weight, changes in appetite HEENT: Denies headaches,  ear pain, changes in vision, rhinorrhea, sore throat CV: Denies CP, palpitations, SOB, orthopnea Pulm: Denies SOB, cough, wheezing GI: Denies abdominal pain, nausea, vomiting, diarrhea, constipation GU: Denies dysuria, hematuria, frequency, vaginal discharge Msk: Denies muscle cramps, joint pains Neuro: Denies weakness, numbness, tingling Skin: Denies rashes, bruising Psych: Denies hallucinations  +improved anxiety and depression.     Objective:    Blood pressure 108/62, pulse (!) 108, temperature 98.7 F (37.1 C), temperature source Oral, weight 191 lb (86.6 kg), SpO2 98 %.   Gen. Pleasant, well-nourished, in no distress, normal affect   HEENT: [place]/AT, face symmetric, no scleral icterus, PERRLA,  nares patent without drainage Lungs: no accessory muscle use, CTAB, no wheezes or rales Cardiovascular: RRR, no m/r/g, no peripheral edema Neuro:  A&Ox3, CN II-XII intact   Wt Readings from Last 3 Encounters:  09/28/17 191 lb (86.6 kg)  09/22/17 193 lb 3.2 oz (87.6 kg)  08/24/17 195 lb 1.7 oz (88.5 kg)    Lab Results  Component Value Date   WBC 6.2 10/19/2016   HGB 11.8 (L) 10/19/2016   HCT 35.8 (L) 10/19/2016  PLT 218 10/19/2016   GLUCOSE 257 (H) 06/24/2017   CHOL 190 01/02/2011   TRIG 167.0 (H) 01/02/2011   HDL 52.30 01/02/2011   LDLCALC 104 (H) 01/02/2011   ALT 11 (L) 09/09/2016   AST 19 09/09/2016   NA 137 06/24/2017   K 3.6 06/24/2017   CL 101 06/24/2017   CREATININE 0.69 06/24/2017   BUN 7 06/24/2017   CO2 24 06/24/2017   TSH 0.981 10/28/2010   INR 1.0 RATIO 10/27/2007   HGBA1C 9.1 04/15/2017   MICROALBUR 0.67 01/02/2011    Assessment/Plan:  Anxiety and depression  -PHQ 9 score 2, was 14 on 4/26 -Gad 7 score 1, was 8 on 4/26 - Plan: sertraline (ZOLOFT) 50 MG tablet  Type 2 diabetes mellitus without complication, with long-term current use of insulin (HCC)  -Hgb A1C 8% -discussed decreasing the amount of sweets and carbohydrates eaten -continue  exercise. -Continue Basaglar 30 units qhs and NovoLog 12 units 3 times daily with meals - Plan: insulin aspart (NOVOLOG FLEXPEN) 100 UNIT/ML FlexPen  F/u in 2-3 months  Grier Mitts, MD

## 2017-10-13 ENCOUNTER — Ambulatory Visit (HOSPITAL_BASED_OUTPATIENT_CLINIC_OR_DEPARTMENT_OTHER): Payer: BC Managed Care – PPO | Attending: Pulmonary Disease | Admitting: Pulmonary Disease

## 2017-10-13 VITALS — Ht 62.0 in | Wt 191.0 lb

## 2017-10-13 DIAGNOSIS — G4733 Obstructive sleep apnea (adult) (pediatric): Secondary | ICD-10-CM | POA: Insufficient documentation

## 2017-10-13 DIAGNOSIS — R0683 Snoring: Secondary | ICD-10-CM

## 2017-10-13 DIAGNOSIS — G4730 Sleep apnea, unspecified: Secondary | ICD-10-CM | POA: Diagnosis present

## 2017-10-14 ENCOUNTER — Ambulatory Visit (HOSPITAL_COMMUNITY): Payer: Self-pay | Admitting: *Deleted

## 2017-10-14 DIAGNOSIS — I5022 Chronic systolic (congestive) heart failure: Secondary | ICD-10-CM

## 2017-10-14 NOTE — Progress Notes (Signed)
Discharge Progress Report  Patient Details  Name: [name] Bean MRN: [id number] Date of Birth: 1950/12/05 Referring Provider:     Pulmonary Rehab Walk Test from 05/13/2017 in [place]  Referring Provider  Dr. Haroldine Laws       Number of Visits: 19 Reason for Discharge:  Patient reached a stable level of exercise. Patient independent in their exercise. Patient has met program and personal goals.  Smoking History:  Social History   Tobacco Use  Smoking Status Current Every Day Smoker  . Packs/day: 0.25  . Years: 46.00  . Pack years: 11.50  . Types: Cigarettes  Smokeless Tobacco Never Used  Tobacco Comment   smokes two a day    Diagnosis:  Chronic systolic HF (heart failure) (HCC)  ADL [facility]SD: Pulmonary Assessment Scores    Row Name 08/26/17 1708 08/27/17 1033       ADL UCSD   ADL Phase  Exit  Exit    SOB Score total  4  -      CAT Score   CAT Score  1 Exit  -      mMRC Score   mMRC Score  -  0       Initial Exercise Prescription:   Discharge Exercise Prescription (Final Exercise Prescription Changes): Exercise Prescription Changes - 08/24/17 1600      Response to Exercise   Blood Pressure (Admit)  100/58    Blood Pressure (Exercise)  110/50    Blood Pressure (Exit)  108/72    Heart Rate (Admit)  83 bpm    Heart Rate (Exercise)  98 bpm    Heart Rate (Exit)  88 bpm    Oxygen Saturation (Admit)  99 %    Oxygen Saturation (Exercise)  98 %    Oxygen Saturation (Exit)  100 %    Rating of Perceived Exertion (Exercise)  11    Perceived Dyspnea (Exercise)  0    Duration  Progress to 45 minutes of aerobic exercise without signs/symptoms of physical distress    Intensity  THRR unchanged      Progression   Progression  Continue to progress workloads to maintain intensity without signs/symptoms of physical distress.      Resistance Training   Training Prescription  Yes    Weight  orange bands    Reps  10-15    Time   10 Minutes      NuStep   Level  7    Minutes  25.5    METs  2.4      Track   Laps  10    Minutes  25.5       Functional Capacity: 6 Minute Walk    Row Name 08/27/17 1031         6 Minute Walk   Phase  Discharge     Distance  900 feet     Distance Feet Change  190 ft     Walk Time  6 minutes     # of Rest Breaks  0     MPH  1.7     METS  2.3     RPE  12     Perceived Dyspnea   1     Comments  used rollator     Resting HR  91 bpm     Resting BP  122/50     Resting Oxygen Saturation   96 %     Exercise Oxygen Saturation  during 6  min walk  95 %     Max Ex. HR  118 bpm     Max Ex. BP  162/82       Interval HR   1 Minute HR  102     2 Minute HR  107     3 Minute HR  107     4 Minute HR  111     5 Minute HR  111     6 Minute HR  118     2 Minute Post HR  99     Interval Heart Rate?  Yes       Interval Oxygen   Interval Oxygen?  Yes     Baseline Oxygen Saturation %  96 %     1 Minute Oxygen Saturation %  95 %     1 Minute Liters of Oxygen  0 L     2 Minute Oxygen Saturation %  96 %     2 Minute Liters of Oxygen  0 L     3 Minute Oxygen Saturation %  96 %     3 Minute Liters of Oxygen  0 L     4 Minute Oxygen Saturation %  97 %     4 Minute Liters of Oxygen  0 L     5 Minute Oxygen Saturation %  97 %     5 Minute Liters of Oxygen  0 L     6 Minute Oxygen Saturation %  97 %     6 Minute Liters of Oxygen  0 L     2 Minute Post Oxygen Saturation %  97 %     2 Minute Post Liters of Oxygen  0 L        Psychological, QOL, Others - Outcomes: PHQ 2/9: Depression screen [facility] 2/9 09/28/2017 08/26/2017 08/06/2017 05/10/2017  Decreased Interest '1 2 3 1  '$ Down, Depressed, Hopeless 1 0 1 1  PHQ - 2 Score '2 2 4 2  '$ Altered sleeping 0 '1 2 1  '$ Tired, decreased energy 0 '2 2 1  '$ Change in appetite 0 '1 2 1  '$ Feeling bad or failure about yourself  0 0 3 1  Trouble concentrating 0 0 1 0  Moving slowly or fidgety/restless 0 0 0 0  Suicidal thoughts 0 0 0 0  PHQ-9 Score '2 6 14  6  '$ Difficult doing work/chores - Not difficult at all - Not difficult at all    Quality of Life:   Personal Goals: Goals established at orientation with interventions provided to work toward goal.    Personal Goals Discharge: Goals and Risk Factor Review    Row Name 08/16/17 1711 10/14/17 1445           Core Components/Risk Factors/Patient Goals Review   Personal Goals Review  Weight Management/Obesity;Heart Failure;Tobacco Cessation  -      Review  patient continues to make slow pregress towards pulmonary rehab goals. she has not been in attendance since 4/25 related to not feeling well. her weight remains stable however she admits to not following a HF protocol of daily weights and low sodium diet. her diabetes remains uncontrolled and she often forgets to take her medications or cant remember if she took them so she will take them again. she has been cautioned to this behavior. she continues to smoke daily. smoking cessation information given. will continue to educate, model correct self care stratagies, and reinforce appropriate self care. will readdress progression  towards pulmonary rehab goals over the next 30 days.  Pt graduates with the completion of 19 exercise sesions. pt with somewhat improved compliance to HF protocol, dm management and continues to smoke but admits she has cut down      Expected Outcomes  see admission goals  -         Exercise Goals and Review:   Nutrition & Weight - Outcomes:    Nutrition: Nutrition Therapy & Goals - 08/20/17 1342      Nutrition Therapy   Diet  Carb Modified, Heart Healthy Low Sodium       Personal Nutrition Goals   Nutrition Goal  Identify food quantities necessary to achieve wt loss of  -2# per week to a goal wt loss of 2.7-10.9 kg (6-24 lb) at graduation from pulmonary rehab.    Personal Goal #2  Pt to increase her consumption of whole grains, fruit and vegetables. Pt to aim for at least 1 serving of fruit or vegetable at  all meals.     Personal Goal #3  Improved glycemic control as evidenced by pt's A1c trending from 9.1 to less than 7    Personal Goal #4  Pt to have a calming ritual (e.g. Hot tea) when she wants to tell herself "I'm done with eating for the day."      Intervention Plan   Intervention  Prescribe, educate and counsel regarding individualized specific dietary modifications aiming towards targeted core components such as weight, hypertension, lipid management, diabetes, heart failure and other comorbidities.    Expected Outcomes  Short Term Goal: Understand basic principles of dietary content, such as calories, fat, sodium, cholesterol and nutrients.;Long Term Goal: Adherence to prescribed nutrition plan.       Nutrition Discharge: Nutrition Assessments - 09/08/17 1104      Rate Your Plate Scores   Pre Score  47    Post Score  -- pt returned survey incomplete       Education Questionnaire Score: Knowledge Questionnaire Score - 08/26/17 1707      Knowledge Questionnaire Score   Post Score  18/18       Goals reviewed with patient. Cherre Huger, BSN Cardiac and Training and development officer

## 2017-10-16 ENCOUNTER — Telehealth: Payer: Self-pay | Admitting: Pulmonary Disease

## 2017-10-16 ENCOUNTER — Encounter: Payer: Self-pay | Admitting: Pulmonary Disease

## 2017-10-16 DIAGNOSIS — R0683 Snoring: Secondary | ICD-10-CM | POA: Diagnosis not present

## 2017-10-16 DIAGNOSIS — G4733 Obstructive sleep apnea (adult) (pediatric): Secondary | ICD-10-CM

## 2017-10-16 HISTORY — DX: Obstructive sleep apnea (adult) (pediatric): G47.33

## 2017-10-16 NOTE — Telephone Encounter (Signed)
PSG 10/13/17 >> AHI 35.9, SpO2 low 88%.     Will have my nurse inform pt that sleep study shows severe sleep apnea.  Options are 1) CPAP now, 2) ROV first.  If She is agreeable to CPAP, then please send order for auto CPAP range 5 to 20 cm H2O with heated humidity and mask of choice.  Have download sent 1 month after starting CPAP and set up ROV 2 months after starting CPAP.  ROV can be with me or NP.

## 2017-10-16 NOTE — Procedures (Signed)
    Patient Name: [name] Bean, [place] Date: 10/13/2017 Gender: Female D.O.B: Mar 27, 1951 Age (years): 11 Referring Provider: Chesley Mires MD, ABSM Height (inches): 62 Interpreting Physician: Chesley Mires MD, ABSM Weight (lbs): 191 RPSGT: Zadie Rhine BMI: 35 MRN: [id number] Neck Size: 17.00 <br> <br> CLINICAL INFORMATION Sleep Study Type: Split Night CPAP    Indication for sleep study: Snoring    Epworth Sleepiness Score: 6  SLEEP STUDY TECHNIQUE As per the AASM Manual for the Scoring of Sleep and Associated Events v2.3 (April 2016) with a hypopnea requiring 4% desaturations.  The channels recorded and monitored were frontal, central and occipital EEG, electrooculogram (EOG), submentalis EMG (chin), nasal and oral airflow, thoracic and abdominal wall motion, anterior tibialis EMG, snore microphone, electrocardiogram, and pulse oximetry. Continuous positive airway pressure (CPAP) was initiated when the patient met split night criteria and was titrated according to treat sleep-disordered breathing.  MEDICATIONS Medications self-administered by patient taken the night of the study : MELATONIN  RESPIRATORY PARAMETERS Diagnostic  Total AHI (/hr): 35.9 RDI (/hr): 55.4 OA Index (/hr): 1.8 CA Index (/hr): 0.0 REM AHI (/hr): N/A NREM AHI (/hr): 35.9 Supine AHI (/hr): N/A Non-supine AHI (/hr): 35.88 Min O2 Sat (%): 88.0 Mean O2 (%): 95.1 Time below 88% (min): 0.1   Titration  Optimal Pressure (cm): 14 AHI at Optimal Pressure (/hr): 4.4 Min O2 at Optimal Pressure (%): 93.0 Supine % at Optimal (%): 0 Sleep % at Optimal (%): 93   SLEEP ARCHITECTURE The recording time for the entire night was 400.5 minutes.  During a baseline period of 174.6 minutes, the patient slept for 132.1 minutes in REM and nonREM, yielding a sleep efficiency of 75.7%%. Sleep onset after lights out was 36.5 minutes with a REM latency of N/A minutes. The patient spent 9.1%% of the night in stage N1 sleep,  90.9%% in stage N2 sleep, 0.0%% in stage N3 and 0.0%% in REM.    During the titration period of 215.7 minutes, the patient slept for 157.5 minutes in REM and nonREM, yielding a sleep efficiency of 73.0%%. Sleep onset after CPAP initiation was 36.7 minutes with a REM latency of 49.0 minutes. The patient spent 5.4%% of the night in stage N1 sleep, 62.2%% in stage N2 sleep, 0.0%% in stage N3 and 32.4%% in REM.  CARDIAC DATA The 2 lead EKG demonstrated sinus rhythm. The mean heart rate was 100.0 beats per minute. Other EKG findings include: PVCs. LEG MOVEMENT DATA The total Periodic Limb Movements of Sleep (PLMS) were 0. The PLMS index was 0.0 .  IMPRESSIONS - Severe obstructive sleep apnea occurred during the diagnostic portion of the study (AHI = 35.9/hour, SpO2 low 88%).  - Optimal CPAP setting of 14 cm H2O.  She did not require supplemental oxygen during this study. DIAGNOSIS - Obstructive Sleep Apnea (327.23 [G47.33 ICD-10]) RECOMMENDATIONS - Trial of CPAP therapy on 14 cm H2O with a Small size Resmed Full Face Mask AirFit F20 mask and heated humidification.  [Electronically signed] 10/16/2017 01:28 PM Chesley Mires, MD, [place], American Board of Sleep Medicine  NPI: [id number]

## 2017-10-18 NOTE — Telephone Encounter (Signed)
Called and spoke with patient regarding results.  Informed the patient of results and recommendations today. Pt agreeable to CPAP, placed order for auto CPAP range 5 to 20 cm H2O with heated humidity and mask of choice. Have download sent 1 month after starting CPAP and set up ROV 2 months after starting CPAP.   Pt verbalized understanding and denied any questions or concerns at this time.  Nothing further needed.

## 2017-10-22 ENCOUNTER — Other Ambulatory Visit (HOSPITAL_COMMUNITY): Payer: Self-pay | Admitting: Internal Medicine

## 2017-10-29 ENCOUNTER — Other Ambulatory Visit (HOSPITAL_COMMUNITY): Payer: Self-pay | Admitting: *Deleted

## 2017-10-29 MED ORDER — SPIRONOLACTONE 25 MG PO TABS: 25.0000 mg | ORAL_TABLET | Freq: Every day | ORAL | 3 refills | Status: DC

## 2017-11-01 ENCOUNTER — Ambulatory Visit: Payer: BC Managed Care – PPO | Admitting: Family Medicine

## 2017-11-01 ENCOUNTER — Encounter: Payer: Self-pay | Admitting: Family Medicine

## 2017-11-01 VITALS — BP 118/68 | HR 78 | Temp 98.6°F | Wt 195.0 lb

## 2017-11-01 DIAGNOSIS — B078 Other viral warts: Secondary | ICD-10-CM | POA: Diagnosis not present

## 2017-11-01 DIAGNOSIS — L989 Disorder of the skin and subcutaneous tissue, unspecified: Secondary | ICD-10-CM | POA: Diagnosis not present

## 2017-11-01 DIAGNOSIS — B373 Candidiasis of vulva and vagina: Secondary | ICD-10-CM | POA: Diagnosis not present

## 2017-11-01 DIAGNOSIS — B3731 Acute candidiasis of vulva and vagina: Secondary | ICD-10-CM

## 2017-11-01 MED ORDER — FLUCONAZOLE 150 MG PO TABS: 150.0000 mg | ORAL_TABLET | Freq: Once | ORAL | 1 refills | Status: AC

## 2017-11-01 NOTE — Patient Instructions (Addendum)
Cryosurgery for Skin Conditions Cryosurgery is when damaged skin or a skin growth is frozen and removed. It is also called cryotherapy. It usually takes a few minutes. It can be done in your doctor's office. What happens before the procedure? You do not have to do anything to get ready for this procedure. What happens during the procedure?  Your doctor may freeze your skin in one of these ways: ? Putting a device (probe) on your skin. The probe has liquid nitrogen in it to keep it cold. ? Swabbing or spraying liquid nitrogen onto your skin.  A bandage (dressing) may be put on the area. These procedures may vary among doctors and clinics. What happens after the procedure?  The treated area will be red and swollen. A blister may form. Summary  Cryosurgery is when damaged skin or a skin growth is removed using cold. It is also called cryotherapy.  Cryosurgery takes a few minutes. It can be done in your doctor's office.  The procedure may be done using a device (probe) or by putting liquid nitrogen on the area. This information is not intended to replace advice given to you by your health care provider. Make sure you discuss any questions you have with your health care provider. Document Released: 06/22/2011 Document Revised: 02/17/2016 Document Reviewed: 02/17/2016 Elsevier Interactive Patient Education  2017 [employer]  Warts Warts are small growths on the skin. They are common and can occur on various areas of the body. A person may have one wart or multiple warts. Most warts are not painful, and they usually do not cause problems. However, warts can cause pain if they are large or occur in an area of the body where pressure will be applied to them, such as the bottom of the foot. In many cases, warts do not require treatment. They usually go away on their own over a period of many months to a couple years. Various treatments may be done for warts that cause problems or do not go away.  Sometimes, warts go away and then come back again. What are the causes? Warts are caused by a type of virus that is called human papillomavirus (HPV). This virus can spread from person to person through direct contact. Warts can also spread to other areas of the body when a person scratches a wart and then scratches another area of his or her body. What increases the risk? Warts are more likely to develop in:  People who are 70-63 years of age.  People who have a weakened body defense system (immune system).  What are the signs or symptoms? A wart may be round or oval or have an irregular shape. Most warts have a rough surface. Warts may range in color from skin color to light yellow, brown, or gray. They are generally less than  inch (1.3 cm) in size. Most warts are painless, but some can be painful when pressure is applied to them. How is this diagnosed? A wart can usually be diagnosed from its appearance. In some cases, a tissue sample may be removed (biopsy) to be looked at under a microscope. How is this treated? In many cases, warts do not need treatment. If treatment is needed, options may include:  Applying medicated solutions, creams, or patches to the wart. These may be over-the-counter or prescription medicines that make the skin soft so that layers will gradually shed away. In many cases, the medicine is applied one or two times per day and covered with  a bandage.  Putting duct tape over the top of the wart (occlusion). You will leave the tape in place for as long as told by your health care provider, then you will replace it with a new strip of tape. This is done until the wart goes away.  Freezing the wart with liquid nitrogen (cryotherapy).  Burning the wart with: ? Laser treatment. ? An electrified probe (electrocautery).  Injection of a medicine (Candida antigen) into the wart to help the body's immune system to fight off the wart.  Surgery to remove the wart.  Follow  these instructions at home:  Apply over-the-counter and prescription medicines only as told by your health care provider.  Do not apply over-the-counter wart medicines to your face or genitals before you ask your health care provider if it is okay to do so.  Do not scratch or pick at a wart.  Wash your hands after you touch a wart.  Avoid shaving hair that is over a wart.  Keep all follow-up visits as told by your health care provider. This is important. Contact a health care provider if:  Your warts do not improve after treatment.  You have redness, swelling, or pain at the site of a wart.  You have bleeding from a wart that does not stop with light pressure.  You have diabetes and you develop a wart. This information is not intended to replace advice given to you by your health care provider. Make sure you discuss any questions you have with your health care provider. Document Released: 01/07/2005 Document Revised: 09/11/2015 Document Reviewed: 06/25/2014 Elsevier Interactive Patient Education  2018 [employer].  Cryoablation, Care After This sheet gives you information about how to care for yourself after your procedure. Your health care provider may also give you more specific instructions. If you have problems or questions, contact your health care provider. What can I expect after the procedure? After the procedure, it is common to have:  Soreness around the treatment area.  Mild pain and swelling in the treatment area.  Follow these instructions at home: Treatment area care   Follow instructions from your health care provider about how to take care of your incision. Make sure you: ? Wash your hands with soap and water before you change your bandage (dressing). If soap and water are not available, use hand sanitizer. ? Change your dressing as told by your health care provider. ? Leave stitches (sutures) in place. They may need to stay in place for 2 weeks or  longer.  Check your treatment area every day for signs of infection. Check for: ? More redness, swelling, or pain. ? More fluid or blood. ? Warmth. ? Pus or a bad smell.  Keep the treated area clean, dry, and covered with a dressing until it has healed. Clean the area with soap and water or as told by your health care provider.  You may shower if your health care provider approves. If your bandage gets wet, change it right away. Activity  Follow instructions from your health care provider about any activity limitations.  Do not drive for 24 hours if you received a medicine to help you relax (sedative). General instructions  Take over-the-counter and prescription medicines only as told by your health care provider.  Keep all follow-up visits as told by your health care provider. This is important. Contact a health care provider if:  You do not have a bowel movement for 2 days.  You have nausea or  vomiting.  You have more redness, swelling, or pain around your treatment area.  You have more fluid or blood coming from your treatment area.  Your treatment area feels warm to the touch.  You have pus or a bad smell coming from your treatment area.  You have a fever. Get help right away if:  You have severe pain.  You have trouble swallowing or breathing.  You have severe weakness or dizziness.  You have chest pain or shortness of breath. This information is not intended to replace advice given to you by your health care provider. Make sure you discuss any questions you have with your health care provider. Document Released: 01/18/2013 Document Revised: 10/18/2015 Document Reviewed: 08/28/2015 Elsevier Interactive Patient Education  [employer].

## 2017-11-01 NOTE — Progress Notes (Signed)
Subjective:    Patient ID: [name] Bean, female    DOB: 1950-05-19, 67 y.o.   MRN: [id number]  No chief complaint on file.   HPI Patient was seen today for acute concern.  Patient endorses vaginal irritation and discharge times several days.  Patient has not tried anything for her symptoms.  Patient is not currently sexually active.  Pt also notes a wart on her left arm as well as a circular lesion on her right upper chest.  Skin lesion is not pruritic.  Pt states she noticed it 1 week ago.  It was slightly erythematous at the time.  Pt was concerned that it could be a sign of breast cancer.  Pt denies any changes in soaps or lotions.  Did have a sleep study a few wks ago.  Notes several "stickers" placed on chest none the size of the current lesion  Past Medical History:  Diagnosis Date  . Anemia   . Anxiety   . Arthritis   . Asthma   . Broken arm    left arm  . Bronchitis   . Bursitis of left hip   . CAD (coronary artery disease)   . Candidiasis, vagina   . Cardiac arrest ([place]) 06/20/2016   at [facility]; ? due to flash pulm edema vs undertreated chronic CHF  . Cerumen impaction   . CHF (congestive heart failure) ([place])    RECENT ADMIT TO [facility]   . Colon, diverticulosis   . Depression   . Diabetes mellitus    15 YRS AGO  . Fatigue   . Gastroenteritis   . Hot flashes, menopausal   . Hyperlipidemia   . Knee pain, left   . Lumbar back pain   . Maxillary sinusitis    history  . Muscle tear    right gluteus  . Nausea   . OSA (obstructive sleep apnea) 10/16/2017  . Other B-complex deficiencies   . Otitis media, acute    left  . Peripheral neuropathy   . Spinal stenosis of lumbar region   . Tubulovillous adenoma of colon   . Unspecified essential hypertension     Allergies  Allergen Reactions  . Sitagliptin Other (See Comments)    Had pancreatitis while on Januvia     ROS General: Denies fever, chills, night sweats, changes in weight, changes in appetite HEENT:  Denies headaches, ear pain, changes in vision, rhinorrhea, sore throat CV: Denies CP, palpitations, SOB, orthopnea Pulm: Denies SOB, cough, wheezing GI: Denies abdominal pain, nausea, vomiting, diarrhea, constipation GU: Denies dysuria, hematuria, frequency    +vaginal discharge and irritation Msk: Denies muscle cramps, joint pains Neuro: Denies weakness, numbness, tingling Skin: Denies rashes, bruising  +lesion of chest, wart L thumb Psych: Denies depression, anxiety, hallucinations     Objective:    Blood pressure 118/68, pulse 78, temperature 98.6 F (37 C), temperature source Oral, weight 195 lb (88.5 kg), SpO2 97 %.   Gen. Pleasant, well-nourished, in no distress, normal affect   HEENT: [place]/AT, face symmetric,  no scleral icterus, PERRLA, nares patent without drainage Lungs: no accessory muscle use, CTAB, no wheezes or rales Cardiovascular: RRR, no m/r/g, no peripheral edema Neuro:  A&Ox3, CN II-XII intact, normal gait Skin:  Warm, no rash.  L thumb with verrucae.  Cryotherapy used on verrucae.  R upper chest with well circumscribed hyprepigmented with normal appearing skin in the center.      Wt Readings from Last 3 Encounters:  11/01/17 195 lb (88.5 kg)  10/13/17 191 lb (86.6 kg)  09/28/17 191 lb (86.6 kg)    Lab Results  Component Value Date   WBC 6.2 10/19/2016   HGB 11.8 (L) 10/19/2016   HCT 35.8 (L) 10/19/2016   PLT 218 10/19/2016   GLUCOSE 257 (H) 06/24/2017   CHOL 190 01/02/2011   TRIG 167.0 (H) 01/02/2011   HDL 52.30 01/02/2011   LDLCALC 104 (H) 01/02/2011   ALT 11 (L) 09/09/2016   AST 19 09/09/2016   NA 137 06/24/2017   K 3.6 06/24/2017   CL 101 06/24/2017   CREATININE 0.69 06/24/2017   BUN 7 06/24/2017   CO2 24 06/24/2017   TSH 0.981 10/28/2010   INR 1.0 RATIO 10/27/2007   HGBA1C 8.0 (A) 09/28/2017   MICROALBUR 0.67 01/02/2011    Assessment/Plan:  Candida vaginitis  - Plan: fluconazole (DIFLUCAN) 150 MG tablet  Other viral warts -see  procedure note below -given aftercare instructions -may need additional treatment given size  Skin lesion of chest wall -cortisone cream -pt encouraged to schedule mammogram  Cryotherapy  Reason: wart Location: L thumb  Consent obtained.  R/b/a reviewed.  Liquid nitrogen was applied using the liquid nitrogen gun without difficulty. Tolerated well without complications.  After care reviewed.   F/u prn  Grier Mitts, MD

## 2017-11-04 ENCOUNTER — Other Ambulatory Visit: Payer: Self-pay | Admitting: Family Medicine

## 2017-11-04 ENCOUNTER — Telehealth: Payer: Self-pay | Admitting: Family Medicine

## 2017-11-04 DIAGNOSIS — Z1231 Encounter for screening mammogram for malignant neoplasm of breast: Secondary | ICD-10-CM

## 2017-11-04 NOTE — Telephone Encounter (Signed)
Copied from [place] [id number]. Topic: Quick Communication - See Telephone Encounter >> Nov 04, 2017  9:59 AM Rutherford Nail, NT wrote: CRM for notification. See Telephone encounter for: 11/04/17. Patient calling and states that she is needing an order for diagnostic mammogram to go to the breast center. Please advise. CB#: [phone]

## 2017-11-04 NOTE — Telephone Encounter (Signed)
Order for mammogram has been placed per pt request

## 2017-11-09 ENCOUNTER — Telehealth: Payer: Self-pay | Admitting: Family Medicine

## 2017-11-09 NOTE — Telephone Encounter (Signed)
I did not see from Dr Volanda Napoleon note any indication of diagnostic mammogram.  She made reference to pt scheduling mammogram.   We may have to wait until she gets back to get clarification.

## 2017-11-09 NOTE — Telephone Encounter (Signed)
I called the [facility] and spoke with [place].  She stated the pt called their office and stated she is having a problem.  She stated since this is a new problem an order for a diagnostic mammogram would need to be entered as they have only received an order for a screening test.  Message sent to Dr Elease Hashimoto as Dr Volanda Napoleon is out of the office.

## 2017-11-09 NOTE — Telephone Encounter (Unsigned)
Copied from [place] [phone]. Topic: Quick Communication - See Telephone Encounter >> Nov 09, 2017 11:10 AM Neva Seat wrote: [facility] never received the diagnostic breast exam order - mammogram. Please refax the order.

## 2017-11-09 NOTE — Telephone Encounter (Signed)
I called the [place] and informed [place] of this.  I also called the pt to inform her of this and a message was sent to Dr Volanda Napoleon to address when she returns to the office.

## 2017-11-15 ENCOUNTER — Other Ambulatory Visit: Payer: Self-pay | Admitting: Family Medicine

## 2017-11-15 DIAGNOSIS — L819 Disorder of pigmentation, unspecified: Secondary | ICD-10-CM

## 2017-11-15 NOTE — Telephone Encounter (Signed)
Ok to place order for diagnostic mammogram.  Pt does not have any masses.  Has a hyperpigmented circular lesion on her R breast.

## 2017-11-15 NOTE — Telephone Encounter (Signed)
Order for Diagnostic mammogram has been placed, pt is aware

## 2017-11-20 ENCOUNTER — Other Ambulatory Visit: Payer: Self-pay | Admitting: Family Medicine

## 2017-11-20 DIAGNOSIS — L819 Disorder of pigmentation, unspecified: Secondary | ICD-10-CM

## 2017-11-29 ENCOUNTER — Other Ambulatory Visit: Payer: Self-pay | Admitting: Family Medicine

## 2017-11-29 ENCOUNTER — Other Ambulatory Visit: Payer: Self-pay

## 2017-11-29 DIAGNOSIS — L819 Disorder of pigmentation, unspecified: Secondary | ICD-10-CM

## 2017-12-01 ENCOUNTER — Ambulatory Visit: Payer: BC Managed Care – PPO

## 2017-12-01 ENCOUNTER — Ambulatory Visit
Admission: RE | Admit: 2017-12-01 | Discharge: 2017-12-01 | Disposition: A | Discharge status: Home / Self Care | Payer: BC Managed Care – PPO | Source: Ambulatory Visit | Attending: Family Medicine | Admitting: Family Medicine

## 2017-12-01 DIAGNOSIS — L819 Disorder of pigmentation, unspecified: Secondary | ICD-10-CM

## 2017-12-07 DIAGNOSIS — I10 Essential (primary) hypertension: Secondary | ICD-10-CM | POA: Diagnosis not present

## 2017-12-07 DIAGNOSIS — M4712 Other spondylosis with myelopathy, cervical region: Secondary | ICD-10-CM | POA: Diagnosis not present

## 2017-12-07 DIAGNOSIS — Z6836 Body mass index (BMI) 36.0-36.9, adult: Secondary | ICD-10-CM | POA: Diagnosis not present

## 2017-12-07 DIAGNOSIS — M4802 Spinal stenosis, cervical region: Secondary | ICD-10-CM | POA: Diagnosis not present

## 2017-12-17 ENCOUNTER — Other Ambulatory Visit: Payer: Self-pay | Admitting: Neurosurgery

## 2017-12-17 DIAGNOSIS — M4712 Other spondylosis with myelopathy, cervical region: Secondary | ICD-10-CM

## 2017-12-28 ENCOUNTER — Other Ambulatory Visit: Payer: Self-pay

## 2017-12-28 ENCOUNTER — Ambulatory Visit: Payer: Medicare Other | Attending: Neurosurgery | Admitting: Physical Therapy

## 2017-12-28 DIAGNOSIS — M542 Cervicalgia: Secondary | ICD-10-CM | POA: Diagnosis not present

## 2017-12-28 DIAGNOSIS — R2689 Other abnormalities of gait and mobility: Secondary | ICD-10-CM | POA: Diagnosis not present

## 2017-12-28 DIAGNOSIS — M6281 Muscle weakness (generalized): Secondary | ICD-10-CM | POA: Diagnosis not present

## 2017-12-29 ENCOUNTER — Encounter: Payer: Self-pay | Admitting: Physical Therapy

## 2017-12-29 NOTE — Therapy (Signed)
[place], [name], [address] Phone: [phone]   Fax:  [phone]  Physical Therapy Evaluation  Patient Details  Name: [name] Bean MRN: [id number] Date of Birth: December 11, 1950 Referring Provider: Dr. Ashok Pall    Encounter Date: 12/28/2017  PT End of Session - 12/28/17 1541    Visit Number  1    Number of Visits  12    PT Start Time  1500    PT Stop Time  1545    PT Time Calculation (min)  45 min    Activity Tolerance  Patient tolerated treatment well    Behavior During Therapy  [facility] for tasks assessed/performed       Past Medical History:  Diagnosis Date  . Anemia   . Anxiety   . Arthritis   . Asthma   . Broken arm    left arm  . Bronchitis   . Bursitis of left hip   . CAD (coronary artery disease)   . Candidiasis, vagina   . Cardiac arrest ([place]) 06/20/2016   at [facility]; ? due to flash pulm edema vs undertreated chronic CHF  . Cerumen impaction   . CHF (congestive heart failure) ([place])    RECENT ADMIT TO [facility]   . Colon, diverticulosis   . Depression   . Diabetes mellitus    15 YRS AGO  . Fatigue   . Gastroenteritis   . Hot flashes, menopausal   . Hyperlipidemia   . Knee pain, left   . Lumbar back pain   . Maxillary sinusitis    history  . Muscle tear    right gluteus  . Nausea   . OSA (obstructive sleep apnea) 10/16/2017  . Other B-complex deficiencies   . Otitis media, acute    left  . Peripheral neuropathy   . Spinal stenosis of lumbar region   . Tubulovillous adenoma of colon   . Unspecified essential hypertension     Past Surgical History:  Procedure Laterality Date  . CARDIAC CATHETERIZATION  2015, 2009, 2006  . CERVICAL SPINE SURGERY  2003  . CORONARY ANGIOPLASTY WITH STENT PLACEMENT  2002, 2015  . EYE SURGERY     cataracts bilaterally  . POSTERIOR CERVICAL LAMINECTOMY N/A 10/23/2016   Procedure: POSTERIOR CERVICAL LAMINECTOMY MULTI LEVEL CERVICAL TWO-  CERVICAL THREE, CERVICAL THREE- CERVICAL FOUR;  Surgeon: Ashok Pall, MD;  Location: [facility];  Service: Neurosurgery;  Laterality: N/A;  POSTERIOR    There were no vitals filed for this visit.   Subjective Assessment - 12/28/17 1511    Subjective  Pt is familiar to me from previous episode.  Patient went to MD for LE muscle spasms.  The doctor thinks it is more stenosis in my neck. Pt went to the MD when she had inability to walk and get out of bed. She has difficulty performing ADLs, and often cannot often walk./move fast enough to make it to the bathroom.  Signed up for senior center and wants to improve her mobility.  She is having MRI next week to detemine if she needs further surgery.      Pertinent History  pulmonary , CHF, diabetes, cervical myelopathy with surgery (laminectomy c2-C3-C4) 10/2016.  History of cardiac cath, stent . hip bursitis, sleep apnea. Mother recently died    Limitations  Sitting;Lifting;Walking;House hold activities;Standing    Diagnostic tests  See chart for MRI 05/2017 showed posterior ligament ossification     Patient Stated Goals  Gain better  movement.      Currently in Pain?  Yes    Pain Score  4     Pain Location  Hip    Pain Orientation  Right    Pain Descriptors / Indicators  Sore    Pain Type  Chronic pain    Pain Radiating Towards  Rt leg /thigh     Pain Onset  More than a month ago    Pain Frequency  Intermittent    Aggravating Factors   walking, getting up from sitting     Pain Relieving Factors  rest, heat     Multiple Pain Sites  Yes    Pain Score  0   can be moderate at times    Pain Location  Neck    Pain Orientation  Right;Left    Pain Descriptors / Indicators  Aching    Pain Type  Chronic pain    Pain Radiating Towards  across both shoulders     Pain Onset  More than a month ago    Pain Frequency  Intermittent    Aggravating Factors   overactivity, reaching     Pain Relieving Factors  rest, heat     Effect of Pain on Daily Activities   hard tol lift L >R UE          [facility] PT Assessment - 12/29/17 0001      Assessment   Medical Diagnosis  cervical spondylosis with myelopathy    Referring Provider  Dr. Ashok Pall     Onset Date/Surgical Date  --   early Aug. 2019 worsened   Hand Dominance  Right    Prior Therapy  Yes       Precautions   Precautions  Fall      Restrictions   Weight Bearing Restrictions  No      Balance Screen   Has the patient fallen in the past 6 months  Yes    How many times?  1    Has the patient had a decrease in activity level because of a fear of falling?   Yes    Is the patient reluctant to leave their home because of a fear of falling?   No      Home Environment   Living Environment  Private residence    Living Arrangements  Alone    Type of [place] Access  Level entry    Home Layout  One level      Prior Function   Level of Independence  Independent;Independent with household mobility with device;Independent with community mobility with device    Vocation  Retired    Leisure  friends, family, travel      Cognition   Overall Cognitive Status  Within Functional Limits for tasks assessed      Observation/Other Assessments   Focus on Therapeutic Outcomes (FOTO)   45%      Sensation   Light Touch  Impaired by gross assessment    Additional Comments  Rt LE hyposensitive      Posture/Postural Control   Posture/Postural Control  Postural limitations    Postural Limitations  Rounded Shoulders;Forward head;Decreased thoracic kyphosis;Flexed trunk    Posture Comments  wide BOS      AROM   Right Shoulder Flexion  112 Degrees    Right Shoulder ABduction  110 Degrees    Left Shoulder Flexion  75 Degrees    Left Shoulder ABduction  70 Degrees  Cervical Flexion  38    Cervical Extension  12    Cervical - Right Side Bend  20    Cervical - Left Side Bend  15    Cervical - Right Rotation  38    Cervical - Left Rotation  30      Strength   Right Shoulder  Flexion  4/5    Right Shoulder ABduction  4/5    Left Shoulder Flexion  4/5    Left Shoulder ABduction  4/5    Right Hip Flexion  4/5    Left Hip Flexion  4+/5    Right Knee Flexion  5/5    Right Knee Extension  5/5    Left Knee Flexion  5/5    Left Knee Extension  5/5    Right Ankle Dorsiflexion  4/5    Left Ankle Dorsiflexion  4/5      Palpation   Palpation comment  not tender in neck, shoulders.  skin tightly stretched, fascial restrictions      Transfers   Transfers  Sit to Stand;Stand Pivot Transfers    Sit to Stand  6: Modified independent (Device/Increase time)    Stand Pivot Transfers  6: Modified independent (Device/Increase time)    Comments  stiffness      Ambulation/Gait   Ambulation Distance (Feet)  150 Feet    Assistive device  Small based quad cane    Gait Pattern  Decreased arm swing - right;Decreased arm swing - left;Decreased step length - right;Decreased step length - left;Decreased hip/knee flexion - right;Decreased hip/knee flexion - left;Ataxic;Wide base of support                Objective measurements completed on examination: See above findings.              PT Education - 12/29/17 0830    Education Details  PT/POC    Person(s) Educated  Patient    Methods  Explanation    Comprehension  Verbalized understanding          PT Long Term Goals - 12/29/17 0830      PT LONG TERM GOAL #1   Title  Pt will be able to walk in the community as needed (with cart) and decreased Rt LE spasms, pain for shopping.     Time  6    Period  Weeks    Status  New    Target Date  02/08/18      PT LONG TERM GOAL #2   Title  Pt will safely negotiate 12 or more stairs in the community with confidence and min to no fatigue, pain in hips.     Time  6    Period  Weeks    Status  New    Target Date  02/08/18      PT LONG TERM GOAL #3   Title  Pt will be able to be I with HEP for cervical ROM, UE and LE strength    Time  6    Period  Weeks     Status  New    Target Date  02/08/18      PT LONG TERM GOAL #4   Title  Pt will increase cervical AROM by 10 or more degrees in each direction, no lasting pain for improved driving, travel.     Time  6    Period  Weeks    Status  New    Target Date  02/08/18  PT LONG TERM GOAL #5   Title  Pt will be able to use her LUE for a small portion (25%) of her ADLs due to increased ROM and less pain.     Time  6    Period  Weeks    Status  New    Target Date  02/08/18      PT LONG TERM GOAL #6   Title  Pt will transition to a community based fitness class/group [facility]    Time  6    Period  Weeks    Status  New    Target Date  02/08/18      PT LONG TERM GOAL #7   Title  Pt will get up from the floor unassisted as needed for safety with travel and at home.     Time  6    Period  Weeks    Status  New    Target Date  02/08/18             Plan - 12/29/17 0834    Clinical Impression Statement  Pt presents for high complexity eval of cervical spondylosis with myelopathy.  She was last seen in Jan 2019, she completed 27 visits and at that time was doing pulmonary rehab. She has developed new symptoms of Rt LE pain and muscle, increased tone in Rt LE. She will likely gain only minimal improvement due to the extent of her injury and possible progression of symptoms. She realizes this but would like to try Pilates and other methods to gain as much function as she can.      History and Personal Factors relevant to plan of care:  multiple comorbidities    Clinical Presentation  Unstable    Clinical Presentation due to:  high fall risk.  Spinal canal stenosis affecting gait. New symptoms of Rt LE weakness, spasm, pain, tone.     Clinical Decision Making  High    Rehab Potential  Good    PT Frequency  2x / week    PT Duration  6 weeks    PT Treatment/Interventions  ADLs/Self Care Home Management;Functional mobility training;Neuromuscular re-education;Passive range of  motion;Manual techniques;Moist Heat;Gait training;DME Instruction;Therapeutic activities;Therapeutic exercise;Patient/family education;Cryotherapy;Ambulance person    PT Next Visit Plan  develop HEP, balance and mobiity screen, set goal if appropriate     PT Home Exercise Plan  none current, time constraints day of eval     Consulted and Agree with Plan of Care  Patient       Patient will benefit from skilled therapeutic intervention in order to improve the following deficits and impairments:  Abnormal gait, Decreased balance, Decreased endurance, Decreased mobility, Difficulty walking, Hypomobility, Increased muscle spasms, Impaired sensation, Obesity, Improper body mechanics, Impaired tone, Decreased range of motion, Cardiopulmonary status limiting activity, Decreased activity tolerance, Decreased safety awareness, Decreased strength, Increased fascial restricitons, Impaired flexibility, Postural dysfunction, Pain  Visit Diagnosis: Other abnormalities of gait and mobility  Cervicalgia  Muscle weakness (generalized)     Problem List Patient Active Problem List   Diagnosis Date Noted  . OSA (obstructive sleep apnea) 10/16/2017  . Cervical stenosis of spinal canal 10/23/2016  . Neck swelling 09/28/2011  . Weight gain 01/02/2011  . Carpal tunnel syndrome 11/02/2010  . Leg swelling 08/26/2010  . ADRENAL MASS, LEFT 05/23/2010  . SPINAL STENOSIS, LUMBAR 12/27/2009  . B12 DEFICIENCY 06/10/2009  . ANEMIA 06/10/2009  . BACK PAIN, LUMBAR 11/06/2008  . KNEE PAIN, LEFT  02/03/2008  . DIABETES MELLITUS, TYPE II, UNCONTROLLED 01/12/2008  . MENOPAUSE-RELATED VASOMOTOR SYMPTOMS, HOT FLASHES 06/23/2007  . BURSITIS, LEFT HIP 06/23/2007  . HYPERLIPIDEMIA 03/25/2007  . ANXIETY 03/25/2007  . DEPRESSION 03/25/2007  . PERIPHERAL NEUROPATHY 03/25/2007  . HYPERTENSION 03/25/2007  . CORONARY ARTERY DISEASE 03/25/2007  . DIVERTICULOSIS, COLON 03/25/2007  .  TUBULOVILLOUS ADENOMA, COLON 02/18/2007    [name] Bean 12/29/2017, 12:45 PM  [facility] [address] [place], [name], [address] Phone: [phone]   Fax:  [phone]  Name: [name] Bean MRN: [id number] Date of Birth: Jan 11, 1951   Raeford Razor, PT 12/29/17 12:45 PM Phone: [phone] Fax: [phone]

## 2017-12-30 ENCOUNTER — Other Ambulatory Visit: Payer: Self-pay | Admitting: Family Medicine

## 2017-12-31 ENCOUNTER — Ambulatory Visit (INDEPENDENT_AMBULATORY_CARE_PROVIDER_SITE_OTHER): Payer: BC Managed Care – PPO | Admitting: Pulmonary Disease

## 2017-12-31 ENCOUNTER — Encounter: Payer: Self-pay | Admitting: Pulmonary Disease

## 2017-12-31 VITALS — BP 130/70 | HR 94 | Ht 62.0 in | Wt 198.0 lb

## 2017-12-31 DIAGNOSIS — E669 Obesity, unspecified: Secondary | ICD-10-CM

## 2017-12-31 DIAGNOSIS — G4730 Sleep apnea, unspecified: Secondary | ICD-10-CM | POA: Diagnosis not present

## 2017-12-31 DIAGNOSIS — G4733 Obstructive sleep apnea (adult) (pediatric): Secondary | ICD-10-CM

## 2017-12-31 NOTE — Patient Instructions (Signed)
Follow up in 1 year.

## 2017-12-31 NOTE — Progress Notes (Signed)
[facility]  Chief Complaint  Patient presents with  . Follow-up    Pt is doing well overall with cpap machine. Pt doesn't want to have flu shot today, will wait in Oct 2019    Constitutional: BP 130/70 (BP Location: Left Arm, Cuff Size: Normal)   Pulse 94   Ht '5\' 2"'$  (1.575 m)   Wt 198 lb (89.8 kg)   SpO2 98%   BMI 36.21 kg/m   History of Present Illness: [name] Bean is a 67 y.o. female with OSA.  She is doing well with CPAP.  No issues with mask fit or pressure.  Denies sinus congestion, sore throat, dry mouth, or aerophagia.  Still naps and doesn't use CPAP then.  Also sleeps for extra hour in the morning w/o CPAP.  Has trouble with weight.  Has been eating fast food more.  Can't exercise due to spinal stenosis.   Comprehensive Respiratory Exam:  Appearance - well kempt  ENMT - nasal mucosa moist, turbinates clear, midline nasal septum, no dental lesions, no gingival bleeding, no oral exudates, no tonsillar hypertrophy Neck - no masses, trachea midline, no thyromegaly, no elevation in JVP Respiratory - normal appearance of chest wall, normal respiratory effort w/o accessory muscle use, no dullness on percussion, no wheezing or rales CV - s1s2 regular rate and rhythm, no murmurs, no peripheral edema, radial pulses symmetric GI - soft, non tender Lymph - no adenopathy noted in neck and axillary areas MSK - normal muscle strength and tone, walks with a cane Ext - no cyanosis, clubbing, or joint inflammation noted Skin - no rashes, lesions, or ulcers Neuro - oriented to person, place, and time Psych - normal mood and affect   Assessment/Plan:  Obstructive sleep apnea. - she is compliant with CPAP and reports benefit from therapy - continue auto CPAP 5 - 20 cm H2O - discussed options to assist with mask cleaning  Obesity. - reviewed options to assist with diet modification   Patient Instructions  Follow up in 1  year    Chesley Mires, MD [place] 12/31/2017, 11:56 AM  Flow Sheet  Sleep tests: PSG 10/13/17 >> AHI 35.9, SpO2 low 88%. Auto CPAP 10/31/17 to 12/29/17 >> used on 58 of 60 nights with average 6 hrs 44 min.  Average AHI 3.1 with median CPAP 15 and 95 th percentile CPAP 19 cm H2O.  Past Medical History: She  has a past medical history of Anemia, Anxiety, Arthritis, Asthma, Broken arm, Bronchitis, Bursitis of left hip, CAD (coronary artery disease), Candidiasis, vagina, Cardiac arrest ([place]) (06/20/2016), Cerumen impaction, CHF (congestive heart failure) ([place]), Colon, diverticulosis, Depression, Diabetes mellitus, Fatigue, Gastroenteritis, Hot flashes, menopausal, Hyperlipidemia, Knee pain, left, Lumbar back pain, Maxillary sinusitis, Muscle tear, Nausea, OSA (obstructive sleep apnea) (10/16/2017), Other B-complex deficiencies, Otitis media, acute, Peripheral neuropathy, Spinal stenosis of lumbar region, Tubulovillous adenoma of colon, and Unspecified essential hypertension.  Past Surgical History: She  has a past surgical history that includes Cervical spine surgery (2003); Eye surgery; Cardiac catheterization (2015, 2009, 2006); Coronary angioplasty with stent (2002, 2015); and Posterior cervical laminectomy (N/A, 10/23/2016).  Family History: Her family history includes Alcohol abuse in her father; Diabetes in her mother; Hypertension in her mother; Pneumonia in her father.  Social History: She  reports that she has been smoking cigarettes. She has a 11.50 pack-year smoking history. She has never used smokeless tobacco. She reports that she drinks alcohol. She reports that she has current or  past drug history. Drug: Marijuana.  Medications: Allergies as of 12/31/2017      Reactions   Sitagliptin Other (See Comments)   Had pancreatitis while on Januvia       Medication List        Accurate as of 12/31/17 11:56 AM. Always use your most recent med list.           ACCU-CHEK COMPACT CARE KIT Kit Accu-Chek Compact Plus Care kit  USE AS INSTRUCTED.   aspirin EC 81 MG tablet Take 81 mg by mouth daily.   BASAGLAR KWIKPEN 100 UNIT/ML Sopn INJECT 32 UNITS SUBCUTANEOUSLY NIGHTLY   BD PEN NEEDLE NANO U/F 32G X 4 MM Misc Generic drug:  Insulin Pen Needle USE TO INJECT INSULIN 5 TIMES A DAY   carvedilol 25 MG tablet Commonly known as:  COREG Take 1 tablet (25 mg total) by mouth 2 (two) times daily with a meal.   glucose blood test strip 1 each by Other route daily. (accu-chek guide) DX E11.9   hydrALAZINE 50 MG tablet Commonly known as:  APRESOLINE TAKE 1 AND 1/2 TABLETS BY MOUTH EVERY 12 HOURS   insulin aspart 100 UNIT/ML FlexPen Commonly known as:  NOVOLOG INJECT 12 UNITS SUBCUTANEOUSLY 3 (THREE) TIMES DAILY WITH MEALS   latanoprost 0.005 % ophthalmic solution Commonly known as:  XALATAN Place 1 drop into both eyes at bedtime.   losartan 100 MG tablet Commonly known as:  COZAAR Take 100 mg by mouth daily.   LUMIGAN 0.01 % Soln Generic drug:  bimatoprost Lumigan 0.01 % eye drops  PUT 1 DROP INTO BOTH EYES AT BEDTIME   rosuvastatin 20 MG tablet Commonly known as:  CRESTOR TAKE 1 TABLET BY MOUTH EVERY DAY   sertraline 50 MG tablet Commonly known as:  ZOLOFT Take 1 tablet (50 mg total) by mouth daily.   spironolactone 25 MG tablet Commonly known as:  ALDACTONE Take 1 tablet (25 mg total) by mouth daily.   tiZANidine 2 MG tablet Commonly known as:  ZANAFLEX Take by mouth every 6 (six) hours as needed for muscle spasms.   torsemide 20 MG tablet Commonly known as:  DEMADEX Take 20-40 mg by mouth See admin instructions. Take 20 mg by mouth daily and may take 20-40 mg by mouth as needed for swelling   varenicline 1 MG tablet Commonly known as:  CHANTIX Take 1 tablet (1 mg total) by mouth 2 (two) times daily.

## 2018-01-04 ENCOUNTER — Ambulatory Visit
Admission: RE | Admit: 2018-01-04 | Discharge: 2018-01-04 | Disposition: A | Discharge status: Home / Self Care | Payer: Medicare Other | Source: Ambulatory Visit | Attending: Neurosurgery | Admitting: Neurosurgery

## 2018-01-04 DIAGNOSIS — M4322 Fusion of spine, cervical region: Secondary | ICD-10-CM | POA: Diagnosis not present

## 2018-01-04 DIAGNOSIS — M4712 Other spondylosis with myelopathy, cervical region: Secondary | ICD-10-CM

## 2018-01-11 ENCOUNTER — Ambulatory Visit: Payer: Medicare Other | Admitting: Physical Therapy

## 2018-01-13 ENCOUNTER — Ambulatory Visit: Payer: Medicare Other | Attending: Neurosurgery | Admitting: Physical Therapy

## 2018-01-13 ENCOUNTER — Encounter: Payer: Self-pay | Admitting: Physical Therapy

## 2018-01-13 DIAGNOSIS — M6281 Muscle weakness (generalized): Secondary | ICD-10-CM | POA: Diagnosis not present

## 2018-01-13 DIAGNOSIS — R2689 Other abnormalities of gait and mobility: Secondary | ICD-10-CM | POA: Insufficient documentation

## 2018-01-13 DIAGNOSIS — M542 Cervicalgia: Secondary | ICD-10-CM | POA: Diagnosis not present

## 2018-01-13 NOTE — Therapy (Signed)
[place], [name], [address] Phone: [phone]   Fax:  [phone]  Physical Therapy Treatment  Patient Details  Name: [name] Bean MRN: [id number] Date of Birth: December 30, 1950 Referring Provider (PT): Dr. Ashok Pall    Encounter Date: 01/13/2018  PT End of Session - 01/13/18 1414    Visit Number  2    Number of Visits  12    PT Start Time  3536   short session, patient late   PT Stop Time  1413    PT Time Calculation (min)  25 min    Activity Tolerance  Patient tolerated treatment well    Behavior During Therapy  [facility] for tasks assessed/performed       Past Medical History:  Diagnosis Date  . Anemia   . Anxiety   . Arthritis   . Asthma   . Broken arm    left arm  . Bronchitis   . Bursitis of left hip   . CAD (coronary artery disease)   . Candidiasis, vagina   . Cardiac arrest ([place]) 06/20/2016   at [facility]; ? due to flash pulm edema vs undertreated chronic CHF  . Cerumen impaction   . CHF (congestive heart failure) ([place])    RECENT ADMIT TO [facility]   . Colon, diverticulosis   . Depression   . Diabetes mellitus    15 YRS AGO  . Fatigue   . Gastroenteritis   . Hot flashes, menopausal   . Hyperlipidemia   . Knee pain, left   . Lumbar back pain   . Maxillary sinusitis    history  . Muscle tear    right gluteus  . Nausea   . OSA (obstructive sleep apnea) 10/16/2017  . Other B-complex deficiencies   . Otitis media, acute    left  . Peripheral neuropathy   . Spinal stenosis of lumbar region   . Tubulovillous adenoma of colon   . Unspecified essential hypertension     Past Surgical History:  Procedure Laterality Date  . CARDIAC CATHETERIZATION  2015, 2009, 2006  . CERVICAL SPINE SURGERY  2003  . CORONARY ANGIOPLASTY WITH STENT PLACEMENT  2002, 2015  . EYE SURGERY     cataracts bilaterally  . POSTERIOR CERVICAL LAMINECTOMY N/A 10/23/2016   Procedure: POSTERIOR CERVICAL  LAMINECTOMY MULTI LEVEL CERVICAL TWO- CERVICAL THREE, CERVICAL THREE- CERVICAL FOUR;  Surgeon: Ashok Pall, MD;  Location: [facility];  Service: Neurosurgery;  Laterality: N/A;  POSTERIOR    There were no vitals filed for this visit.  Subjective Assessment - 01/13/18 1352    Subjective  No pain . I took a muscle relaxer earlier and then I took a nap.  (Pended)                        [facility] Adult PT Treatment/Exercise - 01/13/18 0001      Knee/Hip Exercises: Stretches   Passive Hamstring Stretch  3 reps;20 seconds;Both    Piriformis Stretch  3 reps;20 seconds    Other Knee/Hip Stretches  inner thigh stretch 2 X 20 feet together    Other Knee/Hip Stretches  HIP IR/ER both 20 X 3 each      Knee/Hip Exercises: Aerobic   Nustep  5 minutes             PT Education - 01/13/18 1414    Education Details  exercise form    Methods  Explanation;Demonstration  Comprehension  Verbalized understanding          PT Long Term Goals - 12/29/17 0830      PT LONG TERM GOAL #1   Title  Pt will be able to walk in the community as needed (with cart) and decreased Rt LE spasms, pain for shopping.     Time  6    Period  Weeks    Status  New    Target Date  02/08/18      PT LONG TERM GOAL #2   Title  Pt will safely negotiate 12 or more stairs in the community with confidence and min to no fatigue, pain in hips.     Time  6    Period  Weeks    Status  New    Target Date  02/08/18      PT LONG TERM GOAL #3   Title  Pt will be able to be I with HEP for cervical ROM, UE and LE strength    Time  6    Period  Weeks    Status  New    Target Date  02/08/18      PT LONG TERM GOAL #4   Title  Pt will increase cervical AROM by 10 or more degrees in each direction, no lasting pain for improved driving, travel.     Time  6    Period  Weeks    Status  New    Target Date  02/08/18      PT LONG TERM GOAL #5   Title  Pt will be able to use her LUE for a small portion (25%) of  her ADLs due to increased ROM and less pain.     Time  6    Period  Weeks    Status  New    Target Date  02/08/18      PT LONG TERM GOAL #6   Title  Pt will transition to a community based [facility] class[facility]    Time  6    Period  Weeks    Status  New    Target Date  02/08/18      PT LONG TERM GOAL #7   Title  Pt will get up from the floor unassisted as needed for safety with travel and at home.     Time  6    Period  Weeks    Status  New    Target Date  02/08/18            Plan - 01/13/18 1415    Clinical Impression Statement  Stretchin main focus today ,  session short due t patient late.  No pain today.    PT Next Visit Plan  develop HEP, balance and mobiity screen, set goal if appropriate     PT Home Exercise Plan  none current, time constraints day of eval     Consulted and Agree with Plan of Care  Patient       Patient will benefit from skilled therapeutic intervention in order to improve the following deficits and impairments:     Visit Diagnosis: Other abnormalities of gait and mobility  Cervicalgia  Muscle weakness (generalized)     Problem List Patient Active Problem List   Diagnosis Date Noted  . OSA (obstructive sleep apnea) 10/16/2017  . Cervical stenosis of spinal canal 10/23/2016  . Neck swelling 09/28/2011  . Weight gain 01/02/2011  . Carpal tunnel syndrome  11/02/2010  . Leg swelling 08/26/2010  . ADRENAL MASS, LEFT 05/23/2010  . SPINAL STENOSIS, LUMBAR 12/27/2009  . B12 DEFICIENCY 06/10/2009  . ANEMIA 06/10/2009  . BACK PAIN, LUMBAR 11/06/2008  . KNEE PAIN, LEFT 02/03/2008  . DIABETES MELLITUS, TYPE II, UNCONTROLLED 01/12/2008  . MENOPAUSE-RELATED VASOMOTOR SYMPTOMS, HOT FLASHES 06/23/2007  . BURSITIS, LEFT HIP 06/23/2007  . HYPERLIPIDEMIA 03/25/2007  . ANXIETY 03/25/2007  . DEPRESSION 03/25/2007  . PERIPHERAL NEUROPATHY 03/25/2007  . HYPERTENSION 03/25/2007  . CORONARY ARTERY DISEASE 03/25/2007  .  DIVERTICULOSIS, COLON 03/25/2007  . TUBULOVILLOUS ADENOMA, COLON 02/18/2007    Rochelle Larue PTA 01/13/2018, 2:16 PM  [facility] [address] [place], [name], [address] Phone: [phone]   Fax:  [phone]  Name: [name] Bean MRN: [id number] Date of Birth: [birth year]

## 2018-01-18 ENCOUNTER — Ambulatory Visit: Payer: Medicare Other | Admitting: Physical Therapy

## 2018-01-18 DIAGNOSIS — M6281 Muscle weakness (generalized): Secondary | ICD-10-CM | POA: Diagnosis not present

## 2018-01-18 DIAGNOSIS — R2689 Other abnormalities of gait and mobility: Secondary | ICD-10-CM

## 2018-01-18 DIAGNOSIS — M542 Cervicalgia: Secondary | ICD-10-CM

## 2018-01-18 NOTE — Therapy (Signed)
[place], [name], [address] Phone: [phone]   Fax:  [phone]  Physical Therapy Treatment  Patient Details  Name: [name] Bean MRN: [id number] Date of Birth: 05/05/1950 Referring Provider (PT): Dr. Ashok Pall    Encounter Date: 01/18/2018  PT End of Session - 01/18/18 1451    Visit Number  3    Number of Visits  12    Date for PT Re-Evaluation  02/08/18    PT Start Time  0215    PT Stop Time  0255    PT Time Calculation (min)  40 min       Past Medical History:  Diagnosis Date  . Anemia   . Anxiety   . Arthritis   . Asthma   . Broken arm    left arm  . Bronchitis   . Bursitis of left hip   . CAD (coronary artery disease)   . Candidiasis, vagina   . Cardiac arrest ([place]) 06/20/2016   at [facility]; ? due to flash pulm edema vs undertreated chronic CHF  . Cerumen impaction   . CHF (congestive heart failure) ([place])    RECENT ADMIT TO [facility]   . Colon, diverticulosis   . Depression   . Diabetes mellitus    15 YRS AGO  . Fatigue   . Gastroenteritis   . Hot flashes, menopausal   . Hyperlipidemia   . Knee pain, left   . Lumbar back pain   . Maxillary sinusitis    history  . Muscle tear    right gluteus  . Nausea   . OSA (obstructive sleep apnea) 10/16/2017  . Other B-complex deficiencies   . Otitis media, acute    left  . Peripheral neuropathy   . Spinal stenosis of lumbar region   . Tubulovillous adenoma of colon   . Unspecified essential hypertension     Past Surgical History:  Procedure Laterality Date  . CARDIAC CATHETERIZATION  2015, 2009, 2006  . CERVICAL SPINE SURGERY  2003  . CORONARY ANGIOPLASTY WITH STENT PLACEMENT  2002, 2015  . EYE SURGERY     cataracts bilaterally  . POSTERIOR CERVICAL LAMINECTOMY N/A 10/23/2016   Procedure: POSTERIOR CERVICAL LAMINECTOMY MULTI LEVEL CERVICAL TWO- CERVICAL THREE, CERVICAL THREE- CERVICAL FOUR;  Surgeon: Ashok Pall, MD;   Location: [place];  Service: Neurosurgery;  Laterality: N/A;  POSTERIOR    There were no vitals filed for this visit.      [facility] PT Assessment - 01/18/18 0001      Standardized Balance Assessment   Standardized Balance Assessment  Berg Balance Test      Berg Balance Test   Sit to Stand  Able to stand without using hands and stabilize independently    Standing Unsupported  Able to stand safely 2 minutes    Sitting with Back Unsupported but Feet Supported on Floor or Stool  Able to sit safely and securely 2 minutes    Stand to Sit  Sits safely with minimal use of hands    Transfers  Able to transfer safely, minor use of hands    Standing Unsupported with Eyes Closed  Able to stand 10 seconds safely    Standing Ubsupported with Feet Together  Able to place feet together independently and stand 1 minute safely    From Standing, Reach Forward with Outstretched Arm  Can reach confidently >25 cm (10")    From Standing Position, Pick up Object from Floor  Able to pick up shoe safely and easily    From Standing Position, Turn to Look Behind Over each Shoulder  Looks behind from both sides and weight shifts well    Turn 360 Degrees  Able to turn 360 degrees safely but slowly    Standing Unsupported, Alternately Place Feet on Step/Stool  Able to stand independently and safely and complete 8 steps in 20 seconds    Standing Unsupported, One Foot in Front  Able to plae foot ahead of the other independently and hold 30 seconds   22 sec tandem   Standing on One Leg  Able to lift leg independently and hold equal to or more than 3 seconds    Total Score  51                   [facility] Adult PT Treatment/Exercise - 01/18/18 0001      Knee/Hip Exercises: Stretches   Piriformis Stretch  3 reps;20 seconds    Other Knee/Hip Stretches  knee to chest     Other Knee/Hip Stretches  gastroc stretch 3 x 20 sec       Knee/Hip Exercises: Standing   Other Standing Knee Exercises  SLS and tandem trials  at counter top                  PT Long Term Goals - 12/29/17 0830      PT LONG TERM GOAL #1   Title  Pt will be able to walk in the community as needed (with cart) and decreased Rt LE spasms, pain for shopping.     Time  6    Period  Weeks    Status  New    Target Date  02/08/18      PT LONG TERM GOAL #2   Title  Pt will safely negotiate 12 or more stairs in the community with confidence and min to no fatigue, pain in hips.     Time  6    Period  Weeks    Status  New    Target Date  02/08/18      PT LONG TERM GOAL #3   Title  Pt will be able to be I with HEP for cervical ROM, UE and LE strength    Time  6    Period  Weeks    Status  New    Target Date  02/08/18      PT LONG TERM GOAL #4   Title  Pt will increase cervical AROM by 10 or more degrees in each direction, no lasting pain for improved driving, travel.     Time  6    Period  Weeks    Status  New    Target Date  02/08/18      PT LONG TERM GOAL #5   Title  Pt will be able to use her LUE for a small portion (25%) of her ADLs due to increased ROM and less pain.     Time  6    Period  Weeks    Status  New    Target Date  02/08/18      PT LONG TERM GOAL #6   Title  Pt will [facility]    Time  6    Period  Weeks    Status  New    Target Date  02/08/18      PT LONG TERM GOAL #  7   Title  Pt will get up from the floor unassisted as needed for safety with travel and at home.     Time  6    Period  Weeks    Status  New    Target Date  02/08/18            Plan - 01/18/18 1451    Clinical Impression Statement  BERG 51/56. Began static balance, stretches for gastroc and hips.     PT Next Visit Plan  develop HEP, balance and mobiity screen, set goal if appropriate     PT Home Exercise Plan  none current, time constraints day of eval     Consulted and Agree with Plan of Care  Patient       Patient will benefit from skilled  therapeutic intervention in order to improve the following deficits and impairments:  Abnormal gait, Decreased balance, Decreased endurance, Decreased mobility, Difficulty walking, Hypomobility, Increased muscle spasms, Impaired sensation, Obesity, Improper body mechanics, Impaired tone, Decreased range of motion, Cardiopulmonary status limiting activity, Decreased activity tolerance, Decreased safety awareness, Decreased strength, Increased fascial restricitons, Impaired flexibility, Postural dysfunction, Pain  Visit Diagnosis: Other abnormalities of gait and mobility  Cervicalgia  Muscle weakness (generalized)     Problem List Patient Active Problem List   Diagnosis Date Noted  . OSA (obstructive sleep apnea) 10/16/2017  . Cervical stenosis of spinal canal 10/23/2016  . Neck swelling 09/28/2011  . Weight gain 01/02/2011  . Carpal tunnel syndrome 11/02/2010  . Leg swelling 08/26/2010  . ADRENAL MASS, LEFT 05/23/2010  . SPINAL STENOSIS, LUMBAR 12/27/2009  . B12 DEFICIENCY 06/10/2009  . ANEMIA 06/10/2009  . BACK PAIN, LUMBAR 11/06/2008  . KNEE PAIN, LEFT 02/03/2008  . DIABETES MELLITUS, TYPE II, UNCONTROLLED 01/12/2008  . MENOPAUSE-RELATED VASOMOTOR SYMPTOMS, HOT FLASHES 06/23/2007  . BURSITIS, LEFT HIP 06/23/2007  . HYPERLIPIDEMIA 03/25/2007  . ANXIETY 03/25/2007  . DEPRESSION 03/25/2007  . PERIPHERAL NEUROPATHY 03/25/2007  . HYPERTENSION 03/25/2007  . CORONARY ARTERY DISEASE 03/25/2007  . DIVERTICULOSIS, COLON 03/25/2007  . TUBULOVILLOUS ADENOMA, COLON 02/18/2007    Dorene Ar, PTA 01/18/2018, 3:04 PM  [facility] [address] [place], [name], [address] Phone: [phone]   Fax:  [phone]  Name: Mckinze Poirier MRN: [id number] Date of Birth: Aug 27, 1950

## 2018-01-21 ENCOUNTER — Encounter: Payer: Self-pay | Admitting: Physical Therapy

## 2018-01-21 ENCOUNTER — Ambulatory Visit: Payer: Medicare Other | Admitting: Physical Therapy

## 2018-01-21 ENCOUNTER — Other Ambulatory Visit: Payer: Self-pay | Admitting: Family Medicine

## 2018-01-21 DIAGNOSIS — M542 Cervicalgia: Secondary | ICD-10-CM

## 2018-01-21 DIAGNOSIS — R2689 Other abnormalities of gait and mobility: Secondary | ICD-10-CM

## 2018-01-21 DIAGNOSIS — M6281 Muscle weakness (generalized): Secondary | ICD-10-CM | POA: Diagnosis not present

## 2018-01-21 NOTE — Therapy (Signed)
[place], [name], [address] Phone: [phone]   Fax:  [phone]  Physical Therapy Treatment  Patient Details  Name: [name] Bean MRN: [id number] Date of Birth: [birth year] Referring Provider (PT): Dr. Ashok Pall    Encounter Date: 01/21/2018  PT End of Session - 01/21/18 1214    Visit Number  4    Number of Visits  12    Date for PT Re-Evaluation  02/08/18    PT Start Time  1147    PT Stop Time  1225    PT Time Calculation (min)  38 min       Past Medical History:  Diagnosis Date  . Anemia   . Anxiety   . Arthritis   . Asthma   . Broken arm    left arm  . Bronchitis   . Bursitis of left hip   . CAD (coronary artery disease)   . Candidiasis, vagina   . Cardiac arrest ([place]) 06/20/2016   at [facility]; ? due to flash pulm edema vs undertreated chronic CHF  . Cerumen impaction   . CHF (congestive heart failure) ([place])    RECENT ADMIT TO [facility]   . Colon, diverticulosis   . Depression   . Diabetes mellitus    15 YRS AGO  . Fatigue   . Gastroenteritis   . Hot flashes, menopausal   . Hyperlipidemia   . Knee pain, left   . Lumbar back pain   . Maxillary sinusitis    history  . Muscle tear    right gluteus  . Nausea   . OSA (obstructive sleep apnea) 10/16/2017  . Other B-complex deficiencies   . Otitis media, acute    left  . Peripheral neuropathy   . Spinal stenosis of lumbar region   . Tubulovillous adenoma of colon   . Unspecified essential hypertension     Past Surgical History:  Procedure Laterality Date  . CARDIAC CATHETERIZATION  2015, 2009, 2006  . CERVICAL SPINE SURGERY  2003  . CORONARY ANGIOPLASTY WITH STENT PLACEMENT  2002, 2015  . EYE SURGERY     cataracts bilaterally  . POSTERIOR CERVICAL LAMINECTOMY N/A 10/23/2016   Procedure: POSTERIOR CERVICAL LAMINECTOMY MULTI LEVEL CERVICAL TWO- CERVICAL THREE, CERVICAL THREE- CERVICAL FOUR;  Surgeon: Ashok Pall, MD;   Location: [place];  Service: Neurosurgery;  Laterality: N/A;  POSTERIOR    There were no vitals filed for this visit.  Subjective Assessment - 01/21/18 1153    Subjective  It is not a good day. I am not walking good today.     Currently in Pain?  No/denies    Pain Score  3     Pain Location  Hip    Pain Orientation  Right    Pain Descriptors / Indicators  Sore    Aggravating Factors   walking, sit-stand, standing     Pain Relieving Factors  rest, heat     Pain Score  2    Pain Location  Neck    Pain Orientation  Right    Pain Descriptors / Indicators  Aching;Sore   stiff   Aggravating Factors   constant     Pain Relieving Factors  rest, heat                        [facility] Adult PT Treatment/Exercise - 01/21/18 0001      Knee/Hip Exercises: Stretches   Piriformis Stretch  3 reps;20 seconds   towel assist   Other Knee/Hip Stretches  gastroc stretch 3 x 20 sec       Knee/Hip Exercises: Aerobic   Nustep  5 minutes      Knee/Hip Exercises: Standing   Other Standing Knee Exercises  SLS and tandem trials at counter top      Knee/Hip Exercises: Seated   Other Seated Knee/Hip Exercises  ball squeeze  and red band clams       Knee/Hip Exercises: Supine   Bridges  10 reps    Bridges with [employer]  10 reps    Other Supine Knee/Hip Exercises  ball squeeze       Knee/Hip Exercises: Prone   Other Prone Exercises  IR AROM       Manual Therapy   Manual Therapy  Soft tissue mobilization    Soft tissue mobilization  massage roller to gluteals , PROM hip IR/ER,                   PT Long Term Goals - 12/29/17 0830      PT LONG TERM GOAL #1   Title  Pt will be able to walk in the community as needed (with cart) and decreased Rt LE spasms, pain for shopping.     Time  6    Period  Weeks    Status  New    Target Date  02/08/18      PT LONG TERM GOAL #2   Title  Pt will safely negotiate 12 or more stairs in the community with confidence and min to no  fatigue, pain in hips.     Time  6    Period  Weeks    Status  New    Target Date  02/08/18      PT LONG TERM GOAL #3   Title  Pt will be able to be I with HEP for cervical ROM, UE and LE strength    Time  6    Period  Weeks    Status  New    Target Date  02/08/18      PT LONG TERM GOAL #4   Title  Pt will increase cervical AROM by 10 or more degrees in each direction, no lasting pain for improved driving, travel.     Time  6    Period  Weeks    Status  New    Target Date  02/08/18      PT LONG TERM GOAL #5   Title  Pt will be able to use her LUE for a small portion (25%) of her ADLs due to increased ROM and less pain.     Time  6    Period  Weeks    Status  New    Target Date  02/08/18      PT LONG TERM GOAL #6   Title  Pt will transition [facility]    Time  6    Period  Weeks    Status  New    Target Date  02/08/18      PT LONG TERM GOAL #7   Title  Pt will get up from the floor unassisted as needed for safety with travel and at home.     Time  6    Period  Weeks    Status  New    Target Date  02/08/18  Plan - 01/21/18 1214    Clinical Impression Statement  Pt arrives with visible decreased motor control in LLE. She c/o Right hip pain and right neck pain. In prone, soft tissue work to right piriformis and gluteals, with tenderness noted. Continued with hip/lumbar stab, balance and trunk/ hip ROM.     PT Next Visit Plan  develop HEP, balance and mobiity screen, set goal if appropriate     PT Home Exercise Plan  none current, time constraints day of eval     Consulted and Agree with Plan of Care  Patient       Patient will benefit from skilled therapeutic intervention in order to improve the following deficits and impairments:  Abnormal gait, Decreased balance, Decreased endurance, Decreased mobility, Difficulty walking, Hypomobility, Increased muscle spasms, Impaired sensation, Obesity, Improper body  mechanics, Impaired tone, Decreased range of motion, Cardiopulmonary status limiting activity, Decreased activity tolerance, Decreased safety awareness, Decreased strength, Increased fascial restricitons, Impaired flexibility, Postural dysfunction, Pain  Visit Diagnosis: Other abnormalities of gait and mobility  Cervicalgia  Muscle weakness (generalized)     Problem List Patient Active Problem List   Diagnosis Date Noted  . OSA (obstructive sleep apnea) 10/16/2017  . Cervical stenosis of spinal canal 10/23/2016  . Neck swelling 09/28/2011  . Weight gain 01/02/2011  . Carpal tunnel syndrome 11/02/2010  . Leg swelling 08/26/2010  . ADRENAL MASS, LEFT 05/23/2010  . SPINAL STENOSIS, LUMBAR 12/27/2009  . B12 DEFICIENCY 06/10/2009  . ANEMIA 06/10/2009  . BACK PAIN, LUMBAR 11/06/2008  . KNEE PAIN, LEFT 02/03/2008  . DIABETES MELLITUS, TYPE II, UNCONTROLLED 01/12/2008  . MENOPAUSE-RELATED VASOMOTOR SYMPTOMS, HOT FLASHES 06/23/2007  . BURSITIS, LEFT HIP 06/23/2007  . HYPERLIPIDEMIA 03/25/2007  . ANXIETY 03/25/2007  . DEPRESSION 03/25/2007  . PERIPHERAL NEUROPATHY 03/25/2007  . HYPERTENSION 03/25/2007  . CORONARY ARTERY DISEASE 03/25/2007  . DIVERTICULOSIS, COLON 03/25/2007  . TUBULOVILLOUS ADENOMA, COLON 02/18/2007    Dorene Ar, PTA 01/21/2018, 12:39 PM  [facility] [address] [place], [name], [address] Phone: [phone]   Fax:  [phone]  Name: [name] Bean MRN: [id number] Date of Birth: 07/31/1950

## 2018-01-24 ENCOUNTER — Ambulatory Visit: Payer: Medicare Other | Admitting: Physical Therapy

## 2018-01-24 ENCOUNTER — Encounter: Payer: Self-pay | Admitting: Physical Therapy

## 2018-01-24 DIAGNOSIS — M542 Cervicalgia: Secondary | ICD-10-CM | POA: Diagnosis not present

## 2018-01-24 DIAGNOSIS — R2689 Other abnormalities of gait and mobility: Secondary | ICD-10-CM | POA: Diagnosis not present

## 2018-01-24 DIAGNOSIS — M6281 Muscle weakness (generalized): Secondary | ICD-10-CM | POA: Diagnosis not present

## 2018-01-24 NOTE — Therapy (Signed)
[place], [name], [address] Phone: [phone]   Fax:  [phone]  Physical Therapy Treatment  Patient Details  Name: [name] Bean MRN: [id number] Date of Birth: [birth year] Referring Provider (PT): Dr. Ashok Pall    Encounter Date: 01/24/2018  PT End of Session - 01/24/18 1308    Visit Number  5    Number of Visits  12    Date for PT Re-Evaluation  02/08/18    PT Start Time  0100    PT Stop Time  0145    PT Time Calculation (min)  45 min       Past Medical History:  Diagnosis Date  . Anemia   . Anxiety   . Arthritis   . Asthma   . Broken arm    left arm  . Bronchitis   . Bursitis of left hip   . CAD (coronary artery disease)   . Candidiasis, vagina   . Cardiac arrest ([place]) 06/20/2016   at [facility]; ? due to flash pulm edema vs undertreated chronic CHF  . Cerumen impaction   . CHF (congestive heart failure) ([place])    RECENT ADMIT TO [facility]   . Colon, diverticulosis   . Depression   . Diabetes mellitus    15 YRS AGO  . Fatigue   . Gastroenteritis   . Hot flashes, menopausal   . Hyperlipidemia   . Knee pain, left   . Lumbar back pain   . Maxillary sinusitis    history  . Muscle tear    right gluteus  . Nausea   . OSA (obstructive sleep apnea) 10/16/2017  . Other B-complex deficiencies   . Otitis media, acute    left  . Peripheral neuropathy   . Spinal stenosis of lumbar region   . Tubulovillous adenoma of colon   . Unspecified essential hypertension     Past Surgical History:  Procedure Laterality Date  . CARDIAC CATHETERIZATION  2015, 2009, 2006  . CERVICAL SPINE SURGERY  2003  . CORONARY ANGIOPLASTY WITH STENT PLACEMENT  2002, 2015  . EYE SURGERY     cataracts bilaterally  . POSTERIOR CERVICAL LAMINECTOMY N/A 10/23/2016   Procedure: POSTERIOR CERVICAL LAMINECTOMY MULTI LEVEL CERVICAL TWO- CERVICAL THREE, CERVICAL THREE- CERVICAL FOUR;  Surgeon: Ashok Pall, MD;   Location: [place];  Service: Neurosurgery;  Laterality: N/A;  POSTERIOR    There were no vitals filed for this visit.  Subjective Assessment - 01/24/18 1306    Subjective  I want to work on my hips again like last time. I felt better after that.     Currently in Pain?  No/denies                       [facility] Adult PT Treatment/Exercise - 01/24/18 0001      Exercises   Exercises  Knee/Hip      Knee/Hip Exercises: Stretches   Piriformis Stretch  10 seconds;5 reps   towel assist   Other Knee/Hip Stretches  gastroc stretch 3 x 20 sec       Knee/Hip Exercises: Aerobic   Nustep  5 minutes      Knee/Hip Exercises: Standing   Other Standing Knee Exercises  3 way hip at counter top , standing row red band       Knee/Hip Exercises: Supine   Bridges with Ball Squeeze  10 reps    Other Supine Knee/Hip Exercises  ball squeeze  Manual Therapy   Soft tissue mobilization  massage roller to gluteals , PROM hip IR/ER,                   PT Long Term Goals - 12/29/17 0830      PT LONG TERM GOAL #1   Title  Pt will be able to walk in the community as needed (with cart) and decreased Rt LE spasms, pain for shopping.     Time  6    Period  Weeks    Status  New    Target Date  02/08/18      PT LONG TERM GOAL #2   Title  Pt will safely negotiate 12 or more stairs in the community with confidence and min to no fatigue, pain in hips.     Time  6    Period  Weeks    Status  New    Target Date  02/08/18      PT LONG TERM GOAL #3   Title  Pt will be able to be I with HEP for cervical ROM, UE and LE strength    Time  6    Period  Weeks    Status  New    Target Date  02/08/18      PT LONG TERM GOAL #4   Title  Pt will increase cervical AROM by 10 or more degrees in each direction, no lasting pain for improved driving, travel.     Time  6    Period  Weeks    Status  New    Target Date  02/08/18      PT LONG TERM GOAL #5   Title  Pt will be able to use her  LUE for a small portion (25%) of her ADLs due to increased ROM and less pain.     Time  6    Period  Weeks    Status  New    Target Date  02/08/18      PT LONG TERM GOAL #6   Title  Pt will transition [facility]    Time  6    Period  Weeks    Status  New    Target Date  02/08/18      PT LONG TERM GOAL #7   Title  Pt will get up from the floor unassisted as needed for safety with travel and at home.     Time  6    Period  Weeks    Status  New    Target Date  02/08/18            Plan - 01/24/18 1308    Clinical Impression Statement  Pt reports feeling looser after last vist and was able to shop longer at [facility] after last treament. TIghtness returned the next day. Continued with hip stretching, strengthening and SL weightbearing activity. Manual repeated with focus on right glut med. Pt reports feeling much better at end of session.     PT Next Visit Plan  develop HEP, balance and mobiity screen, set goal if appropriate , STW to gluteals, give tennis ball, give print out of piriformis stretch    PT Home Exercise Plan  --    Consulted and Agree with Plan of Care  Patient       Patient will benefit from skilled therapeutic intervention in order to improve the following deficits and impairments:  Abnormal gait,  Decreased balance, Decreased endurance, Decreased mobility, Difficulty walking, Hypomobility, Increased muscle spasms, Impaired sensation, Obesity, Improper body mechanics, Impaired tone, Decreased range of motion, Cardiopulmonary status limiting activity, Decreased activity tolerance, Decreased safety awareness, Decreased strength, Increased fascial restricitons, Impaired flexibility, Postural dysfunction, Pain  Visit Diagnosis: Other abnormalities of gait and mobility  Cervicalgia  Muscle weakness (generalized)     Problem List Patient Active Problem List   Diagnosis Date Noted  . OSA (obstructive sleep apnea)  10/16/2017  . Cervical stenosis of spinal canal 10/23/2016  . Neck swelling 09/28/2011  . Weight gain 01/02/2011  . Carpal tunnel syndrome 11/02/2010  . Leg swelling 08/26/2010  . ADRENAL MASS, LEFT 05/23/2010  . SPINAL STENOSIS, LUMBAR 12/27/2009  . B12 DEFICIENCY 06/10/2009  . ANEMIA 06/10/2009  . BACK PAIN, LUMBAR 11/06/2008  . KNEE PAIN, LEFT 02/03/2008  . DIABETES MELLITUS, TYPE II, UNCONTROLLED 01/12/2008  . MENOPAUSE-RELATED VASOMOTOR SYMPTOMS, HOT FLASHES 06/23/2007  . BURSITIS, LEFT HIP 06/23/2007  . HYPERLIPIDEMIA 03/25/2007  . ANXIETY 03/25/2007  . DEPRESSION 03/25/2007  . PERIPHERAL NEUROPATHY 03/25/2007  . HYPERTENSION 03/25/2007  . CORONARY ARTERY DISEASE 03/25/2007  . DIVERTICULOSIS, COLON 03/25/2007  . TUBULOVILLOUS ADENOMA, COLON 02/18/2007    Dorene Ar, PTA 01/24/2018, 1:51 PM  [place], [name], [address] Phone: [phone]   Fax:  [phone]  Name: [name] Bean MRN: [id number] Date of Birth: 1950-07-11

## 2018-01-24 NOTE — Telephone Encounter (Signed)
Called pt left a message for pt to return my regarding her Losartan medication

## 2018-01-25 NOTE — Telephone Encounter (Signed)
Patient calling in about Losartan Medication

## 2018-01-26 ENCOUNTER — Ambulatory Visit: Payer: Medicare Other | Admitting: Physical Therapy

## 2018-01-31 ENCOUNTER — Ambulatory Visit: Payer: Medicare Other | Admitting: Physical Therapy

## 2018-01-31 ENCOUNTER — Encounter: Payer: Self-pay | Admitting: Physical Therapy

## 2018-01-31 DIAGNOSIS — R2689 Other abnormalities of gait and mobility: Secondary | ICD-10-CM

## 2018-01-31 DIAGNOSIS — M542 Cervicalgia: Secondary | ICD-10-CM | POA: Diagnosis not present

## 2018-01-31 DIAGNOSIS — M6281 Muscle weakness (generalized): Secondary | ICD-10-CM

## 2018-01-31 NOTE — Therapy (Signed)
[name], [name], [address] Phone: [phone]   Fax:  [phone]  Physical Therapy Treatment  Patient Details  Name: [name] Bean MRN: [id number] Date of Birth: 1950-08-11 Referring Provider (PT): Dr. Ashok Pall    Encounter Date: 01/31/2018  PT End of Session - 01/31/18 1306    Visit Number  6    Number of Visits  12    Date for PT Re-Evaluation  02/08/18    PT Start Time  0100    PT Stop Time  0145    PT Time Calculation (min)  45 min       Past Medical History:  Diagnosis Date  . Anemia   . Anxiety   . Arthritis   . Asthma   . Broken arm    left arm  . Bronchitis   . Bursitis of left hip   . CAD (coronary artery disease)   . Candidiasis, vagina   . Cardiac arrest ([place]) 06/20/2016   at [facility]; ? due to flash pulm edema vs undertreated chronic CHF  . Cerumen impaction   . CHF (congestive heart failure) ([place])    RECENT ADMIT TO [facility]   . Colon, diverticulosis   . Depression   . Diabetes mellitus    15 YRS AGO  . Fatigue   . Gastroenteritis   . Hot flashes, menopausal   . Hyperlipidemia   . Knee pain, left   . Lumbar back pain   . Maxillary sinusitis    history  . Muscle tear    right gluteus  . Nausea   . OSA (obstructive sleep apnea) 10/16/2017  . Other B-complex deficiencies   . Otitis media, acute    left  . Peripheral neuropathy   . Spinal stenosis of lumbar region   . Tubulovillous adenoma of colon   . Unspecified essential hypertension     Past Surgical History:  Procedure Laterality Date  . CARDIAC CATHETERIZATION  2015, 2009, 2006  . CERVICAL SPINE SURGERY  2003  . CORONARY ANGIOPLASTY WITH STENT PLACEMENT  2002, 2015  . EYE SURGERY     cataracts bilaterally  . POSTERIOR CERVICAL LAMINECTOMY N/A 10/23/2016   Procedure: POSTERIOR CERVICAL LAMINECTOMY MULTI LEVEL CERVICAL TWO- CERVICAL THREE, CERVICAL THREE- CERVICAL FOUR;  Surgeon: Ashok Pall, MD;   Location: [place];  Service: Neurosurgery;  Laterality: N/A;  POSTERIOR    There were no vitals filed for this visit.  Subjective Assessment - 01/31/18 1306    Subjective  Hip is really bothering me today. My neck is hurting too.     Currently in Pain?  Yes    Pain Score  6     Pain Location  Hip    Pain Orientation  Right    Pain Descriptors / Indicators  Sore    Aggravating Factors   walking up at night, sit -stands, standing.     Pain Relieving Factors  rest, heat     Pain Score  5    Pain Location  Neck    Pain Orientation  Right    Pain Descriptors / Indicators  Aching    Aggravating Factors   constant     Pain Relieving Factors  rest, heat                        [facility] Adult PT Treatment/Exercise - 01/31/18 0001      Neck Exercises: Supine   Other Supine  Exercise  supine yellow band scap stab horizontal abduction, ER and pullovers       Knee/Hip Exercises: Stretches   Passive Hamstring Stretch  2 reps;20 seconds   passive and ITB stretch   Other Knee/Hip Stretches  piriformis and figure 4 assisted with overpressure     Other Knee/Hip Stretches  gastroc stretch 3 x 20 sec       Knee/Hip Exercises: Aerobic   Nustep  5 minutes L2 x 5 min      Knee/Hip Exercises: Standing   Other Standing Knee Exercises  3 way hip at counter top , standing row red band       Knee/Hip Exercises: Supine   Bridges  10 reps    Bridges with [employer]  10 reps    Other Supine Knee/Hip Exercises  pelvic tilts       Manual Therapy   Soft tissue mobilization  massage roller to gluteals , PROM hip IR/ER,              PT Education - 01/31/18 1401    Education Details  HEP     Person(s) Educated  Patient    Methods  Explanation;Handout    Comprehension  Verbalized understanding          PT Long Term Goals - 12/29/17 0830      PT LONG TERM GOAL #1   Title  Pt will be able to walk in the community as needed (with cart) and decreased Rt LE spasms, pain for  shopping.     Time  6    Period  Weeks    Status  New    Target Date  02/08/18      PT LONG TERM GOAL #2   Title  Pt will safely negotiate 12 or more stairs in the community with confidence and min to no fatigue, pain in hips.     Time  6    Period  Weeks    Status  New    Target Date  02/08/18      PT LONG TERM GOAL #3   Title  Pt will be able to be I with HEP for cervical ROM, UE and LE strength    Time  6    Period  Weeks    Status  New    Target Date  02/08/18      PT LONG TERM GOAL #4   Title  Pt will increase cervical AROM by 10 or more degrees in each direction, no lasting pain for improved driving, travel.     Time  6    Period  Weeks    Status  New    Target Date  02/08/18      PT LONG TERM GOAL #5   Title  Pt will be able to use her LUE for a small portion (25%) of her ADLs due to increased ROM and less pain.     Time  6    Period  Weeks    Status  New    Target Date  02/08/18      PT LONG TERM GOAL #6   Title  Pt will transition [facility]    Time  6    Period  Weeks    Status  New    Target Date  02/08/18      PT LONG TERM GOAL #7   Title  Pt will get up from the  floor unassisted as needed for safety with travel and at home.     Time  6    Period  Weeks    Status  New    Target Date  02/08/18            Plan - 01/31/18 1357    Clinical Impression Statement  Pt reports improvement in hip pain and decreased tightness following treatments however pain and tightness return within a few hours. Continued with hip mobility and core. Also worked on scapular stabilization. She is interested in [facility]. Tenderness to palpation right neck musculature and has increased tenderness at right greater trochanterand glute medius. Manual trigger point release performed to both areas, not tolerated as well today as previously. She may benefit from [facility].     PT Next Visit Plan  ERO; add TPDN.; develop HEP, balance and  mobiity screen, set goal if appropriate , STW to gluteals, give tennis ball, give print out of piriformis stretch    PT Home Exercise Plan  SLS, tandem stance , piriformis stretch (IR)        Patient will benefit from skilled therapeutic intervention in order to improve the following deficits and impairments:  Abnormal gait, Decreased balance, Decreased endurance, Decreased mobility, Difficulty walking, Hypomobility, Increased muscle spasms, Impaired sensation, Obesity, Improper body mechanics, Impaired tone, Decreased range of motion, Cardiopulmonary status limiting activity, Decreased activity tolerance, Decreased safety awareness, Decreased strength, Increased fascial restricitons, Impaired flexibility, Postural dysfunction, Pain  Visit Diagnosis: Other abnormalities of gait and mobility  Muscle weakness (generalized)  Cervicalgia     Problem List Patient Active Problem List   Diagnosis Date Noted  . OSA (obstructive sleep apnea) 10/16/2017  . Cervical stenosis of spinal canal 10/23/2016  . Neck swelling 09/28/2011  . Weight gain 01/02/2011  . Carpal tunnel syndrome 11/02/2010  . Leg swelling 08/26/2010  . ADRENAL MASS, LEFT 05/23/2010  . SPINAL STENOSIS, LUMBAR 12/27/2009  . B12 DEFICIENCY 06/10/2009  . ANEMIA 06/10/2009  . BACK PAIN, LUMBAR 11/06/2008  . KNEE PAIN, LEFT 02/03/2008  . DIABETES MELLITUS, TYPE II, UNCONTROLLED 01/12/2008  . MENOPAUSE-RELATED VASOMOTOR SYMPTOMS, HOT FLASHES 06/23/2007  . BURSITIS, LEFT HIP 06/23/2007  . HYPERLIPIDEMIA 03/25/2007  . ANXIETY 03/25/2007  . DEPRESSION 03/25/2007  . PERIPHERAL NEUROPATHY 03/25/2007  . HYPERTENSION 03/25/2007  . CORONARY ARTERY DISEASE 03/25/2007  . DIVERTICULOSIS, COLON 03/25/2007  . TUBULOVILLOUS ADENOMA, COLON 02/18/2007    Dorene Ar., PTA 01/31/2018, 2:02 PM  [place], [name], [address] Phone: [phone]   Fax:   [phone]  Name: Unika Nazareno MRN: [id number] Date of Birth: 06-Feb-1951

## 2018-01-31 NOTE — Patient Instructions (Signed)
Piriformis Stretch    Lying on back, pull right knee toward opposite shoulder. Hold _30___ seconds. USE SHEET OR TOWEL TO ASSIST  Repeat _3_ times. Do _3___ sessions per day.  http://gt2.exer.us/258   Copyright  VHI. All rights reserved.

## 2018-02-02 ENCOUNTER — Ambulatory Visit: Payer: Medicare Other | Admitting: Physical Therapy

## 2018-02-04 ENCOUNTER — Encounter: Payer: Self-pay | Admitting: Physical Therapy

## 2018-02-04 ENCOUNTER — Ambulatory Visit: Payer: Medicare Other | Admitting: Physical Therapy

## 2018-02-04 DIAGNOSIS — R2689 Other abnormalities of gait and mobility: Secondary | ICD-10-CM

## 2018-02-04 DIAGNOSIS — M6281 Muscle weakness (generalized): Secondary | ICD-10-CM

## 2018-02-04 DIAGNOSIS — M542 Cervicalgia: Secondary | ICD-10-CM

## 2018-02-04 NOTE — Therapy (Addendum)
[place], [name], [address] Phone: [phone]   Fax:  [phone]  Physical Therapy Treatment/Renewal/Progress Note   Patient Details  Name: [name] Bean MRN: [id number] Date of Birth: 08/26/1950 Referring Provider (PT): Dr. Ashok Pall     Progress Note  Reporting Period 12/28/17 to 02/05/2019  See note below for Objective Data and Assessment of Progress/Goals.    Encounter Date: 02/04/2018  PT End of Session - 02/04/18 0856    Visit Number  7    Date for PT Re-Evaluation  03/04/18    PT Start Time  0848    PT Stop Time  0945    PT Time Calculation (min)  57 min    Activity Tolerance  Patient tolerated treatment well    Behavior During Therapy  [facility] for tasks assessed/performed       Past Medical History:  Diagnosis Date  . Anemia   . Anxiety   . Arthritis   . Asthma   . Broken arm    left arm  . Bronchitis   . Bursitis of left hip   . CAD (coronary artery disease)   . Candidiasis, vagina   . Cardiac arrest ([place]) 06/20/2016   at [facility]; ? due to flash pulm edema vs undertreated chronic CHF  . Cerumen impaction   . CHF (congestive heart failure) ([place])    RECENT ADMIT TO [facility]   . Colon, diverticulosis   . Depression   . Diabetes mellitus    15 YRS AGO  . Fatigue   . Gastroenteritis   . Hot flashes, menopausal   . Hyperlipidemia   . Knee pain, left   . Lumbar back pain   . Maxillary sinusitis    history  . Muscle tear    right gluteus  . Nausea   . OSA (obstructive sleep apnea) 10/16/2017  . Other B-complex deficiencies   . Otitis media, acute    left  . Peripheral neuropathy   . Spinal stenosis of lumbar region   . Tubulovillous adenoma of colon   . Unspecified essential hypertension     Past Surgical History:  Procedure Laterality Date  . CARDIAC CATHETERIZATION  2015, 2009, 2006  . CERVICAL SPINE SURGERY  2003  . CORONARY ANGIOPLASTY WITH STENT PLACEMENT   2002, 2015  . EYE SURGERY     cataracts bilaterally  . POSTERIOR CERVICAL LAMINECTOMY N/A 10/23/2016   Procedure: POSTERIOR CERVICAL LAMINECTOMY MULTI LEVEL CERVICAL TWO- CERVICAL THREE, CERVICAL THREE- CERVICAL FOUR;  Surgeon: Ashok Pall, MD;  Location: [place];  Service: Neurosurgery;  Laterality: N/A;  POSTERIOR    There were no vitals filed for this visit.  Subjective Assessment - 02/04/18 0854    Subjective  Pt hurting due to cold, early in the day.  Both my cheeks are hurting.  Rt hip. Neck is OK but its early. I want to talk about dry needling.     Currently in Pain?  Yes    Pain Score  5     Pain Location  Hip    Pain Orientation  Right    Pain Descriptors / Indicators  Sore    Pain Type  Chronic pain    Pain Radiating Towards  Rt buttock.     Pain Onset  More than a month ago    Pain Frequency  Intermittent         [facility] PT Assessment - 02/04/18 0001      Observation/Other Assessments  Focus on Therapeutic Outcomes (FOTO)   59%      AROM   Cervical Flexion  40    Cervical Extension  10    Cervical - Right Side Bend  20    Cervical - Left Side Bend  20    Cervical - Right Rotation  45    Cervical - Left Rotation  30      Strength   Right Shoulder Flexion  4+/5    Right Shoulder ABduction  4+/5    Left Shoulder Flexion  4/5    Left Shoulder ABduction  4-/5    Right Hip Flexion  4/5    Left Hip Flexion  4+/5    Right Knee Flexion  5/5    Right Knee Extension  5/5    Left Knee Flexion  5/5    Left Knee Extension  5/5          [facility] Adult PT Treatment/Exercise - 02/04/18 0001      Knee/Hip Exercises: Stretches   Hip Flexor Stretch  Right;3 reps;60 seconds    Piriformis Stretch  Right;3 reps;30 seconds      Knee/Hip Exercises: Aerobic   Nustep  5 minutes L2 x 5 min      Knee/Hip Exercises: Sidelying   Other Sidelying Knee/Hip Exercises  Rt trunk stretching for manual       Moist Heat Therapy   Number Minutes Moist Heat  10 Minutes    Moist Heat  Location  Lumbar Spine;Hip      Manual Therapy   Manual Therapy  Joint mobilization;Soft tissue mobilization;Passive ROM    Manual therapy comments  LAD x 5 Rt LE with slight flexion, ER     Joint Mobilization  Rt hip ER/IR    Soft tissue mobilization  Rt quads, ITB, TFL, lateral hip, glutes IASTM used     Passive ROM  bilateral hip ER/IR                  PT Long Term Goals - 02/04/18 6789      PT LONG TERM GOAL #1   Title  Pt will be able to walk in the community as needed (with cart) and decreased Rt LE spasms, pain for shopping.     Baseline  anyplace that has a cart I can walk.  Pain improved.  Still hurts especially if it rains.     Status  Partially Met      PT LONG TERM GOAL #2   Title  Pt will safely negotiate 12 or more stairs in the community with confidence and min to no fatigue, pain in hips.     Baseline  we dont do stairs    Status  On-going      PT LONG TERM GOAL #3   Title  Pt will be able to be I with HEP for cervical ROM, UE and LE strength    Status  On-going      PT LONG TERM GOAL #4   Title  Pt will increase cervical AROM by 10 or more degrees in each direction, no lasting pain for improved driving, travel.       PT LONG TERM GOAL #5   Title  Pt will be able to use her LUE for a small portion (25%) of her ADLs due to increased ROM and less pain.     Baseline  avoids, limits due to lack of ROM and pain     Status  On-going  PT LONG TERM GOAL #6   Title  Pt will transition [facility]    Status  On-going      PT LONG TERM GOAL #7   Title  Pt will get up from the floor unassisted as needed for safety with travel and at home.     Status  On-going            Plan - 02/04/18 1047    Clinical Impression Statement  Patient renewed for more PT to include Trigger Point Dry Needling.  She is particularly tight and sore in Rt TFL, Glute med today, does has pretty consistent piriformis pain.  She understands that this will not be a quick fix but since she does have reduced pain with stretching and manual, I feel this may be a tool for her to manage her pain in the future.  She continues to lack AROM in L UE, cervical spine, Rt hip and trunk.  She was encouraged to use her cane in the community, did not have it today. Gait is ataxic.  Her Rt UE has improved in strength.     PT Frequency  2x / week    PT Duration  4 weeks    PT Treatment/Interventions  ADLs/Self Care Home Management;Functional mobility training;Neuromuscular re-education;Passive range of motion;Manual techniques;Moist Heat;Gait training;DME Instruction;Therapeutic activities;Therapeutic exercise;Patient/family education;Cryotherapy;Electrical Stimulation;Balance training;Stair training;Dry needling    PT Next Visit Plan  cont trunk, limb flexibility.  DN to Rt hip.      PT Home Exercise Plan  SLS, tandem stance , piriformis stretch (IR)     Consulted and Agree with Plan of Care  Patient       Patient will benefit from skilled therapeutic intervention in order to improve the following deficits and impairments:  Abnormal gait, Decreased balance, Decreased endurance, Decreased mobility, Difficulty walking, Hypomobility, Increased muscle spasms, Impaired sensation, Obesity, Improper body mechanics, Impaired tone, Decreased range of motion, Cardiopulmonary status limiting activity, Decreased activity tolerance, Decreased safety awareness, Decreased strength, Increased fascial restricitons, Impaired flexibility, Postural dysfunction, Pain  Visit Diagnosis: Other abnormalities of gait and mobility  Muscle weakness (generalized)  Cervicalgia     Problem List Patient Active Problem List   Diagnosis Date Noted  . OSA (obstructive sleep apnea) 10/16/2017  . Cervical stenosis of spinal canal 10/23/2016  . Neck swelling 09/28/2011  . Weight gain 01/02/2011  . Carpal tunnel syndrome 11/02/2010  . Leg swelling 08/26/2010  .  ADRENAL MASS, LEFT 05/23/2010  . SPINAL STENOSIS, LUMBAR 12/27/2009  . B12 DEFICIENCY 06/10/2009  . ANEMIA 06/10/2009  . BACK PAIN, LUMBAR 11/06/2008  . KNEE PAIN, LEFT 02/03/2008  . DIABETES MELLITUS, TYPE II, UNCONTROLLED 01/12/2008  . MENOPAUSE-RELATED VASOMOTOR SYMPTOMS, HOT FLASHES 06/23/2007  . BURSITIS, LEFT HIP 06/23/2007  . HYPERLIPIDEMIA 03/25/2007  . ANXIETY 03/25/2007  . DEPRESSION 03/25/2007  . PERIPHERAL NEUROPATHY 03/25/2007  . HYPERTENSION 03/25/2007  . CORONARY ARTERY DISEASE 03/25/2007  . DIVERTICULOSIS, COLON 03/25/2007  . TUBULOVILLOUS ADENOMA, COLON 02/18/2007    Daryn Hicks 02/04/2018, 11:04 AM  [facility] [address] [place], [name], [address] Phone: [phone]   Fax:  [phone]  Name: [name] Bean MRN: [id number] Date of Birth: 07-07-1950  Raeford Razor, PT 02/04/18 11:04 AM Phone: [phone] Fax: [phone]  Raeford Razor, PT 05/12/18 1:26 PM Phone: [phone] Fax: [phone]

## 2018-02-09 ENCOUNTER — Ambulatory Visit: Payer: Medicare Other | Admitting: Physical Therapy

## 2018-02-11 ENCOUNTER — Encounter: Payer: Self-pay | Admitting: Physical Therapy

## 2018-02-11 ENCOUNTER — Ambulatory Visit: Payer: Medicare Other | Attending: Neurosurgery | Admitting: Physical Therapy

## 2018-02-11 DIAGNOSIS — M6281 Muscle weakness (generalized): Secondary | ICD-10-CM | POA: Insufficient documentation

## 2018-02-11 DIAGNOSIS — R2689 Other abnormalities of gait and mobility: Secondary | ICD-10-CM | POA: Diagnosis not present

## 2018-02-11 DIAGNOSIS — M542 Cervicalgia: Secondary | ICD-10-CM | POA: Diagnosis not present

## 2018-02-11 NOTE — Therapy (Signed)
[place], [name], [address] Phone: [phone]   Fax:  [phone]  Physical Therapy Treatment  Patient Details  Name: [name] Bean MRN: [id number] Date of Birth: Jan 29, 1951 Referring Provider (PT): Dr. Ashok Pall    Encounter Date: 02/11/2018  PT End of Session - 02/11/18 1202    Visit Number  8    Number of Visits  12    Date for PT Re-Evaluation  03/04/18    PT Start Time  1107    PT Stop Time  1147    PT Time Calculation (min)  40 min    Activity Tolerance  Patient tolerated treatment well    Behavior During Therapy  [facility] for tasks assessed/performed       Past Medical History:  Diagnosis Date  . Anemia   . Anxiety   . Arthritis   . Asthma   . Broken arm    left arm  . Bronchitis   . Bursitis of left hip   . CAD (coronary artery disease)   . Candidiasis, vagina   . Cardiac arrest ([place]) 06/20/2016   at [facility]; ? due to flash pulm edema vs undertreated chronic CHF  . Cerumen impaction   . CHF (congestive heart failure) ([place])    RECENT ADMIT TO [facility]   . Colon, diverticulosis   . Depression   . Diabetes mellitus    15 YRS AGO  . Fatigue   . Gastroenteritis   . Hot flashes, menopausal   . Hyperlipidemia   . Knee pain, left   . Lumbar back pain   . Maxillary sinusitis    history  . Muscle tear    right gluteus  . Nausea   . OSA (obstructive sleep apnea) 10/16/2017  . Other B-complex deficiencies   . Otitis media, acute    left  . Peripheral neuropathy   . Spinal stenosis of lumbar region   . Tubulovillous adenoma of colon   . Unspecified essential hypertension     Past Surgical History:  Procedure Laterality Date  . CARDIAC CATHETERIZATION  2015, 2009, 2006  . CERVICAL SPINE SURGERY  2003  . CORONARY ANGIOPLASTY WITH STENT PLACEMENT  2002, 2015  . EYE SURGERY     cataracts bilaterally  . POSTERIOR CERVICAL LAMINECTOMY N/A 10/23/2016   Procedure: POSTERIOR  CERVICAL LAMINECTOMY MULTI LEVEL CERVICAL TWO- CERVICAL THREE, CERVICAL THREE- CERVICAL FOUR;  Surgeon: Ashok Pall, MD;  Location: [place];  Service: Neurosurgery;  Laterality: N/A;  POSTERIOR    There were no vitals filed for this visit.  Subjective Assessment - 02/11/18 1156    Subjective  Pt. reports bad week with pain-she had a "stomach bug" earlier in the week (no symptoms since Tuesday) and had some vertigo on Wednesday. This has since resolved but she does report that she fell from her couch on Wednesday-no injury noted but she had some difficulty getting up from floor. Pt. still interested in trial dry needling. Primary area of pain today is right lateral hip.    Currently in Pain?  Yes    Pain Score  5     Pain Location  Hip    Pain Orientation  Right;Lateral    Pain Descriptors / Indicators  Sore    Pain Type  Chronic pain    Pain Radiating Towards  Right buttock    Pain Onset  More than a month ago    Pain Frequency  Intermittent    Aggravating  Factors   sit>stand, standing, walking    Pain Relieving Factors  rest and heat         [facility] PT Assessment - 02/11/18 0001      Balance Screen   Has the patient fallen in the past 6 months  --   fall off of couch reported with occurence 02/09/18                  [facility] Adult PT Treatment/Exercise - 02/11/18 0001      Knee/Hip Exercises: Stretches   Piriformis Stretch  Right;3 reps;30 seconds    Other Knee/Hip Stretches  Right IT band/TFL stretch 3x30 sec      Knee/Hip Exercises: Supine   Bridges  15 reps    Other Supine Knee/Hip Exercises  clamshell green Theraband x 15 reps      Knee/Hip Exercises: Sidelying   Other Sidelying Knee/Hip Exercises  --   Right TFL,QL stretch from modified Ober's 3x30 sec     Manual Therapy   Manual Therapy  Joint mobilization;Soft tissue mobilization    Joint Mobilization  R hip long axis distraction grade I-III    Soft tissue mobilization  Right lateral hip-gluteus medius and  TFL, pririformis, ITB       Trigger Point Dry Needling - 02/11/18 1200    Consent Given?  Yes    Education Handout Provided  Yes    Muscles Treated Lower Body  Gluteus minimus;Tensor fascia lata   gluteus medius, minimus and TFL   Gluteus Minimus Response  Twitch response elicited    Tensor Fascia Lata Response  Palpable increased muscle length           PT Education - 02/11/18 1203    Education Details  POC, dry needling-risks and potential benefits of procedure    Person(s) Educated  Patient    Methods  Explanation;Handout    Comprehension  Verbalized understanding          PT Long Term Goals - 02/04/18 0903      PT LONG TERM GOAL #1   Title  Pt will be able to walk in the community as needed (with cart) and decreased Rt LE spasms, pain for shopping.     Baseline  anyplace that has a cart I can walk.  Pain improved.  Still hurts especially if it rains.     Status  Partially Met      PT LONG TERM GOAL #2   Title  Pt will safely negotiate 12 or more stairs in the community with confidence and min to no fatigue, pain in hips.     Baseline  we dont do stairs    Status  On-going      PT LONG TERM GOAL #3   Title  Pt will be able to be I with HEP for cervical ROM, UE and LE strength    Status  On-going      PT LONG TERM GOAL #4   Title  Pt will increase cervical AROM by 10 or more degrees in each direction, no lasting pain for improved driving, travel.       PT LONG TERM GOAL #5   Title  Pt will be able to use her LUE for a small portion (25%) of her ADLs due to increased ROM and less pain.     Baseline  avoids, limits due to lack of ROM and pain     Status  On-going      PT LONG TERM  GOAL #6   Title  Pt will [facility]    Status  On-going      PT LONG TERM GOAL #7   Title  Pt will get up from the floor unassisted as needed for safety with travel and at home.     Status  On-going             Plan - 02/11/18 1204    Clinical Impression Statement  Some post-tx. soreness but trial dry needling well-tolerated. Very tight with associated trigger points right gluteus medius and TFL region. Tx. focus otherwise stretches/strengthening for hip muscle tightness and weakness. No apparent injury from fall/sliding forward off of couch. Symptom description could be consistent with positional vertigo so if having any persistent issues would recommend follow up with MD for further assessment and tx. referral as needed.     PT Frequency  2x / week    PT Duration  4 weeks    PT Treatment/Interventions  ADLs/Self Care Home Management;Functional mobility training;Neuromuscular re-education;Passive range of motion;Manual techniques;Moist Heat;Gait training;DME Instruction;Therapeutic activities;Therapeutic exercise;Patient/family education;Cryotherapy;Electrical Stimulation;Balance training;Stair training;Dry needling    PT Next Visit Plan  cont trunk, limb flexibility. Check response dry needling and further include in plan of care as found beneficial.    PT Home Exercise Plan  SLS, tandem stance , piriformis stretch (IR)     Consulted and Agree with Plan of Care  Patient       Patient will benefit from skilled therapeutic intervention in order to improve the following deficits and impairments:  Abnormal gait, Decreased balance, Decreased endurance, Decreased mobility, Difficulty walking, Hypomobility, Increased muscle spasms, Impaired sensation, Obesity, Improper body mechanics, Impaired tone, Decreased range of motion, Cardiopulmonary status limiting activity, Decreased activity tolerance, Decreased safety awareness, Decreased strength, Increased fascial restricitons, Impaired flexibility, Postural dysfunction, Pain  Visit Diagnosis: Other abnormalities of gait and mobility  Muscle weakness (generalized)  Cervicalgia     Problem List Patient Active Problem List   Diagnosis Date  Noted  . OSA (obstructive sleep apnea) 10/16/2017  . Cervical stenosis of spinal canal 10/23/2016  . Neck swelling 09/28/2011  . Weight gain 01/02/2011  . Carpal tunnel syndrome 11/02/2010  . Leg swelling 08/26/2010  . ADRENAL MASS, LEFT 05/23/2010  . SPINAL STENOSIS, LUMBAR 12/27/2009  . B12 DEFICIENCY 06/10/2009  . ANEMIA 06/10/2009  . BACK PAIN, LUMBAR 11/06/2008  . KNEE PAIN, LEFT 02/03/2008  . DIABETES MELLITUS, TYPE II, UNCONTROLLED 01/12/2008  . MENOPAUSE-RELATED VASOMOTOR SYMPTOMS, HOT FLASHES 06/23/2007  . BURSITIS, LEFT HIP 06/23/2007  . HYPERLIPIDEMIA 03/25/2007  . ANXIETY 03/25/2007  . DEPRESSION 03/25/2007  . PERIPHERAL NEUROPATHY 03/25/2007  . HYPERTENSION 03/25/2007  . CORONARY ARTERY DISEASE 03/25/2007  . DIVERTICULOSIS, COLON 03/25/2007  . TUBULOVILLOUS ADENOMA, COLON 02/18/2007    Beaulah Dinning, PT, DPT 02/11/18 12:12 PM  [place], [name], [address] Phone: [phone]   Fax:  [phone]  Name: [name] Bean MRN: [id number] Date of Birth: Jul 13, 1950

## 2018-02-11 NOTE — Patient Instructions (Signed)

## 2018-02-16 ENCOUNTER — Ambulatory Visit: Payer: Medicare Other | Admitting: Physical Therapy

## 2018-02-16 ENCOUNTER — Encounter: Payer: Self-pay | Admitting: Physical Therapy

## 2018-02-16 DIAGNOSIS — M6281 Muscle weakness (generalized): Secondary | ICD-10-CM | POA: Diagnosis not present

## 2018-02-16 DIAGNOSIS — R2689 Other abnormalities of gait and mobility: Secondary | ICD-10-CM

## 2018-02-16 DIAGNOSIS — M542 Cervicalgia: Secondary | ICD-10-CM | POA: Diagnosis not present

## 2018-02-16 NOTE — Therapy (Signed)
[place], [name], [address] Phone: [phone]   Fax:  [phone]  Physical Therapy Treatment  Patient Details  Name: [name] Bean MRN: [id number] Date of Birth: 09-09-1950 Referring Provider (PT): Dr. Ashok Pall    Encounter Date: 02/16/2018  PT End of Session - 02/16/18 1443    Visit Number  9    Number of Visits  12    Date for PT Re-Evaluation  03/04/18    PT Start Time  1440    PT Stop Time  1520    PT Time Calculation (min)  40 min    Activity Tolerance  Patient tolerated treatment well    Behavior During Therapy  [facility] for tasks assessed/performed       Past Medical History:  Diagnosis Date  . Anemia   . Anxiety   . Arthritis   . Asthma   . Broken arm    left arm  . Bronchitis   . Bursitis of left hip   . CAD (coronary artery disease)   . Candidiasis, vagina   . Cardiac arrest ([place]) 06/20/2016   at [facility]; ? due to flash pulm edema vs undertreated chronic CHF  . Cerumen impaction   . CHF (congestive heart failure) ([place])    RECENT ADMIT TO [facility]   . Colon, diverticulosis   . Depression   . Diabetes mellitus    15 YRS AGO  . Fatigue   . Gastroenteritis   . Hot flashes, menopausal   . Hyperlipidemia   . Knee pain, left   . Lumbar back pain   . Maxillary sinusitis    history  . Muscle tear    right gluteus  . Nausea   . OSA (obstructive sleep apnea) 10/16/2017  . Other B-complex deficiencies   . Otitis media, acute    left  . Peripheral neuropathy   . Spinal stenosis of lumbar region   . Tubulovillous adenoma of colon   . Unspecified essential hypertension     Past Surgical History:  Procedure Laterality Date  . CARDIAC CATHETERIZATION  2015, 2009, 2006  . CERVICAL SPINE SURGERY  2003  . CORONARY ANGIOPLASTY WITH STENT PLACEMENT  2002, 2015  . EYE SURGERY     cataracts bilaterally  . POSTERIOR CERVICAL LAMINECTOMY N/A 10/23/2016   Procedure: POSTERIOR  CERVICAL LAMINECTOMY MULTI LEVEL CERVICAL TWO- CERVICAL THREE, CERVICAL THREE- CERVICAL FOUR;  Surgeon: Ashok Pall, MD;  Location: [place];  Service: Neurosurgery;  Laterality: N/A;  POSTERIOR    There were no vitals filed for this visit.  Subjective Assessment - 02/16/18 1443    Subjective  Today is the first time in a week her hip hurt.  3/10. Had 1 day when my hip felt normal.  I want to do my neck .      Currently in Pain?  Yes    Pain Score  4     Pain Onset  More than a month ago    Pain Frequency  Intermittent          [facility] Adult PT Treatment/Exercise - 02/16/18 0001      Self-Care   Self-Care  Other Self-Care Comments    Other Self-Care Comments   tennis ball for self massage to rt hip, standing and supine       Knee/Hip Exercises: Stretches   Passive Hamstring Stretch  Both;2 reps    ITB Stretch  Both;2 reps    Piriformis Stretch  Both;3  reps;30 seconds    Other Knee/Hip Stretches  standing adductor stretch x 4       Knee/Hip Exercises: Standing   Functional Squat  1 set;10 reps    Functional Squat Limitations  Countertop hold     Other Standing Knee Exercises  Sumo squat x 10      Manual Therapy   Joint Mobilization  ER/IR with compression , R hip long axis distraction grade I-III    Soft tissue mobilization  Rt hip, lateral and posterior    piriformis and glute med    Passive ROM  Rt/ LT hip ER/IR              PT Education - 02/16/18 1525    Education Details  tennis ball     Person(s) Educated  Patient    Methods  Explanation    Comprehension  Verbalized understanding          PT Long Term Goals - 02/04/18 0903      PT LONG TERM GOAL #1   Title  Pt will be able to walk in the community as needed (with cart) and decreased Rt LE spasms, pain for shopping.     Baseline  anyplace that has a cart I can walk.  Pain improved.  Still hurts especially if it rains.     Status  Partially Met      PT LONG TERM GOAL #2   Title  Pt will safely  negotiate 12 or more stairs in the community with confidence and min to no fatigue, pain in hips.     Baseline  we dont do stairs    Status  On-going      PT LONG TERM GOAL #3   Title  Pt will be able to be I with HEP for cervical ROM, UE and LE strength    Status  On-going      PT LONG TERM GOAL #4   Title  Pt will increase cervical AROM by 10 or more degrees in each direction, no lasting pain for improved driving, travel.       PT LONG TERM GOAL #5   Title  Pt will be able to use her LUE for a small portion (25%) of her ADLs due to increased ROM and less pain.     Baseline  avoids, limits due to lack of ROM and pain     Status  On-going      PT LONG TERM GOAL #6   Title  Pt will transition [facility]    Status  On-going      PT LONG TERM GOAL #7   Title  Pt will get up from the floor unassisted as needed for safety with travel and at home.     Status  On-going            Plan - 02/16/18 1526    Clinical Impression Statement  Pt with favorable response to trigger point dry needling.  She reports falling again after treatment, was turning off the light in the closet and fell as she reached.  She was able to get back up within 15 min.  Does not use cane in the clinic despite reminders. Reported resolution of Rt hip pain after manual and stretching.  Would like info on dry needling post PT     PT Treatment/Interventions  ADLs/Self Care Home Management;Functional mobility training;Neuromuscular re-education;Passive range of motion;Manual techniques;Moist Heat;Gait training;DME Instruction;Therapeutic  activities;Therapeutic exercise;Patient/family education;Cryotherapy;Electrical Stimulation;Balance training;Stair training;Dry needling    PT Next Visit Plan  cont trunk, limb flexibility. dry needling    PT Home Exercise Plan  SLS, tandem stance , piriformis stretch (IR).  Has some exercises that she does given to her from previous  episode.      Consulted and Agree with Plan of Care  Patient       Patient will benefit from skilled therapeutic intervention in order to improve the following deficits and impairments:  Abnormal gait, Decreased balance, Decreased endurance, Decreased mobility, Difficulty walking, Hypomobility, Increased muscle spasms, Impaired sensation, Obesity, Improper body mechanics, Impaired tone, Decreased range of motion, Cardiopulmonary status limiting activity, Decreased activity tolerance, Decreased safety awareness, Decreased strength, Increased fascial restricitons, Impaired flexibility, Postural dysfunction, Pain  Visit Diagnosis: Other abnormalities of gait and mobility  Muscle weakness (generalized)  Cervicalgia     Problem List Patient Active Problem List   Diagnosis Date Noted  . OSA (obstructive sleep apnea) 10/16/2017  . Cervical stenosis of spinal canal 10/23/2016  . Neck swelling 09/28/2011  . Weight gain 01/02/2011  . Carpal tunnel syndrome 11/02/2010  . Leg swelling 08/26/2010  . ADRENAL MASS, LEFT 05/23/2010  . SPINAL STENOSIS, LUMBAR 12/27/2009  . B12 DEFICIENCY 06/10/2009  . ANEMIA 06/10/2009  . BACK PAIN, LUMBAR 11/06/2008  . KNEE PAIN, LEFT 02/03/2008  . DIABETES MELLITUS, TYPE II, UNCONTROLLED 01/12/2008  . MENOPAUSE-RELATED VASOMOTOR SYMPTOMS, HOT FLASHES 06/23/2007  . BURSITIS, LEFT HIP 06/23/2007  . HYPERLIPIDEMIA 03/25/2007  . ANXIETY 03/25/2007  . DEPRESSION 03/25/2007  . PERIPHERAL NEUROPATHY 03/25/2007  . HYPERTENSION 03/25/2007  . CORONARY ARTERY DISEASE 03/25/2007  . DIVERTICULOSIS, COLON 03/25/2007  . TUBULOVILLOUS ADENOMA, COLON 02/18/2007    [name] Bean 02/16/2018, 3:34 PM  [facility] [address] [place], [name], [address] Phone: [phone]   Fax:  [phone]  Name: [name] Bean MRN: [id number] Date of Birth: 1951-04-10  Raeford Razor, PT 02/16/18 3:34 PM Phone:  [phone] Fax: [phone]

## 2018-02-18 ENCOUNTER — Encounter: Payer: Self-pay | Admitting: Physical Therapy

## 2018-02-18 ENCOUNTER — Ambulatory Visit: Payer: Medicare Other | Admitting: Physical Therapy

## 2018-02-18 DIAGNOSIS — M6281 Muscle weakness (generalized): Secondary | ICD-10-CM

## 2018-02-18 DIAGNOSIS — R2689 Other abnormalities of gait and mobility: Secondary | ICD-10-CM

## 2018-02-18 DIAGNOSIS — M542 Cervicalgia: Secondary | ICD-10-CM | POA: Diagnosis not present

## 2018-02-18 NOTE — Therapy (Signed)
[place], [name], [address] Phone: [phone]   Fax:  [phone]  Physical Therapy Treatment  Patient Details  Name: [name] Bean MRN: [id number] Date of Birth: 07/16/1950 Referring Provider (PT): Dr. Ashok Pall    Encounter Date: 02/18/2018  PT End of Session - 02/18/18 1203    Visit Number  10    Number of Visits  12    Date for PT Re-Evaluation  03/04/18    PT Start Time  1016    PT Stop Time  1059    PT Time Calculation (min)  43 min    Activity Tolerance  Patient tolerated treatment well    Behavior During Therapy  [facility] for tasks assessed/performed       Past Medical History:  Diagnosis Date  . Anemia   . Anxiety   . Arthritis   . Asthma   . Broken arm    left arm  . Bronchitis   . Bursitis of left hip   . CAD (coronary artery disease)   . Candidiasis, vagina   . Cardiac arrest ([place]) 06/20/2016   at [facility]; ? due to flash pulm edema vs undertreated chronic CHF  . Cerumen impaction   . CHF (congestive heart failure) ([place])    RECENT ADMIT TO [facility]   . Colon, diverticulosis   . Depression   . Diabetes mellitus    15 YRS AGO  . Fatigue   . Gastroenteritis   . Hot flashes, menopausal   . Hyperlipidemia   . Knee pain, left   . Lumbar back pain   . Maxillary sinusitis    history  . Muscle tear    right gluteus  . Nausea   . OSA (obstructive sleep apnea) 10/16/2017  . Other B-complex deficiencies   . Otitis media, acute    left  . Peripheral neuropathy   . Spinal stenosis of lumbar region   . Tubulovillous adenoma of colon   . Unspecified essential hypertension     Past Surgical History:  Procedure Laterality Date  . CARDIAC CATHETERIZATION  2015, 2009, 2006  . CERVICAL SPINE SURGERY  2003  . CORONARY ANGIOPLASTY WITH STENT PLACEMENT  2002, 2015  . EYE SURGERY     cataracts bilaterally  . POSTERIOR CERVICAL LAMINECTOMY N/A 10/23/2016   Procedure: POSTERIOR  CERVICAL LAMINECTOMY MULTI LEVEL CERVICAL TWO- CERVICAL THREE, CERVICAL THREE- CERVICAL FOUR;  Surgeon: Ashok Pall, MD;  Location: [facility];  Service: Neurosurgery;  Laterality: N/A;  POSTERIOR    There were no vitals filed for this visit.  Subjective Assessment - 02/18/18 1157    Subjective  Pt. reports having some muscle soreness in hip after last session so requests focus on neck region today. Primary pain complaint for this is right upper trapezius pain    Currently in Pain?  Yes    Pain Score  4     Pain Location  Hip    Pain Orientation  Right;Lateral    Pain Descriptors / Indicators  Sore    Pain Type  Chronic pain    Pain Radiating Towards  Right buttock    Pain Onset  More than a month ago    Pain Frequency  Intermittent    Aggravating Factors   sit>stand, prolonged standing and walking    Pain Relieving Factors  rest and heat    Multiple Pain Sites  Yes    Pain Score  4    Pain Location  Neck    Pain Orientation  Right    Pain Descriptors / Indicators  Aching    Pain Type  Chronic pain    Pain Radiating Towards  Right upper trapezius    Pain Onset  More than a month ago    Pain Frequency  Intermittent    Aggravating Factors   no specific aggravating factors noted    Pain Relieving Factors  rest, heat    Effect of Pain on Daily Activities  difficulty with lifting activities                       [facility] Adult PT Treatment/Exercise - 02/18/18 0001      Exercises   Exercises  Neck      Neck Exercises: Supine   Neck Retraction  15 reps      Knee/Hip Exercises: Stretches   ITB Stretch  Right;3 reps;30 seconds    Piriformis Stretch  Right;3 reps;30 seconds      Manual Therapy   Manual Therapy  Soft tissue mobilization;Myofascial release    Soft tissue mobilization  STM and myofascial release right upper trapezius and levator region in supine + sitting      Neck Exercises: Stretches   Upper Trapezius Stretch  Right;3 reps;30 seconds    Levator  Stretch  Right;3 reps;30 seconds       Trigger Point Dry Needling - 02/18/18 1203    Consent Given?  Yes    Muscles Treated Upper Body  Upper trapezius   Right upper trapezius x 5 min          PT Education - 02/18/18 1203    Education Details  POC    Person(s) Educated  Patient    Methods  Explanation    Comprehension  Verbalized understanding          PT Long Term Goals - 02/04/18 0903      PT LONG TERM GOAL #1   Title  Pt will be able to walk in the community as needed (with cart) and decreased Rt LE spasms, pain for shopping.     Baseline  anyplace that has a cart I can walk.  Pain improved.  Still hurts especially if it rains.     Status  Partially Met      PT LONG TERM GOAL #2   Title  Pt will safely negotiate 12 or more stairs in the community with confidence and min to no fatigue, pain in hips.     Baseline  we dont do stairs    Status  On-going      PT LONG TERM GOAL #3   Title  Pt will be able to be I with HEP for cervical ROM, UE and LE strength    Status  On-going      PT LONG TERM GOAL #4   Title  Pt will increase cervical AROM by 10 or more degrees in each direction, no lasting pain for improved driving, travel.       PT LONG TERM GOAL #5   Title  Pt will be able to use her LUE for a small portion (25%) of her ADLs due to increased ROM and less pain.     Baseline  avoids, limits due to lack of ROM and pain     Status  On-going      PT LONG TERM GOAL #6   Title  Pt will [facility]    Status  On-going      PT LONG TERM GOAL #7   Title  Pt will get up from the floor unassisted as needed for safety with travel and at home.     Status  On-going            Plan - 02/18/18 1204    Clinical Impression Statement  Tx. focus as noted for cervical/upper trapezius region. Very tight right upper trapezius region so tx. focus to address with STM, stretches and brief dry needling. Good  post-tx. response for decreased reported muscle tension.    PT Frequency  2x / week    PT Duration  4 weeks    PT Treatment/Interventions  ADLs/Self Care Home Management;Functional mobility training;Neuromuscular re-education;Passive range of motion;Manual techniques;Moist Heat;Gait training;DME Instruction;Therapeutic activities;Therapeutic exercise;Patient/family education;Cryotherapy;Electrical Stimulation;Balance training;Stair training;Dry needling;Cognitive remediation    PT Next Visit Plan  cont trunk, limb flexibility. dry needling-plan focus right hip next sessoin    PT Home Exercise Plan  SLS, tandem stance , piriformis stretch (IR).  Has some exercises that she does given to her from previous episode.      Consulted and Agree with Plan of Care  Patient       Patient will benefit from skilled therapeutic intervention in order to improve the following deficits and impairments:  Abnormal gait, Decreased balance, Decreased endurance, Decreased mobility, Difficulty walking, Hypomobility, Increased muscle spasms, Impaired sensation, Obesity, Improper body mechanics, Impaired tone, Decreased range of motion, Cardiopulmonary status limiting activity, Decreased activity tolerance, Decreased safety awareness, Decreased strength, Increased fascial restricitons, Impaired flexibility, Postural dysfunction, Pain  Visit Diagnosis: Other abnormalities of gait and mobility  Muscle weakness (generalized)  Cervicalgia     Problem List Patient Active Problem List   Diagnosis Date Noted  . OSA (obstructive sleep apnea) 10/16/2017  . Cervical stenosis of spinal canal 10/23/2016  . Neck swelling 09/28/2011  . Weight gain 01/02/2011  . Carpal tunnel syndrome 11/02/2010  . Leg swelling 08/26/2010  . ADRENAL MASS, LEFT 05/23/2010  . SPINAL STENOSIS, LUMBAR 12/27/2009  . B12 DEFICIENCY 06/10/2009  . ANEMIA 06/10/2009  . BACK PAIN, LUMBAR 11/06/2008  . KNEE PAIN, LEFT 02/03/2008  . DIABETES  MELLITUS, TYPE II, UNCONTROLLED 01/12/2008  . MENOPAUSE-RELATED VASOMOTOR SYMPTOMS, HOT FLASHES 06/23/2007  . BURSITIS, LEFT HIP 06/23/2007  . HYPERLIPIDEMIA 03/25/2007  . ANXIETY 03/25/2007  . DEPRESSION 03/25/2007  . PERIPHERAL NEUROPATHY 03/25/2007  . HYPERTENSION 03/25/2007  . CORONARY ARTERY DISEASE 03/25/2007  . DIVERTICULOSIS, COLON 03/25/2007  . TUBULOVILLOUS ADENOMA, COLON 02/18/2007    Beaulah Dinning, PT, DPT 02/18/18 12:07 PM  [facility] [address] [place], [name], [address] Phone: [phone]   Fax:  [phone]  Name: [name] Bean MRN: [id number] Date of Birth: 05/25/1950

## 2018-02-23 ENCOUNTER — Ambulatory Visit: Payer: Medicare Other | Admitting: Physical Therapy

## 2018-02-23 ENCOUNTER — Encounter: Payer: Self-pay | Admitting: Physical Therapy

## 2018-02-23 DIAGNOSIS — R2689 Other abnormalities of gait and mobility: Secondary | ICD-10-CM | POA: Diagnosis not present

## 2018-02-23 DIAGNOSIS — M542 Cervicalgia: Secondary | ICD-10-CM | POA: Diagnosis not present

## 2018-02-23 DIAGNOSIS — M6281 Muscle weakness (generalized): Secondary | ICD-10-CM | POA: Diagnosis not present

## 2018-02-23 NOTE — Therapy (Signed)
[place], [name], [address] Phone: [phone]   Fax:  [phone]  Physical Therapy Treatment/Renewal  Patient Details  Name: [name] Bean MRN: [id number] Date of Birth: 1951/03/13 Referring Provider (PT): Dr. Ashok Pall    Encounter Date: 02/23/2018  PT End of Session - 02/23/18 1440    Visit Number  11    Number of Visits  --    Date for PT Re-Evaluation  04/01/18    PT Start Time  0923    PT Stop Time  1520    PT Time Calculation (min)  43 min    Activity Tolerance  Patient tolerated treatment well    Behavior During Therapy  [facility] for tasks assessed/performed       Past Medical History:  Diagnosis Date  . Anemia   . Anxiety   . Arthritis   . Asthma   . Broken arm    left arm  . Bronchitis   . Bursitis of left hip   . CAD (coronary artery disease)   . Candidiasis, vagina   . Cardiac arrest ([place]) 06/20/2016   at [facility]; ? due to flash pulm edema vs undertreated chronic CHF  . Cerumen impaction   . CHF (congestive heart failure) ([place])    RECENT ADMIT TO [facility]   . Colon, diverticulosis   . Depression   . Diabetes mellitus    15 YRS AGO  . Fatigue   . Gastroenteritis   . Hot flashes, menopausal   . Hyperlipidemia   . Knee pain, left   . Lumbar back pain   . Maxillary sinusitis    history  . Muscle tear    right gluteus  . Nausea   . OSA (obstructive sleep apnea) 10/16/2017  . Other B-complex deficiencies   . Otitis media, acute    left  . Peripheral neuropathy   . Spinal stenosis of lumbar region   . Tubulovillous adenoma of colon   . Unspecified essential hypertension     Past Surgical History:  Procedure Laterality Date  . CARDIAC CATHETERIZATION  2015, 2009, 2006  . CERVICAL SPINE SURGERY  2003  . CORONARY ANGIOPLASTY WITH STENT PLACEMENT  2002, 2015  . EYE SURGERY     cataracts bilaterally  . POSTERIOR CERVICAL LAMINECTOMY N/A 10/23/2016   Procedure:  POSTERIOR CERVICAL LAMINECTOMY MULTI LEVEL CERVICAL TWO- CERVICAL THREE, CERVICAL THREE- CERVICAL FOUR;  Surgeon: Ashok Pall, MD;  Location: [facility];  Service: Neurosurgery;  Laterality: N/A;  POSTERIOR    There were no vitals filed for this visit.  Subjective Assessment - 02/23/18 1442    Subjective  I have alot of bounce in my leg and when I walked.  Its cold! The needling didnt really help my neck as much as it did my hip.      Pertinent History  pulmonary , CHF, diabetes, cervical myelopathy with surgery (laminectomy c2-C3-C4) 10/2016.  History of cardiac cath, stent . hip bursitis, sleep apnea. 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      Currently in Pain?  Yes    Pain Score  6     Pain Location  Hip    Pain Orientation  Right;Posterior;Lateral    Pain Descriptors / Indicators  Sore;Tightness    Pain Type  Chronic pain  Pain Onset  More than a month ago    Pain Frequency  Intermittent    Aggravating Factors   sit to stand, walking     Pain Relieving Factors  rest, heat , massage, DN     Effect of Pain on Daily Activities  limits mobility, walking          Pilates Springboard  for LE/Core strength, postural strength, lumbopelvic disassociation and core control.  Exercises included: Supine Leg Springs  Single leg arcs and circles yellow x 10   Double leg squats, scissors  Focus on control of springs and pelvic stability.    Arm Springs  Yellow Arcs x 20   Add in LE march alternating   Bridge with arm arcs x 10, 2 sets      [facility] Adult PT Treatment/Exercise - 02/23/18 0001      Pilates   Other Pilates  Spring board see note       Manual Therapy   Soft tissue mobilization  Rt piriformis, glute max, medius    Passive ROM  Rt/ LT hip ER/IR               PT Long Term Goals  - 02/23/18 1525      PT LONG TERM GOAL #1   Title  Pt will be able to walk in the community as needed (with cart) and decreased Rt LE spasms, pain for shopping.     Baseline  anyplace that has a cart I can walk.  Pain improved especially after dry needling to hip     Status  Partially Met      PT LONG TERM GOAL #2   Title  Pt will safely negotiate 12 or more stairs in the community with confidence and min to no fatigue, pain in hips.     Status  On-going      PT LONG TERM GOAL #3   Title  Pt will be able to be I with HEP for cervical ROM, UE and LE strength    Baseline  up to date     Status  On-going      PT LONG TERM GOAL #4   Title  Pt will increase cervical AROM by 10 or more degrees in each direction, no lasting pain for improved driving, travel.     Status  On-going      PT LONG TERM GOAL #5   Title  Pt will be able to use her LUE for a small portion (25%) of her ADLs due to increased ROM and less pain.     Status  On-going      PT LONG TERM GOAL #6   Title  Pt will transition [facility]    Status  On-going      PT LONG TERM GOAL #7   Title  Pt will get up from the floor unassisted as needed for safety with travel and at home.     Status  On-going            Plan - 02/23/18 1522    Clinical Impression Statement  Patient able to cross legs in supine and get up without assistance after supine Springboard exercises.  She is benefitting from PT in particular dry needling.  She was having increased ataxia with gait recently.  Many goals not met due to not attempted, difficulty with stairs.  Will cont PT to address ROM, strength to maintain functional gains.  Clinical Presentation  Unstable    Clinical Decision Making  High    Rehab Potential  Good    PT Frequency  2x / week    PT Duration  4 weeks    PT Treatment/Interventions  ADLs/Self Care Home Management;Functional mobility training;Neuromuscular  re-education;Passive range of motion;Manual techniques;Moist Heat;Gait training;DME Instruction;Therapeutic activities;Therapeutic exercise;Patient/family education;Cryotherapy;Electrical Stimulation;Balance training;Stair training;Dry needling;Cognitive remediation    PT Next Visit Plan  cont trunk, limb flexibility. dry needling-plan focus right hip next sessoin    PT Home Exercise Plan  SLS, tandem stance , piriformis stretch (IR).  Has some exercises that she does given to her from previous episode.      Consulted and Agree with Plan of Care  Patient       Patient will benefit from skilled therapeutic intervention in order to improve the following deficits and impairments:  Abnormal gait, Decreased balance, Decreased endurance, Decreased mobility, Difficulty walking, Hypomobility, Increased muscle spasms, Impaired sensation, Obesity, Improper body mechanics, Impaired tone, Decreased range of motion, Cardiopulmonary status limiting activity, Decreased activity tolerance, Decreased safety awareness, Decreased strength, Increased fascial restricitons, Impaired flexibility, Postural dysfunction, Pain  Visit Diagnosis: Other abnormalities of gait and mobility  Muscle weakness (generalized)  Cervicalgia     Problem List Patient Active Problem List   Diagnosis Date Noted  . OSA (obstructive sleep apnea) 10/16/2017  . Cervical stenosis of spinal canal 10/23/2016  . Neck swelling 09/28/2011  . Weight gain 01/02/2011  . Carpal tunnel syndrome 11/02/2010  . Leg swelling 08/26/2010  . ADRENAL MASS, LEFT 05/23/2010  . SPINAL STENOSIS, LUMBAR 12/27/2009  . B12 DEFICIENCY 06/10/2009  . ANEMIA 06/10/2009  . BACK PAIN, LUMBAR 11/06/2008  . KNEE PAIN, LEFT 02/03/2008  . DIABETES MELLITUS, TYPE II, UNCONTROLLED 01/12/2008  . MENOPAUSE-RELATED VASOMOTOR SYMPTOMS, HOT FLASHES 06/23/2007  . BURSITIS, LEFT HIP 06/23/2007  . HYPERLIPIDEMIA 03/25/2007  . ANXIETY 03/25/2007  . DEPRESSION 03/25/2007   . PERIPHERAL NEUROPATHY 03/25/2007  . HYPERTENSION 03/25/2007  . CORONARY ARTERY DISEASE 03/25/2007  . DIVERTICULOSIS, COLON 03/25/2007  . TUBULOVILLOUS ADENOMA, COLON 02/18/2007    PAA,JENNIFER 02/23/2018, 3:32 PM  [facility] [address] [place], [name], [address] Phone: [phone]   Fax:  [phone]  Name: [name] Bean MRN: [id number] Date of Birth: [birth year]  Raeford Razor, PT 02/23/18 3:32 PM Phone: [phone] Fax: [phone]

## 2018-02-24 ENCOUNTER — Encounter: Payer: Self-pay | Admitting: Physical Therapy

## 2018-02-24 ENCOUNTER — Ambulatory Visit: Payer: Medicare Other | Admitting: Physical Therapy

## 2018-02-24 DIAGNOSIS — R2689 Other abnormalities of gait and mobility: Secondary | ICD-10-CM | POA: Diagnosis not present

## 2018-02-24 DIAGNOSIS — M542 Cervicalgia: Secondary | ICD-10-CM | POA: Diagnosis not present

## 2018-02-24 DIAGNOSIS — M6281 Muscle weakness (generalized): Secondary | ICD-10-CM

## 2018-02-24 NOTE — Therapy (Signed)
[place], [name], [address] Phone: [phone]   Fax:  [phone]  Physical Therapy Treatment  Patient Details  Name: [name] Bean MRN: [id number] Date of Birth: March 18, 1951 Referring Provider (PT): Dr. Ashok Pall    Encounter Date: 02/24/2018  PT End of Session - 02/24/18 1125    Visit Number  12    Number of Visits  19    Date for PT Re-Evaluation  04/01/18    PT Start Time  1016    PT Stop Time  1105    PT Time Calculation (min)  49 min    Activity Tolerance  Patient tolerated treatment well    Behavior During Therapy  [facility] for tasks assessed/performed       Past Medical History:  Diagnosis Date  . Anemia   . Anxiety   . Arthritis   . Asthma   . Broken arm    left arm  . Bronchitis   . Bursitis of left hip   . CAD (coronary artery disease)   . Candidiasis, vagina   . Cardiac arrest ([place]) 06/20/2016   at [facility]; ? due to flash pulm edema vs undertreated chronic CHF  . Cerumen impaction   . CHF (congestive heart failure) ([place])    RECENT ADMIT TO [facility]   . Colon, diverticulosis   . Depression   . Diabetes mellitus    15 YRS AGO  . Fatigue   . Gastroenteritis   . Hot flashes, menopausal   . Hyperlipidemia   . Knee pain, left   . Lumbar back pain   . Maxillary sinusitis    history  . Muscle tear    right gluteus  . Nausea   . OSA (obstructive sleep apnea) 10/16/2017  . Other B-complex deficiencies   . Otitis media, acute    left  . Peripheral neuropathy   . Spinal stenosis of lumbar region   . Tubulovillous adenoma of colon   . Unspecified essential hypertension     Past Surgical History:  Procedure Laterality Date  . CARDIAC CATHETERIZATION  2015, 2009, 2006  . CERVICAL SPINE SURGERY  2003  . CORONARY ANGIOPLASTY WITH STENT PLACEMENT  2002, 2015  . EYE SURGERY     cataracts bilaterally  . POSTERIOR CERVICAL LAMINECTOMY N/A 10/23/2016   Procedure: POSTERIOR  CERVICAL LAMINECTOMY MULTI LEVEL CERVICAL TWO- CERVICAL THREE, CERVICAL THREE- CERVICAL FOUR;  Surgeon: Ashok Pall, MD;  Location: [facility];  Service: Neurosurgery;  Laterality: N/A;  POSTERIOR    There were no vitals filed for this visit.  Subjective Assessment - 02/24/18 1116    Subjective  Tx. for hip today. No new complaints/concerns since visit yesterday. Pt. will be leaving for trip to [place] tomorrow with more demands for standing/walking.                       [facility] Adult PT Treatment/Exercise - 02/24/18 0001      Knee/Hip Exercises: Stretches   Passive Hamstring Stretch  Right;3 reps;30 seconds    ITB Stretch  Right;3 reps;30 seconds    Piriformis Stretch  Right;3 reps;30 seconds      Knee/Hip Exercises: Supine   Bridges  1 set;10 reps    Other Supine Knee/Hip Exercises  supine clamshell Green Theraband x 20      Manual Therapy   Manual Therapy  Joint mobilization;Soft tissue mobilization    Joint Mobilization  R hip long axis distraction  grade I-III    Soft tissue mobilization  Right piriformis, gluteus medius/minimus incl. IASTM       Trigger Point Dry Needling - 02/24/18 1120    Consent Given?  Yes    Muscles Treated Lower Body  Gluteus minimus;Piriformis;Tensor fascia lata   Gluteus medius/minimus, TFL, and piriformis x 6 min total          PT Education - 02/24/18 1121    Education Details  POC    Person(s) Educated  Patient    Methods  Explanation    Comprehension  Verbalized understanding          PT Long Term Goals - 02/23/18 1525      PT LONG TERM GOAL #1   Title  Pt will be able to walk in the community as needed (with cart) and decreased Rt LE spasms, pain for shopping.     Baseline  anyplace that has a cart I can walk.  Pain improved especially after dry needling to hip     Status  Partially Met      PT LONG TERM GOAL #2   Title  Pt will safely negotiate 12 or more stairs in the community with confidence and min to no  fatigue, pain in hips.     Status  On-going      PT LONG TERM GOAL #3   Title  Pt will be able to be I with HEP for cervical ROM, UE and LE strength    Baseline  up to date     Status  On-going      PT LONG TERM GOAL #4   Title  Pt will increase cervical AROM by 10 or more degrees in each direction, no lasting pain for improved driving, travel.     Status  On-going      PT LONG TERM GOAL #5   Title  Pt will be able to use her LUE for a small portion (25%) of her ADLs due to increased ROM and less pain.     Status  On-going      PT LONG TERM GOAL #6   Title  Pt will [facility]    Status  On-going      PT LONG TERM GOAL #7   Title  Pt will get up from the floor unassisted as needed for safety with travel and at home.     Status  On-going            Plan - 02/24/18 1126    Clinical Impression Statement  Tx. for right hip today. Still with a fair amount of soft tissue restriction in right piriformis and gluteus medius region. Gradually improving with strength and flexibility with associated functional gains for mobility.    PT Frequency  2x / week    PT Duration  4 weeks    PT Treatment/Interventions  ADLs/Self Care Home Management;Functional mobility training;Neuromuscular re-education;Passive range of motion;Manual techniques;Moist Heat;Gait training;DME Instruction;Therapeutic activities;Therapeutic exercise;Patient/family education;Cryotherapy;Electrical Stimulation;Balance training;Stair training;Dry needling;Cognitive remediation    PT Next Visit Plan  cont trunk, limb flexibility, further dry needling prn    PT Home Exercise Plan  SLS, tandem stance , piriformis stretch (IR).  Has some exercises that she does given to her from previous episode.      Consulted and Agree with Plan of Care  Patient       Patient will benefit from skilled therapeutic intervention in order to improve  the following deficits and  impairments:  Abnormal gait, Decreased balance, Decreased endurance, Decreased mobility, Difficulty walking, Hypomobility, Increased muscle spasms, Impaired sensation, Obesity, Improper body mechanics, Impaired tone, Decreased range of motion, Cardiopulmonary status limiting activity, Decreased activity tolerance, Decreased safety awareness, Decreased strength, Increased fascial restricitons, Impaired flexibility, Postural dysfunction, Pain  Visit Diagnosis: Other abnormalities of gait and mobility  Muscle weakness (generalized)  Cervicalgia     Problem List Patient Active Problem List   Diagnosis Date Noted  . OSA (obstructive sleep apnea) 10/16/2017  . Cervical stenosis of spinal canal 10/23/2016  . Neck swelling 09/28/2011  . Weight gain 01/02/2011  . Carpal tunnel syndrome 11/02/2010  . Leg swelling 08/26/2010  . ADRENAL MASS, LEFT 05/23/2010  . SPINAL STENOSIS, LUMBAR 12/27/2009  . B12 DEFICIENCY 06/10/2009  . ANEMIA 06/10/2009  . BACK PAIN, LUMBAR 11/06/2008  . KNEE PAIN, LEFT 02/03/2008  . DIABETES MELLITUS, TYPE II, UNCONTROLLED 01/12/2008  . MENOPAUSE-RELATED VASOMOTOR SYMPTOMS, HOT FLASHES 06/23/2007  . BURSITIS, LEFT HIP 06/23/2007  . HYPERLIPIDEMIA 03/25/2007  . ANXIETY 03/25/2007  . DEPRESSION 03/25/2007  . PERIPHERAL NEUROPATHY 03/25/2007  . HYPERTENSION 03/25/2007  . CORONARY ARTERY DISEASE 03/25/2007  . DIVERTICULOSIS, COLON 03/25/2007  . TUBULOVILLOUS ADENOMA, COLON 02/18/2007    Beaulah Dinning, PT, DPT 02/24/18 11:32 AM  [facility] [address] [place], [name], [address] Phone: [phone]   Fax:  [phone]  Name: [name] Bean MRN: [id number] Date of Birth: 1950-07-15

## 2018-03-08 ENCOUNTER — Encounter: Payer: Self-pay | Admitting: Physical Therapy

## 2018-03-08 ENCOUNTER — Ambulatory Visit: Payer: Medicare Other | Admitting: Physical Therapy

## 2018-03-08 DIAGNOSIS — M6281 Muscle weakness (generalized): Secondary | ICD-10-CM | POA: Diagnosis not present

## 2018-03-08 DIAGNOSIS — M542 Cervicalgia: Secondary | ICD-10-CM

## 2018-03-08 DIAGNOSIS — R2689 Other abnormalities of gait and mobility: Secondary | ICD-10-CM | POA: Diagnosis not present

## 2018-03-08 NOTE — Therapy (Signed)
[place], [name], [address] Phone: [phone]   Fax:  [phone]  Physical Therapy Treatment  Patient Details  Name: [name] Bean MRN: [id number] Date of Birth: [birth year] Referring Provider (PT): Dr. Ashok Pall    Encounter Date: 03/08/2018  PT End of Session - 03/08/18 1557    Visit Number  13    Number of Visits  19    Date for PT Re-Evaluation  04/01/18    PT Start Time  1346    PT Stop Time  1416    PT Time Calculation (min)  30 min    Activity Tolerance  Patient tolerated treatment well    Behavior During Therapy  [facility] for tasks assessed/performed       Past Medical History:  Diagnosis Date  . Anemia   . Anxiety   . Arthritis   . Asthma   . Broken arm    left arm  . Bronchitis   . Bursitis of left hip   . CAD (coronary artery disease)   . Candidiasis, vagina   . Cardiac arrest ([place]) 06/20/2016   at [facility]; ? due to flash pulm edema vs undertreated chronic CHF  . Cerumen impaction   . CHF (congestive heart failure) ([place])    RECENT ADMIT TO [facility]   . Colon, diverticulosis   . Depression   . Diabetes mellitus    15 YRS AGO  . Fatigue   . Gastroenteritis   . Hot flashes, menopausal   . Hyperlipidemia   . Knee pain, left   . Lumbar back pain   . Maxillary sinusitis    history  . Muscle tear    right gluteus  . Nausea   . OSA (obstructive sleep apnea) 10/16/2017  . Other B-complex deficiencies   . Otitis media, acute    left  . Peripheral neuropathy   . Spinal stenosis of lumbar region   . Tubulovillous adenoma of colon   . Unspecified essential hypertension     Past Surgical History:  Procedure Laterality Date  . CARDIAC CATHETERIZATION  2015, 2009, 2006  . CERVICAL SPINE SURGERY  2003  . CORONARY ANGIOPLASTY WITH STENT PLACEMENT  2002, 2015  . EYE SURGERY     cataracts bilaterally  . POSTERIOR CERVICAL LAMINECTOMY N/A 10/23/2016   Procedure: POSTERIOR  CERVICAL LAMINECTOMY MULTI LEVEL CERVICAL TWO- CERVICAL THREE, CERVICAL THREE- CERVICAL FOUR;  Surgeon: Ashok Pall, MD;  Location: [place];  Service: Neurosurgery;  Laterality: N/A;  POSTERIOR    There were no vitals filed for this visit.  Subjective Assessment - 03/08/18 1550    Subjective  16 min late to start tx. due to pt. running late so session abbreviated. Pt. returns from recent travel to [place]. She reports her hip flared up again over the past 2 days without clear exacerbating cause. Did OK in [place] with hip though used a wheelchair for more prolonged distances.    Currently in Pain?  Yes    Pain Score  7     Pain Location  Hip    Pain Orientation  Right;Lateral;Posterior    Pain Descriptors / Indicators  Sore;Tightness    Pain Type  Chronic pain    Pain Radiating Towards  Right buttock    Pain Onset  More than a month ago    Pain Frequency  Intermittent    Aggravating Factors   sit>stand, walking    Pain Relieving Factors  rest, heat,  massage, DN    Effect of Pain on Daily Activities  limits walking tolerance    Multiple Pain Sites  Yes    Pain Score  4    Pain Location  Neck    Pain Orientation  Right    Pain Descriptors / Indicators  Aching    Pain Type  Chronic pain    Pain Radiating Towards  Right upper trapezius    Pain Onset  More than a month ago    Pain Frequency  Intermittent    Aggravating Factors   no aggravating factors noted    Pain Relieving Factors  rest, heat    Effect of Pain on Daily Activities  difficulty with lifting activities                       [facility] Adult PT Treatment/Exercise - 03/08/18 0001      Knee/Hip Exercises: Stretches   ITB Stretch  Right;3 reps;30 seconds    Piriformis Stretch  Right;3 reps;30 seconds      Knee/Hip Exercises: Supine   Bridges  Strengthening;Both;1 set;15 reps    Other Supine Knee/Hip Exercises  supine clamshell greem 2x10      Manual Therapy   Manual Therapy  Soft tissue mobilization     Soft tissue mobilization  Right gluteus medius, TFL, and piriformis       Trigger Point Dry Needling - 03/08/18 1556    Consent Given?  Yes    Muscles Treated Lower Body  Gluteus minimus;Piriformis;Tensor fascia lata   incl. gluteus medius, needling x 6 min total          PT Education - 03/08/18 1557    Education Details  POC    Person(s) Educated  Patient    Methods  Explanation    Comprehension  Verbalized understanding          PT Long Term Goals - 02/23/18 1525      PT LONG TERM GOAL #1   Title  Pt will be able to walk in the community as needed (with cart) and decreased Rt LE spasms, pain for shopping.     Baseline  anyplace that has a cart I can walk.  Pain improved especially after dry needling to hip     Status  Partially Met      PT LONG TERM GOAL #2   Title  Pt will safely negotiate 12 or more stairs in the community with confidence and min to no fatigue, pain in hips.     Status  On-going      PT LONG TERM GOAL #3   Title  Pt will be able to be I with HEP for cervical ROM, UE and LE strength    Baseline  up to date     Status  On-going      PT LONG TERM GOAL #4   Title  Pt will increase cervical AROM by 10 or more degrees in each direction, no lasting pain for improved driving, travel.     Status  On-going      PT LONG TERM GOAL #5   Title  Pt will be able to use her LUE for a small portion (25%) of her ADLs due to increased ROM and less pain.     Status  On-going      PT LONG TERM GOAL #6   Title  Pt will transition [facility]  Status  On-going      PT LONG TERM GOAL #7   Title  Pt will get up from the floor unassisted as needed for safety with travel and at home.     Status  On-going            Plan - 03/08/18 1600    Clinical Impression Statement  Limited tx. time due to tardiness. Siginficant posterolateral hip tightness and soft tissue restriction noted. Suspect gait mechanics and  contributing factors from hip weakness/lack of proximal stability. Pt. would benefit from continued PT for further progress to address functional limitations for mobility.    PT Frequency  2x / week    PT Duration  4 weeks    PT Treatment/Interventions  ADLs/Self Care Home Management;Functional mobility training;Neuromuscular re-education;Passive range of motion;Manual techniques;Moist Heat;Gait training;DME Instruction;Therapeutic activities;Therapeutic exercise;Patient/family education;Cryotherapy;Electrical Stimulation;Balance training;Stair training;Dry needling;Cognitive remediation    PT Next Visit Plan  cont trunk, limb flexibility, further dry needling prn    PT Home Exercise Plan  SLS, tandem stance , piriformis stretch (IR).  Has some exercises that she does given to her from previous episode.      Consulted and Agree with Plan of Care  Patient       Patient will benefit from skilled therapeutic intervention in order to improve the following deficits and impairments:  Abnormal gait, Decreased balance, Decreased endurance, Decreased mobility, Difficulty walking, Hypomobility, Increased muscle spasms, Impaired sensation, Obesity, Improper body mechanics, Impaired tone, Decreased range of motion, Cardiopulmonary status limiting activity, Decreased activity tolerance, Decreased safety awareness, Decreased strength, Increased fascial restricitons, Impaired flexibility, Postural dysfunction, Pain  Visit Diagnosis: Other abnormalities of gait and mobility  Muscle weakness (generalized)  Cervicalgia     Problem List Patient Active Problem List   Diagnosis Date Noted  . OSA (obstructive sleep apnea) 10/16/2017  . Cervical stenosis of spinal canal 10/23/2016  . Neck swelling 09/28/2011  . Weight gain 01/02/2011  . Carpal tunnel syndrome 11/02/2010  . Leg swelling 08/26/2010  . ADRENAL MASS, LEFT 05/23/2010  . SPINAL STENOSIS, LUMBAR 12/27/2009  . B12 DEFICIENCY 06/10/2009  . ANEMIA  06/10/2009  . BACK PAIN, LUMBAR 11/06/2008  . KNEE PAIN, LEFT 02/03/2008  . DIABETES MELLITUS, TYPE II, UNCONTROLLED 01/12/2008  . MENOPAUSE-RELATED VASOMOTOR SYMPTOMS, HOT FLASHES 06/23/2007  . BURSITIS, LEFT HIP 06/23/2007  . HYPERLIPIDEMIA 03/25/2007  . ANXIETY 03/25/2007  . DEPRESSION 03/25/2007  . PERIPHERAL NEUROPATHY 03/25/2007  . HYPERTENSION 03/25/2007  . CORONARY ARTERY DISEASE 03/25/2007  . DIVERTICULOSIS, COLON 03/25/2007  . TUBULOVILLOUS ADENOMA, COLON 02/18/2007   Beaulah Dinning, PT, DPT 03/08/18 4:03 PM  [facility] [address] [place], [name], [address] Phone: [phone]   Fax:  [phone]  Name: [name] Bean MRN: [id number] Date of Birth: 14-Oct-1950

## 2018-03-15 ENCOUNTER — Encounter: Payer: Self-pay | Admitting: Physical Therapy

## 2018-03-15 ENCOUNTER — Ambulatory Visit: Payer: Medicare Other | Attending: Neurosurgery | Admitting: Physical Therapy

## 2018-03-15 DIAGNOSIS — R2689 Other abnormalities of gait and mobility: Secondary | ICD-10-CM | POA: Insufficient documentation

## 2018-03-15 DIAGNOSIS — M542 Cervicalgia: Secondary | ICD-10-CM | POA: Insufficient documentation

## 2018-03-15 DIAGNOSIS — M6281 Muscle weakness (generalized): Secondary | ICD-10-CM | POA: Insufficient documentation

## 2018-03-15 NOTE — Therapy (Signed)
[place], [name], [address] Phone: [phone]   Fax:  [phone]  Physical Therapy Treatment  Patient Details  Name: [name] Bean MRN: [id number] Date of Birth: 05/01/1950 Referring Provider (PT): Dr. Ashok Pall    Encounter Date: 03/15/2018    Past Medical History:  Diagnosis Date  . Anemia   . Anxiety   . Arthritis   . Asthma   . Broken arm    left arm  . Bronchitis   . Bursitis of left hip   . CAD (coronary artery disease)   . Candidiasis, vagina   . Cardiac arrest ([place]) 06/20/2016   at [facility]; ? due to flash pulm edema vs undertreated chronic CHF  . Cerumen impaction   . CHF (congestive heart failure) ([place])    RECENT ADMIT TO [facility]   . Colon, diverticulosis   . Depression   . Diabetes mellitus    15 YRS AGO  . Fatigue   . Gastroenteritis   . Hot flashes, menopausal   . Hyperlipidemia   . Knee pain, left   . Lumbar back pain   . Maxillary sinusitis    history  . Muscle tear    right gluteus  . Nausea   . OSA (obstructive sleep apnea) 10/16/2017  . Other B-complex deficiencies   . Otitis media, acute    left  . Peripheral neuropathy   . Spinal stenosis of lumbar region   . Tubulovillous adenoma of colon   . Unspecified essential hypertension     Past Surgical History:  Procedure Laterality Date  . CARDIAC CATHETERIZATION  2015, 2009, 2006  . CERVICAL SPINE SURGERY  2003  . CORONARY ANGIOPLASTY WITH STENT PLACEMENT  2002, 2015  . EYE SURGERY     cataracts bilaterally  . POSTERIOR CERVICAL LAMINECTOMY N/A 10/23/2016   Procedure: POSTERIOR CERVICAL LAMINECTOMY MULTI LEVEL CERVICAL TWO- CERVICAL THREE, CERVICAL THREE- CERVICAL FOUR;  Surgeon: Ashok Pall, MD;  Location: [place];  Service: Neurosurgery;  Laterality: N/A;  POSTERIOR    There were no vitals filed for this visit.  Subjective Assessment - 03/15/18 1344    Subjective  --      Patient arrived and was  not seen. She complained of a headache and nausea.  Rescheduled session.             PT Long Term Goals - 02/23/18 1525      PT LONG TERM GOAL #1   Title  Pt will be able to walk in the community as needed (with cart) and decreased Rt LE spasms, pain for shopping.     Baseline  anyplace that has a cart I can walk.  Pain improved especially after dry needling to hip     Status  Partially Met      PT LONG TERM GOAL #2   Title  Pt will safely negotiate 12 or more stairs in the community with confidence and min to no fatigue, pain in hips.     Status  On-going      PT LONG TERM GOAL #3   Title  Pt will be able to be I with HEP for cervical ROM, UE and LE strength    Baseline  up to date     Status  On-going      PT LONG TERM GOAL #4   Title  Pt will increase cervical AROM by 10 or more degrees in each direction, no lasting pain for improved driving,  travel.     Status  On-going      PT LONG TERM GOAL #5   Title  Pt will be able to use her LUE for a small portion (25%) of her ADLs due to increased ROM and less pain.     Status  On-going      PT LONG TERM GOAL #6   Title  Pt will transition to a [facility]    Status  On-going      PT LONG TERM GOAL #7   Title  Pt will get up from the floor unassisted as needed for safety with travel and at home.     Status  On-going              Patient will benefit from skilled therapeutic intervention in order to improve the following deficits and impairments:     Visit Diagnosis: Other abnormalities of gait and mobility  Muscle weakness (generalized)  Cervicalgia     Problem List Patient Active Problem List   Diagnosis Date Noted  . OSA (obstructive sleep apnea) 10/16/2017  . Cervical stenosis of spinal canal 10/23/2016  . Neck swelling 09/28/2011  . Weight gain 01/02/2011  . Carpal tunnel syndrome 11/02/2010  . Leg swelling 08/26/2010  . ADRENAL MASS, LEFT 05/23/2010   . SPINAL STENOSIS, LUMBAR 12/27/2009  . B12 DEFICIENCY 06/10/2009  . ANEMIA 06/10/2009  . BACK PAIN, LUMBAR 11/06/2008  . KNEE PAIN, LEFT 02/03/2008  . DIABETES MELLITUS, TYPE II, UNCONTROLLED 01/12/2008  . MENOPAUSE-RELATED VASOMOTOR SYMPTOMS, HOT FLASHES 06/23/2007  . BURSITIS, LEFT HIP 06/23/2007  . HYPERLIPIDEMIA 03/25/2007  . ANXIETY 03/25/2007  . DEPRESSION 03/25/2007  . PERIPHERAL NEUROPATHY 03/25/2007  . HYPERTENSION 03/25/2007  . CORONARY ARTERY DISEASE 03/25/2007  . DIVERTICULOSIS, COLON 03/25/2007  . TUBULOVILLOUS ADENOMA, COLON 02/18/2007    Rosezella Kronick 03/15/2018, 1:59 PM  [facility] [address] [place], [name], [address] Phone: [phone]   Fax:  [phone]  Name: [name] Bean MRN: [id number] Date of Birth: 07-16-1950   Raeford Razor, PT 03/15/18 2:00 PM Phone: [phone] Fax: [phone]

## 2018-03-16 ENCOUNTER — Ambulatory Visit: Payer: BC Managed Care – PPO | Admitting: Family Medicine

## 2018-03-16 ENCOUNTER — Encounter: Payer: Medicare Other | Admitting: Physical Therapy

## 2018-03-17 ENCOUNTER — Ambulatory Visit: Payer: Medicare Other | Admitting: Physical Therapy

## 2018-03-18 ENCOUNTER — Ambulatory Visit: Payer: BC Managed Care – PPO

## 2018-03-21 ENCOUNTER — Other Ambulatory Visit: Payer: Self-pay | Admitting: *Deleted

## 2018-03-21 ENCOUNTER — Ambulatory Visit (INDEPENDENT_AMBULATORY_CARE_PROVIDER_SITE_OTHER): Payer: Medicare Other | Admitting: *Deleted

## 2018-03-21 DIAGNOSIS — Z794 Long term (current) use of insulin: Principal | ICD-10-CM

## 2018-03-21 DIAGNOSIS — Z23 Encounter for immunization: Secondary | ICD-10-CM | POA: Diagnosis not present

## 2018-03-21 DIAGNOSIS — E1165 Type 2 diabetes mellitus with hyperglycemia: Secondary | ICD-10-CM

## 2018-03-21 MED ORDER — BASAGLAR KWIKPEN 100 UNIT/ML ~~LOC~~ SOPN: PEN_INJECTOR | SUBCUTANEOUS | 0 refills | Status: DC

## 2018-03-21 NOTE — Telephone Encounter (Signed)
Patient came in to the office today for her flu shot.  She requested a refill on Basaglar and this was sent with a note stating the pt needs an appt.

## 2018-03-24 ENCOUNTER — Ambulatory Visit: Payer: Medicare Other | Admitting: Physical Therapy

## 2018-03-24 ENCOUNTER — Encounter: Payer: Self-pay | Admitting: Physical Therapy

## 2018-03-24 DIAGNOSIS — R2689 Other abnormalities of gait and mobility: Secondary | ICD-10-CM

## 2018-03-24 DIAGNOSIS — M542 Cervicalgia: Secondary | ICD-10-CM

## 2018-03-24 DIAGNOSIS — M6281 Muscle weakness (generalized): Secondary | ICD-10-CM | POA: Diagnosis not present

## 2018-03-24 NOTE — Therapy (Signed)
[place], [name], [address] Phone: [phone]   Fax:  [phone]  Physical Therapy Treatment  Patient Details  Name: [name] Bean MRN: [id number] Date of Birth: [birth year] Referring Provider (PT): Dr. Ashok Pall    Encounter Date: 03/24/2018  PT End of Session - 03/24/18 1547    Visit Number  14    Number of Visits  19    Date for PT Re-Evaluation  04/01/18    PT Start Time  8453    PT Stop Time  1626    PT Time Calculation (min)  39 min    Activity Tolerance  Patient tolerated treatment well    Behavior During Therapy  [facility] for tasks assessed/performed       Past Medical History:  Diagnosis Date  . Anemia   . Anxiety   . Arthritis   . Asthma   . Broken arm    left arm  . Bronchitis   . Bursitis of left hip   . CAD (coronary artery disease)   . Candidiasis, vagina   . Cardiac arrest ([place]) 06/20/2016   at [facility]; ? due to flash pulm edema vs undertreated chronic CHF  . Cerumen impaction   . CHF (congestive heart failure) ([place])    RECENT ADMIT TO [facility]   . Colon, diverticulosis   . Depression   . Diabetes mellitus    15 YRS AGO  . Fatigue   . Gastroenteritis   . Hot flashes, menopausal   . Hyperlipidemia   . Knee pain, left   . Lumbar back pain   . Maxillary sinusitis    history  . Muscle tear    right gluteus  . Nausea   . OSA (obstructive sleep apnea) 10/16/2017  . Other B-complex deficiencies   . Otitis media, acute    left  . Peripheral neuropathy   . Spinal stenosis of lumbar region   . Tubulovillous adenoma of colon   . Unspecified essential hypertension     Past Surgical History:  Procedure Laterality Date  . CARDIAC CATHETERIZATION  2015, 2009, 2006  . CERVICAL SPINE SURGERY  2003  . CORONARY ANGIOPLASTY WITH STENT PLACEMENT  2002, 2015  . EYE SURGERY     cataracts bilaterally  . POSTERIOR CERVICAL LAMINECTOMY N/A 10/23/2016   Procedure: POSTERIOR  CERVICAL LAMINECTOMY MULTI LEVEL CERVICAL TWO- CERVICAL THREE, CERVICAL THREE- CERVICAL FOUR;  Surgeon: Ashok Pall, MD;  Location: [place];  Service: Neurosurgery;  Laterality: N/A;  POSTERIOR    There were no vitals filed for this visit.  Subjective Assessment - 03/24/18 1551    Subjective  I've been having increased soreness on the inside thigh on the R which has been going for a little. I am going out of town tomorrow.     Patient Stated Goals  Gain better movement.      Currently in Pain?  Yes    Pain Score  4     Pain Orientation  Right    Pain Descriptors / Indicators  Sore;Tightness    Pain Type  Chronic pain    Pain Onset  More than a month ago                       [facility] Adult PT Treatment/Exercise - 03/24/18 0001      Knee/Hip Exercises: Stretches   ITB Stretch  Right;3 reps;30 seconds    Piriformis Stretch  Right;3 reps;30 seconds  Knee/Hip Exercises: Sidelying   Clams  2 x 10 in L sidelying      Manual Therapy   Manual Therapy  Other (comment)    Manual therapy comments  skilled palpation and monitoring of pt throughout TPDN    Joint Mobilization  R hip long axis distraction grade I-III    Soft tissue mobilization  Right gluteus medius, TFL, and piriformis    Other Manual Therapy  MTPR along adductor longus x 2       Trigger Point Dry Needling - 03/24/18 1600    Consent Given?  Yes    Education Handout Provided  No    Gluteus Minimus Response  Twitch response elicited;Palpable increased muscle length   medius on the R   Tensor Fascia Lata Response  Twitch response elicited;Palpable increased muscle length                PT Long Term Goals - 02/23/18 1525      PT LONG TERM GOAL #1   Title  Pt will be able to walk in the community as needed (with cart) and decreased Rt LE spasms, pain for shopping.     Baseline  anyplace that has a cart I can walk.  Pain improved especially after dry needling to hip     Status  Partially Met       PT LONG TERM GOAL #2   Title  Pt will safely negotiate 12 or more stairs in the community with confidence and min to no fatigue, pain in hips.     Status  On-going      PT LONG TERM GOAL #3   Title  Pt will be able to be I with HEP for cervical ROM, UE and LE strength    Baseline  up to date     Status  On-going      PT LONG TERM GOAL #4   Title  Pt will increase cervical AROM by 10 or more degrees in each direction, no lasting pain for improved driving, travel.     Status  On-going      PT LONG TERM GOAL #5   Title  Pt will be able to use her LUE for a small portion (25%) of her ADLs due to increased ROM and less pain.     Status  On-going      PT LONG TERM GOAL #6   Title  Pt will transition [facility] a [facility]    Status  On-going      PT LONG TERM GOAL #7   Title  Pt will get up from the floor unassisted as needed for safety with travel and at home.     Status  On-going            Plan - 03/24/18 1625    Clinical Impression Statement  continued TPDN along the glute medius/ minimus and TFL followed with IASTM and hip mobs. continued gentle strengthening for muscle retraining. trialed MTPR along the adducotrs to calm down muscle tightness. no increase in pain at end of session.     PT Next Visit Plan  cont trunk, limb flexibility, further dry needling prn    PT Home Exercise Plan  SLS, tandem stance , piriformis stretch (IR).  Has some exercises that she does given to her from previous episode.      Consulted and Agree with Plan of Care  Patient       Patient  will benefit from skilled therapeutic intervention in order to improve the following deficits and impairments:  Abnormal gait, Decreased balance, Decreased endurance, Decreased mobility, Difficulty walking, Hypomobility, Increased muscle spasms, Impaired sensation, Obesity, Improper body mechanics, Impaired tone, Decreased range of motion, Cardiopulmonary status limiting  activity, Decreased activity tolerance, Decreased safety awareness, Decreased strength, Increased fascial restricitons, Impaired flexibility, Postural dysfunction, Pain  Visit Diagnosis: Other abnormalities of gait and mobility  Muscle weakness (generalized)  Cervicalgia     Problem List Patient Active Problem List   Diagnosis Date Noted  . OSA (obstructive sleep apnea) 10/16/2017  . Cervical stenosis of spinal canal 10/23/2016  . Neck swelling 09/28/2011  . Weight gain 01/02/2011  . Carpal tunnel syndrome 11/02/2010  . Leg swelling 08/26/2010  . ADRENAL MASS, LEFT 05/23/2010  . SPINAL STENOSIS, LUMBAR 12/27/2009  . B12 DEFICIENCY 06/10/2009  . ANEMIA 06/10/2009  . BACK PAIN, LUMBAR 11/06/2008  . KNEE PAIN, LEFT 02/03/2008  . DIABETES MELLITUS, TYPE II, UNCONTROLLED 01/12/2008  . MENOPAUSE-RELATED VASOMOTOR SYMPTOMS, HOT FLASHES 06/23/2007  . BURSITIS, LEFT HIP 06/23/2007  . HYPERLIPIDEMIA 03/25/2007  . ANXIETY 03/25/2007  . DEPRESSION 03/25/2007  . PERIPHERAL NEUROPATHY 03/25/2007  . HYPERTENSION 03/25/2007  . CORONARY ARTERY DISEASE 03/25/2007  . DIVERTICULOSIS, COLON 03/25/2007  . TUBULOVILLOUS ADENOMA, COLON 02/18/2007   Starr Lake PT, DPT, LAT, ATC  03/24/18  4:28 PM      [facility] [address] [place], [name], [address] Phone: [phone]   Fax:  [phone]  Name: Darrion Macaulay MRN: [id number] Date of Birth: [birth year]

## 2018-03-25 ENCOUNTER — Encounter

## 2018-03-28 ENCOUNTER — Other Ambulatory Visit: Payer: Self-pay

## 2018-03-28 DIAGNOSIS — E119 Type 2 diabetes mellitus without complications: Secondary | ICD-10-CM

## 2018-03-28 DIAGNOSIS — Z794 Long term (current) use of insulin: Principal | ICD-10-CM

## 2018-03-28 MED ORDER — INSULIN ASPART 100 UNIT/ML FLEXPEN: PEN_INJECTOR | SUBCUTANEOUS | 5 refills | Status: DC

## 2018-03-29 ENCOUNTER — Encounter: Payer: Self-pay | Admitting: Physical Therapy

## 2018-03-29 ENCOUNTER — Ambulatory Visit: Payer: Medicare Other | Admitting: Physical Therapy

## 2018-03-29 DIAGNOSIS — M542 Cervicalgia: Secondary | ICD-10-CM

## 2018-03-29 DIAGNOSIS — R2689 Other abnormalities of gait and mobility: Secondary | ICD-10-CM | POA: Diagnosis not present

## 2018-03-29 DIAGNOSIS — M6281 Muscle weakness (generalized): Secondary | ICD-10-CM | POA: Diagnosis not present

## 2018-03-29 NOTE — Therapy (Signed)
[place], [name], [address] Phone: [phone]   Fax:  [phone]  Physical Therapy Treatment/Discharge  Patient Details  Name: [name] Bean MRN: [id number] Date of Birth: 01-Jul-1950 Referring Provider (PT): Dr. Ashok Pall    Encounter Date: 03/29/2018  PT End of Session - 03/29/18 1328    Visit Number  15    Number of Visits  19    Date for PT Re-Evaluation  04/01/18    PT Start Time  1329    PT Stop Time  1412    PT Time Calculation (min)  43 min    Activity Tolerance  Patient tolerated treatment well    Behavior During Therapy  [facility] for tasks assessed/performed       Past Medical History:  Diagnosis Date  . Anemia   . Anxiety   . Arthritis   . Asthma   . Broken arm    left arm  . Bronchitis   . Bursitis of left hip   . CAD (coronary artery disease)   . Candidiasis, vagina   . Cardiac arrest ([place]) 06/20/2016   at [facility]; ? due to flash pulm edema vs undertreated chronic CHF  . Cerumen impaction   . CHF (congestive heart failure) ([place])    RECENT ADMIT TO [facility]   . Colon, diverticulosis   . Depression   . Diabetes mellitus    15 YRS AGO  . Fatigue   . Gastroenteritis   . Hot flashes, menopausal   . Hyperlipidemia   . Knee pain, left   . Lumbar back pain   . Maxillary sinusitis    history  . Muscle tear    right gluteus  . Nausea   . OSA (obstructive sleep apnea) 10/16/2017  . Other B-complex deficiencies   . Otitis media, acute    left  . Peripheral neuropathy   . Spinal stenosis of lumbar region   . Tubulovillous adenoma of colon   . Unspecified essential hypertension     Past Surgical History:  Procedure Laterality Date  . CARDIAC CATHETERIZATION  2015, 2009, 2006  . CERVICAL SPINE SURGERY  2003  . CORONARY ANGIOPLASTY WITH STENT PLACEMENT  2002, 2015  . EYE SURGERY     cataracts bilaterally  . POSTERIOR CERVICAL LAMINECTOMY N/A 10/23/2016   Procedure:  POSTERIOR CERVICAL LAMINECTOMY MULTI LEVEL CERVICAL TWO- CERVICAL THREE, CERVICAL THREE- CERVICAL FOUR;  Surgeon: Ashok Pall, MD;  Location: [facility];  Service: Neurosurgery;  Laterality: N/A;  POSTERIOR    There were no vitals filed for this visit.  Subjective Assessment - 03/29/18 1504    Subjective  No neck pain though reports continued neck stiffness. Previous right lateral hip pain improved but noting some continued intermittent soreness in proximal inner thigh region. These symptoms eased with self-muscle release technique as instructed by therapist last session. Discussed status and pt. in agreement with plans to d/c today.    Pertinent History  pulmonary , CHF, diabetes, cervical myelopathy with surgery (laminectomy c2-C3-C4) 10/2016.  History of cardiac cath, stent . hip bursitis, sleep apnea. Mother recently died    Limitations  Sitting;Lifting;Walking;House hold activities;Standing    Currently in Pain?  Yes    Pain Score  4     Pain Location  Hip    Pain Orientation  Right   right inner thigh   Pain Descriptors / Indicators  Sore    Pain Type  Chronic pain    Pain Onset  More than a month ago    Pain Frequency  Intermittent    Aggravating Factors   sitting and laying down    Pain Relieving Factors  STM    Effect of Pain on Daily Activities  limits positional tolerance for sitting, lying down         [facility] PT Assessment - 03/29/18 0001      Observation/Other Assessments   Focus on Therapeutic Outcomes (FOTO)   48% limited      AROM   Right Shoulder Flexion  120 Degrees    Right Shoulder ABduction  110 Degrees    Left Shoulder Flexion  80 Degrees    Left Shoulder ABduction  70 Degrees    Cervical Flexion  47    Cervical Extension  20    Cervical - Right Side Bend  22    Cervical - Left Side Bend  22    Cervical - Right Rotation  42    Cervical - Left Rotation  40      Strength   Right Shoulder Flexion  4+/5    Right Shoulder ABduction  4/5    Left Shoulder  Flexion  4-/5    Left Shoulder ABduction  3-/5    Right/Left Hip  Right;Left    Right Hip Flexion  4+/5    Right Hip External Rotation   5/5    Right Hip Internal Rotation  5/5    Left Hip Flexion  5/5    Left Hip External Rotation  5/5    Left Hip Internal Rotation  5/5    Right Knee Flexion  5/5    Right Knee Extension  5/5    Left Knee Flexion  5/5    Left Knee Extension  5/5                   [facility] Adult PT Treatment/Exercise - 03/29/18 0001      Knee/Hip Exercises: Stretches   ITB Stretch  Right;3 reps;30 seconds    Piriformis Stretch  Right;3 reps;30 seconds      Knee/Hip Exercises: Supine   Bridges  15 reps    Other Supine Knee/Hip Exercises  clamshell green 2x10      Manual Therapy   Soft tissue mobilization  STM R posterolateral hip in left sidelying, STM bilat. upper trapezius in sitting       Trigger Point Dry Needling - 03/29/18 1509    Muscles Treated Lower Body  Gluteus minimus;Piriformis;Tensor fascia lata    Gluteus Minimus Response  Palpable increased muscle length    Piriformis Response  Palpable increased muscle length           PT Education - 03/29/18 1511    Education Details  POC    Person(s) Educated  Patient    Methods  Explanation    Comprehension  Verbalized understanding          PT Long Term Goals - 03/29/18 1515      PT LONG TERM GOAL #1   Title  Pt will be able to walk in the community as needed (with cart) and decreased Rt LE spasms, pain for shopping.     Baseline  partially met    Time  6    Period  Weeks    Status  Partially Met      PT LONG TERM GOAL #2   Title  Pt will safely negotiate 12 or more stairs in the community with confidence and min to  no fatigue, pain in hips.     Baseline  unable    Time  6    Period  Weeks    Status  Not Met      PT LONG TERM GOAL #3   Title  Pt will be able to be I with HEP for cervical ROM, UE and LE strength    Baseline  met    Time  6    Period  Weeks    Status   Achieved      PT LONG TERM GOAL #4   Title  Pt will increase cervical AROM by 10 or more degrees in each direction, no lasting pain for improved driving, travel.     Baseline  see objective-partially met    Time  6    Period  Weeks    Status  Partially Met      PT LONG TERM GOAL #5   Title  Pt will be able to use her LUE for a small portion (25%) of her ADLs due to increased ROM and less pain.     Baseline  avoids, limits due to lack of ROM and pain     Time  6    Period  Weeks    Status  Not Met      PT LONG TERM GOAL #6   Title  Pt will transition to a [facility] based [facility]    Baseline  will continue with HEP    Time  6    Period  Weeks    Status  Not Met      PT LONG TERM GOAL #7   Title  Pt will get up from the floor unassisted as needed for safety with travel and at home.     Baseline  has difficulty    Time  6    Period  Weeks    Status  Not Met            Plan - 03/29/18 1512    Clinical Impression Statement  Pt. has improved with regard to original referring dx. for right hip pain and neck pain. She continues to have some neck stiffness s/p fusion last year but hopeful this will continue to gradually improved with stretches/time. Differential diagnosis for proximal inner thigh pain could include muscular pain vs.. hip OA-for now symptoms manageable with HEP but if symptoms continue/progress pt. is recommended to follow up with MD for further assessment of this. No further formal therapy planned at this time.    Rehab Potential  Good    PT Frequency  --   NA-d/c   PT Duration  --   NA-d/c   PT Treatment/Interventions  ADLs/Self Care Home Management;Functional mobility training;Neuromuscular re-education;Passive range of motion;Manual techniques;Moist Heat;Gait training;DME Instruction;Therapeutic activities;Therapeutic exercise;Patient/family education;Cryotherapy;Electrical Stimulation;Balance training;Stair training;Dry  needling;Cognitive remediation    PT Next Visit Plan  NA-d/c today    PT Home Exercise Plan  SLS, tandem stance , piriformis stretch (IR).  Has some exercises that she does given to her from previous episode.      Consulted and Agree with Plan of Care  Patient       Patient will benefit from skilled therapeutic intervention in order to improve the following deficits and impairments:  Abnormal gait, Decreased balance, Decreased endurance, Decreased mobility, Difficulty walking, Hypomobility, Increased muscle spasms, Impaired sensation, Obesity, Improper body mechanics, Impaired tone, Decreased range of motion, Cardiopulmonary status limiting activity, Decreased activity tolerance, Decreased safety  awareness, Decreased strength, Increased fascial restricitons, Impaired flexibility, Postural dysfunction, Pain  Visit Diagnosis: Other abnormalities of gait and mobility  Muscle weakness (generalized)  Cervicalgia     Problem List Patient Active Problem List   Diagnosis Date Noted  . OSA (obstructive sleep apnea) 10/16/2017  . Cervical stenosis of spinal canal 10/23/2016  . Neck swelling 09/28/2011  . Weight gain 01/02/2011  . Carpal tunnel syndrome 11/02/2010  . Leg swelling 08/26/2010  . ADRENAL MASS, LEFT 05/23/2010  . SPINAL STENOSIS, LUMBAR 12/27/2009  . B12 DEFICIENCY 06/10/2009  . ANEMIA 06/10/2009  . BACK PAIN, LUMBAR 11/06/2008  . KNEE PAIN, LEFT 02/03/2008  . DIABETES MELLITUS, TYPE II, UNCONTROLLED 01/12/2008  . MENOPAUSE-RELATED VASOMOTOR SYMPTOMS, HOT FLASHES 06/23/2007  . BURSITIS, LEFT HIP 06/23/2007  . HYPERLIPIDEMIA 03/25/2007  . ANXIETY 03/25/2007  . DEPRESSION 03/25/2007  . PERIPHERAL NEUROPATHY 03/25/2007  . HYPERTENSION 03/25/2007  . CORONARY ARTERY DISEASE 03/25/2007  . DIVERTICULOSIS, COLON 03/25/2007  . TUBULOVILLOUS ADENOMA, COLON 02/18/2007       PHYSICAL THERAPY DISCHARGE SUMMARY  Visits from Start of Care: 15  Current functional level related  to goals / functional outcomes: Goals partially met, original lateral hip pain and neck pain improved   Remaining deficits: Cervical stiffness, mild hip weakness, balance/gait impairment   Education / Equipment: POC Plan: Patient agrees to discharge.  Patient goals were partially met. Patient is being discharged due to being pleased with the current functional level.  ?????         Beaulah Dinning, PT, DPT 03/29/18 3:21 PM       [facility] [address] [place], [name], [address] Phone: [phone]   Fax:  [phone]  Name: Tykesha Konicki MRN: [id number] Date of Birth: 1950/11/20

## 2018-04-03 ENCOUNTER — Other Ambulatory Visit: Payer: Self-pay | Admitting: Family Medicine

## 2018-04-04 ENCOUNTER — Other Ambulatory Visit: Payer: Self-pay | Admitting: Family Medicine

## 2018-04-04 DIAGNOSIS — F32A Depression, unspecified: Secondary | ICD-10-CM

## 2018-04-04 DIAGNOSIS — F419 Anxiety disorder, unspecified: Principal | ICD-10-CM

## 2018-04-04 DIAGNOSIS — F329 Major depressive disorder, single episode, unspecified: Secondary | ICD-10-CM

## 2018-04-11 ENCOUNTER — Other Ambulatory Visit: Payer: Self-pay | Admitting: Family Medicine

## 2018-04-11 DIAGNOSIS — E119 Type 2 diabetes mellitus without complications: Secondary | ICD-10-CM

## 2018-04-11 DIAGNOSIS — Z794 Long term (current) use of insulin: Principal | ICD-10-CM

## 2018-04-12 ENCOUNTER — Other Ambulatory Visit: Payer: Self-pay

## 2018-04-12 DIAGNOSIS — E119 Type 2 diabetes mellitus without complications: Secondary | ICD-10-CM

## 2018-04-12 DIAGNOSIS — Z794 Long term (current) use of insulin: Principal | ICD-10-CM

## 2018-04-20 ENCOUNTER — Telehealth: Payer: Self-pay | Admitting: Family Medicine

## 2018-04-20 NOTE — Telephone Encounter (Signed)
Copied from [place] [id number]. Topic: Quick Communication - See Telephone Encounter >> Apr 20, 2018 10:52 AM Conception Chancy, NT wrote: CRM for notification. See Telephone encounter for: 04/20/18.  Patient is calling and states she needs a refill on Insulin Glargine (BASAGLAR KWIKPEN) 100 UNIT/ML SOPN  and she needs it to say 38 units at night and not 32 as she is using 38. Patient states she is completely out.   [id number] - [place], [place] [address] [place] [address] Phone: [phone] Fax: [phone]

## 2018-04-21 ENCOUNTER — Other Ambulatory Visit: Payer: Self-pay

## 2018-04-21 DIAGNOSIS — Z794 Long term (current) use of insulin: Principal | ICD-10-CM

## 2018-04-21 DIAGNOSIS — E1165 Type 2 diabetes mellitus with hyperglycemia: Secondary | ICD-10-CM

## 2018-04-21 MED ORDER — BASAGLAR KWIKPEN 100 UNIT/ML ~~LOC~~ SOPN: PEN_INJECTOR | SUBCUTANEOUS | 1 refills | Status: DC

## 2018-04-21 NOTE — Telephone Encounter (Signed)
Rx sent to pt pharmacy as requested 

## 2018-04-22 ENCOUNTER — Other Ambulatory Visit: Payer: Self-pay | Admitting: Family Medicine

## 2018-06-06 ENCOUNTER — Other Ambulatory Visit: Payer: Self-pay | Admitting: Family Medicine

## 2018-06-06 ENCOUNTER — Telehealth: Payer: Self-pay | Admitting: *Deleted

## 2018-06-06 ENCOUNTER — Other Ambulatory Visit: Payer: Self-pay

## 2018-06-06 MED ORDER — ROSUVASTATIN CALCIUM 20 MG PO TABS: 20.0000 mg | ORAL_TABLET | Freq: Every day | ORAL | 0 refills | Status: DC

## 2018-06-06 NOTE — Telephone Encounter (Signed)
Prior auth for [employer] 100 unit/ml sent to [email or web address] [id number].

## 2018-06-07 ENCOUNTER — Ambulatory Visit: Payer: Medicare Other | Admitting: Family Medicine

## 2018-06-07 ENCOUNTER — Encounter: Payer: Self-pay | Admitting: Family Medicine

## 2018-06-07 VITALS — BP 118/70 | HR 98 | Temp 98.6°F

## 2018-06-07 DIAGNOSIS — IMO0002 Reserved for concepts with insufficient information to code with codable children: Secondary | ICD-10-CM

## 2018-06-07 DIAGNOSIS — F419 Anxiety disorder, unspecified: Secondary | ICD-10-CM

## 2018-06-07 DIAGNOSIS — E1165 Type 2 diabetes mellitus with hyperglycemia: Secondary | ICD-10-CM

## 2018-06-07 DIAGNOSIS — I10 Essential (primary) hypertension: Secondary | ICD-10-CM | POA: Diagnosis not present

## 2018-06-07 DIAGNOSIS — Z794 Long term (current) use of insulin: Secondary | ICD-10-CM | POA: Diagnosis not present

## 2018-06-07 LAB — POCT GLYCOSYLATED HEMOGLOBIN (HGB A1C): Hemoglobin A1C: 10.6 % — AB (ref 4.0–5.6)

## 2018-06-07 MED ORDER — LORAZEPAM 0.5 MG PO TABS: ORAL_TABLET | ORAL | 0 refills | Status: DC

## 2018-06-07 NOTE — Progress Notes (Signed)
Subjective:    Patient ID: [name] Bean, female    DOB: 31-Dec-1950, 68 y.o.   MRN: [id number]  No chief complaint on file.   HPI Patient was seen today for f/u.   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  Requesting something for anxiety as she had bad experiences at the dentist ever since childhood.  Pt states she will need to have all of her upper teeth removed at some point, then get dentures.  DM: -Taking Basaglar 38 units and NovoLog 13 units 3 times daily with meals -Patient states basaglar has become expensive.  States was over $100 at the pharmacy -pt does not check her bs at home. -tries to eat better, but does not always succeed. -states is giving up all sugar for Malena Edman (starts tomorrow x 40 days)  HTN: -taking hydralazine 75 mg BID, coreg 25 BID, losartan 100 mg, spironolactone 25 mg, torsemide 20-40 mg -also followed by Cards.  Past Medical History:  Diagnosis Date  . Anemia   . Anxiety   . Arthritis   . Asthma   . Broken arm    left arm  . Bronchitis   . Bursitis of left hip   . CAD (coronary artery disease)   . Candidiasis, vagina   . Cardiac arrest ([place]) 06/20/2016   at [facility]; ? due to flash pulm edema vs undertreated chronic CHF  . Cerumen impaction   . CHF (congestive heart failure) ([place])    RECENT ADMIT TO [facility]   . Colon, diverticulosis   . Depression   . Diabetes mellitus    15 YRS AGO  . Fatigue   . Gastroenteritis   . Hot flashes, menopausal   . Hyperlipidemia   . Knee pain, left   . Lumbar back pain   . Maxillary sinusitis    history  . Muscle tear    right gluteus  . Nausea   . OSA (obstructive sleep apnea) 10/16/2017  . Other B-complex deficiencies   . Otitis media, acute    left  . Peripheral neuropathy   . Spinal stenosis of lumbar region   . Tubulovillous adenoma of colon   . Unspecified essential hypertension     Allergies  Allergen Reactions  . Sitagliptin Other (See Comments)    Had pancreatitis while on Januvia      ROS General: Denies fever, chills, night sweats, changes in weight, changes in appetite HEENT: Denies headaches, ear pain, changes in vision, rhinorrhea, sore throat CV: Denies CP, palpitations, SOB, orthopnea Pulm: Denies SOB, cough, wheezing GI: Denies abdominal pain, nausea, vomiting, diarrhea, constipation GU: Denies dysuria, hematuria, frequency, vaginal discharge Msk: Denies muscle cramps, joint pains Neuro: Denies weakness, numbness, tingling Skin: Denies rashes, bruising Psych: Denies depression, hallucinations   +anxiety    Objective:    Blood pressure 118/70, pulse 98, temperature 98.6 F (37 C), temperature source Oral, SpO2 98 %.  Gen. Pleasant, well-nourished, in no distress, normal affect   HEENT: [place]/AT, face symmetric, no scleral icterus, PERRLA, nares patent without drainage Lungs: no accessory muscle use, CTAB, no wheezes or rales Cardiovascular: RRR, no m/r/g, no peripheral edema Neuro:  A&Ox3, CN II-XII intact, normal gait Skin:  Warm, no lesions/ rash  Wt Readings from Last 3 Encounters:  12/31/17 198 lb (89.8 kg)  11/01/17 195 lb (88.5 kg)  10/13/17 191 lb (86.6 kg)    Lab Results  Component Value Date   WBC 6.2 10/19/2016   HGB 11.8 (L) 10/19/2016   HCT 35.8 (L) 10/19/2016  PLT 218 10/19/2016   GLUCOSE 257 (H) 06/24/2017   CHOL 190 01/02/2011   TRIG 167.0 (H) 01/02/2011   HDL 52.30 01/02/2011   LDLCALC 104 (H) 01/02/2011   ALT 11 (L) 09/09/2016   AST 19 09/09/2016   NA 137 06/24/2017   K 3.6 06/24/2017   CL 101 06/24/2017   CREATININE 0.69 06/24/2017   BUN 7 06/24/2017   CO2 24 06/24/2017   TSH 0.981 10/28/2010   INR 1.0 RATIO 10/27/2007   HGBA1C 8.0 (A) 09/28/2017   MICROALBUR 0.67 01/02/2011    Assessment/Plan:  Essential hypertension -controlled -continue coreg 25 mg BID, hydralazine 50 mg BID, losartan 100 mg, spironolactone 25 mg, torsemide 20 mg -continue f/u with Cardiology  Uncontrolled insulin-treated type 2 diabetes  mellitus ([place])  -Plan: POCT glycosylated hemoglobin (Hb A1C)  10.6% -pt encouraged to start checking fsbs  -will increase Novolog to 15 units TID with meals and basaglar 40 units.  Anxiety  -related to dental procedures -will give limited supply for upcoming dental procedures - Plan: LORazepam (ATIVAN) 0.5 MG tablet   F/u in 1 month  Grier Mitts, MD

## 2018-06-07 NOTE — Patient Instructions (Addendum)
It is important that you start checking your blood sugar at home.  Your hemoglobin A1c today was 10.6%.  We will increase her NovoLog to 15 units 3 times daily with meals and your basaglar to 40 units.  You should follow-up in the next month. Preventing Diabetes Mellitus Complications You can take action to prevent or slow down problems that are caused by diabetes (diabetes mellitus). Following your diabetes plan and taking care of yourself can reduce your risk of serious or life-threatening complications. What actions can I take to prevent diabetes complications? Manage your diabetes   Follow instructions from your health care providers about managing your diabetes. Your diabetes may be managed by a team of health care providers who can teach you how to care for yourself and can answer questions that you have.  Educate yourself about your condition so you can make healthy choices about eating and physical activity.  Check your blood sugar (glucose) levels as often as directed. Your health care provider will help you decide how often to check your blood glucose level depending on your treatment goals and how well you are meeting them.  Ask your health care provider if you should take low-dose aspirin daily and what dose is recommended for you. Taking low-dose aspirin daily is recommended to help prevent cardiovascular disease. Do not use nicotine or tobacco Do not use any products that contain nicotine or tobacco, such as cigarettes and e-cigarettes. If you need help quitting, ask your health care provider. Nicotine raises your risk for diabetes problems. If you quit using nicotine:  You will lower your risk for heart attack, stroke, nerve disease, and kidney disease.  Your cholesterol and blood pressure may improve.  Your blood circulation will improve. Keep your blood pressure under control Your personal target blood pressure is determined based on:  Your age.  Your medicines.  How  long you have had diabetes.  Any other medical conditions you have. To control your blood pressure:  Follow instructions from your health care provider about meal planning, exercise, and medicines.  Make sure your health care provider checks your blood pressure at every medical visit.  Monitor your blood pressure at home as told by your health care provider.  Keep your cholesterol under control To control your cholesterol:  Follow instructions from your health care provider about meal planning, exercise, and medicines.  Have your cholesterol checked at least once a year.  You may be prescribed medicine to lower cholesterol (statin). If you are not taking a statin, ask your health care provider if you should be. Controlling your cholesterol may:  Help prevent heart disease and stroke. These are the most common health problems for people with diabetes.  Improve your blood flow. Schedule and keep yearly physical exams and eye exams Your health care provider will tell you how often you need medical visits depending on your diabetes management plan. Keep all follow-up visits as directed. This is important so possible problems can be identified early and complications can be avoided or treated.  Every visit with your health care provider should include measuring your: ? Weight. ? Blood pressure. ? Blood glucose control.  Your A1c (hemoglobin A1c) level should be checked: ? At least 2 times a year, if you are meeting your treatment goals. ? 4 times a year, if you are not meeting treatment goals or if your treatment goals have changed.  Your blood lipids (lipid profile) should be checked yearly. You should also be checked yearly for  protein in your urine (urine microalbumin).  If you have type 1 diabetes, get an eye exam 3-5 years after you are diagnosed, and then once a year after your first exam.  If you have type 2 diabetes, get an eye exam as soon as you are diagnosed, and then  once a year after your first exam. Keep your vaccines current It is recommended that you receive:  A flu (influenza) vaccine every year.  A pneumonia (pneumococcal) vaccine and a hepatitis B vaccine. If you are age 34 or older, you may get the pneumonia vaccine as a series of two separate shots. Ask your health care provider which other vaccines may be recommended. Take care of your feet Diabetes may cause you to have poor blood circulation to your legs and feet. Because of this, taking care of your feet is very important. Diabetes can cause:  The skin on the feet to get thinner, break more easily, and heal more slowly.  Nerve damage in your legs and feet, which results in decreased feeling. You may not notice minor injuries that could lead to serious problems. To avoid foot problems:  Check your skin and feet every day for cuts, bruises, redness, blisters, or sores.  Schedule a foot exam with your health care provider once every year. This exam includes: ? Inspecting of the structure and skin of your feet. ? Checking the pulses and sensation in your feet.  Make sure that your health care provider performs a visual foot exam at every medical visit.  Take care of your teeth People with poorly controlled diabetes are more likely to have gum (periodontal) disease. Diabetes can make periodontal diseases harder to control. If not treated, periodontal diseases can lead to tooth loss. To prevent this:  Brush your teeth twice a day.  Floss at least once a day.  Visit your dentist 2 times a year. Drink responsibly Limit alcohol intake to no more than 1 drink a day for nonpregnant women and 2 drinks a day for men. One drink equals 12 oz of beer, 5 oz of wine, or 1 oz of hard liquor.  It is important to eat food when you drink alcohol to avoid low blood glucose (hypoglycemia). Avoid alcohol if you:  Have a history of alcohol abuse or dependence.  Are pregnant.  Have liver disease,  pancreatitis, advanced neuropathy, or severe hypertriglyceridemia. Lessen stress Living with diabetes can be stressful. When you are experiencing stress, your blood glucose may be affected in two ways:  Stress hormones may cause your blood glucose to rise.  You may be distracted from taking good care of yourself. Be aware of your stress level and make changes to help you manage challenging situations. To lower your stress levels:  Consider joining a support group.  Do planned relaxation or meditation.  Do a hobby that you enjoy.  Maintain healthy relationships.  Exercise regularly.  Work with your health care provider or a mental health professional. Summary  You can take action to prevent or slow down problems that are caused by diabetes (diabetes mellitus). Following your diabetes plan and taking care of yourself can reduce your risk of serious or life-threatening complications.  Follow instructions from your health care providers about managing your diabetes. Your diabetes may be managed by a team of health care providers who can teach you how to care for yourself and can answer questions that you have.  Your health care provider will tell you how often you need medical visits  depending on your diabetes management plan. Keep all follow-up visits as directed. This is important so possible problems can be identified early and complications can be avoided or treated. This information is not intended to replace advice given to you by your health care provider. Make sure you discuss any questions you have with your health care provider. Document Released: 12/16/2010 Document Revised: 11/17/2016 Document Reviewed: 12/28/2015 Elsevier Interactive Patient Education  2019 [employer].

## 2018-06-08 NOTE — Telephone Encounter (Signed)
Fax received from [facility] stating the request was denied as there was not sufficient info as to why the pt cannot use Levemir pen, Tresiba, Lantus pen or Toujeo.  Fax given to Dr Volanda Napoleon' asst.

## 2018-06-08 NOTE — Telephone Encounter (Signed)
FYI

## 2018-06-09 NOTE — Telephone Encounter (Signed)
Called pt left a message to call the office regarding her Insulin Basaglar [place]iborne Rigg

## 2018-06-10 ENCOUNTER — Encounter: Payer: Self-pay | Admitting: Family Medicine

## 2018-07-15 ENCOUNTER — Other Ambulatory Visit: Payer: Self-pay

## 2018-07-15 ENCOUNTER — Ambulatory Visit (INDEPENDENT_AMBULATORY_CARE_PROVIDER_SITE_OTHER): Payer: Medicare Other | Admitting: Family Medicine

## 2018-07-15 ENCOUNTER — Encounter: Payer: Self-pay | Admitting: Family Medicine

## 2018-07-15 ENCOUNTER — Other Ambulatory Visit: Payer: Self-pay | Admitting: Family Medicine

## 2018-07-15 DIAGNOSIS — E1165 Type 2 diabetes mellitus with hyperglycemia: Secondary | ICD-10-CM

## 2018-07-15 DIAGNOSIS — Z794 Long term (current) use of insulin: Principal | ICD-10-CM

## 2018-07-15 NOTE — Progress Notes (Signed)
Error with Web ex scheduling?  Pt on schedule for 2pm today, however web ex email states appointment is next week at 9am.  Attempted to call pt, but no answer.

## 2018-07-18 ENCOUNTER — Ambulatory Visit (INDEPENDENT_AMBULATORY_CARE_PROVIDER_SITE_OTHER): Payer: Medicare Other | Admitting: Family Medicine

## 2018-07-18 ENCOUNTER — Other Ambulatory Visit: Payer: Self-pay

## 2018-07-18 ENCOUNTER — Encounter: Payer: Self-pay | Admitting: Family Medicine

## 2018-07-18 DIAGNOSIS — E1142 Type 2 diabetes mellitus with diabetic polyneuropathy: Secondary | ICD-10-CM

## 2018-07-18 DIAGNOSIS — I509 Heart failure, unspecified: Secondary | ICD-10-CM | POA: Diagnosis not present

## 2018-07-18 MED ORDER — TORSEMIDE 20 MG PO TABS: 20.0000 mg | ORAL_TABLET | ORAL | 2 refills | Status: DC

## 2018-07-18 NOTE — Progress Notes (Signed)
Virtual Visit via Video Note Pt's video was not working.   Continued visit via webex audio only.    I connected with Laureen Abrahams on 07/18/18 at  9:00 AM EDT by a video enabled telemedicine application and verified that I am speaking with the correct person using two identifiers.  Location patient: home Location provider:home office Persons participating in the virtual visit: patient, provider  I discussed the limitations of evaluation and management by telemedicine and the availability of in person appointments. The patient expressed understanding and agreed to proceed.   HPI: Pt states she has been eating better over the last 4 days as she knew she had this appointment.   Admits eating a bag of oreos.  Pt having oatmeal this am.  Taking basaglar 38 units and novolog 12 units TID.  Trying to avoid going out.  States is considering using her walker to go to the mail box for exercise.  Pt states she just found her glucometer.  Pt knows her blood sugar is improving as she is urinating less frequently.  Pt does not need diabetic supplies.    Needs refill on torsemide.  Pt had an upcoming dental appointment for tooth extraction, but cancelled the appointment as it was not urgent.  Denies increased thirst, hunger, HAs.   ROS: See pertinent positives and negatives per HPI.  Past Medical History:  Diagnosis Date  . Anemia   . Anxiety   . Arthritis   . Asthma   . Broken arm    left arm  . Bronchitis   . Bursitis of left hip   . CAD (coronary artery disease)   . Candidiasis, vagina   . Cardiac arrest ([place]) 06/20/2016   at [facility]; ? due to flash pulm edema vs undertreated chronic CHF  . Cerumen impaction   . CHF (congestive heart failure) ([place])    RECENT ADMIT TO [facility]   . Colon, diverticulosis   . Depression   . Diabetes mellitus    15 YRS AGO  . Fatigue   . Gastroenteritis   . Hot flashes, menopausal   . Hyperlipidemia   . Knee pain, left   . Lumbar back pain   . Maxillary  sinusitis    history  . Muscle tear    right gluteus  . Nausea   . OSA (obstructive sleep apnea) 10/16/2017  . Other B-complex deficiencies   . Otitis media, acute    left  . Peripheral neuropathy   . Spinal stenosis of lumbar region   . Tubulovillous adenoma of colon   . Unspecified essential hypertension     Past Surgical History:  Procedure Laterality Date  . CARDIAC CATHETERIZATION  2015, 2009, 2006  . CERVICAL SPINE SURGERY  2003  . CORONARY ANGIOPLASTY WITH STENT PLACEMENT  2002, 2015  . EYE SURGERY     cataracts bilaterally  . POSTERIOR CERVICAL LAMINECTOMY N/A 10/23/2016   Procedure: POSTERIOR CERVICAL LAMINECTOMY MULTI LEVEL CERVICAL TWO- CERVICAL THREE, CERVICAL THREE- CERVICAL FOUR;  Surgeon: Ashok Pall, MD;  Location: [place];  Service: Neurosurgery;  Laterality: N/A;  POSTERIOR    Family History  Problem Relation Age of Onset  . Pneumonia Father        deceased  . Alcohol abuse Father   . Hypertension Mother   . Diabetes Mother      Current Outpatient Medications:  .  aspirin EC 81 MG tablet, Take 81 mg by mouth daily. , Disp: , Rfl:  .  BD PEN NEEDLE  NANO U/F 32G X 4 MM MISC, USE TO INJECT INSULIN 5 TIMES A DAY, Disp: 100 each, Rfl: 5 .  bimatoprost (LUMIGAN) 0.01 % SOLN, Lumigan 0.01 % eye drops  PUT 1 DROP INTO BOTH EYES AT BEDTIME, Disp: , Rfl:  .  Blood Glucose Monitoring Suppl (ACCU-CHEK COMPACT CARE KIT) KIT, Accu-Chek Compact Plus Care kit  USE AS INSTRUCTED., Disp: , Rfl:  .  carvedilol (COREG) 25 MG tablet, Take 1 tablet (25 mg total) by mouth 2 (two) times daily with a meal., Disp: 180 tablet, Rfl: 6 .  glucose blood (FREESTYLE TEST STRIPS) test strip, 1 each by Other route daily. (accu-chek guide) DX E11.9, Disp: 200 each, Rfl: 12 .  hydrALAZINE (APRESOLINE) 50 MG tablet, TAKE 1 AND 1/2 TABLETS BY MOUTH EVERY 12 HOURS, Disp: 270 tablet, Rfl: 1 .  insulin aspart (NOVOLOG) 100 UNIT/ML FlexPen, Inject 12 units 3 times daily with meals, Disp: 33 mL, Rfl:  2 .  Insulin Glargine (BASAGLAR KWIKPEN) 100 UNIT/ML SOPN, INJECT 38 UNITS SUBCUTANEOUSLY NIGHTLY, Disp: 15 mL, Rfl: 1 .  latanoprost (XALATAN) 0.005 % ophthalmic solution, Place 1 drop into both eyes at bedtime., Disp: , Rfl:  .  LORazepam (ATIVAN) 0.5 MG tablet, Take one tab 30 mins prior to dental procedure., Disp: 5 tablet, Rfl: 0 .  losartan (COZAAR) 100 MG tablet, TAKE 1 TABLET BY MOUTH EVERY DAY, Disp: 90 tablet, Rfl: 0 .  rosuvastatin (CRESTOR) 20 MG tablet, TAKE 1 TABLET BY MOUTH EVERY DAY, Disp: 90 tablet, Rfl: 0 .  rosuvastatin (CRESTOR) 20 MG tablet, Take 1 tablet (20 mg total) by mouth daily., Disp: 90 tablet, Rfl: 0 .  sertraline (ZOLOFT) 50 MG tablet, TAKE 1 TABLET BY MOUTH EVERY DAY, Disp: 90 tablet, Rfl: 1 .  spironolactone (ALDACTONE) 25 MG tablet, Take 1 tablet (25 mg total) by mouth daily., Disp: 90 tablet, Rfl: 3 .  tiZANidine (ZANAFLEX) 2 MG tablet, Take by mouth every 6 (six) hours as needed for muscle spasms., Disp: , Rfl:  .  torsemide (DEMADEX) 20 MG tablet, Take 20-40 mg by mouth See admin instructions. Take 20 mg by mouth daily and may take 20-40 mg by mouth as needed for swelling, Disp: , Rfl:  .  varenicline (CHANTIX CONTINUING MONTH PAK) 1 MG tablet, Take 1 tablet (1 mg total) by mouth 2 (two) times daily., Disp: 60 tablet, Rfl: 6  EXAM:  VITALS per patient if applicable:  RR between 12-20 bpm  GENERAL: sounds pleasant, alert, oriented, in no acute distress  RESP: Good air movement.  No gasping, coughing, SOB, or wheezing.    PSYCH/NEURO: pleasant and cooperative, no obvious depression or anxiety, speech and thought processing grossly intact  ASSESSMENT AND PLAN:  Discussed the following assessment and plan:  Type 2 diabetes mellitus with peripheral neuropathy (HCC) -uncontrolled -hgb A1C 10.6 % on 06/07/18 -pt encouraged to start checking fsbs at home and keeping a log to the readings -continue basaglar 38 units and novolog 12 units TID -lifestyle  modifications strongly encouraged. -will f/u in 1-2 months, sooner if needed.  Chronic congestive heart failure, unspecified heart failure type ([place])  -continue other meds including Coreg 25 mg BID, hydralazine 75 mg BID, spironolactone 25 mg daily. -continue f/u with Cardiology - Plan: torsemide (DEMADEX) 20 MG tablet   I discussed the assessment and treatment plan with the patient. The patient was provided an opportunity to ask questions and all were answered. The patient agreed with the plan and demonstrated an understanding of  the instructions.   The patient was advised to call back or seek an in-person evaluation if the symptoms worsen or if the condition fails to improve as anticipated.  11 mins of non face to face time spent with patient on the call.  Billie Ruddy, MD

## 2018-07-28 ENCOUNTER — Other Ambulatory Visit: Payer: Self-pay | Admitting: Family Medicine

## 2018-08-19 ENCOUNTER — Telehealth: Payer: Self-pay | Admitting: Family Medicine

## 2018-08-19 NOTE — Telephone Encounter (Signed)
Medication Samples have been provided to the patient.  Drug name:Basaglar Kwik Pen    Strength: 100 units        Qty: 3 pens  LOT: [id number]  Exp.Date: 04/2019  Dosing instructions: INJECT 38 UNITS SUBCUTANEOUSLY EVERY NIGHT.          The patient has been instructed regarding the correct time, dose, and frequency of taking this medication, including desired effects and most common side effects.   [name] Bean 10:10 AM 08/19/2018

## 2018-08-19 NOTE — Telephone Encounter (Signed)
Copied from [place] [phone]. Topic: General - Other >> Aug 19, 2018  9:13 AM Lennox Solders wrote: Reason for CRM: pt is calling and needs more samples of basaglar kwikpen if no samples avail [facility]. Pt is out of med

## 2018-08-19 NOTE — Telephone Encounter (Signed)
Pt will be picking up Rx in the office

## 2018-08-26 ENCOUNTER — Telehealth: Payer: Self-pay | Admitting: Family Medicine

## 2018-08-26 ENCOUNTER — Other Ambulatory Visit: Payer: Self-pay | Admitting: Family Medicine

## 2018-08-26 MED ORDER — INSULIN LISPRO (1 UNIT DIAL) 100 UNIT/ML (KWIKPEN): PEN_INJECTOR | SUBCUTANEOUS | 5 refills | Status: DC

## 2018-08-26 NOTE — Telephone Encounter (Unsigned)
Copied from [place] [phone]. Topic: General - Other >> Aug 26, 2018  8:53 AM Celene Kras A wrote: Reason for CRM: Pt called stating she went to pick up her insulin yesterday to find out its no longer covered by her insurance. Pt states she would like an alternative insulin sent in that is covered by her insurance. Pt states she needs insulin and is requesting a call back today. Please advise.   [id number] [place] [place] [address] AT [address] [name] [address] Phone: [phone] Fax: [phone] Not a 24 hour pharmacy; exact hours not known.

## 2018-08-26 NOTE — Telephone Encounter (Signed)
Pt insurance will not cover her Novolog but will Humalog Kwik pen, pt requests for Novolog Kwik pen Rx, please advise

## 2018-08-26 NOTE — Telephone Encounter (Signed)
Ok to send in [employer] for pt.

## 2018-08-26 NOTE — Telephone Encounter (Signed)
Rx for Humalog sent to pt pharmacy

## 2018-08-26 NOTE — Telephone Encounter (Signed)
Upon re-reading previous note ok to send in the pen her insurance will pay for.  Unsure if it is the novolog or humalog pen upon the note as both are listed.  Please verify with pt.

## 2018-08-29 NOTE — Telephone Encounter (Signed)
The basaglar [name] Bean is not covered by the insurance.  Was this replaced by the humolog kwikpen?  Thanks

## 2018-08-31 NOTE — Telephone Encounter (Signed)
Spoke with pt states that she received her Humalog Insulin

## 2018-09-01 ENCOUNTER — Other Ambulatory Visit (HOSPITAL_COMMUNITY): Payer: Self-pay | Admitting: Internal Medicine

## 2018-09-05 ENCOUNTER — Other Ambulatory Visit: Payer: Self-pay | Admitting: Family Medicine

## 2018-09-08 ENCOUNTER — Telehealth: Payer: Self-pay

## 2018-09-08 ENCOUNTER — Telehealth: Payer: Self-pay | Admitting: Family Medicine

## 2018-09-08 NOTE — Telephone Encounter (Signed)
Ok to send refill  

## 2018-09-08 NOTE — Telephone Encounter (Unsigned)
Copied from [place] [phone]. Topic: Quick Communication - Rx Refill/Question >> Sep 08, 2018 10:59 AM Alanda Slim E wrote: Medication: The Pt was prescribed Insulin Glargine (BASAGLAR KWIKPEN) 100 UNIT/ML SOPN but her insurance [employer] suggested that she take and be prescribed the Lantus kwikpen due to cost/ Pt needs a refill or new Rx sent to pharmacy/ please advise  Has the patient contacted their pharmacy? Yes - call pcp  Preferred Pharmacy (with phone number or street name[id number] [place], [place]. AT [address] [place] [phone] (Phone) [phone] (Fax)    Agent: Please be advised that RX refills may take up to 3 business days. We ask that you follow-up with your pharmacy.

## 2018-09-08 NOTE — Telephone Encounter (Signed)
PA forInsulin Glargine (BASAGLAR KWIKPEN) 100 UNIT/ML SOPN has been sent to cover my meds.   Key: [id number] - PA Case ID: [id number] - Rx #: [id number]

## 2018-09-08 NOTE — Telephone Encounter (Signed)
Please advise if ok to send refill

## 2018-09-09 ENCOUNTER — Other Ambulatory Visit: Payer: Self-pay

## 2018-09-09 ENCOUNTER — Other Ambulatory Visit: Payer: Self-pay | Admitting: Family Medicine

## 2018-09-09 ENCOUNTER — Encounter: Payer: Self-pay | Admitting: Physical Therapy

## 2018-09-09 ENCOUNTER — Ambulatory Visit: Payer: Medicare Other | Attending: Neurosurgery | Admitting: Physical Therapy

## 2018-09-09 DIAGNOSIS — R2689 Other abnormalities of gait and mobility: Secondary | ICD-10-CM | POA: Diagnosis not present

## 2018-09-09 DIAGNOSIS — M6281 Muscle weakness (generalized): Secondary | ICD-10-CM | POA: Insufficient documentation

## 2018-09-09 DIAGNOSIS — M542 Cervicalgia: Secondary | ICD-10-CM

## 2018-09-09 DIAGNOSIS — M25551 Pain in right hip: Secondary | ICD-10-CM | POA: Diagnosis present

## 2018-09-09 MED ORDER — INSULIN GLARGINE 100 UNIT/ML SOLOSTAR PEN: 38.0000 [IU] | PEN_INJECTOR | Freq: Every day | SUBCUTANEOUS | 9 refills | Status: DC

## 2018-09-09 NOTE — Therapy (Signed)
[place], [name], [address] Phone: [phone]   Fax:  [phone]  Physical Therapy Evaluation  Patient Details  Name: [name] Bean MRN: [id number] Date of Birth: 1950-07-24 Referring Provider (PT): Dr. Christella Noa    Encounter Date: 09/09/2018  PT End of Session - 09/09/18 1509    Visit Number  1    Number of Visits  12    Date for PT Re-Evaluation  10/21/18    PT Start Time  0836    PT Stop Time  0919    PT Time Calculation (min)  43 min    Activity Tolerance  Patient tolerated treatment well    Behavior During Therapy  [facility] for tasks assessed/performed       Past Medical History:  Diagnosis Date  . Anemia   . Anxiety   . Arthritis   . Asthma   . Broken arm    left arm  . Bronchitis   . Bursitis of left hip   . CAD (coronary artery disease)   . Candidiasis, vagina   . Cardiac arrest ([place]) 06/20/2016   at [facility]; ? due to flash pulm edema vs undertreated chronic CHF  . Cerumen impaction   . CHF (congestive heart failure) ([place])    RECENT ADMIT TO [facility]   . Colon, diverticulosis   . Depression   . Diabetes mellitus    15 YRS AGO  . Fatigue   . Gastroenteritis   . Hot flashes, menopausal   . Hyperlipidemia   . Knee pain, left   . Lumbar back pain   . Maxillary sinusitis    history  . Muscle tear    right gluteus  . Nausea   . OSA (obstructive sleep apnea) 10/16/2017  . Other B-complex deficiencies   . Otitis media, acute    left  . Peripheral neuropathy   . Spinal stenosis of lumbar region   . Tubulovillous adenoma of colon   . Unspecified essential hypertension     Past Surgical History:  Procedure Laterality Date  . CARDIAC CATHETERIZATION  2015, 2009, 2006  . CERVICAL SPINE SURGERY  2003  . CORONARY ANGIOPLASTY WITH STENT PLACEMENT  2002, 2015  . EYE SURGERY     cataracts bilaterally  . POSTERIOR CERVICAL LAMINECTOMY N/A 10/23/2016   Procedure: POSTERIOR CERVICAL  LAMINECTOMY MULTI LEVEL CERVICAL TWO- CERVICAL THREE, CERVICAL THREE- CERVICAL FOUR;  Surgeon: Ashok Pall, MD;  Location: [place];  Service: Neurosurgery;  Laterality: N/A;  POSTERIOR    There were no vitals filed for this visit.   Subjective Assessment - 09/09/18 0844    Subjective  Pt returns to PT with new referral for exacerbation of past problem.  Shedid not see the MD.  Her hip, neck pain is getting worse since March.  Some days are better than others.  She has fallen a few times  Uses cane only in the community.  Obviously she has not been out beyond [employer] due to COVID-19.       Pertinent History  CAD, cervical fusion and calcification of ligament, ataxia, CHF, HTN, DM    Limitations  Lifting;Standing;Walking;House hold activities    Diagnostic tests  none recently    Patient Stated Goals  less pain, walk better     Currently in Pain?  Yes    Pain Score  4     Pain Location  Hip    Pain Orientation  Right    Pain Descriptors /  Indicators  Burning;Aching   bruising   Pain Type  Chronic pain    Pain Radiating Towards  buttock     Pain Onset  More than a month ago    Pain Frequency  Intermittent    Aggravating Factors   cold weather, activity does not matter     Pain Relieving Factors  warm weather, PT     Effect of Pain on Daily Activities  limits comfort at home     Multiple Pain Sites  Yes    Pain Score  2    Pain Location  Neck    Pain Orientation  Right    Pain Descriptors / Indicators  Aching    Pain Type  Chronic pain    Pain Radiating Towards  Rt shoulder     Pain Onset  More than a month ago    Pain Frequency  Intermittent    Aggravating Factors   PM hours,lifting arm     Pain Relieving Factors  rest , heat     Effect of Pain on Daily Activities  unable to tunr head to interact with environment          [facility] PT Assessment - 09/09/18 0001      Assessment   Medical Diagnosis  cervical spondylosis, myelopathy    Referring Provider (PT)  Dr. Christella Noa      Onset Date/Surgical Date  --   chronic 2018   Hand Dominance  Right    Prior Therapy  Yes      Precautions   Precautions  Fall      Restrictions   Weight Bearing Restrictions  No      Balance Screen   Has the patient fallen in the past 6 months  Yes    How many times?  5   has to call someone to help her up , loses control of RtLE   Has the patient had a decrease in activity level because of a fear of falling?   Yes    Is the patient reluctant to leave their home because of a fear of falling?   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, hospital     Leisure  friends, travel to the [employer]    Overall Cognitive Status  Within Functional Limits for tasks assessed      Observation/Other Assessments   Focus on Therapeutic Outcomes (FOTO)   NT due to time constraints       Observation/Other Assessments-Edema    Edema  --   has baseline level of LE edema      Sensation   Additional Comments  Rt foot numb plantar aspect       Posture/Postural Control   Posture/Postural Control  Postural limitations    Postural Limitations  Rounded Shoulders;Forward head;Posterior pelvic tilt    Posture Comments  elevated shoulders, hips in ER        AROM   Cervical Flexion  30    Cervical Extension  15    Cervical - Right Side Bend  20    Cervical - Left Side Bend  20    Cervical - Right Rotation  25    Cervical - Left Rotation  25      PROM   Overall PROM Comments  decreased hip ROM especially IR bilaterally , cervical PROM limited in all planes by stiffness      Strength   Right  Shoulder Flexion  4/5    Right Shoulder ABduction  4/5    Left Shoulder Flexion  3+/5    Left Shoulder ABduction  3+/5    Right Hip Flexion  4/5    Left Hip Flexion  4/5    Right Knee Flexion  4+/5    Right Knee Extension  4+/5    Left Knee Flexion  4+/5    Left Knee Extension  4+/5      Flexibility   Hamstrings  50    Piriformis  tight, spasm R       Palpation   Palpation comment  tender central and Rt posterior cervicals into Rt upper trap, Pain at Greater trochanter and glutes       Transfers   Transfers  Sit to Stand;Supine to Sit;Sit to Supine    Supine to Sit  4: Min guard    Sit to Supine  6: Modified independent (Device/Increase time)    Comments  pt verbalized she can get up on her own if she falls in her bedroom       Ambulation/Gait   Ambulation Distance (Feet)  150 Feet    Assistive device  Small based quad cane    Gait Pattern  Step-to pattern;Decreased step length - right;Decreased step length - left;Decreased dorsiflexion - right;Decreased dorsiflexion - left;Ataxic;Poor foot clearance - right                Objective measurements completed on examination: See above findings.      [place] Adult PT Treatment/Exercise - 09/09/18 0001      Self-Care   Self-Care  Other Self-Care Comments    Other Self-Care Comments   POC, safety, previous HEP       Manual Therapy   Manual Therapy  Myofascial release    Myofascial Release  Rt hip in sidelying              PT Education - 09/09/18 1507    Education Details  POC, previous HEP     Person(s) Educated  Patient    Methods  Explanation;Verbal cues    Comprehension  Verbalized understanding          PT Long Term Goals - 09/09/18 1509      PT LONG TERM GOAL #1   Title  Pt will be able to walk in the community as needed (with cart) and decreased Rt LE pain for shopping.     Time  6    Period  Weeks    Status  New    [place] Date  10/21/18      PT LONG TERM GOAL #2   Title  Pt will be able  to report no falls for >2 consecutive weeks and improved confidence     Time  6    Period  Weeks    Status  New    [place] Date  10/21/18      PT LONG TERM GOAL #3   Title  Pt will be  able to be I with HEP for cervical ROM, UE and LE strength    Time  6    Period  Weeks    Status  New    Target Date  10/21/18      PT LONG TERM GOAL #4   Title  Pt will increase cervical AROM by 10 or more degrees in each direction, no lasting pain for improved driving, travel.     Baseline  see objective    Time  6    Period  Weeks    Status  New    Target Date  10/21/18      PT LONG TERM GOAL #5   Title  Functional strength goals TBA including sit to stand time, DGI    Time  6    Period  Weeks    Status  New    Target Date  10/21/18             Plan - 09/09/18 1518    Clinical Impression Statement  Patient presents for mod complexity eval for Rt sided neck pain, Rt hip pain as well as gait abnormality and increased frequency of falls.  She has improved with previous PT visits, but I expect her to continue to require the use of a cane and gain minimal ROM in cervical spine, hips as she has increased muscle tone and neurological involvement.      Personal Factors and Comorbidities  Comorbidity 1;Comorbidity 3+;Past/Current Experience;Comorbidity 2    Comorbidities  DM, heart disease, cervical myelopathy     Examination-Activity Limitations  Squat;Stairs;Lift;Bend;Locomotion Level;Stand;Transfers    Examination-Participation Restrictions  Community Activity;Shop    Stability/Clinical Decision Making  Evolving/Moderate complexity    Clinical Decision Making  Moderate    Rehab Potential  Good    PT Frequency  2x / week    PT Duration  6 weeks    PT Treatment/Interventions  ADLs/Self Care Home Management;DME Instruction;Balance training;Passive range of motion;Neuromuscular re-education;Gait training;Dry needling;Manual techniques;Taping;Functional mobility training;Moist Heat;Electrical  Stimulation;Therapeutic exercise;Patient/family education;Therapeutic activities    PT Next Visit Plan  develop , review HEP, manual/DN to cervicals and Rt hip , test functional strength (DGI, sit to stand x 30)    PT Home Exercise Plan  has previous    Consulted and Agree with Plan of Care  Patient       Patient will benefit from skilled therapeutic intervention in order to improve the following deficits and impairments:  Abnormal gait, Increased fascial restricitons, Impaired sensation, Pain, Improper body mechanics, Postural dysfunction, Increased muscle spasms, Decreased coordination, Decreased mobility, Decreased activity tolerance, Decreased endurance, Decreased range of motion, Decreased strength, Impaired UE functional use, Obesity, Impaired flexibility, Increased edema, Difficulty walking, Decreased balance  Visit Diagnosis: Other abnormalities of gait and mobility  Muscle weakness (generalized)  Cervicalgia  Pain in right hip     Problem List Patient Active Problem List   Diagnosis Date Noted  . OSA (obstructive sleep apnea) 10/16/2017  . Cervical stenosis of spinal canal 10/23/2016  . Neck swelling 09/28/2011  . Weight gain 01/02/2011  . Carpal tunnel syndrome 11/02/2010  . Leg swelling 08/26/2010  . ADRENAL MASS, LEFT 05/23/2010  . SPINAL STENOSIS, LUMBAR 12/27/2009  . B12 DEFICIENCY 06/10/2009  . ANEMIA 06/10/2009  . BACK PAIN, LUMBAR 11/06/2008  . KNEE PAIN, LEFT 02/03/2008  . DIABETES MELLITUS, TYPE II, UNCONTROLLED 01/12/2008  . MENOPAUSE-RELATED VASOMOTOR SYMPTOMS, HOT FLASHES 06/23/2007  . BURSITIS, LEFT HIP 06/23/2007  . HYPERLIPIDEMIA 03/25/2007  . ANXIETY 03/25/2007  .  DEPRESSION 03/25/2007  . PERIPHERAL NEUROPATHY 03/25/2007  . HYPERTENSION 03/25/2007  . CORONARY ARTERY DISEASE 03/25/2007  . DIVERTICULOSIS, COLON 03/25/2007  . TUBULOVILLOUS ADENOMA, COLON 02/18/2007    PAA,JENNIFER 09/09/2018, 3:30 PM  [facility] [address] [place], [name], [address] Phone: [phone]   Fax:  [phone]  Name: Raphael Espe MRN: [id number] Date of Birth: [birth year]   Raeford Razor, PT 09/09/18 3:31 PM Phone: [phone] Fax: [phone]

## 2018-09-09 NOTE — Telephone Encounter (Signed)
Rx has been changed and sent to pt pharmacy for refill

## 2018-09-12 NOTE — Telephone Encounter (Signed)
Please advise. I do not see this on the patients medication list. Is this PA still needed?

## 2018-09-12 NOTE — Telephone Encounter (Signed)
This Rx was changed to Lantus Kwik pen and was sent to pt pharmacy.

## 2018-09-20 ENCOUNTER — Other Ambulatory Visit: Payer: Self-pay

## 2018-09-20 ENCOUNTER — Encounter: Payer: Self-pay | Admitting: Physical Therapy

## 2018-09-20 ENCOUNTER — Ambulatory Visit: Payer: Medicare Other | Attending: Neurosurgery | Admitting: Physical Therapy

## 2018-09-20 DIAGNOSIS — M6281 Muscle weakness (generalized): Secondary | ICD-10-CM | POA: Insufficient documentation

## 2018-09-20 DIAGNOSIS — M542 Cervicalgia: Secondary | ICD-10-CM | POA: Insufficient documentation

## 2018-09-20 DIAGNOSIS — R2689 Other abnormalities of gait and mobility: Secondary | ICD-10-CM | POA: Diagnosis present

## 2018-09-20 DIAGNOSIS — M25551 Pain in right hip: Secondary | ICD-10-CM | POA: Diagnosis present

## 2018-09-20 NOTE — Therapy (Signed)
[place], [name], [address] Phone: [phone]   Fax:  [phone]  Physical Therapy Treatment  Patient Details  Name: [name] Bean MRN: [id number] Date of Birth: 1950-09-25 Referring Provider (PT): Dr. Christella Noa    Encounter Date: 09/20/2018  PT End of Session - 09/20/18 1332    Visit Number  2    Number of Visits  12    Date for PT Re-Evaluation  10/21/18    PT Start Time  1240    PT Stop Time  1328    PT Time Calculation (min)  48 min    Activity Tolerance  Patient tolerated treatment well    Behavior During Therapy  [facility] for tasks assessed/performed       Past Medical History:  Diagnosis Date  . Anemia   . Anxiety   . Arthritis   . Asthma   . Broken arm    left arm  . Bronchitis   . Bursitis of left hip   . CAD (coronary artery disease)   . Candidiasis, vagina   . Cardiac arrest ([place]) 06/20/2016   at [facility]; ? due to flash pulm edema vs undertreated chronic CHF  . Cerumen impaction   . CHF (congestive heart failure) ([place])    RECENT ADMIT TO [facility]   . Colon, diverticulosis   . Depression   . Diabetes mellitus    15 YRS AGO  . Fatigue   . Gastroenteritis   . Hot flashes, menopausal   . Hyperlipidemia   . Knee pain, left   . Lumbar back pain   . Maxillary sinusitis    history  . Muscle tear    right gluteus  . Nausea   . OSA (obstructive sleep apnea) 10/16/2017  . Other B-complex deficiencies   . Otitis media, acute    left  . Peripheral neuropathy   . Spinal stenosis of lumbar region   . Tubulovillous adenoma of colon   . Unspecified essential hypertension     Past Surgical History:  Procedure Laterality Date  . CARDIAC CATHETERIZATION  2015, 2009, 2006  . CERVICAL SPINE SURGERY  2003  . CORONARY ANGIOPLASTY WITH STENT PLACEMENT  2002, 2015  . EYE SURGERY     cataracts bilaterally  . POSTERIOR CERVICAL LAMINECTOMY N/A 10/23/2016   Procedure: POSTERIOR CERVICAL  LAMINECTOMY MULTI LEVEL CERVICAL TWO- CERVICAL THREE, CERVICAL THREE- CERVICAL FOUR;  Surgeon: Ashok Pall, MD;  Location: [facility];  Service: Neurosurgery;  Laterality: N/A;  POSTERIOR    There were no vitals filed for this visit.  Subjective Assessment - 09/20/18 1242    Subjective  A bit late.  I haven't had pain the last 2 days. Its cause its warm out.     Currently in Pain?  No/denies         [facility] PT Assessment - 09/20/18 0001      Standardized Balance Assessment   Five times sit to stand comments   14 sec, 30 sec x 12 reps         [facility] Adult PT Treatment/Exercise - 09/20/18 0001      Standardized Balance Assessment   Standardized Balance Assessment  Dynamic Gait Index;Timed Up and Go Test      Dynamic Gait Index   Level Surface  Moderate Impairment    Change in Gait Speed  Moderate Impairment    Gait with Horizontal Head Turns  Moderate Impairment    Gait with Vertical Head Turns  Moderate Impairment    Gait and Pivot Turn  Severe Impairment    Step Over Obstacle  Moderate Impairment    Step Around Obstacles  Moderate Impairment    Steps  Mild Impairment    Total Score  8      Timed Up and Go Test   Normal TUG (seconds)  17.6   with cane 18 sec      Neck Exercises: Supine   Neck Retraction  10 reps;5 secs    Cervical Rotation  10 reps    Shoulder Flexion  10 reps    Shoulder Flexion Weights (lbs)  1    Other Supine Exercise  x 10 , supine chin tuck with horizontal pull 1 lbs       Neck Exercises: Stabilization   Stabilization  chin tuck       Neck Exercises: Stretches   Upper Trapezius Stretch  2 reps    Levator Stretch  2 reps                  PT Long Term Goals - 09/09/18 1509      PT LONG TERM GOAL #1   Title  Pt will be able to walk in the community as needed (with cart) and decreased Rt LE pain for shopping.     Time  6    Period  Weeks    Status  New    Target Date  10/21/18      PT LONG TERM GOAL #2   Title  Pt will be able to  report no falls for >2 consecutive weeks and improved confidence     Time  6    Period  Weeks    Status  New    Target Date  10/21/18      PT LONG TERM GOAL #3   Title  Pt will be able to be I with HEP for cervical ROM, UE and LE strength    Time  6    Period  Weeks    Status  New    Target Date  10/21/18      PT LONG TERM GOAL #4   Title  Pt will increase cervical AROM by 10 or more degrees in each direction, no lasting pain for improved driving, travel.     Baseline  see objective    Time  6    Period  Weeks    Status  New    Target Date  10/21/18      PT LONG TERM GOAL #5   Title  Functional strength goals TBA including sit to stand time, DGI    Time  6    Period  Weeks    Status  New    Target Date  10/21/18            Plan - 09/20/18 1243    Clinical Impression Statement  Multiple functional tests done today.  Mod impairment at baseline.  Lacks hip flexibility , UE flexibility and dmeonstrates clonus in Rt LE with swing phase of gait.  Noted increased Rt neck pain post session. GIven HEP refresher.      PT Treatment/Interventions  ADLs/Self Care Home Management;DME Instruction;Balance training;Passive range of motion;Neuromuscular re-education;Gait training;Dry needling;Manual techniques;Taping;Functional mobility training;Moist Heat;Electrical Stimulation;Therapeutic exercise;Patient/family education;Therapeutic activities    PT Next Visit Plan  develop/ review HEP, manual/DN to cervicals and Rt hip    PT Home Exercise Plan  chin tuck, bands in supine (flexion/h abd), neck stretches,  piriformis     Consulted and Agree with Plan of Care  Patient       Patient will benefit from skilled therapeutic intervention in order to improve the following deficits and impairments:  Abnormal gait, Increased fascial restricitons, Impaired sensation, Pain, Improper body mechanics, Postural dysfunction, Increased muscle spasms, Decreased coordination, Decreased mobility, Decreased  activity tolerance, Decreased endurance, Decreased range of motion, Decreased strength, Impaired UE functional use, Obesity, Impaired flexibility, Increased edema, Difficulty walking, Decreased balance  Visit Diagnosis: Other abnormalities of gait and mobility  Muscle weakness (generalized)  Cervicalgia  Pain in right hip     Problem List Patient Active Problem List   Diagnosis Date Noted  . OSA (obstructive sleep apnea) 10/16/2017  . Cervical stenosis of spinal canal 10/23/2016  . Neck swelling 09/28/2011  . Weight gain 01/02/2011  . Carpal tunnel syndrome 11/02/2010  . Leg swelling 08/26/2010  . ADRENAL MASS, LEFT 05/23/2010  . SPINAL STENOSIS, LUMBAR 12/27/2009  . B12 DEFICIENCY 06/10/2009  . ANEMIA 06/10/2009  . BACK PAIN, LUMBAR 11/06/2008  . KNEE PAIN, LEFT 02/03/2008  . DIABETES MELLITUS, TYPE II, UNCONTROLLED 01/12/2008  . MENOPAUSE-RELATED VASOMOTOR SYMPTOMS, HOT FLASHES 06/23/2007  . BURSITIS, LEFT HIP 06/23/2007  . HYPERLIPIDEMIA 03/25/2007  . ANXIETY 03/25/2007  . DEPRESSION 03/25/2007  . PERIPHERAL NEUROPATHY 03/25/2007  . HYPERTENSION 03/25/2007  . CORONARY ARTERY DISEASE 03/25/2007  . DIVERTICULOSIS, COLON 03/25/2007  . TUBULOVILLOUS ADENOMA, COLON 02/18/2007    PAA,JENNIFER 09/20/2018, 2:25 PM  [facility] [address] [place], [name], [address] Phone: [phone]   Fax:  [phone]  Name: [name] Bean MRN: [id number] Date of Birth: Sep 25, 1950  Raeford Razor, PT 09/20/18 2:25 PM Phone: [phone] Fax: [phone]

## 2018-09-20 NOTE — Patient Instructions (Signed)
Step 1  Step 2  Upper Trapezius Stretch reps: 3  sets: 1  hold: 30  daily: 2  weekly: 7 Setup  Begin in a standing upright position. Place one arm behind your back, and grab it just above the wrist with your other hand. Movement  Gently bend your neck sideways, then pull your arm downward. You should feel a stretch in your neck and upper back. Tip  Make sure to keep your movements gentle and do not stretch through pain. Step 1  Step 2  Neck Rotation reps: 3  sets: 1  hold: 30  daily: 2  weekly: 7 Setup  Begin sitting upright. Movement  Turn your head to look over one shoulder, then return to the starting position and repeat to the other side. Tip  Make sure to move your head at the same speed as your eyes and stop if you start feeling dizzy. You should not feel any strain or pain. Step 1  Step 2  Supine Chin Tuck reps: 10  sets: 1  hold: 10  daily: 2  weekly: 7 Setup  Begin lying on your back with your neck relaxed. Movement  Gently tuck your chin directly backward as if you are making a double chin. Hold, then relax and repeat. Tip  Make sure not to lift your head from the ground. Step 1  Step 2  Supine Shoulder Horizontal Abduction with Resistance reps: 10  sets: 2  hold: 5  daily: 2  weekly: 7 Setup  Setup Directions Movement  Begin lying on your back with your knees bent and the ends of a resistance band in each hand. Your arms should be straight up toward the ceiling.  Tip  Pull your arms apart against the resistance band, straight out to your sides, then slowly bring them back to the starting position and repeat.  Step 1  Step 2  Supine Alternating Shoulder Flexion reps: 10  sets: 2  hold: 5  daily: 2  weekly: 7 Setup  Begin lying on your back with your knees bent and your feet flat on the ground. Place both arms straight upward. Movement  Slowly lower one of your arms over your head until you feel a stretch or your back begins to arch, then return  your arm to the starting position, and repeat with your opposite arm. Tip  Make sure to keep your trunk stiff and do not let your low back arch during the exercise.   Step 1  Step 2  Supine Piriformis Stretch with Towel reps: 3  sets: 2  hold: 30  daily: 2  weekly: 7 Setup  Begin lying on your back, holding the ends of a towel that is looped underneath your knee. Movement  Keeping your opposite leg straight, pull on the towel to bring your knee up toward your opposite shoulder, until you feel a stretch in your buttock muscles. Hold. Tip  Make sure to keep your movements slow and controlled.

## 2018-09-22 ENCOUNTER — Ambulatory Visit: Payer: Medicare Other | Admitting: Physical Therapy

## 2018-09-22 ENCOUNTER — Other Ambulatory Visit: Payer: Self-pay

## 2018-09-22 ENCOUNTER — Encounter: Payer: Self-pay | Admitting: Physical Therapy

## 2018-09-22 DIAGNOSIS — M25551 Pain in right hip: Secondary | ICD-10-CM

## 2018-09-22 DIAGNOSIS — R2689 Other abnormalities of gait and mobility: Secondary | ICD-10-CM | POA: Diagnosis not present

## 2018-09-22 DIAGNOSIS — M542 Cervicalgia: Secondary | ICD-10-CM

## 2018-09-22 DIAGNOSIS — M6281 Muscle weakness (generalized): Secondary | ICD-10-CM

## 2018-09-22 NOTE — Therapy (Signed)
[place], [name], [address] Phone: [phone]   Fax:  [phone]  Physical Therapy Treatment  Patient Details  Name: [name] Bean MRN: [id number] Date of Birth: 26-Jun-1950 Referring Provider (PT): Dr. Christella Noa    Encounter Date: 09/22/2018  PT End of Session - 09/22/18 1644    Visit Number  3    Number of Visits  12    Date for PT Re-Evaluation  10/21/18    PT Start Time  1602    PT Stop Time  1642    PT Time Calculation (min)  40 min    Activity Tolerance  Patient tolerated treatment well    Behavior During Therapy  [facility] for tasks assessed/performed       Past Medical History:  Diagnosis Date  . Anemia   . Anxiety   . Arthritis   . Asthma   . Broken arm    left arm  . Bronchitis   . Bursitis of left hip   . CAD (coronary artery disease)   . Candidiasis, vagina   . Cardiac arrest ([place]) 06/20/2016   at [facility]; ? due to flash pulm edema vs undertreated chronic CHF  . Cerumen impaction   . CHF (congestive heart failure) ([place])    RECENT ADMIT TO [facility]   . Colon, diverticulosis   . Depression   . Diabetes mellitus    15 YRS AGO  . Fatigue   . Gastroenteritis   . Hot flashes, menopausal   . Hyperlipidemia   . Knee pain, left   . Lumbar back pain   . Maxillary sinusitis    history  . Muscle tear    right gluteus  . Nausea   . OSA (obstructive sleep apnea) 10/16/2017  . Other B-complex deficiencies   . Otitis media, acute    left  . Peripheral neuropathy   . Spinal stenosis of lumbar region   . Tubulovillous adenoma of colon   . Unspecified essential hypertension     Past Surgical History:  Procedure Laterality Date  . CARDIAC CATHETERIZATION  2015, 2009, 2006  . CERVICAL SPINE SURGERY  2003  . CORONARY ANGIOPLASTY WITH STENT PLACEMENT  2002, 2015  . EYE SURGERY     cataracts bilaterally  . POSTERIOR CERVICAL LAMINECTOMY N/A 10/23/2016   Procedure: POSTERIOR CERVICAL  LAMINECTOMY MULTI LEVEL CERVICAL TWO- CERVICAL THREE, CERVICAL THREE- CERVICAL FOUR;  Surgeon: Ashok Pall, MD;  Location: [facility];  Service: Neurosurgery;  Laterality: N/A;  POSTERIOR    There were no vitals filed for this visit.  Subjective Assessment - 09/22/18 1608    Subjective  No pain right now.    Currently in Pain?  No/denies          [facility] Adult PT Treatment/Exercise - 09/22/18 0001      Neck Exercises: Machines for Strengthening   Nustep  L5 UE and LE for 5 min       Shoulder Exercises: Supine   Flexion  AAROM;Strengthening;Both;15 reps    Other Supine Exercises  supine cane press up x 10, overhead x 10       Shoulder Exercises: Seated   Horizontal ABduction  Strengthening;Both;10 reps    Theraband Level (Shoulder Horizontal ABduction)  Level 2 (Red)      Shoulder Exercises: Standing   Flexion  Strengthening;Both;15 reps    Theraband Level (Shoulder Flexion)  Level 2 (Red)    Extension  Strengthening;Both;15 reps    Theraband Level (Shoulder  Extension)  Level 2 (Red)    Row  Strengthening;Both;15 reps    Theraband Level (Shoulder Row)  Level 2 (Red)    Other Standing Exercises  wall slides x 15 (LE) then facing wall x 10 limited ROM in UEs       Shoulder Exercises: Stretch   Other Shoulder Stretches  supine trunk rotation with head turns x 10       Manual Therapy   Manual Therapy  Passive ROM    Passive ROM  bilateral UEs all planes to tolerance              PT Long Term Goals - 09/09/18 1509      PT LONG TERM GOAL #1   Title  Pt will be able to walk in the community as needed (with cart) and decreased Rt LE pain for shopping.     Time  6    Period  Weeks    Status  New    Target Date  10/21/18      PT LONG TERM GOAL #2   Title  Pt will be able to report no falls for >2 consecutive weeks and improved confidence     Time  6    Period  Weeks    Status  New    Target Date  10/21/18      PT LONG TERM GOAL #3   Title  Pt will be able to be I  with HEP for cervical ROM, UE and LE strength    Time  6    Period  Weeks    Status  New    Target Date  10/21/18      PT LONG TERM GOAL #4   Title  Pt will increase cervical AROM by 10 or more degrees in each direction, no lasting pain for improved driving, travel.     Baseline  see objective    Time  6    Period  Weeks    Status  New    Target Date  10/21/18      PT LONG TERM GOAL #5   Title  Functional strength goals TBA including sit to stand time, DGI    Time  6    Period  Weeks    Status  New    Target Date  10/21/18            Plan - 09/22/18 1645    Clinical Impression Statement  Worked on general shoulder ROM, strength.  PROM with pain end range (>100 deg) flexion and abduction (90 deg) .  No pain lately due to the warm weather    Personal Factors and Comorbidities  Behavior Pattern    PT Treatment/Interventions  ADLs/Self Care Home Management;DME Instruction;Balance training;Passive range of motion;Neuromuscular re-education;Gait training;Dry needling;Manual techniques;Taping;Functional mobility training;Moist Heat;Electrical Stimulation;Therapeutic exercise;Patient/family education;Therapeutic activities    PT Next Visit Plan  develop/ review HEP, manual/DN to cervicals and Rt hip    PT Home Exercise Plan  chin tuck, bands in supine (flexion/h abd), neck stretches, piriformis     Consulted and Agree with Plan of Care  Patient       Patient will benefit from skilled therapeutic intervention in order to improve the following deficits and impairments:           Problem List Patient Active Problem List   Diagnosis Date Noted  . OSA (obstructive sleep apnea) 10/16/2017  . Cervical stenosis of spinal canal 10/23/2016  . Neck swelling 09/28/2011  .  Weight gain 01/02/2011  . Carpal tunnel syndrome 11/02/2010  . Leg swelling 08/26/2010  . ADRENAL MASS, LEFT 05/23/2010  . SPINAL STENOSIS, LUMBAR 12/27/2009  . B12 DEFICIENCY 06/10/2009  . ANEMIA 06/10/2009   . BACK PAIN, LUMBAR 11/06/2008  . KNEE PAIN, LEFT 02/03/2008  . DIABETES MELLITUS, TYPE II, UNCONTROLLED 01/12/2008  . MENOPAUSE-RELATED VASOMOTOR SYMPTOMS, HOT FLASHES 06/23/2007  . BURSITIS, LEFT HIP 06/23/2007  . HYPERLIPIDEMIA 03/25/2007  . ANXIETY 03/25/2007  . DEPRESSION 03/25/2007  . PERIPHERAL NEUROPATHY 03/25/2007  . HYPERTENSION 03/25/2007  . CORONARY ARTERY DISEASE 03/25/2007  . DIVERTICULOSIS, COLON 03/25/2007  . TUBULOVILLOUS ADENOMA, COLON 02/18/2007    PAA,JENNIFER 09/22/2018, 4:53 PM  [facility] [address] [place], [name], [address] Phone: [phone]   Fax:  [phone]  Name: [name] Bean MRN: [id number] Date of Birth: 1951/01/08  Raeford Razor, PT 09/22/18 4:53 PM Phone: [phone] Fax: [phone]

## 2018-09-26 ENCOUNTER — Ambulatory Visit: Payer: Medicare Other | Admitting: Physical Therapy

## 2018-09-26 ENCOUNTER — Telehealth: Payer: Self-pay | Admitting: Physical Therapy

## 2018-09-26 NOTE — Telephone Encounter (Signed)
Called patient regarding no show for appointment this PM. She reports forgot appointment/thought it was for tomorrow. Confirmed next scheduled appointment.

## 2018-09-28 ENCOUNTER — Other Ambulatory Visit: Payer: Self-pay

## 2018-09-28 ENCOUNTER — Ambulatory Visit: Payer: Medicare Other | Admitting: Physical Therapy

## 2018-09-28 DIAGNOSIS — M25551 Pain in right hip: Secondary | ICD-10-CM

## 2018-09-28 DIAGNOSIS — R2689 Other abnormalities of gait and mobility: Secondary | ICD-10-CM | POA: Diagnosis not present

## 2018-09-28 DIAGNOSIS — M542 Cervicalgia: Secondary | ICD-10-CM

## 2018-09-28 DIAGNOSIS — M6281 Muscle weakness (generalized): Secondary | ICD-10-CM

## 2018-09-28 NOTE — Therapy (Signed)
[place], [name], [address] Phone: [phone]   Fax:  [phone]  Physical Therapy Treatment  Patient Details  Name: [name] Bean MRN: [id number] Date of Birth: 29-Jun-1950 Referring Provider (PT): Dr. Christella Noa    Encounter Date: 09/28/2018  PT End of Session - 09/28/18 1401    Visit Number  4    Number of Visits  12    Date for PT Re-Evaluation  10/21/18    PT Start Time  1318    PT Stop Time  1409   time spent dry needling x 3 min as well as 10 min MHP time not included in direct minutes   PT Time Calculation (min)  51 min    Activity Tolerance  Patient tolerated treatment well    Behavior During Therapy  [facility] for tasks assessed/performed       Past Medical History:  Diagnosis Date  . Anemia   . Anxiety   . Arthritis   . Asthma   . Broken arm    left arm  . Bronchitis   . Bursitis of left hip   . CAD (coronary artery disease)   . Candidiasis, vagina   . Cardiac arrest ([place]) 06/20/2016   at [facility]; ? due to flash pulm edema vs undertreated chronic CHF  . Cerumen impaction   . CHF (congestive heart failure) ([place])    RECENT ADMIT TO [facility]   . Colon, diverticulosis   . Depression   . Diabetes mellitus    15 YRS AGO  . Fatigue   . Gastroenteritis   . Hot flashes, menopausal   . Hyperlipidemia   . Knee pain, left   . Lumbar back pain   . Maxillary sinusitis    history  . Muscle tear    right gluteus  . Nausea   . OSA (obstructive sleep apnea) 10/16/2017  . Other B-complex deficiencies   . Otitis media, acute    left  . Peripheral neuropathy   . Spinal stenosis of lumbar region   . Tubulovillous adenoma of colon   . Unspecified essential hypertension     Past Surgical History:  Procedure Laterality Date  . CARDIAC CATHETERIZATION  2015, 2009, 2006  . CERVICAL SPINE SURGERY  2003  . CORONARY ANGIOPLASTY WITH STENT PLACEMENT  2002, 2015  . EYE SURGERY     cataracts  bilaterally  . POSTERIOR CERVICAL LAMINECTOMY N/A 10/23/2016   Procedure: POSTERIOR CERVICAL LAMINECTOMY MULTI LEVEL CERVICAL TWO- CERVICAL THREE, CERVICAL THREE- CERVICAL FOUR;  Surgeon: Ashok Pall, MD;  Location: [place];  Service: Neurosurgery;  Laterality: N/A;  POSTERIOR    There were no vitals filed for this visit.  Subjective Assessment - 09/28/18 1321    Subjective  Pt. reports some exacerbation of neck pain with colder weather. Pain into right upper trapezius region.    Pertinent History  CAD, cervical fusion and calcification of ligament, ataxia, CHF, HTN, DM    Limitations  Lifting;Standing;Walking;House hold activities    Currently in Pain?  Yes    Pain Score  2     Pain Location  Neck    Pain Orientation  Right    Pain Descriptors / Indicators  Burning    Pain Type  Chronic pain    Pain Radiating Towards  right upper trapezius    Pain Onset  More than a month ago    Pain Frequency  Intermittent    Aggravating Factors   cold weather  Pain Relieving Factors  warmth, PT, STM    Effect of Pain on Daily Activities  limits positional tolerance, ability ADLs at home                       [facility] Adult PT Treatment/Exercise - 09/28/18 0001      Shoulder Exercises: Standing   Flexion  Strengthening;Both;20 reps   bilat. [place] flexion   Theraband Level (Shoulder Flexion)  Level 2 (Red)    Extension  Strengthening;Both;20 reps    Theraband Level (Shoulder Extension)  Level 2 (Red)    Row  Strengthening;Both;20 reps    Theraband Level (Shoulder Row)  Level 3 (Green)    Other Standing Exercises  wall slides with pillow cases over hands 2x10      Modalities   Modalities  Moist Heat      Moist Heat Therapy   Number Minutes Moist Heat  10 Minutes    Moist Heat Location  Cervical      Manual Therapy   Manual Therapy  Soft tissue mobilization;Manual Traction    Soft tissue mobilization  seated + supine STM right upper trapezius and levator    Manual  Traction  gentle manual traction and suboccipital release      Neck Exercises: Stretches   Upper Trapezius Stretch  Right;3 reps;30 seconds   supine manual stretch on right      Trigger Point Dry Needling - 09/28/18 0001    Consent Given?  Yes    Education Handout Provided  --   previously given   Muscles Treated Head and Neck  Upper trapezius    Dry Needling Comments  right upper trapezius needled in left sidelying with 32 gauge 40 mm needles x 3 min           PT Education - 09/28/18 1401    Education Details  POC,    Person(s) Educated  Patient    Methods  Explanation    Comprehension  Verbalized understanding          PT Long Term Goals - 09/09/18 1509      PT LONG TERM GOAL #1   Title  Pt will be able to walk in the community as needed (with cart) and decreased Rt LE pain for shopping.     Time  6    Period  Weeks    Status  New    Target Date  10/21/18      PT LONG TERM GOAL #2   Title  Pt will be able to report no falls for >2 consecutive weeks and improved confidence     Time  6    Period  Weeks    Status  New    Target Date  10/21/18      PT LONG TERM GOAL #3   Title  Pt will be able to be I with HEP for cervical ROM, UE and LE strength    Time  6    Period  Weeks    Status  New    Target Date  10/21/18      PT LONG TERM GOAL #4   Title  Pt will increase cervical AROM by 10 or more degrees in each direction, no lasting pain for improved driving, travel.     Baseline  see objective    Time  6    Period  Weeks    Status  New    Target Date  10/21/18  PT LONG TERM GOAL #5   Title  Functional strength goals TBA including sit to stand time, DGI    Time  6    Period  Weeks    Status  New    Target Date  10/21/18            Plan - 09/28/18 1403    Clinical Impression Statement  Tx. focus manual therapy and continued exercise progression for stretches and postural strengthening. Cues for avoidance shrug with Therband exercises. Very  tight right upper trap region wih decreased tension noted following STM. Brief dry needling trial also to right upper trapezius region which was well-tolerated.    Comorbidities  DM, heart disease, cervical myelopathy     Examination-Activity Limitations  Squat;Stairs;Lift;Bend;Locomotion Level;Stand;Transfers    Examination-Participation Restrictions  Community Activity;Shop    Stability/Clinical Decision Making  Evolving/Moderate complexity    Clinical Decision Making  Moderate    Rehab Potential  Good    PT Frequency  2x / week    PT Duration  6 weeks    PT Treatment/Interventions  ADLs/Self Care Home Management;DME Instruction;Balance training;Passive range of motion;Neuromuscular re-education;Gait training;Dry needling;Manual techniques;Taping;Functional mobility training;Moist Heat;Electrical Stimulation;Therapeutic exercise;Patient/family education;Therapeutic activities    PT Next Visit Plan  develop/ review HEP, manual/DN to cervicals    PT Home Exercise Plan  chin tuck, bands in supine (flexion/h abd), neck stretches, piriformis     Consulted and Agree with Plan of Care  Patient       Patient will benefit from skilled therapeutic intervention in order to improve the following deficits and impairments:  Abnormal gait, Increased fascial restricitons, Impaired sensation, Pain, Improper body mechanics, Postural dysfunction, Increased muscle spasms, Decreased coordination, Decreased mobility, Decreased activity tolerance, Decreased endurance, Decreased range of motion, Decreased strength, Impaired UE functional use, Obesity, Impaired flexibility, Increased edema, Difficulty walking, Decreased balance  Visit Diagnosis: 1. Other abnormalities of gait and mobility   2. Cervicalgia   3. Pain in right hip   4. Muscle weakness (generalized)        Problem List Patient Active Problem List   Diagnosis Date Noted  . OSA (obstructive sleep apnea) 10/16/2017  . Cervical stenosis of spinal  canal 10/23/2016  . Neck swelling 09/28/2011  . Weight gain 01/02/2011  . Carpal tunnel syndrome 11/02/2010  . Leg swelling 08/26/2010  . ADRENAL MASS, LEFT 05/23/2010  . SPINAL STENOSIS, LUMBAR 12/27/2009  . B12 DEFICIENCY 06/10/2009  . ANEMIA 06/10/2009  . BACK PAIN, LUMBAR 11/06/2008  . KNEE PAIN, LEFT 02/03/2008  . DIABETES MELLITUS, TYPE II, UNCONTROLLED 01/12/2008  . MENOPAUSE-RELATED VASOMOTOR SYMPTOMS, HOT FLASHES 06/23/2007  . BURSITIS, LEFT HIP 06/23/2007  . HYPERLIPIDEMIA 03/25/2007  . ANXIETY 03/25/2007  . DEPRESSION 03/25/2007  . PERIPHERAL NEUROPATHY 03/25/2007  . HYPERTENSION 03/25/2007  . CORONARY ARTERY DISEASE 03/25/2007  . DIVERTICULOSIS, COLON 03/25/2007  . TUBULOVILLOUS ADENOMA, COLON 02/18/2007    Beaulah Dinning, PT, DPT 09/28/18 2:11 PM  [facility] [address] [place], [name], [address] Phone: [phone]   Fax:  [phone]  Name: [name] Bean MRN: [id number] Date of Birth: May 04, 1950

## 2018-10-03 ENCOUNTER — Encounter: Payer: Self-pay | Admitting: Physical Therapy

## 2018-10-03 ENCOUNTER — Other Ambulatory Visit: Payer: Self-pay

## 2018-10-03 ENCOUNTER — Ambulatory Visit: Payer: Medicare Other | Admitting: Physical Therapy

## 2018-10-03 DIAGNOSIS — M25551 Pain in right hip: Secondary | ICD-10-CM

## 2018-10-03 DIAGNOSIS — M542 Cervicalgia: Secondary | ICD-10-CM

## 2018-10-03 DIAGNOSIS — R2689 Other abnormalities of gait and mobility: Secondary | ICD-10-CM

## 2018-10-03 DIAGNOSIS — M6281 Muscle weakness (generalized): Secondary | ICD-10-CM

## 2018-10-03 NOTE — Therapy (Signed)
[place], [name], [address] Phone: [phone]   Fax:  [phone]  Physical Therapy Treatment  Patient Details  Name: [name] Bean MRN: [id number] Date of Birth: 20-Sep-1950 Referring Provider (PT): Dr. Christella Noa    Encounter Date: 10/03/2018  PT End of Session - 10/03/18 1304    Visit Number  5    Number of Visits  12    Date for PT Re-Evaluation  10/21/18    PT Start Time  1233    PT Stop Time  1320    PT Time Calculation (min)  47 min    Activity Tolerance  Patient tolerated treatment well    Behavior During Therapy  [facility] for tasks assessed/performed       Past Medical History:  Diagnosis Date  . Anemia   . Anxiety   . Arthritis   . Asthma   . Broken arm    left arm  . Bronchitis   . Bursitis of left hip   . CAD (coronary artery disease)   . Candidiasis, vagina   . Cardiac arrest ([place]) 06/20/2016   at [facility]; ? due to flash pulm edema vs undertreated chronic CHF  . Cerumen impaction   . CHF (congestive heart failure) ([place])    RECENT ADMIT TO [facility]   . Colon, diverticulosis   . Depression   . Diabetes mellitus    15 YRS AGO  . Fatigue   . Gastroenteritis   . Hot flashes, menopausal   . Hyperlipidemia   . Knee pain, left   . Lumbar back pain   . Maxillary sinusitis    history  . Muscle tear    right gluteus  . Nausea   . OSA (obstructive sleep apnea) 10/16/2017  . Other B-complex deficiencies   . Otitis media, acute    left  . Peripheral neuropathy   . Spinal stenosis of lumbar region   . Tubulovillous adenoma of colon   . Unspecified essential hypertension     Past Surgical History:  Procedure Laterality Date  . CARDIAC CATHETERIZATION  2015, 2009, 2006  . CERVICAL SPINE SURGERY  2003  . CORONARY ANGIOPLASTY WITH STENT PLACEMENT  2002, 2015  . EYE SURGERY     cataracts bilaterally  . POSTERIOR CERVICAL LAMINECTOMY N/A 10/23/2016   Procedure: POSTERIOR CERVICAL  LAMINECTOMY MULTI LEVEL CERVICAL TWO- CERVICAL THREE, CERVICAL THREE- CERVICAL FOUR;  Surgeon: Ashok Pall, MD;  Location: [place];  Service: Neurosurgery;  Laterality: N/A;  POSTERIOR    There were no vitals filed for this visit.  Subjective Assessment - 10/03/18 1240    Subjective  Tired today, hasn't eaten yet.  I think I have neuropathy in my R foot.    Currently in Pain?  No/denies   stiff        [facility] PT Assessment - 10/03/18 0001      AROM   Cervical Flexion  40    Cervical Extension  25    Cervical - Right Side Bend  25    Cervical - Left Side Bend  23    Cervical - Right Rotation  30    Cervical - Left Rotation  28        [facility] Adult PT Treatment/Exercise - 10/03/18 0001      Transfers   Comments         Shoulder Exercises: Supine   Other Supine Exercises  bridge x 10     Other Supine Exercises  overhead reach with circle x 10       Shoulder Exercises: Standing   Horizontal ABduction  Strengthening;Both;10 reps    Theraband Level (Shoulder Horizontal ABduction)  Level 1 (Yellow)    Shoulder Flexion Weight (lbs)  magic circle x 10 overhead lifts     Other Standing Exercises  wal squat x 10     Other Standing Exercises  wall slides with pillow cases over hands 2x10      Shoulder Exercises: ROM/Strengthening   Nustep  UE and LE L 5, 5 min       Manual Therapy   Manual therapy comments  IASTM to Rt     Soft tissue mobilization  seated Rt > Lt upper trap. posterior cervical       Neck Exercises: Stretches   Upper Trapezius Stretch  Right;Left;3 reps;30 seconds    Other Neck Stretches  cervical rotation x 5 with towel for AAROM    Other Neck Stretches  gentle extension x 5 with towel              PT Education - 10/03/18 1303    Education Details  gravity eliminated HEP    Person(s) Educated  Patient    Methods  Explanation    Comprehension  Verbalized understanding;Verbal cues required;Tactile cues required          PT Long Term Goals -  10/03/18 1305      PT LONG TERM GOAL #1   Title  Pt will be able to walk in the community as needed (with cart) and decreased Rt LE pain for shopping.     Status  Achieved      PT LONG TERM GOAL #2   Title  Pt will be able to report no falls for >2 consecutive weeks and improved confidence     Status  Achieved      PT LONG TERM GOAL #3   Title  Pt will be able to be I with HEP for cervical ROM, UE and LE strength    Status  On-going      PT LONG TERM GOAL #4   Title  Pt will increase cervical AROM by 10 or more degrees in each direction, no lasting pain for improved driving, travel.     Baseline  see flowsheet, better in all planes 3- 10 deg    Status  Partially Met      PT LONG TERM GOAL #5   Title  Functional strength goals TUG to 14 sec or better with cane    Status  Unable to assess            Plan - 10/03/18 1322    Clinical Impression Statement  Pt tired today, needed seated rest break, dyspnea (mask/chronic breathing issues).  AROM improved in all planes.  Goals in progress.  Pain has overall been better but she atributes to warmer weather.    PT Next Visit Plan  UBE vs Nustep,further HEP, manual/DN to cervicals    PT Home Exercise Plan  chin tuck, bands in supine (flexion/h abd), neck stretches, piriformis     Consulted and Agree with Plan of Care  Patient       Patient will benefit from skilled therapeutic intervention in order to improve the following deficits and impairments:  Abnormal gait, Increased fascial restricitons, Impaired sensation, Pain, Improper body mechanics, Postural dysfunction, Increased muscle spasms, Decreased coordination, Decreased mobility, Decreased activity tolerance, Decreased endurance, Decreased range of motion, Decreased strength, Impaired  UE functional use, Obesity, Impaired flexibility, Increased edema, Difficulty walking, Decreased balance  Visit Diagnosis: 1. Other abnormalities of gait and mobility   2. Cervicalgia   3. Pain in  right hip   4. Muscle weakness (generalized)        Problem List Patient Active Problem List   Diagnosis Date Noted  . OSA (obstructive sleep apnea) 10/16/2017  . Cervical stenosis of spinal canal 10/23/2016  . Neck swelling 09/28/2011  . Weight gain 01/02/2011  . Carpal tunnel syndrome 11/02/2010  . Leg swelling 08/26/2010  . ADRENAL MASS, LEFT 05/23/2010  . SPINAL STENOSIS, LUMBAR 12/27/2009  . B12 DEFICIENCY 06/10/2009  . ANEMIA 06/10/2009  . BACK PAIN, LUMBAR 11/06/2008  . KNEE PAIN, LEFT 02/03/2008  . DIABETES MELLITUS, TYPE II, UNCONTROLLED 01/12/2008  . MENOPAUSE-RELATED VASOMOTOR SYMPTOMS, HOT FLASHES 06/23/2007  . BURSITIS, LEFT HIP 06/23/2007  . HYPERLIPIDEMIA 03/25/2007  . ANXIETY 03/25/2007  . DEPRESSION 03/25/2007  . PERIPHERAL NEUROPATHY 03/25/2007  . HYPERTENSION 03/25/2007  . CORONARY ARTERY DISEASE 03/25/2007  . DIVERTICULOSIS, COLON 03/25/2007  . TUBULOVILLOUS ADENOMA, COLON 02/18/2007    , 10/03/2018, 1:28 PM  [facility] [address] [place], [name], [address] Phone: [phone]   Fax:  [phone]  Name: [name] Bean MRN: [id number] Date of Birth: 06/17/1950  Raeford Razor, PT 10/03/18 1:28 PM Phone: [phone] Fax: [phone]

## 2018-10-05 ENCOUNTER — Ambulatory Visit: Payer: Medicare Other | Admitting: Physical Therapy

## 2018-10-10 ENCOUNTER — Ambulatory Visit: Payer: Medicare Other | Admitting: Physical Therapy

## 2018-10-10 ENCOUNTER — Other Ambulatory Visit: Payer: Self-pay

## 2018-10-10 ENCOUNTER — Encounter: Payer: Self-pay | Admitting: Physical Therapy

## 2018-10-10 ENCOUNTER — Other Ambulatory Visit: Payer: Self-pay | Admitting: Family Medicine

## 2018-10-10 DIAGNOSIS — R2689 Other abnormalities of gait and mobility: Secondary | ICD-10-CM

## 2018-10-10 DIAGNOSIS — M25551 Pain in right hip: Secondary | ICD-10-CM

## 2018-10-10 DIAGNOSIS — I509 Heart failure, unspecified: Secondary | ICD-10-CM

## 2018-10-10 DIAGNOSIS — M542 Cervicalgia: Secondary | ICD-10-CM

## 2018-10-10 DIAGNOSIS — M6281 Muscle weakness (generalized): Secondary | ICD-10-CM

## 2018-10-10 NOTE — Therapy (Signed)
[place], [name], [address] Phone: [phone]   Fax:  [phone]  Physical Therapy Treatment  Patient Details  Name: [name] Bean MRN: [id number] Date of Birth: December 25, 1950 Referring Provider (PT): Dr. Christella Noa    Encounter Date: 10/10/2018  PT End of Session - 10/10/18 1325    Visit Number  6    Number of Visits  12    Date for PT Re-Evaluation  10/21/18    PT Start Time  5009    PT Stop Time  1330   5 min dry needling not included in direct minutes   PT Time Calculation (min)  55 min    Activity Tolerance  Patient tolerated treatment well    Behavior During Therapy  [facility] for tasks assessed/performed       Past Medical History:  Diagnosis Date  . Anemia   . Anxiety   . Arthritis   . Asthma   . Broken arm    left arm  . Bronchitis   . Bursitis of left hip   . CAD (coronary artery disease)   . Candidiasis, vagina   . Cardiac arrest ([place]) 06/20/2016   at [facility]; ? due to flash pulm edema vs undertreated chronic CHF  . Cerumen impaction   . CHF (congestive heart failure) ([place])    RECENT ADMIT TO [facility]   . Colon, diverticulosis   . Depression   . Diabetes mellitus    15 YRS AGO  . Fatigue   . Gastroenteritis   . Hot flashes, menopausal   . Hyperlipidemia   . Knee pain, left   . Lumbar back pain   . Maxillary sinusitis    history  . Muscle tear    right gluteus  . Nausea   . OSA (obstructive sleep apnea) 10/16/2017  . Other B-complex deficiencies   . Otitis media, acute    left  . Peripheral neuropathy   . Spinal stenosis of lumbar region   . Tubulovillous adenoma of colon   . Unspecified essential hypertension     Past Surgical History:  Procedure Laterality Date  . CARDIAC CATHETERIZATION  2015, 2009, 2006  . CERVICAL SPINE SURGERY  2003  . CORONARY ANGIOPLASTY WITH STENT PLACEMENT  2002, 2015  . EYE SURGERY     cataracts bilaterally  . POSTERIOR CERVICAL  LAMINECTOMY N/A 10/23/2016   Procedure: POSTERIOR CERVICAL LAMINECTOMY MULTI LEVEL CERVICAL TWO- CERVICAL THREE, CERVICAL THREE- CERVICAL FOUR;  Surgeon: Ashok Pall, MD;  Location: [place];  Service: Neurosurgery;  Laterality: N/A;  POSTERIOR    There were no vitals filed for this visit.  Subjective Assessment - 10/10/18 1234    Subjective  "Not a good day today, my neck is killing me....it's just stiff."    Currently in Pain?  Yes    Pain Score  4     Pain Location  Neck    Pain Orientation  Right    Pain Descriptors / Indicators  Burning    Pain Type  Chronic pain    Pain Radiating Towards  right upper trapezius    Pain Onset  More than a month ago    Pain Frequency  Intermittent    Aggravating Factors   cold weather    Pain Relieving Factors  heat, STM, PT    Effect of Pain on Daily Activities  limits positional tolerance,  ability ADLs at home  [place] Adult PT Treatment/Exercise - 10/10/18 0001      Shoulder Exercises: Standing   Horizontal ABduction  Strengthening;Both;10 reps    Theraband Level (Shoulder Horizontal ABduction)  Level 1 (Yellow)    Extension  Strengthening;Both;20 reps    Theraband Level (Shoulder Extension)  Level 2 (Red)    Row  Strengthening;Both;20 reps    Theraband Level (Shoulder Row)  Level 3 (Green)    Other Standing Exercises  wall slides with pillow cases over hands x10      Moist Heat Therapy   Number Minutes Moist Heat  10 Minutes    Moist Heat Location  Cervical      Manual Therapy   Soft tissue mobilization  seated +supine STM and MFR to right>left upper trapezius and levator     Manual Traction  gentle manual traction and suboccipital release      Neck Exercises: Stretches   Upper Trapezius Stretch  Right;3 reps;30 seconds    Levator Stretch  Right;3 reps;30 seconds       Trigger Point Dry Needling - 10/10/18 0001    Consent Given?  Yes    Muscles Treated Head and Neck  Upper trapezius    Dry  Needling Comments  right upper trapezius in left sidelying and briefly in sitting with 32 gauge 40 mm needles used           PT Education - 10/10/18 1324    Education Details  POC    Person(s) Educated  Patient    Methods  Explanation    Comprehension  Verbalized understanding          PT Long Term Goals - 10/03/18 1305      PT LONG TERM GOAL #1   Title  Pt will be able to walk in the community as needed (with cart) and decreased Rt LE pain for shopping.     Status  Achieved      PT LONG TERM GOAL #2   Title  Pt will be able to report no falls for >2 consecutive weeks and improved confidence     Status  Achieved      PT LONG TERM GOAL #3   Title  Pt will be able to be I with HEP for cervical ROM, UE and LE strength    Status  On-going      PT LONG TERM GOAL #4   Title  Pt will increase cervical AROM by 10 or more degrees in each direction, no lasting pain for improved driving, travel.     Baseline  see flowsheet, better in all planes 3- 10 deg    Status  Partially Met      PT LONG TERM GOAL #5   Title  Functional strength goals TUG to 14 sec or better with cane    Status  Unable to assess            Plan - 10/10/18 1326    Clinical Impression Statement  Fair status with progress-very tight right upper trapezius region eased with STM/manual tx. as well as brief dry needling. Left>right shoulder motion limited with standing scapular stabilization exercises, cervical ROM still limited for bilat. rotation.    Comorbidities  DM, heart disease, cervical myelopathy     Stability/Clinical Decision Making  Evolving/Moderate complexity    Clinical Decision Making  Moderate    Rehab Potential  Good    PT Frequency  2x / week    PT Duration  6 weeks  PT Treatment/Interventions  ADLs/Self Care Home Management;DME Instruction;Balance training;Passive range of motion;Neuromuscular re-education;Gait training;Dry needling;Manual techniques;Taping;Functional mobility  training;Moist Heat;Electrical Stimulation;Therapeutic exercise;Patient/family education;Therapeutic activities    PT Next Visit Plan  UBE vs Nustep,further HEP, manual/DN to cervicals    PT Home Exercise Plan  chin tuck, bands in supine (flexion/h abd), neck stretches, piriformis     Consulted and Agree with Plan of Care  Patient       Patient will benefit from skilled therapeutic intervention in order to improve the following deficits and impairments:  Abnormal gait, Increased fascial restricitons, Impaired sensation, Pain, Improper body mechanics, Postural dysfunction, Increased muscle spasms, Decreased coordination, Decreased mobility, Decreased activity tolerance, Decreased endurance, Decreased range of motion, Decreased strength, Impaired UE functional use, Obesity, Impaired flexibility, Increased edema, Difficulty walking, Decreased balance  Visit Diagnosis: 1. Other abnormalities of gait and mobility   2. Cervicalgia   3. Pain in right hip   4. Muscle weakness (generalized)        Problem List Patient Active Problem List   Diagnosis Date Noted  . OSA (obstructive sleep apnea) 10/16/2017  . Cervical stenosis of spinal canal 10/23/2016  . Neck swelling 09/28/2011  . Weight gain 01/02/2011  . Carpal tunnel syndrome 11/02/2010  . Leg swelling 08/26/2010  . ADRENAL MASS, LEFT 05/23/2010  . SPINAL STENOSIS, LUMBAR 12/27/2009  . B12 DEFICIENCY 06/10/2009  . ANEMIA 06/10/2009  . BACK PAIN, LUMBAR 11/06/2008  . KNEE PAIN, LEFT 02/03/2008  . DIABETES MELLITUS, TYPE II, UNCONTROLLED 01/12/2008  . MENOPAUSE-RELATED VASOMOTOR SYMPTOMS, HOT FLASHES 06/23/2007  . BURSITIS, LEFT HIP 06/23/2007  . HYPERLIPIDEMIA 03/25/2007  . ANXIETY 03/25/2007  . DEPRESSION 03/25/2007  . PERIPHERAL NEUROPATHY 03/25/2007  . HYPERTENSION 03/25/2007  . CORONARY ARTERY DISEASE 03/25/2007  . DIVERTICULOSIS, COLON 03/25/2007  . TUBULOVILLOUS ADENOMA, COLON 02/18/2007   Beaulah Dinning, PT,  DPT 10/10/18 1:30 PM  [facility] [address] [place], [name], [address] Phone: [phone]   Fax:  [phone]  Name: Lisabeth Mian MRN: [id number] Date of Birth: January 24, 1951

## 2018-10-11 NOTE — Telephone Encounter (Signed)
Pt LOV was 11/29/2017 and last refill was on 09/09/2018 for 30 tablets, please advise

## 2018-10-12 ENCOUNTER — Other Ambulatory Visit: Payer: Self-pay

## 2018-10-12 ENCOUNTER — Ambulatory Visit: Payer: Medicare Other | Attending: Neurosurgery | Admitting: Physical Therapy

## 2018-10-12 ENCOUNTER — Encounter: Payer: Self-pay | Admitting: Physical Therapy

## 2018-10-12 DIAGNOSIS — M6281 Muscle weakness (generalized): Secondary | ICD-10-CM

## 2018-10-12 DIAGNOSIS — R2689 Other abnormalities of gait and mobility: Secondary | ICD-10-CM | POA: Diagnosis present

## 2018-10-12 DIAGNOSIS — M25551 Pain in right hip: Secondary | ICD-10-CM | POA: Insufficient documentation

## 2018-10-12 DIAGNOSIS — M542 Cervicalgia: Secondary | ICD-10-CM | POA: Insufficient documentation

## 2018-10-12 NOTE — Therapy (Signed)
[place], [name], [address] Phone: [phone]   Fax:  [phone]  Physical Therapy Treatment  Patient Details  Name: [name] Bean MRN: [id number] Date of Birth: [birth year] Referring Provider (PT): Dr. Christella Noa    Encounter Date: 10/12/2018  PT End of Session - 10/12/18 1407    Visit Number  7    Number of Visits  12    Date for PT Re-Evaluation  10/21/18    PT Start Time  1320    PT Stop Time  1405   6 min spent dry needling not included in direct minutes   PT Time Calculation (min)  45 min    Activity Tolerance  Patient tolerated treatment well    Behavior During Therapy  [facility] for tasks assessed/performed       Past Medical History:  Diagnosis Date  . Anemia   . Anxiety   . Arthritis   . Asthma   . Broken arm    left arm  . Bronchitis   . Bursitis of left hip   . CAD (coronary artery disease)   . Candidiasis, vagina   . Cardiac arrest ([place]) 06/20/2016   at [facility]; ? due to flash pulm edema vs undertreated chronic CHF  . Cerumen impaction   . CHF (congestive heart failure) ([place])    [facility]ENT [facility] TO [facility]   . Colon, diverticulosis   . Depression   . Diabetes mellitus    15 YRS AGO  . Fatigue   . Gastroenteritis   . Hot flashes, menopausal   . Hyperlipidemia   . Knee pain, left   . Lumbar back pain   . Maxillary sinusitis    history  . Muscle tear    right gluteus  . Nausea   . OSA (obstructive sleep apnea) 10/16/2017  . Other B-complex deficiencies   . Otitis media, acute    left  . Peripheral neuropathy   . Spinal stenosis of lumbar region   . Tubulovillous adenoma of colon   . Unspecified essential hypertension     Past Surgical History:  Procedure Laterality Date  . CARDIAC CATHETERIZATION  2015, 2009, 2006  . CERVICAL SPINE SURGERY  2003  . CORONARY ANGIOPLASTY WITH STENT PLACEMENT  2002, 2015  . EYE SURGERY     cataracts bilaterally  . POSTERIOR CERVICAL  LAMINECTOMY N/A 10/23/2016   Procedure: POSTERIOR CERVICAL LAMINECTOMY MULTI LEVEL CERVICAL TWO- CERVICAL THREE, CERVICAL THREE- CERVICAL FOUR;  Surgeon: Ashok Pall, MD;  Location: [place];  Service: Neurosurgery;  Laterality: N/A;  POSTERIOR    There were no vitals filed for this visit.  Subjective Assessment - 10/12/18 1322    Subjective  Pt. reports last tx. session was helpful for neck with decreased pain and also benefit from warmer weather. Pt. requests ot focus on hip-discussed need for provider referral for this in order to focus on.    Currently in Pain?  Yes    Pain Score  5     Pain Location  Hip    Pain Orientation  Right;Lateral    Pain Descriptors / Indicators  Aching    Pain Type  Chronic pain    Pain Onset  More than a month ago    Pain Frequency  Constant    Aggravating Factors   sit>stand, walking    Pain Relieving Factors  rest, heat    Effect of Pain on Daily Activities  increased difficulty standing and toleratin ambulation for  community mobility.                       [facility] Adult PT Treatment/Exercise - 10/12/18 0001      Neck Exercises: Seated   Other Seated Exercise  scapular retractions x 15 reps      Neck Exercises: Supine   Neck Retraction  20 reps      Manual Therapy   Soft tissue mobilization  seated +supine STM and MFR to right>left upper trapezius and levator     Manual Traction  gentle manual traction and suboccipital release      Neck Exercises: Stretches   Upper Trapezius Stretch  Right;3 reps;30 seconds   supine manual stretch   Levator Stretch  Right;3 reps;30 seconds   supine manual stretch      Trigger Point Dry Needling - 10/12/18 0001    Consent Given?  Yes    Muscles Treated Head and Neck  Upper trapezius   also R gluteus medius          PT Education - 10/12/18 1407    Education Details  POC    Person(s) Educated  Patient    Methods  Explanation    Comprehension  Verbalized understanding           PT Long Term Goals - 10/03/18 1305      PT LONG TERM GOAL #1   Title  Pt will be able to walk in the community as needed (with cart) and decreased Rt LE pain for shopping.     Status  Achieved      PT LONG TERM GOAL #2   Title  Pt will be able to report no falls for >2 consecutive weeks and improved confidence     Status  Achieved      PT LONG TERM GOAL #3   Title  Pt will be able to be I with HEP for cervical ROM, UE and LE strength    Status  On-going      PT LONG TERM GOAL #4   Title  Pt will increase cervical AROM by 10 or more degrees in each direction, no lasting pain for improved driving, travel.     Baseline  see flowsheet, better in all planes 3- 10 deg    Status  Partially Met      PT LONG TERM GOAL #5   Title  Functional strength goals TUG to 14 sec or better with cane    Status  Unable to assess            Plan - 10/12/18 1408    Clinical Impression Statement  Improved response for decreased cervical pain and tension from previous status with associated functional gains for positional tolerance and ability for cervical rotation ROM. Mobility status impacted by R hip pain-pt. is to get referral for hip from referring provider to allow for tx. emphasis this area.    Comorbidities  DM, heart disease, cervical myelopathy     Stability/Clinical Decision Making  Evolving/Moderate complexity    Clinical Decision Making  Moderate    Rehab Potential  Good    PT Frequency  2x / week    PT Duration  6 weeks    PT Treatment/Interventions  ADLs/Self Care Home Management;DME Instruction;Balance training;Passive range of motion;Neuromuscular re-education;Gait training;Dry needling;Manual techniques;Taping;Functional mobility training;Moist Heat;Electrical Stimulation;Therapeutic exercise;Patient/family education;Therapeutic activities    PT Next Visit Plan  UBE vs Nustep,further HEP, manual/DN to cervicals    PT Home  Exercise Plan  chin tuck, bands in supine  (flexion/h abd), neck stretches, piriformis     Consulted and Agree with Plan of Care  Patient       Patient will benefit from skilled therapeutic intervention in order to improve the following deficits and impairments:  Abnormal gait, Increased fascial restricitons, Impaired sensation, Pain, Improper body mechanics, Postural dysfunction, Increased muscle spasms, Decreased coordination, Decreased mobility, Decreased activity tolerance, Decreased endurance, Decreased range of motion, Decreased strength, Impaired UE functional use, Obesity, Impaired flexibility, Increased edema, Difficulty walking, Decreased balance  Visit Diagnosis: 1. Other abnormalities of gait and mobility   2. Cervicalgia   3. Pain in right hip   4. Muscle weakness (generalized)        Problem List Patient Active Problem List   Diagnosis Date Noted  . OSA (obstructive sleep apnea) 10/16/2017  . Cervical stenosis of spinal canal 10/23/2016  . Neck swelling 09/28/2011  . Weight gain 01/02/2011  . Carpal tunnel syndrome 11/02/2010  . Leg swelling 08/26/2010  . ADRENAL MASS, LEFT 05/23/2010  . SPINAL STENOSIS, LUMBAR 12/27/2009  . B12 DEFICIENCY 06/10/2009  . ANEMIA 06/10/2009  . BACK PAIN, LUMBAR 11/06/2008  . KNEE PAIN, LEFT 02/03/2008  . DIABETES MELLITUS, TYPE II, UNCONTROLLED 01/12/2008  . MENOPAUSE-RELATED VASOMOTOR SYMPTOMS, HOT FLASHES 06/23/2007  . BURSITIS, LEFT HIP 06/23/2007  . HYPERLIPIDEMIA 03/25/2007  . ANXIETY 03/25/2007  . DEPRESSION 03/25/2007  . PERIPHERAL NEUROPATHY 03/25/2007  . HYPERTENSION 03/25/2007  . CORONARY ARTERY DISEASE 03/25/2007  . DIVERTICULOSIS, COLON 03/25/2007  . TUBULOVILLOUS ADENOMA, COLON 02/18/2007    Beaulah Dinning, PT, DPT 10/12/18 2:11 PM  [facility] [address] [place], [name], [address] Phone: [phone]   Fax:  [phone]  Name: [name] Bean MRN: [id number] Date of Birth:  [birth year]

## 2018-10-17 ENCOUNTER — Other Ambulatory Visit: Payer: Self-pay

## 2018-10-17 ENCOUNTER — Ambulatory Visit: Payer: Medicare Other | Admitting: Physical Therapy

## 2018-10-17 ENCOUNTER — Encounter: Payer: Self-pay | Admitting: Physical Therapy

## 2018-10-17 DIAGNOSIS — M6281 Muscle weakness (generalized): Secondary | ICD-10-CM

## 2018-10-17 DIAGNOSIS — R2689 Other abnormalities of gait and mobility: Secondary | ICD-10-CM

## 2018-10-17 DIAGNOSIS — M25551 Pain in right hip: Secondary | ICD-10-CM

## 2018-10-17 DIAGNOSIS — M542 Cervicalgia: Secondary | ICD-10-CM

## 2018-10-17 NOTE — Therapy (Signed)
[place], [name], [address] Phone: [phone]   Fax:  [phone]  Physical Therapy Treatment  Patient Details  Name: [name] Bean MRN: [id number] Date of Birth: June 01, 1950 Referring Provider (PT): Dr. Christella Noa    Encounter Date: 10/17/2018  PT End of Session - 10/17/18 1336    Visit Number  8    Number of Visits  12    Date for PT Re-Evaluation  10/21/18    PT Start Time  1242   late to arrive and delay at check in   PT Stop Time  1325   5 min dry needling not included in direct minutes   PT Time Calculation (min)  43 min    Activity Tolerance  Patient tolerated treatment well    Behavior During Therapy  [facility] for tasks assessed/performed       Past Medical History:  Diagnosis Date  . Anemia   . Anxiety   . Arthritis   . Asthma   . Broken arm    left arm  . Bronchitis   . Bursitis of left hip   . CAD (coronary artery disease)   . Candidiasis, vagina   . Cardiac arrest ([place]) 06/20/2016   at [facility]; ? due to flash pulm edema vs undertreated chronic CHF  . Cerumen impaction   . CHF (congestive heart failure) ([place])    [facility]ENT [facility] TO [facility]   . Colon, diverticulosis   . Depression   . Diabetes mellitus    15 YRS AGO  . Fatigue   . Gastroenteritis   . Hot flashes, menopausal   . Hyperlipidemia   . Knee pain, left   . Lumbar back pain   . Maxillary sinusitis    history  . Muscle tear    right gluteus  . Nausea   . OSA (obstructive sleep apnea) 10/16/2017  . Other B-complex deficiencies   . Otitis media, acute    left  . Peripheral neuropathy   . Spinal stenosis of lumbar region   . Tubulovillous adenoma of colon   . Unspecified essential hypertension     Past Surgical History:  Procedure Laterality Date  . CARDIAC CATHETERIZATION  2015, 2009, 2006  . CERVICAL SPINE SURGERY  2003  . CORONARY ANGIOPLASTY WITH STENT PLACEMENT  2002, 2015  . EYE SURGERY     cataracts  bilaterally  . POSTERIOR CERVICAL LAMINECTOMY N/A 10/23/2016   Procedure: POSTERIOR CERVICAL LAMINECTOMY MULTI LEVEL CERVICAL TWO- CERVICAL THREE, CERVICAL THREE- CERVICAL FOUR;  Surgeon: Ashok Pall, MD;  Location: [facility];  Service: Neurosurgery;  Laterality: N/A;  POSTERIOR    There were no vitals filed for this visit.  Subjective Assessment - 10/17/18 1331    Subjective  "It's too hot to hurt." Neck and hip not as painful today, still with ongoing stiffness in neck.    Currently in Pain?  Yes    Pain Score  3     Pain Location  Neck    Pain Orientation  Right    Pain Descriptors / Indicators  Aching    Pain Type  Chronic pain    Pain Radiating Towards  right upper trapezius    Pain Onset  More than a month ago    Pain Frequency  Constant    Aggravating Factors   worse in PM and with RUE lifting activities/use    Pain Relieving Factors  STM, heat    Effect of Pain on Daily Activities  impact on positional tolerance and ability reaching ADLs, ability to turn head for interaction with environment    Multiple Pain Sites  Yes    Pain Score  3    Pain Location  Hip    Pain Orientation  Right;Lateral    Pain Descriptors / Indicators  Aching    Pain Type  Chronic pain    Pain Radiating Towards  local to lateral hip region    Pain Onset  More than a month ago    Pain Frequency  Intermittent    Aggravating Factors   walking, standing    Pain Relieving Factors  rest, heat    Effect of Pain on Daily Activities  limits tolerance standing and ambulation         [facility] PT Assessment - 10/17/18 0001      AROM   Cervical Flexion  40    Cervical Extension  30    Cervical - Right Side Bend  22    Cervical - Left Side Bend  20    Cervical - Right Rotation  38    Cervical - Left Rotation  30                   [facility] Adult PT Treatment/Exercise - 10/17/18 0001      Neck Exercises: Theraband   Rows  20 reps;Red    Horizontal ABduction  20 reps    Horizontal ABduction  Limitations  yellow band in supine      Neck Exercises: Supine   Neck Retraction  20 reps      Manual Therapy   Soft tissue mobilization  seated +supine STM and MFR to right>left upper trapezius and levator     Manual Traction  gentle manual traction and suboccipital release      Neck Exercises: Stretches   Upper Trapezius Stretch  Right;3 reps;30 seconds   supine manual stretch   Levator Stretch  Right;3 reps;30 seconds   supine manual stretch      Trigger Point Dry Needling - 10/17/18 0001    Consent Given?  Yes    Muscles Treated Head and Neck  Upper trapezius   also R gluteus medius          PT Education - 10/17/18 1331    Education Details  POC    Person(s) Educated  Patient    Methods  Explanation    Comprehension  Verbalized understanding          PT Long Term Goals - 10/17/18 1350      PT LONG TERM GOAL #1   Title  Pt will be able to walk in the community as needed (with cart) and decreased Rt LE pain for shopping.     Baseline  met, still with pain but improved from previous status    Time  6    Period  Weeks    Status  Achieved      PT LONG TERM GOAL #2   Title  Pt will be able to report no falls for >2 consecutive weeks and improved confidence     Baseline  met    Time  6    Period  Weeks    Status  Achieved      PT LONG TERM GOAL #3   Title  Pt will be able to be I with HEP for cervical ROM, UE and LE strength    Baseline  updates ongoing    Time  6  Period  Weeks      PT LONG TERM GOAL #4   Title  Pt will increase cervical AROM by 10 or more degrees in each direction, no lasting pain for improved driving, travel.     Baseline  see flowsheet, partially met    Time  6    Period  Weeks    Status  Partially Met      PT LONG TERM GOAL #5   Title  Functional strength goals TUG to 14 sec or better with cane    Baseline  not tested today    Time  6    Period  Weeks    Status  On-going            Plan - 10/17/18 1337    Clinical  Impression Statement  Mild improvement with decreased pain from previous status and gains for cervical rotation AROM to right. Slower progress expected given symptom duration and surgical history. Pt. wishing to focus more on hip-she reports plan to obtain new PT referral before next session.    Comorbidities  DM, heart disease, cervical myelopathy     Stability/Clinical Decision Making  Evolving/Moderate complexity    Clinical Decision Making  Moderate    Rehab Potential  Good    PT Frequency  2x / week    PT Duration  6 weeks    PT Treatment/Interventions  ADLs/Self Care Home Management;DME Instruction;Balance training;Passive range of motion;Neuromuscular re-education;Gait training;Dry needling;Manual techniques;Taping;Functional mobility training;Moist Heat;Electrical Stimulation;Therapeutic exercise;Patient/family education;Therapeutic activities    PT Next Visit Plan  ERO vs. re-eval for hip if new Rx.UBE vs Nustep,further HEP, manual/DN to cervicals    PT Home Exercise Plan  chin tuck, bands in supine (flexion/h abd), neck stretches, piriformis     Consulted and Agree with Plan of Care  Patient       Patient will benefit from skilled therapeutic intervention in order to improve the following deficits and impairments:  Abnormal gait, Increased fascial restricitons, Impaired sensation, Pain, Improper body mechanics, Postural dysfunction, Increased muscle spasms, Decreased coordination, Decreased mobility, Decreased activity tolerance, Decreased endurance, Decreased range of motion, Decreased strength, Impaired UE functional use, Obesity, Impaired flexibility, Increased edema, Difficulty walking, Decreased balance  Visit Diagnosis: 1. Other abnormalities of gait and mobility   2. Cervicalgia   3. Pain in right hip   4. Muscle weakness (generalized)        Problem List Patient Active Problem List   Diagnosis Date Noted  . OSA (obstructive sleep apnea) 10/16/2017  . Cervical stenosis  of spinal canal 10/23/2016  . Neck swelling 09/28/2011  . Weight gain 01/02/2011  . Carpal tunnel syndrome 11/02/2010  . Leg swelling 08/26/2010  . ADRENAL MASS, LEFT 05/23/2010  . SPINAL STENOSIS, LUMBAR 12/27/2009  . B12 DEFICIENCY 06/10/2009  . ANEMIA 06/10/2009  . BACK PAIN, LUMBAR 11/06/2008  . KNEE PAIN, LEFT 02/03/2008  . DIABETES MELLITUS, TYPE II, UNCONTROLLED 01/12/2008  . MENOPAUSE-RELATED VASOMOTOR SYMPTOMS, HOT FLASHES 06/23/2007  . BURSITIS, LEFT HIP 06/23/2007  . HYPERLIPIDEMIA 03/25/2007  . ANXIETY 03/25/2007  . DEPRESSION 03/25/2007  . PERIPHERAL NEUROPATHY 03/25/2007  . HYPERTENSION 03/25/2007  . CORONARY ARTERY DISEASE 03/25/2007  . DIVERTICULOSIS, COLON 03/25/2007  . TUBULOVILLOUS ADENOMA, COLON 02/18/2007    Beaulah Dinning, PT, DPT 10/17/18 2:14 PM  [facility] [address] [place], [name], [address] Phone: [phone]   Fax:  [phone]  Name: [name] Bean MRN: [id number] Date of Birth: 06-19-1950

## 2018-10-18 ENCOUNTER — Telehealth: Payer: Self-pay | Admitting: Family Medicine

## 2018-10-18 NOTE — Telephone Encounter (Signed)
Medication Refill - Medication: Chantix starter pack. Medication for a yeast infection   Has the patient contacted their pharmacy? no (Agent: If no, request that the patient contact the pharmacy for the refill.) (Agent: If yes, when and what did the pharmacy advise?)  Preferred Pharmacy (with phone number or street name):  [id number] - [place], [place]. AT [address] [phone] (Phone) [phone] (Fax)   Agent: Please be advised that RX refills may take up to 3 business days. We ask that you follow-up with your pharmacy.

## 2018-10-19 ENCOUNTER — Encounter: Payer: Self-pay | Admitting: Physical Therapy

## 2018-10-19 ENCOUNTER — Other Ambulatory Visit: Payer: Self-pay

## 2018-10-19 ENCOUNTER — Ambulatory Visit: Payer: Medicare Other | Admitting: Physical Therapy

## 2018-10-19 VITALS — BP 186/83 | HR 110

## 2018-10-19 DIAGNOSIS — R2689 Other abnormalities of gait and mobility: Secondary | ICD-10-CM

## 2018-10-19 DIAGNOSIS — M542 Cervicalgia: Secondary | ICD-10-CM

## 2018-10-19 NOTE — Therapy (Signed)
[place], [name], [address] Phone: [phone]   Fax:  [phone]  Physical Therapy Treatment  Patient Details  Name: [name] Bean MRN: [id number] Date of Birth: 1950/10/15 Referring Provider (PT): Dr. Christella Noa    Encounter Date: 10/19/2018    Past Medical History:  Diagnosis Date  . Anemia   . Anxiety   . Arthritis   . Asthma   . Broken arm    left arm  . Bronchitis   . Bursitis of left hip   . CAD (coronary artery disease)   . Candidiasis, vagina   . Cardiac arrest ([place]) 06/20/2016   at [facility]; ? due to flash pulm edema vs undertreated chronic CHF  . Cerumen impaction   . CHF (congestive heart failure) ([place])    [facility]ENT [facility]   . Colon, diverticulosis   . Depression   . Diabetes mellitus    15 YRS AGO  . Fatigue   . Gastroenteritis   . Hot flashes, menopausal   . Hyperlipidemia   . Knee pain, left   . Lumbar back pain   . Maxillary sinusitis    history  . Muscle tear    right gluteus  . Nausea   . OSA (obstructive sleep apnea) 10/16/2017  . Other B-complex deficiencies   . Otitis media, acute    left  . Peripheral neuropathy   . Spinal stenosis of lumbar region   . Tubulovillous adenoma of colon   . Unspecified essential hypertension     Past Surgical History:  Procedure Laterality Date  . CARDIAC CATHETERIZATION  2015, 2009, 2006  . CERVICAL SPINE SURGERY  2003  . CORONARY ANGIOPLASTY WITH STENT PLACEMENT  2002, 2015  . EYE SURGERY     cataracts bilaterally  . POSTERIOR CERVICAL LAMINECTOMY N/A 10/23/2016   Procedure: POSTERIOR CERVICAL LAMINECTOMY MULTI LEVEL CERVICAL TWO- CERVICAL THREE, CERVICAL THREE- CERVICAL FOUR;  Surgeon: Ashok Pall, MD;  Location: [facility];  Service: Neurosurgery;  Laterality: N/A;  POSTERIOR    Vitals:   10/19/18 1245  BP: (!) 186/83  Pulse: (!) 110    Subjective Assessment - 10/19/18 1241    Subjective  Trouble breathing  today.  Sat prior to PT for decrease HR, rest. After rest, BP 170/80 and HR back down to <90      Patient was not feeling well today, hypertensive and slightly nauseous. She decided to leave without PT. I will call her MD today to see about adding her hip pain to her POC.      PT Long Term Goals - 10/17/18 1350      PT LONG TERM GOAL #1   Title  Pt will be able to walk in the community as needed (with cart) and decreased Rt LE pain for shopping.     Baseline  met, still with pain but improved from previous status    Time  6    Period  Weeks    Status  Achieved      PT LONG TERM GOAL #2   Title  Pt will be able to report no falls for >2 consecutive weeks and improved confidence     Baseline  met    Time  6    Period  Weeks    Status  Achieved      PT LONG TERM GOAL #3   Title  Pt will be able to be I with HEP for cervical ROM, UE and LE  strength    Baseline  updates ongoing    Time  6    Period  Weeks      PT LONG TERM GOAL #4   Title  Pt will increase cervical AROM by 10 or more degrees in each direction, no lasting pain for improved driving, travel.     Baseline  see flowsheet, partially met    Time  6    Period  Weeks    Status  Partially Met      PT LONG TERM GOAL #5   Title  Functional strength goals TUG to 14 sec or better with cane    Baseline  not tested today    Time  6    Period  Weeks    Status  On-going              Patient will benefit from skilled therapeutic intervention in order to improve the following deficits and impairments:     Visit Diagnosis: 1. Cervicalgia   2. Other abnormalities of gait and mobility        Problem List Patient Active Problem List   Diagnosis Date Noted  . OSA (obstructive sleep apnea) 10/16/2017  . Cervical stenosis of spinal canal 10/23/2016  . Neck swelling 09/28/2011  . Weight gain 01/02/2011  . Carpal tunnel syndrome 11/02/2010  . Leg swelling 08/26/2010  . ADRENAL MASS, LEFT 05/23/2010  . SPINAL  STENOSIS, LUMBAR 12/27/2009  . B12 DEFICIENCY 06/10/2009  . ANEMIA 06/10/2009  . BACK PAIN, LUMBAR 11/06/2008  . KNEE PAIN, LEFT 02/03/2008  . DIABETES MELLITUS, TYPE II, UNCONTROLLED 01/12/2008  . MENOPAUSE-RELATED VASOMOTOR SYMPTOMS, HOT FLASHES 06/23/2007  . BURSITIS, LEFT HIP 06/23/2007  . HYPERLIPIDEMIA 03/25/2007  . ANXIETY 03/25/2007  . DEPRESSION 03/25/2007  . PERIPHERAL NEUROPATHY 03/25/2007  . HYPERTENSION 03/25/2007  . CORONARY ARTERY DISEASE 03/25/2007  . DIVERTICULOSIS, COLON 03/25/2007  . TUBULOVILLOUS ADENOMA, COLON 02/18/2007    Cara Thaxton 10/19/2018, 1:02 PM  [facility] [address] [place], [name], [address] Phone: [phone]   Fax:  [phone]  Name: Eleyna Brugh MRN: [id number] Date of Birth: 04-Mar-1951  Raeford Razor, PT 10/19/18 1:03 PM Phone: [phone] Fax: [phone]

## 2018-10-20 ENCOUNTER — Other Ambulatory Visit: Payer: Self-pay

## 2018-10-20 MED ORDER — CHANTIX STARTING MONTH PAK 0.5 MG X 11 & 1 MG X 42 PO TABS: ORAL_TABLET | ORAL | 0 refills | Status: DC

## 2018-10-20 NOTE — Telephone Encounter (Signed)
Rx sent, pt states that she had stop taking Chantix for a while but now wants to start back taking.

## 2018-10-25 ENCOUNTER — Ambulatory Visit: Payer: Self-pay

## 2018-10-25 ENCOUNTER — Ambulatory Visit: Payer: Medicare Other | Admitting: Physical Therapy

## 2018-10-25 NOTE — Telephone Encounter (Signed)
Patient called and says she was at the dentist office today and her SBP was 178, then 205. She says she left without having anything done. She says she did not take her BP medication before going and they used an automatic cuff. She says she came home and took her medication. She says she doesn't have a way to check her BP at home and says she would like to come in tomorrow for a BP check. She says anytime the automatic cuff is used, her readings are high. She says she's not having any symptoms. I called the office and spoke to [place], [facility] who says to let the patient know someone will call back with Dr. Volanda Napoleon recommendation. I advised the patient of no availability with Dr. Volanda Napoleon tomorrow and the advice of Mechele Claude above, patient verbalized understanding.  Answer Assessment - Initial Assessment Questions 1. BLOOD PRESSURE: "What is the blood pressure?" "Did you take at least two measurements 5 minutes apart?"     SBP 178, then 205 at the dentist office 2. ONSET: "When did you take your blood pressure?"     Between 1100 and 1200 today 3. HOW: "How did you obtain the blood pressure?" (e.g., visiting nurse, automatic home BP monitor)     Automatic BP monitor at the dentist office 4. HISTORY: "Do you have a history of high blood pressure?"     Yes 5. MEDICATIONS: "Are you taking any medications for blood pressure?" "Have you missed any doses recently?"     Did not take the BP at the time of the appointment; haven't missed any doses 6. OTHER SYMPTOMS: "Do you have any symptoms?" (e.g., headache, chest pain, blurred vision, difficulty breathing, weakness)     No 7. PREGNANCY: "Is there any chance you are pregnant?" "When was your last menstrual period?"     No  Protocols used: HIGH BLOOD PRESSURE-A-AH

## 2018-10-25 NOTE — Telephone Encounter (Signed)
Pt should be advised to take her medication consistently.  Pt should also obtain a bp cuff for home use.  These can be purchased online, at your local pharmacy, or big box store.  [employer] makes a good cuff.  Pt should also be making diet changes.  If pt starts having HAs, CP, changes in vision, etc. would recommend eval at the nearest ED or UC.  Ok to schedule an in office appt for bp check.

## 2018-10-25 NOTE — Telephone Encounter (Signed)
Please advise 

## 2018-10-26 NOTE — Telephone Encounter (Signed)
Called pt left a detailed message with Dr Volanda Napoleon advise on pt BP, advised to call office and schedule a bp check appointment

## 2018-10-26 NOTE — Telephone Encounter (Signed)
Spoke with pt verbalized understanding of Dr Volanda Napoleon instruction/ recommendations regarding her BP, pt is scheduled for a BP check in office visit on 11/09/2018 at 4.30 pm

## 2018-10-31 ENCOUNTER — Other Ambulatory Visit: Payer: Self-pay

## 2018-10-31 ENCOUNTER — Encounter: Payer: Self-pay | Admitting: Physical Therapy

## 2018-10-31 ENCOUNTER — Ambulatory Visit: Payer: Medicare Other | Admitting: Physical Therapy

## 2018-10-31 DIAGNOSIS — R2689 Other abnormalities of gait and mobility: Secondary | ICD-10-CM

## 2018-10-31 DIAGNOSIS — M25551 Pain in right hip: Secondary | ICD-10-CM

## 2018-10-31 DIAGNOSIS — M542 Cervicalgia: Secondary | ICD-10-CM

## 2018-10-31 DIAGNOSIS — M6281 Muscle weakness (generalized): Secondary | ICD-10-CM

## 2018-10-31 NOTE — Therapy (Signed)
[place], [name], [address] Phone: [phone]   Fax:  [phone]  Physical Therapy Treatment Progress Note Reporting Period 09/09/2018 to 10/31/2018  See note below for Objective Data and Assessment of Progress/Goals.       Patient Details  Name: [name] Bean MRN: [id number] Date of Birth: Mar 11, 1951 Referring Provider (PT): Dr. Christella Noa    Encounter Date: 10/31/2018  PT End of Session - 10/31/18 1314    Visit Number  9    Number of Visits  21    Date for PT Re-Evaluation  12/12/18    Authorization Type  [employer] Medicare, next progress note by visit 61, add kx at visit 15    PT Start Time  1233    PT Stop Time  1318   time spent dry needling and with estim not included in direct timed minutes   PT Time Calculation (min)  45 min    Activity Tolerance  Patient tolerated treatment well    Behavior During Therapy  [facility] for tasks assessed/performed       Past Medical History:  Diagnosis Date  . Anemia   . Anxiety   . Arthritis   . Asthma   . Broken arm    left arm  . Bronchitis   . Bursitis of left hip   . CAD (coronary artery disease)   . Candidiasis, vagina   . Cardiac arrest ([place]) 06/20/2016   at [facility]; ? due to flash pulm edema vs undertreated chronic CHF  . Cerumen impaction   . CHF (congestive heart failure) ([place])    RECENT ADMIT TO [facility]   . Colon, diverticulosis   . Depression   . Diabetes mellitus    15 YRS AGO  . Fatigue   . Gastroenteritis   . Hot flashes, menopausal   . Hyperlipidemia   . Knee pain, left   . Lumbar back pain   . Maxillary sinusitis    history  . Muscle tear    right gluteus  . Nausea   . OSA (obstructive sleep apnea) 10/16/2017  . Other B-complex deficiencies   . Otitis media, acute    left  . Peripheral neuropathy   . Spinal stenosis of lumbar region   . Tubulovillous adenoma of colon   . Unspecified essential hypertension     Past  Surgical History:  Procedure Laterality Date  . CARDIAC CATHETERIZATION  2015, 2009, 2006  . CERVICAL SPINE SURGERY  2003  . CORONARY ANGIOPLASTY WITH STENT PLACEMENT  2002, 2015  . EYE SURGERY     cataracts bilaterally  . POSTERIOR CERVICAL LAMINECTOMY N/A 10/23/2016   Procedure: POSTERIOR CERVICAL LAMINECTOMY MULTI LEVEL CERVICAL TWO- CERVICAL THREE, CERVICAL THREE- CERVICAL FOUR;  Surgeon: Ashok Pall, MD;  Location: [place];  Service: Neurosurgery;  Laterality: N/A;  POSTERIOR    There were no vitals filed for this visit.  Subjective Assessment - 10/31/18 1320    Subjective  Pt. returns feeling better with breathing compared with status last attempted visit. She reports contacted her MD regarding BP issues and for now no change in medication (will follow up for appointment later this month). Pt. received updated PT prescription to include tx. for right hip which today is her main complaint. For neck still with stiffness but neck has been doing better in terms of pain.    Pertinent History  CAD, cervical fusion and calcification of ligament, ataxia, CHF, HTN, DM    Limitations  Lifting;Standing;Walking;House  hold activities    How long can you sit comfortably?  no limitation    How long can you stand comfortably?  5 minutes    How long can you walk comfortably?  5 minutes    Diagnostic tests  none recently    Patient Stated Goals  less pain, walk better     Currently in Pain?  Yes    Pain Score  4    Pain Location  Hip    Pain Orientation  Right;Lateral    Pain Descriptors / Indicators  Aching   "Bruising"   Pain Type  Chronic pain    Pain Onset  More than a month ago    Pain Frequency  Intermittent    Aggravating Factors   walking, standing    Pain Relieving Factors  rest, heat    Effect of Pain on Daily Activities  limits tolerance for standing and ambulation         [facility] PT Assessment - 10/31/18 0001      AROM   AROM Assessment Site  Hip    Right/Left Hip  Right;Left    bilat. hip AROM grossly [facility]   Cervical Flexion  35    Cervical Extension  25    Cervical - Right Side Bend  20    Cervical - Left Side Bend  20    Cervical - Right Rotation  38    Cervical - Left Rotation  25      Strength   Right Shoulder Flexion  4+/5    Right Shoulder ABduction  4+/5    Left Shoulder Flexion  4/5    Left Shoulder ABduction  4/5    Right/Left Hip  Right;Left    Right Hip Flexion  4+/5    Right Hip Extension  4/5    Right Hip External Rotation   4/5    Right Hip Internal Rotation  4+/5    Right Hip ABduction  4/5    Right Hip ADduction  4/5    Left Hip Flexion  4+/5    Left Hip External Rotation  4/5    Left Hip Internal Rotation  4+/5      Ambulation/Gait   Gait Comments  TUG x 18 seconds with NBQC                   [facility] Adult PT Treatment/Exercise - 10/31/18 0001      Exercises   Exercises  Knee/Hip      Knee/Hip Exercises: Stretches   ITB Stretch  Right;3 reps;30 seconds    ITB Stretch Limitations  supine manual stretch    Piriformis Stretch  Right;3 reps;30 seconds    Piriformis Stretch Limitations  supine manual stretch      Knee/Hip Exercises: Supine   Other Supine Knee/Hip Exercises  clamshells red band x 20 reps    Other Supine Knee/Hip Exercises  glut set 5 sec holds x 20 reps      Manual Therapy   Soft tissue mobilization  STM to right lateral hip region in left sidelying       Trigger Point Dry Needling - 10/31/18 0001    Consent Given?  Yes    Muscles Treated Back/Hip  Gluteus minimus;Gluteus medius;Piriformis    Dry Needling Comments  in left sidelying with 75 mm needles    Electrical Stimulation Performed with Dry Needling  Yes    E-stim with Dry Needling Details  TENS 2 pps x 10 min to  right lateral hip region           PT Education - 10/31/18 1508    Education Details  POC, potential symptom etiology for hip    Person(s) Educated  Patient    Methods  Explanation    Comprehension  Verbalized understanding           PT Long Term Goals - 10/31/18 1516      PT LONG TERM GOAL #1   Title  Pt will be able to walk in the community as needed (with cart) and decreased Rt LE pain for shopping.     Baseline  previously met but today reports limited tolerance (able 5 min comfortably) due to hip pain-continue goal until consistently met    Time  6    Period  Weeks    Status  On-going    Target Date  12/12/18      PT LONG TERM GOAL #2   Title  Pt will be able to report no falls for >2 consecutive weeks and improved confidence     Baseline  met    Time  6    Period  Weeks    Status  Achieved      PT LONG TERM GOAL #3   Title  Pt will be able to be I with HEP for cervical ROM, UE and LE strength    Baseline  updates ongoing    Time  6    Period  Weeks    Status  On-going    Target Date  12/12/18      PT LONG TERM GOAL #4   Title  Pt will increase cervical AROM by 10 or more degrees in each direction, no lasting pain for improved driving, travel.     Baseline  see flowsheet, partially met    Time  6    Period  Weeks    Status  Partially Met    Target Date  12/12/18      PT LONG TERM GOAL #5   Title  Functional strength goals TUG to 14 sec or better with cane    Baseline  18 sec with NBQC    Time  6    Period  Weeks    Status  On-going    Target Date  12/12/18            Plan - 10/31/18 1510    Clinical Impression Statement  For neck mild improvements with ROM from baseline most notable with right cervical rotation but continues with stiffness. Fair potential for progress with ROM given history and duration of symptoms with past fusion. For hip pt. presents with right hip muscular weakness and abductor/external rotator muscle tightness for which suspect myofascial etiology associated with altered gait mechanics. TUG score of 18 seconds indicative of increased fall risk. Pt. would benefit from continued PT for further progress re: therapy goals and to improve ability and safety  with standing and walking activities.    Personal Factors and Comorbidities  Behavior Pattern    Comorbidities  DM, heart disease, cervical myelopathy     Examination-Activity Limitations  Squat;Stairs;Lift;Bend;Locomotion Level;Stand;Transfers    Stability/Clinical Decision Making  Evolving/Moderate complexity    Clinical Decision Making  Moderate    Rehab Potential  Good    PT Frequency  2x / week    PT Duration  6 weeks    PT Treatment/Interventions  ADLs/Self Care Home Management;DME Instruction;Balance training;Passive range of motion;Neuromuscular re-education;Gait training;Dry needling;Manual techniques;Taping;Functional mobility  training;Moist Heat;Electrical Stimulation;Therapeutic exercise;Patient/family education;Therapeutic activities    PT Next Visit Plan  update HEP as needed for hip, NUSTEP, progress standing/CKC strengthening and functional + balance activities, hip strengthening and stretches, further dry needling as needed    PT Home Exercise Plan  chin tuck, bands in supine (flexion/h abd), neck stretches, piriformis     Consulted and Agree with Plan of Care  Patient       Patient will benefit from skilled therapeutic intervention in order to improve the following deficits and impairments:  Abnormal gait, Increased fascial restricitons, Impaired sensation, Pain, Improper body mechanics, Postural dysfunction, Increased muscle spasms, Decreased coordination, Decreased mobility, Decreased activity tolerance, Decreased endurance, Decreased range of motion, Decreased strength, Impaired UE functional use, Obesity, Impaired flexibility, Increased edema, Difficulty walking, Decreased balance  Visit Diagnosis: 1. Cervicalgia   2. Other abnormalities of gait and mobility   3. Pain in right hip   4. Muscle weakness (generalized)        Problem List Patient Active Problem List   Diagnosis Date Noted  . OSA (obstructive sleep apnea) 10/16/2017  . Cervical stenosis of spinal  canal 10/23/2016  . Neck swelling 09/28/2011  . Weight gain 01/02/2011  . Carpal tunnel syndrome 11/02/2010  . Leg swelling 08/26/2010  . ADRENAL MASS, LEFT 05/23/2010  . SPINAL STENOSIS, LUMBAR 12/27/2009  . B12 DEFICIENCY 06/10/2009  . ANEMIA 06/10/2009  . BACK PAIN, LUMBAR 11/06/2008  . KNEE PAIN, LEFT 02/03/2008  . DIABETES MELLITUS, TYPE II, UNCONTROLLED 01/12/2008  . MENOPAUSE-RELATED VASOMOTOR SYMPTOMS, HOT FLASHES 06/23/2007  . BURSITIS, LEFT HIP 06/23/2007  . HYPERLIPIDEMIA 03/25/2007  . ANXIETY 03/25/2007  . DEPRESSION 03/25/2007  . PERIPHERAL NEUROPATHY 03/25/2007  . HYPERTENSION 03/25/2007  . CORONARY ARTERY DISEASE 03/25/2007  . DIVERTICULOSIS, COLON 03/25/2007  . TUBULOVILLOUS ADENOMA, COLON 02/18/2007    Beaulah Dinning, PT, DPT 10/31/18 3:20 PM     [facility] [address] [place], [name], [address] Phone: [phone]   Fax:  [phone]  Name: [name] Bean MRN: [id number] Date of Birth: March 04, 1951

## 2018-11-02 ENCOUNTER — Other Ambulatory Visit: Payer: Self-pay

## 2018-11-02 ENCOUNTER — Ambulatory Visit: Payer: Medicare Other | Admitting: Physical Therapy

## 2018-11-02 DIAGNOSIS — R2689 Other abnormalities of gait and mobility: Secondary | ICD-10-CM

## 2018-11-02 DIAGNOSIS — M25551 Pain in right hip: Secondary | ICD-10-CM

## 2018-11-02 DIAGNOSIS — M6281 Muscle weakness (generalized): Secondary | ICD-10-CM

## 2018-11-02 DIAGNOSIS — M542 Cervicalgia: Secondary | ICD-10-CM

## 2018-11-02 NOTE — Therapy (Signed)
[place], [name], [address] Phone: [phone]   Fax:  [phone]  Physical Therapy Treatment  Patient Details  Name: [name] Bean MRN: [id number] Date of Birth: Jun 10, 1950 Referring Provider (PT): Dr. Christella Noa    Encounter Date: 11/02/2018  PT End of Session - 11/02/18 1415    Visit Number  10    Number of Visits  21    Date for PT Re-Evaluation  12/12/18    Authorization Type  [employer] Medicare, next progress note by visit 77, add kx at visit 15    PT Start Time  1405    PT Stop Time  1454    PT Time Calculation (min)  49 min    Activity Tolerance  Patient tolerated treatment well    Behavior During Therapy  [facility] for tasks assessed/performed       Past Medical History:  Diagnosis Date  . Anemia   . Anxiety   . Arthritis   . Asthma   . Broken arm    left arm  . Bronchitis   . Bursitis of left hip   . CAD (coronary artery disease)   . Candidiasis, vagina   . Cardiac arrest ([place]) 06/20/2016   at [facility]; ? due to flash pulm edema vs undertreated chronic CHF  . Cerumen impaction   . CHF (congestive heart failure) ([place])    RECENT ADMIT TO [facility]   . Colon, diverticulosis   . Depression   . Diabetes mellitus    15 YRS AGO  . Fatigue   . Gastroenteritis   . Hot flashes, menopausal   . Hyperlipidemia   . Knee pain, left   . Lumbar back pain   . Maxillary sinusitis    history  . Muscle tear    right gluteus  . Nausea   . OSA (obstructive sleep apnea) 10/16/2017  . Other B-complex deficiencies   . Otitis media, acute    left  . Peripheral neuropathy   . Spinal stenosis of lumbar region   . Tubulovillous adenoma of colon   . Unspecified essential hypertension     Past Surgical History:  Procedure Laterality Date  . CARDIAC CATHETERIZATION  2015, 2009, 2006  . CERVICAL SPINE SURGERY  2003  . CORONARY ANGIOPLASTY WITH STENT PLACEMENT  2002, 2015  . EYE SURGERY     cataracts  bilaterally  . POSTERIOR CERVICAL LAMINECTOMY N/A 10/23/2016   Procedure: POSTERIOR CERVICAL LAMINECTOMY MULTI LEVEL CERVICAL TWO- CERVICAL THREE, CERVICAL THREE- CERVICAL FOUR;  Surgeon: Ashok Pall, MD;  Location: [place];  Service: Neurosurgery;  Laterality: N/A;  POSTERIOR    There were no vitals filed for this visit.  Subjective Assessment - 11/02/18 1409    Subjective  Neck not hurting.  Hip a little.  Got that bruised feeling.  Not spasming as much as yesterday "everytime I stood up I have to wait".  Last time she had IFC with the Dry needling.    Currently in Pain?  Yes    Pain Score  3     Pain Location  Hip    Pain Orientation  Right;Lateral;Posterior    Pain Descriptors / Indicators  Aching;Tightness    Pain Type  Chronic pain    Pain Onset  More than a month ago    Aggravating Factors   standing, walking    Pain Relieving Factors  STM, heat         [facility] Adult PT Treatment/Exercise - 11/02/18  0001      Knee/Hip Exercises: Stretches   Active Hamstring Stretch  Right;3 reps;30 seconds    Piriformis Stretch  Right;3 reps;30 seconds    Other Knee/Hip Stretches  wide knees hips Er/IR with overpressure       Knee/Hip Exercises: Aerobic   Nustep  6 min UE and LE L6      Moist Heat Therapy   Number Minutes Moist Heat  15 Minutes    Moist Heat Location  Hip      Manual Therapy   Soft tissue mobilization  STM to Rt glute medius, piriformis, to right lateral hip region in left sidelying    Myofascial Release  Rt trunk     Passive ROM  hip ER/IR in prone                  PT Long Term Goals - 10/31/18 1516      PT LONG TERM GOAL #1   Title  Pt will be able to walk in the community as needed (with cart) and decreased Rt LE pain for shopping.     Baseline  previously met but today reports limited tolerance (able 5 min comfortably) due to hip pain-continue goal until consistently met    Time  6    Period  Weeks    Status  On-going    Target Date  12/12/18       PT LONG TERM GOAL #2   Title  Pt will be able to report no falls for >2 consecutive weeks and improved confidence     Baseline  met    Time  6    Period  Weeks    Status  Achieved      PT LONG TERM GOAL #3   Title  Pt will be able to be I with HEP for cervical ROM, UE and LE strength    Baseline  updates ongoing    Time  6    Period  Weeks    Status  On-going    Target Date  12/12/18      PT LONG TERM GOAL #4   Title  Pt will increase cervical AROM by 10 or more degrees in each direction, no lasting pain for improved driving, travel.     Baseline  see flowsheet, partially met    Time  6    Period  Weeks    Status  Partially Met    Target Date  12/12/18      PT LONG TERM GOAL #5   Title  Functional strength goals TUG to 14 sec or better with cane    Baseline  18 sec with NBQC    Time  6    Period  Weeks    Status  On-going    Target Date  12/12/18            Plan - 11/02/18 1446    Clinical Impression Statement  Addressed Rt hip tightness, pain and spasm with predominantly manual work today.  Pain along Rt lumbar paraspinals as well.  Encouraged consistent HEP and stretching.    PT Treatment/Interventions  ADLs/Self Care Home Management;DME Instruction;Balance training;Passive range of motion;Neuromuscular re-education;Gait training;Dry needling;Manual techniques;Taping;Functional mobility training;Moist Heat;Electrical Stimulation;Therapeutic exercise;Patient/family education;Therapeutic activities    PT Next Visit Plan  update HEP as needed for hip, NUSTEP, progress standing/CKC strengthening and functional + balance activities, hip strengthening and stretches, further dry needling as needed    PT Home Exercise Plan  chin tuck,  bands in supine (flexion/h abd), neck stretches, piriformis     Consulted and Agree with Plan of Care  Patient       Patient will benefit from skilled therapeutic intervention in order to improve the following deficits and impairments:   Abnormal gait, Increased fascial restricitons, Impaired sensation, Pain, Improper body mechanics, Postural dysfunction, Increased muscle spasms, Decreased coordination, Decreased mobility, Decreased activity tolerance, Decreased endurance, Decreased range of motion, Decreased strength, Impaired UE functional use, Obesity, Impaired flexibility, Increased edema, Difficulty walking, Decreased balance  Visit Diagnosis: 1. Cervicalgia   2. Other abnormalities of gait and mobility   3. Pain in right hip   4. Muscle weakness (generalized)        Problem List Patient Active Problem List   Diagnosis Date Noted  . OSA (obstructive sleep apnea) 10/16/2017  . Cervical stenosis of spinal canal 10/23/2016  . Neck swelling 09/28/2011  . Weight gain 01/02/2011  . Carpal tunnel syndrome 11/02/2010  . Leg swelling 08/26/2010  . ADRENAL MASS, LEFT 05/23/2010  . SPINAL STENOSIS, LUMBAR 12/27/2009  . B12 DEFICIENCY 06/10/2009  . ANEMIA 06/10/2009  . BACK PAIN, LUMBAR 11/06/2008  . KNEE PAIN, LEFT 02/03/2008  . DIABETES MELLITUS, TYPE II, UNCONTROLLED 01/12/2008  . MENOPAUSE-RELATED VASOMOTOR SYMPTOMS, HOT FLASHES 06/23/2007  . BURSITIS, LEFT HIP 06/23/2007  . HYPERLIPIDEMIA 03/25/2007  . ANXIETY 03/25/2007  . DEPRESSION 03/25/2007  . PERIPHERAL NEUROPATHY 03/25/2007  . HYPERTENSION 03/25/2007  . CORONARY ARTERY DISEASE 03/25/2007  . DIVERTICULOSIS, COLON 03/25/2007  . TUBULOVILLOUS ADENOMA, COLON 02/18/2007    Annjeanette Sarwar 11/02/2018, 4:16 PM  [facility] [address] [place], [name], [address] Phone: [phone]   Fax:  [phone]  Name: [name] Bean MRN: [id number] Date of Birth: [birth year]  Raeford Razor, PT 11/02/18 4:16 PM Phone: [phone] Fax: [phone]

## 2018-11-09 ENCOUNTER — Ambulatory Visit (INDEPENDENT_AMBULATORY_CARE_PROVIDER_SITE_OTHER): Payer: Medicare Other | Admitting: Family Medicine

## 2018-11-09 ENCOUNTER — Encounter: Payer: Self-pay | Admitting: Physical Therapy

## 2018-11-09 ENCOUNTER — Encounter: Payer: Self-pay | Admitting: Family Medicine

## 2018-11-09 ENCOUNTER — Ambulatory Visit: Payer: Medicare Other | Admitting: Physical Therapy

## 2018-11-09 ENCOUNTER — Other Ambulatory Visit: Payer: Self-pay

## 2018-11-09 VITALS — BP 110/60 | HR 99 | Temp 99.0°F

## 2018-11-09 DIAGNOSIS — M25551 Pain in right hip: Secondary | ICD-10-CM

## 2018-11-09 DIAGNOSIS — M6281 Muscle weakness (generalized): Secondary | ICD-10-CM

## 2018-11-09 DIAGNOSIS — M542 Cervicalgia: Secondary | ICD-10-CM

## 2018-11-09 DIAGNOSIS — I10 Essential (primary) hypertension: Secondary | ICD-10-CM | POA: Diagnosis not present

## 2018-11-09 DIAGNOSIS — R2689 Other abnormalities of gait and mobility: Secondary | ICD-10-CM

## 2018-11-09 NOTE — Therapy (Signed)
[place], [name], [address] Phone: [phone]   Fax:  [phone]  Physical Therapy Treatment  Patient Details  Name: [name] Bean MRN: [id number] Date of Birth: 1950-10-30 Referring Provider (PT): Dr. Christella Noa    Encounter Date: 11/09/2018  PT End of Session - 11/09/18 1352    Visit Number  11    Number of Visits  21    Date for PT Re-Evaluation  12/12/18    Authorization Type  [employer] Medicare, next progress note by visit 69, add kx at visit 15    PT Start Time  1316    PT Stop Time  1358   time spent dry needling not included in direct timed minutes   PT Time Calculation (min)  42 min    Activity Tolerance  Patient tolerated treatment well    Behavior During Therapy  [facility] for tasks assessed/performed       Past Medical History:  Diagnosis Date  . Anemia   . Anxiety   . Arthritis   . Asthma   . Broken arm    left arm  . Bronchitis   . Bursitis of left hip   . CAD (coronary artery disease)   . Candidiasis, vagina   . Cardiac arrest ([place]) 06/20/2016   at [facility]; ? due to flash pulm edema vs undertreated chronic CHF  . Cerumen impaction   . CHF (congestive heart failure) ([place])    RECENT ADMIT TO [facility]   . Colon, diverticulosis   . Depression   . Diabetes mellitus    15 YRS AGO  . Fatigue   . Gastroenteritis   . Hot flashes, menopausal   . Hyperlipidemia   . Knee pain, left   . Lumbar back pain   . Maxillary sinusitis    history  . Muscle tear    right gluteus  . Nausea   . OSA (obstructive sleep apnea) 10/16/2017  . Other B-complex deficiencies   . Otitis media, acute    left  . Peripheral neuropathy   . Spinal stenosis of lumbar region   . Tubulovillous adenoma of colon   . Unspecified essential hypertension     Past Surgical History:  Procedure Laterality Date  . CARDIAC CATHETERIZATION  2015, 2009, 2006  . CERVICAL SPINE SURGERY  2003  . CORONARY ANGIOPLASTY WITH  STENT PLACEMENT  2002, 2015  . EYE SURGERY     cataracts bilaterally  . POSTERIOR CERVICAL LAMINECTOMY N/A 10/23/2016   Procedure: POSTERIOR CERVICAL LAMINECTOMY MULTI LEVEL CERVICAL TWO- CERVICAL THREE, CERVICAL THREE- CERVICAL FOUR;  Surgeon: Ashok Pall, MD;  Location: [facility];  Service: Neurosurgery;  Laterality: N/A;  POSTERIOR    There were no vitals filed for this visit.  Subjective Assessment - 11/09/18 1349    Subjective  Pt. reports her neck is only hurting at night. Mild-moderate hip pain "a little more to the front" (pt. localizes pain to right TFL region). Pt. wants to try estim at least one more time to hip (had spasms after last time).    Pertinent History  CAD, cervical fusion and calcification of ligament, ataxia, CHF, HTN, DM    Limitations  Lifting;Standing;Walking;House hold activities    Currently in Pain?  Yes    Pain Score  2     Pain Location  Hip    Pain Orientation  Right;Lateral;Anterior    Pain Descriptors / Indicators  Aching;Tightness    Pain Type  Chronic pain  Pain Onset  More than a month ago    Pain Frequency  Constant    Aggravating Factors   standing and walking    Pain Relieving Factors  STM, heat    Effect of Pain on Daily Activities  Limits standing and walking tolerance for ADLs and community mobility.                       [facility] Adult PT Treatment/Exercise - 11/09/18 0001      Knee/Hip Exercises: Stretches   ITB Stretch  Right;3 reps;30 seconds    ITB Stretch Limitations  supine manual stretch    Piriformis Stretch  Right;3 reps;30 seconds      Knee/Hip Exercises: Standing   Hip Abduction  Stengthening;Right;Left;1 set;10 reps    Abduction Limitations  2 lb. ankle weight ea. LE bilat.    Forward Step Up  Right;15 reps;Hand Hold: 1      Knee/Hip Exercises: Seated   Sit to Sand  2 sets;5 reps      Manual Therapy   Manual Therapy  Joint mobilization    Joint Mobilization  Long axis distraction right hip oscillations  grade I-III    Soft tissue mobilization  STM to Rt glute medius, piriformis, to right lateral hip region in left sidelying       Trigger Point Dry Needling - 11/09/18 0001    Consent Given?  Yes    Muscles Treated Back/Hip  Gluteus minimus;Gluteus medius;Tensor fascia lata    Electrical Stimulation Performed with Dry Needling  Yes    E-stim with Dry Needling Details  TENS 20 pps x 10 min           PT Education - 11/09/18 1352    Education Details  POC    Person(s) Educated  Patient    Methods  Explanation    Comprehension  Verbalized understanding          PT Long Term Goals - 10/31/18 1516      PT LONG TERM GOAL #1   Title  Pt will be able to walk in the community as needed (with cart) and decreased Rt LE pain for shopping.     Baseline  previously met but today reports limited tolerance (able 5 min comfortably) due to hip pain-continue goal until consistently met    Time  6    Period  Weeks    Status  On-going    Target Date  12/12/18      PT LONG TERM GOAL #2   Title  Pt will be able to report no falls for >2 consecutive weeks and improved confidence     Baseline  met    Time  6    Period  Weeks    Status  Achieved      PT LONG TERM GOAL #3   Title  Pt will be able to be I with HEP for cervical ROM, UE and LE strength    Baseline  updates ongoing    Time  6    Period  Weeks    Status  On-going    Target Date  12/12/18      PT LONG TERM GOAL #4   Title  Pt will increase cervical AROM by 10 or more degrees in each direction, no lasting pain for improved driving, travel.     Baseline  see flowsheet, partially met    Time  6    Period  Weeks  Status  Partially Met    Target Date  12/12/18      PT LONG TERM GOAL #5   Title  Functional strength goals TUG to 14 sec or better with cane    Baseline  18 sec with NBQC    Time  6    Period  Weeks    Status  On-going    Target Date  12/12/18            Plan - 11/09/18 1352    Clinical Impression  Statement  Progressed standing strengthening for hip today with good tolerance though weakness noted in particular with standing + closed chain activities-suspect weakness in proximal hip region contributing to gait mechanics and ongoing hip pain. No spasm noted post-tx./after estom but will continue to monitor.    Comorbidities  DM, heart disease, cervical myelopathy     Examination-Activity Limitations  Squat;Stairs;Lift;Bend;Locomotion Level;Stand;Transfers    Examination-Participation Restrictions  Community Activity;Shop    Stability/Clinical Decision Making  Evolving/Moderate complexity    Clinical Decision Making  Moderate    Rehab Potential  Good    PT Frequency  2x / week    PT Duration  6 weeks    PT Treatment/Interventions  ADLs/Self Care Home Management;DME Instruction;Balance training;Passive range of motion;Neuromuscular re-education;Gait training;Dry needling;Manual techniques;Taping;Functional mobility training;Moist Heat;Electrical Stimulation;Therapeutic exercise;Patient/family education;Therapeutic activities    PT Next Visit Plan  check response estim-any spasms?, continue hip focus with strengthening, stretches, manual therapy, further dry needling prn    PT Home Exercise Plan  chin tuck, bands in supine (flexion/h abd), neck stretches, piriformis     Consulted and Agree with Plan of Care  Patient       Patient will benefit from skilled therapeutic intervention in order to improve the following deficits and impairments:  Abnormal gait, Increased fascial restricitons, Impaired sensation, Pain, Improper body mechanics, Postural dysfunction, Increased muscle spasms, Decreased coordination, Decreased mobility, Decreased activity tolerance, Decreased endurance, Decreased range of motion, Decreased strength, Impaired UE functional use, Obesity, Impaired flexibility, Increased edema, Difficulty walking, Decreased balance  Visit Diagnosis: 1. Cervicalgia   2. Other abnormalities of  gait and mobility   3. Pain in right hip   4. Muscle weakness (generalized)        Problem List Patient Active Problem List   Diagnosis Date Noted  . OSA (obstructive sleep apnea) 10/16/2017  . Cervical stenosis of spinal canal 10/23/2016  . Neck swelling 09/28/2011  . Weight gain 01/02/2011  . Carpal tunnel syndrome 11/02/2010  . Leg swelling 08/26/2010  . ADRENAL MASS, LEFT 05/23/2010  . SPINAL STENOSIS, LUMBAR 12/27/2009  . B12 DEFICIENCY 06/10/2009  . ANEMIA 06/10/2009  . BACK PAIN, LUMBAR 11/06/2008  . KNEE PAIN, LEFT 02/03/2008  . DIABETES MELLITUS, TYPE II, UNCONTROLLED 01/12/2008  . MENOPAUSE-RELATED VASOMOTOR SYMPTOMS, HOT FLASHES 06/23/2007  . BURSITIS, LEFT HIP 06/23/2007  . HYPERLIPIDEMIA 03/25/2007  . ANXIETY 03/25/2007  . DEPRESSION 03/25/2007  . PERIPHERAL NEUROPATHY 03/25/2007  . HYPERTENSION 03/25/2007  . CORONARY ARTERY DISEASE 03/25/2007  . DIVERTICULOSIS, COLON 03/25/2007  . TUBULOVILLOUS ADENOMA, COLON 02/18/2007    Beaulah Dinning, PT, DPT 11/09/18 1:56 PM  [facility] [address] [place], [name], [address] Phone: [phone]   Fax:  [phone]  Name: [name] Bean MRN: [id number] Date of Birth: 01-Feb-1951

## 2018-11-10 ENCOUNTER — Encounter: Payer: Self-pay | Admitting: Family Medicine

## 2018-11-10 NOTE — Progress Notes (Signed)
Subjective:    Patient ID: [name] Bean, female    DOB: August 01, 1950, 68 y.o.   MRN: [id number]  Chief Complaint  Patient presents with  . Hypertension    HPI Patient was seen today for f/u on HTN.  Pt states he bp was 200/? While at the dentist.  She did not take her meds that am.  Pt was unable to have her procedure 2/2 her bp.  Pt states bp has been normal otherwise.  Pt has a h/o anxiety.  She was prescribed Ativan 0.5 mg to take prior to dental procedures but forgot to take it.  Pt was asymptomatic.  Pt currently denies HAs, changes in vision, chest pain.  Past Medical History:  Diagnosis Date  . Anemia   . Anxiety   . Arthritis   . Asthma   . Broken arm    left arm  . Bronchitis   . Bursitis of left hip   . CAD (coronary artery disease)   . Candidiasis, vagina   . Cardiac arrest ([place]) 06/20/2016   at [facility]; ? due to flash pulm edema vs undertreated chronic CHF  . Cerumen impaction   . CHF (congestive heart failure) ([place])    RECENT ADMIT TO [facility]   . Colon, diverticulosis   . Depression   . Diabetes mellitus    15 YRS AGO  . Fatigue   . Gastroenteritis   . Hot flashes, menopausal   . Hyperlipidemia   . Knee pain, left   . Lumbar back pain   . Maxillary sinusitis    history  . Muscle tear    right gluteus  . Nausea   . OSA (obstructive sleep apnea) 10/16/2017  . Other B-complex deficiencies   . Otitis media, acute    left  . Peripheral neuropathy   . Spinal stenosis of lumbar region   . Tubulovillous adenoma of colon   . Unspecified essential hypertension     Allergies  Allergen Reactions  . Sitagliptin Other (See Comments)    Had pancreatitis while on Januvia     ROS General: Denies fever, chills, night sweats, changes in weight, changes in appetite HEENT: Denies headaches, ear pain, changes in vision, rhinorrhea, sore throat CV: Denies CP, palpitations, SOB, orthopnea Pulm: Denies SOB, cough, wheezing GI: Denies abdominal pain, nausea,  vomiting, diarrhea, constipation GU: Denies dysuria, hematuria, frequency, vaginal discharge Msk: Denies muscle cramps, joint pains Neuro: Denies weakness, numbness, tingling Skin: Denies rashes, bruising Psych: Denies depression, anxiety, hallucinations      Objective:    Blood pressure 110/60, pulse 99, temperature 99 F (37.2 C), temperature source Oral, SpO2 96 %.  Gen. Pleasant, well-nourished, in no distress, normal affect   HEENT: [place]/AT, face symmetric, no scleral icterus, PERRLA, nares patent without drainage Lungs: no accessory muscle use, CTAB, no wheezes or rales Cardiovascular: RRR, no m/r/g, no peripheral edema Neuro:  A&Ox3, CN II-XII intact, normal gait Skin:  Warm, no lesions/ rash  Wt Readings from Last 3 Encounters:  12/31/17 198 lb (89.8 kg)  11/01/17 195 lb (88.5 kg)  10/13/17 191 lb (86.6 kg)    Lab Results  Component Value Date   WBC 6.2 10/19/2016   HGB 11.8 (L) 10/19/2016   HCT 35.8 (L) 10/19/2016   PLT 218 10/19/2016   GLUCOSE 257 (H) 06/24/2017   CHOL 190 01/02/2011   TRIG 167.0 (H) 01/02/2011   HDL 52.30 01/02/2011   LDLCALC 104 (H) 01/02/2011   ALT 11 (L) 09/09/2016  AST 19 09/09/2016   NA 137 06/24/2017   K 3.6 06/24/2017   CL 101 06/24/2017   CREATININE 0.69 06/24/2017   BUN 7 06/24/2017   CO2 24 06/24/2017   TSH 0.981 10/28/2010   INR 1.0 RATIO 10/27/2007   HGBA1C 10.6 (A) 06/07/2018   MICROALBUR 0.67 01/02/2011    Assessment/Plan:  Essential hypertension  -controlled in office -continue lifestyle modifications -pt advised to take Ativan 30 min prior to dental procedure -continue checking bp at home and keeping a log to bring to next appt.  Pt to notify clinic if bp consistently  >140/90 prior to next appt.  F/u in 1-2 months for HTN  Grier Mitts, MD

## 2018-11-11 ENCOUNTER — Other Ambulatory Visit: Payer: Self-pay

## 2018-11-11 ENCOUNTER — Encounter: Payer: Self-pay | Admitting: Physical Therapy

## 2018-11-11 ENCOUNTER — Ambulatory Visit: Payer: Medicare Other | Admitting: Physical Therapy

## 2018-11-11 DIAGNOSIS — M25551 Pain in right hip: Secondary | ICD-10-CM

## 2018-11-11 DIAGNOSIS — M6281 Muscle weakness (generalized): Secondary | ICD-10-CM

## 2018-11-11 DIAGNOSIS — R2689 Other abnormalities of gait and mobility: Secondary | ICD-10-CM | POA: Diagnosis not present

## 2018-11-11 DIAGNOSIS — M542 Cervicalgia: Secondary | ICD-10-CM

## 2018-11-11 NOTE — Therapy (Signed)
[place], [name], [address] Phone: [phone]   Fax:  [phone]  Physical Therapy Treatment  Patient Details  Name: [name] Bean MRN: [id number] Date of Birth: 14-Oct-1950 Referring Provider (PT): Dr. Christella Noa    Encounter Date: 11/11/2018  PT End of Session - 11/11/18 1102    Visit Number  12    Number of Visits  21    Date for PT Re-Evaluation  12/12/18    Authorization Type  [employer] Medicare, next progress note by visit 35, add kx at visit 15    PT Start Time  1017    PT Stop Time  1108   time spent for dry needling and estim not included in timed minutes   PT Time Calculation (min)  51 min    Activity Tolerance  Patient tolerated treatment well    Behavior During Therapy  [facility] for tasks assessed/performed       Past Medical History:  Diagnosis Date  . Anemia   . Anxiety   . Arthritis   . Asthma   . Broken arm    left arm  . Bronchitis   . Bursitis of left hip   . CAD (coronary artery disease)   . Candidiasis, vagina   . Cardiac arrest ([place]) 06/20/2016   at [facility]; ? due to flash pulm edema vs undertreated chronic CHF  . Cerumen impaction   . CHF (congestive heart failure) ([place])    RECENT ADMIT TO [facility]   . Colon, diverticulosis   . Depression   . Diabetes mellitus    15 YRS AGO  . Fatigue   . Gastroenteritis   . Hot flashes, menopausal   . Hyperlipidemia   . Knee pain, left   . Lumbar back pain   . Maxillary sinusitis    history  . Muscle tear    right gluteus  . Nausea   . OSA (obstructive sleep apnea) 10/16/2017  . Other B-complex deficiencies   . Otitis media, acute    left  . Peripheral neuropathy   . Spinal stenosis of lumbar region   . Tubulovillous adenoma of colon   . Unspecified essential hypertension     Past Surgical History:  Procedure Laterality Date  . CARDIAC CATHETERIZATION  2015, 2009, 2006  . CERVICAL SPINE SURGERY  2003  . CORONARY ANGIOPLASTY  WITH STENT PLACEMENT  2002, 2015  . EYE SURGERY     cataracts bilaterally  . POSTERIOR CERVICAL LAMINECTOMY N/A 10/23/2016   Procedure: POSTERIOR CERVICAL LAMINECTOMY MULTI LEVEL CERVICAL TWO- CERVICAL THREE, CERVICAL THREE- CERVICAL FOUR;  Surgeon: Ashok Pall, MD;  Location: [facility];  Service: Neurosurgery;  Laterality: N/A;  POSTERIOR    There were no vitals filed for this visit.  Subjective Assessment - 11/11/18 1018    Subjective  Pt. reports woke up last night twice due to both neck and hip pain. No spasms noted after last tx. in hip.    Pertinent History  CAD, cervical fusion and calcification of ligament, ataxia, CHF, HTN, DM                       [facility] Adult PT Treatment/Exercise - 11/11/18 0001      Knee/Hip Exercises: Stretches   ITB Stretch  Right;3 reps;30 seconds    ITB Stretch Limitations  supine manual stretch    Piriformis Stretch  Right;3 reps;30 seconds      Knee/Hip Exercises: Standing   Hip  Abduction  Stengthening;Both;2 sets;10 reps    Abduction Limitations  3 lbs.    Forward Step Up  Right;15 reps;Hand Hold: 1;Step Height: 4"      Manual Therapy   Soft tissue mobilization  STM right upper trapezius/levator region in sitting and right lateral hip    Manual Traction  gentle cervical manual traction and suboccipital release      Neck Exercises: Stretches   Upper Trapezius Stretch  Right;3 reps;30 seconds   supine manual stretch      Trigger Point Dry Needling - 11/11/18 0001    Consent Given?  Yes    Muscles Treated Head and Neck  Upper trapezius    Muscles Treated Back/Hip  Gluteus minimus;Gluteus medius;Tensor fascia lata    Electrical Stimulation Performed with Dry Needling  Yes    E-stim with Dry Needling Details  TENS 20 pps x 10 minutes                PT Long Term Goals - 10/31/18 1516      PT LONG TERM GOAL #1   Title  Pt will be able to walk in the community as needed (with cart) and decreased Rt LE pain for shopping.      Baseline  previously met but today reports limited tolerance (able 5 min comfortably) due to hip pain-continue goal until consistently met    Time  6    Period  Weeks    Status  On-going    Target Date  12/12/18      PT LONG TERM GOAL #2   Title  Pt will be able to report no falls for >2 consecutive weeks and improved confidence     Baseline  met    Time  6    Period  Weeks    Status  Achieved      PT LONG TERM GOAL #3   Title  Pt will be able to be I with HEP for cervical ROM, UE and LE strength    Baseline  updates ongoing    Time  6    Period  Weeks    Status  On-going    Target Date  12/12/18      PT LONG TERM GOAL #4   Title  Pt will increase cervical AROM by 10 or more degrees in each direction, no lasting pain for improved driving, travel.     Baseline  see flowsheet, partially met    Time  6    Period  Weeks    Status  Partially Met    Target Date  12/12/18      PT LONG TERM GOAL #5   Title  Functional strength goals TUG to 14 sec or better with cane    Baseline  18 sec with NBQC    Time  6    Period  Weeks    Status  On-going    Target Date  12/12/18            Plan - 11/11/18 1103    Clinical Impression Statement  Mild setback with exacerbation neck pain-continues with cervical pain and tightness, fair progress with hip addressed with continued strengthening and stretches as well as continued dry needling.    Comorbidities  DM, heart disease, cervical myelopathy     Examination-Activity Limitations  Squat;Stairs;Lift;Bend;Locomotion Level;Stand;Transfers    Examination-Participation Restrictions  Community Activity;Shop    Stability/Clinical Decision Making  Evolving/Moderate complexity    Clinical Decision Making  Moderate  Rehab Potential  Good    PT Frequency  2x / week    PT Duration  8 weeks    PT Treatment/Interventions  ADLs/Self Care Home Management;DME Instruction;Balance training;Passive range of motion;Neuromuscular re-education;Gait  training;Dry needling;Manual techniques;Taping;Functional mobility training;Moist Heat;Electrical Stimulation;Therapeutic exercise;Patient/family education;Therapeutic activities    PT Next Visit Plan  continue include neck tx. prn but otherwise continue hip focus with strengthening, stretches, manual therapy, further dry needling prn    PT Home Exercise Plan  chin tuck, bands in supine (flexion/h abd), neck stretches, piriformis     Consulted and Agree with Plan of Care  Patient       Patient will benefit from skilled therapeutic intervention in order to improve the following deficits and impairments:  Abnormal gait, Increased fascial restricitons, Impaired sensation, Pain, Improper body mechanics, Postural dysfunction, Increased muscle spasms, Decreased coordination, Decreased mobility, Decreased activity tolerance, Decreased endurance, Decreased range of motion, Decreased strength, Impaired UE functional use, Obesity, Impaired flexibility, Increased edema, Difficulty walking, Decreased balance  Visit Diagnosis: 1. Cervicalgia   2. Other abnormalities of gait and mobility   3. Pain in right hip   4. Muscle weakness (generalized)        Problem List Patient Active Problem List   Diagnosis Date Noted  . OSA (obstructive sleep apnea) 10/16/2017  . Cervical stenosis of spinal canal 10/23/2016  . Neck swelling 09/28/2011  . Weight gain 01/02/2011  . Carpal tunnel syndrome 11/02/2010  . Leg swelling 08/26/2010  . ADRENAL MASS, LEFT 05/23/2010  . SPINAL STENOSIS, LUMBAR 12/27/2009  . B12 DEFICIENCY 06/10/2009  . ANEMIA 06/10/2009  . BACK PAIN, LUMBAR 11/06/2008  . KNEE PAIN, LEFT 02/03/2008  . DIABETES MELLITUS, TYPE II, UNCONTROLLED 01/12/2008  . MENOPAUSE-RELATED VASOMOTOR SYMPTOMS, HOT FLASHES 06/23/2007  . BURSITIS, LEFT HIP 06/23/2007  . HYPERLIPIDEMIA 03/25/2007  . ANXIETY 03/25/2007  . DEPRESSION 03/25/2007  . PERIPHERAL NEUROPATHY 03/25/2007  . HYPERTENSION 03/25/2007  .  CORONARY ARTERY DISEASE 03/25/2007  . DIVERTICULOSIS, COLON 03/25/2007  . TUBULOVILLOUS ADENOMA, COLON 02/18/2007    Beaulah Dinning, PT, DPT 11/11/18 11:05 AM  [facility] [address] [place], [name], [address] Phone: [phone]   Fax:  [phone]  Name: [name] Bean MRN: [id number] Date of Birth: 1950/11/27

## 2018-11-14 ENCOUNTER — Encounter: Payer: Self-pay | Admitting: Physical Therapy

## 2018-11-14 ENCOUNTER — Other Ambulatory Visit: Payer: Self-pay

## 2018-11-14 ENCOUNTER — Ambulatory Visit: Payer: Medicare Other | Attending: Neurosurgery | Admitting: Physical Therapy

## 2018-11-14 DIAGNOSIS — M25551 Pain in right hip: Secondary | ICD-10-CM

## 2018-11-14 DIAGNOSIS — M542 Cervicalgia: Secondary | ICD-10-CM

## 2018-11-14 DIAGNOSIS — R2689 Other abnormalities of gait and mobility: Secondary | ICD-10-CM | POA: Diagnosis present

## 2018-11-14 DIAGNOSIS — M6281 Muscle weakness (generalized): Secondary | ICD-10-CM | POA: Diagnosis present

## 2018-11-14 NOTE — Therapy (Signed)
[place], [name], [address] Phone: [phone]   Fax:  [phone]  Physical Therapy Treatment  Patient Details  Name: [name] Bean MRN: [id number] Date of Birth: Feb 25, 1951 Referring Provider (PT): Dr. Christella Noa    Encounter Date: 11/14/2018  PT End of Session - 11/14/18 1506    Visit Number  13    Number of Visits  21    Date for PT Re-Evaluation  12/12/18    Authorization Type  [employer] Medicare, next progress note by visit 29, add kx at visit 15    PT Start Time  1344   pt. arrived late   PT Stop Time  1415   4 min spent dry needling not included in direct tx. minutes   PT Time Calculation (min)  31 min    Activity Tolerance  Patient tolerated treatment well    Behavior During Therapy  [facility] for tasks assessed/performed       Past Medical History:  Diagnosis Date  . Anemia   . Anxiety   . Arthritis   . Asthma   . Broken arm    left arm  . Bronchitis   . Bursitis of left hip   . CAD (coronary artery disease)   . Candidiasis, vagina   . Cardiac arrest ([place]) 06/20/2016   at [facility]; ? due to flash pulm edema vs undertreated chronic CHF  . Cerumen impaction   . CHF (congestive heart failure) ([place])    RECENT ADMIT TO [facility]   . Colon, diverticulosis   . Depression   . Diabetes mellitus    15 YRS AGO  . Fatigue   . Gastroenteritis   . Hot flashes, menopausal   . Hyperlipidemia   . Knee pain, left   . Lumbar back pain   . Maxillary sinusitis    history  . Muscle tear    right gluteus  . Nausea   . OSA (obstructive sleep apnea) 10/16/2017  . Other B-complex deficiencies   . Otitis media, acute    left  . Peripheral neuropathy   . Spinal stenosis of lumbar region   . Tubulovillous adenoma of colon   . Unspecified essential hypertension     Past Surgical History:  Procedure Laterality Date  . CARDIAC CATHETERIZATION  2015, 2009, 2006  . CERVICAL SPINE SURGERY  2003  . CORONARY  ANGIOPLASTY WITH STENT PLACEMENT  2002, 2015  . EYE SURGERY     cataracts bilaterally  . POSTERIOR CERVICAL LAMINECTOMY N/A 10/23/2016   Procedure: POSTERIOR CERVICAL LAMINECTOMY MULTI LEVEL CERVICAL TWO- CERVICAL THREE, CERVICAL THREE- CERVICAL FOUR;  Surgeon: Ashok Pall, MD;  Location: [place];  Service: Neurosurgery;  Laterality: N/A;  POSTERIOR    There were no vitals filed for this visit.  Subjective Assessment - 11/14/18 1503    Subjective  Pt. 14 minutes late to arrive for treatment so session abbreviated. She reports did well after last treatment, no pain pre-tx. She requests fo focus on neck.    Pertinent History  CAD, cervical fusion and calcification of ligament, ataxia, CHF, HTN, DM    Limitations  Lifting;Standing;Walking;House hold activities    Currently in Pain?  No/denies                       [facility] Adult PT Treatment/Exercise - 11/14/18 0001      Neck Exercises: Supine   Neck Retraction  15 reps    Neck Retraction Limitations  supine with gentle assistance     Other Supine Exercise  supine retraction to assisted flexion x 15 reps      Manual Therapy   Soft tissue mobilization  STM right upper trapezius and levator region    Manual Traction  gentle cervical manual traction and suboccipital release      Neck Exercises: Stretches   Upper Trapezius Stretch  Right;3 reps;30 seconds   supine manual stretch   Levator Stretch  Right;3 reps;30 seconds   supine manual stretch      Trigger Point Dry Needling - 11/14/18 0001    Consent Given?  Yes    Muscles Treated Head and Neck  Upper trapezius    Dry Needling Comments  Right upper trapezius needled briefly in sitting with 04 mm needle           PT Education - 11/14/18 1506    Education Details  POC, gait-pt. was walking with "prongs" to NBQC pointing in so advised turn cane around to avoid tripping hazard    Person(s) Educated  Patient    Methods  Explanation    Comprehension  Verbalized  understanding          PT Long Term Goals - 10/31/18 1516      PT LONG TERM GOAL #1   Title  Pt will be able to walk in the community as needed (with cart) and decreased Rt LE pain for shopping.     Baseline  previously met but today reports limited tolerance (able 5 min comfortably) due to hip pain-continue goal until consistently met    Time  6    Period  Weeks    Status  On-going    Target Date  12/12/18      PT LONG TERM GOAL #2   Title  Pt will be able to report no falls for >2 consecutive weeks and improved confidence     Baseline  met    Time  6    Period  Weeks    Status  Achieved      PT LONG TERM GOAL #3   Title  Pt will be able to be I with HEP for cervical ROM, UE and LE strength    Baseline  updates ongoing    Time  6    Period  Weeks    Status  On-going    Target Date  12/12/18      PT LONG TERM GOAL #4   Title  Pt will increase cervical AROM by 10 or more degrees in each direction, no lasting pain for improved driving, travel.     Baseline  see flowsheet, partially met    Time  6    Period  Weeks    Status  Partially Met    Target Date  12/12/18      PT LONG TERM GOAL #5   Title  Functional strength goals TUG to 14 sec or better with cane    Baseline  18 sec with NBQC    Time  6    Period  Weeks    Status  On-going    Target Date  12/12/18            Plan - 11/14/18 1509    Clinical Impression Statement  Still with ongoing cervical tightness/limited ROM with underlying fusion but improving with positional tolerance for decreased neck pain as well as toleration for standing/ambulation with decreased hip pain.    Personal Factors and Comorbidities  Behavior Pattern    Comorbidities  DM, heart disease, cervical myelopathy     Examination-Activity Limitations  Squat;Stairs;Lift;Bend;Locomotion Level;Stand;Transfers    Stability/Clinical Decision Making  Evolving/Moderate complexity    Clinical Decision Making  Moderate    Rehab Potential  Good     PT Frequency  2x / week    PT Duration  8 weeks    PT Treatment/Interventions  ADLs/Self Care Home Management;DME Instruction;Balance training;Passive range of motion;Neuromuscular re-education;Gait training;Dry needling;Manual techniques;Taping;Functional mobility training;Moist Heat;Electrical Stimulation;Therapeutic exercise;Patient/family education;Therapeutic activities    PT Next Visit Plan  continue include neck tx. prn but otherwise continue hip focus with strengthening, stretches, manual therapy, further dry needling prn    PT Home Exercise Plan  chin tuck, bands in supine (flexion/h abd), neck stretches, piriformis     Consulted and Agree with Plan of Care  Patient       Patient will benefit from skilled therapeutic intervention in order to improve the following deficits and impairments:  Abnormal gait, Increased fascial restricitons, Impaired sensation, Pain, Improper body mechanics, Postural dysfunction, Increased muscle spasms, Decreased coordination, Decreased mobility, Decreased activity tolerance, Decreased endurance, Decreased range of motion, Decreased strength, Impaired UE functional use, Obesity, Impaired flexibility, Increased edema, Difficulty walking, Decreased balance  Visit Diagnosis: 1. Cervicalgia   2. Other abnormalities of gait and mobility   3. Pain in right hip   4. Muscle weakness (generalized)        Problem List Patient Active Problem List   Diagnosis Date Noted  . OSA (obstructive sleep apnea) 10/16/2017  . Cervical stenosis of spinal canal 10/23/2016  . Neck swelling 09/28/2011  . Weight gain 01/02/2011  . Carpal tunnel syndrome 11/02/2010  . Leg swelling 08/26/2010  . ADRENAL MASS, LEFT 05/23/2010  . SPINAL STENOSIS, LUMBAR 12/27/2009  . B12 DEFICIENCY 06/10/2009  . ANEMIA 06/10/2009  . BACK PAIN, LUMBAR 11/06/2008  . KNEE PAIN, LEFT 02/03/2008  . DIABETES MELLITUS, TYPE II, UNCONTROLLED 01/12/2008  . MENOPAUSE-RELATED VASOMOTOR SYMPTOMS,  HOT FLASHES 06/23/2007  . BURSITIS, LEFT HIP 06/23/2007  . HYPERLIPIDEMIA 03/25/2007  . ANXIETY 03/25/2007  . DEPRESSION 03/25/2007  . PERIPHERAL NEUROPATHY 03/25/2007  . HYPERTENSION 03/25/2007  . CORONARY ARTERY DISEASE 03/25/2007  . DIVERTICULOSIS, COLON 03/25/2007  . TUBULOVILLOUS ADENOMA, COLON 02/18/2007   Beaulah Dinning, PT, DPT 11/14/18 3:12 PM  [facility] [address] [place], [name], [address] Phone: [phone]   Fax:  [phone]  Name: [name] Bean MRN: [id number] Date of Birth: [birth year]

## 2018-11-16 ENCOUNTER — Ambulatory Visit: Payer: Medicare Other | Admitting: Physical Therapy

## 2018-11-21 ENCOUNTER — Other Ambulatory Visit: Payer: Self-pay

## 2018-11-21 ENCOUNTER — Encounter: Payer: Self-pay | Admitting: Physical Therapy

## 2018-11-21 ENCOUNTER — Ambulatory Visit: Payer: Medicare Other | Admitting: Physical Therapy

## 2018-11-21 DIAGNOSIS — M25551 Pain in right hip: Secondary | ICD-10-CM

## 2018-11-21 DIAGNOSIS — M542 Cervicalgia: Secondary | ICD-10-CM | POA: Diagnosis not present

## 2018-11-21 DIAGNOSIS — R2689 Other abnormalities of gait and mobility: Secondary | ICD-10-CM

## 2018-11-21 DIAGNOSIS — M6281 Muscle weakness (generalized): Secondary | ICD-10-CM

## 2018-11-21 NOTE — Therapy (Signed)
[place], [name], [address] Phone: [phone]   Fax:  [phone]  Physical Therapy Treatment  Patient Details  Name: [name] Bean MRN: [id number] Date of Birth: 10-09-1950 Referring Provider (PT): Dr. Christella Noa    Encounter Date: 11/21/2018  PT End of Session - 11/21/18 1405    Visit Number  14    Number of Visits  21    Date for PT Re-Evaluation  12/12/18    Authorization Type  [employer] Medicare, next progress note by visit 15, add kx at visit 15    PT Start Time  1332    PT Stop Time  1412   time spent for dry needling and estim not included in direct treatment minutes   PT Time Calculation (min)  40 min    Activity Tolerance  Patient tolerated treatment well    Behavior During Therapy  [facility] for tasks assessed/performed       Past Medical History:  Diagnosis Date  . Anemia   . Anxiety   . Arthritis   . Asthma   . Broken arm    left arm  . Bronchitis   . Bursitis of left hip   . CAD (coronary artery disease)   . Candidiasis, vagina   . Cardiac arrest ([place]) 06/20/2016   at [facility]; ? due to flash pulm edema vs undertreated chronic CHF  . Cerumen impaction   . CHF (congestive heart failure) ([place])    RECENT ADMIT TO [facility]   . Colon, diverticulosis   . Depression   . Diabetes mellitus    15 YRS AGO  . Fatigue   . Gastroenteritis   . Hot flashes, menopausal   . Hyperlipidemia   . Knee pain, left   . Lumbar back pain   . Maxillary sinusitis    history  . Muscle tear    right gluteus  . Nausea   . OSA (obstructive sleep apnea) 10/16/2017  . Other B-complex deficiencies   . Otitis media, acute    left  . Peripheral neuropathy   . Spinal stenosis of lumbar region   . Tubulovillous adenoma of colon   . Unspecified essential hypertension     Past Surgical History:  Procedure Laterality Date  . CARDIAC CATHETERIZATION  2015, 2009, 2006  . CERVICAL SPINE SURGERY  2003  . CORONARY  ANGIOPLASTY WITH STENT PLACEMENT  2002, 2015  . EYE SURGERY     cataracts bilaterally  . POSTERIOR CERVICAL LAMINECTOMY N/A 10/23/2016   Procedure: POSTERIOR CERVICAL LAMINECTOMY MULTI LEVEL CERVICAL TWO- CERVICAL THREE, CERVICAL THREE- CERVICAL FOUR;  Surgeon: Ashok Pall, MD;  Location: [place];  Service: Neurosurgery;  Laterality: N/A;  POSTERIOR    There were no vitals filed for this visit.  Subjective Assessment - 11/21/18 1349    Subjective  Pt. reports that her neck is doing well-requests focus on hip today. Moderate right hip pain 4/10 this PM.    Pertinent History  CAD, cervical fusion and calcification of ligament, ataxia, CHF, HTN, DM    Limitations  Lifting;Standing;Walking;House hold activities    How long can you sit comfortably?  no limitation    How long can you stand comfortably?  5 minutes    How long can you walk comfortably?  5 minutes    Diagnostic tests  none recently    Patient Stated Goals  less pain, walk better     Currently in Pain?  No/denies    Pain Score  4     Pain Location  Hip    Pain Orientation  Right;Lateral;Anterior    Pain Descriptors / Indicators  Aching;Tightness    Pain Type  Chronic pain    Pain Onset  More than a month ago    Pain Frequency  Constant    Aggravating Factors   standing and walking    Pain Relieving Factors  STM    Effect of Pain on Daily Activities  limits standing and walking tolerance         [facility] PT Assessment - 11/21/18 0001      AROM   Cervical Flexion  25    Cervical Extension  20    Cervical - Right Side Bend  20    Cervical - Left Side Bend  20    Cervical - Right Rotation  30    Cervical - Left Rotation  45                   [facility] Adult PT Treatment/Exercise - 11/21/18 0001      Knee/Hip Exercises: Stretches   ITB Stretch  Right;3 reps;30 seconds    ITB Stretch Limitations  supine manual stretch    Piriformis Stretch  Right;3 reps;30 seconds    Piriformis Stretch Limitations  supine manual  stretches      Knee/Hip Exercises: Standing   Hip Abduction  Stengthening;Right;2 sets;10 reps    Abduction Limitations  2 lbs.    Forward Step Up  Right;2 sets;10 reps;Hand Hold: 2;Step Height: 4"      Knee/Hip Exercises: Supine   Other Supine Knee/Hip Exercises  clamshells green band 2x10      Manual Therapy   Soft tissue mobilization  STM right lateral hip in left sidelying       Trigger Point Dry Needling - 11/21/18 0001    Consent Given?  Yes    Muscles Treated Back/Hip  Gluteus minimus;Gluteus medius;Piriformis    Electrical Stimulation Performed with Dry Needling  Yes    E-stim with Dry Needling Details  TENS 20 pps           PT Education - 11/21/18 1405    Education Details  POC    Person(s) Educated  Patient    Methods  Explanation    Comprehension  Verbalized understanding          PT Long Term Goals - 10/31/18 1516      PT LONG TERM GOAL #1   Title  Pt will be able to walk in the community as needed (with cart) and decreased Rt LE pain for shopping.     Baseline  previously met but today reports limited tolerance (able 5 min comfortably) due to hip pain-continue goal until consistently met    Time  6    Period  Weeks    Status  On-going    Target Date  12/12/18      PT LONG TERM GOAL #2   Title  Pt will be able to report no falls for >2 consecutive weeks and improved confidence     Baseline  met    Time  6    Period  Weeks    Status  Achieved      PT LONG TERM GOAL #3   Title  Pt will be able to be I with HEP for cervical ROM, UE and LE strength    Baseline  updates ongoing    Time  6    Period  Weeks    Status  On-going    Target Date  12/12/18      PT LONG TERM GOAL #4   Title  Pt will increase cervical AROM by 10 or more degrees in each direction, no lasting pain for improved driving, travel.     Baseline  see flowsheet, partially met    Time  6    Period  Weeks    Status  Partially Met    Target Date  12/12/18      PT LONG TERM  GOAL #5   Title  Functional strength goals TUG to 14 sec or better with cane    Baseline  18 sec with NBQC    Time  6    Period  Weeks    Status  On-going    Target Date  12/12/18            Plan - 11/21/18 1407    Clinical Impression Statement  Neck improving with decreased pain though still with tightness/decreased ROM. Fair status for hip but improved from baseline/symptoms less constant with subsequent functional gains for standing and walking tolerance.    Personal Factors and Comorbidities  Behavior Pattern    Comorbidities  DM, heart disease, cervical myelopathy     Examination-Activity Limitations  Squat;Stairs;Lift;Bend;Locomotion Level;Stand;Transfers    Examination-Participation Restrictions  Community Activity;Shop    Stability/Clinical Decision Making  Evolving/Moderate complexity    Clinical Decision Making  Moderate    Rehab Potential  Good    PT Frequency  2x / week    PT Duration  8 weeks    PT Treatment/Interventions  ADLs/Self Care Home Management;DME Instruction;Balance training;Passive range of motion;Neuromuscular re-education;Gait training;Dry needling;Manual techniques;Taping;Functional mobility training;Moist Heat;Electrical Stimulation;Therapeutic exercise;Patient/family education;Therapeutic activities    PT Next Visit Plan  continue include neck tx. prn but otherwise continue hip focus with strengthening, stretches, manual therapy, further dry needling prn    PT Home Exercise Plan  chin tuck, bands in supine (flexion/h abd), neck stretches, piriformis     Consulted and Agree with Plan of Care  Patient       Patient will benefit from skilled therapeutic intervention in order to improve the following deficits and impairments:  Abnormal gait, Increased fascial restricitons, Impaired sensation, Pain, Improper body mechanics, Postural dysfunction, Increased muscle spasms, Decreased coordination, Decreased mobility, Decreased activity tolerance, Decreased  endurance, Decreased range of motion, Decreased strength, Impaired UE functional use, Obesity, Impaired flexibility, Increased edema, Difficulty walking, Decreased balance  Visit Diagnosis: 1. Cervicalgia   2. Other abnormalities of gait and mobility   3. Pain in right hip   4. Muscle weakness (generalized)        Problem List Patient Active Problem List   Diagnosis Date Noted  . OSA (obstructive sleep apnea) 10/16/2017  . Cervical stenosis of spinal canal 10/23/2016  . Neck swelling 09/28/2011  . Weight gain 01/02/2011  . Carpal tunnel syndrome 11/02/2010  . Leg swelling 08/26/2010  . ADRENAL MASS, LEFT 05/23/2010  . SPINAL STENOSIS, LUMBAR 12/27/2009  . B12 DEFICIENCY 06/10/2009  . ANEMIA 06/10/2009  . BACK PAIN, LUMBAR 11/06/2008  . KNEE PAIN, LEFT 02/03/2008  . DIABETES MELLITUS, TYPE II, UNCONTROLLED 01/12/2008  . MENOPAUSE-RELATED VASOMOTOR SYMPTOMS, HOT FLASHES 06/23/2007  . BURSITIS, LEFT HIP 06/23/2007  . HYPERLIPIDEMIA 03/25/2007  . ANXIETY 03/25/2007  . DEPRESSION 03/25/2007  . PERIPHERAL NEUROPATHY 03/25/2007  . HYPERTENSION 03/25/2007  . CORONARY ARTERY DISEASE 03/25/2007  . DIVERTICULOSIS, COLON 03/25/2007  . TUBULOVILLOUS ADENOMA, COLON 02/18/2007  Beaulah Dinning, PT, DPT 11/21/18 2:15 PM  [facility] [address] [place], [name], [address] Phone: [phone]   Fax:  [phone]  Name: Matthew Pais MRN: [id number] Date of Birth: [birth year]

## 2018-11-22 ENCOUNTER — Telehealth: Payer: Self-pay | Admitting: Family Medicine

## 2018-11-22 ENCOUNTER — Other Ambulatory Visit: Payer: Self-pay | Admitting: Family Medicine

## 2018-11-22 MED ORDER — FLUCONAZOLE 150 MG PO TABS: 150.0000 mg | ORAL_TABLET | Freq: Once | ORAL | 0 refills | Status: DC

## 2018-11-22 NOTE — Telephone Encounter (Signed)
Pt called and is requesting the pill to help her deal with her yeast infection from taking the penicillin. Pt states she is raw. Please advise.   [id number] - [place], [place] [address]. AT [address]  [address]. [name] [address]  Phone: [phone] Fax: [phone]  Not a 24 hour pharmacy; exact hours not known.

## 2018-11-22 NOTE — Telephone Encounter (Signed)
diflulcan 150 mg sent to pharmacy.

## 2018-11-22 NOTE — Telephone Encounter (Signed)
See note

## 2018-11-22 NOTE — Telephone Encounter (Signed)
Attempted to contact patient to get more information. No answer. LVM. Dr. Volanda Napoleon please advise

## 2018-11-28 ENCOUNTER — Other Ambulatory Visit: Payer: Self-pay

## 2018-11-28 ENCOUNTER — Ambulatory Visit: Payer: Medicare Other | Admitting: Physical Therapy

## 2018-11-28 ENCOUNTER — Encounter: Payer: Self-pay | Admitting: Physical Therapy

## 2018-11-28 DIAGNOSIS — M25551 Pain in right hip: Secondary | ICD-10-CM

## 2018-11-28 DIAGNOSIS — M542 Cervicalgia: Secondary | ICD-10-CM

## 2018-11-28 DIAGNOSIS — M6281 Muscle weakness (generalized): Secondary | ICD-10-CM

## 2018-11-28 DIAGNOSIS — R2689 Other abnormalities of gait and mobility: Secondary | ICD-10-CM

## 2018-11-28 NOTE — Therapy (Signed)
[place], [name], [address] Phone: [phone]   Fax:  [phone]  Physical Therapy Treatment  Patient Details  Name: [name] Bean MRN: [id number] Date of Birth: January 06, 1951 Referring Provider (PT): Dr. Christella Noa    Encounter Date: 11/28/2018  PT End of Session - 11/28/18 1134    Visit Number  15    Number of Visits  21    Date for PT Re-Evaluation  12/12/18    Authorization Type  UHC Medicare, next progress note by visit 34, add kx at visit 15    PT Start Time  1134    PT Stop Time  1230    PT Time Calculation (min)  56 min    Activity Tolerance  Patient tolerated treatment well    Behavior During Therapy  [facility] for tasks assessed/performed       Past Medical History:  Diagnosis Date  . Anemia   . Anxiety   . Arthritis   . Asthma   . Broken arm    left arm  . Bronchitis   . Bursitis of left hip   . CAD (coronary artery disease)   . Candidiasis, vagina   . Cardiac arrest ([place]) 06/20/2016   at [facility]; ? due to flash pulm edema vs undertreated chronic CHF  . Cerumen impaction   . CHF (congestive heart failure) ([place])    RECENT ADMIT TO [facility]   . Colon, diverticulosis   . Depression   . Diabetes mellitus    15 YRS AGO  . Fatigue   . Gastroenteritis   . Hot flashes, menopausal   . Hyperlipidemia   . Knee pain, left   . Lumbar back pain   . Maxillary sinusitis    history  . Muscle tear    right gluteus  . Nausea   . OSA (obstructive sleep apnea) 10/16/2017  . Other B-complex deficiencies   . Otitis media, acute    left  . Peripheral neuropathy   . Spinal stenosis of lumbar region   . Tubulovillous adenoma of colon   . Unspecified essential hypertension     Past Surgical History:  Procedure Laterality Date  . CARDIAC CATHETERIZATION  2015, 2009, 2006  . CERVICAL SPINE SURGERY  2003  . CORONARY ANGIOPLASTY WITH STENT PLACEMENT  2002, 2015  . EYE SURGERY     cataracts  bilaterally  . POSTERIOR CERVICAL LAMINECTOMY N/A 10/23/2016   Procedure: POSTERIOR CERVICAL LAMINECTOMY MULTI LEVEL CERVICAL TWO- CERVICAL THREE, CERVICAL THREE- CERVICAL FOUR;  Surgeon: Ashok Pall, MD;  Location: [place];  Service: Neurosurgery;  Laterality: N/A;  POSTERIOR    There were no vitals filed for this visit.  Subjective Assessment - 11/28/18 1139    Subjective  Neck feels better the hip feel bruised.  Overall better.  I just need to come here everyday.    Currently in Pain?  Yes    Pain Score  4     Pain Location  Hip    Pain Orientation  Right;Lateral    Pain Type  Chronic pain    Pain Onset  More than a month ago    Aggravating Factors   stand, walk    Pain Relieving Factors  PT , dry needling    Multiple Pain Sites  No    Pain Score  0    Pain Location  Neck    Pain Orientation  Right  [facility] Adult PT Treatment/Exercise - 11/28/18 0001      Knee/Hip Exercises: Stretches   Piriformis Stretch  Right;Left;2 reps;30 seconds    Piriformis Stretch Limitations  towel to pull knee across     Other Knee/Hip Stretches  knee to chest x 3 x 30 sec opp leg ext.       Knee/Hip Exercises: Standing   Forward Lunges Limitations  wall slide with bilateral UEs for anterior hip stretching, posture       Knee/Hip Exercises: Supine   Other Supine Knee/Hip Exercises  supine LTR with head turns x 10       Shoulder Exercises: Supine   Horizontal ABduction  Strengthening;Both;12 reps    Theraband Level (Shoulder Horizontal ABduction)  Level 2 (Red)    External Rotation  Strengthening;Both;12 reps    Theraband Level (Shoulder External Rotation)  Level 2 (Red)    Flexion  Strengthening;Both;10 reps    Theraband Level (Shoulder Flexion)  Level 2 (Red)      Moist Heat Therapy   Number Minutes Moist Heat  10 Minutes    Moist Heat Location  Hip      Manual Therapy   Soft tissue mobilization  IASTM roller Rt lateral posterior hip and thigh       Neck Exercises:  Stretches   Upper Trapezius Stretch  Right;Left;2 reps;30 seconds    Other Neck Stretches  rotation bilateral with light overpressure by pt.  x 3 each side           PT Long Term Goals - 10/31/18 1516      PT LONG TERM GOAL #1   Title  Pt will be able to walk in the community as needed (with cart) and decreased Rt LE pain for shopping.     Baseline  previously met but today reports limited tolerance (able 5 min comfortably) due to hip pain-continue goal until consistently met    Time  6    Period  Weeks    Status  On-going    Target Date  12/12/18      PT LONG TERM GOAL #2   Title  Pt will be able to report no falls for >2 consecutive weeks and improved confidence     Baseline  met    Time  6    Period  Weeks    Status  Achieved      PT LONG TERM GOAL #3   Title  Pt will be able to be I with HEP for cervical ROM, UE and LE strength    Baseline  updates ongoing    Time  6    Period  Weeks    Status  On-going    Target Date  12/12/18      PT LONG TERM GOAL #4   Title  Pt will increase cervical AROM by 10 or more degrees in each direction, no lasting pain for improved driving, travel.     Baseline  see flowsheet, partially met    Time  6    Period  Weeks    Status  Partially Met    Target Date  12/12/18      PT LONG TERM GOAL #5   Title  Functional strength goals TUG to 14 sec or better with cane    Baseline  18 sec with NBQC    Time  6    Period  Weeks    Status  On-going    Target Date  12/12/18  Plan - 11/28/18 1149    Clinical Impression Statement  Patient has maintained a fair level of pain and function in neck despite limitations in Cervical ROM.  Her hip pain is also well controlled.  She is nearing the end of her POC and she would like to continue in the Pilates studio.  She needs a home routine she will stick to and she has not done that.    PT Treatment/Interventions  ADLs/Self Care Home Management;DME Instruction;Balance training;Passive  range of motion;Neuromuscular re-education;Gait training;Dry needling;Manual techniques;Taping;Functional mobility training;Moist Heat;Electrical Stimulation;Therapeutic exercise;Patient/family education;Therapeutic activities    PT Next Visit Plan  Reformer, DN, finish POC .    PT Home Exercise Plan  chin tuck, bands in supine (flexion/h abd), neck stretches, piriformis , LTR with head turns    Consulted and Agree with Plan of Care  Patient       Patient will benefit from skilled therapeutic intervention in order to improve the following deficits and impairments:  Abnormal gait, Increased fascial restricitons, Impaired sensation, Pain, Improper body mechanics, Postural dysfunction, Increased muscle spasms, Decreased coordination, Decreased mobility, Decreased activity tolerance, Decreased endurance, Decreased range of motion, Decreased strength, Impaired UE functional use, Obesity, Impaired flexibility, Increased edema, Difficulty walking, Decreased balance  Visit Diagnosis: 1. Cervicalgia   2. Other abnormalities of gait and mobility   3. Pain in right hip   4. Muscle weakness (generalized)        Problem List Patient Active Problem List   Diagnosis Date Noted  . OSA (obstructive sleep apnea) 10/16/2017  . Cervical stenosis of spinal canal 10/23/2016  . Neck swelling 09/28/2011  . Weight gain 01/02/2011  . Carpal tunnel syndrome 11/02/2010  . Leg swelling 08/26/2010  . ADRENAL MASS, LEFT 05/23/2010  . SPINAL STENOSIS, LUMBAR 12/27/2009  . B12 DEFICIENCY 06/10/2009  . ANEMIA 06/10/2009  . BACK PAIN, LUMBAR 11/06/2008  . KNEE PAIN, LEFT 02/03/2008  . DIABETES MELLITUS, TYPE II, UNCONTROLLED 01/12/2008  . MENOPAUSE-RELATED VASOMOTOR SYMPTOMS, HOT FLASHES 06/23/2007  . BURSITIS, LEFT HIP 06/23/2007  . HYPERLIPIDEMIA 03/25/2007  . ANXIETY 03/25/2007  . DEPRESSION 03/25/2007  . PERIPHERAL NEUROPATHY 03/25/2007  . HYPERTENSION 03/25/2007  . CORONARY ARTERY DISEASE 03/25/2007  .  DIVERTICULOSIS, COLON 03/25/2007  . TUBULOVILLOUS ADENOMA, COLON 02/18/2007    Jazen Spraggins 11/28/2018, 12:52 PM  [facility] [address] [place], [name], [address] Phone: [phone]   Fax:  [phone]  Name: Avaley Coop MRN: [id number] Date of Birth: 1950-09-17  Raeford Razor, PT 11/28/18 12:53 PM Phone: [phone] Fax: [phone]

## 2018-12-06 ENCOUNTER — Other Ambulatory Visit: Payer: Self-pay | Admitting: Family Medicine

## 2018-12-07 ENCOUNTER — Other Ambulatory Visit: Payer: Self-pay

## 2018-12-07 ENCOUNTER — Other Ambulatory Visit: Payer: Self-pay | Admitting: Family Medicine

## 2018-12-07 ENCOUNTER — Ambulatory Visit: Payer: Medicare Other | Admitting: Physical Therapy

## 2018-12-07 ENCOUNTER — Encounter: Payer: Self-pay | Admitting: Physical Therapy

## 2018-12-07 DIAGNOSIS — R2689 Other abnormalities of gait and mobility: Secondary | ICD-10-CM

## 2018-12-07 DIAGNOSIS — M542 Cervicalgia: Secondary | ICD-10-CM | POA: Diagnosis not present

## 2018-12-07 DIAGNOSIS — M25551 Pain in right hip: Secondary | ICD-10-CM

## 2018-12-07 DIAGNOSIS — F419 Anxiety disorder, unspecified: Secondary | ICD-10-CM

## 2018-12-07 DIAGNOSIS — M6281 Muscle weakness (generalized): Secondary | ICD-10-CM

## 2018-12-07 DIAGNOSIS — F329 Major depressive disorder, single episode, unspecified: Secondary | ICD-10-CM

## 2018-12-07 NOTE — Therapy (Signed)
[place], [name], [address] Phone: [phone]   Fax:  [phone]  Physical Therapy Treatment  Patient Details  Name: [name] Bean MRN: [id number] Date of Birth: [birth year] Referring Provider (PT): Dr. Christella Noa    Encounter Date: 12/07/2018  PT End of Session - 12/07/18 1335    Visit Number  16    Number of Visits  21    Date for PT Re-Evaluation  12/12/18    Authorization Type  [employer]HC Medicare, next progress note by visit 22, add kx at visit 15    PT Start Time  1219    PT Stop Time  1300    PT Time Calculation (min)  41 min    Activity Tolerance  Patient tolerated treatment well    Behavior During Therapy  [facility] for tasks assessed/performed       Past Medical History:  Diagnosis Date  . Anemia   . Anxiety   . Arthritis   . Asthma   . Broken arm    left arm  . Bronchitis   . Bursitis of left hip   . CAD (coronary artery disease)   . Candidiasis, vagina   . Cardiac arrest ([place]) 06/20/2016   at [facility]; ? due to flash pulm edema vs undertreated chronic CHF  . Cerumen impaction   . CHF (congestive heart failure) ([place])    RECENT ADMIT TO [facility]   . Colon, diverticulosis   . Depression   . Diabetes mellitus    15 YRS AGO  . Fatigue   . Gastroenteritis   . Hot flashes, menopausal   . Hyperlipidemia   . Knee pain, left   . Lumbar back pain   . Maxillary sinusitis    history  . Muscle tear    right gluteus  . Nausea   . OSA (obstructive sleep apnea) 10/16/2017  . Other B-complex deficiencies   . Otitis media, acute    left  . Peripheral neuropathy   . Spinal stenosis of lumbar region   . Tubulovillous adenoma of colon   . Unspecified essential hypertension     Past Surgical History:  Procedure Laterality Date  . CARDIAC CATHETERIZATION  2015, 2009, 2006  . CERVICAL SPINE SURGERY  2003  . CORONARY ANGIOPLASTY WITH STENT PLACEMENT  2002, 2015  . EYE SURGERY     cataracts  bilaterally  . POSTERIOR CERVICAL LAMINECTOMY N/A 10/23/2016   Procedure: POSTERIOR CERVICAL LAMINECTOMY MULTI LEVEL CERVICAL TWO- CERVICAL THREE, CERVICAL THREE- CERVICAL FOUR;  Surgeon: Ashok Pall, MD;  Location: [place];  Service: Neurosurgery;  Laterality: N/A;  POSTERIOR    There were no vitals filed for this visit.  Subjective Assessment - 12/07/18 1333    Subjective  No report of pain today.    Currently in Pain?  No/denies        Pilates Tower for LE/Core strength, postural strength, lumbopelvic disassociation and core control.  Exercises included:  Supine Leg [place] 1 Yellow single leg arcs and circles with opposite knee bent for stability.   Sidelying Leg [place] 1 Yellow hip flexion/extension in abduction x 10 and then Straight leg flexion/extension.   Arm [place] 1 Slastix  Arms in hooklying x 15, then added leg lift alternating, legs in table top   Min A needed for LE and cues for lower abdominals , drawing in        PT Long Term Goals - 12/07/18 1338      PT LONG  TERM GOAL #1   Title  Pt will be able to walk in the community as needed (with cart) and decreased Rt LE pain for shopping.     Status  On-going      PT LONG TERM GOAL #2   Title  Pt will be able to report no falls for >2 consecutive weeks and improved confidence     Status  Achieved      PT LONG TERM GOAL #3   Title  Pt will be able to be I with HEP for cervical ROM, UE and LE strength    Status  On-going      PT LONG TERM GOAL #4   Title  Pt will increase cervical AROM by 10 or more degrees in each direction, no lasting pain for improved driving, travel.     Status  Unable to assess      PT LONG TERM GOAL #5   Title  Functional strength goals TUG to 14 sec or better with cane    Status  Unable to assess            Plan - 12/07/18 1339    Clinical Impression Statement  Focused today on only Pilates exercises on the Tower as she plans to transition to the community based Pilates class.   She had no complaint of pain.  She needed min A for maintaining alignment (legs in tabletop)  and donning doffing straps on feet.    PT Treatment/Interventions  ADLs/Self Care Home Management;DME Instruction;Balance training;Passive range of motion;Neuromuscular re-education;Gait training;Dry needling;Manual techniques;Taping;Functional mobility training;Moist Heat;Electrical Stimulation;Therapeutic exercise;Patient/family education;Therapeutic activities    PT Next Visit Plan  Reformer, DN, check TUG, goals.  I will likely extend her until her visits are up.    PT Home Exercise Plan  chin tuck, bands in supine (flexion/h abd), neck stretches, piriformis , LTR with head turns    Consulted and Agree with Plan of Care  Patient       Patient will benefit from skilled therapeutic intervention in order to improve the following deficits and impairments:  Abnormal gait, Increased fascial restricitons, Impaired sensation, Pain, Improper body mechanics, Postural dysfunction, Increased muscle spasms, Decreased coordination, Decreased mobility, Decreased activity tolerance, Decreased endurance, Decreased range of motion, Decreased strength, Impaired UE functional use, Obesity, Impaired flexibility, Increased edema, Difficulty walking, Decreased balance  Visit Diagnosis: Cervicalgia  Other abnormalities of gait and mobility  Pain in right hip  Muscle weakness (generalized)     Problem List Patient Active Problem List   Diagnosis Date Noted  . OSA (obstructive sleep apnea) 10/16/2017  . Cervical stenosis of spinal canal 10/23/2016  . Neck swelling 09/28/2011  . Weight gain 01/02/2011  . Carpal tunnel syndrome 11/02/2010  . Leg swelling 08/26/2010  . ADRENAL MASS, LEFT 05/23/2010  . SPINAL STENOSIS, LUMBAR 12/27/2009  . B12 DEFICIENCY 06/10/2009  . ANEMIA 06/10/2009  . BACK PAIN, LUMBAR 11/06/2008  . KNEE PAIN, LEFT 02/03/2008  . DIABETES MELLITUS, TYPE II, UNCONTROLLED 01/12/2008  .  MENOPAUSE-RELATED VASOMOTOR SYMPTOMS, HOT FLASHES 06/23/2007  . BURSITIS, LEFT HIP 06/23/2007  . HYPERLIPIDEMIA 03/25/2007  . ANXIETY 03/25/2007  . DEPRESSION 03/25/2007  . PERIPHERAL NEUROPATHY 03/25/2007  . HYPERTENSION 03/25/2007  . CORONARY ARTERY DISEASE 03/25/2007  . DIVERTICULOSIS, COLON 03/25/2007  . TUBULOVILLOUS ADENOMA, COLON 02/18/2007    Maxfield Gildersleeve 12/07/2018, 1:44 PM  [facility] [address] [place], [name], [address] Phone: [phone]   Fax:  [phone]  Name: [name] Bean [facility]  MRN: [id number] Date of Birth: 06-Dec-1950  Raeford Razor, PT 12/07/18 1:48 PM Phone: [phone] Fax: [phone]

## 2018-12-08 ENCOUNTER — Ambulatory Visit: Payer: Medicare Other | Admitting: Physical Therapy

## 2018-12-08 ENCOUNTER — Encounter: Payer: Self-pay | Admitting: Physical Therapy

## 2018-12-08 DIAGNOSIS — M6281 Muscle weakness (generalized): Secondary | ICD-10-CM

## 2018-12-08 DIAGNOSIS — R2689 Other abnormalities of gait and mobility: Secondary | ICD-10-CM

## 2018-12-08 DIAGNOSIS — M25551 Pain in right hip: Secondary | ICD-10-CM

## 2018-12-08 DIAGNOSIS — M542 Cervicalgia: Secondary | ICD-10-CM

## 2018-12-08 NOTE — Therapy (Signed)
[place], [name], [address] Phone: [phone]   Fax:  [phone]  Physical Therapy Treatment  Patient Details  Name: [name] Bean MRN: [id number] Date of Birth: 14-Sep-1950 Referring Provider (PT): Dr. Christella Noa    Encounter Date: 12/08/2018  PT End of Session - 12/08/18 1450    Visit Number  17    Number of Visits  21    Date for PT Re-Evaluation  12/12/18    Authorization Type  UHC Medicare, next progress note by visit 68, add kx at visit 15    PT Start Time  1415    PT Stop Time  1456    PT Time Calculation (min)  41 min    Activity Tolerance  Patient tolerated treatment well    Behavior During Therapy  [facility] for tasks assessed/performed       Past Medical History:  Diagnosis Date  . Anemia   . Anxiety   . Arthritis   . Asthma   . Broken arm    left arm  . Bronchitis   . Bursitis of left hip   . CAD (coronary artery disease)   . Candidiasis, vagina   . Cardiac arrest ([place]) 06/20/2016   at [facility]; ? due to flash pulm edema vs undertreated chronic CHF  . Cerumen impaction   . CHF (congestive heart failure) ([place])    [facility]ENT ADMIT TO [facility]   . Colon, diverticulosis   . Depression   . Diabetes mellitus    15 YRS AGO  . Fatigue   . Gastroenteritis   . Hot flashes, menopausal   . Hyperlipidemia   . Knee pain, left   . Lumbar back pain   . Maxillary sinusitis    history  . Muscle tear    right gluteus  . Nausea   . OSA (obstructive sleep apnea) 10/16/2017  . Other B-complex deficiencies   . Otitis media, acute    left  . Peripheral neuropathy   . Spinal stenosis of lumbar region   . Tubulovillous adenoma of colon   . Unspecified essential hypertension     Past Surgical History:  Procedure Laterality Date  . CARDIAC CATHETERIZATION  2015, 2009, 2006  . CERVICAL SPINE SURGERY  2003  . CORONARY ANGIOPLASTY WITH STENT PLACEMENT  2002, 2015  . EYE SURGERY     cataracts  bilaterally  . POSTERIOR CERVICAL LAMINECTOMY N/A 10/23/2016   Procedure: POSTERIOR CERVICAL LAMINECTOMY MULTI LEVEL CERVICAL TWO- CERVICAL THREE, CERVICAL THREE- CERVICAL FOUR;  Surgeon: Ashok Pall, MD;  Location: [place];  Service: Neurosurgery;  Laterality: N/A;  POSTERIOR    There were no vitals filed for this visit.  Subjective Assessment - 12/08/18 1431    Subjective  Pt. c/o tightness and some pain in right glut region today-requests to focus on hip today    Pertinent History  CAD, cervical fusion and calcification of ligament, ataxia, CHF, HTN, DM    Limitations  Lifting;Standing;Walking;House hold activities    Patient Stated Goals  less pain, walk better     Currently in Pain?  Yes    Pain Score  3     Pain Location  Hip    Pain Orientation  Right;Posterior;Lateral    Pain Descriptors / Indicators  Aching;Tightness    Pain Type  Chronic pain    Pain Onset  More than a month ago    Pain Frequency  Intermittent    Aggravating Factors   standing and  walking    Pain Relieving Factors  PT, dry needling    Effect of Pain on Daily Activities  limits standing and walking tolerance                       [facility] Adult PT Treatment/Exercise - 12/08/18 0001      Knee/Hip Exercises: Stretches   Piriformis Stretch  Right;3 reps;30 seconds    Other Knee/Hip Stretches  R manual glut stretch 3x30 sec, right IT band stretch 3x30 sec      Knee/Hip Exercises: Sidelying   Hip ABduction  AROM;Right;15 reps    Clams  15 reps RLE    Other Sidelying Knee/Hip Exercises  right hip ext with slight PT resistance x 15 reps      Manual Therapy   Soft tissue mobilization  STM/IASTM incl roller use right lateral/posterolateral hip-glut med and glut max, piriformis       Trigger Point Dry Needling - 12/08/18 0001    Consent Given?  Yes    Muscles Treated Back/Hip  Gluteus medius;Gluteus maximus;Piriformis    Electrical Stimulation Performed with Dry Needling  Yes    E-stim with Dry  Needling Details  TENS 20 pps x 10 minutes           PT Education - 12/08/18 1450    Education Details  POC    Person(s) Educated  Patient    Methods  Explanation    Comprehension  Verbalized understanding          PT Long Term Goals - 12/07/18 1338      PT LONG TERM GOAL #1   Title  Pt will be able to walk in the community as needed (with cart) and decreased Rt LE pain for shopping.     Status  On-going      PT LONG TERM GOAL #2   Title  Pt will be able to report no falls for >2 consecutive weeks and improved confidence     Status  Achieved      PT LONG TERM GOAL #3   Title  Pt will be able to be I with HEP for cervical ROM, UE and LE strength    Status  On-going      PT LONG TERM GOAL #4   Title  Pt will increase cervical AROM by 10 or more degrees in each direction, no lasting pain for improved driving, travel.     Status  Unable to assess      PT LONG TERM GOAL #5   Title  Functional strength goals TUG to 14 sec or better with cane    Status  Unable to assess            Plan - 12/08/18 1451    Clinical Impression Statement  Tx. focus on hip as primary complaint/as noted in subjective. More tx. focus on glut max than previously given tightness and pain this region. Responds well to tx. for decreased pain with fair progress overall given tendency some level of symptom recurrence. Verbal/tactile cues for form, correct plane of motion with exercises.    Personal Factors and Comorbidities  Behavior Pattern    Comorbidities  DM, heart disease, cervical myelopathy     Examination-Activity Limitations  Squat;Stairs;Lift;Bend;Locomotion Level;Stand;Transfers    Stability/Clinical Decision Making  Evolving/Moderate complexity    Clinical Decision Making  Moderate    Rehab Potential  Good    PT Frequency  2x / week    PT  Duration  8 weeks    PT Treatment/Interventions  ADLs/Self Care Home Management;DME Instruction;Balance training;Passive range of  motion;Neuromuscular re-education;Gait training;Dry needling;Manual techniques;Taping;Functional mobility training;Moist Heat;Electrical Stimulation;Therapeutic exercise;Patient/family education;Therapeutic activities    PT Next Visit Plan  Reformer, DN, check TUG, goals.  I will likely extend her until her visits are up.    PT Home Exercise Plan  chin tuck, bands in supine (flexion/h abd), neck stretches, piriformis , LTR with head turns    Consulted and Agree with Plan of Care  Patient       Patient will benefit from skilled therapeutic intervention in order to improve the following deficits and impairments:  Abnormal gait, Increased fascial restricitons, Impaired sensation, Pain, Improper body mechanics, Postural dysfunction, Increased muscle spasms, Decreased coordination, Decreased mobility, Decreased activity tolerance, Decreased endurance, Decreased range of motion, Decreased strength, Impaired UE functional use, Obesity, Impaired flexibility, Increased edema, Difficulty walking, Decreased balance  Visit Diagnosis: Cervicalgia  Other abnormalities of gait and mobility  Pain in right hip  Muscle weakness (generalized)     Problem List Patient Active Problem List   Diagnosis Date Noted  . OSA (obstructive sleep apnea) 10/16/2017  . Cervical stenosis of spinal canal 10/23/2016  . Neck swelling 09/28/2011  . Weight gain 01/02/2011  . Carpal tunnel syndrome 11/02/2010  . Leg swelling 08/26/2010  . ADRENAL MASS, LEFT 05/23/2010  . SPINAL STENOSIS, LUMBAR 12/27/2009  . B12 DEFICIENCY 06/10/2009  . ANEMIA 06/10/2009  . BACK PAIN, LUMBAR 11/06/2008  . KNEE PAIN, LEFT 02/03/2008  . DIABETES MELLITUS, TYPE II, UNCONTROLLED 01/12/2008  . MENOPAUSE-RELATED VASOMOTOR SYMPTOMS, HOT FLASHES 06/23/2007  . BURSITIS, LEFT HIP 06/23/2007  . HYPERLIPIDEMIA 03/25/2007  . ANXIETY 03/25/2007  . DEPRESSION 03/25/2007  . PERIPHERAL NEUROPATHY 03/25/2007  . HYPERTENSION 03/25/2007  . CORONARY  ARTERY DISEASE 03/25/2007  . DIVERTICULOSIS, COLON 03/25/2007  . TUBULOVILLOUS ADENOMA, COLON 02/18/2007   Beaulah Dinning, PT, DPT 12/08/18 2:54 PM  [facility] [address] [place], [name], [address] Phone: [phone]   Fax:  [phone]  Name: Chelita Haver MRN: [id number] Date of Birth: Sep 15, 1950

## 2018-12-12 ENCOUNTER — Encounter: Payer: Self-pay | Admitting: Physical Therapy

## 2018-12-12 ENCOUNTER — Other Ambulatory Visit: Payer: Self-pay

## 2018-12-12 ENCOUNTER — Ambulatory Visit: Payer: Medicare Other | Admitting: Physical Therapy

## 2018-12-12 DIAGNOSIS — M542 Cervicalgia: Secondary | ICD-10-CM | POA: Diagnosis not present

## 2018-12-12 DIAGNOSIS — M25551 Pain in right hip: Secondary | ICD-10-CM

## 2018-12-12 DIAGNOSIS — M6281 Muscle weakness (generalized): Secondary | ICD-10-CM

## 2018-12-12 DIAGNOSIS — R2689 Other abnormalities of gait and mobility: Secondary | ICD-10-CM

## 2018-12-12 NOTE — Therapy (Signed)
[place], [name], [address] Phone: [phone]   Fax:  [phone]  Physical Therapy Treatment/Renewal   Patient Details  Name: [name] Bean MRN: [id number] Date of Birth: Jul 15, 1950 Referring Provider (PT): Dr. Christella Noa    Encounter Date: 12/12/2018  PT End of Session - 12/12/18 1328    Visit Number  18    Number of Visits  21    Date for PT Re-Evaluation  01/09/19    Authorization Type  UHC Medicare, next progress note by visit 62, add kx at visit 15    PT Start Time  1319    PT Stop Time  1400    PT Time Calculation (min)  41 min    Activity Tolerance  Patient tolerated treatment well    Behavior During Therapy  [facility] for tasks assessed/performed       Past Medical History:  Diagnosis Date  . Anemia   . Anxiety   . Arthritis   . Asthma   . Broken arm    left arm  . Bronchitis   . Bursitis of left hip   . CAD (coronary artery disease)   . Candidiasis, vagina   . Cardiac arrest ([place]) 06/20/2016   at [facility]; ? due to flash pulm edema vs undertreated chronic CHF  . Cerumen impaction   . CHF (congestive heart failure) ([place])    RECENT ADMIT TO [facility]   . Colon, diverticulosis   . Depression   . Diabetes mellitus    15 YRS AGO  . Fatigue   . Gastroenteritis   . Hot flashes, menopausal   . Hyperlipidemia   . Knee pain, left   . Lumbar back pain   . Maxillary sinusitis    history  . Muscle tear    right gluteus  . Nausea   . OSA (obstructive sleep apnea) 10/16/2017  . Other B-complex deficiencies   . Otitis media, acute    left  . Peripheral neuropathy   . Spinal stenosis of lumbar region   . Tubulovillous adenoma of colon   . Unspecified essential hypertension     Past Surgical History:  Procedure Laterality Date  . CARDIAC CATHETERIZATION  2015, 2009, 2006  . CERVICAL SPINE SURGERY  2003  . CORONARY ANGIOPLASTY WITH STENT PLACEMENT  2002, 2015  . EYE SURGERY     cataracts bilaterally  . POSTERIOR CERVICAL LAMINECTOMY N/A 10/23/2016   Procedure: POSTERIOR CERVICAL LAMINECTOMY MULTI LEVEL CERVICAL TWO- CERVICAL THREE, CERVICAL THREE- CERVICAL FOUR;  Surgeon: Ashok Pall, MD;  Location: [facility];  Service: Neurosurgery;  Laterality: N/A;  POSTERIOR    There were no vitals filed for this visit.  Subjective Assessment - 12/12/18 1321    Subjective  Pt fell yesterday.  Leg was spasming all day and then at one point my L leg gave out.  I was able to get up in 5 min, got on hands and knees and scooted up. I was so mad. Friend helped roll her over.    Currently in Pain?  No/denies        [facility] Adult PT Treatment/Exercise - 12/12/18 0001      Pilates   Pilates Reformer  See note        Pilates Reformer used for LE/core strength, postural strength, lumbopelvic disassociation and core control.  Exercises included:  Footwork 2 red 1 blue parallel and turnout, on heels, toes.  Cues needed for LE alignment and maintaining neutral spine, connecting  to her breath.   Wide knees for hip opening Windshield wiper legs for ER /IR   Bridging [place] x 10 , 2 Red 1 blue   Feet in Straps 1 red for hamstring and anterior hip stretch      PT Long Term Goals - 12/12/18 1404      PT LONG TERM GOAL #1   Title  Pt will be able to walk in the community as needed (with cart) and decreased Rt LE pain for shopping.     Baseline  met 50% of the time    Status  On-going      PT LONG TERM GOAL #2   Title  Pt will be able to report no falls for >2 consecutive weeks and improved confidence     Baseline  was met, fell yesterday    Status  On-going      PT LONG TERM GOAL #3   Title  Pt will be able to be I with HEP for cervical ROM, UE and LE strength    Status  On-going      PT LONG TERM GOAL #4   Title  Pt will increase cervical AROM by 10 or more degrees in each direction, no lasting pain for improved driving, travel.     Status  On-going      PT LONG TERM  GOAL #5   Title  Functional strength goals TUG to 14 sec or better with cane    Status  Unable to assess            Plan - 12/12/18 1405    Clinical Impression Statement  Patient continues to have difficulty walking due to spasms, ataxia.  She did fall, but was able ot get up in a short amount of time, which is progress. She was renewed today to allow her to cover her last few appts.  She is doing Pilates here with our equipment and is motivted to work with a International aid/development worker.  She feels, in her words, "successful" with Pilates and I feel it will give her dynamic strengthening and body awareness that no other modality of exercise would offer.  no pain post.    PT Treatment/Interventions  ADLs/Self Care Home Management;DME Instruction;Balance training;Passive range of motion;Neuromuscular re-education;Gait training;Dry needling;Manual techniques;Taping;Functional mobility training;Moist Heat;Electrical Stimulation;Therapeutic exercise;Patient/family education;Therapeutic activities    PT Next Visit Plan  Reformer, DN, check TUG    PT Home Exercise Plan  chin tuck, bands in supine (flexion/h abd), neck stretches, piriformis , LTR with head turns    Consulted and Agree with Plan of Care  Patient       Patient will benefit from skilled therapeutic intervention in order to improve the following deficits and impairments:  Abnormal gait, Increased fascial restricitons, Impaired sensation, Pain, Improper body mechanics, Postural dysfunction, Increased muscle spasms, Decreased coordination, Decreased mobility, Decreased activity tolerance, Decreased endurance, Decreased range of motion, Decreased strength, Impaired UE functional use, Obesity, Impaired flexibility, Increased edema, Difficulty walking, Decreased balance  Visit Diagnosis: Cervicalgia  Other abnormalities of gait and mobility  Pain in right hip  Muscle weakness (generalized)     Problem List Patient Active Problem List    Diagnosis Date Noted  . OSA (obstructive sleep apnea) 10/16/2017  . Cervical stenosis of spinal canal 10/23/2016  . Neck swelling 09/28/2011  . Weight gain 01/02/2011  . Carpal tunnel syndrome 11/02/2010  . Leg swelling 08/26/2010  . ADRENAL MASS, LEFT 05/23/2010  . SPINAL STENOSIS, LUMBAR 12/27/2009  .  B12 DEFICIENCY 06/10/2009  . ANEMIA 06/10/2009  . BACK PAIN, LUMBAR 11/06/2008  . KNEE PAIN, LEFT 02/03/2008  . DIABETES MELLITUS, TYPE II, UNCONTROLLED 01/12/2008  . MENOPAUSE-RELATED VASOMOTOR SYMPTOMS, HOT FLASHES 06/23/2007  . BURSITIS, LEFT HIP 06/23/2007  . HYPERLIPIDEMIA 03/25/2007  . ANXIETY 03/25/2007  . DEPRESSION 03/25/2007  . PERIPHERAL NEUROPATHY 03/25/2007  . HYPERTENSION 03/25/2007  . CORONARY ARTERY DISEASE 03/25/2007  . DIVERTICULOSIS, COLON 03/25/2007  . TUBULOVILLOUS ADENOMA, COLON 02/18/2007    Topanga Alvelo 12/12/2018, 2:51 PM  [facility] [address] [place], [name], [address] Phone: [phone]   Fax:  [phone]  Name: [name] Bean MRN: [id number] Date of Birth: 05/22/1950  Raeford Razor, PT 12/12/18 2:51 PM Phone: [phone] Fax: [phone]

## 2018-12-14 ENCOUNTER — Encounter: Payer: Self-pay | Admitting: Physical Therapy

## 2018-12-14 ENCOUNTER — Other Ambulatory Visit: Payer: Self-pay

## 2018-12-14 ENCOUNTER — Ambulatory Visit: Payer: Medicare Other | Attending: Neurosurgery | Admitting: Physical Therapy

## 2018-12-14 DIAGNOSIS — M6281 Muscle weakness (generalized): Secondary | ICD-10-CM | POA: Insufficient documentation

## 2018-12-14 DIAGNOSIS — M25551 Pain in right hip: Secondary | ICD-10-CM | POA: Diagnosis present

## 2018-12-14 DIAGNOSIS — R2689 Other abnormalities of gait and mobility: Secondary | ICD-10-CM | POA: Insufficient documentation

## 2018-12-14 DIAGNOSIS — M542 Cervicalgia: Secondary | ICD-10-CM | POA: Diagnosis present

## 2018-12-14 NOTE — Therapy (Addendum)
[place], [name], [address] Phone: [phone]   Fax:  [phone]  Physical Therapy Treatment    Progress Note Reporting Period 09/09/18 to 12/14/18  See note below for Objective Data and Assessment of Progress/Goals.      Patient Details  Name: [name] Bean MRN: [id number] Date of Birth: [birth year] Referring Provider (PT): Dr. Christella Noa    Encounter Date: 12/14/2018  PT End of Session - 12/14/18 1343    Visit Number  19    Number of Visits  21    Date for PT Re-Evaluation  01/09/19    Authorization Type  UHC Medicare, next progress note by visit 37, add kx at visit 15    PT Start Time  1317    PT Stop Time  1400    PT Time Calculation (min)  43 min    Activity Tolerance  Patient tolerated treatment well    Behavior During Therapy  [facility] for tasks assessed/performed       Past Medical History:  Diagnosis Date  . Anemia   . Anxiety   . Arthritis   . Asthma   . Broken arm    left arm  . Bronchitis   . Bursitis of left hip   . CAD (coronary artery disease)   . Candidiasis, vagina   . Cardiac arrest ([place]) 06/20/2016   at [facility]; ? due to flash pulm edema vs undertreated chronic CHF  . Cerumen impaction   . CHF (congestive heart failure) ([place])    RECENT ADMIT TO [facility]   . Colon, diverticulosis   . Depression   . Diabetes mellitus    15 YRS AGO  . Fatigue   . Gastroenteritis   . Hot flashes, menopausal   . Hyperlipidemia   . Knee pain, left   . Lumbar back pain   . Maxillary sinusitis    history  . Muscle tear    right gluteus  . Nausea   . OSA (obstructive sleep apnea) 10/16/2017  . Other B-complex deficiencies   . Otitis media, acute    left  . Peripheral neuropathy   . Spinal stenosis of lumbar region   . Tubulovillous adenoma of colon   . Unspecified essential hypertension     Past Surgical History:  Procedure Laterality Date  . CARDIAC CATHETERIZATION  2015, 2009,  2006  . CERVICAL SPINE SURGERY  2003  . CORONARY ANGIOPLASTY WITH STENT PLACEMENT  2002, 2015  . EYE SURGERY     cataracts bilaterally  . POSTERIOR CERVICAL LAMINECTOMY N/A 10/23/2016   Procedure: POSTERIOR CERVICAL LAMINECTOMY MULTI LEVEL CERVICAL TWO- CERVICAL THREE, CERVICAL THREE- CERVICAL FOUR;  Surgeon: Ashok Pall, MD;  Location: [facility];  Service: Neurosurgery;  Laterality: N/A;  POSTERIOR    There were no vitals filed for this visit.  Subjective Assessment - 12/14/18 1322    Subjective  Pt went to a SAIL class this AM, chair class. Just tight today, no pain.    Currently in Pain?  No/denies        [facility] Adult PT Treatment/Exercise - 12/14/18 0001      Pilates   Pilates Reformer  See note        Pilates Reformer used for LE/core strength, postural strength, lumbopelvic disassociation and core control.  Exercises included:  Footwork 2 Red 1 Blue  Parallel and turnout on heels x 20, toes x 20, heel raises x 10  Single leg x 10 each leg.  Feet in Straps 1 Red 1 yellow Arcs, circles and squats x 15 each with manual A for getting in and out of straps  Frog stretch for hip opening    PT Education - 12/14/18 1406    Education Details  verbal review HEP, has 2 weeks without PT  , Pilates    Person(s) Educated  Patient    Methods  Explanation    Comprehension  Verbalized understanding          PT Long Term Goals - 12/12/18 1404      PT LONG TERM GOAL #1   Title  Pt will be able to walk in the community as needed (with cart) and decreased Rt LE pain for shopping.     Baseline  met 50% of the time    Status  On-going      PT LONG TERM GOAL #2   Title  Pt will be able to report no falls for >2 consecutive weeks and improved confidence     Baseline  was met, fell yesterday    Status  On-going      PT LONG TERM GOAL #3   Title  Pt will be able to be I with HEP for cervical ROM, UE and LE strength    Status  On-going      PT LONG TERM GOAL #4   Title  Pt will  increase cervical AROM by 10 or more degrees in each direction, no lasting pain for improved driving, travel.     Status  On-going      PT LONG TERM GOAL #5   Title  Functional strength goals TUG to 14 sec or better with cane    Status  Unable to assess            Plan - 12/14/18 1330    Clinical Impression Statement  Patient motivated to take an online Zoom class this AM, was tired but no pain, really.  She modified successfully during the class.  She was able to exercise today without increased pain.  Plans to get a couple of private session at the Pilates studio and return to wrap up here.    Examination-Activity Limitations  Squat;Stairs;Lift;Bend;Locomotion Level;Stand;Transfers    PT Treatment/Interventions  ADLs/Self Care Home Management;DME Instruction;Balance training;Passive range of motion;Neuromuscular re-education;Gait training;Dry needling;Manual techniques;Taping;Functional mobility training;Moist Heat;Electrical Stimulation;Therapeutic exercise;Patient/family education;Therapeutic activities    PT Next Visit Plan  Reformer, DN, check TUG.  Has 2 weeks off then finish POC 9/28    PT Home Exercise Plan  chin tuck, bands in supine (flexion/h abd), neck stretches, piriformis , LTR with head turns    Consulted and Agree with Plan of Care  Patient       Patient will benefit from skilled therapeutic intervention in order to improve the following deficits and impairments:  Abnormal gait, Increased fascial restricitons, Impaired sensation, Pain, Improper body mechanics, Postural dysfunction, Increased muscle spasms, Decreased coordination, Decreased mobility, Decreased activity tolerance, Decreased endurance, Decreased range of motion, Decreased strength, Impaired UE functional use, Obesity, Impaired flexibility, Increased edema, Difficulty walking, Decreased balance  Visit Diagnosis: Cervicalgia  Other abnormalities of gait and mobility  Pain in right hip  Muscle weakness  (generalized)     Problem List Patient Active Problem List   Diagnosis Date Noted  . OSA (obstructive sleep apnea) 10/16/2017  . Cervical stenosis of spinal canal 10/23/2016  . Neck swelling 09/28/2011  . Weight gain 01/02/2011  . Carpal tunnel syndrome 11/02/2010  . Leg swelling  08/26/2010  . ADRENAL MASS, LEFT 05/23/2010  . SPINAL STENOSIS, LUMBAR 12/27/2009  . B12 DEFICIENCY 06/10/2009  . ANEMIA 06/10/2009  . BACK PAIN, LUMBAR 11/06/2008  . KNEE PAIN, LEFT 02/03/2008  . DIABETES MELLITUS, TYPE II, UNCONTROLLED 01/12/2008  . MENOPAUSE-RELATED VASOMOTOR SYMPTOMS, HOT FLASHES 06/23/2007  . BURSITIS, LEFT HIP 06/23/2007  . HYPERLIPIDEMIA 03/25/2007  . ANXIETY 03/25/2007  . DEPRESSION 03/25/2007  . PERIPHERAL NEUROPATHY 03/25/2007  . HYPERTENSION 03/25/2007  . CORONARY ARTERY DISEASE 03/25/2007  . DIVERTICULOSIS, COLON 03/25/2007  . TUBULOVILLOUS ADENOMA, COLON 02/18/2007    PAA,JENNIFER 12/14/2018, 4:56 PM  [facility] [address] [place], [name], [address] Phone: [phone]   Fax:  [phone]  Name: Judyth Demarais MRN: [id number] Date of Birth: Mar 28, 1951  Raeford Razor, PT 12/14/18 4:56 PM Phone: [phone] Fax: [phone]

## 2018-12-20 ENCOUNTER — Other Ambulatory Visit: Payer: Self-pay | Admitting: Family Medicine

## 2019-01-01 ENCOUNTER — Other Ambulatory Visit: Payer: Self-pay | Admitting: Family Medicine

## 2019-01-02 ENCOUNTER — Encounter: Payer: Self-pay | Admitting: Physical Therapy

## 2019-01-02 ENCOUNTER — Ambulatory Visit: Payer: Medicare Other | Admitting: Physical Therapy

## 2019-01-02 ENCOUNTER — Other Ambulatory Visit: Payer: Self-pay

## 2019-01-02 DIAGNOSIS — M542 Cervicalgia: Secondary | ICD-10-CM

## 2019-01-02 DIAGNOSIS — M6281 Muscle weakness (generalized): Secondary | ICD-10-CM

## 2019-01-02 DIAGNOSIS — R2689 Other abnormalities of gait and mobility: Secondary | ICD-10-CM

## 2019-01-02 DIAGNOSIS — M25551 Pain in right hip: Secondary | ICD-10-CM

## 2019-01-02 NOTE — Therapy (Signed)
[place], [name], [address] Phone: [phone]   Fax:  [phone]  Physical Therapy Treatment  Patient Details  Name: [name] Bean MRN: [id number] Date of Birth: 12/07/1950 Referring Provider (PT): Dr. Christella Noa    Encounter Date: 01/02/2019  PT End of Session - 01/02/19 1244    Visit Number  20    Number of Visits  21    Date for PT Re-Evaluation  01/09/19    Authorization Type  UHC Medicare, next progress note by visit 56, add kx at visit 15    PT Start Time  1238    PT Stop Time  1330    PT Time Calculation (min)  52 min    Activity Tolerance  Patient tolerated treatment well    Behavior During Therapy  [facility] for tasks assessed/performed       Past Medical History:  Diagnosis Date  . Anemia   . Anxiety   . Arthritis   . Asthma   . Broken arm    left arm  . Bronchitis   . Bursitis of left hip   . CAD (coronary artery disease)   . Candidiasis, vagina   . Cardiac arrest ([place]) 06/20/2016   at [facility]; ? due to flash pulm edema vs undertreated chronic CHF  . Cerumen impaction   . CHF (congestive heart failure) ([place])    RECENT ADMIT TO [facility]   . Colon, diverticulosis   . Depression   . Diabetes mellitus    15 YRS AGO  . Fatigue   . Gastroenteritis   . Hot flashes, menopausal   . Hyperlipidemia   . Knee pain, left   . Lumbar back pain   . Maxillary sinusitis    history  . Muscle tear    right gluteus  . Nausea   . OSA (obstructive sleep apnea) 10/16/2017  . Other B-complex deficiencies   . Otitis media, acute    left  . Peripheral neuropathy   . Spinal stenosis of lumbar region   . Tubulovillous adenoma of colon   . Unspecified essential hypertension     Past Surgical History:  Procedure Laterality Date  . CARDIAC CATHETERIZATION  2015, 2009, 2006  . CERVICAL SPINE SURGERY  2003  . CORONARY ANGIOPLASTY WITH STENT PLACEMENT  2002, 2015  . EYE SURGERY     cataracts  bilaterally  . POSTERIOR CERVICAL LAMINECTOMY N/A 10/23/2016   Procedure: POSTERIOR CERVICAL LAMINECTOMY MULTI LEVEL CERVICAL TWO- CERVICAL THREE, CERVICAL THREE- CERVICAL FOUR;  Surgeon: Ashok Pall, MD;  Location: [facility];  Service: Neurosurgery;  Laterality: N/A;  POSTERIOR    There were no vitals filed for this visit.  Subjective Assessment - 01/02/19 1240    Subjective  Stressed out.  Sister is having the same surgery as I did ([place]).  Tight in Rt hip.  Had a private session and it was good.    Currently in Pain?  Yes    Pain Score  6     Pain Location  Hip    Pain Orientation  Right    Pain Descriptors / Indicators  Tightness    Pain Type  Chronic pain    Pain Onset  More than a month ago    Pain Frequency  Intermittent    Aggravating Factors   stand, walk    Pain Relieving Factors  PT, Dry needling , stretching    Pain Score  0    Pain Location  Neck  [facility] Adult PT Treatment/Exercise - 01/02/19 0001      Knee/Hip Exercises: Stretches   Active Hamstring Stretch  Both;3 reps;30 seconds    ITB Stretch  Both;3 reps;30 seconds    Piriformis Stretch  Right;3 reps;30 seconds      Moist Heat Therapy   Number Minutes Moist Heat  10 Minutes    Moist Heat Location  Hip      Manual Therapy   Soft tissue mobilization  IASTM Rt lateral hip, gluteals    Myofascial Release  Rt trunk     Passive ROM  Hip ER and IR     Manual Traction  Rt hip LAD x 5         PT Education - 01/02/19 1326    Education Details  dry needling    Person(s) Educated  Patient    Methods  Explanation    Comprehension  Verbalized understanding;Returned demonstration          PT Long Term Goals - 12/12/18 1404      PT LONG TERM GOAL #1   Title  Pt will be able to walk in the community as needed (with cart) and decreased Rt LE pain for shopping.     Baseline  met 50% of the time    Status  On-going      PT LONG TERM GOAL #2   Title  Pt will be able to report no falls for >2  consecutive weeks and improved confidence     Baseline  was met, fell yesterday    Status  On-going      PT LONG TERM GOAL #3   Title  Pt will be able to be I with HEP for cervical ROM, UE and LE strength    Status  On-going      PT LONG TERM GOAL #4   Title  Pt will increase cervical AROM by 10 or more degrees in each direction, no lasting pain for improved driving, travel.     Status  On-going      PT LONG TERM GOAL #5   Title  Functional strength goals TUG to 14 sec or better with cane    Status  Unable to assess            Plan - 01/02/19 1606    Clinical Impression Statement  Patient transitioning to community Pilates sessions.  She had increased R hip pain today, increased tone when changing from supine to sit today. May consider cash based dry needling in the future.  1 more visit.    PT Treatment/Interventions  ADLs/Self Care Home Management;DME Instruction;Balance training;Passive range of motion;Neuromuscular re-education;Gait training;Dry needling;Manual techniques;Taping;Functional mobility training;Moist Heat;Electrical Stimulation;Therapeutic exercise;Patient/family education;Therapeutic activities    PT Next Visit Plan  Reformer, DN, check TUG.  Has 2 weeks off then finish POC 9/28    PT Home Exercise Plan  chin tuck, bands in supine (flexion/h abd), neck stretches, piriformis , LTR with head turns       Patient will benefit from skilled therapeutic intervention in order to improve the following deficits and impairments:  Abnormal gait, Increased fascial restricitons, Impaired sensation, Pain, Improper body mechanics, Postural dysfunction, Increased muscle spasms, Decreased coordination, Decreased mobility, Decreased activity tolerance, Decreased endurance, Decreased range of motion, Decreased strength, Impaired UE functional use, Obesity, Impaired flexibility, Increased edema, Difficulty walking, Decreased balance  Visit Diagnosis: Cervicalgia  Other abnormalities  of gait and mobility  Pain in right hip  Muscle weakness (generalized)  Problem List Patient Active Problem List   Diagnosis Date Noted  . OSA (obstructive sleep apnea) 10/16/2017  . Cervical stenosis of spinal canal 10/23/2016  . Neck swelling 09/28/2011  . Weight gain 01/02/2011  . Carpal tunnel syndrome 11/02/2010  . Leg swelling 08/26/2010  . ADRENAL MASS, LEFT 05/23/2010  . SPINAL STENOSIS, LUMBAR 12/27/2009  . B12 DEFICIENCY 06/10/2009  . ANEMIA 06/10/2009  . BACK PAIN, LUMBAR 11/06/2008  . KNEE PAIN, LEFT 02/03/2008  . DIABETES MELLITUS, TYPE II, UNCONTROLLED 01/12/2008  . MENOPAUSE-RELATED VASOMOTOR SYMPTOMS, HOT FLASHES 06/23/2007  . BURSITIS, LEFT HIP 06/23/2007  . HYPERLIPIDEMIA 03/25/2007  . ANXIETY 03/25/2007  . DEPRESSION 03/25/2007  . PERIPHERAL NEUROPATHY 03/25/2007  . HYPERTENSION 03/25/2007  . CORONARY ARTERY DISEASE 03/25/2007  . DIVERTICULOSIS, COLON 03/25/2007  . TUBULOVILLOUS ADENOMA, COLON 02/18/2007    PAA,JENNIFER 01/02/2019, 4:29 PM  [facility] [address] [place], [name], [address] Phone: [phone]   Fax:  [phone]  Name: Asjia Berrios MRN: [id number] Date of Birth: Sep 30, 1950  Raeford Razor, PT 01/02/19 4:29 PM Phone: [phone] Fax: [phone]

## 2019-01-06 ENCOUNTER — Other Ambulatory Visit: Payer: Self-pay | Admitting: Family Medicine

## 2019-01-09 ENCOUNTER — Other Ambulatory Visit: Payer: Self-pay

## 2019-01-09 ENCOUNTER — Encounter: Payer: Self-pay | Admitting: Physical Therapy

## 2019-01-09 ENCOUNTER — Other Ambulatory Visit: Payer: Self-pay | Admitting: Family Medicine

## 2019-01-09 ENCOUNTER — Telehealth: Payer: Self-pay | Admitting: Family Medicine

## 2019-01-09 ENCOUNTER — Ambulatory Visit: Payer: Medicare Other | Admitting: Physical Therapy

## 2019-01-09 DIAGNOSIS — M6281 Muscle weakness (generalized): Secondary | ICD-10-CM

## 2019-01-09 DIAGNOSIS — M542 Cervicalgia: Secondary | ICD-10-CM | POA: Diagnosis not present

## 2019-01-09 DIAGNOSIS — R2689 Other abnormalities of gait and mobility: Secondary | ICD-10-CM

## 2019-01-09 DIAGNOSIS — M25551 Pain in right hip: Secondary | ICD-10-CM

## 2019-01-09 DIAGNOSIS — F419 Anxiety disorder, unspecified: Secondary | ICD-10-CM

## 2019-01-09 MED ORDER — LORAZEPAM 0.5 MG PO TABS: ORAL_TABLET | ORAL | 0 refills | Status: AC

## 2019-01-09 NOTE — Telephone Encounter (Signed)
Pt states that she is taking Penicillin and she needs an Rx For Diflucan sent to her pharmacy, pt is also having a dental procedure coming up and request for refill on Lorazepam. Please advise

## 2019-01-09 NOTE — Therapy (Signed)
[place], [name], [address] Phone: [phone]   Fax:  [phone]  Physical Therapy Treatment/Discharge  Patient Details  Name: [name] Bean MRN: [id number] Date of Birth: [birth year] Referring Provider (PT): Dr. Christella Noa    Encounter Date: 01/09/2019  PT End of Session - 01/09/19 1305    Visit Number  21    Number of Visits  21    Date for PT Re-Evaluation  01/09/19    Authorization Type  [employer] Medicare, next progress note by visit 10, add kx at visit 15    PT Start Time  1240    PT Stop Time  1318    PT Time Calculation (min)  38 min    Activity Tolerance  Patient tolerated treatment well    Behavior During Therapy  [facility] for tasks assessed/performed       Past Medical History:  Diagnosis Date  . Anemia   . Anxiety   . Arthritis   . Asthma   . Broken arm    left arm  . Bronchitis   . Bursitis of left hip   . CAD (coronary artery disease)   . Candidiasis, vagina   . Cardiac arrest ([place]) 06/20/2016   at [facility]; ? due to flash pulm edema vs undertreated chronic CHF  . Cerumen impaction   . CHF (congestive heart failure) ([place])    RECENT ADMIT TO [facility]   . Colon, diverticulosis   . Depression   . Diabetes mellitus    15 YRS AGO  . Fatigue   . Gastroenteritis   . Hot flashes, menopausal   . Hyperlipidemia   . Knee pain, left   . Lumbar back pain   . Maxillary sinusitis    history  . Muscle tear    right gluteus  . Nausea   . OSA (obstructive sleep apnea) 10/16/2017  . Other B-complex deficiencies   . Otitis media, acute    left  . Peripheral neuropathy   . Spinal stenosis of lumbar region   . Tubulovillous adenoma of colon   . Unspecified essential hypertension     Past Surgical History:  Procedure Laterality Date  . CARDIAC CATHETERIZATION  2015, 2009, 2006  . CERVICAL SPINE SURGERY  2003  . CORONARY ANGIOPLASTY WITH STENT PLACEMENT  2002, 2015  . EYE SURGERY      cataracts bilaterally  . POSTERIOR CERVICAL LAMINECTOMY N/A 10/23/2016   Procedure: POSTERIOR CERVICAL LAMINECTOMY MULTI LEVEL CERVICAL TWO- CERVICAL THREE, CERVICAL THREE- CERVICAL FOUR;  Surgeon: Ashok Pall, MD;  Location: [place];  Service: Neurosurgery;  Laterality: N/A;  POSTERIOR    There were no vitals filed for this visit.  Subjective Assessment - 01/09/19 1243    Subjective  no neck pain (usually at night) Hip pain is there 2/10 not as bad as last week.  Had to skip Pilates this week due to headache.  Has been less active on her Zoom classes this week.  I have been stretching in the bed before I get out.  Legs really shaky and I dont know why, I have to call the Dr.    Pertinent History  CAD, cervical fusion and calcification of ligament, ataxia, CHF, HTN, DM         [facility] PT Assessment - 01/09/19 0001      AROM   Cervical Flexion  25    Cervical Extension  20    Cervical - Right Side Bend  20  Cervical - Left Side Bend  20    Cervical - Right Rotation  40    Cervical - Left Rotation  45        [facility] Adult PT Treatment/Exercise - 01/09/19 0001      Transfers   Comments  TUG x 3 with straight cane      Timed Up and Go Test   Normal TUG (seconds)  13   3 trials, 18 sec, 13sec then 14 sec      Neck Exercises: Supine   Other Supine Exercise  overhead lift x 10 with 5 lbs    Other Supine Exercise  fly x 10, 2 lbs and alternating flexion/ext  with 2 lbs x 10       Lumbar Exercises: Supine   Bent Knee Raise  10 reps    Bent Knee Raise Limitations  holding weight 5 lbs     Basic Lumbar Stabilization Limitations  with 5 lbs bent knee raise x 10       Knee/Hip Exercises: Stretches   Active Hamstring Stretch  Both;3 reps;30 seconds    Piriformis Stretch Limitations  knees crossed with LTR x 5 each side     Other Knee/Hip Stretches  knees wide x 10 for hip IR/ER              PT Education - 01/09/19 1340    Education Details  options for core and stretching  hips , TUG results    Person(s) Educated  Patient    Methods  Explanation    Comprehension  Verbalized understanding;Returned demonstration          PT Long Term Goals - 01/09/19 1245      PT LONG TERM GOAL #1   Title  Pt will be able to walk in the community as needed (with cart) and decreased Rt LE pain for shopping.     Baseline  can do this especially after PT, limited mostly by ataxia, tone in LEs    Status  Achieved      PT LONG TERM GOAL #2   Title  Pt will be able to report no falls for >2 consecutive weeks and improved confidence     Baseline  improved but still a fall risk    Status  Achieved      PT LONG TERM GOAL #3   Title  Pt will be able to be I with HEP for cervical ROM, UE and LE strength    Status  Achieved      PT LONG TERM GOAL #4   Title  Pt will increase cervical AROM by 10 or more degrees in each direction, no lasting pain for improved driving, travel.     Status  Partially Met      PT LONG TERM GOAL #5   Title  Functional strength goals TUG to 14 sec or better with cane    Baseline  18 sec, 13, sec 14 sec    Status  Partially Met            Plan - 01/09/19 1302    Clinical Impression Statement  Patient has met several goals.  She has not imptroved her cervical ROM >10 deg but is functionally where she is going to be I think.  HEr Rt. hip benefits from strethcing and dry needling which can now be done at a different location (she exceeded dry needling sessions for this episode) . She is transitioning to Pilates which I think  will be very beneficial to her. She is discharged from PT at this time.    PT Treatment/Interventions  ADLs/Self Care Home Management;DME Instruction;Balance training;Passive range of motion;Neuromuscular re-education;Gait training;Dry needling;Manual techniques;Taping;Functional mobility training;Moist Heat;Electrical Stimulation;Therapeutic exercise;Patient/family education;Therapeutic activities    PT Next Visit Plan  NA, DC     PT Home Exercise Plan  chin tuck, bands in supine (flexion/h abd), neck stretches, piriformis , LTR with head turns, wide knees ER/IR for hips    Consulted and Agree with Plan of Care  Patient       Patient will benefit from skilled therapeutic intervention in order to improve the following deficits and impairments:     Visit Diagnosis: Cervicalgia  Other abnormalities of gait and mobility  Pain in right hip  Muscle weakness (generalized)     Problem List Patient Active Problem List   Diagnosis Date Noted  . OSA (obstructive sleep apnea) 10/16/2017  . Cervical stenosis of spinal canal 10/23/2016  . Neck swelling 09/28/2011  . Weight gain 01/02/2011  . Carpal tunnel syndrome 11/02/2010  . Leg swelling 08/26/2010  . ADRENAL MASS, LEFT 05/23/2010  . SPINAL STENOSIS, LUMBAR 12/27/2009  . B12 DEFICIENCY 06/10/2009  . ANEMIA 06/10/2009  . BACK PAIN, LUMBAR 11/06/2008  . KNEE PAIN, LEFT 02/03/2008  . DIABETES MELLITUS, TYPE II, UNCONTROLLED 01/12/2008  . MENOPAUSE-RELATED VASOMOTOR SYMPTOMS, HOT FLASHES 06/23/2007  . BURSITIS, LEFT HIP 06/23/2007  . HYPERLIPIDEMIA 03/25/2007  . ANXIETY 03/25/2007  . DEPRESSION 03/25/2007  . PERIPHERAL NEUROPATHY 03/25/2007  . HYPERTENSION 03/25/2007  . CORONARY ARTERY DISEASE 03/25/2007  . DIVERTICULOSIS, COLON 03/25/2007  . TUBULOVILLOUS ADENOMA, COLON 02/18/2007    Karianna Gusman 01/09/2019, 1:51 PM  [facility] [address] [place], [name], [address] Phone: [phone]   Fax:  [phone]  Name: [name] Bean MRN: [id number] Date of Birth: 1950-09-01  PHYSICAL THERAPY DISCHARGE SUMMARY  Visits from Start of Care: 21  Current functional level related to goals / functional outcomes: See above, limited in safety with gait , balance and coordination. Pain is minimal.    Remaining deficits: See above    Education / Equipment: PIlates, HEP, dry needling, balance   Plan: Patient agrees to discharge.  Patient goals were not met. Patient is being discharged due to being pleased with the current functional level.  ?????    Raeford Razor, PT 01/09/19 1:53 PM Phone: [phone] Fax: [phone]

## 2019-01-09 NOTE — Telephone Encounter (Unsigned)
Copied from [place] #[id number]. Topic: General - Other >> Jan 09, 2019 10:27 AM Yvette Rack wrote: Reason for CRM: Pt stated she needs to speak with a nurse to discuss refilling a prescription of a medication that she can not remember the name of. Pt requests call back.

## 2019-01-09 NOTE — Telephone Encounter (Signed)
Left a vm for patient to callback regarding her medications

## 2019-01-25 ENCOUNTER — Other Ambulatory Visit: Payer: Self-pay | Admitting: Family Medicine

## 2019-02-07 ENCOUNTER — Other Ambulatory Visit: Payer: Self-pay | Admitting: Family Medicine

## 2019-02-07 ENCOUNTER — Other Ambulatory Visit (HOSPITAL_COMMUNITY): Payer: Self-pay

## 2019-02-07 MED ORDER — CARVEDILOL 25 MG PO TABS: 25.0000 mg | ORAL_TABLET | Freq: Two times a day (BID) | ORAL | 6 refills | Status: DC

## 2019-02-21 ENCOUNTER — Other Ambulatory Visit: Payer: Self-pay | Admitting: Neurosurgery

## 2019-02-21 ENCOUNTER — Other Ambulatory Visit: Payer: Self-pay | Admitting: Family Medicine

## 2019-02-21 DIAGNOSIS — M4802 Spinal stenosis, cervical region: Secondary | ICD-10-CM

## 2019-03-17 ENCOUNTER — Telehealth: Payer: Self-pay | Admitting: Family Medicine

## 2019-03-17 NOTE — Telephone Encounter (Signed)
Spoke with pt verbalized understanding that pt over the age of 77 yrs receive immunization from their pharmacy.

## 2019-03-17 NOTE — Telephone Encounter (Signed)
Copied from [place] [id number]. Topic: General - Inquiry >> Mar 17, 2019  8:22 AM [name] Bean, NT wrote: Reason for CRM: Pt called in stating she would like to have the shingrix vaccination. Please advise.

## 2019-03-18 ENCOUNTER — Other Ambulatory Visit: Payer: Self-pay

## 2019-03-18 ENCOUNTER — Ambulatory Visit
Admission: RE | Admit: 2019-03-18 | Discharge: 2019-03-18 | Disposition: A | Discharge status: Home / Self Care | Payer: Medicare Other | Source: Ambulatory Visit | Attending: Neurosurgery | Admitting: Neurosurgery

## 2019-03-18 DIAGNOSIS — M4802 Spinal stenosis, cervical region: Secondary | ICD-10-CM

## 2019-03-21 ENCOUNTER — Other Ambulatory Visit: Payer: Self-pay

## 2019-03-21 ENCOUNTER — Ambulatory Visit (INDEPENDENT_AMBULATORY_CARE_PROVIDER_SITE_OTHER): Payer: Medicare Other

## 2019-03-21 DIAGNOSIS — Z23 Encounter for immunization: Secondary | ICD-10-CM

## 2019-03-29 ENCOUNTER — Other Ambulatory Visit: Payer: Self-pay | Admitting: Family Medicine

## 2019-03-29 DIAGNOSIS — I509 Heart failure, unspecified: Secondary | ICD-10-CM

## 2019-04-01 ENCOUNTER — Other Ambulatory Visit: Payer: Self-pay | Admitting: Family Medicine

## 2019-04-01 ENCOUNTER — Other Ambulatory Visit (HOSPITAL_COMMUNITY): Payer: Self-pay | Admitting: Internal Medicine

## 2019-04-03 ENCOUNTER — Other Ambulatory Visit: Payer: Self-pay

## 2019-04-03 ENCOUNTER — Telehealth (HOSPITAL_COMMUNITY): Payer: Self-pay

## 2019-04-03 NOTE — Telephone Encounter (Signed)
Received a request for Medical Records from [facility]. Faxed to [phone].

## 2019-04-17 ENCOUNTER — Other Ambulatory Visit: Payer: Self-pay

## 2019-04-17 ENCOUNTER — Telehealth: Payer: Self-pay | Admitting: Family Medicine

## 2019-04-17 MED ORDER — CHANTIX STARTING MONTH PAK 0.5 MG X 11 & 1 MG X 42 PO TABS: ORAL_TABLET | ORAL | 0 refills | Status: DC

## 2019-04-17 NOTE — Telephone Encounter (Signed)
Rx sent to pt pharmacy 

## 2019-04-17 NOTE — Telephone Encounter (Signed)
Medication Refill - Medication: Chantix starter package  Has the patient contacted their pharmacy? Yes.   (Agent: If no, request that the patient contact the pharmacy for the refill.) (Agent: If yes, when and what did the pharmacy advise?)  Preferred Pharmacy (with phone number or street name[id number] [place], [place]. [facility] [address]  Agent: Please be advised that RX refills may take up to 3 business days. We ask that you follow-up with your pharmacy.

## 2019-05-04 DIAGNOSIS — M7061 Trochanteric bursitis, right hip: Secondary | ICD-10-CM | POA: Diagnosis not present

## 2019-05-17 ENCOUNTER — Telehealth: Payer: Self-pay | Admitting: Family Medicine

## 2019-05-17 NOTE — Telephone Encounter (Signed)
Dr. Iantha Fallen office needs to know if this pt is taking blood thinners and if she can she discontinue for 3 teeth to be pulled.  Alyse Low can be reach at [phone]  Needs to know asap bc pt is in office waiting

## 2019-05-17 NOTE — Telephone Encounter (Signed)
Called pt dentist office spoke with Alyse Low, advised to fax pt Medical Clearance form to our office for completion for  pt teeth extraction procedure. Alyse Low state that pt appointment was rescheduled. Awaiting on the form to be received

## 2019-05-19 NOTE — Telephone Encounter (Signed)
Clearance form completed and faxed to the dentist office, spoke with pt aware to hold taking Aspirin 3 days prior to her procedure

## 2019-05-24 ENCOUNTER — Other Ambulatory Visit: Payer: Self-pay

## 2019-05-24 ENCOUNTER — Other Ambulatory Visit: Payer: Self-pay | Admitting: Family Medicine

## 2019-05-24 ENCOUNTER — Other Ambulatory Visit (HOSPITAL_COMMUNITY): Payer: Self-pay | Admitting: Internal Medicine

## 2019-05-24 DIAGNOSIS — E119 Type 2 diabetes mellitus without complications: Secondary | ICD-10-CM

## 2019-05-24 DIAGNOSIS — Z794 Long term (current) use of insulin: Secondary | ICD-10-CM

## 2019-05-24 MED ORDER — INSULIN ASPART 100 UNIT/ML FLEXPEN: PEN_INJECTOR | SUBCUTANEOUS | 2 refills | Status: AC

## 2019-05-24 NOTE — Telephone Encounter (Signed)
Ok to change Humalog back into [employer]

## 2019-05-24 NOTE — Telephone Encounter (Signed)
Please contact patient,needs an appointment hasnt been seen since 2019

## 2019-05-24 NOTE — Telephone Encounter (Signed)
Rx sent 

## 2019-05-24 NOTE — Telephone Encounter (Signed)
Need to change Humalog 100 qwik pen to Novolog Pen   [id number] [place], [place] [address] Phone:  [phone]  Fax:  [phone]      The patients insurance has changed and needs to change her insulin.

## 2019-05-24 NOTE — Telephone Encounter (Signed)
That is fine 

## 2019-05-25 ENCOUNTER — Other Ambulatory Visit: Payer: Self-pay | Admitting: Family Medicine

## 2019-05-25 ENCOUNTER — Telehealth: Payer: Self-pay | Admitting: Family Medicine

## 2019-05-25 NOTE — Telephone Encounter (Signed)
Patient has a question about getting the Covid vaccine.

## 2019-06-01 NOTE — Telephone Encounter (Signed)
Called pt left a detailed message for pt with Dr Volanda Napoleon advice on getting the COVID-19 vaccine, advised pt to call the office with any question

## 2019-06-02 NOTE — Telephone Encounter (Signed)
Spoke with [name] Bean gave Dr Volanda Napoleon advise to get the vaccine if [name] Bean does not have any known allergies to any ingredients in the COVID-19 vaccine, [name] Bean verbalized understanding

## 2019-06-08 ENCOUNTER — Other Ambulatory Visit (HOSPITAL_COMMUNITY): Payer: Self-pay

## 2019-06-23 ENCOUNTER — Encounter (HOSPITAL_COMMUNITY): Payer: Medicare Other | Admitting: Internal Medicine

## 2019-06-30 ENCOUNTER — Telehealth: Payer: Self-pay | Admitting: Family Medicine

## 2019-06-30 ENCOUNTER — Other Ambulatory Visit: Payer: Self-pay

## 2019-06-30 NOTE — Telephone Encounter (Signed)
Please advise 

## 2019-06-30 NOTE — Telephone Encounter (Signed)
I have not seen her since 2018. I cannot refill and I no longer prescribe chantix. Please have her discuss with Pcp

## 2019-06-30 NOTE — Telephone Encounter (Signed)
Medication refill: Chantix -continuing month?  [facility] [place]: [phone]

## 2019-07-03 ENCOUNTER — Other Ambulatory Visit (HOSPITAL_COMMUNITY): Payer: Self-pay | Admitting: Internal Medicine

## 2019-07-03 ENCOUNTER — Other Ambulatory Visit: Payer: Self-pay | Admitting: Family Medicine

## 2019-07-03 ENCOUNTER — Telehealth (INDEPENDENT_AMBULATORY_CARE_PROVIDER_SITE_OTHER): Payer: Medicare PPO | Admitting: Family Medicine

## 2019-07-03 ENCOUNTER — Encounter: Payer: Self-pay | Admitting: Family Medicine

## 2019-07-03 DIAGNOSIS — G5601 Carpal tunnel syndrome, right upper limb: Secondary | ICD-10-CM

## 2019-07-03 DIAGNOSIS — F17200 Nicotine dependence, unspecified, uncomplicated: Secondary | ICD-10-CM

## 2019-07-03 DIAGNOSIS — F329 Major depressive disorder, single episode, unspecified: Secondary | ICD-10-CM | POA: Diagnosis not present

## 2019-07-03 DIAGNOSIS — F419 Anxiety disorder, unspecified: Secondary | ICD-10-CM

## 2019-07-03 DIAGNOSIS — F17210 Nicotine dependence, cigarettes, uncomplicated: Secondary | ICD-10-CM

## 2019-07-03 MED ORDER — VARENICLINE TARTRATE 1 MG PO TABS: 1.0000 mg | ORAL_TABLET | Freq: Two times a day (BID) | ORAL | 4 refills | Status: AC

## 2019-07-03 NOTE — Telephone Encounter (Signed)
Pt seen today via virtual visit by Dr Volanda Napoleon. Refills done

## 2019-07-03 NOTE — Telephone Encounter (Signed)
FYI Pt is scheduled for a virtual visit this afternoon for f/u on Sertraline refill and Chantix. Rx for Losartan sent to pt pharmacy.

## 2019-07-03 NOTE — Progress Notes (Signed)
Virtual Visit via Video Note  I connected with [name] Bean on 07/03/19 at  2:30 PM EDT by a video enabled telemedicine application 2/2 [id number] pandemic and verified that I am speaking with the correct person using two identifiers.  Location patient: home Location provider:work or home office Persons participating in the virtual visit: patient, provider  I discussed the limitations of evaluation and management by telemedicine and the availability of in person appointments. The patient expressed understanding and agreed to proceed.   HPI: Pt states she has been doing ok.  Trying to quit smoking.  Down to 3 cigarettes per day.  Pt started using chantix starter pack she found around the house.  Now on Chantix 1 mg BID, but needs refill.  Pt states she has to flush the cigarette butts as in the past when trying to quit cold [place] she would look for butts in ash trays to smoke.  Pt states the sertraline helps her stay calm.  Needs refills.  Endorses good sleep and energy.  Has f/u with Cardiology in a few wks.  Trying to walk daily.  Pt working on being  Healthier by the summer.  Pt also had injections in b/l bursa of hips.  Also had carpal tunnel surgery on R wrist.   ROS: See pertinent positives and negatives per HPI.  Past Medical History:  Diagnosis Date  . Anemia   . Anxiety   . Arthritis   . Asthma   . Broken arm    left arm  . Bronchitis   . Bursitis of left hip   . CAD (coronary artery disease)   . Candidiasis, vagina   . Cardiac arrest ([place]) 06/20/2016   at [facility]; ? due to flash pulm edema vs undertreated chronic CHF  . Cerumen impaction   . CHF (congestive heart failure) ([place])    RECENT ADMIT TO [facility]   . Colon, diverticulosis   . Depression   . Diabetes mellitus    15 YRS AGO  . Fatigue   . Gastroenteritis   . Hot flashes, menopausal   . Hyperlipidemia   . Knee pain, left   . Lumbar back pain   . Maxillary sinusitis    history  . Muscle tear    right gluteus  .  Nausea   . OSA (obstructive sleep apnea) 10/16/2017  . Other B-complex deficiencies   . Otitis media, acute    left  . Peripheral neuropathy   . Spinal stenosis of lumbar region   . Tubulovillous adenoma of colon   . Unspecified essential hypertension     Past Surgical History:  Procedure Laterality Date  . CARDIAC CATHETERIZATION  2015, 2009, 2006  . CERVICAL SPINE SURGERY  2003  . CORONARY ANGIOPLASTY WITH STENT PLACEMENT  2002, 2015  . EYE SURGERY     cataracts bilaterally  . POSTERIOR CERVICAL LAMINECTOMY N/A 10/23/2016   Procedure: POSTERIOR CERVICAL LAMINECTOMY MULTI LEVEL CERVICAL TWO- CERVICAL THREE, CERVICAL THREE- CERVICAL FOUR;  Surgeon: Ashok Pall, MD;  Location: [place];  Service: Neurosurgery;  Laterality: N/A;  POSTERIOR    Family History  Problem Relation Age of Onset  . Pneumonia Father        deceased  . Alcohol abuse Father   . Hypertension Mother   . Diabetes Mother      Current Outpatient Medications:  .  aspirin EC 81 MG tablet, Take 81 mg by mouth daily. , Disp: , Rfl:  .  BD PEN NEEDLE NANO U/F 32G  X 4 MM MISC, USE TO INJECT INSULIN 5 TIMES A DAY, Disp: 100 each, Rfl: 5 .  bimatoprost (LUMIGAN) 0.01 % SOLN, Lumigan 0.01 % eye drops  PUT 1 DROP INTO BOTH EYES AT BEDTIME, Disp: , Rfl:  .  Blood Glucose Monitoring Suppl (ACCU-CHEK COMPACT CARE KIT) KIT, Accu-Chek Compact Plus Care kit  USE AS INSTRUCTED., Disp: , Rfl:  .  carvedilol (COREG) 25 MG tablet, Take 1 tablet (25 mg total) by mouth 2 (two) times daily with a meal. Needs an appointment for further refills, Disp: 60 tablet, Rfl: 0 .  fluconazole (DIFLUCAN) 150 MG tablet, TAKE 1 TABLET BY MOUTH AS ONE DOSE, Disp: 1 tablet, Rfl: 0 .  glucose blood (FREESTYLE TEST STRIPS) test strip, 1 each by Other route daily. (accu-chek guide) DX E11.9, Disp: 200 each, Rfl: 12 .  hydrALAZINE (APRESOLINE) 50 MG tablet, TAKE 1 AND 1/2 TABLETS BY MOUTH EVERY 12 HOURS, Disp: 270 tablet, Rfl: 1 .  insulin aspart (NOVOLOG)  100 UNIT/ML FlexPen, Inject 12 units 3 times daily with meals, Disp: 33 mL, Rfl: 2 .  Insulin Glargine (LANTUS SOLOSTAR) 100 UNIT/ML Solostar Pen, Inject 38 Units into the skin at bedtime., Disp: 15 mL, Rfl: 9 .  latanoprost (XALATAN) 0.005 % ophthalmic solution, Place 1 drop into both eyes at bedtime., Disp: , Rfl:  .  LORazepam (ATIVAN) 0.5 MG tablet, Take one tab 30 mins prior to dental procedure., Disp: 5 tablet, Rfl: 0 .  losartan (COZAAR) 100 MG tablet, TAKE 1 TABLET BY MOUTH EVERY DAY, Disp: 90 tablet, Rfl: 0 .  rosuvastatin (CRESTOR) 20 MG tablet, TAKE 1 TABLET BY MOUTH EVERY DAY, Disp: 90 tablet, Rfl: 0 .  sertraline (ZOLOFT) 50 MG tablet, TAKE 1 TABLET BY MOUTH EVERY DAY, Disp: 90 tablet, Rfl: 1 .  spironolactone (ALDACTONE) 25 MG tablet, Take 1 tablet (25 mg total) by mouth daily., Disp: 90 tablet, Rfl: 3 .  tiZANidine (ZANAFLEX) 2 MG tablet, Take by mouth every 6 (six) hours as needed for muscle spasms., Disp: , Rfl:  .  torsemide (DEMADEX) 20 MG tablet, TAKE 1 TO 2 TABLETS DAILY AS NEEDED FOR SWELLING, Disp: 180 tablet, Rfl: 1 .  varenicline (CHANTIX CONTINUING MONTH PAK) 1 MG tablet, Take 1 tablet (1 mg total) by mouth 2 (two) times daily., Disp: 60 tablet, Rfl: 6 .  varenicline (CHANTIX STARTING MONTH PAK) 0.5 MG X 11 & 1 MG X 42 tablet, TAKE 0.'5MG'$  TAB DAILY FOR 3 DAYS, 0.'5MG'$  TAB TWICE DAILY FOR 4 DAYS, THEN '1MG'$  TAB TWICE DAILY, Disp: 53 each, Rfl: 0  EXAM:  VITALS per patient if applicable:  RR between 12-20 bpm  GENERAL: alert, oriented, appears well and in no acute distress  HEENT: atraumatic, conjunctiva clear, no obvious abnormalities on inspection of external nose and ears  NECK: normal movements of the head and neck  LUNGS: on inspection no signs of respiratory distress, breathing rate appears normal, no obvious gross SOB, gasping or wheezing  CV: no obvious cyanosis  MS: moves all visible extremities without noticeable abnormality  PSYCH/NEURO: pleasant and  cooperative, no obvious depression or anxiety, speech and thought processing grossly intact  ASSESSMENT AND PLAN:  Discussed the following assessment and plan:  Tobacco use disorder -smoking cessation counseling >3 min, <10 min -continue chantix -discussed ways to successfully quit such as changing habits. -consider 1-800 QUIT NOW -will continue to evaluate -Plan: varenicline (CHANTIX CONTINUING MONTH PAK) 1 MG tablet  Carpal tunnel syndrome of right wrist -s/p release  surgery  Anxiety and depression -stable -continue sertraline 50 mg daily -continue to evaluate  F/u in the next 1 month in clinic for DM and labs.  Needs foot exam, eye exam, Hgb A1C   I discussed the assessment and treatment plan with the patient. The patient was provided an opportunity to ask questions and all were answered. The patient agreed with the plan and demonstrated an understanding of the instructions.   The patient was advised to call back or seek an in-person evaluation if the symptoms worsen or if the condition fails to improve as anticipated.   Billie Ruddy, MD

## 2019-07-04 DIAGNOSIS — G4733 Obstructive sleep apnea (adult) (pediatric): Secondary | ICD-10-CM | POA: Diagnosis not present

## 2019-07-20 DIAGNOSIS — M7061 Trochanteric bursitis, right hip: Secondary | ICD-10-CM | POA: Diagnosis not present

## 2019-07-31 ENCOUNTER — Other Ambulatory Visit: Payer: Self-pay

## 2019-07-31 ENCOUNTER — Ambulatory Visit (HOSPITAL_COMMUNITY)
Admission: RE | Admit: 2019-07-31 | Discharge: 2019-07-31 | Disposition: A | Discharge status: Home / Self Care | Payer: Medicare PPO | Source: Ambulatory Visit | Attending: Internal Medicine | Admitting: Internal Medicine

## 2019-07-31 ENCOUNTER — Encounter (HOSPITAL_COMMUNITY): Payer: Self-pay | Admitting: Internal Medicine

## 2019-07-31 ENCOUNTER — Telehealth: Payer: Self-pay | Admitting: *Deleted

## 2019-07-31 VITALS — BP 140/80 | HR 84 | Wt 193.6 lb

## 2019-07-31 DIAGNOSIS — Z8249 Family history of ischemic heart disease and other diseases of the circulatory system: Secondary | ICD-10-CM | POA: Insufficient documentation

## 2019-07-31 DIAGNOSIS — Z8674 Personal history of sudden cardiac arrest: Secondary | ICD-10-CM | POA: Insufficient documentation

## 2019-07-31 DIAGNOSIS — Z794 Long term (current) use of insulin: Secondary | ICD-10-CM | POA: Insufficient documentation

## 2019-07-31 DIAGNOSIS — J45909 Unspecified asthma, uncomplicated: Secondary | ICD-10-CM | POA: Diagnosis not present

## 2019-07-31 DIAGNOSIS — M25551 Pain in right hip: Secondary | ICD-10-CM | POA: Diagnosis not present

## 2019-07-31 DIAGNOSIS — F419 Anxiety disorder, unspecified: Secondary | ICD-10-CM | POA: Insufficient documentation

## 2019-07-31 DIAGNOSIS — Z79899 Other long term (current) drug therapy: Secondary | ICD-10-CM | POA: Diagnosis not present

## 2019-07-31 DIAGNOSIS — E119 Type 2 diabetes mellitus without complications: Secondary | ICD-10-CM | POA: Diagnosis not present

## 2019-07-31 DIAGNOSIS — I252 Old myocardial infarction: Secondary | ICD-10-CM | POA: Insufficient documentation

## 2019-07-31 DIAGNOSIS — I5032 Chronic diastolic (congestive) heart failure: Secondary | ICD-10-CM | POA: Diagnosis not present

## 2019-07-31 DIAGNOSIS — F17210 Nicotine dependence, cigarettes, uncomplicated: Secondary | ICD-10-CM | POA: Diagnosis not present

## 2019-07-31 DIAGNOSIS — E785 Hyperlipidemia, unspecified: Secondary | ICD-10-CM | POA: Insufficient documentation

## 2019-07-31 DIAGNOSIS — Z7982 Long term (current) use of aspirin: Secondary | ICD-10-CM | POA: Insufficient documentation

## 2019-07-31 DIAGNOSIS — I110 Hypertensive heart disease with heart failure: Secondary | ICD-10-CM | POA: Diagnosis not present

## 2019-07-31 DIAGNOSIS — I2510 Atherosclerotic heart disease of native coronary artery without angina pectoris: Secondary | ICD-10-CM | POA: Diagnosis not present

## 2019-07-31 DIAGNOSIS — I10 Essential (primary) hypertension: Secondary | ICD-10-CM | POA: Diagnosis not present

## 2019-07-31 DIAGNOSIS — M4800 Spinal stenosis, site unspecified: Secondary | ICD-10-CM | POA: Diagnosis not present

## 2019-07-31 DIAGNOSIS — Z955 Presence of coronary angioplasty implant and graft: Secondary | ICD-10-CM | POA: Insufficient documentation

## 2019-07-31 DIAGNOSIS — Z833 Family history of diabetes mellitus: Secondary | ICD-10-CM | POA: Diagnosis not present

## 2019-07-31 DIAGNOSIS — G4733 Obstructive sleep apnea (adult) (pediatric): Secondary | ICD-10-CM | POA: Diagnosis not present

## 2019-07-31 LAB — BASIC METABOLIC PANEL
Anion gap: 10 (ref 5–15)
BUN: 12 mg/dL (ref 8–23)
CO2: 27 mmol/L (ref 22–32)
Calcium: 9.1 mg/dL (ref 8.9–10.3)
Chloride: 96 mmol/L — ABNORMAL LOW (ref 98–111)
Creatinine, Ser: 0.76 mg/dL (ref 0.44–1.00)
GFR calc Af Amer: >60 mL/min (ref >60)
GFR calc non Af Amer: >60 mL/min (ref >60)
Glucose, Bld: 240 mg/dL — ABNORMAL HIGH (ref 70–99)
Potassium: 5.2 mmol/L — ABNORMAL HIGH (ref 3.5–5.1)
Sodium: 133 mmol/L — ABNORMAL LOW (ref 135–145)

## 2019-07-31 LAB — BRAIN NATRIURETIC PEPTIDE: B Natriuretic Peptide: 161.3 pg/mL — ABNORMAL HIGH (ref 0.0–100.0)

## 2019-07-31 MED ORDER — HYDRALAZINE HCL 50 MG PO TABS: 50.0000 mg | ORAL_TABLET | Freq: Two times a day (BID) | ORAL | Status: AC

## 2019-07-31 MED ORDER — CARVEDILOL 25 MG PO TABS: 25.0000 mg | ORAL_TABLET | Freq: Two times a day (BID) | ORAL | Status: AC

## 2019-07-31 MED ORDER — SPIRONOLACTONE 25 MG PO TABS: 12.5000 mg | ORAL_TABLET | Freq: Every day | ORAL | 3 refills | Status: DC

## 2019-07-31 NOTE — Progress Notes (Signed)
ADVANCED HF CLINIC NOTE  Referring Physician: Sharren Bridge NP-C  Primary Care: Primary Cardiologist: Sharren Bridge HF MD: Dr Haroldine Laws.    HPI: Ms Keeny is a 69 year old with a h/o cardiac arrest 2018, CAD S/P PCI Stent  to RCA 2002 and distal LAD 8657, chronic diastolic heart failure, HTN, Hyperlipidemia, spinal stenosis, tobacco use and DMII referred to HF clinic by Sharren Bridge NP at [facility] for HF follow up.   Admitted March 2018 to [facility] after cardiac arrest (PEA) thought to be from pulmonary edema .Ran out of lasix 2 weeks prior to admit. Initially intubated. Had strep pneumo in sputum. Ruled out for PE. No evidenced of ischemia. Diuresed with IV lasix transition to 60 mg torsemide daily. Discharge weight 191 pounds. cMRI showed EF 65%  She returns for f/u. We have not seen her for over 2 years. Says she is doing good. No CP. Never short of breath. No edema, orthopnea or PND. Main limitation is R hip pain. Has been getting shots in her bursa. Says it makes a big difference. BP at home 140s/60s. Using CPAP every night. On Chantix, has cut back to 2-3 cigs/day.    Cardiac studies:   CMRI 06/2016 1. The left ventricle is normal in cavity size and overall wall thickness. There is basal sigmoid septal hypertrophy. Global systolic function is normal. The LV ejection fraction is 65%. There is hypokinesia of the basal and mid inferior wall, and hypokinesia of the distal anterior wall. 2. The right ventricle is normal in cavity size, wall thickness, and systolic function. 3. The left atrium is mildly dilated, the right atrium is normal in size. 4. The aortic valve is trileaflet with no significant aortic valve stenosis or regurgitation. There is no significant valvular disease.  5. Delayed enhancement imaging demonstrates two areas with myocardial infarction: a. subendocardial (25% transmural extent) infarct in the mid and apical anterior wall (LAD territory). b. infarct in the basal to  distal inferior wall (RCA territory), which is 75% transmural at the base, and subendocardial (25% transmural extent) in the mid and distal segments. There is residual viability in all territories, except the basal inferior segment. 6. Regadenoson stress perfusion imaging demonstrates no evidence of inducible myocardial ischemia. 7. No LV thrombus seen.   Past Medical History:  Diagnosis Date  . Anemia   . Anxiety   . Arthritis   . Asthma   . Broken arm    left arm  . Bronchitis   . Bursitis of left hip   . CAD (coronary artery disease)   . Candidiasis, vagina   . Cardiac arrest ([place]) 06/20/2016   at [facility]; ? due to flash pulm edema vs undertreated chronic CHF  . Cerumen impaction   . CHF (congestive heart failure) ([place])    RECENT ADMIT TO [facility]   . Colon, diverticulosis   . Depression   . Diabetes mellitus    15 YRS AGO  . Fatigue   . Gastroenteritis   . Hot flashes, menopausal   . Hyperlipidemia   . Knee pain, left   . Lumbar back pain   . Maxillary sinusitis    history  . Muscle tear    right gluteus  . Nausea   . Nicotine dependence   . OSA (obstructive sleep apnea) 10/16/2017  . Other B-complex deficiencies   . Otitis media, acute    left  . Peripheral neuropathy   . Spinal stenosis of lumbar region   . Tubulovillous adenoma  of colon   . Unspecified essential hypertension     Current Outpatient Medications  Medication Sig Dispense Refill  . aspirin EC 81 MG tablet Take 81 mg by mouth daily.     . BD PEN NEEDLE NANO U/F 32G X 4 MM MISC USE TO INJECT INSULIN 5 TIMES A DAY 100 each 5  . bimatoprost (LUMIGAN) 0.01 % SOLN Lumigan 0.01 % eye drops  PUT 1 DROP INTO BOTH EYES AT BEDTIME    . Blood Glucose Monitoring Suppl (ACCU-CHEK COMPACT CARE KIT) KIT Accu-Chek Compact Plus Care kit  USE AS INSTRUCTED.    [name] carvedilol (COREG) 25 MG tablet Take 25 mg by mouth daily.    [name] glucose blood (FREESTYLE TEST STRIPS) test strip 1 each by Other route daily. (accu-chek  guide) DX E11.9 200 each 12  . hydrALAZINE (APRESOLINE) 50 MG tablet Take 100 mg by mouth daily.    . insulin aspart (NOVOLOG) 100 UNIT/ML FlexPen Inject 12 units 3 times daily with meals 33 mL 2  . Insulin Glargine (LANTUS SOLOSTAR) 100 UNIT/ML Solostar Pen Inject 38 Units into the skin at bedtime. 15 mL 9  . latanoprost (XALATAN) 0.005 % ophthalmic solution Place 1 drop into both eyes at bedtime.    [name] LORazepam (ATIVAN) 0.5 MG tablet Take one tab 30 mins prior to dental procedure. 5 tablet 0  . losartan (COZAAR) 100 MG tablet TAKE 1 TABLET BY MOUTH EVERY DAY 90 tablet 0  . rosuvastatin (CRESTOR) 20 MG tablet TAKE 1 TABLET BY MOUTH EVERY DAY 90 tablet 0  . sertraline (ZOLOFT) 50 MG tablet TAKE 1 TABLET BY MOUTH EVERY DAY 90 tablet 3  . tiZANidine (ZANAFLEX) 2 MG tablet Take by mouth every 6 (six) hours as needed for muscle spasms.    [name] torsemide (DEMADEX) 20 MG tablet TAKE 1 TO 2 TABLETS DAILY AS NEEDED FOR SWELLING 180 tablet 1  . varenicline (CHANTIX CONTINUING MONTH PAK) 1 MG tablet Take 1 tablet (1 mg total) by mouth 2 (two) times daily. 60 tablet 4   No current facility-administered medications for this encounter.    Allergies  Allergen Reactions  . Sitagliptin Other (See Comments)    Had pancreatitis while on Januvia       Social History   Socioeconomic History  . Marital status: Single    Spouse name: Not on file  . Number of children: Not on file  . Years of education: Not on file  . Highest education level: Not on file  Occupational History  . Occupation: SR DEV OFFICER    Employer: [employer]    Comment: [employer]  Tobacco Use  . Smoking status: Current Every Day Smoker    Packs/day: 0.25    Years: 46.00    Pack years: 11.50    Types: Cigarettes  . Smokeless tobacco: Never Used  . Tobacco comment: smokes two a day  Substance and Sexual Activity  . Alcohol use: Yes    Comment: rarely  . Drug use: Yes    Types: Marijuana    Comment: about two years ago  .  Sexual activity: Not on file  Other Topics Concern  . Not on file  Social History Narrative  . Not on file   Social Determinants of Health   Financial Resource Strain:   . Difficulty of Paying Living Expenses:   Food Insecurity:   . Worried About Charity fundraiser in the Last Year:   . Arboriculturist in  the Last Year:   Transportation Needs:   . Film/video editor (Medical):   [name] Lack of Transportation (Non-Medical):   Physical Activity:   . Days of Exercise per Week:   . Minutes of Exercise per Session:   Stress:   . Feeling of Stress :   Social Connections:   . Frequency of Communication with Friends and Family:   . Frequency of Social Gatherings with Friends and Family:   . Attends Religious Services:   . Active Member of Clubs or Organizations:   . Attends Archivist Meetings:   [name] Marital Status:   Intimate Partner Violence:   . Fear of Current or Ex-Partner:   . Emotionally Abused:   [name] Physically Abused:   . Sexually Abused:       Family History  Problem Relation Age of Onset  . Pneumonia Father        deceased  . Alcohol abuse Father   . Hypertension Mother   . Diabetes Mother     Vitals:   07/31/19 1144  BP: 140/80  Pulse: 84  SpO2: 98%  Weight: 87.8 kg (193 lb 9.6 oz)    PHYSICAL EXAM: General:  Well appearing. No resp difficulty HEENT: normal Neck: supple. No obvious JVD (hard to see with thick neck). Carotids 2+ bilat; no bruits. No lymphadenopathy or thryomegaly appreciated. Cor: PMI nondisplaced. Regular rate & rhythm. No rubs, gallops or murmurs. Lungs: clear Abdomen: obese soft, nontender, nondistended. No hepatosplenomegaly. No bruits or masses. Good bowel sounds. Extremities: no cyanosis, clubbing, rash, edema Neuro: alert & orientedx3, cranial nerves grossly intact. moves all 4 extremities w/o difficulty. Affect pleasant  ASSESSMENT & PLAN:  1. Chronic Diastolic Heart Failure:  - cMRI 3/18 EF 60-65%.  - Doing well  NYHA I-II. cMRI 3/18 EF 60-65% - Volume status looks good. Will add back spiro  - Repeat echo - Labs today and in 1 week with adding spiro    2. CAD- S/P Stent RCA 2002 and LAD 2015 - stress cMRI at [facility] 3/18 with EF 60-65% no reversible ischemia - No current s/s ischemia - Continue statin and ASA.   3. HTN - BP moderately elevated. - Will add back spiro 12.5 daily - Change hydralazine from 100 daily to 50 bid - Change carvedilol 50 daily to 25 bid - Refer to Dr. Oval Linsey in [facility] as she could also benefit from exercise program   4. Tobacco abuse - Working on smoking cessation. Continue Chantix  5. Spinal Stenosis - s/p cervical laminectomy.   6. OSA - continue CPAP  7. Morbid obesity - needs weight loss and more activity   Can graduate from HF Clinic. Will refer to Dr. Oval Linsey to help manage HTN and also follow general Cardiology issues.    Glori Bickers, MD 12:01 PM

## 2019-07-31 NOTE — Telephone Encounter (Signed)
Patient spoke with nurse triage. Patient reports she is having vaginal bleeding. She had a D andC procedure 5 years ago from a fibroid that broke off - states she had the same scenario. States she has gone through menopause. States the bright red bleeding started this morning and was quite a bit but now is just spotting. No pain. Spotting brown currently. Denies fever or any other symptoms. Please advise

## 2019-07-31 NOTE — Telephone Encounter (Signed)
Please advise 

## 2019-07-31 NOTE — Patient Instructions (Signed)
Start Spironolactone 12.5 mg (1/2 tab) daily  Change Hydralazine to 50 mg Twice daily   Change Carvedilol to 25 mg Twice daily   Labs done today, your results will be available in MyChart, we will contact you for abnormal readings.  Labs needed again in 1-2 weeks  Your physician has requested that you have an echocardiogram. Echocardiography is a painless test that uses sound waves to create images of your heart. It provides your doctor with information about the size and shape of your heart and how well your heart's chambers and valves are working. This procedure takes approximately one hour. There are no restrictions for this procedure.  You have been referred to follow with Dr Oval Linsey at [facility], her office will call you for that appointment, you do not need to see for 3 months

## 2019-07-31 NOTE — Telephone Encounter (Signed)
Pt should be seen.  Would benefit for OB/Gyn appt.  If pt does not have OB/Gyn, place urgent referral.  If pt having dizziness, increased bleeding, palpitations, fever, chills, etc she should proceed to the nearest ED.

## 2019-08-01 ENCOUNTER — Telehealth (HOSPITAL_COMMUNITY): Payer: Self-pay | Admitting: *Deleted

## 2019-08-01 ENCOUNTER — Other Ambulatory Visit: Payer: Self-pay

## 2019-08-01 DIAGNOSIS — N950 Postmenopausal bleeding: Secondary | ICD-10-CM

## 2019-08-01 NOTE — Telephone Encounter (Signed)
Spoke w/pt, she is aware, agreeable and verbalizes understanding. 

## 2019-08-01 NOTE — Telephone Encounter (Signed)
Spoke with pt given advise per Dr Volanda Napoleon recommendations, pt state that she has no other symptoms but the bleeding. Pt is aware to go to the ED if she developed symptoms of dizziness,increased bleeding,palpitations, fever or chills. Pt referral to OB/GYN  placed,pt verbalized understanding.

## 2019-08-01 NOTE — Telephone Encounter (Signed)
-----   Message from Jolaine Artist, MD sent at 07/31/2019  9:12 PM EDT ----- K 5.2. I added back spiro today. Tell her not to take.

## 2019-08-09 ENCOUNTER — Ambulatory Visit: Payer: Medicare PPO | Admitting: Obstetrics and Gynecology

## 2019-08-09 ENCOUNTER — Encounter: Payer: Self-pay | Admitting: Obstetrics and Gynecology

## 2019-08-09 ENCOUNTER — Other Ambulatory Visit: Payer: Self-pay

## 2019-08-09 VITALS — BP 120/74 | Ht 61.5 in | Wt 194.0 lb

## 2019-08-09 DIAGNOSIS — N950 Postmenopausal bleeding: Secondary | ICD-10-CM

## 2019-08-09 NOTE — Progress Notes (Signed)
[name] Bean [facility] 06-Aug-1950 [id number]  SUBJECTIVE:  69 y.o. G1P0010 female with elevated BMI, history of type 2 diabetes, CHF, CAD, presents for postmenopausal bleeding.  No pelvic pain or discomfort.  Enough bleeding to require her to wear a pad, intermittent bleeding. Lasted about 5 days. No bleeding now.  Menarche age 69 years old. She did have some postmenopausal bleeding 5 years ago which only lasted 2 days. She did have a biopsy at that time which was reportedly negative.  Records from [place] in 2016 are reviewed in care everywhere, she had a D&C with a polypectomy at that time.  Pathology report indicated she had a benign endometrial polyp. She never really had regular periods and stayed on the birth control pill through her mid 61s and when she stopped she did not have any periods.  Toward perimenopause in her 67s she was having bleeding weekly.  Recalls she was about 38 when she was amenorrheic for at least 1 year.  Never had much in the way of hot flashes she went through menopause.   Current Outpatient Medications  Medication Sig Dispense Refill  . aspirin EC 81 MG tablet Take 81 mg by mouth daily.     . BD PEN NEEDLE NANO U/F 32G X 4 MM MISC USE TO INJECT INSULIN 5 TIMES A DAY 100 each 5  . bimatoprost (LUMIGAN) 0.01 % SOLN Lumigan 0.01 % eye drops  PUT 1 DROP INTO BOTH EYES AT BEDTIME    . Blood Glucose Monitoring Suppl (ACCU-CHEK COMPACT CARE KIT) KIT Accu-Chek Compact Plus Care kit  USE AS INSTRUCTED.    [name] carvedilol (COREG) 25 MG tablet Take 1 tablet (25 mg total) by mouth in the morning and at bedtime.    [name] glucose blood (FREESTYLE TEST STRIPS) test strip 1 each by Other route daily. (accu-chek guide) DX E11.9 200 each 12  . hydrALAZINE (APRESOLINE) 50 MG tablet Take 1 tablet (50 mg total) by mouth in the morning and at bedtime.    . insulin aspart (NOVOLOG) 100 UNIT/ML FlexPen Inject 12 units 3 times daily with meals 33 mL 2  . Insulin Glargine (LANTUS SOLOSTAR) 100  UNIT/ML Solostar Pen Inject 38 Units into the skin at bedtime. 15 mL 9  . latanoprost (XALATAN) 0.005 % ophthalmic solution Place 1 drop into both eyes at bedtime.    [name] LORazepam (ATIVAN) 0.5 MG tablet Take one tab 30 mins prior to dental procedure. 5 tablet 0  . losartan (COZAAR) 100 MG tablet TAKE 1 TABLET BY MOUTH EVERY DAY 90 tablet 0  . rosuvastatin (CRESTOR) 20 MG tablet TAKE 1 TABLET BY MOUTH EVERY DAY 90 tablet 0  . sertraline (ZOLOFT) 50 MG tablet TAKE 1 TABLET BY MOUTH EVERY DAY 90 tablet 3  . tiZANidine (ZANAFLEX) 2 MG tablet Take by mouth every 6 (six) hours as needed for muscle spasms.    [name] torsemide (DEMADEX) 20 MG tablet TAKE 1 TO 2 TABLETS DAILY AS NEEDED FOR SWELLING 180 tablet 1  . varenicline (CHANTIX CONTINUING MONTH PAK) 1 MG tablet Take 1 tablet (1 mg total) by mouth 2 (two) times daily. 60 tablet 4   No current facility-administered medications for this visit.   Allergies: Sitagliptin  No LMP recorded. Patient is postmenopausal.  Past medical history,surgical history, problem list, medications, allergies, family history and social history were all reviewed and documented as reviewed in the EPIC chart.  ROS:  Feeling well. No dyspnea or chest pain on exertion.  No abdominal pain,  change in bowel habits, black or bloody stools.  No urinary tract symptoms. GYN ROS: no pelvic pain or discharge, no breast pain or new or enlarging lumps on self exam. No neurological complaints.    OBJECTIVE:  BP 120/74   Ht 5' 1.5" (1.562 m)   Wt 194 lb (88 kg)   BMI 36.06 kg/m  The patient appears well, alert, oriented x 3, in no distress. PELVIC EXAM: Deferred to future visit   ASSESSMENT:  69 y.o. G1P0010 here for evaluation of postmenopausal bleeding  PLAN:  I recommended the patient return for a sonohysterogram, and she is aware we may be performing an endometrial biopsy at that same time if there is identification of thickening of the endometrium on ultrasound.  She does have  risk factors for endometrial hyperplasia and carcinoma.  She will stop at the desk at checkout to set up this imaging appointment.   [name] MD 08/09/19

## 2019-08-10 ENCOUNTER — Ambulatory Visit (HOSPITAL_COMMUNITY)
Admission: RE | Admit: 2019-08-10 | Discharge: 2019-08-10 | Disposition: A | Discharge status: Home / Self Care | Payer: Medicare PPO | Source: Ambulatory Visit | Attending: Cardiology | Admitting: Cardiology

## 2019-08-10 ENCOUNTER — Ambulatory Visit (HOSPITAL_BASED_OUTPATIENT_CLINIC_OR_DEPARTMENT_OTHER)
Admission: RE | Admit: 2019-08-10 | Discharge: 2019-08-10 | Disposition: A | Discharge status: Home / Self Care | Payer: Medicare PPO | Source: Ambulatory Visit

## 2019-08-10 DIAGNOSIS — E785 Hyperlipidemia, unspecified: Secondary | ICD-10-CM | POA: Insufficient documentation

## 2019-08-10 DIAGNOSIS — E119 Type 2 diabetes mellitus without complications: Secondary | ICD-10-CM | POA: Diagnosis not present

## 2019-08-10 DIAGNOSIS — F17200 Nicotine dependence, unspecified, uncomplicated: Secondary | ICD-10-CM | POA: Insufficient documentation

## 2019-08-10 DIAGNOSIS — I5032 Chronic diastolic (congestive) heart failure: Secondary | ICD-10-CM

## 2019-08-10 DIAGNOSIS — I110 Hypertensive heart disease with heart failure: Secondary | ICD-10-CM | POA: Diagnosis not present

## 2019-08-10 LAB — BASIC METABOLIC PANEL
Anion gap: 10 (ref 5–15)
BUN: 13 mg/dL (ref 8–23)
CO2: 30 mmol/L (ref 22–32)
Calcium: 9.5 mg/dL (ref 8.9–10.3)
Chloride: 99 mmol/L (ref 98–111)
Creatinine, Ser: 0.85 mg/dL (ref 0.44–1.00)
GFR calc Af Amer: >60 mL/min (ref >60)
GFR calc non Af Amer: >60 mL/min (ref >60)
Glucose, Bld: 244 mg/dL — ABNORMAL HIGH (ref 70–99)
Potassium: 3.8 mmol/L (ref 3.5–5.1)
Sodium: 139 mmol/L (ref 135–145)

## 2019-08-10 MED ORDER — PERFLUTREN LIPID MICROSPHERE
1.0000 mL | INTRAVENOUS | Status: AC | PRN
  Administered 2019-08-10: 2 mL via INTRAVENOUS
  Filled 2019-08-10: qty 10

## 2019-08-10 NOTE — Progress Notes (Signed)
  Echocardiogram 2D Echocardiogram has been performed.  [name] Bean 08/10/2019, 2:56 PM

## 2019-08-16 ENCOUNTER — Other Ambulatory Visit: Payer: Self-pay | Admitting: Family Medicine

## 2019-08-18 ENCOUNTER — Encounter: Payer: Self-pay | Admitting: General Practice

## 2019-08-23 ENCOUNTER — Other Ambulatory Visit: Payer: Self-pay

## 2019-08-24 ENCOUNTER — Encounter: Payer: Self-pay | Admitting: Obstetrics and Gynecology

## 2019-08-24 ENCOUNTER — Other Ambulatory Visit: Payer: Self-pay | Admitting: Obstetrics and Gynecology

## 2019-08-24 ENCOUNTER — Ambulatory Visit (INDEPENDENT_AMBULATORY_CARE_PROVIDER_SITE_OTHER): Payer: Medicare PPO | Admitting: Obstetrics and Gynecology

## 2019-08-24 ENCOUNTER — Ambulatory Visit (INDEPENDENT_AMBULATORY_CARE_PROVIDER_SITE_OTHER): Payer: Medicare PPO

## 2019-08-24 DIAGNOSIS — N950 Postmenopausal bleeding: Secondary | ICD-10-CM | POA: Diagnosis not present

## 2019-08-24 DIAGNOSIS — R9389 Abnormal findings on diagnostic imaging of other specified body structures: Secondary | ICD-10-CM

## 2019-08-24 DIAGNOSIS — N9489 Other specified conditions associated with female genital organs and menstrual cycle: Secondary | ICD-10-CM | POA: Diagnosis not present

## 2019-08-24 NOTE — Progress Notes (Signed)
   Kathie Posa [facility] 1950/04/28 [id number]  SUBJECTIVE:  69 y.o. G1P0010 female with elevated BMI, history of type 2 diabetes, CHF, CAD, presents for a pelvic ultrasound for workup of postmenopausal bleeding.    Please see notes from her 08/09/2019 encounter for further details on presentation.  Allergies: Sitagliptin  No LMP recorded. Patient is postmenopausal.  Past medical history,surgical history, problem list, medications, allergies, family history and social history were all reviewed and documented as reviewed in the EPIC chart.   OBJECTIVE:  There were no vitals taken for this visit. The patient appears well, alert, oriented x 3, in no distress. PELVIC EXAM: Deferred to the OR  Pelvic ultrasound Small anteverted uterus, 6.8 x 4.8 x 3.0 cm No myometrial masses Endometrium 6.8 mm, thickened with 3D imaging indicating likely 1.2 cm endometrial polyp with feeder vessel identified Bilateral ovaries are small and atrophic No adnexal masses.  No free fluid.  ASSESSMENT:  69 y.o. G1P0010 here for evaluation of postmenopausal bleeding with likely endometrial polyp  PLAN:  I recommend proceeding with hysteroscopy dilation and curettage in the OR under anesthesia.  I discussed the same-day outpatient intent of the procedure, and risks of infection, bleeding, possible need for blood transfusion, perforation of uterus and/or cervix resulting in injury to surrounding organ structures including major pelvic blood vessels, bowel, bladder, and potentially ureter, and DVT.  Anesthesia will either be general or MAC depending on the anesthesiologist's choice, and inherent risks with being placed under anesthesia include myocardial infarction, stroke, rarely death.  Very rarely would laparoscopy and/or laparotomy be indicated to tend to any intra-abdominal hemorrhage and/or injury concern.  Postoperative recovery expectations of needing a few days away from employment duties in the absence of any  complication were also discussed.  I provided the patient with a printed informational pamphlet on hysteroscopy at checkout.  She agrees to proceed and I will have staff contact her to arrange for preoperative medical clearance and the procedure.  [name] MD 08/24/19

## 2019-08-24 NOTE — Patient Instructions (Signed)
Hysteroscopy °Hysteroscopy is a procedure that is used to examine the inside of a woman's womb (uterus). This may be done for various reasons, including: °· To look for lumps (tumors) and other growths in the uterus. °· To evaluate abnormal bleeding, fibroid tumors, polyps, scar tissue (adhesions), or cancer of the uterus. °· To determine the cause of an inability to get pregnant (infertility) or repeated losses of pregnancies (miscarriages). °· To find a lost IUD (intrauterine device). °· To perform a procedure that permanently prevents pregnancy (sterilization). °During this procedure, a thin, flexible tube with a small light and camera (hysteroscope) is used to examine the uterus. The camera sends images to a monitor in the room so that your health care provider can view the inside of your uterus. A hysteroscopy should be done right after a menstrual period to make sure that you are not pregnant. °Tell a health care provider about: °· Any allergies you have. °· All medicines you are taking, including vitamins, herbs, eye drops, creams, and over-the-counter medicines. °· Any problems you or family members have had with the use of anesthetic medicines. °· Any blood disorders you have. °· Any surgeries you have had. °· Any medical conditions you have. °· Whether you are pregnant or may be pregnant. °What are the risks? °Generally, this is a safe procedure. However, problems may occur, including: °· Excessive bleeding. °· Infection. °· Damage to the uterus or other structures or organs. °· Allergic reaction to medicines or fluids that are used in the procedure. °What happens before the procedure? °Staying hydrated °Follow instructions from your health care provider about hydration, which may include: °· Up to 2 hours before the procedure - you may continue to drink clear liquids, such as water, clear fruit juice, black coffee, and plain tea. °Eating and drinking restrictions °Follow instructions from your health care  provider about eating and drinking, which may include: °· 8 hours before the procedure - stop eating solid foods and drink clear liquids only °· 2 hours before the procedure - stop drinking clear liquids. °General instructions °· Ask your health care provider about: °? Changing or stopping your normal medicines. This is important if you take diabetes medicines or blood thinners. °? Taking medicines such as aspirin and ibuprofen. These medicines can thin your blood and cause bleeding. Do not take these medicines for 1 week before your procedure, or as told by your health care provider. °· Do not use any products that contain nicotine or tobacco for 2 weeks before the procedure. This includes cigarettes and e-cigarettes. If you need help quitting, ask your health care provider. °· Medicine may be placed in your cervix the day before the procedure. This medicine causes the cervix to have a larger opening (dilate). The larger opening makes it easier for the hysteroscope to be inserted into the uterus during the procedure. °· Plan to have someone with you for the first 24-48 hours after the procedure, especially if you are given a medicine to make you fall asleep (general anesthetic). °· Plan to have someone take you home from the hospital or clinic. °What happens during the procedure? °· To lower your risk of infection: °? Your health care team will wash or sanitize their hands. °? Your skin will be washed with soap. °? Hair may be removed from the surgical area. °· An IV tube will be inserted into one of your veins. °· You may be given one or more of the following: °? A medicine to help   you relax (sedative). °? A medicine that numbs the area around the cervix (local anesthetic). °? A medicine to make you fall asleep (general anesthetic). °· A hysteroscope will be inserted through your vagina and into your uterus. °· Air or fluid will be used to enlarge your uterus, enabling your health care provider to see your uterus  better. The amount of fluid used will be carefully checked throughout the procedure. °· In some cases, tissue may be gently scraped from inside the uterus and sent to a lab for testing (biopsy). °The procedure may vary among health care providers and hospitals. °What happens after the procedure? °· Your blood pressure, heart rate, breathing rate, and blood oxygen level will be monitored until the medicines you were given have worn off. °· You may have some cramping. You may be given medicines for this. °· You may have bleeding, which varies from light spotting to menstrual-like bleeding. This is normal. °· If you had a biopsy done, it is your responsibility to get the results of your procedure. Ask your health care provider, or the department performing the procedure, when your results will be ready. °Summary °· Hysteroscopy is a procedure that is used to examine the inside of a woman's womb (uterus). °· After the procedure, you may have bleeding, which varies from light spotting to menstrual-like bleeding. This is normal. You may also have cramping. °· Plan to have someone take you home from the hospital or clinic. °This information is not intended to replace advice given to you by your health care provider. Make sure you discuss any questions you have with your health care provider. °Document Revised: 03/12/2017 Document Reviewed: 04/28/2016 °Elsevier Patient Education © 2020 [employer] ° °

## 2019-08-28 ENCOUNTER — Telehealth: Payer: Self-pay

## 2019-08-28 NOTE — Telephone Encounter (Signed)
I spoke with patient to schedule surgery.  We discussed [name] Bean ins pays physicians fees 100% and she has $250 copymt to facility.  We discussed available dates and she chose 10/03/19 at 7:30am. I explained need for Covid test pre op and that was scheduled accordingly. I advised [name] Bean regarding quarantine protocol following that test.  I will mail [name] Bean a packet.

## 2019-08-29 ENCOUNTER — Encounter: Payer: Self-pay | Admitting: Gynecology

## 2019-09-25 DIAGNOSIS — E119 Type 2 diabetes mellitus without complications: Secondary | ICD-10-CM | POA: Diagnosis not present

## 2019-09-25 DIAGNOSIS — H401131 Primary open-angle glaucoma, bilateral, mild stage: Secondary | ICD-10-CM | POA: Diagnosis not present

## 2019-09-25 DIAGNOSIS — H04123 Dry eye syndrome of bilateral lacrimal glands: Secondary | ICD-10-CM | POA: Diagnosis not present

## 2019-09-25 DIAGNOSIS — H26492 Other secondary cataract, left eye: Secondary | ICD-10-CM | POA: Diagnosis not present

## 2019-09-26 ENCOUNTER — Encounter (HOSPITAL_BASED_OUTPATIENT_CLINIC_OR_DEPARTMENT_OTHER): Payer: Self-pay | Admitting: Obstetrics and Gynecology

## 2019-09-26 ENCOUNTER — Other Ambulatory Visit: Payer: Self-pay

## 2019-09-26 NOTE — Progress Notes (Addendum)
Spoke w/ via phone for pre-op interview--- Tretha Sciara Lab needs dos---- CBG              Lab results------ EKG/ECHo in Epic from 07/2019 COVID test ------ 09/29/19 Arrive at -------0530 NPO after ------MN Medications to take morning of surgery -----NONE Diabetic medication ----- 1/2 insulin dose night before, no diabetic medications AM of procedure. Patient Special Instructions ----- Patient advised to bring CPAP, patient refused stating she has never had to use it when she brought it and it is " too much to ask someone to bring their CPAP with them". Educated patient on need but she still refused. Pre-Op special Istructions ----- Patient verbalized understanding of instructions that were given at this phone interview. Patient denies shortness of breath, chest pain, fever, cough a this phone interview.

## 2019-09-29 ENCOUNTER — Other Ambulatory Visit (HOSPITAL_COMMUNITY)
Admission: RE | Admit: 2019-09-29 | Discharge: 2019-09-29 | Disposition: A | Discharge status: Home / Self Care | Payer: Medicare PPO | Source: Ambulatory Visit | Attending: Obstetrics and Gynecology | Admitting: Obstetrics and Gynecology

## 2019-09-29 DIAGNOSIS — Z01812 Encounter for preprocedural laboratory examination: Secondary | ICD-10-CM | POA: Diagnosis not present

## 2019-09-29 DIAGNOSIS — Z20822 Contact with and (suspected) exposure to covid-19: Secondary | ICD-10-CM | POA: Diagnosis not present

## 2019-09-29 NOTE — Progress Notes (Signed)
Spoke with [name] Bean zanetto pa, patient meets [facility] guidelines per jessica zanetto pa.

## 2019-09-30 LAB — SARS CORONAVIRUS 2 (TAT 6-24 HRS): SARS Coronavirus 2: NEGATIVE

## 2019-10-02 DIAGNOSIS — G4733 Obstructive sleep apnea (adult) (pediatric): Secondary | ICD-10-CM | POA: Diagnosis not present

## 2019-10-03 ENCOUNTER — Encounter (HOSPITAL_BASED_OUTPATIENT_CLINIC_OR_DEPARTMENT_OTHER): Payer: Self-pay | Admitting: Obstetrics and Gynecology

## 2019-10-03 ENCOUNTER — Ambulatory Visit (HOSPITAL_BASED_OUTPATIENT_CLINIC_OR_DEPARTMENT_OTHER)
Admission: RE | Admit: 2019-10-03 | Discharge: 2019-10-03 | Disposition: A | Discharge status: Home / Self Care | Payer: Medicare PPO | Attending: Obstetrics and Gynecology | Admitting: Obstetrics and Gynecology

## 2019-10-03 ENCOUNTER — Ambulatory Visit (HOSPITAL_BASED_OUTPATIENT_CLINIC_OR_DEPARTMENT_OTHER): Payer: Medicare PPO | Admitting: Physician Assistant

## 2019-10-03 ENCOUNTER — Encounter (HOSPITAL_BASED_OUTPATIENT_CLINIC_OR_DEPARTMENT_OTHER)
Admission: RE | Disposition: A | Discharge status: Home / Self Care | Payer: Self-pay | Source: Home / Self Care | Attending: Obstetrics and Gynecology

## 2019-10-03 DIAGNOSIS — I110 Hypertensive heart disease with heart failure: Secondary | ICD-10-CM | POA: Diagnosis not present

## 2019-10-03 DIAGNOSIS — I2510 Atherosclerotic heart disease of native coronary artery without angina pectoris: Secondary | ICD-10-CM | POA: Diagnosis not present

## 2019-10-03 DIAGNOSIS — Z79899 Other long term (current) drug therapy: Secondary | ICD-10-CM | POA: Insufficient documentation

## 2019-10-03 DIAGNOSIS — Z888 Allergy status to other drugs, medicaments and biological substances status: Secondary | ICD-10-CM | POA: Insufficient documentation

## 2019-10-03 DIAGNOSIS — Z9841 Cataract extraction status, right eye: Secondary | ICD-10-CM | POA: Insufficient documentation

## 2019-10-03 DIAGNOSIS — Z8674 Personal history of sudden cardiac arrest: Secondary | ICD-10-CM | POA: Insufficient documentation

## 2019-10-03 DIAGNOSIS — G4733 Obstructive sleep apnea (adult) (pediatric): Secondary | ICD-10-CM | POA: Insufficient documentation

## 2019-10-03 DIAGNOSIS — J45909 Unspecified asthma, uncomplicated: Secondary | ICD-10-CM | POA: Insufficient documentation

## 2019-10-03 DIAGNOSIS — E1142 Type 2 diabetes mellitus with diabetic polyneuropathy: Secondary | ICD-10-CM | POA: Insufficient documentation

## 2019-10-03 DIAGNOSIS — Z794 Long term (current) use of insulin: Secondary | ICD-10-CM | POA: Diagnosis not present

## 2019-10-03 DIAGNOSIS — Z955 Presence of coronary angioplasty implant and graft: Secondary | ICD-10-CM | POA: Diagnosis not present

## 2019-10-03 DIAGNOSIS — E785 Hyperlipidemia, unspecified: Secondary | ICD-10-CM | POA: Insufficient documentation

## 2019-10-03 DIAGNOSIS — F329 Major depressive disorder, single episode, unspecified: Secondary | ICD-10-CM | POA: Insufficient documentation

## 2019-10-03 DIAGNOSIS — N950 Postmenopausal bleeding: Secondary | ICD-10-CM | POA: Diagnosis not present

## 2019-10-03 DIAGNOSIS — Z7982 Long term (current) use of aspirin: Secondary | ICD-10-CM | POA: Diagnosis not present

## 2019-10-03 DIAGNOSIS — M1990 Unspecified osteoarthritis, unspecified site: Secondary | ICD-10-CM | POA: Diagnosis not present

## 2019-10-03 DIAGNOSIS — N840 Polyp of corpus uteri: Secondary | ICD-10-CM | POA: Insufficient documentation

## 2019-10-03 DIAGNOSIS — I509 Heart failure, unspecified: Secondary | ICD-10-CM | POA: Diagnosis not present

## 2019-10-03 DIAGNOSIS — Z9842 Cataract extraction status, left eye: Secondary | ICD-10-CM | POA: Diagnosis not present

## 2019-10-03 DIAGNOSIS — F419 Anxiety disorder, unspecified: Secondary | ICD-10-CM | POA: Diagnosis not present

## 2019-10-03 DIAGNOSIS — N736 Female pelvic peritoneal adhesions (postinfective): Secondary | ICD-10-CM | POA: Diagnosis not present

## 2019-10-03 HISTORY — PX: DILATATION & CURETTAGE/HYSTEROSCOPY WITH MYOSURE: SHX6511

## 2019-10-03 LAB — TYPE AND SCREEN
ABO/RH(D): B NEG
Antibody Screen: NEGATIVE

## 2019-10-03 LAB — GLUCOSE, CAPILLARY
Glucose-Capillary: 115 mg/dL — ABNORMAL HIGH (ref 70–99)
Glucose-Capillary: 102 mg/dL — ABNORMAL HIGH (ref 70–99)

## 2019-10-03 LAB — ABO/RH: ABO/RH(D): B NEG

## 2019-10-03 SURGERY — DILATATION & CURETTAGE/HYSTEROSCOPY WITH MYOSURE
Anesthesia: General | Site: Vagina

## 2019-10-03 MED ORDER — FENTANYL CITRATE (PF) 100 MCG/2ML IJ SOLN: 25.0000 ug | INTRAMUSCULAR | Status: DC | PRN

## 2019-10-03 MED ORDER — BUPIVACAINE HCL 0.5 % IJ SOLN
10.0000 mL | Freq: Once | INTRAMUSCULAR | Status: AC
  Administered 2019-10-03: 10 mL
  Filled 2019-10-03: qty 10

## 2019-10-03 MED ORDER — ONDANSETRON HCL 4 MG/2ML IJ SOLN
INTRAMUSCULAR | Status: AC
  Filled 2019-10-03: qty 2

## 2019-10-03 MED ORDER — LACTATED RINGERS IV SOLN: INTRAVENOUS | Status: DC

## 2019-10-03 MED ORDER — SODIUM CHLORIDE 0.9% FLUSH: 3.0000 mL | INTRAVENOUS | Status: DC | PRN

## 2019-10-03 MED ORDER — FENTANYL CITRATE (PF) 100 MCG/2ML IJ SOLN
INTRAMUSCULAR | Status: DC | PRN
  Administered 2019-10-03: 25 ug via INTRAVENOUS
  Administered 2019-10-03: 50 ug via INTRAVENOUS

## 2019-10-03 MED ORDER — SODIUM CHLORIDE 0.9% FLUSH: 3.0000 mL | Freq: Two times a day (BID) | INTRAVENOUS | Status: DC

## 2019-10-03 MED ORDER — GLYCOPYRROLATE PF 0.2 MG/ML IJ SOSY
PREFILLED_SYRINGE | INTRAMUSCULAR | Status: AC
  Filled 2019-10-03: qty 1

## 2019-10-03 MED ORDER — MIDAZOLAM HCL 2 MG/2ML IJ SOLN
INTRAMUSCULAR | Status: DC | PRN
  Administered 2019-10-03: .5 mg via INTRAVENOUS

## 2019-10-03 MED ORDER — PROPOFOL 10 MG/ML IV BOLUS
INTRAVENOUS | Status: AC
  Filled 2019-10-03: qty 60

## 2019-10-03 MED ORDER — PROPOFOL 10 MG/ML IV BOLUS
INTRAVENOUS | Status: DC | PRN
  Administered 2019-10-03: 120 mg via INTRAVENOUS
  Administered 2019-10-03: 40 mg via INTRAVENOUS

## 2019-10-03 MED ORDER — OXYCODONE HCL 5 MG PO TABS: 5.0000 mg | ORAL_TABLET | Freq: Once | ORAL | Status: DC | PRN

## 2019-10-03 MED ORDER — FENTANYL CITRATE (PF) 100 MCG/2ML IJ SOLN
INTRAMUSCULAR | Status: AC
  Filled 2019-10-03: qty 2

## 2019-10-03 MED ORDER — OXYCODONE HCL 5 MG PO TABS: 5.0000 mg | ORAL_TABLET | ORAL | Status: DC | PRN

## 2019-10-03 MED ORDER — ACETAMINOPHEN 325 MG RE SUPP: 650.0000 mg | RECTAL | Status: DC | PRN

## 2019-10-03 MED ORDER — ONDANSETRON HCL 4 MG/2ML IJ SOLN: 4.0000 mg | Freq: Once | INTRAMUSCULAR | Status: DC | PRN

## 2019-10-03 MED ORDER — KETOROLAC TROMETHAMINE 15 MG/ML IJ SOLN
15.0000 mg | Freq: Four times a day (QID) | INTRAMUSCULAR | Status: DC
  Administered 2019-10-03: 15 mg via INTRAVENOUS

## 2019-10-03 MED ORDER — ONDANSETRON HCL 4 MG/2ML IJ SOLN
INTRAMUSCULAR | Status: DC | PRN
  Administered 2019-10-03: 4 mg via INTRAVENOUS

## 2019-10-03 MED ORDER — OXYCODONE HCL 5 MG/5ML PO SOLN: 5.0000 mg | Freq: Once | ORAL | Status: DC | PRN

## 2019-10-03 MED ORDER — PHENYLEPHRINE 40 MCG/ML (10ML) SYRINGE FOR IV PUSH (FOR BLOOD PRESSURE SUPPORT)
PREFILLED_SYRINGE | INTRAVENOUS | Status: AC
  Filled 2019-10-03: qty 10

## 2019-10-03 MED ORDER — PHENYLEPHRINE HCL (PRESSORS) 10 MG/ML IV SOLN
INTRAVENOUS | Status: DC | PRN
Start: 2019-10-03 — End: 2019-10-03
  Administered 2019-10-03 (×4): 80 ug via INTRAVENOUS

## 2019-10-03 MED ORDER — LIDOCAINE 2% (20 MG/ML) 5 ML SYRINGE
INTRAMUSCULAR | Status: AC
  Filled 2019-10-03: qty 5

## 2019-10-03 MED ORDER — ACETAMINOPHEN 500 MG PO TABS
1000.0000 mg | ORAL_TABLET | Freq: Four times a day (QID) | ORAL | Status: AC | PRN
Start: 2019-10-03 — End: ?

## 2019-10-03 MED ORDER — LIDOCAINE HCL (CARDIAC) PF 100 MG/5ML IV SOSY
PREFILLED_SYRINGE | INTRAVENOUS | Status: DC | PRN
  Administered 2019-10-03: 80 mg via INTRAVENOUS

## 2019-10-03 MED ORDER — SODIUM CHLORIDE 0.9 % IR SOLN
Status: DC | PRN
  Administered 2019-10-03: 1500 mL

## 2019-10-03 MED ORDER — KETOROLAC TROMETHAMINE 15 MG/ML IJ SOLN
INTRAMUSCULAR | Status: AC
  Filled 2019-10-03: qty 1

## 2019-10-03 MED ORDER — SODIUM CHLORIDE 0.9 % IV SOLN: 250.0000 mL | INTRAVENOUS | Status: DC | PRN

## 2019-10-03 MED ORDER — IBUPROFEN 200 MG PO TABS
600.0000 mg | ORAL_TABLET | Freq: Four times a day (QID) | ORAL | Status: AC | PRN
Start: 2019-10-03 — End: ?

## 2019-10-03 MED ORDER — MIDAZOLAM HCL 2 MG/2ML IJ SOLN
INTRAMUSCULAR | Status: AC
  Filled 2019-10-03: qty 2

## 2019-10-03 MED ORDER — ACETAMINOPHEN 325 MG PO TABS: 650.0000 mg | ORAL_TABLET | ORAL | Status: DC | PRN

## 2019-10-03 SURGICAL SUPPLY — 21 items
CATH ROBINSON RED A/P 16FR (CATHETERS) ×2 IMPLANT
COVER WAND RF STERILE (DRAPES) ×2 IMPLANT
DEVICE MYOSURE LITE (MISCELLANEOUS) ×2 IMPLANT
DEVICE MYOSURE REACH (MISCELLANEOUS) IMPLANT
DILATOR CANAL MILEX (MISCELLANEOUS) IMPLANT
ELECT REM PT RETURN 9FT ADLT (ELECTROSURGICAL)
ELECTRODE REM PT RTRN 9FT ADLT (ELECTROSURGICAL) IMPLANT
GAUZE 4X4 16PLY RFD (DISPOSABLE) IMPLANT
GLOVE BIO SURGEON STRL SZ8 (GLOVE) ×2 IMPLANT
GLOVE BIOGEL PI IND STRL 8 (GLOVE) ×2 IMPLANT
GLOVE BIOGEL PI INDICATOR 8 (GLOVE) ×2
GOWN STRL REUS W/TWL XL LVL3 (GOWN DISPOSABLE) ×2 IMPLANT
IV NS IRRIG 3000ML ARTHROMATIC (IV SOLUTION) ×2 IMPLANT
KIT PROCEDURE FLUENT (KITS) ×2 IMPLANT
MYOSURE XL FIBROID (MISCELLANEOUS)
PACK VAGINAL MINOR WOMEN LF (CUSTOM PROCEDURE TRAY) ×2 IMPLANT
PAD OB MATERNITY 4.3X12.25 (PERSONAL CARE ITEMS) ×2 IMPLANT
PAD PREP 24X48 CUFFED NSTRL (MISCELLANEOUS) ×2 IMPLANT
SEAL CERVICAL OMNI LOK (ABLATOR) IMPLANT
SEAL ROD LENS SCOPE MYOSURE (ABLATOR) ×2 IMPLANT
SYSTEM TISS REMOVAL MYOSURE XL (MISCELLANEOUS) IMPLANT

## 2019-10-03 NOTE — H&P (Signed)
[name] Bean [birth year] MRN: [id number]  HPI The patient is a 69 y.o. G1P0010 who presents today for scheduled hysteroscopy D&C for postmenopausal bleeding with suspected endometrial polyp identified on pelvic ultrasound.  No changes to her medical history since her pre op exam, denies CP, SOB, fever/chills, dysuria.  Past Medical History:  Diagnosis Date  . Anemia   . Anxiety   . Arthritis   . Asthma   . Broken arm    left arm  . Bronchitis   . Bursitis of left hip   . CAD (coronary artery disease)   . Candidiasis, vagina   . Cardiac arrest ([name] Bean) 06/20/2016   at [facility]; ? due to flash pulm edema vs undertreated chronic CHF  . Cerumen impaction   . CHF (congestive heart failure) ([name] Bean)    RECENT ADMIT TO [facility]   . Colon, diverticulosis   . Depression   . Diabetes mellitus    15 YRS AGO  . Fatigue   . Gastroenteritis   . Hot flashes, menopausal   . Hyperlipidemia   . Knee pain, left   . Lumbar back pain   . Maxillary sinusitis    history  . Muscle tear    right gluteus  . Nausea   . Nicotine dependence   . OSA (obstructive sleep apnea) 10/16/2017  . Other B-complex deficiencies   . Otitis media, acute    left  . Peripheral neuropathy   . Spinal stenosis of lumbar region   . Tubulovillous adenoma of colon   . Unspecified essential hypertension    Past Surgical History:  Procedure Laterality Date  . CARDIAC CATHETERIZATION  2015, 2009, 2006  . CERVICAL SPINE SURGERY  2003  . CORONARY ANGIOPLASTY WITH STENT PLACEMENT  2002, 2015  . EYE SURGERY     cataracts bilaterally  . POSTERIOR CERVICAL LAMINECTOMY N/A 10/23/2016   Procedure: POSTERIOR CERVICAL LAMINECTOMY MULTI LEVEL CERVICAL TWO- CERVICAL THREE, CERVICAL THREE- CERVICAL FOUR;  Surgeon: Ashok Pall, MD;  Location: [place];  Service: Neurosurgery;  Laterality: N/A;  POSTERIOR   Allergies  Allergen Reactions  . Sitagliptin Other (See Comments)    Had pancreatitis while on Januvia    No current  facility-administered medications on file prior to encounter.   Current Outpatient Medications on File Prior to Encounter  Medication Sig Dispense Refill  . BD PEN NEEDLE NANO U/F 32G X 4 MM MISC USE TO INJECT INSULIN 5 TIMES A DAY 100 each 5  . carvedilol (COREG) 25 MG tablet Take 1 tablet (25 mg total) by mouth in the morning and at bedtime.    . hydrALAZINE (APRESOLINE) 50 MG tablet Take 1 tablet (50 mg total) by mouth in the morning and at bedtime.    . insulin aspart (NOVOLOG) 100 UNIT/ML FlexPen Inject 12 units 3 times daily with meals 33 mL 2  . LANTUS SOLOSTAR 100 UNIT/ML Solostar Pen INJECT 38 UNITS INTO THE SKIN AT BEDTIME. 15 mL 2  . latanoprost (XALATAN) 0.005 % ophthalmic solution Place 1 drop into both eyes at bedtime.    [name] losartan (COZAAR) 100 MG tablet TAKE 1 TABLET BY MOUTH EVERY DAY 90 tablet 0  . rosuvastatin (CRESTOR) 20 MG tablet TAKE 1 TABLET BY MOUTH EVERY DAY 90 tablet 0  . sertraline (ZOLOFT) 50 MG tablet TAKE 1 TABLET BY MOUTH EVERY DAY 90 tablet 3  . tiZANidine (ZANAFLEX) 2 MG tablet Take by mouth every 6 (six) hours as needed for muscle spasms.    [name]  torsemide (DEMADEX) 20 MG tablet TAKE 1 TO 2 TABLETS DAILY AS NEEDED FOR SWELLING 180 tablet 1  . varenicline (CHANTIX CONTINUING MONTH PAK) 1 MG tablet Take 1 tablet (1 mg total) by mouth 2 (two) times daily. 60 tablet 4  . aspirin EC 81 MG tablet Take 81 mg by mouth daily.     . bimatoprost (LUMIGAN) 0.01 % SOLN Lumigan 0.01 % eye drops  PUT 1 DROP INTO BOTH EYES AT BEDTIME    . Blood Glucose Monitoring Suppl (ACCU-CHEK COMPACT CARE KIT) KIT Accu-Chek Compact Plus Care kit  USE AS INSTRUCTED.    [name] glucose blood (FREESTYLE TEST STRIPS) test strip 1 each by Other route daily. (accu-chek guide) DX E11.9 200 each 12  . LORazepam (ATIVAN) 0.5 MG tablet Take one tab 30 mins prior to dental procedure. 5 tablet 0      Physical Exam   BP (!) 107/59   Pulse 75   Temp 97.6 F (36.4 C) (Oral)   Resp 18   Ht _0  (1.575  m)   Wt 89.2 kg   SpO2 99%   BMI 35.96 kg/m    General: Pleasant female, no acute distress, alert and oriented CV: RRR, no murmurs Pulm: good respiratory effort, CTAB     Plan Proceed with hysteroscopy D&C as planned.  All questions answered and the patient agrees to proceed.    [name], MD 10/03/19

## 2019-10-03 NOTE — Op Note (Signed)
Name: [name] Bean  Age: 69 y.o.  Date of Birth: Mar 12, 1951 Medical Record #: [id number]   Operative Note   Preoperative Diagnosis: Post menopausal bleeding, suspected endometrial polyp Procedure: Hysteroscopy, Dilatation and Curettage, Myosure polypectomy and adhesiolysis  Postoperative Diagnosis: same and endometrial adhesion Surgeon: [name], MD Estimated Blood Loss: 5 mL Anesthesia: General LMA, local with 0.5% bupivacaine (10 mL) Specimens: Endometrial curettings with polyp Findings: Narrow bony pelvis architecture.  Mostly atropic appearing endometrial lining with bilateral fallopian tube ostia obscured by overlying tissue.  Occasional thickening of endometrium is encountered and sampled.  Normal cavity contour without submucosal fibroids.  Adhesion between anterior and posterior walls of endometrial cavity on the right side.  2 cm fundal polyp attached at the left aspect of the endometrial cavity. Polyp appears cystic.  Endocervical canal normal. Uterus sounds to 8 cm.  Hysteroscopic fluid deficit 300 mL.  Complication: none. Date: 10/03/19     DESCRIPTION OF PROCEDURE:      Preoperative review of the procedure was completed with the patient prior to transport to the operating room including risks, benefits, and alternatives.  The patient's questions were answered and she agreed to proceed.    Under anesthesia, [name] Bean was placed in the dorsolithotomy position with legs in [place] with SCDs applied and running.  A surgical team time out was performed to verify and agree on procedure and patient consent. A bimanual exam was performed.  The patient was prepped and draped in the usual sterile fashion.      The vagina did not accommodate a weighted speculum due to the narrow bony architecture.  An open sided speculum was placed in the vagina instead.  A paracervical block was applied in the standard fashion using 0.5% bupivacaine. The anterior  cervix was grasped with a single-toothed tenaculum. The uterine descensus was noted to be poor.  The uterine cavity was very gently sounded to establish a measurement of uterine cavity depth of 8 cm.  The cervix was gently dilated using a progressive series of Pratt dilators to size 17.  There was no concern for uterine or cervical perforation.  The Myosure hysteroscope was introduced and the uterine cavity was visualized with findings as noted above.  The Myosure Lite was inserted through the operative port of the hysteroscope.  Myosure was activated under direct hysteroscopic visualization and the polypectomy and adhesiolysis were performed, along with sampling of a few scattered areas of thickened endometrium.  The cervix was gently dilated a second time to size 23 allow introduction of a #3/medium diameter curette.  A thorough curettage was productive of scant endometrial tissue. The curettage was effective at achieving a uterine cry circumferentially.  Bleeding was minimal at that point.  The tenaculum was removed from the cervix and the puncture sites were hemostatic.   Sponge counts were correct at the conclusion of the procedure. The patient tolerated the procedure well and was brought to the recovery room in stable condition.      [name], M.D., [name] Bean

## 2019-10-03 NOTE — Addendum Note (Signed)
Addendum  created 10/03/19 1244 by Georgeanne Nim, CRNA   Flowsheet accepted, Intraprocedure Event edited, Intraprocedure Flowsheets edited

## 2019-10-03 NOTE — Transfer of Care (Signed)
Immediate Anesthesia Transfer of Care Note  Patient: [name] Bean  Procedure(s) Performed: DILATATION & CURETTAGE/HYSTEROSCOPY WITH MYOSURE (N/A Vagina )  Patient Location: PACU  Anesthesia Type:General  Level of Consciousness: awake, oriented and patient cooperative  Airway & Oxygen Therapy: Patient Spontanous Breathing  Post-op Assessment: Report given to RN and Post -op Vital signs reviewed and stable  Post vital signs: Reviewed and stable  Last Vitals:  Vitals Value Taken Time  BP 144/70 10/03/19 0818  Temp    Pulse 78 10/03/19 0820  Resp 13 10/03/19 0820  SpO2 94 % 10/03/19 0820  Vitals shown include unvalidated device data.  Last Pain:  Vitals:   10/03/19 0620  TempSrc: Oral  PainSc: 2       Patients Stated Pain Goal: 5 (17/83/75 4237)  Complications: No complications documented.

## 2019-10-03 NOTE — Anesthesia Preprocedure Evaluation (Signed)
Anesthesia Evaluation  Patient identified by MRN, date of birth, ID band Patient awake    Reviewed: Allergy & Precautions, NPO status , Patient's Chart, lab work & pertinent test results  History of Anesthesia Complications Negative for: history of anesthetic complications  Airway Mallampati: II  TM Distance: >3 FB Neck ROM: Full    Dental  (+) Poor Dentition, Missing   Pulmonary asthma , sleep apnea and Continuous Positive Airway Pressure Ventilation , Current Smoker and Patient abstained from smoking.,    Pulmonary exam normal        Cardiovascular hypertension, + CAD, + Cardiac Stents and +CHF  Normal cardiovascular exam     Neuro/Psych negative neurological ROS  negative psych ROS   GI/Hepatic negative GI ROS, Neg liver ROS,   Endo/Other  diabetes  Renal/GU negative Renal ROS  negative genitourinary   Musculoskeletal negative musculoskeletal ROS (+)   Abdominal   Peds  Hematology negative hematology ROS (+)   Anesthesia Other Findings   Reproductive/Obstetrics                            Anesthesia Physical Anesthesia Plan  ASA: IV  Anesthesia Plan: General   Post-op Pain Management:    Induction: Intravenous  PONV Risk Score and Plan: 3 and Ondansetron, Dexamethasone, Midazolam and Treatment may vary due to age or medical condition  Airway Management Planned: LMA  Additional Equipment: None  Intra-op Plan:   Post-operative Plan: Extubation in OR  Informed Consent: I have reviewed the patients History and Physical, chart, labs and discussed the procedure including the risks, benefits and alternatives for the proposed anesthesia with the patient or authorized representative who has indicated his/her understanding and acceptance.     Dental advisory given  Plan Discussed with:   Anesthesia Plan Comments:         Anesthesia Quick Evaluation

## 2019-10-03 NOTE — Progress Notes (Signed)
Pt stated she didn't have anyone to stay w her tonight and that anesthesia is aware. Dr. Denyse Dago stated she was unaware of pt being d/c'd w/o responsible adult in the house. Per Dr. Kerin Perna pt can plan to stay the night or find responsible adult to be w/her tonight .  Pt informed and requested to speak w DR. Whittman.  When face to face w Dr. Kerin Perna, pt stated she had texted her neighbor, who is willing to stay with her tonight. [place] for d/c per Dr. Kerin Perna.

## 2019-10-03 NOTE — Anesthesia Procedure Notes (Signed)
Procedure Name: LMA Insertion Date/Time: 10/03/2019 7:30 AM Performed by: Georgeanne Nim, CRNA Pre-anesthesia Checklist: Patient identified, Emergency Drugs available, Suction available, Patient being monitored and Timeout performed Patient Re-evaluated:Patient Re-evaluated prior to induction Oxygen Delivery Method: Circle system utilized Preoxygenation: Pre-oxygenation with 100% oxygen Induction Type: IV induction LMA: LMA inserted LMA Size: 4.0 Number of attempts: 1 Placement Confirmation: positive ETCO2,  CO2 detector and breath sounds checked- equal and bilateral Tube secured with: Tape Dental Injury: Teeth and Oropharynx as per pre-operative assessment

## 2019-10-03 NOTE — Progress Notes (Signed)
Pt reminded to use cpap while napping today.  Pt staes she doesn't check her blood sugars at home.  Pt takes novolog insulin3x/day and lantus HS. Dr. Roger Kill informed.  Pt instructed , per Dr. Roger Kill, if she isn't eating a full meal, hold novolin, but ok to take hs lantus.  Pt verbalized her understanding.

## 2019-10-03 NOTE — Anesthesia Postprocedure Evaluation (Signed)
Anesthesia Post Note  Patient: [name] Bean  Procedure(s) Performed: DILATATION & CURETTAGE/HYSTEROSCOPY WITH MYOSURE (N/A Vagina )     Patient location during evaluation: PACU Anesthesia Type: General Level of consciousness: awake and alert Pain management: pain level controlled Vital Signs Assessment: post-procedure vital signs reviewed and stable Respiratory status: spontaneous breathing, nonlabored ventilation and respiratory function stable Cardiovascular status: blood pressure returned to baseline and stable Postop Assessment: no apparent nausea or vomiting Anesthetic complications: no   No complications documented.  Last Vitals:  Vitals:   10/03/19 0845 10/03/19 1030  BP: 129/65 131/66  Pulse: 70 68  Resp: 15 16  Temp: (!) 36.4 C (!) 36.3 C  SpO2: 99% 98%    Last Pain:  Vitals:   10/03/19 1045  TempSrc:   PainSc: 0-No pain                 Lidia Collum

## 2019-10-03 NOTE — Discharge Instructions (Signed)
  Post Anesthesia Home Care Instructions  Activity: Get plenty of rest for the remainder of the day. A responsible individual must stay with you for 24 hours following the procedure.  For the next 24 hours, DO NOT: -Drive a car -Paediatric nurse -Drink alcoholic beverages -Take any medication unless instructed by your physician -Make any legal decisions or sign important papers.  Meals: Start with liquid foods such as gelatin or soup. Progress to regular foods as tolerated. Avoid greasy, spicy, heavy foods. If nausea and/or vomiting occur, drink only clear liquids until the nausea and/or vomiting subsides. Call your physician if vomiting continues.  Special Instructions/Symptoms: Your throat may feel dry or sore from the anesthesia or the breathing tube placed in your throat during surgery. If this causes discomfort, gargle with warm salt water. The discomfort should disappear within 24 hours.  If you had a scopolamine patch placed behind your ear for the management of post- operative nausea and/or vomiting:  1. The medication in the patch is effective for 72 hours, after which it should be removed.  Wrap patch in a tissue and discard in the trash. Wash hands thoroughly with soap and water. 2. You may remove the patch earlier than 72 hours if you experience unpleasant side effects which may include dry mouth, dizziness or visual disturbances. 3. Avoid touching the patch. Wash your hands with soap and water after contact with the patch.      NO IBUPROFEN PRODUCTS (MOTRIN, ADVIL) OR ALEVE UNTIL 3:00PM TODAY.

## 2019-10-04 ENCOUNTER — Encounter (HOSPITAL_BASED_OUTPATIENT_CLINIC_OR_DEPARTMENT_OTHER): Payer: Self-pay | Admitting: Obstetrics and Gynecology

## 2019-10-04 LAB — SURGICAL PATHOLOGY

## 2019-10-05 ENCOUNTER — Ambulatory Visit: Payer: Medicare PPO | Admitting: Physical Therapy

## 2019-10-05 ENCOUNTER — Ambulatory Visit: Payer: Medicare PPO | Attending: Neurosurgery | Admitting: Physical Therapy

## 2019-10-05 ENCOUNTER — Other Ambulatory Visit: Payer: Self-pay

## 2019-10-05 DIAGNOSIS — M25551 Pain in right hip: Secondary | ICD-10-CM | POA: Diagnosis not present

## 2019-10-05 DIAGNOSIS — M25651 Stiffness of right hip, not elsewhere classified: Secondary | ICD-10-CM | POA: Diagnosis not present

## 2019-10-06 ENCOUNTER — Encounter: Payer: Self-pay | Admitting: Physical Therapy

## 2019-10-06 NOTE — Therapy (Signed)
[place], [name], [address] Phone: [phone]   Fax:  [phone]  Physical Therapy Evaluation  Patient Details  Name: [name] Bean MRN: [id number] Date of Birth: [birth year] Referring Provider (PT): Dr. Ashok Pall    Encounter Date: 10/05/2019   PT End of Session - 10/05/19 1731    Visit Number 1    Number of Visits 16    Date for PT Re-Evaluation 11/30/19    Authorization Type UHC MCR    PT Start Time 1536    PT Stop Time 3500    PT Time Calculation (min) 39 min    Activity Tolerance Patient tolerated treatment well    Behavior During Therapy [facility] for tasks assessed/performed           Past Medical History:  Diagnosis Date  . Anemia   . Anxiety   . Arthritis   . Asthma   . Broken arm    left arm  . Bronchitis   . Bursitis of left hip   . CAD (coronary artery disease)   . Candidiasis, vagina   . Cardiac arrest ([place]) 06/20/2016   at [facility]; ? due to flash pulm edema vs undertreated chronic CHF  . Cerumen impaction   . CHF (congestive heart failure) ([place])    RECENT ADMIT TO [facility]   . Colon, diverticulosis   . Depression   . Diabetes mellitus    15 YRS AGO  . Fatigue   . Gastroenteritis   . Hot flashes, menopausal   . Hyperlipidemia   . Knee pain, left   . Lumbar back pain   . Maxillary sinusitis    history  . Muscle tear    right gluteus  . Nausea   . Nicotine dependence   . OSA (obstructive sleep apnea) 10/16/2017  . Other B-complex deficiencies   . Otitis media, acute    left  . Peripheral neuropathy   . Spinal stenosis of lumbar region   . Tubulovillous adenoma of colon   . Unspecified essential hypertension     Past Surgical History:  Procedure Laterality Date  . CARDIAC CATHETERIZATION  2015, 2009, 2006  . CERVICAL SPINE SURGERY  2003  . CORONARY ANGIOPLASTY WITH STENT PLACEMENT  2002, 2015  . DILATATION & CURETTAGE/HYSTEROSCOPY WITH MYOSURE N/A 10/03/2019    Procedure: DILATATION & CURETTAGE/HYSTEROSCOPY WITH MYOSURE;  Surgeon: [name], MD;  Location: [place];  Service: Gynecology;  Laterality: N/A;  request 7:30am OR time in [facility] Gynecology requests 30 minutes OR time  . EYE SURGERY     cataracts bilaterally  . POSTERIOR CERVICAL LAMINECTOMY N/A 10/23/2016   Procedure: POSTERIOR CERVICAL LAMINECTOMY MULTI LEVEL CERVICAL TWO- CERVICAL THREE, CERVICAL THREE- CERVICAL FOUR;  Surgeon: Ashok Pall, MD;  Location: [place];  Service: Neurosurgery;  Laterality: N/A;  POSTERIOR    There were no vitals filed for this visit.    Subjective Assessment - 10/05/19 1543    Subjective Patient presents with Rt hip pain which is chronic. I have seen her for her back, hip and neck prior.    She had an injection recently in her Rt hip x 2 (for bursitis) and that has helped her.  She noticed her Rt hip pain began to return about 2 weeks ago.  Sitting on the couch makes her pain worse, also needs a new mattress. She wants to be able to increase her activity and has been doing what exercises she can at home.  Pertinent History Jan 2020 CTS, cervical laminectomy/fusion, cardiac, diabetes, ataxia, falls    Limitations Standing;Walking;House hold activities    How long can you walk comfortably? with a cane she wears out and with a rolling walking or buggy she is fine    Diagnostic tests none recent    Patient Stated Goals I want to conquer the steps and I want to move more    Currently in Pain? No/denies    Pain Score --   can be severe with walking without walker   Pain Location Hip    Pain Orientation Right    Pain Descriptors / Indicators Aching;Tightness;Sore    Pain Type Chronic pain    Multiple Pain Sites No              [facility] PT Assessment - 10/06/19 0001      Assessment   Medical Diagnosis R hip pain     Referring Provider (PT) Dr. Ashok Pall     Onset Date/Surgical Date --   acute on chronic    Prior Therapy  Yes       Precautions   Precautions Fall    Precaution Comments has not had a fall in 6 mos       Restrictions   Weight Bearing Restrictions No      Balance Screen   Has the patient fallen in the past 6 months No      [place] residence    Living Arrangements Alone    Type of [place] - 4 wheels;Cane - quad      Prior Function   Level of Independence Independent;Independent with basic ADLs;Independent with household mobility with device;Independent with community mobility with device    Vocation Retired;Unemployed    Leisure very active, friends, travel, shopping       Cognition   Overall Cognitive Status Within Functional Limits for tasks assessed      Observation/Other Assessments   Focus on Therapeutic Outcomes (FOTO)  63%      Observation/Other Assessments-Edema    Edema --   L ankle not measured      Sensation   Light Touch Appears Intact      Coordination   Gross Motor Movements are Fluid and Coordinated No    Coordination and Movement Description ataxia with gait       Posture/Postural Control   Posture/Postural Control Postural limitations    Postural Limitations Rounded Shoulders;Forward head    Posture Comments hips in External rotation      AROM   Right Hip External Rotation  35    Right Hip Internal Rotation  8    Left Hip External Rotation  30    Left Hip Internal Rotation  10      PROM   Overall PROM Comments approx 20 deg IR bilateral hips       Strength   Right Hip Flexion 5/5    Right Hip Extension 4/5    Right Hip ABduction 4/5    Left Hip Flexion 5/5    Left Hip Extension 4/5    Right Knee Flexion 5/5    Right Knee Extension 5/5    Left Knee Flexion 5/5    Left Knee Extension 5/5    Right Ankle Dorsiflexion 5/5    Left Ankle Dorsiflexion 5/5      Flexibility   Hamstrings tight    Quadriceps tight, psoas  Piriformis tight       Palpation   Palpation comment  tender just posterior to Greater trochanter , min in glute medius       Transfers   Five time sit to stand comments  11 sec       Ambulation/Gait   Gait Pattern Step-through pattern;Decreased stride length;Ataxic;Abducted - left;Abducted- right                      Objective measurements completed on examination: See above findings.               PT Education - 10/05/19 1731    Education Details PT/POC, pilates and options for exercise    Person(s) Educated Patient    Methods Explanation    Comprehension Verbalized understanding            PT Short Term Goals - 10/05/19 1734      PT SHORT TERM GOAL #1   Title Pt will complete balance re-assessment and understand results, set goal for this episode.    Time 4    Period Weeks    Status New    Target Date 11/02/19      PT SHORT TERM GOAL #2   Title Pt will be I with simple HEP for hip, core and balance    Time 4    Period Weeks    Status New    Target Date 11/02/19             PT Long Term Goals - 10/05/19 1737      PT LONG TERM GOAL #1   Title Pt will be able to negotiate 8 steps with 1 rail and improved confidence, less dependence on UEs, reciprocal pattern.    Time 8    Period Weeks    Status New    Target Date 11/30/19      PT LONG TERM GOAL #2   Title Pt will be able to lie on Rt hip at night without increased pain    Time 8    Period Weeks    Status New    Target Date 11/30/19      PT LONG TERM GOAL #3   Title FOTO score will improve to 45% to demo improved functional mobility    Time 8    Period Weeks    Status New    Target Date 11/30/19      PT LONG TERM GOAL #4   Title Patient will increase hip AROM in sitting to be able to put Rt foot on Lt thigh.    Time 8    Period Weeks    Status New    Target Date 11/30/19      PT LONG TERM GOAL #5   Title TUG, DGI goals TBA    Time 8    Period Weeks    Status New    Target Date 11/30/19                  Plan  - 10/06/19 0755    Clinical Impression Statement Patient presents for low complexity eval of Rt sided hip pain which is chronic.  She is familiar to me from previous episodes of PT.  After 2 cortisone shots this year she is noticing increased pain and stiffness.  She is also motivated to return to PIlates as it offers her stretching and support for strengthening, balance and coordination that may otherwise be a challenge due to her  deficits.  Dry needling has helped her in the past.  She will benefit from PT to positively impact her mobility and reduce pain and tension in Rt LE.    Personal Factors and Comorbidities Comorbidity 3+;Past/Current Experience;Time since onset of injury/illness/exacerbation    Comorbidities cervical myelopathy with fusion, ataxia, chronic pain    Examination-Activity Limitations Dressing;Transfers;Sleep;Bend;Lift;Squat;Locomotion Level;Stand;Reach Overhead    Examination-Participation Restrictions Laundry;Cleaning;Meal Prep;Community Activity;Interpersonal Relationship    Stability/Clinical Decision Making Stable/Uncomplicated    Clinical Decision Making Low    Rehab Potential Good    PT Frequency 2x / week    PT Duration 8 weeks    PT Treatment/Interventions ADLs/Self Care Home Management;Moist Heat;Gait training;Stair training;Balance training;Therapeutic activities;Patient/family education;Therapeutic exercise;Ultrasound;Functional mobility training;Dry needling;Manual techniques;Passive range of motion;Neuromuscular re-education    PT Next Visit Plan dry needling Rt hip, balance/TUG, Pilates    PT Home Exercise Plan has previous, none new    Consulted and Agree with Plan of Care Patient           Patient will benefit from skilled therapeutic intervention in order to improve the following deficits and impairments:  Abnormal gait, Decreased balance, Decreased endurance, Decreased mobility, Difficulty walking, Obesity, Decreased range of motion, Decreased strength,  Increased fascial restricitons, Impaired flexibility, Impaired UE functional use, Postural dysfunction, Pain  Visit Diagnosis: Pain in right hip  Stiffness of right hip, not elsewhere classified     Problem List Patient Active Problem List   Diagnosis Date Noted  . OSA (obstructive sleep apnea) 10/16/2017  . Cervical stenosis of spinal canal 10/23/2016  . Neck swelling 09/28/2011  . Weight gain 01/02/2011  . Carpal tunnel syndrome 11/02/2010  . Leg swelling 08/26/2010  . ADRENAL MASS, LEFT 05/23/2010  . SPINAL STENOSIS, LUMBAR 12/27/2009  . B12 DEFICIENCY 06/10/2009  . ANEMIA 06/10/2009  . BACK PAIN, LUMBAR 11/06/2008  . KNEE PAIN, LEFT 02/03/2008  . DIABETES MELLITUS, TYPE II, UNCONTROLLED 01/12/2008  . MENOPAUSE-RELATED VASOMOTOR SYMPTOMS, HOT FLASHES 06/23/2007  . BURSITIS, LEFT HIP 06/23/2007  . HYPERLIPIDEMIA 03/25/2007  . ANXIETY 03/25/2007  . DEPRESSION 03/25/2007  . PERIPHERAL NEUROPATHY 03/25/2007  . HYPERTENSION 03/25/2007  . CORONARY ARTERY DISEASE 03/25/2007  . DIVERTICULOSIS, COLON 03/25/2007  . TUBULOVILLOUS ADENOMA, COLON 02/18/2007    [name] Bean 10/06/2019, 11:51 AM  [facility] [address] [place], [name], [address] Phone: [phone]   Fax:  [phone]  Name: [name] Bean MRN: [id number] Date of Birth: 1951/01/01  Raeford Razor, PT 10/06/19 11:52 AM Phone: [phone] Fax: [phone]

## 2019-10-17 ENCOUNTER — Ambulatory Visit: Payer: Medicare PPO | Admitting: Obstetrics and Gynecology

## 2019-10-20 ENCOUNTER — Other Ambulatory Visit: Payer: Self-pay

## 2019-10-20 ENCOUNTER — Ambulatory Visit: Payer: Medicare PPO | Attending: Neurosurgery | Admitting: Physical Therapy

## 2019-10-20 ENCOUNTER — Encounter: Payer: Self-pay | Admitting: Physical Therapy

## 2019-10-20 DIAGNOSIS — M25651 Stiffness of right hip, not elsewhere classified: Secondary | ICD-10-CM | POA: Diagnosis not present

## 2019-10-20 DIAGNOSIS — M542 Cervicalgia: Secondary | ICD-10-CM | POA: Diagnosis not present

## 2019-10-20 DIAGNOSIS — M6281 Muscle weakness (generalized): Secondary | ICD-10-CM | POA: Diagnosis not present

## 2019-10-20 DIAGNOSIS — M25551 Pain in right hip: Secondary | ICD-10-CM | POA: Diagnosis not present

## 2019-10-20 DIAGNOSIS — R2689 Other abnormalities of gait and mobility: Secondary | ICD-10-CM | POA: Diagnosis not present

## 2019-10-20 NOTE — Therapy (Signed)
[place], [name], [address] Phone: [phone]   Fax:  [phone]  Physical Therapy Treatment  Patient Details  Name: [name] Bean MRN: [id number] Date of Birth: [birth year] Referring Provider (PT): Dr. Ashok Pall    Encounter Date: 10/20/2019   PT End of Session - 10/20/19 0852    Visit Number 2    Number of Visits 16    Date for PT Re-Evaluation 11/30/19    Authorization Type UHC MCR    PT Start Time 0832    PT Stop Time 0920    PT Time Calculation (min) 48 min    Activity Tolerance Patient tolerated treatment well    Behavior During Therapy [facility] for tasks assessed/performed           Past Medical History:  Diagnosis Date  . Anemia   . Anxiety   . Arthritis   . Asthma   . Broken arm    left arm  . Bronchitis   . Bursitis of left hip   . CAD (coronary artery disease)   . Candidiasis, vagina   . Cardiac arrest ([place]) 06/20/2016   at [facility]; ? due to flash pulm edema vs undertreated chronic CHF  . Cerumen impaction   . CHF (congestive heart failure) ([place])    RECENT ADMIT TO [facility]   . Colon, diverticulosis   . Depression   . Diabetes mellitus    15 YRS AGO  . Fatigue   . Gastroenteritis   . Hot flashes, menopausal   . Hyperlipidemia   . Knee pain, left   . Lumbar back pain   . Maxillary sinusitis    history  . Muscle tear    right gluteus  . Nausea   . Nicotine dependence   . OSA (obstructive sleep apnea) 10/16/2017  . Other B-complex deficiencies   . Otitis media, acute    left  . Peripheral neuropathy   . Spinal stenosis of lumbar region   . Tubulovillous adenoma of colon   . Unspecified essential hypertension     Past Surgical History:  Procedure Laterality Date  . CARDIAC CATHETERIZATION  2015, 2009, 2006  . CERVICAL SPINE SURGERY  2003  . CORONARY ANGIOPLASTY WITH STENT PLACEMENT  2002, 2015  . DILATATION & CURETTAGE/HYSTEROSCOPY WITH MYOSURE N/A 10/03/2019    Procedure: DILATATION & CURETTAGE/HYSTEROSCOPY WITH MYOSURE;  Surgeon: [name], MD;  Location: [place];  Service: Gynecology;  Laterality: N/A;  request 7:30am OR time in [facility] Gynecology requests 30 minutes OR time  . EYE SURGERY     cataracts bilaterally  . POSTERIOR CERVICAL LAMINECTOMY N/A 10/23/2016   Procedure: POSTERIOR CERVICAL LAMINECTOMY MULTI LEVEL CERVICAL TWO- CERVICAL THREE, CERVICAL THREE- CERVICAL FOUR;  Surgeon: Ashok Pall, MD;  Location: [place];  Service: Neurosurgery;  Laterality: N/A;  POSTERIOR    There were no vitals filed for this visit.   Subjective Assessment - 10/20/19 0837    Subjective Patient reports Rt hip soreness, has already done her stretches this AM.    Currently in Pain? No/denies            [facility] Adult PT Treatment/Exercise - 10/20/19 0001      Timed Up and Go Test   Normal TUG (seconds) 14.1      Lumbar Exercises: Aerobic   Nustep 6 min L5 UE and LE       Manual Therapy   Soft tissue mobilization Rt glute medius, piriformis and pressure  to Rt ischial tuberosity attachments              Pilates Reformer used for LE/core strength, postural strength, lumbopelvic disassociation and core control.  Exercises included:  Footwork 2 red 1 blue parallel, hip ER/IR  X 20 each with slow pace  Hip ER/IR stretching with wide knees and trunk rotation   Feet in straps 1 Red 1 Yellow Arcs in parallel x 15  Squat x 15 in parallel and x 15 hips open   Circles in parallel x 10 each direction  Pt requires mod A for getting on and off reformer as well as getting feet in straps.      PT Short Term Goals - 10/05/19 1734      PT SHORT TERM GOAL #1   Title Pt will complete balance re-assessment and understand results, set goal for this episode.    Time 4    Period Weeks    Status New    Target Date 11/02/19      PT SHORT TERM GOAL #2   Title Pt will be I with simple HEP for hip, core and balance    Time 4     Period Weeks    Status New    Target Date 11/02/19             PT Long Term Goals - 10/05/19 1737      PT LONG TERM GOAL #1   Title Pt will be able to negotiate 8 steps with 1 rail and improved confidence, less dependence on UEs, reciprocal pattern.    Time 8    Period Weeks    Status New    Target Date 11/30/19      PT LONG TERM GOAL #2   Title Pt will be able to lie on Rt hip at night without increased pain    Time 8    Period Weeks    Status New    Target Date 11/30/19      PT LONG TERM GOAL #3   Title FOTO score will improve to 45% to demo improved functional mobility    Time 8    Period Weeks    Status New    Target Date 11/30/19      PT LONG TERM GOAL #4   Title Patient will increase hip AROM in sitting to be able to put Rt foot on Lt thigh.    Time 8    Period Weeks    Status New    Target Date 11/30/19      PT LONG TERM GOAL #5   Title TUG, DGI goals TBA    Time 8    Period Weeks    Status New    Target Date 11/30/19                 Plan - 10/20/19 0905    Clinical Impression Statement Initiated Pilates for reducing tension in hips , providing cues to mimimize hip external rotation and core control.  Manual to Rt hip to reduce soreness, triger point work.  No pain post session.    PT Treatment/Interventions ADLs/Self Care Home Management;Moist Heat;Gait training;Stair training;Balance training;Therapeutic activities;Patient/family education;Therapeutic exercise;Ultrasound;Functional mobility training;Dry needling;Manual techniques;Passive range of motion;Neuromuscular re-education    PT Next Visit Plan dry needling Rt hip, Pilates and hip flexibility. Reveiw HEP from previous episodes    PT Home Exercise Plan has previous, none new    Consulted and Agree with Plan of Care Patient  Patient will benefit from skilled therapeutic intervention in order to improve the following deficits and impairments:  Abnormal gait, Decreased balance,  Decreased endurance, Decreased mobility, Difficulty walking, Obesity, Decreased range of motion, Decreased strength, Increased fascial restricitons, Impaired flexibility, Impaired UE functional use, Postural dysfunction, Pain  Visit Diagnosis: Pain in right hip  Stiffness of right hip, not elsewhere classified  Cervicalgia  Other abnormalities of gait and mobility  Muscle weakness (generalized)     Problem List Patient Active Problem List   Diagnosis Date Noted  . OSA (obstructive sleep apnea) 10/16/2017  . Cervical stenosis of spinal canal 10/23/2016  . Neck swelling 09/28/2011  . Weight gain 01/02/2011  . Carpal tunnel syndrome 11/02/2010  . Leg swelling 08/26/2010  . ADRENAL MASS, LEFT 05/23/2010  . SPINAL STENOSIS, LUMBAR 12/27/2009  . B12 DEFICIENCY 06/10/2009  . ANEMIA 06/10/2009  . BACK PAIN, LUMBAR 11/06/2008  . KNEE PAIN, LEFT 02/03/2008  . DIABETES MELLITUS, TYPE II, UNCONTROLLED 01/12/2008  . MENOPAUSE-RELATED VASOMOTOR SYMPTOMS, HOT FLASHES 06/23/2007  . BURSITIS, LEFT HIP 06/23/2007  . HYPERLIPIDEMIA 03/25/2007  . ANXIETY 03/25/2007  . DEPRESSION 03/25/2007  . PERIPHERAL NEUROPATHY 03/25/2007  . HYPERTENSION 03/25/2007  . CORONARY ARTERY DISEASE 03/25/2007  . DIVERTICULOSIS, COLON 03/25/2007  . TUBULOVILLOUS ADENOMA, COLON 02/18/2007    Mat Stuard 10/20/2019, 11:15 AM  [facility] [address] [place], [name], [address] Phone: [phone]   Fax:  [phone]  Name: [name] Bean MRN: [id number] Date of Birth: July 12, 1950  Raeford Razor, PT 10/20/19 11:18 AM Phone: [phone] Fax: [phone]

## 2019-10-24 ENCOUNTER — Ambulatory Visit: Payer: Medicare PPO | Admitting: Physical Therapy

## 2019-10-24 ENCOUNTER — Other Ambulatory Visit: Payer: Self-pay

## 2019-10-24 DIAGNOSIS — M25551 Pain in right hip: Secondary | ICD-10-CM | POA: Diagnosis not present

## 2019-10-24 DIAGNOSIS — M25651 Stiffness of right hip, not elsewhere classified: Secondary | ICD-10-CM

## 2019-10-24 DIAGNOSIS — M542 Cervicalgia: Secondary | ICD-10-CM | POA: Diagnosis not present

## 2019-10-24 DIAGNOSIS — R2689 Other abnormalities of gait and mobility: Secondary | ICD-10-CM | POA: Diagnosis not present

## 2019-10-24 DIAGNOSIS — M6281 Muscle weakness (generalized): Secondary | ICD-10-CM | POA: Diagnosis not present

## 2019-10-24 NOTE — Therapy (Signed)
[place], [name], [address] Phone: [phone]   Fax:  [phone]  Physical Therapy Treatment  Patient Details  Name: [name] Bean MRN: [id number] Date of Birth: 1951-04-10 Referring Provider (PT): Dr. Ashok Pall    Encounter Date: 10/24/2019   PT End of Session - 10/24/19 1412    Visit Number 3    Number of Visits 16    Date for PT Re-Evaluation 11/30/19    Authorization Type UHC MCR    PT Start Time 3818    PT Stop Time 1445    PT Time Calculation (min) 40 min    Activity Tolerance Patient tolerated treatment well    Behavior During Therapy [facility] for tasks assessed/performed           Past Medical History:  Diagnosis Date  . Anemia   . Anxiety   . Arthritis   . Asthma   . Broken arm    left arm  . Bronchitis   . Bursitis of left hip   . CAD (coronary artery disease)   . Candidiasis, vagina   . Cardiac arrest ([place]) 06/20/2016   at [facility]; ? due to flash pulm edema vs undertreated chronic CHF  . Cerumen impaction   . CHF (congestive heart failure) ([place])    RECENT ADMIT TO [facility]   . Colon, diverticulosis   . Depression   . Diabetes mellitus    15 YRS AGO  . Fatigue   . Gastroenteritis   . Hot flashes, menopausal   . Hyperlipidemia   . Knee pain, left   . Lumbar back pain   . Maxillary sinusitis    history  . Muscle tear    right gluteus  . Nausea   . Nicotine dependence   . OSA (obstructive sleep apnea) 10/16/2017  . Other B-complex deficiencies   . Otitis media, acute    left  . Peripheral neuropathy   . Spinal stenosis of lumbar region   . Tubulovillous adenoma of colon   . Unspecified essential hypertension     Past Surgical History:  Procedure Laterality Date  . CARDIAC CATHETERIZATION  2015, 2009, 2006  . CERVICAL SPINE SURGERY  2003  . CORONARY ANGIOPLASTY WITH STENT PLACEMENT  2002, 2015  . DILATATION & CURETTAGE/HYSTEROSCOPY WITH MYOSURE N/A 10/03/2019    Procedure: DILATATION & CURETTAGE/HYSTEROSCOPY WITH MYOSURE;  Surgeon: [name], MD;  Location: [place];  Service: Gynecology;  Laterality: N/A;  request 7:30am OR time in [facility] Gynecology requests 30 minutes OR time  . EYE SURGERY     cataracts bilaterally  . POSTERIOR CERVICAL LAMINECTOMY N/A 10/23/2016   Procedure: POSTERIOR CERVICAL LAMINECTOMY MULTI LEVEL CERVICAL TWO- CERVICAL THREE, CERVICAL THREE- CERVICAL FOUR;  Surgeon: Ashok Pall, MD;  Location: [place];  Service: Neurosurgery;  Laterality: N/A;  POSTERIOR    There were no vitals filed for this visit.   Subjective Assessment - 10/24/19 1412    Subjective Rt hip is actually good, just a little tight.    Currently in Pain? No/denies              PT Education - 10/24/19 1639    Education Details hip flexor stretching, POC    Person(s) Educated Patient    Methods Explanation    Comprehension Verbalized understanding          Pilates Reformer used for LE/core strength, postural strength, lumbopelvic disassociation and core control.  Exercises included:  Jumpboard for footwork  Footwork 2 Red 1 blue parallel and wide/turnout x 20 each , used ball for 10 reps for adduction   Feet in Straps 1 Red hamstring and lateral hip, outer hip stretch  60 sec hold static with PT assist each plane  Hip flexor stretch at the same time   Sidelying footwork 1 red 1 blue x 20   Prone on long box pulling straps x 10 blue  Mod A for getting on box, unable to use step to get on    PT Short Term Goals - 10/05/19 1734      PT SHORT TERM GOAL #1   Title Pt will complete balance re-assessment and understand results, set goal for this episode.    Time 4    Period Weeks    Status New    Target Date 11/02/19      PT SHORT TERM GOAL #2   Title Pt will be I with simple HEP for hip, core and balance    Time 4    Period Weeks    Status New    Target Date 11/02/19             PT Long Term Goals -  10/05/19 1737      PT LONG TERM GOAL #1   Title Pt will be able to negotiate 8 steps with 1 rail and improved confidence, less dependence on UEs, reciprocal pattern.    Time 8    Period Weeks    Status New    Target Date 11/30/19      PT LONG TERM GOAL #2   Title Pt will be able to lie on Rt hip at night without increased pain    Time 8    Period Weeks    Status New    Target Date 11/30/19      PT LONG TERM GOAL #3   Title FOTO score will improve to 45% to demo improved functional mobility    Time 8    Period Weeks    Status New    Target Date 11/30/19      PT LONG TERM GOAL #4   Title Patient will increase hip AROM in sitting to be able to put Rt foot on Lt thigh.    Time 8    Period Weeks    Status New    Target Date 11/30/19      PT LONG TERM GOAL #5   Title TUG, DGI goals TBA    Time 8    Period Weeks    Status New    Target Date 11/30/19                 Plan - 10/24/19 1710    Clinical Impression Statement Pt with improved tension in R hip today. Able to use Reformer , mostly for hip mobility.  Pt needed mod A to get on Long box in prone.  Next session will focus on re-establishing home program and baseline balance.    PT Treatment/Interventions ADLs/Self Care Home Management;Moist Heat;Gait training;Stair training;Balance training;Therapeutic activities;Patient/family education;Therapeutic exercise;Ultrasound;Functional mobility training;Dry needling;Manual techniques;Passive range of motion;Neuromuscular re-education    PT Next Visit Plan dry needling Rt hip, HEP and berg vs DGI    PT Home Exercise Plan has previous, none new    Consulted and Agree with Plan of Care Patient           Patient will benefit from skilled therapeutic intervention in order to improve the following deficits and impairments:  Abnormal gait, Decreased balance, Decreased endurance, Decreased mobility, Difficulty walking, Obesity, Decreased range of motion, Decreased strength,  Increased fascial restricitons, Impaired flexibility, Impaired UE functional use, Postural dysfunction, Pain  Visit Diagnosis: Pain in right hip  Stiffness of right hip, not elsewhere classified     Problem List Patient Active Problem List   Diagnosis Date Noted  . OSA (obstructive sleep apnea) 10/16/2017  . Cervical stenosis of spinal canal 10/23/2016  . Neck swelling 09/28/2011  . Weight gain 01/02/2011  . Carpal tunnel syndrome 11/02/2010  . Leg swelling 08/26/2010  . ADRENAL MASS, LEFT 05/23/2010  . SPINAL STENOSIS, LUMBAR 12/27/2009  . B12 DEFICIENCY 06/10/2009  . ANEMIA 06/10/2009  . BACK PAIN, LUMBAR 11/06/2008  . KNEE PAIN, LEFT 02/03/2008  . DIABETES MELLITUS, TYPE II, UNCONTROLLED 01/12/2008  . MENOPAUSE-RELATED VASOMOTOR SYMPTOMS, HOT FLASHES 06/23/2007  . BURSITIS, LEFT HIP 06/23/2007  . HYPERLIPIDEMIA 03/25/2007  . ANXIETY 03/25/2007  . DEPRESSION 03/25/2007  . PERIPHERAL NEUROPATHY 03/25/2007  . HYPERTENSION 03/25/2007  . CORONARY ARTERY DISEASE 03/25/2007  . DIVERTICULOSIS, COLON 03/25/2007  . TUBULOVILLOUS ADENOMA, COLON 02/18/2007    Elaina Cara 10/24/2019, 5:19 PM  [facility] [address] [place], [name], [address] Phone: [phone]   Fax:  [phone]  Name: [name] Bean MRN: [id number] Date of Birth: Apr 19, 1950  Raeford Razor, PT 10/24/19 5:20 PM Phone: [phone] Fax: [phone]

## 2019-10-25 ENCOUNTER — Ambulatory Visit (INDEPENDENT_AMBULATORY_CARE_PROVIDER_SITE_OTHER): Payer: Medicare PPO | Admitting: Obstetrics and Gynecology

## 2019-10-25 ENCOUNTER — Encounter: Payer: Self-pay | Admitting: Obstetrics and Gynecology

## 2019-10-25 VITALS — BP 134/74

## 2019-10-25 DIAGNOSIS — N950 Postmenopausal bleeding: Secondary | ICD-10-CM | POA: Diagnosis not present

## 2019-10-25 DIAGNOSIS — N840 Polyp of corpus uteri: Secondary | ICD-10-CM

## 2019-10-25 DIAGNOSIS — Z9889 Other specified postprocedural states: Secondary | ICD-10-CM

## 2019-10-25 NOTE — Progress Notes (Signed)
   [name] Bean [facility] 1950-10-06 [id number]    SUBJECTIVE   REASON FOR APPOINTMENT Chief Complaint  Patient presents with  . Post Op check     HISTORY OF PRESENT ILLNESS [name] Bean presents for routine post-operative follow up after a hysteroscopy, dilation and curettage, Myosure polypectomy and adhesiolysis performed on 10/03/2019 for postmenopausal bleeding and suspected endometrial polyp.  The patient is doing well with no concerns.  She denies vaginal bleeding or discharge. She is tolerating normal diet.  Bowel and bladder function are normal.  OBJECTIVE  BP 134/74    PHYSICAL EXAM General:  Patient in no acute distress.   ASSESSMENT / PLAN  Routine 2 week post operative check.   The patient is doing well and meeting all postoperative milestones.  I reviewed the benign pathology report describing the endometrial tissue and benign polyp.  I showed her some pictures taken during the hysteroscopy.  She may return to normal activities without restriction and resume normal gynecologic care at this point.   [name], M.D.    [name] MD 10/25/19

## 2019-10-26 ENCOUNTER — Ambulatory Visit: Payer: Medicare PPO | Admitting: Physical Therapy

## 2019-10-31 ENCOUNTER — Encounter: Payer: Self-pay | Admitting: Physical Therapy

## 2019-10-31 ENCOUNTER — Ambulatory Visit: Payer: Medicare PPO | Admitting: Physical Therapy

## 2019-10-31 ENCOUNTER — Other Ambulatory Visit: Payer: Self-pay

## 2019-10-31 DIAGNOSIS — M25551 Pain in right hip: Secondary | ICD-10-CM | POA: Diagnosis not present

## 2019-10-31 DIAGNOSIS — M25651 Stiffness of right hip, not elsewhere classified: Secondary | ICD-10-CM

## 2019-10-31 DIAGNOSIS — M6281 Muscle weakness (generalized): Secondary | ICD-10-CM | POA: Diagnosis not present

## 2019-10-31 DIAGNOSIS — R2689 Other abnormalities of gait and mobility: Secondary | ICD-10-CM | POA: Diagnosis not present

## 2019-10-31 DIAGNOSIS — M542 Cervicalgia: Secondary | ICD-10-CM | POA: Diagnosis not present

## 2019-10-31 NOTE — Therapy (Signed)
[place], [name], [address] Phone: [phone]   Fax:  [phone]  Physical Therapy Treatment  Patient Details  Name: [name] Bean MRN: [id number] Date of Birth: Oct 06, 1950 Referring Provider (PT): Dr. Ashok Pall    Encounter Date: 10/31/2019   PT End of Session - 10/31/19 1516    Visit Number 4    Number of Visits 16    Date for PT Re-Evaluation 11/30/19    Authorization Type UHC MCR    PT Start Time 0998    PT Stop Time 3382   request to end early   PT Time Calculation (min) 32 min    Activity Tolerance Patient tolerated treatment well    Behavior During Therapy [facility] for tasks assessed/performed           Past Medical History:  Diagnosis Date  . Anemia   . Anxiety   . Arthritis   . Asthma   . Broken arm    left arm  . Bronchitis   . Bursitis of left hip   . CAD (coronary artery disease)   . Candidiasis, vagina   . Cardiac arrest ([place]) 06/20/2016   at [facility]; ? due to flash pulm edema vs undertreated chronic CHF  . Cerumen impaction   . CHF (congestive heart failure) ([place])    RECENT ADMIT TO [facility]   . Colon, diverticulosis   . Depression   . Diabetes mellitus    15 YRS AGO  . Fatigue   . Gastroenteritis   . Hot flashes, menopausal   . Hyperlipidemia   . Knee pain, left   . Lumbar back pain   . Maxillary sinusitis    history  . Muscle tear    right gluteus  . Nausea   . Nicotine dependence   . OSA (obstructive sleep apnea) 10/16/2017  . Other B-complex deficiencies   . Otitis media, acute    left  . Peripheral neuropathy   . Spinal stenosis of lumbar region   . Tubulovillous adenoma of colon   . Unspecified essential hypertension     Past Surgical History:  Procedure Laterality Date  . CARDIAC CATHETERIZATION  2015, 2009, 2006  . CERVICAL SPINE SURGERY  2003  . CORONARY ANGIOPLASTY WITH STENT PLACEMENT  2002, 2015  . DILATATION & CURETTAGE/HYSTEROSCOPY WITH  MYOSURE N/A 10/03/2019   Procedure: DILATATION & CURETTAGE/HYSTEROSCOPY WITH MYOSURE;  Surgeon: [name], MD;  Location: [place];  Service: Gynecology;  Laterality: N/A;  request 7:30am OR time in [facility] Gynecology requests 30 minutes OR time  . EYE SURGERY     cataracts bilaterally  . POSTERIOR CERVICAL LAMINECTOMY N/A 10/23/2016   Procedure: POSTERIOR CERVICAL LAMINECTOMY MULTI LEVEL CERVICAL TWO- CERVICAL THREE, CERVICAL THREE- CERVICAL FOUR;  Surgeon: Ashok Pall, MD;  Location: [place];  Service: Neurosurgery;  Laterality: N/A;  POSTERIOR    There were no vitals filed for this visit.   Subjective Assessment - 10/31/19 1450    Subjective Had had increased over the past week.  The past couple nights it has really kept me up.  Had to miss session due to GI issues.    Currently in Pain? Yes    Pain Score 7     Pain Location Hip    Pain Orientation Right;Lateral;Posterior    Pain Descriptors / Indicators Aching;Tightness    Pain Type Chronic pain    Pain Onset More than a month ago    Pain Frequency Intermittent  Aggravating Factors  damp weather, neurofactor    Pain Relieving Factors heat, massage and stretching                             [facility] Adult PT Treatment/Exercise - 10/31/19 0001      Lumbar Exercises: Stretches   Active Hamstring Stretch Limitations standing modified down dog x 5 used ball for ease on high mat table     Lower Trunk Rotation 10 seconds    Lower Trunk Rotation Limitations x10 head turns     Other Lumbar Stretch Exercise wide knees with rotation and hip IR/ER       Knee/Hip Exercises: Stretches   Active Hamstring Stretch Right;Left;2 reps;30 seconds    Hip Flexor Stretch Both;3 reps    Hip Flexor Stretch Limitations standing     ITB Stretch Both;3 reps    Piriformis Stretch Both;5 reps;30 seconds    Piriformis Stretch Limitations 2 ways added trunk rotation     Other Knee/Hip Stretches supine butterflly  x 60 sec     Other Knee/Hip Stretches side lunge adductors 3 x 30 sec                     PT Short Term Goals - 10/05/19 1734      PT SHORT TERM GOAL #1   Title Pt will complete balance re-assessment and understand results, set goal for this episode.    Time 4    Period Weeks    Status New    Target Date 11/02/19      PT SHORT TERM GOAL #2   Title Pt will be I with simple HEP for hip, core and balance    Time 4    Period Weeks    Status New    Target Date 11/02/19             PT Long Term Goals - 10/05/19 1737      PT LONG TERM GOAL #1   Title Pt will be able to negotiate 8 steps with 1 rail and improved confidence, less dependence on UEs, reciprocal pattern.    Time 8    Period Weeks    Status New    Target Date 11/30/19      PT LONG TERM GOAL #2   Title Pt will be able to lie on Rt hip at night without increased pain    Time 8    Period Weeks    Status New    Target Date 11/30/19      PT LONG TERM GOAL #3   Title FOTO score will improve to 45% to demo improved functional mobility    Time 8    Period Weeks    Status New    Target Date 11/30/19      PT LONG TERM GOAL #4   Title Patient will increase hip AROM in sitting to be able to put Rt foot on Lt thigh.    Time 8    Period Weeks    Status New    Target Date 11/30/19      PT LONG TERM GOAL #5   Title TUG, DGI goals TBA    Time 8    Period Weeks    Status New    Target Date 11/30/19                 Plan - 10/31/19 1519    Clinical Impression  Statement Patient with increased pain in hip today. She was able to reduce her hip pain with several stretches, reissued HEP for home management.  She requested to end session a bit early as she was tired.    PT Treatment/Interventions ADLs/Self Care Home Management;Moist Heat;Gait training;Stair training;Balance training;Therapeutic activities;Patient/family education;Therapeutic exercise;Ultrasound;Functional mobility training;Dry  needling;Manual techniques;Passive range of motion;Neuromuscular re-education    PT Next Visit Plan dry needling Rt hip, HEP and berg vs DGI    PT Home Exercise Plan [email or web address]: hip stretching all muscle groups    Consulted and Agree with Plan of Care Patient           Patient will benefit from skilled therapeutic intervention in order to improve the following deficits and impairments:  Abnormal gait, Decreased balance, Decreased endurance, Decreased mobility, Difficulty walking, Obesity, Decreased range of motion, Decreased strength, Increased fascial restricitons, Impaired flexibility, Impaired UE functional use, Postural dysfunction, Pain  Visit Diagnosis: Pain in right hip  Stiffness of right hip, not elsewhere classified     Problem List Patient Active Problem List   Diagnosis Date Noted  . OSA (obstructive sleep apnea) 10/16/2017  . Cervical stenosis of spinal canal 10/23/2016  . Neck swelling 09/28/2011  . Weight gain 01/02/2011  . Carpal tunnel syndrome 11/02/2010  . Leg swelling 08/26/2010  . ADRENAL MASS, LEFT 05/23/2010  . SPINAL STENOSIS, LUMBAR 12/27/2009  . B12 DEFICIENCY 06/10/2009  . ANEMIA 06/10/2009  . BACK PAIN, LUMBAR 11/06/2008  . KNEE PAIN, LEFT 02/03/2008  . DIABETES MELLITUS, TYPE II, UNCONTROLLED 01/12/2008  . MENOPAUSE-RELATED VASOMOTOR SYMPTOMS, HOT FLASHES 06/23/2007  . BURSITIS, LEFT HIP 06/23/2007  . HYPERLIPIDEMIA 03/25/2007  . ANXIETY 03/25/2007  . DEPRESSION 03/25/2007  . PERIPHERAL NEUROPATHY 03/25/2007  . HYPERTENSION 03/25/2007  . CORONARY ARTERY DISEASE 03/25/2007  . DIVERTICULOSIS, COLON 03/25/2007  . TUBULOVILLOUS ADENOMA, COLON 02/18/2007    Kelsi Benham 10/31/2019, 3:29 PM  [facility] [address] [place], [name], [address] Phone: [phone]   Fax:  [phone]  Name: Iona Stay MRN: [id number] Date of Birth: 1950-11-12  Raeford Razor, PT 10/31/19 3:29  PM Phone: [phone] Fax: [phone]

## 2019-10-31 NOTE — Patient Instructions (Signed)
Access Code: [email or web address] URL: [email or web address] Date: 10/31/2019 Prepared by: Raeford Razor  Exercises Standing Hip Flexor Stretch - 2 x daily - 7 x weekly - 1 sets - 3 reps - 30 hold Side Lunge Adductor Stretch - 2 x daily - 7 x weekly - 1 sets - 3 reps - 30 hold Seated Table Piriformis Stretch - 2 x daily - 7 x weekly - 1 sets - 3 reps - 30 hold Supine Lower Trunk Rotation - 2 x daily - 7 x weekly - 1 sets - 10 reps - 10 hold Supine Butterfly Groin Stretch - 2 x daily - 7 x weekly - 1 sets - 3 reps - 30 hold Supine Hamstring Stretch with Strap - 2 x daily - 7 x weekly - 2 sets - 3 reps - 30 hold Supine ITB Stretch with Strap - 2 x daily - 7 x weekly - 2 sets - 3 reps - 30 hold

## 2019-11-02 ENCOUNTER — Ambulatory Visit: Payer: Medicare PPO | Admitting: Physical Therapy

## 2019-11-02 ENCOUNTER — Encounter: Payer: Self-pay | Admitting: Physical Therapy

## 2019-11-02 ENCOUNTER — Other Ambulatory Visit: Payer: Self-pay

## 2019-11-02 DIAGNOSIS — M6281 Muscle weakness (generalized): Secondary | ICD-10-CM | POA: Diagnosis not present

## 2019-11-02 DIAGNOSIS — M25651 Stiffness of right hip, not elsewhere classified: Secondary | ICD-10-CM

## 2019-11-02 DIAGNOSIS — M25551 Pain in right hip: Secondary | ICD-10-CM

## 2019-11-02 DIAGNOSIS — R2689 Other abnormalities of gait and mobility: Secondary | ICD-10-CM | POA: Diagnosis not present

## 2019-11-02 DIAGNOSIS — M542 Cervicalgia: Secondary | ICD-10-CM | POA: Diagnosis not present

## 2019-11-02 NOTE — Therapy (Signed)
[place], [name], [address] Phone: [phone]   Fax:  [phone]  Physical Therapy Treatment  Patient Details  Name: [name] Bean MRN: [id number] Date of Birth: 12/07/1950 Referring Provider (PT): Dr. Ashok Pall    Encounter Date: 11/02/2019   PT End of Session - 11/02/19 1425    Visit Number 5    Number of Visits 16    Date for PT Re-Evaluation 11/30/19    Authorization Type UHC MCR    PT Start Time 3825   pt. arrived late   PT Stop Time 1500   time spent dry needling not included in direct treatment minutes   PT Time Calculation (min) 36 min    Activity Tolerance Patient tolerated treatment well    Behavior During Therapy [facility] for tasks assessed/performed           Past Medical History:  Diagnosis Date  . Anemia   . Anxiety   . Arthritis   . Asthma   . Broken arm    left arm  . Bronchitis   . Bursitis of left hip   . CAD (coronary artery disease)   . Candidiasis, vagina   . Cardiac arrest ([place]) 06/20/2016   at [facility]; ? due to flash pulm edema vs undertreated chronic CHF  . Cerumen impaction   . CHF (congestive heart failure) ([place])    RECENT ADMIT TO [facility]   . Colon, diverticulosis   . Depression   . Diabetes mellitus    15 YRS AGO  . Fatigue   . Gastroenteritis   . Hot flashes, menopausal   . Hyperlipidemia   . Knee pain, left   . Lumbar back pain   . Maxillary sinusitis    history  . Muscle tear    right gluteus  . Nausea   . Nicotine dependence   . OSA (obstructive sleep apnea) 10/16/2017  . Other B-complex deficiencies   . Otitis media, acute    left  . Peripheral neuropathy   . Spinal stenosis of lumbar region   . Tubulovillous adenoma of colon   . Unspecified essential hypertension     Past Surgical History:  Procedure Laterality Date  . CARDIAC CATHETERIZATION  2015, 2009, 2006  . CERVICAL SPINE SURGERY  2003  . CORONARY ANGIOPLASTY WITH STENT  PLACEMENT  2002, 2015  . DILATATION & CURETTAGE/HYSTEROSCOPY WITH MYOSURE N/A 10/03/2019   Procedure: DILATATION & CURETTAGE/HYSTEROSCOPY WITH MYOSURE;  Surgeon: [name], MD;  Location: [place];  Service: Gynecology;  Laterality: N/A;  request 7:30am OR time in [facility] Gynecology requests 30 minutes OR time  . EYE SURGERY     cataracts bilaterally  . POSTERIOR CERVICAL LAMINECTOMY N/A 10/23/2016   Procedure: POSTERIOR CERVICAL LAMINECTOMY MULTI LEVEL CERVICAL TWO- CERVICAL THREE, CERVICAL THREE- CERVICAL FOUR;  Surgeon: Ashok Pall, MD;  Location: [place];  Service: Neurosurgery;  Laterality: N/A;  POSTERIOR    There were no vitals filed for this visit.   Subjective Assessment - 11/02/19 1504    Subjective Pt. brought quad cane today-reports has been walking without AD but was concerned over possible soreness and difficulty walking after dry needling today.    Pertinent History Jan 2020 CTS, cervical laminectomy/fusion, cardiac, diabetes, ataxia, falls                             [facility] Adult PT Treatment/Exercise - 11/02/19 0001  Standardized Balance Assessment   Standardized Balance Assessment Berg Balance Test      Berg Balance Test   Sit to Stand Able to stand without using hands and stabilize independently    Standing Unsupported Able to stand safely 2 minutes    Sitting with Back Unsupported but Feet Supported on Floor or Stool Able to sit safely and securely 2 minutes    Stand to Sit Sits safely with minimal use of hands    Transfers Able to transfer safely, minor use of hands    Standing Unsupported with Eyes Closed Able to stand 10 seconds with supervision    Standing Ubsupported with Feet Together Able to place feet together independently and stand 1 minute safely    From Standing, Reach Forward with Outstretched Arm Can reach forward >12 cm safely (5")    From Standing Position, Pick up Object from Floor Able to pick up  shoe, needs supervision    From Standing Position, Turn to Look Behind Over each Shoulder Looks behind one side only/other side shows less weight shift    Turn 360 Degrees Able to turn 360 degrees safely but slowly    Standing Unsupported, Alternately Place Feet on Step/Stool Able to stand independently and complete 8 steps >20 seconds    Standing Unsupported, One Foot in Front Able to plae foot ahead of the other independently and hold 30 seconds    Standing on One Leg Tries to lift leg/unable to hold 3 seconds but remains standing independently    Total Score 45      Neuro Re-ed    Neuro Re-ed Details  Berg balance components incuding SLS, tandem stance and toe taps      Lumbar Exercises: Stretches   Piriformis Stretch Right;3 reps;30 seconds    Figure 4 Stretch 3 reps;30 seconds    Figure 4 Stretch Limitations right side supine manual stretch      Manual Therapy   Soft tissue mobilization STM right gluteus medius and piriformis region            Trigger Point Dry Needling - 11/02/19 0001    Consent Given? Yes    Education Handout Provided --   verbal education, had handout with previous PT episode   Muscles Treated Back/Hip Gluteus minimus;Gluteus medius;Piriformis    Dry Needling Comments needling in left sidelying with pillow between knees with 30 gauge 50 mm needles                PT Education - 11/02/19 1511    Education Details Berg findings, dry needling, plan of care    Person(s) Educated Patient    Methods Explanation    Comprehension Verbalized understanding            PT Short Term Goals - 10/05/19 1734      PT SHORT TERM GOAL #1   Title Pt will complete balance re-assessment and understand results, set goal for this episode.    Time 4    Period Weeks    Status New    Target Date 11/02/19      PT SHORT TERM GOAL #2   Title Pt will be I with simple HEP for hip, core and balance    Time 4    Period Weeks    Status New    Target Date 11/02/19              PT Long Term Goals - 11/02/19 1434      PT LONG  TERM GOAL #1   Title Pt will be able to negotiate 8 steps with 1 rail and improved confidence, less dependence on UEs, reciprocal pattern.    Baseline can do this especially after PT, limited mostly by ataxia, tone in LEs    Time 8    Period Weeks    Status On-going      PT LONG TERM GOAL #2   Title Pt will be able to lie on Rt hip at night without increased pain    Baseline ongoing    Time 8    Period Weeks    Status On-going      PT LONG TERM GOAL #3   Title FOTO score will improve to 45% to demo improved functional mobility    Baseline ongoing    Time 8    Period Weeks    Status On-going      PT LONG TERM GOAL #4   Title Patient will increase hip AROM in sitting to be able to put Rt foot on Lt thigh.    Baseline see flowsheet, partially met    Time 8    Period Weeks    Status On-going      PT LONG TERM GOAL #5   Title Improve Berg Balance test score at least 3-5 points for decreased fall risk    Baseline Berg score: 45    Time 8    Period Weeks                 Plan - 11/02/19 1512    Clinical Impression Statement Checked Berg Balance test today with findings/score of 45/56 suggestive of increased fall risk. Tx. focus otherwise manual and stretches to right posterolateral hip with trial dry needling which was well-tolerated. LTGs updated to include Merrilee Jansky goal otherwise therapy goal progress ongoing.    Personal Factors and Comorbidities Comorbidity 3+;Past/Current Experience;Time since onset of injury/illness/exacerbation    Comorbidities cervical myelopathy with fusion, ataxia, chronic pain    Examination-Activity Limitations Dressing;Transfers;Sleep;Bend;Lift;Squat;Locomotion Level;Stand;Reach Overhead    Examination-Participation Restrictions Laundry;Cleaning;Meal Prep;Community Activity;Interpersonal Relationship    Stability/Clinical Decision Making Stable/Uncomplicated    Clinical Decision Making  Low    Rehab Potential Good    PT Frequency 2x / week    PT Duration 8 weeks    PT Treatment/Interventions ADLs/Self Care Home Management;Moist Heat;Gait training;Stair training;Balance training;Therapeutic activities;Patient/family education;Therapeutic exercise;Ultrasound;Functional mobility training;Dry needling;Manual techniques;Passive range of motion;Neuromuscular re-education    PT Next Visit Plan check response dry needling and continue as found beneficial, more focus hip strengthening exercises, balance/proprioceptive challenges    PT Home Exercise Plan [id number]: hip stretching all muscle groups    Consulted and Agree with Plan of Care Patient           Patient will benefit from skilled therapeutic intervention in order to improve the following deficits and impairments:  Abnormal gait, Decreased balance, Decreased endurance, Decreased mobility, Difficulty walking, Obesity, Decreased range of motion, Decreased strength, Increased fascial restricitons, Impaired flexibility, Impaired UE functional use, Postural dysfunction, Pain  Visit Diagnosis: Pain in right hip  Stiffness of right hip, not elsewhere classified     Problem List Patient Active Problem List   Diagnosis Date Noted  . OSA (obstructive sleep apnea) 10/16/2017  . Cervical stenosis of spinal canal 10/23/2016  . Neck swelling 09/28/2011  . Weight gain 01/02/2011  . Carpal tunnel syndrome 11/02/2010  . Leg swelling 08/26/2010  . ADRENAL MASS, LEFT 05/23/2010  . SPINAL STENOSIS, LUMBAR 12/27/2009  . B12 DEFICIENCY  06/10/2009  . ANEMIA 06/10/2009  . BACK PAIN, LUMBAR 11/06/2008  . KNEE PAIN, LEFT 02/03/2008  . DIABETES MELLITUS, TYPE II, UNCONTROLLED 01/12/2008  . MENOPAUSE-RELATED VASOMOTOR SYMPTOMS, HOT FLASHES 06/23/2007  . BURSITIS, LEFT HIP 06/23/2007  . HYPERLIPIDEMIA 03/25/2007  . ANXIETY 03/25/2007  . DEPRESSION 03/25/2007  . PERIPHERAL NEUROPATHY 03/25/2007  . HYPERTENSION 03/25/2007  . CORONARY  ARTERY DISEASE 03/25/2007  . DIVERTICULOSIS, COLON 03/25/2007  . TUBULOVILLOUS ADENOMA, COLON 02/18/2007    Beaulah Dinning, PT, DPT 11/02/19 3:21 PM  [facility] [address] [place], [name], [address] Phone: [phone]   Fax:  [phone]  Name: [name] Bean MRN: [id number] Date of Birth: 10/22/1950

## 2019-11-06 ENCOUNTER — Other Ambulatory Visit: Payer: Self-pay

## 2019-11-06 ENCOUNTER — Encounter: Payer: Self-pay | Admitting: Physical Therapy

## 2019-11-06 ENCOUNTER — Ambulatory Visit: Payer: Medicare PPO | Admitting: Physical Therapy

## 2019-11-06 DIAGNOSIS — M25551 Pain in right hip: Secondary | ICD-10-CM | POA: Diagnosis not present

## 2019-11-06 DIAGNOSIS — M25651 Stiffness of right hip, not elsewhere classified: Secondary | ICD-10-CM

## 2019-11-06 DIAGNOSIS — M542 Cervicalgia: Secondary | ICD-10-CM | POA: Diagnosis not present

## 2019-11-06 DIAGNOSIS — M6281 Muscle weakness (generalized): Secondary | ICD-10-CM | POA: Diagnosis not present

## 2019-11-06 DIAGNOSIS — R2689 Other abnormalities of gait and mobility: Secondary | ICD-10-CM | POA: Diagnosis not present

## 2019-11-06 NOTE — Therapy (Signed)
[place], [name], [address] Phone: [phone]   Fax:  [phone]  Physical Therapy Treatment  Patient Details  Name: [name] Bean MRN: [id number] Date of Birth: 05-27-1950 Referring Provider (PT): Dr. Ashok Pall    Encounter Date: 11/06/2019   PT End of Session - 11/06/19 1505    Visit Number 6    Number of Visits 16    Date for PT Re-Evaluation 11/30/19    Authorization Type UHC MCR    PT Start Time 1414    PT Stop Time 1502    PT Time Calculation (min) 48 min    Activity Tolerance Patient tolerated treatment well    Behavior During Therapy [facility] for tasks assessed/performed           Past Medical History:  Diagnosis Date  . Anemia   . Anxiety   . Arthritis   . Asthma   . Broken arm    left arm  . Bronchitis   . Bursitis of left hip   . CAD (coronary artery disease)   . Candidiasis, vagina   . Cardiac arrest ([place]) 06/20/2016   at [facility]; ? due to flash pulm edema vs undertreated chronic CHF  . Cerumen impaction   . CHF (congestive heart failure) ([place])    RECENT ADMIT TO [facility]   . Colon, diverticulosis   . Depression   . Diabetes mellitus    15 YRS AGO  . Fatigue   . Gastroenteritis   . Hot flashes, menopausal   . Hyperlipidemia   . Knee pain, left   . Lumbar back pain   . Maxillary sinusitis    history  . Muscle tear    right gluteus  . Nausea   . Nicotine dependence   . OSA (obstructive sleep apnea) 10/16/2017  . Other B-complex deficiencies   . Otitis media, acute    left  . Peripheral neuropathy   . Spinal stenosis of lumbar region   . Tubulovillous adenoma of colon   . Unspecified essential hypertension     Past Surgical History:  Procedure Laterality Date  . CARDIAC CATHETERIZATION  2015, 2009, 2006  . CERVICAL SPINE SURGERY  2003  . CORONARY ANGIOPLASTY WITH STENT PLACEMENT  2002, 2015  . DILATATION & CURETTAGE/HYSTEROSCOPY WITH MYOSURE N/A 10/03/2019    Procedure: DILATATION & CURETTAGE/HYSTEROSCOPY WITH MYOSURE;  Surgeon: [name], MD;  Location: [place];  Service: Gynecology;  Laterality: N/A;  request 7:30am OR time in [facility] Gynecology requests 30 minutes OR time  . EYE SURGERY     cataracts bilaterally  . POSTERIOR CERVICAL LAMINECTOMY N/A 10/23/2016   Procedure: POSTERIOR CERVICAL LAMINECTOMY MULTI LEVEL CERVICAL TWO- CERVICAL THREE, CERVICAL THREE- CERVICAL FOUR;  Surgeon: Ashok Pall, MD;  Location: [place];  Service: Neurosurgery;  Laterality: N/A;  POSTERIOR    There were no vitals filed for this visit.   Subjective Assessment - 11/06/19 1447    Subjective Pt. reports did well after last session up until Sunday when hip symptoms became exacerbated again without specific mechanism of onset noted.    Pertinent History Jan 2020 CTS, cervical laminectomy/fusion, cardiac, diabetes, ataxia, falls    Currently in Pain? Yes    Pain Location Hip    Pain Orientation Right;Lateral;Posterior    Pain Descriptors / Indicators Aching;Tightness    Pain Type Chronic pain    Pain Onset More than a month ago    Pain Frequency Intermittent  Aggravating Factors  weather, activity    Pain Relieving Factors heat, massage, stretching                             [facility] Adult PT Treatment/Exercise - 11/06/19 0001      Exercises   Exercises Knee/Hip      Knee/Hip Exercises: Stretches   ITB Stretch Right;3 reps;30 seconds    Piriformis Stretch Right;3 reps;30 seconds      Knee/Hip Exercises: Standing   Heel Raises Both;15 reps    Heel Raises Limitations Airex    Hip Abduction AROM;Stengthening;Both;15 reps    Abduction Limitations 3 lbs.    Lateral Step Up Right;Left;1 set;15 reps;Hand Hold: 2;Step Height: 4"    Other Standing Knee Exercises Romberg eyes closed 3x20 sec on Airex with CGA      Manual Therapy   Soft tissue mobilization STM right gluteus medius and piriformis region             Trigger Point Dry Needling - 11/06/19 0001    Consent Given? Yes    Muscles Treated Back/Hip Gluteus minimus;Gluteus medius;Piriformis;Tensor fascia lata    Dry Needling Comments needling in left sidelying with pillow between knees with 30 gauge 50 mm needles    Electrical Stimulation Performed with Dry Needling Yes    E-stim with Dry Needling Details TENS 20 pps x 10 minutes                  PT Short Term Goals - 10/05/19 1734      PT SHORT TERM GOAL #1   Title Pt will complete balance re-assessment and understand results, set goal for this episode.    Time 4    Period Weeks    Status New    Target Date 11/02/19      PT SHORT TERM GOAL #2   Title Pt will be I with simple HEP for hip, core and balance    Time 4    Period Weeks    Status New    Target Date 11/02/19             PT Long Term Goals - 11/02/19 1434      PT LONG TERM GOAL #1   Title Pt will be able to negotiate 8 steps with 1 rail and improved confidence, less dependence on UEs, reciprocal pattern.    Baseline can do this especially after PT, limited mostly by ataxia, tone in LEs    Time 8    Period Weeks    Status On-going      PT LONG TERM GOAL #2   Title Pt will be able to lie on Rt hip at night without increased pain    Baseline ongoing    Time 8    Period Weeks    Status On-going      PT LONG TERM GOAL #3   Title FOTO score will improve to 45% to demo improved functional mobility    Baseline ongoing    Time 8    Period Weeks    Status On-going      PT LONG TERM GOAL #4   Title Patient will increase hip AROM in sitting to be able to put Rt foot on Lt thigh.    Baseline see flowsheet, partially met    Time 8    Period Weeks    Status On-going      PT LONG TERM GOAL #5  Title Improve Berg Balance test score at least 3-5 points for decreased fall risk    Baseline Berg score: 45    Time 8    Period Weeks                 Plan - 11/06/19 1505    Clinical Impression  Statement Worked in brief hip strengthening in standing as well as brief balance work in addition to continued manual, stretches and performance of dry needling to right posterolateral hip region. Good response to last session but expect may take more time for more lasting/consistent symptom relief.    Personal Factors and Comorbidities Comorbidity 3+;Past/Current Experience;Time since onset of injury/illness/exacerbation    Comorbidities cervical myelopathy with fusion, ataxia, chronic pain    Examination-Activity Limitations Dressing;Transfers;Sleep;Bend;Lift;Squat;Locomotion Level;Stand;Reach Overhead    Examination-Participation Restrictions Laundry;Cleaning;Meal Prep;Community Activity;Interpersonal Relationship    Stability/Clinical Decision Making Stable/Uncomplicated    Clinical Decision Making Low    Rehab Potential Good    PT Frequency 2x / week    PT Duration 8 weeks    PT Treatment/Interventions ADLs/Self Care Home Management;Moist Heat;Gait training;Stair training;Balance training;Therapeutic activities;Patient/family education;Therapeutic exercise;Ultrasound;Functional mobility training;Dry needling;Manual techniques;Passive range of motion;Neuromuscular re-education    PT Next Visit Plan continue work on hip strengthening, stretches, balance work, manual prn    PT Home Exercise Plan [id number]: hip stretching all muscle groups    Consulted and Agree with Plan of Care Patient           Patient will benefit from skilled therapeutic intervention in order to improve the following deficits and impairments:  Abnormal gait, Decreased balance, Decreased endurance, Decreased mobility, Difficulty walking, Obesity, Decreased range of motion, Decreased strength, Increased fascial restricitons, Impaired flexibility, Impaired UE functional use, Postural dysfunction, Pain  Visit Diagnosis: Pain in right hip  Stiffness of right hip, not elsewhere classified     Problem List Patient Active  Problem List   Diagnosis Date Noted  . OSA (obstructive sleep apnea) 10/16/2017  . Cervical stenosis of spinal canal 10/23/2016  . Neck swelling 09/28/2011  . Weight gain 01/02/2011  . Carpal tunnel syndrome 11/02/2010  . Leg swelling 08/26/2010  . ADRENAL MASS, LEFT 05/23/2010  . SPINAL STENOSIS, LUMBAR 12/27/2009  . B12 DEFICIENCY 06/10/2009  . ANEMIA 06/10/2009  . BACK PAIN, LUMBAR 11/06/2008  . KNEE PAIN, LEFT 02/03/2008  . DIABETES MELLITUS, TYPE II, UNCONTROLLED 01/12/2008  . MENOPAUSE-RELATED VASOMOTOR SYMPTOMS, HOT FLASHES 06/23/2007  . BURSITIS, LEFT HIP 06/23/2007  . HYPERLIPIDEMIA 03/25/2007  . ANXIETY 03/25/2007  . DEPRESSION 03/25/2007  . PERIPHERAL NEUROPATHY 03/25/2007  . HYPERTENSION 03/25/2007  . CORONARY ARTERY DISEASE 03/25/2007  . DIVERTICULOSIS, COLON 03/25/2007  . TUBULOVILLOUS ADENOMA, COLON 02/18/2007    Beaulah Dinning, PT, DPT 11/06/19 3:09 PM  [facility] [address] [place], [name], [address] Phone: [phone]   Fax:  [phone]  Name: Oralia Criger MRN: [id number] Date of Birth: 26-Mar-1951

## 2019-11-09 ENCOUNTER — Other Ambulatory Visit: Payer: Self-pay

## 2019-11-09 ENCOUNTER — Encounter: Payer: Self-pay | Admitting: Physical Therapy

## 2019-11-09 ENCOUNTER — Ambulatory Visit: Payer: Medicare PPO | Admitting: Physical Therapy

## 2019-11-09 DIAGNOSIS — M25651 Stiffness of right hip, not elsewhere classified: Secondary | ICD-10-CM

## 2019-11-09 DIAGNOSIS — R2689 Other abnormalities of gait and mobility: Secondary | ICD-10-CM | POA: Diagnosis not present

## 2019-11-09 DIAGNOSIS — M25551 Pain in right hip: Secondary | ICD-10-CM

## 2019-11-09 DIAGNOSIS — M6281 Muscle weakness (generalized): Secondary | ICD-10-CM | POA: Diagnosis not present

## 2019-11-09 DIAGNOSIS — M542 Cervicalgia: Secondary | ICD-10-CM | POA: Diagnosis not present

## 2019-11-09 NOTE — Therapy (Signed)
[place], [name], [address] Phone: [phone]   Fax:  [phone]  Physical Therapy Treatment  Patient Details  Name: [name] Bean MRN: [id number] Date of Birth: 1950/12/17 Referring Provider (PT): Dr. Ashok Pall    Encounter Date: 11/09/2019   PT End of Session - 11/09/19 1505    Visit Number 7    Number of Visits 16    Date for PT Re-Evaluation 11/30/19    Authorization Type UHC MCR    PT Start Time 1500    PT Stop Time 1540   time will not match charges   PT Time Calculation (min) 40 min    Activity Tolerance Patient tolerated treatment well    Behavior During Therapy [facility] for tasks assessed/performed           Past Medical History:  Diagnosis Date  . Anemia   . Anxiety   . Arthritis   . Asthma   . Broken arm    left arm  . Bronchitis   . Bursitis of left hip   . CAD (coronary artery disease)   . Candidiasis, vagina   . Cardiac arrest ([place]) 06/20/2016   at [facility]; ? due to flash pulm edema vs undertreated chronic CHF  . Cerumen impaction   . CHF (congestive heart failure) ([place])    RECENT ADMIT TO [facility]   . Colon, diverticulosis   . Depression   . Diabetes mellitus    15 YRS AGO  . Fatigue   . Gastroenteritis   . Hot flashes, menopausal   . Hyperlipidemia   . Knee pain, left   . Lumbar back pain   . Maxillary sinusitis    history  . Muscle tear    right gluteus  . Nausea   . Nicotine dependence   . OSA (obstructive sleep apnea) 10/16/2017  . Other B-complex deficiencies   . Otitis media, acute    left  . Peripheral neuropathy   . Spinal stenosis of lumbar region   . Tubulovillous adenoma of colon   . Unspecified essential hypertension     Past Surgical History:  Procedure Laterality Date  . CARDIAC CATHETERIZATION  2015, 2009, 2006  . CERVICAL SPINE SURGERY  2003  . CORONARY ANGIOPLASTY WITH STENT PLACEMENT  2002, 2015  . DILATATION &  CURETTAGE/HYSTEROSCOPY WITH MYOSURE N/A 10/03/2019   Procedure: DILATATION & CURETTAGE/HYSTEROSCOPY WITH MYOSURE;  Surgeon: [name], MD;  Location: [place];  Service: Gynecology;  Laterality: N/A;  request 7:30am OR time in [facility] Gynecology requests 30 minutes OR time  . EYE SURGERY     cataracts bilaterally  . POSTERIOR CERVICAL LAMINECTOMY N/A 10/23/2016   Procedure: POSTERIOR CERVICAL LAMINECTOMY MULTI LEVEL CERVICAL TWO- CERVICAL THREE, CERVICAL THREE- CERVICAL FOUR;  Surgeon: Ashok Pall, MD;  Location: [place];  Service: Neurosurgery;  Laterality: N/A;  POSTERIOR    There were no vitals filed for this visit.   Subjective Assessment - 11/09/19 1503    Subjective Today is not a good day.  i get about 3 days of pain relief from hip then it comes back with a vengeance.  The hot shower and stretching did not help.    Currently in Pain? Yes    Pain Score 4     Pain Location Hip    Pain Orientation Right;Left;Other (Comment)                 [place] Adult PT Treatment/Exercise - 11/09/19 0001  Modalities   Modalities Moist Heat      Moist Heat Therapy   Number Minutes Moist Heat 15 Minutes    Moist Heat Location Hip      Manual Therapy   Soft tissue mobilization STM Rt glute medius, max and piriformis    Myofascial Release Rt hip     Passive ROM bilateral hip ER/IR                     PT Short Term Goals - 10/05/19 1734      PT SHORT TERM GOAL #1   Title Pt will complete balance re-assessment and understand results, set goal for this episode.    Time 4    Period Weeks    Status New    Target Date 11/02/19      PT SHORT TERM GOAL #2   Title Pt will be I with simple HEP for hip, core and balance    Time 4    Period Weeks    Status New    Target Date 11/02/19             PT Long Term Goals - 11/02/19 1434      PT LONG TERM GOAL #1   Title Pt will be able to negotiate 8 steps with 1 rail and improved confidence,  less dependence on UEs, reciprocal pattern.    Baseline can do this especially after PT, limited mostly by ataxia, tone in LEs    Time 8    Period Weeks    Status On-going      PT LONG TERM GOAL #2   Title Pt will be able to lie on Rt hip at night without increased pain    Baseline ongoing    Time 8    Period Weeks    Status On-going      PT LONG TERM GOAL #3   Title FOTO score will improve to 45% to demo improved functional mobility    Baseline ongoing    Time 8    Period Weeks    Status On-going      PT LONG TERM GOAL #4   Title Patient will increase hip AROM in sitting to be able to put Rt foot on Lt thigh.    Baseline see flowsheet, partially met    Time 8    Period Weeks    Status On-going      PT LONG TERM GOAL #5   Title Improve Berg Balance test score at least 3-5 points for decreased fall risk    Baseline Berg score: 45    Time 8    Period Weeks                 Plan - 11/09/19 1537    Clinical Impression Statement Patient with increased pain today, 15 min late.  She continues to have R >L hip pain which limits her mobility, gait and ability to transition from bed to stand to sit. Gets about 3 days of relief from PT.  Encouraged her to add more activity to maintain results of manual PT.    PT Treatment/Interventions ADLs/Self Care Home Management;Moist Heat;Gait training;Stair training;Balance training;Therapeutic activities;Patient/family education;Therapeutic exercise;Ultrasound;Functional mobility training;Dry needling;Manual techniques;Passive range of motion;Neuromuscular re-education    PT Next Visit Plan continue work on hip strengthening, stretches, balance work, manual prn    PT Home Exercise Plan [id number]: hip stretching all muscle groups    Consulted and Agree with Plan of Care Patient  Patient will benefit from skilled therapeutic intervention in order to improve the following deficits and impairments:  Abnormal gait, Decreased  balance, Decreased endurance, Decreased mobility, Difficulty walking, Obesity, Decreased range of motion, Decreased strength, Increased fascial restricitons, Impaired flexibility, Impaired UE functional use, Postural dysfunction, Pain  Visit Diagnosis: Pain in right hip  Stiffness of right hip, not elsewhere classified     Problem List Patient Active Problem List   Diagnosis Date Noted  . OSA (obstructive sleep apnea) 10/16/2017  . Cervical stenosis of spinal canal 10/23/2016  . Neck swelling 09/28/2011  . Weight gain 01/02/2011  . Carpal tunnel syndrome 11/02/2010  . Leg swelling 08/26/2010  . ADRENAL MASS, LEFT 05/23/2010  . SPINAL STENOSIS, LUMBAR 12/27/2009  . B12 DEFICIENCY 06/10/2009  . ANEMIA 06/10/2009  . BACK PAIN, LUMBAR 11/06/2008  . KNEE PAIN, LEFT 02/03/2008  . DIABETES MELLITUS, TYPE II, UNCONTROLLED 01/12/2008  . MENOPAUSE-RELATED VASOMOTOR SYMPTOMS, HOT FLASHES 06/23/2007  . BURSITIS, LEFT HIP 06/23/2007  . HYPERLIPIDEMIA 03/25/2007  . ANXIETY 03/25/2007  . DEPRESSION 03/25/2007  . PERIPHERAL NEUROPATHY 03/25/2007  . HYPERTENSION 03/25/2007  . CORONARY ARTERY DISEASE 03/25/2007  . DIVERTICULOSIS, COLON 03/25/2007  . TUBULOVILLOUS ADENOMA, COLON 02/18/2007    Tom Ragsdale 11/09/2019, 3:39 PM  [facility] [address] [place], [name], [address] Phone: [phone]   Fax:  [phone]  Name: Makenzey Nanni MRN: [id number] Date of Birth: 1950/07/04  Raeford Razor, PT 11/09/19 3:40 PM Phone: [phone] Fax: [phone]

## 2019-11-21 ENCOUNTER — Encounter: Payer: Self-pay | Admitting: Physical Therapy

## 2019-11-21 ENCOUNTER — Other Ambulatory Visit: Payer: Self-pay

## 2019-11-21 ENCOUNTER — Ambulatory Visit: Payer: Medicare PPO | Attending: Family Medicine | Admitting: Physical Therapy

## 2019-11-21 DIAGNOSIS — M25551 Pain in right hip: Secondary | ICD-10-CM | POA: Insufficient documentation

## 2019-11-21 DIAGNOSIS — M25651 Stiffness of right hip, not elsewhere classified: Secondary | ICD-10-CM | POA: Diagnosis not present

## 2019-11-21 NOTE — Therapy (Signed)
[place], [name], [address] Phone: [phone]   Fax:  [phone]  Physical Therapy Treatment  Patient Details  Name: [name] Bean MRN: [id number] Date of Birth: 05-31-1950 Referring Provider (PT): Dr. Ashok Pall    Encounter Date: 11/21/2019   PT End of Session - 11/21/19 1155    Visit Number 8    Number of Visits 16    Date for PT Re-Evaluation 11/30/19    Authorization Type UHC MCR    PT Start Time 1102    PT Stop Time 1145    PT Time Calculation (min) 43 min    Activity Tolerance Patient tolerated treatment well    Behavior During Therapy [facility] for tasks assessed/performed           Past Medical History:  Diagnosis Date  . Anemia   . Anxiety   . Arthritis   . Asthma   . Broken arm    left arm  . Bronchitis   . Bursitis of left hip   . CAD (coronary artery disease)   . Candidiasis, vagina   . Cardiac arrest ([place]) 06/20/2016   at [facility]; ? due to flash pulm edema vs undertreated chronic CHF  . Cerumen impaction   . CHF (congestive heart failure) ([place])    RECENT ADMIT TO [facility]   . Colon, diverticulosis   . Depression   . Diabetes mellitus    15 YRS AGO  . Fatigue   . Gastroenteritis   . Hot flashes, menopausal   . Hyperlipidemia   . Knee pain, left   . Lumbar back pain   . Maxillary sinusitis    history  . Muscle tear    right gluteus  . Nausea   . Nicotine dependence   . OSA (obstructive sleep apnea) 10/16/2017  . Other B-complex deficiencies   . Otitis media, acute    left  . Peripheral neuropathy   . Spinal stenosis of lumbar region   . Tubulovillous adenoma of colon   . Unspecified essential hypertension     Past Surgical History:  Procedure Laterality Date  . CARDIAC CATHETERIZATION  2015, 2009, 2006  . CERVICAL SPINE SURGERY  2003  . CORONARY ANGIOPLASTY WITH STENT PLACEMENT  2002, 2015  . DILATATION & CURETTAGE/HYSTEROSCOPY WITH MYOSURE N/A 10/03/2019    Procedure: DILATATION & CURETTAGE/HYSTEROSCOPY WITH MYOSURE;  Surgeon: [name], MD;  Location: [place];  Service: Gynecology;  Laterality: N/A;  request 7:30am OR time in [facility] Gynecology requests 30 minutes OR time  . EYE SURGERY     cataracts bilaterally  . POSTERIOR CERVICAL LAMINECTOMY N/A 10/23/2016   Procedure: POSTERIOR CERVICAL LAMINECTOMY MULTI LEVEL CERVICAL TWO- CERVICAL THREE, CERVICAL THREE- CERVICAL FOUR;  Surgeon: Ashok Pall, MD;  Location: [place];  Service: Neurosurgery;  Laterality: N/A;  POSTERIOR    There were no vitals filed for this visit.   Subjective Assessment - 11/21/19 1151    Subjective Doing better today than last visit. Pain worse in particular with rainy weather.    Pertinent History Jan 2020 CTS, cervical laminectomy/fusion, cardiac, diabetes, ataxia, falls    Limitations Standing;Walking;House hold activities    Currently in Pain? No/denies                             [facility] Adult PT Treatment/Exercise - 11/21/19 0001      Knee/Hip Exercises: Stretches   ITB Stretch Right;3  reps;20 seconds    Piriformis Stretch Right;3 reps;20 seconds      Knee/Hip Exercises: Standing   Hip Abduction AROM;Stengthening;Both;2 sets;10 reps    Abduction Limitations 3 lbs.    Forward Step Up Right;Left;10 reps;Step Height: 4"      Knee/Hip Exercises: Sidelying   Clams right side 2x10      Manual Therapy   Soft tissue mobilization STM Rt glute medius, max and piriformis            Trigger Point Dry Needling - 11/21/19 0001    Consent Given? Yes    Muscles Treated Back/Hip Gluteus minimus;Gluteus medius;Gluteus maximus;Piriformis    Dry Needling Comments needling in left sidelying with 30 gauge 50 mm needles    Electrical Stimulation Performed with Dry Needling Yes    E-stim with Dry Needling Details TENS 2 pps x 10 minutes                PT Education - 11/21/19 1155    Education Details POC     Person(s) Educated Patient    Methods Explanation    Comprehension Verbalized understanding            PT Short Term Goals - 10/05/19 1734      PT SHORT TERM GOAL #1   Title Pt will complete balance re-assessment and understand results, set goal for this episode.    Time 4    Period Weeks    Status New    Target Date 11/02/19      PT SHORT TERM GOAL #2   Title Pt will be I with simple HEP for hip, core and balance    Time 4    Period Weeks    Status New    Target Date 11/02/19             PT Long Term Goals - 11/02/19 1434      PT LONG TERM GOAL #1   Title Pt will be able to negotiate 8 steps with 1 rail and improved confidence, less dependence on UEs, reciprocal pattern.    Baseline can do this especially after PT, limited mostly by ataxia, tone in LEs    Time 8    Period Weeks    Status On-going      PT LONG TERM GOAL #2   Title Pt will be able to lie on Rt hip at night without increased pain    Baseline ongoing    Time 8    Period Weeks    Status On-going      PT LONG TERM GOAL #3   Title FOTO score will improve to 45% to demo improved functional mobility    Baseline ongoing    Time 8    Period Weeks    Status On-going      PT LONG TERM GOAL #4   Title Patient will increase hip AROM in sitting to be able to put Rt foot on Lt thigh.    Baseline see flowsheet, partially met    Time 8    Period Weeks    Status On-going      PT LONG TERM GOAL #5   Title Improve Berg Balance test score at least 3-5 points for decreased fall risk    Baseline Berg score: 45    Time 8    Period Weeks                 Plan - 11/21/19 1156    Clinical Impression  Statement Improved status from last visit for decreased pain so able to resume strengthening exercises today. Continued manual and also performed dry needling again given previous benefit. Therapy goals ongoing for more consistent/lasting relief but showing good progress today.    Personal Factors and  Comorbidities Comorbidity 3+;Past/Current Experience;Time since onset of injury/illness/exacerbation    Comorbidities cervical myelopathy with fusion, ataxia, chronic pain    Examination-Activity Limitations Dressing;Transfers;Sleep;Bend;Lift;Squat;Locomotion Level;Stand;Reach Overhead    Examination-Participation Restrictions Laundry;Cleaning;Meal Prep;Community Activity;Interpersonal Relationship    Stability/Clinical Decision Making Stable/Uncomplicated    Clinical Decision Making Low    Rehab Potential Good    PT Frequency 2x / week    PT Duration 8 weeks    PT Treatment/Interventions ADLs/Self Care Home Management;Moist Heat;Gait training;Stair training;Balance training;Therapeutic activities;Patient/family education;Therapeutic exercise;Ultrasound;Functional mobility training;Dry needling;Manual techniques;Passive range of motion;Neuromuscular re-education    PT Next Visit Plan recertification/progress note next visit, continue work on hip strengthening, stretches, balance work, manual prn (de-emphasize), further dry needling prn    PT Home Exercise Plan [id number]: hip stretching all muscle groups    Consulted and Agree with Plan of Care Patient           Patient will benefit from skilled therapeutic intervention in order to improve the following deficits and impairments:  Abnormal gait, Decreased balance, Decreased endurance, Decreased mobility, Difficulty walking, Obesity, Decreased range of motion, Decreased strength, Increased fascial restricitons, Impaired flexibility, Impaired UE functional use, Postural dysfunction, Pain  Visit Diagnosis: Pain in right hip  Stiffness of right hip, not elsewhere classified     Problem List Patient Active Problem List   Diagnosis Date Noted  . OSA (obstructive sleep apnea) 10/16/2017  . Cervical stenosis of spinal canal 10/23/2016  . Neck swelling 09/28/2011  . Weight gain 01/02/2011  . Carpal tunnel syndrome 11/02/2010  . Leg swelling  08/26/2010  . ADRENAL MASS, LEFT 05/23/2010  . SPINAL STENOSIS, LUMBAR 12/27/2009  . B12 DEFICIENCY 06/10/2009  . ANEMIA 06/10/2009  . BACK PAIN, LUMBAR 11/06/2008  . KNEE PAIN, LEFT 02/03/2008  . DIABETES MELLITUS, TYPE II, UNCONTROLLED 01/12/2008  . MENOPAUSE-RELATED VASOMOTOR SYMPTOMS, HOT FLASHES 06/23/2007  . BURSITIS, LEFT HIP 06/23/2007  . HYPERLIPIDEMIA 03/25/2007  . ANXIETY 03/25/2007  . DEPRESSION 03/25/2007  . PERIPHERAL NEUROPATHY 03/25/2007  . HYPERTENSION 03/25/2007  . CORONARY ARTERY DISEASE 03/25/2007  . DIVERTICULOSIS, COLON 03/25/2007  . TUBULOVILLOUS ADENOMA, COLON 02/18/2007    Beaulah Dinning, PT, DPT 11/21/19 12:01 PM  [facility] [address] [place], [name], [address] Phone: [phone]   Fax:  [phone]  Name: [name] Bean MRN: [id number] Date of Birth: Mar 13, 1951

## 2019-12-05 ENCOUNTER — Emergency Department (HOSPITAL_COMMUNITY): Payer: Medicare PPO

## 2019-12-05 ENCOUNTER — Encounter: Payer: Self-pay | Admitting: Physical Therapy

## 2019-12-05 ENCOUNTER — Encounter (HOSPITAL_COMMUNITY): Payer: Self-pay

## 2019-12-05 ENCOUNTER — Ambulatory Visit: Payer: Medicare PPO | Admitting: Physical Therapy

## 2019-12-05 ENCOUNTER — Inpatient Hospital Stay (HOSPITAL_COMMUNITY)
Admission: EM | Admit: 2019-12-05 | Discharge: 2020-01-12 | DRG: 246 | Disposition: E | Payer: Medicare PPO | Attending: Internal Medicine | Admitting: Internal Medicine

## 2019-12-05 ENCOUNTER — Other Ambulatory Visit: Payer: Self-pay

## 2019-12-05 DIAGNOSIS — N179 Acute kidney failure, unspecified: Secondary | ICD-10-CM | POA: Diagnosis not present

## 2019-12-05 DIAGNOSIS — J690 Pneumonitis due to inhalation of food and vomit: Secondary | ICD-10-CM | POA: Diagnosis not present

## 2019-12-05 DIAGNOSIS — I214 Non-ST elevation (NSTEMI) myocardial infarction: Secondary | ICD-10-CM | POA: Diagnosis not present

## 2019-12-05 DIAGNOSIS — J9602 Acute respiratory failure with hypercapnia: Secondary | ICD-10-CM

## 2019-12-05 DIAGNOSIS — J323 Chronic sphenoidal sinusitis: Secondary | ICD-10-CM | POA: Diagnosis not present

## 2019-12-05 DIAGNOSIS — E785 Hyperlipidemia, unspecified: Secondary | ICD-10-CM | POA: Diagnosis present

## 2019-12-05 DIAGNOSIS — E539 Vitamin B deficiency, unspecified: Secondary | ICD-10-CM | POA: Diagnosis present

## 2019-12-05 DIAGNOSIS — I2510 Atherosclerotic heart disease of native coronary artery without angina pectoris: Secondary | ICD-10-CM | POA: Diagnosis present

## 2019-12-05 DIAGNOSIS — E875 Hyperkalemia: Secondary | ICD-10-CM | POA: Diagnosis not present

## 2019-12-05 DIAGNOSIS — J9621 Acute and chronic respiratory failure with hypoxia: Secondary | ICD-10-CM | POA: Diagnosis not present

## 2019-12-05 DIAGNOSIS — Z9911 Dependence on respirator [ventilator] status: Secondary | ICD-10-CM | POA: Diagnosis not present

## 2019-12-05 DIAGNOSIS — E1165 Type 2 diabetes mellitus with hyperglycemia: Secondary | ICD-10-CM | POA: Diagnosis not present

## 2019-12-05 DIAGNOSIS — I5043 Acute on chronic combined systolic (congestive) and diastolic (congestive) heart failure: Secondary | ICD-10-CM | POA: Diagnosis present

## 2019-12-05 DIAGNOSIS — R1310 Dysphagia, unspecified: Secondary | ICD-10-CM | POA: Diagnosis present

## 2019-12-05 DIAGNOSIS — J9691 Respiratory failure, unspecified with hypoxia: Secondary | ICD-10-CM

## 2019-12-05 DIAGNOSIS — Z978 Presence of other specified devices: Secondary | ICD-10-CM

## 2019-12-05 DIAGNOSIS — Z20822 Contact with and (suspected) exposure to covid-19: Secondary | ICD-10-CM | POA: Diagnosis not present

## 2019-12-05 DIAGNOSIS — J150 Pneumonia due to Klebsiella pneumoniae: Secondary | ICD-10-CM | POA: Diagnosis not present

## 2019-12-05 DIAGNOSIS — Z79899 Other long term (current) drug therapy: Secondary | ICD-10-CM | POA: Diagnosis not present

## 2019-12-05 DIAGNOSIS — R0602 Shortness of breath: Secondary | ICD-10-CM | POA: Diagnosis not present

## 2019-12-05 DIAGNOSIS — J9601 Acute respiratory failure with hypoxia: Secondary | ICD-10-CM

## 2019-12-05 DIAGNOSIS — E11649 Type 2 diabetes mellitus with hypoglycemia without coma: Secondary | ICD-10-CM | POA: Diagnosis not present

## 2019-12-05 DIAGNOSIS — R579 Shock, unspecified: Secondary | ICD-10-CM

## 2019-12-05 DIAGNOSIS — I469 Cardiac arrest, cause unspecified: Secondary | ICD-10-CM | POA: Diagnosis present

## 2019-12-05 DIAGNOSIS — R0902 Hypoxemia: Secondary | ICD-10-CM | POA: Diagnosis not present

## 2019-12-05 DIAGNOSIS — J9600 Acute respiratory failure, unspecified whether with hypoxia or hypercapnia: Secondary | ICD-10-CM

## 2019-12-05 DIAGNOSIS — E1129 Type 2 diabetes mellitus with other diabetic kidney complication: Secondary | ICD-10-CM | POA: Diagnosis not present

## 2019-12-05 DIAGNOSIS — F419 Anxiety disorder, unspecified: Secondary | ICD-10-CM | POA: Diagnosis present

## 2019-12-05 DIAGNOSIS — Z515 Encounter for palliative care: Secondary | ICD-10-CM | POA: Diagnosis not present

## 2019-12-05 DIAGNOSIS — I468 Cardiac arrest due to other underlying condition: Secondary | ICD-10-CM | POA: Diagnosis present

## 2019-12-05 DIAGNOSIS — Z8674 Personal history of sudden cardiac arrest: Secondary | ICD-10-CM | POA: Diagnosis not present

## 2019-12-05 DIAGNOSIS — G4733 Obstructive sleep apnea (adult) (pediatric): Secondary | ICD-10-CM | POA: Diagnosis present

## 2019-12-05 DIAGNOSIS — I700 Atherosclerosis of aorta: Secondary | ICD-10-CM | POA: Diagnosis not present

## 2019-12-05 DIAGNOSIS — Z794 Long term (current) use of insulin: Secondary | ICD-10-CM

## 2019-12-05 DIAGNOSIS — I472 Ventricular tachycardia: Secondary | ICD-10-CM | POA: Diagnosis not present

## 2019-12-05 DIAGNOSIS — G9349 Other encephalopathy: Secondary | ICD-10-CM | POA: Diagnosis present

## 2019-12-05 DIAGNOSIS — R4182 Altered mental status, unspecified: Secondary | ICD-10-CM | POA: Diagnosis not present

## 2019-12-05 DIAGNOSIS — R570 Cardiogenic shock: Secondary | ICD-10-CM | POA: Diagnosis present

## 2019-12-05 DIAGNOSIS — E872 Acidosis: Secondary | ICD-10-CM | POA: Diagnosis not present

## 2019-12-05 DIAGNOSIS — M25551 Pain in right hip: Secondary | ICD-10-CM

## 2019-12-05 DIAGNOSIS — Z4682 Encounter for fitting and adjustment of non-vascular catheter: Secondary | ICD-10-CM | POA: Diagnosis not present

## 2019-12-05 DIAGNOSIS — J9811 Atelectasis: Secondary | ICD-10-CM | POA: Diagnosis not present

## 2019-12-05 DIAGNOSIS — R092 Respiratory arrest: Secondary | ICD-10-CM | POA: Diagnosis not present

## 2019-12-05 DIAGNOSIS — I161 Hypertensive emergency: Secondary | ICD-10-CM | POA: Diagnosis present

## 2019-12-05 DIAGNOSIS — E1142 Type 2 diabetes mellitus with diabetic polyneuropathy: Secondary | ICD-10-CM | POA: Diagnosis present

## 2019-12-05 DIAGNOSIS — Z955 Presence of coronary angioplasty implant and graft: Secondary | ICD-10-CM | POA: Diagnosis not present

## 2019-12-05 DIAGNOSIS — I517 Cardiomegaly: Secondary | ICD-10-CM | POA: Diagnosis not present

## 2019-12-05 DIAGNOSIS — I447 Left bundle-branch block, unspecified: Secondary | ICD-10-CM | POA: Diagnosis present

## 2019-12-05 DIAGNOSIS — R0689 Other abnormalities of breathing: Secondary | ICD-10-CM | POA: Diagnosis not present

## 2019-12-05 DIAGNOSIS — Z789 Other specified health status: Secondary | ICD-10-CM

## 2019-12-05 DIAGNOSIS — J90 Pleural effusion, not elsewhere classified: Secondary | ICD-10-CM | POA: Diagnosis not present

## 2019-12-05 DIAGNOSIS — I5031 Acute diastolic (congestive) heart failure: Secondary | ICD-10-CM | POA: Diagnosis not present

## 2019-12-05 DIAGNOSIS — E162 Hypoglycemia, unspecified: Secondary | ICD-10-CM

## 2019-12-05 DIAGNOSIS — Z9289 Personal history of other medical treatment: Secondary | ICD-10-CM

## 2019-12-05 DIAGNOSIS — R404 Transient alteration of awareness: Secondary | ICD-10-CM | POA: Diagnosis not present

## 2019-12-05 DIAGNOSIS — J80 Acute respiratory distress syndrome: Secondary | ICD-10-CM | POA: Diagnosis not present

## 2019-12-05 DIAGNOSIS — I110 Hypertensive heart disease with heart failure: Principal | ICD-10-CM | POA: Diagnosis present

## 2019-12-05 DIAGNOSIS — Z452 Encounter for adjustment and management of vascular access device: Secondary | ICD-10-CM | POA: Diagnosis not present

## 2019-12-05 DIAGNOSIS — I10 Essential (primary) hypertension: Secondary | ICD-10-CM | POA: Diagnosis not present

## 2019-12-05 DIAGNOSIS — J3489 Other specified disorders of nose and nasal sinuses: Secondary | ICD-10-CM | POA: Diagnosis not present

## 2019-12-05 DIAGNOSIS — F17210 Nicotine dependence, cigarettes, uncomplicated: Secondary | ICD-10-CM | POA: Diagnosis present

## 2019-12-05 DIAGNOSIS — J811 Chronic pulmonary edema: Secondary | ICD-10-CM | POA: Diagnosis not present

## 2019-12-05 DIAGNOSIS — J984 Other disorders of lung: Secondary | ICD-10-CM | POA: Diagnosis not present

## 2019-12-05 DIAGNOSIS — Z7982 Long term (current) use of aspirin: Secondary | ICD-10-CM

## 2019-12-05 DIAGNOSIS — R918 Other nonspecific abnormal finding of lung field: Secondary | ICD-10-CM | POA: Diagnosis not present

## 2019-12-05 DIAGNOSIS — I959 Hypotension, unspecified: Secondary | ICD-10-CM | POA: Diagnosis not present

## 2019-12-05 DIAGNOSIS — E8770 Fluid overload, unspecified: Secondary | ICD-10-CM | POA: Diagnosis not present

## 2019-12-05 DIAGNOSIS — Z4659 Encounter for fitting and adjustment of other gastrointestinal appliance and device: Secondary | ICD-10-CM

## 2019-12-05 DIAGNOSIS — J9690 Respiratory failure, unspecified, unspecified whether with hypoxia or hypercapnia: Secondary | ICD-10-CM | POA: Diagnosis not present

## 2019-12-05 DIAGNOSIS — J440 Chronic obstructive pulmonary disease with acute lower respiratory infection: Secondary | ICD-10-CM | POA: Diagnosis present

## 2019-12-05 DIAGNOSIS — I5033 Acute on chronic diastolic (congestive) heart failure: Secondary | ICD-10-CM | POA: Diagnosis not present

## 2019-12-05 DIAGNOSIS — F410 Panic disorder [episodic paroxysmal anxiety] without agoraphobia: Secondary | ICD-10-CM | POA: Diagnosis present

## 2019-12-05 DIAGNOSIS — Z01818 Encounter for other preprocedural examination: Secondary | ICD-10-CM

## 2019-12-05 DIAGNOSIS — M25651 Stiffness of right hip, not elsewhere classified: Secondary | ICD-10-CM

## 2019-12-05 DIAGNOSIS — J810 Acute pulmonary edema: Secondary | ICD-10-CM | POA: Diagnosis not present

## 2019-12-05 DIAGNOSIS — I462 Cardiac arrest due to underlying cardiac condition: Secondary | ICD-10-CM | POA: Diagnosis not present

## 2019-12-05 LAB — CBC WITH DIFFERENTIAL/PLATELET
Abs Immature Granulocytes: 0.16 10*3/uL — ABNORMAL HIGH (ref 0.00–0.07)
Basophils Absolute: 0.1 10*3/uL (ref 0.0–0.1)
Basophils Relative: 1 %
Eosinophils Absolute: 0.2 10*3/uL (ref 0.0–0.5)
Eosinophils Relative: 3 %
HCT: 46.4 % — ABNORMAL HIGH (ref 36.0–46.0)
Hemoglobin: 14.9 g/dL (ref 12.0–15.0)
Immature Granulocytes: 2 %
Lymphocytes Relative: 40 %
Lymphs Abs: 3.5 10*3/uL (ref 0.7–4.0)
MCH: 30.1 pg (ref 26.0–34.0)
MCHC: 32.1 g/dL (ref 30.0–36.0)
MCV: 93.7 fL (ref 80.0–100.0)
Monocytes Absolute: 0.3 10*3/uL (ref 0.1–1.0)
Monocytes Relative: 4 %
Neutro Abs: 4.4 10*3/uL (ref 1.7–7.7)
Neutrophils Relative %: 50 %
Platelets: 272 10*3/uL (ref 150–400)
RBC: 4.95 MIL/uL (ref 3.87–5.11)
RDW: 12.2 % (ref 11.5–15.5)
WBC: 8.7 10*3/uL (ref 4.0–10.5)
nRBC: 0.2 % (ref 0.0–0.2)

## 2019-12-05 LAB — I-STAT ARTERIAL BLOOD GAS, ED
Acid-base deficit: 8 mmol/L — ABNORMAL HIGH (ref 0.0–2.0)
Bicarbonate: 20.6 mmol/L (ref 20.0–28.0)
Calcium, Ion: 1.14 mmol/L — ABNORMAL LOW (ref 1.15–1.40)
HCT: 42 % (ref 36.0–46.0)
Hemoglobin: 14.3 g/dL (ref 12.0–15.0)
O2 Saturation: 74 %
Potassium: 3.3 mmol/L — ABNORMAL LOW (ref 3.5–5.1)
Sodium: 139 mmol/L (ref 135–145)
TCO2: 22 mmol/L (ref 22–32)
pCO2 arterial: 52.3 mmHg — ABNORMAL HIGH (ref 32.0–48.0)
pH, Arterial: 7.203 — ABNORMAL LOW (ref 7.350–7.450)
pO2, Arterial: 48 mmHg — ABNORMAL LOW (ref 83.0–108.0)

## 2019-12-05 LAB — URINALYSIS, ROUTINE W REFLEX MICROSCOPIC
Bilirubin Urine: NEGATIVE
Glucose, UA: >=500 mg/dL — AB
Hgb urine dipstick: NEGATIVE
Ketones, ur: NEGATIVE mg/dL
Leukocytes,Ua: NEGATIVE
Nitrite: NEGATIVE
Protein, ur: 100 mg/dL — AB
Specific Gravity, Urine: 1.018 (ref 1.005–1.030)
pH: 5 (ref 5.0–8.0)

## 2019-12-05 LAB — COMPREHENSIVE METABOLIC PANEL
ALT: 21 U/L (ref 0–44)
AST: 38 U/L (ref 15–41)
Albumin: 2.8 g/dL — ABNORMAL LOW (ref 3.5–5.0)
Alkaline Phosphatase: 106 U/L (ref 38–126)
Anion gap: 10 (ref 5–15)
BUN: 11 mg/dL (ref 8–23)
CO2: 19 mmol/L — ABNORMAL LOW (ref 22–32)
Calcium: 7.5 mg/dL — ABNORMAL LOW (ref 8.9–10.3)
Chloride: 107 mmol/L (ref 98–111)
Creatinine, Ser: 0.87 mg/dL (ref 0.44–1.00)
GFR calc Af Amer: >60 mL/min (ref >60)
GFR calc non Af Amer: >60 mL/min (ref >60)
Glucose, Bld: 441 mg/dL — ABNORMAL HIGH (ref 70–99)
Potassium: 3.8 mmol/L (ref 3.5–5.1)
Sodium: 136 mmol/L (ref 135–145)
Total Bilirubin: 0.5 mg/dL (ref 0.3–1.2)
Total Protein: 5.9 g/dL — ABNORMAL LOW (ref 6.5–8.1)

## 2019-12-05 LAB — POCT I-STAT 7, (LYTES, BLD GAS, ICA,H+H)
Acid-base deficit: 8 mmol/L — ABNORMAL HIGH (ref 0.0–2.0)
Acid-base deficit: 7 mmol/L — ABNORMAL HIGH (ref 0.0–2.0)
Bicarbonate: 19.7 mmol/L — ABNORMAL LOW (ref 20.0–28.0)
Bicarbonate: 20 mmol/L (ref 20.0–28.0)
Calcium, Ion: 1.1 mmol/L — ABNORMAL LOW (ref 1.15–1.40)
Calcium, Ion: 1.11 mmol/L — ABNORMAL LOW (ref 1.15–1.40)
HCT: 45 % (ref 36.0–46.0)
HCT: 41 % (ref 36.0–46.0)
Hemoglobin: 15.3 g/dL — ABNORMAL HIGH (ref 12.0–15.0)
Hemoglobin: 13.9 g/dL (ref 12.0–15.0)
O2 Saturation: 100 %
O2 Saturation: 94 %
Patient temperature: 97.8
Patient temperature: 99.5
Potassium: 5 mmol/L (ref 3.5–5.1)
Potassium: 3.8 mmol/L (ref 3.5–5.1)
Sodium: 138 mmol/L (ref 135–145)
Sodium: 142 mmol/L (ref 135–145)
TCO2: 21 mmol/L — ABNORMAL LOW (ref 22–32)
TCO2: 21 mmol/L — ABNORMAL LOW (ref 22–32)
pCO2 arterial: 44.6 mmHg (ref 32.0–48.0)
pCO2 arterial: 46.4 mmHg (ref 32.0–48.0)
pH, Arterial: 7.251 — ABNORMAL LOW (ref 7.350–7.450)
pH, Arterial: 7.244 — ABNORMAL LOW (ref 7.350–7.450)
pO2, Arterial: 275 mmHg — ABNORMAL HIGH (ref 83.0–108.0)
pO2, Arterial: 83 mmHg (ref 83.0–108.0)

## 2019-12-05 LAB — PROTIME-INR
INR: 1.1 (ref 0.8–1.2)
Prothrombin Time: 14.1 seconds (ref 11.4–15.2)

## 2019-12-05 LAB — TROPONIN I (HIGH SENSITIVITY)
Troponin I (High Sensitivity): 293 ng/L (ref <18)
Troponin I (High Sensitivity): 8932 ng/L (ref <18)

## 2019-12-05 LAB — I-STAT CHEM 8, ED
BUN: 12 mg/dL (ref 8–23)
Calcium, Ion: 1 mmol/L — ABNORMAL LOW (ref 1.15–1.40)
Chloride: 103 mmol/L (ref 98–111)
Creatinine, Ser: 0.8 mg/dL (ref 0.44–1.00)
Glucose, Bld: 513 mg/dL (ref 70–99)
HCT: 46 % (ref 36.0–46.0)
Hemoglobin: 15.6 g/dL — ABNORMAL HIGH (ref 12.0–15.0)
Potassium: 4.6 mmol/L (ref 3.5–5.1)
Sodium: 135 mmol/L (ref 135–145)
TCO2: 18 mmol/L — ABNORMAL LOW (ref 22–32)

## 2019-12-05 LAB — GLUCOSE, CAPILLARY
Glucose-Capillary: 393 mg/dL — ABNORMAL HIGH (ref 70–99)
Glucose-Capillary: 316 mg/dL — ABNORMAL HIGH (ref 70–99)
Glucose-Capillary: 383 mg/dL — ABNORMAL HIGH (ref 70–99)

## 2019-12-05 LAB — ECHOCARDIOGRAM COMPLETE
Height: 62 in
Weight: 3354.52 oz

## 2019-12-05 LAB — PROCALCITONIN: Procalcitonin: <0.1 ng/mL

## 2019-12-05 LAB — BRAIN NATRIURETIC PEPTIDE: B Natriuretic Peptide: 162.7 pg/mL — ABNORMAL HIGH (ref 0.0–100.0)

## 2019-12-05 LAB — SARS CORONAVIRUS 2 BY RT PCR (HOSPITAL ORDER, PERFORMED IN ~~LOC~~ HOSPITAL LAB)
SARS Coronavirus 2: NEGATIVE
SARS Coronavirus 2: NEGATIVE

## 2019-12-05 LAB — LACTIC ACID, PLASMA: Lactic Acid, Venous: 7 mmol/L (ref 0.5–1.9)

## 2019-12-05 MED ORDER — INSULIN REGULAR(HUMAN) IN NACL 100-0.9 UT/100ML-% IV SOLN
INTRAVENOUS | Status: DC
  Administered 2019-12-05: 15 [IU]/h via INTRAVENOUS
  Administered 2019-12-06: 12 [IU]/h via INTRAVENOUS
  Filled 2019-12-05 (×2): qty 100

## 2019-12-05 MED ORDER — POLYETHYLENE GLYCOL 3350 17 G PO PACK: 17.0000 g | PACK | Freq: Every day | ORAL | Status: DC

## 2019-12-05 MED ORDER — FENTANYL CITRATE (PF) 100 MCG/2ML IJ SOLN
INTRAMUSCULAR | Status: DC | PRN
  Administered 2019-12-05: 100 ug via INTRAVENOUS

## 2019-12-05 MED ORDER — ETOMIDATE 2 MG/ML IV SOLN
INTRAVENOUS | Status: AC | PRN
  Administered 2019-12-05: 20 mg via INTRAVENOUS

## 2019-12-05 MED ORDER — INSULIN DETEMIR 100 UNIT/ML ~~LOC~~ SOLN
15.0000 [IU] | Freq: Two times a day (BID) | SUBCUTANEOUS | Status: DC
  Filled 2019-12-05: qty 0.15

## 2019-12-05 MED ORDER — DOCUSATE SODIUM 50 MG/5ML PO LIQD
100.0000 mg | Freq: Two times a day (BID) | ORAL | Status: DC
  Filled 2019-12-05 (×2): qty 10

## 2019-12-05 MED ORDER — POLYETHYLENE GLYCOL 3350 17 G PO PACK: 17.0000 g | PACK | Freq: Every day | ORAL | Status: DC | PRN

## 2019-12-05 MED ORDER — FENTANYL CITRATE (PF) 100 MCG/2ML IJ SOLN: 25.0000 ug | INTRAMUSCULAR | Status: DC | PRN

## 2019-12-05 MED ORDER — SODIUM CHLORIDE 0.9 % IV SOLN
INTRAVENOUS | Status: AC | PRN
  Administered 2019-12-05: 100 mL/h via INTRAVENOUS
  Administered 2019-12-05: 1000 mL via INTRAVENOUS
  Administered 2019-12-11: 500 mL via INTRAVENOUS

## 2019-12-05 MED ORDER — VECURONIUM BROMIDE 10 MG IV SOLR: 9.5000 mg | INTRAVENOUS | Status: DC | PRN

## 2019-12-05 MED ORDER — FENTANYL BOLUS VIA INFUSION
25.0000 ug | INTRAVENOUS | Status: DC | PRN
  Filled 2019-12-05: qty 25

## 2019-12-05 MED ORDER — ONDANSETRON HCL 4 MG/2ML IJ SOLN
4.0000 mg | Freq: Four times a day (QID) | INTRAMUSCULAR | Status: DC | PRN
  Administered 2019-12-07: 4 mg via INTRAVENOUS
  Filled 2019-12-05: qty 2

## 2019-12-05 MED ORDER — ROCURONIUM BROMIDE 50 MG/5ML IV SOLN
INTRAVENOUS | Status: AC | PRN
  Administered 2019-12-05: 100 mg via INTRAVENOUS

## 2019-12-05 MED ORDER — NOREPINEPHRINE 16 MG/250ML-% IV SOLN
0.0000 ug/min | INTRAVENOUS | Status: DC
  Administered 2019-12-06: 10 ug/min via INTRAVENOUS
  Filled 2019-12-05: qty 250

## 2019-12-05 MED ORDER — PERFLUTREN LIPID MICROSPHERE
1.0000 mL | INTRAVENOUS | Status: AC | PRN
  Administered 2019-12-05: 2 mL via INTRAVENOUS
  Filled 2019-12-05: qty 10

## 2019-12-05 MED ORDER — NOREPINEPHRINE 4 MG/250ML-% IV SOLN
INTRAVENOUS | Status: AC
  Filled 2019-12-05: qty 250

## 2019-12-05 MED ORDER — ARTIFICIAL TEARS OPHTHALMIC OINT: 1.0000 "application " | TOPICAL_OINTMENT | Freq: Three times a day (TID) | OPHTHALMIC | Status: DC

## 2019-12-05 MED ORDER — DEXTROSE 50 % IV SOLN: 0.0000 mL | INTRAVENOUS | Status: DC | PRN

## 2019-12-05 MED ORDER — NOREPINEPHRINE 4 MG/250ML-% IV SOLN
0.0000 ug/min | INTRAVENOUS | Status: DC
  Administered 2019-12-05: 15 ug/min via INTRAVENOUS

## 2019-12-05 MED ORDER — FENTANYL CITRATE (PF) 100 MCG/2ML IJ SOLN: 50.0000 ug | Freq: Once | INTRAMUSCULAR | Status: DC

## 2019-12-05 MED ORDER — DOCUSATE SODIUM 100 MG PO CAPS
100.0000 mg | ORAL_CAPSULE | Freq: Two times a day (BID) | ORAL | Status: DC | PRN
  Filled 2019-12-05: qty 1

## 2019-12-05 MED ORDER — SODIUM CHLORIDE 0.9 % IV SOLN
3.0000 g | Freq: Four times a day (QID) | INTRAVENOUS | Status: AC
  Administered 2019-12-05 – 2019-12-10 (×19): 3 g via INTRAVENOUS
  Filled 2019-12-05 (×2): qty 3
  Filled 2019-12-05: qty 8
  Filled 2019-12-05 (×9): qty 3
  Filled 2019-12-05 (×2): qty 8
  Filled 2019-12-05 (×3): qty 3
  Filled 2019-12-05: qty 8
  Filled 2019-12-05 (×2): qty 3

## 2019-12-05 MED ORDER — FENTANYL CITRATE (PF) 100 MCG/2ML IJ SOLN: 25.0000 ug | Freq: Once | INTRAMUSCULAR | Status: DC

## 2019-12-05 MED ORDER — FENTANYL 2500MCG IN NS 250ML (10MCG/ML) PREMIX INFUSION
25.0000 ug/h | INTRAVENOUS | Status: DC
  Filled 2019-12-05: qty 250

## 2019-12-05 MED ORDER — CHLORHEXIDINE GLUCONATE 0.12% ORAL RINSE (MEDLINE KIT)
15.0000 mL | Freq: Two times a day (BID) | OROMUCOSAL | Status: DC
  Administered 2019-12-05 – 2019-12-10 (×10): 15 mL via OROMUCOSAL

## 2019-12-05 MED ORDER — ORAL CARE MOUTH RINSE
15.0000 mL | OROMUCOSAL | Status: DC
  Administered 2019-12-05 – 2019-12-10 (×47): 15 mL via OROMUCOSAL

## 2019-12-05 MED ORDER — HEPARIN SODIUM (PORCINE) 5000 UNIT/ML IJ SOLN
5000.0000 [IU] | Freq: Three times a day (TID) | INTRAMUSCULAR | Status: DC
  Administered 2019-12-05 – 2019-12-06 (×2): 5000 [IU] via SUBCUTANEOUS
  Filled 2019-12-05 (×3): qty 1

## 2019-12-05 MED ORDER — ACETAMINOPHEN 325 MG PO TABS
650.0000 mg | ORAL_TABLET | ORAL | Status: DC | PRN
  Administered 2019-12-06: 650 mg via ORAL
  Filled 2019-12-05: qty 2

## 2019-12-05 MED ORDER — PROPOFOL 1000 MG/100ML IV EMUL: 0.0000 ug/kg/min | INTRAVENOUS | Status: DC

## 2019-12-05 MED ORDER — PROPOFOL 1000 MG/100ML IV EMUL
25.0000 ug/kg/min | INTRAVENOUS | Status: DC
  Administered 2019-12-05: 10 ug/kg/min via INTRAVENOUS
  Administered 2019-12-05 – 2019-12-06 (×2): 30 ug/kg/min via INTRAVENOUS
  Filled 2019-12-05: qty 200

## 2019-12-05 MED ORDER — PANTOPRAZOLE SODIUM 40 MG IV SOLR
40.0000 mg | Freq: Every day | INTRAVENOUS | Status: DC
  Administered 2019-12-05 – 2019-12-06 (×2): 40 mg via INTRAVENOUS
  Filled 2019-12-05 (×2): qty 40

## 2019-12-05 MED ORDER — PHENYLEPHRINE HCL-NACL 10-0.9 MG/250ML-% IV SOLN
0.0000 ug/min | INTRAVENOUS | Status: DC
  Administered 2019-12-05: 20 ug/min via INTRAVENOUS
  Filled 2019-12-05: qty 250

## 2019-12-05 MED ORDER — NOREPINEPHRINE 4 MG/250ML-% IV SOLN
INTRAVENOUS | Status: DC | PRN
  Administered 2019-12-05: 15 ug/min via INTRAVENOUS
  Administered 2019-12-05: 100 ug/min via INTRAVENOUS

## 2019-12-05 MED ORDER — ROCURONIUM BROMIDE 50 MG/5ML IV SOLN
INTRAVENOUS | Status: DC | PRN
  Administered 2019-12-05: 100 mg via INTRAVENOUS

## 2019-12-05 MED ORDER — FENTANYL 2500MCG IN NS 250ML (10MCG/ML) PREMIX INFUSION: 25.0000 ug/h | INTRAVENOUS | Status: DC

## 2019-12-05 MED ORDER — SODIUM CHLORIDE 0.9 % IV SOLN
500.0000 mg | INTRAVENOUS | Status: DC
  Administered 2019-12-05: 500 mg via INTRAVENOUS
  Filled 2019-12-05 (×2): qty 500

## 2019-12-05 MED ORDER — FUROSEMIDE 10 MG/ML IJ SOLN
40.0000 mg | Freq: Once | INTRAMUSCULAR | Status: AC
  Administered 2019-12-05: 40 mg via INTRAVENOUS
  Filled 2019-12-05: qty 4

## 2019-12-05 MED ORDER — FENTANYL 2500MCG IN NS 250ML (10MCG/ML) PREMIX INFUSION
25.0000 ug/h | INTRAVENOUS | Status: DC
  Administered 2019-12-05: 50 ug/h via INTRAVENOUS
  Filled 2019-12-05: qty 250

## 2019-12-05 MED ORDER — INSULIN ASPART 100 UNIT/ML ~~LOC~~ SOLN: 2.0000 [IU] | SUBCUTANEOUS | Status: DC

## 2019-12-05 MED ORDER — FENTANYL CITRATE (PF) 100 MCG/2ML IJ SOLN
INTRAMUSCULAR | Status: AC
  Filled 2019-12-05: qty 2

## 2019-12-05 MED ORDER — SODIUM CHLORIDE 0.9 % IV BOLUS: 1000.0000 mL | Freq: Once | INTRAVENOUS | Status: DC

## 2019-12-05 NOTE — Procedures (Signed)
Central Venous Catheter Insertion Procedure Note  [name] Bean  [id number]  22-Aug-1950  Date:11/16/2019  Time:4:14 PM   Provider Performing:Bo Teicher E Parminder Trapani   Procedure: Insertion of Non-tunneled Central Venous Catheter(36556) with US guidance (74081)   Indication(s) Medication administration  Consent Unable to obtain consent due to emergent nature of procedure.  Anesthesia Topical only with 1% lidocaine   Timeout Verified patient identification, verified procedure, site/side was marked, verified correct patient position, special equipment/implants available, medications/allergies/relevant history reviewed, required imaging and test results available.  Sterile Technique Maximal sterile technique including full sterile barrier drape, hand hygiene, sterile gown, sterile gloves, mask, hair covering, sterile ultrasound probe cover (if used).  Procedure Description Area of catheter insertion was cleaned with chlorhexidine and draped in sterile fashion.  With real-time ultrasound guidance a central venous catheter was placed into the left internal jugular vein. Nonpulsatile blood flow and easy flushing noted in all ports.  The catheter was sutured in place and sterile dressing applied.  Complications/Tolerance None; patient tolerated the procedure well. Chest X-ray is ordered to verify placement for internal jugular or subclavian cannulation.   Chest x-ray is not ordered for femoral cannulation.  EBL Minimal  Specimen(s) None   [name] Gum MSN, AGACNP-BC [place] [phone] If no answer, [phone] 12/04/2019, 4:15 PM

## 2019-12-05 NOTE — ED Provider Notes (Signed)
[facility] EMERGENCY DEPARTMENT Provider Note   CSN: [id number] Arrival date & time: 12/01/2019  1358     History Chief Complaint  Patient presents with   Post Arrest    [name] Bean is a 69 y.o. female.  The history is provided by the EMS personnel and medical records. No language interpreter was used.   [name] Bean is a 69 y.o. female who presents to the Emergency Department complaining of cardiac arrest. Level V caveat due to unresponsiveness. History is provided by EMS. Per EMS patient called out from home for difficulty breathing and became unresponsive when on the phone with the dispatcher. On fire arrival she was found to be in cardiac arrest with PEA. She was treated with three rounds of CPR and on EMS arrival she was found to have ROSC. Bag valve mask ventilation was continued on arrival to the emergency department. They did have to treat her with 5 mg of IM Versed due to agitation. They state that patient was agitated and pulling out the mask and moving all four extremities prior to ED arrival.     Past Medical History:  Diagnosis Date   Anemia    Anxiety    Arthritis    Asthma    Broken arm    left arm   Bronchitis    Bursitis of left hip    CAD (coronary artery disease)    Candidiasis, vagina    Cardiac arrest ([place]) 06/20/2016   at [facility]; ? due to flash pulm edema vs undertreated chronic CHF   Cerumen impaction    CHF (congestive heart failure) (HCC)    RECENT ADMIT TO [facility]    Colon, diverticulosis    Depression    Diabetes mellitus    15 YRS AGO   Fatigue    Gastroenteritis    Hot flashes, menopausal    Hyperlipidemia    Knee pain, left    Lumbar back pain    Maxillary sinusitis    history   Muscle tear    right gluteus   Nausea    Nicotine dependence    OSA (obstructive sleep apnea) 10/16/2017   Other B-complex deficiencies    Otitis media, acute    left   Peripheral  neuropathy    Spinal stenosis of lumbar region    Tubulovillous adenoma of colon    Unspecified essential hypertension     Patient Active Problem List   Diagnosis Date Noted   OSA (obstructive sleep apnea) 10/16/2017   Cervical stenosis of spinal canal 10/23/2016   Neck swelling 09/28/2011   Weight gain 01/02/2011   Carpal tunnel syndrome 11/02/2010   Leg swelling 08/26/2010   ADRENAL MASS, LEFT 05/23/2010   SPINAL STENOSIS, LUMBAR 12/27/2009   B12 DEFICIENCY 06/10/2009   ANEMIA 06/10/2009   BACK PAIN, LUMBAR 11/06/2008   KNEE PAIN, LEFT 02/03/2008   DIABETES MELLITUS, TYPE II, UNCONTROLLED 01/12/2008   MENOPAUSE-RELATED VASOMOTOR SYMPTOMS, HOT FLASHES 06/23/2007   BURSITIS, LEFT HIP 06/23/2007   HYPERLIPIDEMIA 03/25/2007   ANXIETY 03/25/2007   DEPRESSION 03/25/2007   PERIPHERAL NEUROPATHY 03/25/2007   HYPERTENSION 03/25/2007   CORONARY ARTERY DISEASE 03/25/2007   DIVERTICULOSIS, COLON 03/25/2007   TUBULOVILLOUS ADENOMA, 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/18/2007    Past Surgical History:  Procedure Laterality Date   CARDIAC CATHETERIZATION  2015, 2009, 2006   CERVICAL SPINE SURGERY  2003   CORONARY ANGIOPLASTY WITH STENT PLACEMENT  2002, 2015   DILATATION & CURETTAGE/HYSTEROSCOPY WITH MYOSURE N/A 10/03/2019  Procedure: DILATATION & CURETTAGE/HYSTEROSCOPY WITH MYOSURE;  Surgeon: Theresia Majors, MD;  Location: [facility];  Service: Gynecology;  Laterality: N/A;  request 7:30am OR time in [facility] Gynecology requests 30 minutes OR time   EYE SURGERY     cataracts bilaterally   POSTERIOR CERVICAL LAMINECTOMY N/A 10/23/2016   Procedure: POSTERIOR CERVICAL LAMINECTOMY MULTI LEVEL CERVICAL TWO- CERVICAL THREE, CERVICAL THREE- CERVICAL FOUR;  Surgeon: Coletta Memos, MD;  Location: [facility] OR;  Service: Neurosurgery;  Laterality: N/A;  POSTERIOR     OB History    Gravida  1   Para      Term      Preterm      AB  1   Living        SAB      TAB   1   Ectopic      Multiple      Live Births              Family History  Problem Relation Age of Onset   Pneumonia Father        deceased   Alcohol abuse Father    Hypertension Mother    Diabetes Mother     Social History   Tobacco Use   Smoking status: Current Every Day Smoker    Packs/day: 0.25    Years: 46.00    Pack years: 11.50    Types: Cigarettes   Smokeless tobacco: Never Used   Tobacco comment: smokes two a day  Vaping Use   Vaping Use: Never used  Substance Use Topics   Alcohol use: Yes    Comment: rarely   Drug use: Yes    Types: Marijuana    Home Medications Prior to Admission medications   Medication Sig Start Date End Date Taking? Authorizing Provider  acetaminophen (TYLENOL) 500 MG tablet Take 2 tablets (1,000 mg total) by mouth every 6 (six) hours as needed for mild pain. 10/03/19   Theresia Majors, MD  aspirin EC 81 MG tablet Take 81 mg by mouth daily.     [provider]  BD PEN NEEDLE NANO U/F 32G X 4 MM MISC USE TO INJECT INSULIN 5 TIMES A DAY 03/15/13   Yoo, Doe-Hyun R, DO  bimatoprost (LUMIGAN) 0.01 % SOLN Lumigan 0.01 % eye drops  PUT 1 DROP INTO BOTH EYES AT BEDTIME    [provider]  Blood Glucose Monitoring Suppl (ACCU-CHEK COMPACT CARE KIT) KIT Accu-Chek Compact Plus Care kit  USE AS INSTRUCTED.    [provider]  carvedilol (COREG) 25 MG tablet Take 1 tablet (25 mg total) by mouth in the morning and at bedtime. 07/31/19   Bensimhon, Bevelyn Buckles, MD  glucose blood (FREESTYLE TEST STRIPS) test strip 1 each by Other route daily. (accu-chek guide) DX E11.9 08/06/17   Deeann Saint, MD  hydrALAZINE (APRESOLINE) 50 MG tablet Take 1 tablet (50 mg total) by mouth in the morning and at bedtime. 07/31/19   Bensimhon, Bevelyn Buckles, MD  ibuprofen (MOTRIN IB) 200 MG tablet Take 3 tablets (600 mg total) by mouth every 6 (six) hours as needed for mild pain or cramping. 10/03/19   Theresia Majors, MD  insulin aspart  (NOVOLOG) 100 UNIT/ML FlexPen Inject 12 units 3 times daily with meals 05/24/19   Deeann Saint, MD  LANTUS SOLOSTAR 100 UNIT/ML Solostar Pen INJECT 38 UNITS INTO THE SKIN AT BEDTIME. 08/16/19   Deeann Saint, MD  latanoprost (XALATAN) 0.005 % ophthalmic solution Place  1 drop into both eyes at bedtime.    [provider]  LORazepam (ATIVAN) 0.5 MG tablet Take one tab 30 mins prior to dental procedure. 01/09/19   Deeann Saint, MD  losartan (COZAAR) 100 MG tablet TAKE 1 TABLET BY MOUTH EVERY DAY 07/03/19   Deeann Saint, MD  rosuvastatin (CRESTOR) 20 MG tablet TAKE 1 TABLET BY MOUTH EVERY DAY 04/03/19   Deeann Saint, MD  sertraline (ZOLOFT) 50 MG tablet TAKE 1 TABLET BY MOUTH EVERY DAY 07/03/19   Deeann Saint, MD  tiZANidine (ZANAFLEX) 2 MG tablet Take by mouth every 6 (six) hours as needed for muscle spasms.    [provider]  torsemide (DEMADEX) 20 MG tablet TAKE 1 TO 2 TABLETS DAILY AS NEEDED FOR SWELLING 03/29/19   Deeann Saint, MD  varenicline (CHANTIX CONTINUING MONTH PAK) 1 MG tablet Take 1 tablet (1 mg total) by mouth 2 (two) times daily. 07/03/19   Deeann Saint, MD    Allergies    Sitagliptin  Review of Systems   Review of Systems  Unable to perform ROS: Patient unresponsive    Physical Exam Updated Vital Signs BP 91/68    Pulse (!) 135    Resp (!) 32    SpO2 (!) 78%   Physical Exam Vitals and nursing note reviewed.  Constitutional:      General: She is in acute distress.     Appearance: She is well-developed. She is ill-appearing.  HENT:     Head: Normocephalic and atraumatic.  Cardiovascular:     Rate and Rhythm: Regular rhythm. Tachycardia present.     Heart sounds: No murmur heard.   Pulmonary:     Effort: Respiratory distress present.     Comments: Diffuse crackles with bag valve mask ventilation Abdominal:     Palpations: Abdomen is soft.     Tenderness: There is no abdominal tenderness. There is no guarding or rebound.    Musculoskeletal:        General: No tenderness.     Comments: Trace edema to bilateral lower extremities.  Skin:    General: Skin is warm and dry.  Neurological:     Comments: Unresponsive. Moves all extremities weakly but symmetrically.  Psychiatric:     Comments: Unable to assess     ED Results / Procedures / Treatments   Labs (all labs ordered are listed, but only abnormal results are displayed) Labs Reviewed  CBC WITH DIFFERENTIAL/PLATELET - Abnormal; Notable for the following components:      Result Value   HCT 46.4 (*)    Abs Immature Granulocytes 0.16 (*)    All other components within normal limits  LACTIC ACID, PLASMA - Abnormal; Notable for the following components:   Lactic Acid, Venous 7.0 (*)    All other components within normal limits  I-STAT CHEM 8, ED - Abnormal; Notable for the following components:   Glucose, Bld 513 (*)    Calcium, Ion 1.00 (*)    TCO2 18 (*)    Hemoglobin 15.6 (*)    All other components within normal limits  I-STAT ARTERIAL BLOOD GAS, ED - Abnormal; Notable for the following components:   pH, Arterial 7.203 (*)    pCO2 arterial 52.3 (*)    pO2, Arterial 48 (*)    Acid-base deficit 8.0 (*)    Potassium 3.3 (*)    Calcium, Ion 1.14 (*)    All other components within normal limits  SARS CORONAVIRUS 2  BY RT PCR ([place] [facility])  CULTURE, BLOOD (ROUTINE X 2)  CULTURE, BLOOD (ROUTINE X 2)  PROTIME-INR  BRAIN NATRIURETIC PEPTIDE  LACTIC ACID, PLASMA  BLOOD GAS, ARTERIAL  COMPREHENSIVE METABOLIC PANEL  I-STAT ARTERIAL BLOOD GAS, ED  TROPONIN I (HIGH SENSITIVITY)  TROPONIN I (HIGH SENSITIVITY)    EKG EKG Interpretation  Date/Time:  Tuesday December 05 2019 14:24:50 EDT Ventricular Rate:  127 PR Interval:    QRS Duration: 140 QT Interval:  428 QTC Calculation: 623 R Axis:   -43 Text Interpretation: Sinus tachycardia Prominent P waves, nondiagnostic Left bundle branch block Artifact in  lead(s) V1 Confirmed by Quintella Reichert [phone]) on 11/13/2019 3:06:54 PM   Radiology DG Chest Port 1 View  Result Date: 11/23/2019 CLINICAL DATA:  Cardiac arrest. EXAM: PORTABLE CHEST 1 VIEW COMPARISON:  Chest x-ray dated June 02, 2004. FINDINGS: Endotracheal tube tip 1.6 cm above the carina. Enteric tube entering the stomach with the tip below the field of view. The heart size and mediastinal contours are within normal limits. Diffuse opacities throughout both lungs, denser on the right, with relative subpleural sparing. No pneumothorax or large pleural effusion. No acute osseous abnormality. IMPRESSION: 1. Appropriately positioned endotracheal tube. 2. Diffuse opacities throughout both lungs, suspicious for capillary leak pulmonary edema such as from prolonged hypotension given clinical history. Diffuse alveolar hemorrhage or ARDS could have a similar appearance. Electronically Signed   By: Titus Dubin M.D.   On: 11/26/2019 14:43    Procedures Procedures (including critical care time) Angiocath insertion Performed by: Quintella Reichert  Consent: Verbal consent obtained. Risks and benefits: risks, benefits and alternatives were discussed Time out: Immediately prior to procedure a "time out" was called to verify the correct patient, procedure, equipment, support staff and site/side marked as required.  Preparation: Patient was prepped and draped in the usual sterile fashion.  Vein Location: right AC  Ultrasound Guided  Gauge: 18  Normal blood return and flush without difficulty Patient tolerance: Patient tolerated the procedure well with no immediate complications.  Angiocath insertion Performed by: Quintella Reichert  Consent: Verbal consent obtained. Risks and benefits: risks, benefits and alternatives were discussed Time out: Immediately prior to procedure a "time out" was called to verify the correct patient, procedure, equipment, support staff and site/side marked as  required.  Preparation: Patient was prepped and draped in the usual sterile fashion.  Vein Location: left ac  Ultrasound Guided  Gauge: 20  Normal blood return and flush without difficulty Patient tolerance: Patient tolerated the procedure well with no immediate complications.  CRITICAL CARE Performed by: Quintella Reichert   Total critical care time: 45 minutes  Critical care time was exclusive of separately billable procedures and treating other patients.  Critical care was necessary to treat or prevent imminent or life-threatening deterioration.  Critical care was time spent personally by me on the following activities: development of treatment plan with patient and/or surrogate as well as nursing, discussions with consultants, evaluation of patient's response to treatment, examination of patient, obtaining history from patient or surrogate, ordering and performing treatments and interventions, ordering and review of laboratory studies, ordering and review of radiographic studies, pulse oximetry and re-evaluation of patient's condition.    Medications Ordered in ED Medications  sodium chloride 0.9 % bolus 1,000 mL (has no administration in time range)  fentaNYL (SUBLIMAZE) 100 MCG/2ML injection (has no administration in time range)  fentaNYL (SUBLIMAZE) bolus via infusion 25 mcg (has no administration in time range)  norepinephrine (LEVOPHED) 4-5 MG/250ML-% infusion SOLN (has no administration in time range)  norepinephrine (LEVOPHED) 4mg  in 23mL premix infusion (10 mcg/min Intravenous Rate/Dose Change 12/03/2019 1526)  0.9 %  sodium chloride infusion (100 mL/hr Intravenous New Bag/Given 11/27/2019 1430)  docusate (COLACE) 50 MG/5ML liquid 100 mg (has no administration in time range)  polyethylene glycol (MIRALAX / GLYCOLAX) packet 17 g (has no administration in time range)  fentaNYL (SUBLIMAZE) injection 25-100 mcg (has no administration in time range)  fentaNYL (SUBLIMAZE) injection  25 mcg (has no administration in time range)  artificial tears (LACRILUBE) ophthalmic ointment 1 application (has no administration in time range)  propofol (DIPRIVAN) 1000 MG/100ML infusion (has no administration in time range)  fentaNYL (SUBLIMAZE) injection 25 mcg (has no administration in time range)  fentaNYL 2547mcg in NS 237mL (47mcg/ml) infusion-PREMIX (has no administration in time range)  fentaNYL (SUBLIMAZE) bolus via infusion 25 mcg (has no administration in time range)  vecuronium (NORCURON) injection 9.5 mg (has no administration in time range)  etomidate (AMIDATE) injection (20 mg Intravenous Given 11/21/2019 1405)  rocuronium (ZEMURON) injection (100 mg Intravenous Given 12/08/2019 1405)    ED Course  I have reviewed the triage vital signs and the nursing notes.  Pertinent labs & imaging results that were available during my care of the patient were reviewed by me and considered in my medical decision making (see chart for details).    MDM Rules/Calculators/A&P                          Patient presents the emergency department following unwitnessed cardiac arrest. She did have return of circulation prior to ED arrival. On ED arrival patient in respiratory distress receiving bag valve mask ventilation. Oxygen sats in the 60s, able to improve to upper 80s with bagging. RSI was attempted and patient's IV infiltrated and she did not receive an entire dose of rocuronium Please see separate procedure note for RSI. Following intubation there was difficulty maintaining oxygen sats without ventilating with bag valve mask. When attempting to connect the patient to the ventilator she had precipitous drop in her oxygen sats. PEEP was increased and an additional dose of rocuronium was administered to facilitate ventilation. She did develop hypotension and required levophed for pressure support. Critical care consulted for admission for further treatment.  Final Clinical Impression(s) / ED  Diagnoses Final diagnoses:  Respiratory arrest ([place])  ARDS (adult respiratory distress syndrome) ([place])    Rx / DC Orders ED Discharge Orders    None       Quintella Reichert, MD 12/08/2019 [phone]

## 2019-12-05 NOTE — ED Provider Notes (Signed)
  Procedure Name: Intubation Date/Time: 12/11/2019 2:20 PM Performed by: Lorayne Bender, PA-C Pre-anesthesia Checklist: Patient identified, Emergency Drugs available, Suction available, Timeout performed and Patient being monitored Oxygen Delivery Method: Ambu bag Preoxygenation: Pre-oxygenation with 100% oxygen Induction Type: Rapid sequence Ventilation: Mask ventilation with difficulty, Two handed mask ventilation required and Nasal airway inserted- appropriate to patient size Laryngoscope Size: Glidescope Tube size: 7.5 mm Number of attempts: 1 Airway Equipment and Method: Video-laryngoscopy Placement Confirmation: ETT inserted through vocal cords under direct vision,  CO2 detector and Breath sounds checked- equal and bilateral Secured at: 23 cm Tube secured with: ETT holder Dental Injury: Teeth and Oropharynx as per pre-operative assessment  Difficulty Due To: Difficult Airway- due to reduced neck mobility         Layla Maw 11/14/2019 1529    Quintella Reichert, MD 11/24/2019 1542

## 2019-12-05 NOTE — Progress Notes (Signed)
Called for refractory hypoxia, responding to increased PEEP and aggressive ventilation upon arrival, improved more with upright positioning. Pplat 40 on PC 20 + PEEP 22 with Vt 280--> 350. With Vt improved to 350 with good synchrony, transitioned to [facility].   CXR bilateral infiltrates R>L. ETT 1 cm above carina.   CVC and Aline to be placed emergently. Will need ongoing NMB and proning.  Julian Hy, DO 11/21/2019 4:34 PM [facility]

## 2019-12-05 NOTE — Progress Notes (Addendum)
Called to evaluate patient at bedside.  Concern over phypercapnia and high peak pressure of 45 cm H20.  ABG shows pH 7.251 with pCO2 of 45, pO2 275.  Reduced peep to 14 from 22 and peak pressure down to 35 cm H20.  Will repeat ABG in 1 hour and repeat COVID pcr.  If covid pcr negative will proceed with CT of chest and head as ordered.  Suspect pulmonary edema secondary to PEA arrest.  Prognosis poor.  35 minutes spent in addressing critical care issues at bedside.

## 2019-12-05 NOTE — Code Documentation (Signed)
CCM at bedside 

## 2019-12-05 NOTE — Progress Notes (Signed)
[place] Progress Note Patient Name: [name] Bean DOB: [birth year] MRN: [id number]   Date of Service  11/12/2019  HPI/Events of Note  Sinus Tachycardia - Hr = 122. Currently on a Norepinephrine IV infusion.   eICU Interventions  Plan: 1. Phenylephrine IV infusion. Titrate to MAP >= 65.  2. Wean Norepinephrine IV infusion off when Phenylephrine IV infusion is running.  3. ABG now.      Intervention Category Major Interventions: Arrhythmia - evaluation and management  Keiran Sias [name] 11/12/2019, 7:53 PM

## 2019-12-05 NOTE — Progress Notes (Addendum)
[place] Progress Note Patient Name: [name] Bean DOB: Apr 12, 1951 MRN: [id number]   Date of Service  11/25/2019  HPI/Events of Note  ABG on 100%/PRVC 32/ TV 350/P 14 = 7.244/46.4/83. Ppeak = 45. No room to push up PRVC rate or TV. Creatinine = 0.87. BP = 119/63 with MAP = 76. Will attempt gentle diuresis.   eICU Interventions  Plan: 1. Lasix 40 mg IV X 1 now.  2. Monitor CVP now and Q 4 hours.      Intervention Category Major Interventions: Respiratory failure - evaluation and management  [name] Bean [name] 11/23/2019, 11:51 PM

## 2019-12-05 NOTE — Procedures (Signed)
Arterial Catheter Insertion Procedure Note  Sherra Kimmons  [id number]  04/26/1950  Date:11/30/2019  Time:4:15 PM    Provider Performing: Cristal Generous    Procedure: Insertion of Arterial Line [phone]) with US guidance ([id number])   Indication(s) Blood pressure monitoring and/or need for frequent ABGs  Consent Risks of the procedure as well as the alternatives and risks of each were explained to the patient and/or caregiver.  Consent for the procedure was obtained and is signed in the bedside chart  Anesthesia None   Time Out Verified patient identification, verified procedure, site/side was marked, verified correct patient position, special equipment/implants available, medications/allergies/relevant history reviewed, required imaging and test results available.   Sterile Technique Maximal sterile technique including full sterile barrier drape, hand hygiene, sterile gown, sterile gloves, mask, hair covering, sterile ultrasound probe cover (if used).   Procedure Description Area of catheter insertion was cleaned with chlorhexidine and draped in sterile fashion. With real-time ultrasound guidance an arterial catheter was placed into the right radial artery.  Appropriate arterial tracings confirmed on monitor.     Complications/Tolerance None; patient tolerated the procedure well.   EBL Minimal   Specimen(s) None  Eliseo Gum MSN, AGACNP-BC [place] [phone] If no answer, [phone] 11/21/2019, 4:15 PM

## 2019-12-05 NOTE — Progress Notes (Signed)
[place] Progress Note Patient Name: [name] Bean DOB: [birth year] MRN: [id number]   Date of Service  11/25/2019  HPI/Events of Note  Second COVID test is negative.   eICU Interventions  Will D/C droplet precautions.      Intervention Category Major Interventions: Other:  Lysle Dingwall 12/07/2019, 11:38 PM

## 2019-12-05 NOTE — ED Triage Notes (Signed)
Pt BIB [facility] s/p cardiac arrest. Pt called EMS for SOB, during 911 call became unresponsive. On fires arrival, pt pulseless/apneic, fire did 3 rounds of CPR, AED "no shock advised" on EMS arrival fire w/ ROSC. No ACLS meds, EMS did give 2.5mg  versed for combativeness w/ purposeful movements. Pt arrives being bagged by EMS, sats 100%. #20G to R hand.

## 2019-12-05 NOTE — Therapy (Addendum)
[place], [name], [address] Phone: [phone]   Fax:  [phone]  Physical Therapy Treatment/Recertification Progress Note Reporting Period 10/05/19 to 11/23/2019  See note below for Objective Data and Assessment of Progress/Goals.       Patient Details  Name: [name] Bean MRN: [id number] Date of Birth: Sep 07, 1950 Referring Provider (PT): Dr. Ashok Pall    Encounter Date: 12/09/2019   PT End of Session - 11/14/2019 1624    Visit Number 9    Number of Visits 24    Date for PT Re-Evaluation 01/30/20    Authorization Type [employer] MCR, next progress note by visit 19    PT Start Time 1059    PT Stop Time 1146    PT Time Calculation (min) 47 min    Activity Tolerance Patient tolerated treatment well    Behavior During Therapy [facility] for tasks assessed/performed           Past Medical History:  Diagnosis Date  . Anemia   . Anxiety   . Arthritis   . Asthma   . Broken arm    left arm  . Bronchitis   . Bursitis of left hip   . CAD (coronary artery disease)   . Candidiasis, vagina   . Cardiac arrest ([place]) 06/20/2016   at [facility]; ? due to flash pulm edema vs undertreated chronic CHF  . Cerumen impaction   . CHF (congestive heart failure) ([place])    RECENT ADMIT TO [facility]   . Colon, diverticulosis   . Depression   . Diabetes mellitus    15 YRS AGO  . Fatigue   . Gastroenteritis   . Hot flashes, menopausal   . Hyperlipidemia   . Knee pain, left   . Lumbar back pain   . Maxillary sinusitis    history  . Muscle tear    right gluteus  . Nausea   . Nicotine dependence   . OSA (obstructive sleep apnea) 10/16/2017  . Other B-complex deficiencies   . Otitis media, acute    left  . Peripheral neuropathy   . Spinal stenosis of lumbar region   . Tubulovillous adenoma of colon   . Unspecified essential hypertension     Past Surgical History:  Procedure Laterality Date  . CARDIAC  CATHETERIZATION  2015, 2009, 2006  . CERVICAL SPINE SURGERY  2003  . CORONARY ANGIOPLASTY WITH STENT PLACEMENT  2002, 2015  . DILATATION & CURETTAGE/HYSTEROSCOPY WITH MYOSURE N/A 10/03/2019   Procedure: DILATATION & CURETTAGE/HYSTEROSCOPY WITH MYOSURE;  Surgeon: [name], MD;  Location: [place];  Service: Gynecology;  Laterality: N/A;  request 7:30am OR time in [facility] Gynecology requests 30 minutes OR time  . EYE SURGERY     cataracts bilaterally  . POSTERIOR CERVICAL LAMINECTOMY N/A 10/23/2016   Procedure: POSTERIOR CERVICAL LAMINECTOMY MULTI LEVEL CERVICAL TWO- CERVICAL THREE, CERVICAL THREE- CERVICAL FOUR;  Surgeon: Ashok Pall, MD;  Location: [place];  Service: Neurosurgery;  Laterality: N/A;  POSTERIOR    There were no vitals filed for this visit.   Subjective Assessment - 11/28/2019 1621    Subjective Pt. returns, not seen for therapy for the past 2 weeks-she had planned travel out of town to visit friend but trip was cancelled due to friend being sick. Right hip pain 2/10 this AM-hip overall improving but still with intermittent symptom exacerbations particularly with rainy weather.    Pertinent History Jan 2020 CTS, cervical laminectomy/fusion, cardiac, diabetes, ataxia,  falls    Limitations Standing;Walking;House hold activities    How long can you walk comfortably? with a cane she wears out and with a rolling walking or buggy she is fine    Diagnostic tests none recent    Patient Stated Goals I want to conquer the steps and I want to move more    Currently in Pain? Yes    Pain Score 2     Pain Location Hip    Pain Orientation Right;Lateral;Posterior    Pain Descriptors / Indicators Aching    Pain Type Chronic pain    Pain Onset More than a month ago    Pain Frequency Intermittent    Aggravating Factors  weather, activity    Pain Relieving Factors heat, massage, stretches              [facility] PT Assessment - 11/20/2019 0001      Observation/Other  Assessments   Focus on Therapeutic Outcomes (FOTO)  60% limited      Strength   Right Hip Flexion 4+/5    Right Hip Extension 4/5    Right Hip ABduction 4/5    Left Hip Flexion 4+/5    Left Hip Extension 4/5    Left Hip ABduction 4/5    Right Knee Flexion 5/5    Right Knee Extension 5/5    Left Knee Flexion 5/5    Left Knee Extension 5/5      Transfers   Five time sit to stand comments  11 sec      Berg Balance Test   Sit to Stand Able to stand without using hands and stabilize independently    Standing Unsupported Able to stand safely 2 minutes    Sitting with Back Unsupported but Feet Supported on Floor or Stool Able to sit safely and securely 2 minutes    Stand to Sit Sits safely with minimal use of hands    Transfers Able to transfer safely, minor use of hands    Standing Unsupported with Eyes Closed Able to stand 10 seconds safely    Standing Unsupported with Feet Together Able to place feet together independently and stand for 1 minute with supervision    From Standing, Reach Forward with Outstretched Arm Can reach forward >12 cm safely (5")    From Standing Position, Pick up Object from Floor Able to pick up shoe, needs supervision    From Standing Position, Turn to Look Behind Over each Shoulder Looks behind one side only/other side shows less weight shift    Turn 360 Degrees Able to turn 360 degrees safely but slowly    Standing Unsupported, Alternately Place Feet on Step/Stool Able to stand independently and safely and complete 8 steps in 20 seconds    Standing Unsupported, One Foot in Front Able to plae foot ahead of the other independently and hold 30 seconds    Standing on One Leg Tries to lift leg/unable to hold 3 seconds but remains standing independently    Total Score 46                         [facility] Adult PT Treatment/Exercise - 11/30/2019 0001      Neuro Re-ed    Neuro Re-ed Details  Berg components with SLS, toe taps, standing balance eyes  open/closed per Merrilee Jansky components      Knee/Hip Exercises: Stretches   ITB Stretch Right;3 reps;20 seconds    Piriformis Stretch Right;3 reps;20 seconds  Knee/Hip Exercises: Standing   Hip Abduction AROM;Stengthening;Both;2 sets;10 reps    Abduction Limitations 3 lbs.    Forward Step Up Right;Left;10 reps;Step Height: 4"      Manual Therapy   Soft tissue mobilization STM Rt glute medius, max and piriformis            Trigger Point Dry Needling - 11/25/2019 0001    Consent Given? Yes    Muscles Treated Back/Hip Gluteus minimus;Gluteus medius;Gluteus maximus;Piriformis    Dry Needling Comments needling in left sidelying with 30 gauge 50 mm needles    Electrical Stimulation Performed with Dry Needling Yes    E-stim with Dry Needling Details TENS 20 pps x 10 minutes                PT Education - 11/28/2019 1624    Education Details POC    Person(s) Educated Patient    Methods Explanation    Comprehension Verbalized understanding            PT Short Term Goals - 12/10/2019 1630      PT SHORT TERM GOAL #1   Title Pt will complete balance re-assessment and understand results, set goal for this episode.    Baseline Berg previously assessed, retested 12/11/2019    Time 4    Period Weeks    Status Achieved      PT SHORT TERM GOAL #2   Title Pt will be I with simple HEP for hip, core and balance    Baseline updates ongoing, continue goal, current HEP focus for hip    Time 4    Period Weeks             PT Long Term Goals - 11/18/2019 1634      PT LONG TERM GOAL #1   Title Pt will be able to negotiate 8 steps with 1 rail and improved confidence, less dependence on UEs, reciprocal pattern.    Baseline can do this especially after PT, limited mostly by ataxia, tone in LEs    Time 8    Period Weeks    Status On-going    Target Date 01/30/20      PT LONG TERM GOAL #2   Title Pt will be able to lie on Rt hip at night without increased pain    Baseline hip pain  improving but goal ongoing    Time 8    Period Weeks    Status On-going      PT LONG TERM GOAL #3   Title FOTO score will improve to 45% to demo improved functional mobility    Baseline 60% limited    Time 8    Period Weeks    Status On-going    Target Date 01/30/20      PT LONG TERM GOAL #4   Title Patient will increase hip AROM in sitting to be able to put Rt foot on Lt thigh.    Baseline partially met    Time 8    Period Weeks    Status Partially Met      PT LONG TERM GOAL #5   Title Improve Berg Balance test score at least 3-5 points for decreased fall risk    Baseline 46/54    Time 8    Period Weeks    Status On-going    Target Date 01/30/20                 Plan - 11/16/2019 1643  Clinical Impression Statement Pt. has made moderate improvements in decreased hip pain from baseline status with associated functional gains for walking tolerance. She continues with hip weakness and gait/balance difficulties with Berg score still suggestive of increased fall risk. Plan continue PT for further progress to address mobility limitations and improve safety for gait/decreased fall risk. Addendum: Per chart patient presented to ED later in the day of therapy session for respiratory arrest emergency-had planned continue PT for hip but status for therapy will be pending medical status and subsequent clearance for activity upon d/c from hospital.    Personal Factors and Comorbidities Comorbidity 3+;Past/Current Experience;Time since onset of injury/illness/exacerbation    Comorbidities cervical myelopathy with fusion, ataxia, chronic pain    Examination-Activity Limitations Dressing;Transfers;Sleep;Bend;Lift;Squat;Locomotion Level;Stand;Reach Overhead    Examination-Participation Restrictions Laundry;Cleaning;Meal Prep;Community Activity;Interpersonal Relationship    Stability/Clinical Decision Making Stable/Uncomplicated    Clinical Decision Making Low    Rehab Potential Good     PT Frequency 2x / week    PT Duration 8 weeks    PT Treatment/Interventions ADLs/Self Care Home Management;Moist Heat;Gait training;Stair training;Balance training;Therapeutic activities;Patient/family education;Therapeutic exercise;Ultrasound;Functional mobility training;Dry needling;Manual techniques;Passive range of motion;Neuromuscular re-education    PT Next Visit Plan await status for medical clearance for PT s/p hospitalization-pending status continue work on hip strengthening, stretches, balance work, manual prn (de-emphasize), further dry needling prn    PT Home Exercise Plan [id number]: hip stretching all muscle groups    Consulted and Agree with Plan of Care Patient           Patient will benefit from skilled therapeutic intervention in order to improve the following deficits and impairments:  Abnormal gait, Decreased balance, Decreased endurance, Decreased mobility, Difficulty walking, Obesity, Decreased range of motion, Decreased strength, Increased fascial restricitons, Impaired flexibility, Impaired UE functional use, Postural dysfunction, Pain  Visit Diagnosis: Pain in right hip  Stiffness of right hip, not elsewhere classified     Problem List Patient Active Problem List   Diagnosis Date Noted  . Shock ([place])   . ARDS (adult respiratory distress syndrome) ([place])   . OSA (obstructive sleep apnea) 10/16/2017  . Cervical stenosis of spinal canal 10/23/2016  . Neck swelling 09/28/2011  . Weight gain 01/02/2011  . Carpal tunnel syndrome 11/02/2010  . Leg swelling 08/26/2010  . ADRENAL MASS, LEFT 05/23/2010  . SPINAL STENOSIS, LUMBAR 12/27/2009  . B12 DEFICIENCY 06/10/2009  . ANEMIA 06/10/2009  . BACK PAIN, LUMBAR 11/06/2008  . KNEE PAIN, LEFT 02/03/2008  . DIABETES MELLITUS, TYPE II, UNCONTROLLED 01/12/2008  . MENOPAUSE-RELATED VASOMOTOR SYMPTOMS, HOT FLASHES 06/23/2007  . BURSITIS, LEFT HIP 06/23/2007  . HYPERLIPIDEMIA 03/25/2007  . ANXIETY 03/25/2007  . DEPRESSION  03/25/2007  . PERIPHERAL NEUROPATHY 03/25/2007  . HYPERTENSION 03/25/2007  . CORONARY ARTERY DISEASE 03/25/2007  . DIVERTICULOSIS, COLON 03/25/2007  . TUBULOVILLOUS ADENOMA, COLON 02/18/2007   [name] Bean, PT, DPT 11/15/2019 4:50 PM  [facility] [address] [place], [name], [address] Phone: [phone]   Fax:  [phone]  Name: [name] Bean MRN: [id number] Date of Birth: 1950/08/14   PHYSICAL THERAPY DISCHARGE SUMMARY  Visits from Start of Care: 9  Current functional level related to goals / functional outcomes: See last visit    Remaining deficits: See last visit   Education / Equipment: HEP, dry needling, core   Plan: Patient agrees to discharge.  Patient goals were not met. Patient is being discharged due to a change in medical status.  ?????    Patient is  deceased.   Raeford Razor, PT 12/19/19 12:52 PM Phone: [phone] Fax: [phone]

## 2019-12-05 NOTE — H&P (Signed)
NAME:  [name] Bean, MRN:  [id number], DOB:  December 12, 1950, LOS: 0 ADMISSION DATE:  11/21/2019, CONSULTATION DATE:  11/30/2019 REFERRING MD:  Ralene Bathe - EM , CHIEF COMPLAINT:  Respiratory arrest, hypoxemia   Brief History   69yo female presented to ED after PEA cardiac arrest likely due to respiratory failure.  History of present illness   [name] Bean is a 69 y.o. with PHX significant for HTN, HLD, DM, CHF, cardiac arrest, CAD, and anxiety who presented to ED s/p cardiac arrest. Per report patient called EMS with acute complaints of SOB and on arrival EMS found patient unresponsive in PEA arrest. EMS provided 3 rounds of CPR and obtained ROSC prior to ED arrival. No ACLS medications were given. PCCM called urgently to evaluate pt in ED due to hypoxemia.   CXR with bilateral ASD, concerning for possible ARDS. ABG 7.2/52/48 Labs acquired in ED are hemolyzed, resent but unavailable for review   Past Medical History   Diastolic heart failure HTN DM Prior PEA arrest CAD   Significant Hospital Events   8/24 BIB Ems for CC SOB. On EMS arrival, pulseless and hypoxemic. ROSC achieved. PCCM consulted for hypoxemia shortly after intubation. CVC and Aline placed by PCCM in ED  Consults:    Procedures:  8/24 ETT> 8/24 CVC> 8/24 Aline>  Significant Diagnostic Tests:  8/24 CXR> Diffuse bilateral pulmonary opacities. ETT 2.5cm above carina.  8/24 CTA chest>> 8/24 CT H >>   Micro Data:  8/24 SARS Cov2> neg 8/24 RVP >> 8/24 Tracheal aspirate >> 8/24 BCx>>   Antimicrobials:  Unasyn 8/24   Interim history/subjective:  Intubated in ED On low dose pressors  Objective   Blood pressure (!) 126/52, pulse (!) 135, resp. rate (!) 32, height $RemoveBe'5\' 2"'TmaJOMYLa$  (1.575 m), weight 95.1 kg, SpO2 (!) 78 %.       No intake or output data in the 24 hours ending 11/29/2019 1630 Filed Weights   11/16/2019 1600  Weight: 95.1 kg    Examination: General: Obese, critically ill appearing F,  intubated sedated NAD HENT: NCAT pink mm, trachea  midline, anicteric sclera. LIJ CVC Lungs: Coarse bilaterally with scattered rhonchi. Mechanically ventilated Cardiovascular: RRR s1s2 no rgm  Abdomen: Obese soft round ndnt  Extremities: No obvious joint deformity. No cyanosis or clubbing. R radial art line. Non-pitting edema Neuro: Sedate. Does not follow commands.  GU: defer  Resolved Hospital Problem list     Assessment & Plan:   S/p PEA cardiac arrest -likely respiratory arrest.  -prior PEA arrest in 2018, attributed to pulm edema  Shock  -circulatory +/- CNS depressing meds, +/- underlying process  P -CTA chest -CT H -ECHO -A line and CVC placed in ED -- NE for MAP > 65 -ddimer, trops  -avoid fever  Acute respiratory failure with hypoxemia requiring intubation -COVID-19 neg  -CXR with bilat infiltrates c/f ARDS vs pulm edema?  Hx OSA on CPAP P -CTA chest, r/o PE  -RVP  -trach aspirate -Prone, neuromuscular blockade  -Lung protective ventilation    Diastolic heart failure  -has graduated from Dr. B HF clinic, April 2021 -- EF 60%  Hx HTN P -hold home meds in setting of shock  -reassess volume status after CTA, ECHO  Lactic acidosis -likely in setting of cardiac arrest. P -initial labs are hemolyzed -- repeat admit labs, trend LA   DM  -SSI   Best practice:  Diet: EN  Pain/Anxiety/Delirium protocol (if indicated): Fent, prop VAP protocol (if indicated): Yes  DVT prophylaxis: SQ Heparin GI prophylaxis: PPI  Glucose control: SSI  Mobility: BR Code Status: Full  Family Communication: pending Disposition: admit to ICU   Labs   CBC: Recent Labs  Lab 12/11/2019 1418 12/09/2019 1422 11/14/2019 1455  WBC  --  8.7  --   NEUTROABS  --  4.4  --   HGB 15.6* 14.9 14.3  HCT 46.0 46.4* 42.0  MCV  --  93.7  --   PLT  --  272  --     Basic Metabolic Panel: Recent Labs  Lab 12/12/2019 1418 12/11/2019 1455 12/04/2019 1530  NA 135 139 136  K 4.6 3.3* 3.8  CL  103  --  107  CO2  --   --  19*  GLUCOSE 513*  --  441*  BUN 12  --  11  CREATININE 0.80  --  0.87  CALCIUM  --   --  7.5*   GFR: Estimated Creatinine Clearance: 67.5 mL/min (by C-G formula based on SCr of 0.87 mg/dL). Recent Labs  Lab 11/30/2019 1422  WBC 8.7  LATICACIDVEN 7.0*    Liver Function Tests: Recent Labs  Lab 11/28/2019 1530  AST 38  ALT 21  ALKPHOS 106  BILITOT 0.5  PROT 5.9*  ALBUMIN 2.8*   No results for input(s): LIPASE, AMYLASE in the last 168 hours. No results for input(s): AMMONIA in the last 168 hours.  ABG    Component Value Date/Time   PHART 7.203 (L) 11/27/2019 1455   PCO2ART 52.3 (H) 12/04/2019 1455   PO2ART 48 (L) 11/30/2019 1455   HCO3 20.6 11/30/2019 1455   TCO2 22 12/04/2019 1455   ACIDBASEDEF 8.0 (H) 11/25/2019 1455   O2SAT 74.0 11/12/2019 1455     Coagulation Profile: Recent Labs  Lab 11/23/2019 1422  INR 1.1    Cardiac Enzymes: No results for input(s): CKTOTAL, CKMB, CKMBINDEX, TROPONINI in the last 168 hours.  HbA1C: Hemoglobin A1C  Date/Time Value Ref Range Status  06/07/2018 05:00 PM 10.6 (A) 4.0 - 5.6 % Final  09/28/2017 05:15 PM 8.0 (A) 4.0 - 5.6 % Final   Hgb A1c MFr Bld  Date/Time Value Ref Range Status  10/19/2016 03:03 PM 7.6 (H) 4.8 - 5.6 % Final    Comment:    (NOTE)         Pre-diabetes: 5.7 - 6.4         Diabetes: >6.4         Glycemic control for adults with diabetes: <7.0   09/09/2016 11:52 AM 8.1 (H) 4.8 - 5.6 % Final    Comment:    (NOTE)         Pre-diabetes: 5.7 - 6.4         Diabetes: >6.4         Glycemic control for adults with diabetes: <7.0     CBG: No results for input(s): GLUCAP in the last 168 hours.  Review of Systems:   Unable to obtain, intubated sedated  SOB was CC for EMS call   Past Medical History  She,  has a past medical history of Anemia, Anxiety, Arthritis, Asthma, Broken arm, Bronchitis, Bursitis of left hip, CAD (coronary artery disease), Candidiasis, vagina, Cardiac  arrest ([facility]) (06/20/2016), Cerumen impaction, CHF (congestive heart failure) ([facility]), Colon, diverticulosis, Depression, Diabetes mellitus, Fatigue, Gastroenteritis, Hot flashes, menopausal, Hyperlipidemia, Knee pain, left, Lumbar back pain, Maxillary sinusitis, Muscle tear, Nausea, Nicotine dependence, OSA (obstructive sleep apnea) (10/16/2017), Other B-complex deficiencies, Otitis media, acute, Peripheral neuropathy, Spinal  stenosis of lumbar region, Tubulovillous adenoma of colon, and Unspecified essential hypertension.   Surgical History    Past Surgical History:  Procedure Laterality Date  . CARDIAC CATHETERIZATION  2015, 2009, 2006  . CERVICAL SPINE SURGERY  2003  . CORONARY ANGIOPLASTY WITH STENT PLACEMENT  2002, 2015  . DILATATION & CURETTAGE/HYSTEROSCOPY WITH MYOSURE N/A 10/03/2019   Procedure: DILATATION & CURETTAGE/HYSTEROSCOPY WITH MYOSURE;  Surgeon: [name], MD;  Location: [place];  Service: Gynecology;  Laterality: N/A;  request 7:30am OR time in [facility] Gynecology requests 30 minutes OR time  . EYE SURGERY     cataracts bilaterally  . POSTERIOR CERVICAL LAMINECTOMY N/A 10/23/2016   Procedure: POSTERIOR CERVICAL LAMINECTOMY MULTI LEVEL CERVICAL TWO- CERVICAL THREE, CERVICAL THREE- CERVICAL FOUR;  Surgeon: Ashok Pall, MD;  Location: [place];  Service: Neurosurgery;  Laterality: N/A;  POSTERIOR     Social History   reports that she has been smoking cigarettes. She has a 11.50 pack-year smoking history. She has never used smokeless tobacco. She reports current alcohol use. She reports current drug use. Drug: Marijuana.   Family History   Her family history includes Alcohol abuse in her father; Diabetes in her mother; Hypertension in her mother; Pneumonia in her father.   Allergies Allergies  Allergen Reactions  . Sitagliptin Other (See Comments)    Had pancreatitis while on Januvia      Home Medications  Prior to Admission medications   Medication  Sig Start Date End Date Taking? Authorizing Provider  acetaminophen (TYLENOL) 500 MG tablet Take 2 tablets (1,000 mg total) by mouth every 6 (six) hours as needed for mild pain. 10/03/19   [name], MD  aspirin EC 81 MG tablet Take 81 mg by mouth daily.     [provider]  BD PEN NEEDLE NANO U/F 32G X 4 MM MISC USE TO INJECT INSULIN 5 TIMES A DAY 03/15/13   Yoo, Doe-Hyun R, DO  bimatoprost (LUMIGAN) 0.01 % SOLN Lumigan 0.01 % eye drops  PUT 1 DROP INTO BOTH EYES AT BEDTIME    [provider]  Blood Glucose Monitoring Suppl (ACCU-CHEK COMPACT CARE KIT) KIT Accu-Chek Compact Plus Care kit  USE AS INSTRUCTED.    [provider]  carvedilol (COREG) 25 MG tablet Take 1 tablet (25 mg total) by mouth in the morning and at bedtime. 07/31/19   Bensimhon, Shaune Pascal, MD  glucose blood (FREESTYLE TEST STRIPS) test strip 1 each by Other route daily. (accu-chek guide) DX E11.9 08/06/17   Billie Ruddy, MD  hydrALAZINE (APRESOLINE) 50 MG tablet Take 1 tablet (50 mg total) by mouth in the morning and at bedtime. 07/31/19   Bensimhon, Shaune Pascal, MD  ibuprofen (MOTRIN IB) 200 MG tablet Take 3 tablets (600 mg total) by mouth every 6 (six) hours as needed for mild pain or cramping. 10/03/19   [name], MD  insulin aspart (NOVOLOG) 100 UNIT/ML FlexPen Inject 12 units 3 times daily with meals 05/24/19   Billie Ruddy, MD  LANTUS SOLOSTAR 100 UNIT/ML Solostar Pen INJECT 38 UNITS INTO THE SKIN AT BEDTIME. 08/16/19   Billie Ruddy, MD  latanoprost (XALATAN) 0.005 % ophthalmic solution Place 1 drop into both eyes at bedtime.    [provider]  LORazepam (ATIVAN) 0.5 MG tablet Take one tab 30 mins prior to dental procedure. 01/09/19   Billie Ruddy, MD  losartan (COZAAR) 100 MG tablet TAKE 1 TABLET BY MOUTH EVERY DAY 07/03/19   Volanda Napoleon,  Langley Adie, MD  rosuvastatin (CRESTOR) 20 MG tablet TAKE 1 TABLET BY MOUTH EVERY DAY 04/03/19   Billie Ruddy, MD  sertraline (ZOLOFT) 50  MG tablet TAKE 1 TABLET BY MOUTH EVERY DAY 07/03/19   Billie Ruddy, MD  tiZANidine (ZANAFLEX) 2 MG tablet Take by mouth every 6 (six) hours as needed for muscle spasms.    [provider]  torsemide (DEMADEX) 20 MG tablet TAKE 1 TO 2 TABLETS DAILY AS NEEDED FOR SWELLING 03/29/19   Billie Ruddy, MD  varenicline (CHANTIX CONTINUING MONTH PAK) 1 MG tablet Take 1 tablet (1 mg total) by mouth 2 (two) times daily. 07/03/19   Billie Ruddy, MD     Critical care time: 65 minutes        CRITICAL CARE Performed by: Cristal Generous  Total critical care time: 65 minutes  Critical care time was exclusive of separately billable procedures and treating other patients. Critical care was necessary to treat or prevent imminent or life-threatening deterioration.  Critical care was time spent personally by me on the following activities: development of treatment plan with patient and/or surrogate as well as nursing, discussions with consultants, evaluation of patient's response to treatment, examination of patient, obtaining history from patient or surrogate, ordering and performing treatments and interventions, ordering and review of laboratory studies, ordering and review of radiographic studies, pulse oximetry and re-evaluation of patient's condition.  Eliseo Gum MSN, AGACNP-BC [place] [phone] If no answer, [phone] 11/24/2019, 5:27 PM

## 2019-12-05 NOTE — Progress Notes (Signed)
  Echocardiogram 2D Echocardiogram with definity has been performed.  [name] Bean M 11/22/2019, 5:01 PM

## 2019-12-06 ENCOUNTER — Inpatient Hospital Stay (HOSPITAL_COMMUNITY): Payer: Medicare PPO

## 2019-12-06 ENCOUNTER — Ambulatory Visit: Payer: Medicare PPO | Admitting: Cardiovascular Disease

## 2019-12-06 DIAGNOSIS — I214 Non-ST elevation (NSTEMI) myocardial infarction: Secondary | ICD-10-CM

## 2019-12-06 DIAGNOSIS — I447 Left bundle-branch block, unspecified: Secondary | ICD-10-CM

## 2019-12-06 LAB — GLUCOSE, CAPILLARY
Glucose-Capillary: 216 mg/dL — ABNORMAL HIGH (ref 70–99)
Glucose-Capillary: 269 mg/dL — ABNORMAL HIGH (ref 70–99)
Glucose-Capillary: 215 mg/dL — ABNORMAL HIGH (ref 70–99)
Glucose-Capillary: 185 mg/dL — ABNORMAL HIGH (ref 70–99)
Glucose-Capillary: 211 mg/dL — ABNORMAL HIGH (ref 70–99)
Glucose-Capillary: 151 mg/dL — ABNORMAL HIGH (ref 70–99)
Glucose-Capillary: 174 mg/dL — ABNORMAL HIGH (ref 70–99)
Glucose-Capillary: 124 mg/dL — ABNORMAL HIGH (ref 70–99)
Glucose-Capillary: 142 mg/dL — ABNORMAL HIGH (ref 70–99)
Glucose-Capillary: 146 mg/dL — ABNORMAL HIGH (ref 70–99)
Glucose-Capillary: 138 mg/dL — ABNORMAL HIGH (ref 70–99)
Glucose-Capillary: 151 mg/dL — ABNORMAL HIGH (ref 70–99)
Glucose-Capillary: 198 mg/dL — ABNORMAL HIGH (ref 70–99)
Glucose-Capillary: 184 mg/dL — ABNORMAL HIGH (ref 70–99)

## 2019-12-06 LAB — TRIGLYCERIDES: Triglycerides: 160 mg/dL — ABNORMAL HIGH (ref <150)

## 2019-12-06 LAB — POCT I-STAT 7, (LYTES, BLD GAS, ICA,H+H)
Acid-base deficit: 2 mmol/L (ref 0.0–2.0)
Acid-base deficit: 3 mmol/L — ABNORMAL HIGH (ref 0.0–2.0)
Acid-base deficit: 1 mmol/L (ref 0.0–2.0)
Bicarbonate: 22.5 mmol/L (ref 20.0–28.0)
Bicarbonate: 20.4 mmol/L (ref 20.0–28.0)
Bicarbonate: 21.5 mmol/L (ref 20.0–28.0)
Calcium, Ion: 1.13 mmol/L — ABNORMAL LOW (ref 1.15–1.40)
Calcium, Ion: 1.11 mmol/L — ABNORMAL LOW (ref 1.15–1.40)
Calcium, Ion: 1.07 mmol/L — ABNORMAL LOW (ref 1.15–1.40)
HCT: 39 % (ref 36.0–46.0)
HCT: 36 % (ref 36.0–46.0)
HCT: 35 % — ABNORMAL LOW (ref 36.0–46.0)
Hemoglobin: 13.3 g/dL (ref 12.0–15.0)
Hemoglobin: 12.2 g/dL (ref 12.0–15.0)
Hemoglobin: 11.9 g/dL — ABNORMAL LOW (ref 12.0–15.0)
O2 Saturation: 100 %
O2 Saturation: 95 %
O2 Saturation: 98 %
Patient temperature: 37.5
Patient temperature: 98.6
Patient temperature: 98.6
Potassium: 3.6 mmol/L (ref 3.5–5.1)
Potassium: 3.3 mmol/L — ABNORMAL LOW (ref 3.5–5.1)
Potassium: 3.8 mmol/L (ref 3.5–5.1)
Sodium: 143 mmol/L (ref 135–145)
Sodium: 144 mmol/L (ref 135–145)
Sodium: 140 mmol/L (ref 135–145)
TCO2: 24 mmol/L (ref 22–32)
TCO2: 21 mmol/L — ABNORMAL LOW (ref 22–32)
TCO2: 22 mmol/L (ref 22–32)
pCO2 arterial: 38.4 mmHg (ref 32.0–48.0)
pCO2 arterial: 29.3 mmHg — ABNORMAL LOW (ref 32.0–48.0)
pCO2 arterial: 29.6 mmHg — ABNORMAL LOW (ref 32.0–48.0)
pH, Arterial: 7.378 (ref 7.350–7.450)
pH, Arterial: 7.45 (ref 7.350–7.450)
pH, Arterial: 7.469 — ABNORMAL HIGH (ref 7.350–7.450)
pO2, Arterial: 282 mmHg — ABNORMAL HIGH (ref 83.0–108.0)
pO2, Arterial: 71 mmHg — ABNORMAL LOW (ref 83.0–108.0)
pO2, Arterial: 97 mmHg (ref 83.0–108.0)

## 2019-12-06 LAB — BASIC METABOLIC PANEL
Anion gap: 9 (ref 5–15)
BUN: 13 mg/dL (ref 8–23)
CO2: 20 mmol/L — ABNORMAL LOW (ref 22–32)
Calcium: 8.1 mg/dL — ABNORMAL LOW (ref 8.9–10.3)
Chloride: 110 mmol/L (ref 98–111)
Creatinine, Ser: 0.96 mg/dL (ref 0.44–1.00)
GFR calc Af Amer: >60 mL/min (ref >60)
GFR calc non Af Amer: >60 mL/min (ref >60)
Glucose, Bld: 174 mg/dL — ABNORMAL HIGH (ref 70–99)
Potassium: 3.6 mmol/L (ref 3.5–5.1)
Sodium: 139 mmol/L (ref 135–145)

## 2019-12-06 LAB — COMPREHENSIVE METABOLIC PANEL
ALT: 24 U/L (ref 0–44)
AST: 59 U/L — ABNORMAL HIGH (ref 15–41)
Albumin: 2.8 g/dL — ABNORMAL LOW (ref 3.5–5.0)
Alkaline Phosphatase: 84 U/L (ref 38–126)
Anion gap: 12 (ref 5–15)
BUN: 13 mg/dL (ref 8–23)
CO2: 21 mmol/L — ABNORMAL LOW (ref 22–32)
Calcium: 8.1 mg/dL — ABNORMAL LOW (ref 8.9–10.3)
Chloride: 107 mmol/L (ref 98–111)
Creatinine, Ser: 0.95 mg/dL (ref 0.44–1.00)
GFR calc Af Amer: >60 mL/min (ref >60)
GFR calc non Af Amer: >60 mL/min (ref >60)
Glucose, Bld: 219 mg/dL — ABNORMAL HIGH (ref 70–99)
Potassium: 3.8 mmol/L (ref 3.5–5.1)
Sodium: 140 mmol/L (ref 135–145)
Total Bilirubin: 0.7 mg/dL (ref 0.3–1.2)
Total Protein: 6 g/dL — ABNORMAL LOW (ref 6.5–8.1)

## 2019-12-06 LAB — TROPONIN I (HIGH SENSITIVITY): Troponin I (High Sensitivity): 6658 ng/L (ref <18)

## 2019-12-06 LAB — CBC
HCT: 38.6 % (ref 36.0–46.0)
Hemoglobin: 13.4 g/dL (ref 12.0–15.0)
MCH: 31.7 pg (ref 26.0–34.0)
MCHC: 34.7 g/dL (ref 30.0–36.0)
MCV: 91.3 fL (ref 80.0–100.0)
Platelets: 189 10*3/uL (ref 150–400)
RBC: 4.23 MIL/uL (ref 3.87–5.11)
RDW: 12.4 % (ref 11.5–15.5)
WBC: 18.7 10*3/uL — ABNORMAL HIGH (ref 4.0–10.5)
nRBC: 0 % (ref 0.0–0.2)

## 2019-12-06 LAB — MAGNESIUM
Magnesium: 1.6 mg/dL — ABNORMAL LOW (ref 1.7–2.4)
Magnesium: 2.5 mg/dL — ABNORMAL HIGH (ref 1.7–2.4)

## 2019-12-06 LAB — PHOSPHORUS
Phosphorus: 1.7 mg/dL — ABNORMAL LOW (ref 2.5–4.6)
Phosphorus: 4.4 mg/dL (ref 2.5–4.6)

## 2019-12-06 LAB — HEPARIN LEVEL (UNFRACTIONATED): Heparin Unfractionated: 0.33 IU/mL (ref 0.30–0.70)

## 2019-12-06 LAB — MRSA PCR SCREENING: MRSA by PCR: NEGATIVE

## 2019-12-06 MED ORDER — MAGNESIUM SULFATE 4 GM/100ML IV SOLN
4.0000 g | Freq: Once | INTRAVENOUS | Status: AC
  Administered 2019-12-06: 4 g via INTRAVENOUS
  Filled 2019-12-06: qty 100

## 2019-12-06 MED ORDER — POTASSIUM CHLORIDE 20 MEQ/15ML (10%) PO SOLN
40.0000 meq | Freq: Once | ORAL | Status: AC
  Administered 2019-12-06: 40 meq via ORAL
  Filled 2019-12-06: qty 30

## 2019-12-06 MED ORDER — SODIUM CHLORIDE 0.9% FLUSH
10.0000 mL | Freq: Two times a day (BID) | INTRAVENOUS | Status: DC
  Administered 2019-12-06 – 2019-12-17 (×21): 10 mL

## 2019-12-06 MED ORDER — POTASSIUM PHOSPHATES 15 MMOLE/5ML IV SOLN
30.0000 mmol | Freq: Once | INTRAVENOUS | Status: AC
  Administered 2019-12-06: 30 mmol via INTRAVENOUS
  Filled 2019-12-06: qty 10

## 2019-12-06 MED ORDER — INSULIN ASPART 100 UNIT/ML ~~LOC~~ SOLN
3.0000 [IU] | SUBCUTANEOUS | Status: DC
  Administered 2019-12-06: 3 [IU] via SUBCUTANEOUS
  Administered 2019-12-06 (×2): 6 [IU] via SUBCUTANEOUS
  Administered 2019-12-07: 3 [IU] via SUBCUTANEOUS
  Administered 2019-12-07 (×2): 9 [IU] via SUBCUTANEOUS
  Administered 2019-12-07: 6 [IU] via SUBCUTANEOUS
  Administered 2019-12-07: 9 [IU] via SUBCUTANEOUS
  Administered 2019-12-07: 3 [IU] via SUBCUTANEOUS
  Administered 2019-12-08: 6 [IU] via SUBCUTANEOUS
  Administered 2019-12-08 – 2019-12-09 (×3): 9 [IU] via SUBCUTANEOUS
  Administered 2019-12-09 (×3): 6 [IU] via SUBCUTANEOUS
  Administered 2019-12-09: 9 [IU] via SUBCUTANEOUS
  Administered 2019-12-09: 6 [IU] via SUBCUTANEOUS
  Administered 2019-12-10 (×2): 9 [IU] via SUBCUTANEOUS
  Administered 2019-12-10: 6 [IU] via SUBCUTANEOUS
  Administered 2019-12-10: 3 [IU] via SUBCUTANEOUS
  Administered 2019-12-10: 6 [IU] via SUBCUTANEOUS
  Administered 2019-12-10: 9 [IU] via SUBCUTANEOUS
  Administered 2019-12-11: 6 [IU] via SUBCUTANEOUS
  Administered 2019-12-11: 9 [IU] via SUBCUTANEOUS
  Administered 2019-12-11: 6 [IU] via SUBCUTANEOUS
  Administered 2019-12-11: 9 [IU] via SUBCUTANEOUS
  Administered 2019-12-11: 6 [IU] via SUBCUTANEOUS
  Administered 2019-12-11 – 2019-12-12 (×2): 3 [IU] via SUBCUTANEOUS
  Administered 2019-12-12: 9 [IU] via SUBCUTANEOUS
  Administered 2019-12-12 – 2019-12-13 (×3): 3 [IU] via SUBCUTANEOUS
  Administered 2019-12-13: 9 [IU] via SUBCUTANEOUS
  Administered 2019-12-13 (×2): 6 [IU] via SUBCUTANEOUS
  Administered 2019-12-13: 9 [IU] via SUBCUTANEOUS
  Administered 2019-12-13: 6 [IU] via SUBCUTANEOUS
  Administered 2019-12-14: 9 [IU] via SUBCUTANEOUS
  Administered 2019-12-14: 6 [IU] via SUBCUTANEOUS
  Administered 2019-12-14 (×3): 9 [IU] via SUBCUTANEOUS

## 2019-12-06 MED ORDER — FENTANYL CITRATE (PF) 100 MCG/2ML IJ SOLN
25.0000 ug | INTRAMUSCULAR | Status: DC | PRN
  Filled 2019-12-06: qty 2

## 2019-12-06 MED ORDER — SODIUM CHLORIDE 0.9% FLUSH: 10.0000 mL | INTRAVENOUS | Status: DC | PRN

## 2019-12-06 MED ORDER — SIMVASTATIN 20 MG PO TABS: 20.0000 mg | ORAL_TABLET | Freq: Every day | ORAL | Status: DC

## 2019-12-06 MED ORDER — HEPARIN BOLUS VIA INFUSION
4000.0000 [IU] | Freq: Once | INTRAVENOUS | Status: AC
  Administered 2019-12-06: 4000 [IU] via INTRAVENOUS
  Filled 2019-12-06: qty 4000

## 2019-12-06 MED ORDER — CHLORHEXIDINE GLUCONATE CLOTH 2 % EX PADS
6.0000 | MEDICATED_PAD | Freq: Every day | CUTANEOUS | Status: DC
  Administered 2019-12-06 – 2019-12-16 (×11): 6 via TOPICAL

## 2019-12-06 MED ORDER — IOHEXOL 350 MG/ML SOLN
75.0000 mL | Freq: Once | INTRAVENOUS | Status: AC | PRN
  Administered 2019-12-06: 75 mL via INTRAVENOUS

## 2019-12-06 MED ORDER — ASPIRIN 81 MG PO CHEW
81.0000 mg | CHEWABLE_TABLET | Freq: Every day | ORAL | Status: DC
  Administered 2019-12-06 – 2019-12-10 (×4): 81 mg
  Filled 2019-12-06 (×5): qty 1

## 2019-12-06 MED ORDER — PROSOURCE TF PO LIQD
45.0000 mL | Freq: Two times a day (BID) | ORAL | Status: DC
  Administered 2019-12-06: 45 mL
  Filled 2019-12-06: qty 45

## 2019-12-06 MED ORDER — DOCUSATE SODIUM 50 MG/5ML PO LIQD
100.0000 mg | Freq: Two times a day (BID) | ORAL | Status: DC
  Administered 2019-12-06 (×2): 100 mg
  Filled 2019-12-06 (×3): qty 10

## 2019-12-06 MED ORDER — PROSOURCE TF PO LIQD: 45.0000 mL | Freq: Two times a day (BID) | ORAL | Status: DC

## 2019-12-06 MED ORDER — DEXMEDETOMIDINE HCL IN NACL 400 MCG/100ML IV SOLN
0.4000 ug/kg/h | INTRAVENOUS | Status: DC
  Administered 2019-12-06 (×2): 0.4 ug/kg/h via INTRAVENOUS
  Filled 2019-12-06 (×3): qty 100

## 2019-12-06 MED ORDER — SERTRALINE HCL 20 MG/ML PO CONC
50.0000 mg | Freq: Every day | ORAL | Status: DC
  Administered 2019-12-06 – 2019-12-10 (×4): 50 mg
  Filled 2019-12-06 (×6): qty 2.5

## 2019-12-06 MED ORDER — FENTANYL CITRATE (PF) 100 MCG/2ML IJ SOLN: 25.0000 ug | INTRAMUSCULAR | Status: DC | PRN

## 2019-12-06 MED ORDER — POTASSIUM CHLORIDE 20 MEQ/15ML (10%) PO SOLN: 40.0000 meq | Freq: Once | ORAL | Status: DC

## 2019-12-06 MED ORDER — INSULIN DETEMIR 100 UNIT/ML ~~LOC~~ SOLN
12.0000 [IU] | Freq: Two times a day (BID) | SUBCUTANEOUS | Status: DC
  Administered 2019-12-06 – 2019-12-14 (×17): 12 [IU] via SUBCUTANEOUS
  Filled 2019-12-06 (×19): qty 0.12

## 2019-12-06 MED ORDER — ROSUVASTATIN CALCIUM 20 MG PO TABS
20.0000 mg | ORAL_TABLET | Freq: Every day | ORAL | Status: DC
  Administered 2019-12-06 – 2019-12-10 (×4): 20 mg
  Filled 2019-12-06 (×5): qty 1

## 2019-12-06 MED ORDER — FUROSEMIDE 10 MG/ML IJ SOLN
40.0000 mg | Freq: Once | INTRAMUSCULAR | Status: AC
  Administered 2019-12-06: 40 mg via INTRAVENOUS
  Filled 2019-12-06: qty 4

## 2019-12-06 MED ORDER — VITAL AF 1.2 CAL PO LIQD
1000.0000 mL | ORAL | Status: DC
  Administered 2019-12-06: 1000 mL

## 2019-12-06 MED ORDER — HEPARIN (PORCINE) 25000 UT/250ML-% IV SOLN
1500.0000 [IU]/h | INTRAVENOUS | Status: DC
  Administered 2019-12-06: 900 [IU]/h via INTRAVENOUS
  Administered 2019-12-07: 1000 [IU]/h via INTRAVENOUS
  Administered 2019-12-08 – 2019-12-09 (×2): 1050 [IU]/h via INTRAVENOUS
  Administered 2019-12-10 – 2019-12-11 (×2): 1100 [IU]/h via INTRAVENOUS
  Administered 2019-12-12: 1400 [IU]/h via INTRAVENOUS
  Administered 2019-12-13: 1500 [IU]/h via INTRAVENOUS
  Filled 2019-12-06 (×8): qty 250

## 2019-12-06 MED ORDER — POLYETHYLENE GLYCOL 3350 17 G PO PACK
17.0000 g | PACK | Freq: Every day | ORAL | Status: DC
  Filled 2019-12-06 (×2): qty 1

## 2019-12-06 NOTE — Progress Notes (Signed)
Pt transported to CT and back to [place] room 26 without any complications.

## 2019-12-06 NOTE — Progress Notes (Signed)
ANTICOAGULATION CONSULT NOTE - Initial Consult  Pharmacy Consult for heparin Indication: chest pain/ACS  Allergies  Allergen Reactions  . Sitagliptin Other (See Comments)    Had pancreatitis while on Januvia     Patient Measurements: Height: 5' 2.01" (157.5 cm) Weight: 90.7 kg (199 lb 15.3 oz) IBW/kg (Calculated) : 50.12 Heparin Dosing Weight: 72.4 kg  Vital Signs: Temp: 98.8 F (37.1 C) (08/25 0900) Temp Source: Esophageal (08/25 0900) BP: 131/57 (08/25 0900) Pulse Rate: 100 (08/25 1113)  Labs: Recent Labs    11/21/2019 1418 11/23/2019 1418 12/02/2019 1422 11/17/2019 1455 11/21/2019 1530 12/03/2019 2014 12/11/2019 2044 12/03/2019 2231 12/06/19 0500 12/06/19 0500 12/06/19 0504 12/06/19 1208  HGB 15.6*   < > 14.9   < >  --    < >  --    < > 13.4   < > 13.3 12.2  HCT 46.0   < > 46.4*   < >  --    < >  --    < > 38.6  --  39.0 36.0  PLT  --   --  272  --   --   --   --   --  189  --   --   --   LABPROT  --   --  14.1  --   --   --   --   --   --   --   --   --   INR  --   --  1.1  --   --   --   --   --   --   --   --   --   CREATININE 0.80  --   --   --  0.87  --   --   --  0.96  --   --   --   TROPONINIHS  --   --   --   --  293*  --  8,932*  --   --   --   --   --    < > = values in this interval not displayed.    Estimated Creatinine Clearance: 59.5 mL/min (by C-G formula based on SCr of 0.96 mg/dL).   Medical History: Past Medical History:  Diagnosis Date  . Anemia   . Anxiety   . Arthritis   . Asthma   . Broken arm    left arm  . Bronchitis   . Bursitis of left hip   . CAD (coronary artery disease)   . Candidiasis, vagina   . Cardiac arrest ([place]) 06/20/2016   at [facility]; ? due to flash pulm edema vs undertreated chronic CHF  . Cerumen impaction   . CHF (congestive heart failure) ([place])    RECENT ADMIT TO [facility]   . Colon, diverticulosis   . Depression   . Diabetes mellitus    15 YRS AGO  . Fatigue   . Gastroenteritis   . Hot flashes, menopausal   .  Hyperlipidemia   . Knee pain, left   . Lumbar back pain   . Maxillary sinusitis    history  . Muscle tear    right gluteus  . Nausea   . Nicotine dependence   . OSA (obstructive sleep apnea) 10/16/2017  . Other B-complex deficiencies   . Otitis media, acute    left  . Peripheral neuropathy   . Spinal stenosis of lumbar region   . Tubulovillous adenoma of colon   . Unspecified essential  hypertension     Medications:  Scheduled:  . aspirin  81 mg Per Tube Daily  . chlorhexidine gluconate (MEDLINE KIT)  15 mL Mouth Rinse BID  . Chlorhexidine Gluconate Cloth  6 each Topical Daily  . docusate  100 mg Per Tube BID  . furosemide  40 mg Intravenous Once  . heparin  4,000 Units Intravenous Once  . insulin aspart  3-9 Units Subcutaneous Q4H  . insulin detemir  12 Units Subcutaneous Q12H  . mouth rinse  15 mL Mouth Rinse 10 times per day  . pantoprazole (PROTONIX) IV  40 mg Intravenous QHS  . [START ON 12/07/2019] polyethylene glycol  17 g Per Tube Daily  . rosuvastatin  20 mg Per Tube Daily  . sertraline  50 mg Per Tube Daily  . sodium chloride flush  10 mL Intracatheter Q12H    Assessment: 69 YO female presenting s/p PEA arrest. Patient called EMS with acute complaints of shortness of breath and was found unresponsive in PEA arrest.  EMS provided 3 rounds of CPR and obtained ROSC prior to ED arrival.  Patient does have a history of PEA arrest in 2018.  No anticoagulation PTA.  Pharmacy was consulted to start heparin to rule out ACS.  8/25: trop's mildly elevated, CBC stable, Scr at baseline of ~0.8.   Goal of Therapy:  Heparin level 0.3-0.7 units/ml Monitor platelets by anticoagulation protocol: Yes   Plan:  Give 4000 units bolus x 1  Start heparin drip at 900 units/hr Check 6 hour HL Monitor daily CBC, signs/symptoms of bleeding  Dimple Nanas, PharmD PGY-1 Acute Care Pharmacy Resident Office: [phone] 12/06/2019 12:47 PM

## 2019-12-06 NOTE — Progress Notes (Signed)
[place] for heparin Indication: chest pain/ACS  Allergies  Allergen Reactions  . Sitagliptin Other (See Comments)    Had pancreatitis while on Januvia     Patient Measurements: Height: 5' 2.01" (157.5 cm) Weight: 90.7 kg (199 lb 15.3 oz) IBW/kg (Calculated) : 50.12 Heparin Dosing Weight: 72.4 kg  Vital Signs: Temp: 99.7 F (37.6 C) (08/25 2200) Temp Source: Esophageal (08/25 2000) BP: 112/56 (08/25 2200) Pulse Rate: 84 (08/25 2200)  Labs: Recent Labs    11/19/2019 1422 11/14/2019 1455 11/26/2019 1530 11/15/2019 2014 11/15/2019 2044 12/07/2019 2231 12/06/19 0500 12/06/19 0500 12/06/19 0504 12/06/19 0504 12/06/19 1208 12/06/19 1506 12/06/19 1605 12/06/19 2206  HGB 14.9   < >  --    < >  --    < > 13.4   < > 13.3   < > 12.2 11.9*  --   --   HCT 46.4*   < >  --    < >  --    < > 38.6   < > 39.0  --  36.0 35.0*  --   --   PLT 272  --   --   --   --   --  189  --   --   --   --   --   --   --   LABPROT 14.1  --   --   --   --   --   --   --   --   --   --   --   --   --   INR 1.1  --   --   --   --   --   --   --   --   --   --   --   --   --   HEPARINUNFRC  --   --   --   --   --   --   --   --   --   --   --   --   --  0.33  CREATININE  --    < > 0.87  --   --   --  0.96  --   --   --   --   --  0.95  --   TROPONINIHS  --   --  293*  --  8,932*  --   --   --   --   --   --   --  6,658*  --    < > = values in this interval not displayed.    Estimated Creatinine Clearance: 60.1 mL/min (by C-G formula based on SCr of 0.95 mg/dL).  Assessment: 69 YO female presenting s/p PEA arrest. Patient called EMS with acute complaints of shortness of breath and was found unresponsive in PEA arrest.  EMS provided 3 rounds of CPR and obtained ROSC prior to ED arrival.  Patient does have a history of PEA arrest in 2018.  No anticoagulation PTA.  Pharmacy was consulted to start heparin to rule out ACS.  Heparin level 0.33 (therapeutic) on gtt at 900  units/hr. No bleeding noted.  Goal of Therapy:  Heparin level 0.3-0.7 units/ml Monitor platelets by anticoagulation protocol: Yes   Plan:  Continue heparin drip at 900 units/hr F/u a.m. heparin level  Sherlon Handing, PharmD, BCPS Please see amion for complete clinical pharmacist phone list 12/06/2019 11:26 PM

## 2019-12-06 NOTE — Progress Notes (Signed)
NAME:  [name] Bean, MRN:  [id number], DOB:  14-Mar-1951, LOS: 1 ADMISSION DATE:  11/25/2019, CONSULTATION DATE:  12/08/2019 REFERRING MD:  Ralene Bathe - EM , CHIEF COMPLAINT:  Respiratory arrest, hypoxemia   Brief History   69yo female presented to ED after PEA cardiac arrest likely due to respiratory failure.  History of present illness   [name] Bean is a 69 y.o. with PHX significant for HTN, HLD, DM, CHF, cardiac arrest, CAD, and anxiety who presented to ED s/p cardiac arrest. Per report patient called EMS with acute complaints of SOB and on arrival EMS found patient unresponsive in PEA arrest. EMS provided 3 rounds of CPR and obtained ROSC prior to ED arrival. No ACLS medications were given. PCCM called urgently to evaluate pt in ED due to hypoxemia.   CXR with bilateral ASD, concerning for possible ARDS. ABG 7.2/52/48 Labs acquired in ED are hemolyzed, resent but unavailable for review   Past Medical History   Diastolic heart failure HTN DM Prior PEA arrest CAD   Significant Hospital Events   8/24 BIB [facility]ms for CC SOB. On EMS arrival, pulseless and hypoxemic. ROSC achieved. PCCM consulted for hypoxemia shortly after intubation. CVC and Aline placed by PCCM in ED 8/25 [facility] w/ much improved aeration. Able to start weaning PEEP/FIO2 down to 10 from 14 and FIO2 down from 100% to 70%. Transitioning OFF insulin gtt. Transitioning from Fent/propofol to precedex.  Consults:    Procedures:  8/24 ETT> 8/24 CVC> 8/24 Aline>  Significant Diagnostic Tests:  8/24 CXR> Diffuse bilateral pulmonary opacities. ETT 2.5cm above carina.  8/24 CTA chest>>1. No CT evidence of pulmonary embolism.2. Small bilateral pleural effusions with findings of pulmonary edema or ARDS.3. Clusters of airspace consolidation primarily involving the left lower lobe as well as upper lobes may represent pneumonia. Clinical correlation recommended.4. Aortic Atherosclerosis (ICD10-I70.0). 8/24 CT H >>  negative  ECHO 8/24: Left Ventricle: Endocardial border definition is poor but systolic  function appears to be grossly normal. Cannot rule out mild septal  hypokinesis. Left ventricular ejection fraction, by estimation, is 55 to 60%. The left ventricle has normal function. The left ventricle has no regional wall motion abnormalities. Definity contrast agent was given IV to delineate the left ventricular endocardial borders. The left ventricular internal cavity size was normal in size. There is no left ventricular hypertrophy. Left ventricular diastolic parameters are indeterminate.  Micro Data:  8/24 SARS Cov2> neg 8/24 RVP >> 8/24 Tracheal aspirate >> 8/24 BCx>>   Antimicrobials:  Unasyn 8/24 >>>  Interim history/subjective:  Weaning pressors off pressors.   Objective   Blood pressure (Abnormal) 141/61, pulse 92, temperature 99.5 F (37.5 C), resp. rate (Abnormal) 32, height 5\' 2"  (1.575 m), weight 90.7 kg, SpO2 100 %. CVP:  [10 mmHg-18 mmHg] 10 mmHg  Vent Mode: PRVC FiO2 (%):  [100 %] (P) 100 % Set Rate:  [32 bmp] 32 bmp Vt Set:  [350 mL] 350 mL PEEP:  [14 cmH20-22 cmH20] 14 cmH20 Plateau Pressure:  [31 cmH20-43 cmH20] 31 cmH20   Intake/Output Summary (Last 24 hours) at 12/06/2019 0918 Last data filed at 12/06/2019 0700 Gross per 24 hour  Intake 2200.74 ml  Output 1525 ml  Net 675.74 ml   Filed Weights   12/02/2019 1600 12/09/2019 2000  Weight: 95.1 kg 90.7 kg    Examination:  General - chronically ill appearing 69 year old female sedated on vent  HENT NCAT no clear JVD. MMM; orally intubated Pulm decreased t/o. PEEP down  10/fio2 70. Current Pplat 25/driving pressure 15, sats 100% Card RRR abd not tender + bowel sounds Ext warm + pulses. Brisk CR. Trace edema Neuro opens eyes. Moves all ext. Not following commands as of yet  GU clear yellow   Resolved Hospital Problem list     Assessment & Plan:   S/p PEA cardiac arrest presumptively respiratory arrest. -> prior  PEA arrest in 2018, attributed to pulm edema  -f/u echo w/ no sig change in EF, but did have mild trop bump and has possible mild septal hypokinesis  Plan Cont tele Will need cards eval-> I think she will likely need ischemia work-up  Shock: circulatory ; suspect medication related +/- transient cardiogenic component; sepsis possible as meets SIRS criteria BUT infiltrated cleared rapidly. IF there is an infectious component I suspect aspiration a possible consequence but seems unlikely the causative factor Plan Cont to wean NE Cont tele  Keep euvolemic   Acute diastolic HF w/ Pulmonary edema +/- demand ischemia  -had graduated from Dr. B HF clinic, April 2021 -- EF 60%; still 55-60% w/ possible mild septal hypokinesis; RV fxn nml, RV size NML,  Hx HTN Plan Cont tele Daily BMP Assess for diuresis later this am once off NE Aspirin VT  Crestor via tube 20 mg daily Was on Demadex 20 mg 1-2 tabs a day as needed, Coreg 25 mg twice daily, Cozaar 100 mg daily Hydralazine 50 mg twice daily; will assess ability to add these back once off pressors   Acute respiratory failure with hypoxemia in the setting of diffuse pulmonary edema -Cannot exclude element of aspiration Portable chest x-ray personally reviewed showing marked improvement in bilateral airspace disease endotracheal tube in satisfactory position Given remarkable improvement more inclined to think pulmonary edema the primary factor Plan Weaning PEEP/FiO2 Goal plateau pressure less than 30, driving pressure goal less than 15 Follow-up tracheal aspirate Day #2 of 5 Unasyn, follow-up sputum and respiratory viral panel VAP bundle Change PAD protocol to RASS goal of 0 to -1 Reassess for diuresis later this morning Reassess for weaning later this morning Chest x-ray a.m.  Mild hypophosphatemia Plan Replace recheck  Lactic acidosis -likely in setting of cardiac arrest. Plan Suspect this has cleared/ will recheck  DM w/  hyperglycemia  Plan Transition to phase 3 cbg goal 140-180  Best practice:  Diet: EN  Pain/Anxiety/Delirium protocol (if indicated): Fent, prop VAP protocol (if indicated): Yes  DVT prophylaxis: SQ Heparin GI prophylaxis: PPI  Glucose control: SSI  Mobility: BR Code Status: Full  Family Communication: pending Disposition: ICU  My critical care time 35 minutes  Erick Colace ACNP-BC [place] Pager # [phone] OR # [phone] if no answer

## 2019-12-06 NOTE — Progress Notes (Signed)
Initial Nutrition Assessment  DOCUMENTATION CODES:   Not applicable  INTERVENTION:   Tube Feeding via OG:  Vital AF 1.2 at 50 ml/hr Pro-Source TF 45 mL BID Provides 112 g of protein,  1520 kcals, 972 mL of free water  TF regimen and propofol at current rate providing 1897 total kcal/day    NUTRITION DIAGNOSIS:   Inadequate oral intake related to acute illness as evidenced by NPO status.  GOAL:   Patient will meet greater than or equal to 90% of their needs  MONITOR:   Vent status, Labs, Weight trends, TF tolerance  REASON FOR ASSESSMENT:   Consult, Ventilator Enteral/tube feeding initiation and management  ASSESSMENT:   69 yo female admitted post PEA arrest requiring intubation, shock. PMH includes HTN, DM, CAD s/p PCI, CHF  Patient is currently intubated on ventilator support MV: 11.2 L/min Temp (24hrs), Avg:99.5 F (37.5 C), Min:97.8 F (36.6 C), Max:100 F (37.8 C)  Propofol: 14.3 ml/hr  OG tube in good position  Current weight 90.7 kg  Unable to obtain diet and weight history from patient at this time  Labs: CBGs 124-215, phosphorus 1.7 (L) Meds: colace, ss novolog, levemir, miralax, KCl  Diet Order:   Diet Order            Diet NPO time specified  Diet effective now                 EDUCATION NEEDS:   Not appropriate for education at this time  Skin:  Skin Assessment: Reviewed RN Assessment  Last BM:  8/24  Height:   Ht Readings from Last 1 Encounters:  12/06/19 5' 2.01" (1.575 m)    Weight:   Wt Readings from Last 1 Encounters:  11/19/2019 90.7 kg    BMI:  Body mass index is 36.56 kg/m.  Estimated Nutritional Needs:   Kcal:  1790 kcals  Protein:  109-136 g  Fluid:  >/= 1.8 L    Kerman Passey MS, RDN, LDN, CNSC Registered Dietitian III Clinical Nutrition RD Pager and On-Call Pager Number Located in [place]

## 2019-12-06 NOTE — Consult Note (Addendum)
The patient has been seen in conjunction with Vin Baghat, PAC. All aspects of care have been considered and discussed. The patient has been personally interviewed, examined, and all clinical data has been reviewed.   The patient was awake but still on ventilator.  Unable to follow commands.  Recurrent PEA cardiac arrest previously evaluated with normal stress MRI at [facility] in 2018.  Prior history of RCA and LAD stents 2002 and 2015 respectively.  Chronic diastolic heart failure, hypertension, and hyperlipidemia complete her problem set.  Echocardiogram performed this admission demonstrates continued normal systolic function.  New left bundle branch block this admission compared to prior tracings.  Though PEA is not an ischemic arrhythmia, significant delta hs-TI increasing by 30 fold raises concern for acute coronary syndrome versus demand ischemia.  Given background history of coronary artery disease with prior stents, coronary angiography will be performed with the patient is more stable relative to blood pressure, kidney function, and neuro status.  Cardiology Consultation:   Patient ID: Elizeth Weinrich MRN: [id number]; DOB: 1950/07/17  Admit date: 11/15/2019 Date of Consult: 12/06/2019  Primary Care Provider: Billie Ruddy, MD [facility] Cardiologist: new  CHF: Dr. Haroldine Laws   Patient Profile:   Brindle Leyba is a 69 y.o. female with a hx of cardiac arrest 2018, CAD S/P PCI Stent  to RCA 2002 and distal LAD 6948, chronic diastolic heart failure, HTN, Hyperlipidemia, spinal stenosis, tobacco use and diabetis who is being seen today for the evaluation of NSTEMI at the request of Dr. Carlis Abbott.   Admitted March [facility] to [facility] after cardiac arrest (PEA) thought to be from pulmonary edema. Ran out of lasix 2 weeks prior to admit. Initially intubated. Had strep pneumo in sputum. Ruled out for PE. No evidenced of ischemia. Diuresed with IV lasix transition to 60 mg  torsemide daily. Discharge weight 191 pounds. cMRI showed EF 65% no reversible ischemia.   She was been followed by Dr. Haroldine Laws and has been graduated from [place] [facility] per last note 07/31/2019.   History of Present Illness:   Ms. Modisette called 911 for shortness of breath yesterday and while on 911 call she became unresponsive.  Upon arrival to EMS, she was found in PEA arrest.  Received 3 rounds of CPR and obtained ROSC prior to ER arrival.  No ACLS medication was given.  Patient was admitted by critical care team given persistent hypoxemia.  Treated empirically with antibiotic.  CT angio of chest without evidence of pulmonary embolism with findings of pulmonary edema bilaterally.  Repeat chest x-ray today showed improving pneumonia..  Creatinine normal.  Covid negative. Hs-troponin [phone].  Echo yesterday showed: 1. Endocardial border definition is poor but systolic function appears to  be grossly normal. Cannot rule out mild septal hypokinesis. Left  ventricular ejection fraction, by estimation, is 55 to 60%. The left  ventricle has normal function. The left  ventricle has no regional wall motion abnormalities. Left ventricular  diastolic parameters are indeterminate.  2. Right ventricular systolic function is normal. The right ventricular  size is normal. There is mildly elevated pulmonary artery systolic  pressure.  3. The mitral valve is normal in structure. No evidence of mitral valve  regurgitation. No evidence of mitral stenosis.  4. The aortic valve is normal in structure. Aortic valve regurgitation is  not visualized. No aortic stenosis is present.  5. The inferior vena cava is normal in size with <50% respiratory  variability, suggesting right atrial pressure of 8 mmHg.  Past Medical History:  Diagnosis Date  . Anemia   . Anxiety   . Arthritis   . Asthma   . Broken arm    left arm  . Bronchitis   . Bursitis of left hip   . CAD (coronary artery disease)   .  Candidiasis, vagina   . Cardiac arrest ([place]) 06/20/2016   at [facility]; ? due to flash pulm edema vs undertreated chronic CHF  . Cerumen impaction   . CHF (congestive heart failure) ([place])    RECENT ADMIT TO [facility]   . Colon, diverticulosis   . Depression   . Diabetes mellitus    15 YRS AGO  . Fatigue   . Gastroenteritis   . Hot flashes, menopausal   . Hyperlipidemia   . Knee pain, left   . Lumbar back pain   . Maxillary sinusitis    history  . Muscle tear    right gluteus  . Nausea   . Nicotine dependence   . OSA (obstructive sleep apnea) 10/16/2017  . Other B-complex deficiencies   . Otitis media, acute    left  . Peripheral neuropathy   . Spinal stenosis of lumbar region   . Tubulovillous adenoma of colon   . Unspecified essential hypertension     Past Surgical History:  Procedure Laterality Date  . CARDIAC CATHETERIZATION  2015, 2009, 2006  . CERVICAL SPINE SURGERY  2003  . CORONARY ANGIOPLASTY WITH STENT PLACEMENT  2002, 2015  . DILATATION & CURETTAGE/HYSTEROSCOPY WITH MYOSURE N/A 10/03/2019   Procedure: DILATATION & CURETTAGE/HYSTEROSCOPY WITH MYOSURE;  Surgeon: [name], MD;  Location: [place];  Service: Gynecology;  Laterality: N/A;  request 7:30am OR time in [facility] Gynecology requests 30 minutes OR time  . EYE SURGERY     cataracts bilaterally  . POSTERIOR CERVICAL LAMINECTOMY N/A 10/23/2016   Procedure: POSTERIOR CERVICAL LAMINECTOMY MULTI LEVEL CERVICAL TWO- CERVICAL THREE, CERVICAL THREE- CERVICAL FOUR;  Surgeon: Ashok Pall, MD;  Location: [place];  Service: Neurosurgery;  Laterality: N/A;  POSTERIOR    Inpatient Medications: Scheduled Meds: . aspirin  81 mg Per Tube Daily  . chlorhexidine gluconate (MEDLINE KIT)  15 mL Mouth Rinse BID  . Chlorhexidine Gluconate Cloth  6 each Topical Daily  . docusate  100 mg Per Tube BID  . feeding supplement (PROSource TF)  45 mL Per Tube BID  . insulin aspart  3-9 Units Subcutaneous Q4H  .  insulin detemir  12 Units Subcutaneous Q12H  . mouth rinse  15 mL Mouth Rinse 10 times per day  . pantoprazole (PROTONIX) IV  40 mg Intravenous QHS  . [START ON 12/07/2019] polyethylene glycol  17 g Per Tube Daily  . potassium chloride  40 mEq Oral Once  . rosuvastatin  20 mg Per Tube Daily  . sertraline  50 mg Per Tube Daily  . sodium chloride flush  10 mL Intracatheter Q12H   Continuous Infusions: . sodium chloride Stopped (11/25/2019 2305)  . ampicillin-sulbactam (UNASYN) IV 3 g (12/06/19 1200)  . dexmedetomidine (PRECEDEX) IV infusion 0.4 mcg/kg/hr (12/06/19 1138)  . feeding supplement (VITAL AF 1.2 CAL)    . heparin 900 Units/hr (12/06/19 1300)  . norepinephrine (LEVOPHED) Adult infusion Stopped (12/06/19 0954)  . potassium PHOSPHATE IVPB (in mmol) 30 mmol (12/06/19 1145)   PRN Meds: sodium chloride, acetaminophen, docusate sodium, fentaNYL (SUBLIMAZE) injection, fentaNYL (SUBLIMAZE) injection, ondansetron (ZOFRAN) IV  Allergies:    Allergies  Allergen Reactions  . Sitagliptin Other (See Comments)  Had pancreatitis while on Januvia     Social History:   Social History   Socioeconomic History  . Marital status: Single    Spouse name: Not on file  . Number of children: Not on file  . Years of education: Not on file  . Highest education level: Not on file  Occupational History  . Occupation: SR DEV OFFICER    Employer: [employer]    Comment: [employer]  Tobacco Use  . Smoking status: Current Every Day Smoker    Packs/day: 0.25    Years: 46.00    Pack years: 11.50    Types: Cigarettes  . Smokeless tobacco: Never Used  . Tobacco comment: smokes two a day  Vaping Use  . Vaping Use: Never used  Substance and Sexual Activity  . Alcohol use: Yes    Comment: rarely  . Drug use: Yes    Types: Marijuana  . Sexual activity: Not Currently    Comment: 1st intercourse 69 yo-More than 5 partners  Other Topics Concern  . Not on file  Social History Narrative  . Not on  file   Social Determinants of Health   Financial Resource Strain:   . Difficulty of Paying Living Expenses: Not on file  Food Insecurity:   . Worried About Charity fundraiser in the Last Year: Not on file  . Ran Out of Food in the Last Year: Not on file  Transportation Needs:   . Lack of Transportation (Medical): Not on file  . Lack of Transportation (Non-Medical): Not on file  Physical Activity:   . Days of Exercise per Week: Not on file  . Minutes of Exercise per Session: Not on file  Stress:   . Feeling of Stress : Not on file  Social Connections:   . Frequency of Communication with Friends and Family: Not on file  . Frequency of Social Gatherings with Friends and Family: Not on file  . Attends Religious Services: Not on file  . Active Member of Clubs or Organizations: Not on file  . Attends Archivist Meetings: Not on file  . Marital Status: Not on file  Intimate Partner Violence:   . Fear of Current or Ex-Partner: Not on file  . Emotionally Abused: Not on file  . Physically Abused: Not on file  . Sexually Abused: Not on file    Family History:   Family History  Problem Relation Age of Onset  . Pneumonia Father        deceased  . Alcohol abuse Father   . Hypertension Mother   . Diabetes Mother      ROS:  Please see the history of present illness.  All other ROS reviewed and negative.     Physical Exam/Data:   Vitals:   12/06/19 0729 12/06/19 0800 12/06/19 0900 12/06/19 1113  BP:  (!) 141/61 (!) 131/57   Pulse: 94 92 96 100  Resp: (!) 32 (!) 32 (!) 32 (!) 32  Temp:  99.5 F (37.5 C) 98.8 F (37.1 C)   TempSrc:  Esophageal Esophageal   SpO2: 100% 100% 98% 99%  Weight:      Height:  5' 2.01" (1.575 m)      Intake/Output Summary (Last 24 hours) at 12/06/2019 1440 Last data filed at 12/06/2019 1200 Gross per 24 hour  Intake 2232.96 ml  Output 2475 ml  Net -242.04 ml   Last 3 Weights 12/10/2019 11/25/2019 10/03/2019  Weight (lbs) 199 lb 15.3 oz  209 lb 10.5 oz 196 lb 9.6 oz  Weight (kg) 90.7 kg 95.1 kg 89.177 kg     Body mass index is 36.56 kg/m.  General:  Ill appearing female intubated and sedated  HEENT: normal Lymph: no adenopathy Neck: no JVD Endocrine:  No thryomegaly Vascular: No carotid bruits; FA pulses 2+ bilaterally without bruits  Cardiac:  normal S1, S2; RRR; no murmur Lungs:  clear to auscultation bilaterally, no wheezing, rhonchi or rales  Abd: soft, nontender, no hepatomegaly  Ext: no edema Musculoskeletal:  No deformities, BUE and BLE strength normal and equal Skin: warm and dry  Neuro:  no focal abnormalities noted. Opens eye with voice. Intubated and sedated  Psych:  Intubated  EKG:  The EKG was personally reviewed and demonstrates:  Sinus tachycardia with LBBB Telemetry:  Telemetry was personally reviewed and demonstrates:  Sinus rhythm   Relevant CV Studies:   Echo 08/10/19 1. Right ventricle is not well visualized even on the Definity echo  contrast images.  2. There is akinesis of the basal inferoseptal and inferior walls.  Overall LVEF 55-60%. Prior reports states previous LVEF 60-65% and no  regional wall motion abnormalities.  3. Left ventricular ejection fraction, by estimation, is 55 to 60%. The  left ventricle has normal function. The left ventricle demonstrates  regional wall motion abnormalities (see scoring diagram/findings for  description). Left ventricular diastolic  parameters are consistent with Grade I diastolic dysfunction (impaired  relaxation). Elevated left atrial pressure.  4. Right ventricular systolic function was not well visualized. The right  ventricular size is normal.  5. The mitral valve is normal in structure. Trivial mitral valve  regurgitation. No evidence of mitral stenosis.  6. The aortic valve is normal in structure. Aortic valve regurgitation is  not visualized. No aortic stenosis is present.  7. The inferior vena cava is normal in size with greater  than 50%  respiratory variability, suggesting right atrial pressure of 3 mmHg.   Laboratory Data:  High Sensitivity Troponin:   Recent Labs  Lab 11/17/2019 1530 11/13/2019 2044  TROPONINIHS 293* 8,932*     Chemistry Recent Labs  Lab 11/20/2019 1418 11/28/2019 1455 11/22/2019 1530 12/06/2019 2014 12/06/19 0500 12/06/19 0504 12/06/19 1208  NA 135   < > 136   < > 139 143 144  K 4.6   < > 3.8   < > 3.6 3.6 3.3*  CL 103  --  107  --  110  --   --   CO2  --   --  19*  --  20*  --   --   GLUCOSE 513*  --  441*  --  174*  --   --   BUN 12  --  11  --  13  --   --   CREATININE 0.80  --  0.87  --  0.96  --   --   CALCIUM  --   --  7.5*  --  8.1*  --   --   GFRNONAA  --   --  >60  --  >60  --   --   GFRAA  --   --  >60  --  >60  --   --   ANIONGAP  --   --  10  --  9  --   --    < > = values in this interval not displayed.    Recent Labs  Lab 11/13/2019 1530  PROT 5.9*  ALBUMIN 2.8*  AST 38  ALT 21  ALKPHOS 106  BILITOT 0.5   Hematology Recent Labs  Lab 11/21/2019 1422 12/01/2019 1455 12/06/19 0500 12/06/19 0504 12/06/19 1208  WBC 8.7  --  18.7*  --   --   RBC 4.95  --  4.23  --   --   HGB 14.9   < > 13.4 13.3 12.2  HCT 46.4*   < > 38.6 39.0 36.0  MCV 93.7  --  91.3  --   --   MCH 30.1  --  31.7  --   --   MCHC 32.1  --  34.7  --   --   RDW 12.2  --  12.4  --   --   PLT 272  --  189  --   --    < > = values in this interval not displayed.   BNP Recent Labs  Lab 12/08/2019 1419  BNP 162.7*    Radiology/Studies:  CT HEAD WO CONTRAST  Result Date: 12/06/2019 CLINICAL DATA:  Altered mental status, cardiac arrest EXAM: CT HEAD WITHOUT CONTRAST TECHNIQUE: Contiguous axial images were obtained from the base of the skull through the vertex without intravenous contrast. COMPARISON:  06/20/2016 FINDINGS: Brain: Normal anatomic configuration. No abnormal intra or extra-axial mass lesion or fluid collection. No abnormal mass effect or midline shift. No evidence of acute intracranial  hemorrhage or infarct. Ventricular size is normal. Cerebellum unremarkable. Vascular: Unremarkable Skull: Intact Sinuses/Orbits: Small air-fluid levels are noted within the sphenoid sinus, nonspecific in the setting of intubation. Remaining paranasal sinuses are clear. Orbits are unremarkable. Other: Mastoid air cells and middle ear cavities are clear. IMPRESSION: 1. No acute intracranial abnormality. Electronically Signed   By: Fidela Salisbury MD   On: 12/06/2019 01:22   CT ANGIO CHEST PE W OR WO CONTRAST  Result Date: 12/06/2019 CLINICAL DATA:  69 year old female with concern for pulmonary embolism. Status post cardiac arrest. EXAM: CT ANGIOGRAPHY CHEST WITH CONTRAST TECHNIQUE: Multidetector CT imaging of the chest was performed using the standard protocol during bolus administration of intravenous contrast. Multiplanar CT image reconstructions and MIPs were obtained to evaluate the vascular anatomy. CONTRAST:  2m OMNIPAQUE IOHEXOL 350 MG/ML SOLN COMPARISON:  Chest radiograph dated 11/28/2019. FINDINGS: Cardiovascular: There is no cardiomegaly or pericardial effusion. Three-vessel coronary vascular calcification noted. There is mild atherosclerotic calcification of the thoracic aorta. No pulmonary artery embolus identified. Mediastinum/Nodes: Top-normal hilar lymph nodes measure 10 mm in short axis on the right. Subcarinal lymph node measures 13 mm in short axis. An enteric tube is noted within the esophagus extending into the stomach. No mediastinal fluid collection. Lungs/Pleura: There are small bilateral pleural effusions. There is diffuse interstitial and interlobular septal prominence with diffuse ground-glass airspace opacity throughout the lungs. Findings may represent edema or ARDS. Clusters of airspace consolidation primarily involving the left lower lobe as well as upper lobes may represent pneumonia. Clinical correlation recommended. There is no pneumothorax. The central airways are patent. An  endotracheal tube is noted with tip above the carina. Upper Abdomen: Indeterminate left adrenal thickening measure 17 mm in thickness. Musculoskeletal: There is degenerative changes of the spine. No acute osseous pathology. Review of the MIP images confirms the above findings. IMPRESSION: 1. No CT evidence of pulmonary embolism. 2. Small bilateral pleural effusions with findings of pulmonary edema or ARDS. 3. Clusters of airspace consolidation primarily involving the left lower lobe as well as upper lobes may represent pneumonia. Clinical correlation recommended. 4. Aortic Atherosclerosis (ICD10-I70.0). Electronically  Signed   By: Anner Crete M.D.   On: 12/06/2019 01:31   DG Chest Port 1 View  Result Date: 12/06/2019 CLINICAL DATA:  Hypoxemia. EXAM: PORTABLE CHEST 1 VIEW COMPARISON:  December 05, 2019. FINDINGS: Stable cardiomediastinal silhouette. Endotracheal and nasogastric tubes are unchanged in position. Left internal jugular catheter is unchanged in position. Decreased pulmonary opacities are noted suggesting improving pneumonia. No pneumothorax or pleural effusion is noted. Bony thorax is unremarkable. IMPRESSION: Stable support apparatus. Decreased pulmonary opacities are noted suggesting improving pneumonia. Electronically Signed   By: Marijo Conception M.D.   On: 12/06/2019 08:15   DG CHEST PORT 1 VIEW  Result Date: 12/03/2019 CLINICAL DATA:  Endotracheal tube EXAM: PORTABLE CHEST 1 VIEW COMPARISON:  Earlier same day FINDINGS: Endotracheal tube is approximately 2.5 cm above the carina. Enteric tube passes below the diaphragm with tip out of field of view. New left IJ central line tip overlies the SVC. Persistent diffuse bilateral pulmonary opacities without substantial change in lung aeration. No pleural effusion. No pneumothorax. Normal heart size. IMPRESSION: Lines and tubes as above. Persistent diffuse bilateral pulmonary opacities without substantial change. Electronically Signed   By: Macy Mis M.D.   On: 11/21/2019 16:26   DG Chest Port 1 View  Result Date: 11/28/2019 CLINICAL DATA:  Cardiac arrest. EXAM: PORTABLE CHEST 1 VIEW COMPARISON:  Chest x-ray dated June 02, 2004. FINDINGS: Endotracheal tube tip 1.6 cm above the carina. Enteric tube entering the stomach with the tip below the field of view. The heart size and mediastinal contours are within normal limits. Diffuse opacities throughout both lungs, denser on the right, with relative subpleural sparing. No pneumothorax or large pleural effusion. No acute osseous abnormality. IMPRESSION: 1. Appropriately positioned endotracheal tube. 2. Diffuse opacities throughout both lungs, suspicious for capillary leak pulmonary edema such as from prolonged hypotension given clinical history. Diffuse alveolar hemorrhage or ARDS could have a similar appearance. Electronically Signed   By: Titus Dubin M.D.   On: 11/27/2019 14:43   ECHOCARDIOGRAM COMPLETE  Result Date: 11/27/2019    ECHOCARDIOGRAM REPORT   Patient Name:   TATJANA TURCOTT Lama Date of Exam: 11/12/2019 Medical Rec #:  [id number]                Height:       62.0 in Accession #:    [id number]               Weight:       209.7 lb Date of Birth:  08/29/1950                 BSA:          1.950 m Patient Age:    36 years                 BP:           126/52 mmHg Patient Gender: F                        HR:           128 bpm. Exam Location:  Inpatient Procedure: 2D Echo and Intracardiac Opacification Agent STAT ECHO Indications:    Cardiac arrest I46.9  History:        Patient has no prior history of Echocardiogram examinations.                 CHF, CAD; Risk Factors:Dyslipidemia. Anemia.  Sonographer:    Jonelle Sidle  Burt Knack RDCS Referring Phys: [id number] GRACE E BOWSER  Sonographer Comments: Technically difficult study due to poor echo windows and echo performed with patient supine and on artificial respirator. IMPRESSIONS  1. Endocardial border definition is poor but systolic function  appears to be grossly normal. Cannot rule out mild septal hypokinesis. Left ventricular ejection fraction, by estimation, is 55 to 60%. The left ventricle has normal function. The left ventricle has no regional wall motion abnormalities. Left ventricular diastolic parameters are indeterminate.  2. Right ventricular systolic function is normal. The right ventricular size is normal. There is mildly elevated pulmonary artery systolic pressure.  3. The mitral valve is normal in structure. No evidence of mitral valve regurgitation. No evidence of mitral stenosis.  4. The aortic valve is normal in structure. Aortic valve regurgitation is not visualized. No aortic stenosis is present.  5. The inferior vena cava is normal in size with <50% respiratory variability, suggesting right atrial pressure of 8 mmHg. FINDINGS  Left Ventricle: Endocardial border definition is poor but systolic function appears to be grossly normal. Cannot rule out mild septal hypokinesis. Left ventricular ejection fraction, by estimation, is 55 to 60%. The left ventricle has normal function. The left ventricle has no regional wall motion abnormalities. Definity contrast agent was given IV to delineate the left ventricular endocardial borders. The left ventricular internal cavity size was normal in size. There is no left ventricular hypertrophy. Left ventricular diastolic parameters are indeterminate. Right Ventricle: The right ventricular size is normal. No increase in right ventricular wall thickness. Right ventricular systolic function is normal. There is mildly elevated pulmonary artery systolic pressure. The tricuspid regurgitant velocity is 2.76  m/s, and with an assumed right atrial pressure of 8 mmHg, the estimated right ventricular systolic pressure is 22.0 mmHg. Left Atrium: Left atrial size was normal in size. Right Atrium: Right atrial size was normal in size. Pericardium: There is no evidence of pericardial effusion. Mitral Valve: The  mitral valve is normal in structure. Moderately decreased mobility of the mitral valve leaflets. Moderate mitral annular calcification. No evidence of mitral valve regurgitation. No evidence of mitral valve stenosis. Tricuspid Valve: The tricuspid valve is normal in structure. Tricuspid valve regurgitation is mild . No evidence of tricuspid stenosis. Aortic Valve: The aortic valve is normal in structure.. There is mild thickening and mild calcification of the aortic valve. Aortic valve regurgitation is not visualized. No aortic stenosis is present. There is mild thickening of the aortic valve. There is mild calcification of the aortic valve. Pulmonic Valve: The pulmonic valve was normal in structure. Pulmonic valve regurgitation is not visualized. No evidence of pulmonic stenosis. Aorta: The aortic root is normal in size and structure. Venous: The inferior vena cava is normal in size with less than 50% respiratory variability, suggesting right atrial pressure of 8 mmHg. IAS/Shunts: No atrial level shunt detected by color flow Doppler.  LEFT VENTRICLE PLAX 2D LVOT diam:     1.60 cm LV SV:         18 LV SV Index:   9 LVOT Area:     2.01 cm  AORTIC VALVE LVOT Vmax:   62.70 cm/s LVOT Vmean:  41.300 cm/s LVOT VTI:    0.087 m  AORTA Ao Root diam: 2.40 cm TRICUSPID VALVE TR Peak grad:   30.5 mmHg TR Vmax:        276.00 cm/s  SHUNTS Systemic VTI:  0.09 m Systemic Diam: 1.60 cm Skeet Latch MD Electronically signed by Skeet Latch MD  Signature Date/Time: 11/30/2019/6:19:04 PM    Final     TIMI Risk Score for Unstable Angina or Non-ST Elevation MI:   The patient's TIMI risk score is 6, which indicates a 41% risk of all cause mortality, new or recurrent myocardial infarction or need for urgent revascularization in the next 14 days.    Assessment and Plan:   1. S/p PEA cardiac arest - Likely 2nd to respiratory failure. Similar arrest in 2018 due to pulmonary edema.  - See below  2. NSTEMI - Hs-Troponin  [phone]. EKG yesterday showed tachycardia with LBBB. Will repeat EKG.  - Continue heparin - Will Plan cardiac catheterization after neurological recovery  3. CAD S/P PCI Stent  o RCA 2002 and distal LAD 2015 - Stress cMRI [facility] 3/18 with EF 60-65% no reversible ischemia - Echo yesterday showed LVEF of 55-60%, no regional WM abnormality. Cannot rule out mild septal hypokinesis. - Continue ASA and statin   4. HLD - Continue statin   5. Chronic diastolic CHF - No significant volume overloaded  - LVEF 55-60% - Continue lasix  6. Acute hypoxic respiratory failure - Per PCCM   Dr. Tamala Julian to see later today.   For questions or updates, please contact [place] Please consult [email or web address] for contact info under    Jarrett Soho, PA  12/06/2019 2:40 PM

## 2019-12-07 ENCOUNTER — Inpatient Hospital Stay (HOSPITAL_COMMUNITY): Payer: Medicare PPO

## 2019-12-07 ENCOUNTER — Ambulatory Visit: Payer: Medicare PPO | Admitting: Physical Therapy

## 2019-12-07 DIAGNOSIS — I2510 Atherosclerotic heart disease of native coronary artery without angina pectoris: Secondary | ICD-10-CM

## 2019-12-07 DIAGNOSIS — J810 Acute pulmonary edema: Secondary | ICD-10-CM

## 2019-12-07 DIAGNOSIS — I5031 Acute diastolic (congestive) heart failure: Secondary | ICD-10-CM

## 2019-12-07 DIAGNOSIS — J9600 Acute respiratory failure, unspecified whether with hypoxia or hypercapnia: Secondary | ICD-10-CM

## 2019-12-07 LAB — COMPREHENSIVE METABOLIC PANEL
ALT: 24 U/L (ref 0–44)
ALT: 214 U/L — ABNORMAL HIGH (ref 0–44)
AST: 56 U/L — ABNORMAL HIGH (ref 15–41)
AST: 275 U/L — ABNORMAL HIGH (ref 15–41)
Albumin: 2.7 g/dL — ABNORMAL LOW (ref 3.5–5.0)
Albumin: 2.7 g/dL — ABNORMAL LOW (ref 3.5–5.0)
Alkaline Phosphatase: 79 U/L (ref 38–126)
Alkaline Phosphatase: 121 U/L (ref 38–126)
Anion gap: 10 (ref 5–15)
Anion gap: 17 — ABNORMAL HIGH (ref 5–15)
BUN: 13 mg/dL (ref 8–23)
BUN: 15 mg/dL (ref 8–23)
CO2: 22 mmol/L (ref 22–32)
CO2: 20 mmol/L — ABNORMAL LOW (ref 22–32)
Calcium: 8 mg/dL — ABNORMAL LOW (ref 8.9–10.3)
Calcium: 8.2 mg/dL — ABNORMAL LOW (ref 8.9–10.3)
Chloride: 108 mmol/L (ref 98–111)
Chloride: 105 mmol/L (ref 98–111)
Creatinine, Ser: 0.9 mg/dL (ref 0.44–1.00)
Creatinine, Ser: 1.2 mg/dL — ABNORMAL HIGH (ref 0.44–1.00)
GFR calc Af Amer: >60 mL/min (ref >60)
GFR calc Af Amer: 54 mL/min — ABNORMAL LOW (ref >60)
GFR calc non Af Amer: >60 mL/min (ref >60)
GFR calc non Af Amer: 47 mL/min — ABNORMAL LOW (ref >60)
Glucose, Bld: 153 mg/dL — ABNORMAL HIGH (ref 70–99)
Glucose, Bld: 320 mg/dL — ABNORMAL HIGH (ref 70–99)
Potassium: 3.7 mmol/L (ref 3.5–5.1)
Potassium: 3.7 mmol/L (ref 3.5–5.1)
Sodium: 140 mmol/L (ref 135–145)
Sodium: 142 mmol/L (ref 135–145)
Total Bilirubin: 1 mg/dL (ref 0.3–1.2)
Total Bilirubin: 0.8 mg/dL (ref 0.3–1.2)
Total Protein: 5.9 g/dL — ABNORMAL LOW (ref 6.5–8.1)
Total Protein: 6.2 g/dL — ABNORMAL LOW (ref 6.5–8.1)

## 2019-12-07 LAB — POCT I-STAT 7, (LYTES, BLD GAS, ICA,H+H)
Acid-Base Excess: 0 mmol/L (ref 0.0–2.0)
Acid-Base Excess: 1 mmol/L (ref 0.0–2.0)
Acid-base deficit: 1 mmol/L (ref 0.0–2.0)
Bicarbonate: 23.5 mmol/L (ref 20.0–28.0)
Bicarbonate: 24.7 mmol/L (ref 20.0–28.0)
Bicarbonate: 26.2 mmol/L (ref 20.0–28.0)
Calcium, Ion: 1.13 mmol/L — ABNORMAL LOW (ref 1.15–1.40)
Calcium, Ion: 1.12 mmol/L — ABNORMAL LOW (ref 1.15–1.40)
Calcium, Ion: 1.09 mmol/L — ABNORMAL LOW (ref 1.15–1.40)
HCT: 30 % — ABNORMAL LOW (ref 36.0–46.0)
HCT: 33 % — ABNORMAL LOW (ref 36.0–46.0)
HCT: 33 % — ABNORMAL LOW (ref 36.0–46.0)
Hemoglobin: 10.2 g/dL — ABNORMAL LOW (ref 12.0–15.0)
Hemoglobin: 11.2 g/dL — ABNORMAL LOW (ref 12.0–15.0)
Hemoglobin: 11.2 g/dL — ABNORMAL LOW (ref 12.0–15.0)
O2 Saturation: 99 %
O2 Saturation: 98 %
O2 Saturation: 98 %
Patient temperature: 37.5
Patient temperature: 98.8
Patient temperature: 98.8
Potassium: 3.7 mmol/L (ref 3.5–5.1)
Potassium: 3.6 mmol/L (ref 3.5–5.1)
Potassium: 3.8 mmol/L (ref 3.5–5.1)
Sodium: 142 mmol/L (ref 135–145)
Sodium: 141 mmol/L (ref 135–145)
Sodium: 142 mmol/L (ref 135–145)
TCO2: 25 mmol/L (ref 22–32)
TCO2: 26 mmol/L (ref 22–32)
TCO2: 28 mmol/L (ref 22–32)
pCO2 arterial: 36.1 mmHg (ref 32.0–48.0)
pCO2 arterial: 45.5 mmHg (ref 32.0–48.0)
pCO2 arterial: 44.9 mmHg (ref 32.0–48.0)
pH, Arterial: 7.424 (ref 7.350–7.450)
pH, Arterial: 7.344 — ABNORMAL LOW (ref 7.350–7.450)
pH, Arterial: 7.376 (ref 7.350–7.450)
pO2, Arterial: 127 mmHg — ABNORMAL HIGH (ref 83.0–108.0)
pO2, Arterial: 115 mmHg — ABNORMAL HIGH (ref 83.0–108.0)
pO2, Arterial: 105 mmHg (ref 83.0–108.0)

## 2019-12-07 LAB — CBC
HCT: 33.2 % — ABNORMAL LOW (ref 36.0–46.0)
Hemoglobin: 11.2 g/dL — ABNORMAL LOW (ref 12.0–15.0)
MCH: 30.4 pg (ref 26.0–34.0)
MCHC: 33.7 g/dL (ref 30.0–36.0)
MCV: 90.2 fL (ref 80.0–100.0)
Platelets: 157 10*3/uL (ref 150–400)
RBC: 3.68 MIL/uL — ABNORMAL LOW (ref 3.87–5.11)
RDW: 12.7 % (ref 11.5–15.5)
WBC: 11.6 10*3/uL — ABNORMAL HIGH (ref 4.0–10.5)
nRBC: 0 % (ref 0.0–0.2)

## 2019-12-07 LAB — GLUCOSE, CAPILLARY
Glucose-Capillary: 139 mg/dL — ABNORMAL HIGH (ref 70–99)
Glucose-Capillary: 140 mg/dL — ABNORMAL HIGH (ref 70–99)
Glucose-Capillary: 154 mg/dL — ABNORMAL HIGH (ref 70–99)
Glucose-Capillary: 203 mg/dL — ABNORMAL HIGH (ref 70–99)
Glucose-Capillary: 206 mg/dL — ABNORMAL HIGH (ref 70–99)
Glucose-Capillary: 223 mg/dL — ABNORMAL HIGH (ref 70–99)
Glucose-Capillary: 224 mg/dL — ABNORMAL HIGH (ref 70–99)
Glucose-Capillary: 287 mg/dL — ABNORMAL HIGH (ref 70–99)

## 2019-12-07 LAB — CBC WITH DIFFERENTIAL/PLATELET
Abs Immature Granulocytes: 0.26 10*3/uL — ABNORMAL HIGH (ref 0.00–0.07)
Basophils Absolute: 0.1 10*3/uL (ref 0.0–0.1)
Basophils Relative: 1 %
Eosinophils Absolute: 0.1 10*3/uL (ref 0.0–0.5)
Eosinophils Relative: 1 %
HCT: 39.1 % (ref 36.0–46.0)
Hemoglobin: 12.8 g/dL (ref 12.0–15.0)
Immature Granulocytes: 2 %
Lymphocytes Relative: 26 %
Lymphs Abs: 3.4 10*3/uL (ref 0.7–4.0)
MCH: 31.6 pg (ref 26.0–34.0)
MCHC: 32.7 g/dL (ref 30.0–36.0)
MCV: 96.5 fL (ref 80.0–100.0)
Monocytes Absolute: 0.3 10*3/uL (ref 0.1–1.0)
Monocytes Relative: 2 %
Neutro Abs: 9 10*3/uL — ABNORMAL HIGH (ref 1.7–7.7)
Neutrophils Relative %: 68 %
Platelets: UNDETERMINED 10*3/uL (ref 150–400)
RBC: 4.05 MIL/uL (ref 3.87–5.11)
RDW: 12.9 % (ref 11.5–15.5)
WBC Morphology: INCREASED
WBC: 13.1 10*3/uL — ABNORMAL HIGH (ref 4.0–10.5)
nRBC: 0.2 % (ref 0.0–0.2)

## 2019-12-07 LAB — BRAIN NATRIURETIC PEPTIDE
B Natriuretic Peptide: 192.2 pg/mL — ABNORMAL HIGH (ref 0.0–100.0)
B Natriuretic Peptide: 209.1 pg/mL — ABNORMAL HIGH (ref 0.0–100.0)

## 2019-12-07 LAB — HEPARIN LEVEL (UNFRACTIONATED)
Heparin Unfractionated: <0.1 IU/mL — ABNORMAL LOW (ref 0.30–0.70)
Heparin Unfractionated: <0.1 IU/mL — ABNORMAL LOW (ref 0.30–0.70)
Heparin Unfractionated: <0.1 IU/mL — ABNORMAL LOW (ref 0.30–0.70)
Heparin Unfractionated: 0.31 IU/mL (ref 0.30–0.70)

## 2019-12-07 LAB — MAGNESIUM
Magnesium: 2.1 mg/dL (ref 1.7–2.4)
Magnesium: 2.4 mg/dL (ref 1.7–2.4)
Magnesium: 1.8 mg/dL (ref 1.7–2.4)

## 2019-12-07 LAB — PHOSPHORUS
Phosphorus: 3.2 mg/dL (ref 2.5–4.6)
Phosphorus: 4.4 mg/dL (ref 2.5–4.6)

## 2019-12-07 LAB — PROTIME-INR
INR: 1.4 — ABNORMAL HIGH (ref 0.8–1.2)
Prothrombin Time: 17 seconds — ABNORMAL HIGH (ref 11.4–15.2)

## 2019-12-07 LAB — APTT: aPTT: 51 seconds — ABNORMAL HIGH (ref 24–36)

## 2019-12-07 LAB — TRIGLYCERIDES: Triglycerides: 157 mg/dL — ABNORMAL HIGH (ref <150)

## 2019-12-07 LAB — TROPONIN I (HIGH SENSITIVITY): Troponin I (High Sensitivity): 2702 ng/L (ref <18)

## 2019-12-07 MED ORDER — ALBUTEROL SULFATE (2.5 MG/3ML) 0.083% IN NEBU
INHALATION_SOLUTION | RESPIRATORY_TRACT | Status: AC
  Filled 2019-12-07: qty 3

## 2019-12-07 MED ORDER — DOCUSATE SODIUM 50 MG/5ML PO LIQD: 100.0000 mg | Freq: Two times a day (BID) | ORAL | Status: DC | PRN

## 2019-12-07 MED ORDER — POTASSIUM CHLORIDE 10 MEQ/50ML IV SOLN
10.0000 meq | INTRAVENOUS | Status: AC
  Administered 2019-12-07 (×2): 10 meq via INTRAVENOUS
  Filled 2019-12-07 (×2): qty 50

## 2019-12-07 MED ORDER — POTASSIUM CHLORIDE 20 MEQ/15ML (10%) PO SOLN
40.0000 meq | Freq: Once | ORAL | Status: AC
  Administered 2019-12-07: 40 meq
  Filled 2019-12-07: qty 30

## 2019-12-07 MED ORDER — DOCUSATE SODIUM 50 MG/5ML PO LIQD
100.0000 mg | Freq: Two times a day (BID) | ORAL | Status: DC | PRN
  Filled 2019-12-07: qty 10

## 2019-12-07 MED ORDER — FUROSEMIDE 10 MG/ML IJ SOLN: 80.0000 mg | Freq: Four times a day (QID) | INTRAMUSCULAR | Status: DC

## 2019-12-07 MED ORDER — DEXMEDETOMIDINE HCL IN NACL 400 MCG/100ML IV SOLN
0.0000 ug/kg/h | INTRAVENOUS | Status: DC
  Administered 2019-12-07: 0.8 ug/kg/h via INTRAVENOUS
  Administered 2019-12-07: 0.4 ug/kg/h via INTRAVENOUS
  Administered 2019-12-07: 0.8 ug/kg/h via INTRAVENOUS
  Administered 2019-12-08 (×2): 1 ug/kg/h via INTRAVENOUS
  Administered 2019-12-08: 0.846 ug/kg/h via INTRAVENOUS
  Administered 2019-12-08: 1.2 ug/kg/h via INTRAVENOUS
  Administered 2019-12-09: 0.4 ug/kg/h via INTRAVENOUS
  Administered 2019-12-09: 1 ug/kg/h via INTRAVENOUS
  Administered 2019-12-10: 0.4 ug/kg/h via INTRAVENOUS
  Administered 2019-12-10: 0.2 ug/kg/h via INTRAVENOUS
  Filled 2019-12-07 (×3): qty 100
  Filled 2019-12-07: qty 200
  Filled 2019-12-07 (×6): qty 100

## 2019-12-07 MED ORDER — FUROSEMIDE 10 MG/ML IJ SOLN
80.0000 mg | Freq: Four times a day (QID) | INTRAMUSCULAR | Status: AC
  Administered 2019-12-08 (×2): 80 mg via INTRAVENOUS
  Filled 2019-12-07 (×2): qty 8

## 2019-12-07 MED ORDER — FENTANYL 2500MCG IN NS 250ML (10MCG/ML) PREMIX INFUSION
25.0000 ug/h | INTRAVENOUS | Status: DC
  Administered 2019-12-07: 50 ug/h via INTRAVENOUS
  Administered 2019-12-09 (×2): 150 ug/h via INTRAVENOUS
  Filled 2019-12-07 (×5): qty 250

## 2019-12-07 MED ORDER — MORPHINE SULFATE (PF) 2 MG/ML IV SOLN
INTRAVENOUS | Status: AC
  Filled 2019-12-07: qty 1

## 2019-12-07 MED ORDER — FENTANYL CITRATE (PF) 100 MCG/2ML IJ SOLN
25.0000 ug | Freq: Once | INTRAMUSCULAR | Status: DC
  Filled 2019-12-07: qty 2

## 2019-12-07 MED ORDER — FENTANYL CITRATE (PF) 100 MCG/2ML IJ SOLN
100.0000 ug | INTRAMUSCULAR | Status: AC
  Administered 2019-12-07: 100 ug via INTRAVENOUS
  Filled 2019-12-07: qty 2

## 2019-12-07 MED ORDER — DOCUSATE SODIUM 50 MG/5ML PO LIQD
100.0000 mg | Freq: Two times a day (BID) | ORAL | Status: DC
  Administered 2019-12-07 – 2019-12-09 (×4): 100 mg
  Filled 2019-12-07 (×4): qty 10

## 2019-12-07 MED ORDER — POLYETHYLENE GLYCOL 3350 17 G PO PACK
17.0000 g | PACK | Freq: Every day | ORAL | Status: DC
  Administered 2019-12-08 – 2019-12-09 (×2): 17 g
  Filled 2019-12-07 (×2): qty 1

## 2019-12-07 MED ORDER — FENTANYL BOLUS VIA INFUSION
25.0000 ug | INTRAVENOUS | Status: DC | PRN
  Filled 2019-12-07: qty 25

## 2019-12-07 MED ORDER — POTASSIUM CHLORIDE 20 MEQ/15ML (10%) PO SOLN: 40.0000 meq | Freq: Once | ORAL | Status: DC

## 2019-12-07 MED ORDER — ACETAMINOPHEN 160 MG/5ML PO SOLN
650.0000 mg | ORAL | Status: DC | PRN
  Administered 2019-12-07 – 2019-12-08 (×2): 650 mg
  Filled 2019-12-07 (×2): qty 20.3

## 2019-12-07 MED ORDER — PROSOURCE TF PO LIQD
45.0000 mL | Freq: Two times a day (BID) | ORAL | Status: DC
  Administered 2019-12-07 – 2019-12-10 (×7): 45 mL
  Filled 2019-12-07 (×5): qty 45

## 2019-12-07 MED ORDER — VITAL AF 1.2 CAL PO LIQD
1000.0000 mL | ORAL | Status: DC
  Administered 2019-12-07 – 2019-12-10 (×3): 1000 mL

## 2019-12-07 MED ORDER — LABETALOL HCL 5 MG/ML IV SOLN
0.5000 mg/min | Status: DC
  Filled 2019-12-07: qty 80

## 2019-12-07 MED ORDER — FUROSEMIDE 10 MG/ML IJ SOLN
80.0000 mg | Freq: Two times a day (BID) | INTRAMUSCULAR | Status: DC
  Administered 2019-12-07 (×2): 80 mg via INTRAVENOUS
  Filled 2019-12-07 (×2): qty 8

## 2019-12-07 MED ORDER — MAGNESIUM SULFATE 2 GM/50ML IV SOLN
2.0000 g | Freq: Once | INTRAVENOUS | Status: AC
  Administered 2019-12-07: 2 g via INTRAVENOUS
  Filled 2019-12-07: qty 50

## 2019-12-07 MED ORDER — MORPHINE SULFATE (PF) 2 MG/ML IV SOLN
2.0000 mg | Freq: Once | INTRAVENOUS | Status: AC
  Filled 2019-12-07: qty 1

## 2019-12-07 MED ORDER — LABETALOL HCL 5 MG/ML IV SOLN
20.0000 mg | INTRAVENOUS | Status: AC | PRN
  Administered 2019-12-07 – 2019-12-14 (×10): 20 mg via INTRAVENOUS
  Filled 2019-12-07 (×8): qty 4

## 2019-12-07 MED ORDER — VITAL AF 1.2 CAL PO LIQD: 1000.0000 mL | ORAL | Status: DC

## 2019-12-07 MED ORDER — CALCIUM GLUCONATE-NACL 2-0.675 GM/100ML-% IV SOLN
2.0000 g | Freq: Once | INTRAVENOUS | Status: AC
  Administered 2019-12-07: 2000 mg via INTRAVENOUS
  Filled 2019-12-07: qty 100

## 2019-12-07 MED ORDER — NOREPINEPHRINE 16 MG/250ML-% IV SOLN
0.0000 ug/min | INTRAVENOUS | Status: DC
  Administered 2019-12-07: 4 ug/min via INTRAVENOUS

## 2019-12-07 NOTE — Procedures (Signed)
Intubation Procedure Note  [name] Bean  [id number]  Jul 23, 1950  Date:12/07/19  Time:10:10 AM   Provider Performing:[name] Bean    Procedure: Intubation ([id number])  Indication(s) Respiratory Failure  Consent Unable to obtain consent due to emergent nature of procedure.   Anesthesia none   Time Out Verified patient identification, verified procedure, site/side was marked, verified correct patient position, special equipment/implants available, medications/allergies/relevant history reviewed, required imaging and test results available.   Sterile Technique Usual hand hygeine, masks, and gloves were used   Procedure Description Patient positioned in bed supine.  Sedation given as noted above.  Patient was intubated with endotracheal tube using Glidescope.  View was Grade 3 only epiglottis .  Number of attempts was 1.  Colorimetric CO2 detector was consistent with tracheal placement.   Complications/Tolerance None; patient tolerated the procedure well. Chest X-ray is ordered to verify placement.   EBL Minimal   Specimen(s) None  Erick Colace ACNP-BC [place] Pager # [phone] OR # [phone] if no answer

## 2019-12-07 NOTE — Progress Notes (Addendum)
NAME:  [name] Bean, MRN:  [id number], DOB:  1950/06/19, LOS: 2 ADMISSION DATE:  11/26/2019, CONSULTATION DATE:  11/28/2019 REFERRING MD:  Ralene Bathe - EM , CHIEF COMPLAINT:  Respiratory arrest, hypoxemia   Brief History   69yo female presented to ED after PEA cardiac arrest likely due to respiratory failure.  History of present illness   [name] Bean is a 69 y.o. with PHX significant for HTN, HLD, DM, CHF, cardiac arrest, CAD, and anxiety who presented to ED s/p cardiac arrest. Per report patient called EMS with acute complaints of SOB and on arrival EMS found patient unresponsive in PEA arrest. EMS provided 3 rounds of CPR and obtained ROSC prior to ED arrival. No ACLS medications were given. PCCM called urgently to evaluate pt in ED due to hypoxemia.   CXR with bilateral ASD, concerning for possible ARDS. ABG 7.2/52/48 Labs acquired in ED are hemolyzed, resent but unavailable for review   Past Medical History   Diastolic heart failure HTN DM Prior PEA arrest CAD   Significant Hospital Events   8/24 BIB [facility] for CC SOB. On EMS arrival, pulseless and hypoxemic. ROSC achieved. PCCM consulted for hypoxemia shortly after intubation. CVC and Aline placed by PCCM in ED 8/25 [facility] w/ much improved aeration. Able to start weaning PEEP/FIO2 down to 10 from 14 and FIO2 down from 100% to 70%. Transitioning OFF insulin gtt. Transitioning from Fent/propofol to precedex.  8/26: Seen by cardiology on the 25th for non-ST elevation MI.  Started on IV heparin and aspirin on the 25th.  Past spontaneous breathing trial on the morning of 8/26 and was extubated.  Did have some fairly significant hypertension post extubation-->progressed to rapid resp failure and subsequent PEA arrest. Re intubated. Time to ROSC 9 minutes.  Consults:    Procedures:  8/24 ETT>8/26>>> 8/24 CVC> 8/24 Aline>  Significant Diagnostic Tests:  8/24 CXR> Diffuse bilateral pulmonary opacities. ETT 2.5cm above carina.   8/24 CTA chest>>1. No CT evidence of pulmonary embolism.2. Small bilateral pleural effusions with findings of pulmonary edema or ARDS.3. Clusters of airspace consolidation primarily involving the left lower lobe as well as upper lobes may represent pneumonia. Clinical correlation recommended.4. Aortic Atherosclerosis (ICD10-I70.0). 8/24 CT H >> negative  ECHO 8/24: Left Ventricle: Endocardial border definition is poor but systolic  function appears to be grossly normal. Cannot rule out mild septal  hypokinesis. Left ventricular ejection fraction, by estimation, is 55 to 60%. The left ventricle has normal function. The left ventricle has no regional wall motion abnormalities. Definity contrast agent was given IV to delineate the left ventricular endocardial borders. The left ventricular internal cavity size was normal in size. There is no left ventricular hypertrophy. Left ventricular diastolic parameters are indeterminate.  Micro Data:  8/24 SARS Cov2> neg 8/24 RVP >> 8/24 Tracheal aspirate >> 8/24 BCx>>   Antimicrobials:  Unasyn 8/24 >>>  Interim history/subjective:  Arrested s/p successful extubation   Objective   Blood pressure (Abnormal) 130/52, pulse 83, temperature 98.8 F (37.1 C), temperature source Esophageal, resp. rate (Abnormal) 4, height 5' 2.01" (1.575 m), weight 89.8 kg, SpO2 94 %. CVP:  [0 mmHg-13 mmHg] 7 mmHg  Vent Mode: PSV;CPAP FiO2 (%):  [40 %-60 %] 40 % Set Rate:  [24 bmp-32 bmp] 24 bmp Vt Set:  [350 mL-400 mL] 400 mL PEEP:  [5 cmH20-10 cmH20] 5 cmH20 Pressure Support:  [5 cmH20-10 cmH20] 5 cmH20 Plateau Pressure:  [28 cmH20-30 cmH20] 29 cmH20   Intake/Output Summary (Last 24 hours)  at 12/07/2019 2409 Last data filed at 12/07/2019 0800 Gross per 24 hour  Intake 2403.46 ml  Output 2250 ml  Net 153.46 ml   Filed Weights   11/20/2019 1600 12/10/2019 2000 12/07/19 0500  Weight: 95.1 kg 90.7 kg 89.8 kg    Examination:  General this is a very pleasant 69 year old  black female was on  pressure support ventilation and in no acute distress; now unresponsive s/p cardiac arrest HEENT normocephalic atraumatic no jugular venous distention appreciated orally intubated mucous membranes moist pupils equal nonreactive no scleral edema Pulmonary diminished, some scattered rhonchi and wheeze however no accessory use on pressure support of 5/PEEP of 5.  Tidal volume in the mid to high 300, and at time 400 range.  Frequency tidal volume in the 70s; now back on full support Cardiac regular rate and rhythm Abdomen soft nontender Extremities are warm, dry, brisk capillary refill trace lower extremity edema GU clear yellow Neuro awake and oriented following commands without focal deficits   Resolved Hospital Problem list   PEA cardiac arrest: Felt to be secondary to respiratory failure with acute pulmonary edema  lactic acidosis status post cardiac arrest Circulatory shock: Status post cardiac arrest felt to be medication related with possible cardiogenic component plus/minus sepsis: Resolved as of 8/26 Assessment & Plan:   Recurrent PEA arrest w/ subsequent cardiogenic shock  2/2 flash Pulmonary edema and subsequent acute respiratory failure Plan Cont tele  MAP goal > 65 (pressors as indicated) PEEP therapy on vent  May need ischemia eval BEFORE we can extubate.  Serial neuro checks   Acute diastolic HF w/ Pulmonary edema, with non-ST elevation MI -had graduated from Dr. B HF clinic, April 2021 -- EF 60%; still 55-60% w/ possible mild septal hypokinesis; RV fxn nml, RV size NML,  Hx HTN Plan Continue telemetry monitoring Continue IV heparin Continue Crestor and aspirin Add back Coreg 25 mg twice a day when BP will tolerate Holding off on Demadex, Cozaar and hydralazine for now  Cardiology has seen, with plans to eventually do ischemia evaluation   Recurrent Acute respiratory failure with hypoxemia in the setting of diffuse pulmonary edema -Cannot exclude  element of aspiration Portable chest x-ray personally reviewed and compared with 8/25.  Endotracheal tubes in satisfactory position however does have slight increase in bilateral airspace disease reflecting likely element of inspiratory timing but also possibly slight increase in edema -She was fully  awake.  Frequency/tidal volume ratio acceptable without accessory use; was extubated but required emergent re-intubation s/p PEA arrest  Plan Full vent support @ 6cc/kg/PBW Cont to titrate PEEP/FIO2 for sats > 90% PAD protocol w/ RASS goal -2 to -3 VAP bundle  Day #3 of 5 Unasyn  Aspiration and reflux precautions Repeat CXR now   Fluid and electrolyte imbalance: Intermittent Plan Replace and recheck with ongoing diuretics  Plan Suspect this has cleared/ will recheck  DM w/ hyperglycemia  Plan Sliding scale insulin with CBG goal 140-180. We will continue current Levemir 12 units every 12 for now.  She is going to be n.p.o. so will hold off from titrating up Best practice:  Diet: EN  Pain/Anxiety/Delirium protocol (if indicated): Fent, prop VAP protocol (if indicated): Yes  DVT prophylaxis: SQ Heparin GI prophylaxis: PPI  Glucose control: SSI  Mobility: BR Code Status: Full  Family Communication: pending Disposition: ICU  My critical care time 92 minutes  Erick Colace ACNP-BC [place] Pager # [phone] OR # [phone] if no answer

## 2019-12-07 NOTE — Progress Notes (Signed)
Inpatient Diabetes Program Recommendations  AACE/ADA: New Consensus Statement on Inpatient Glycemic Control (2015)  Target Ranges:  Prepandial:   less than 140 mg/dL      Peak postprandial:   less than 180 mg/dL (1-2 hours)      Critically ill patients:  140 - 180 mg/dL   Lab Results  Component Value Date   GLUCAP 206 (H) 12/07/2019   HGBA1C 10.6 (A) 06/07/2018    Review of Glycemic Control Results for MAICY, FILIP (MRN [id number]) as of 12/07/2019 10:16  Ref. Range 12/06/2019 21:08 12/07/2019 00:25 12/07/2019 03:44 12/07/2019 08:49  Glucose-Capillary Latest Ref Range: 70 - 99 mg/dL 184 (H) 140 (H) 154 (H) 206 (H)   Diabetes history: Type 2 DM Outpatient Diabetes medications: Lantus 30 units QHS, Novolog 12 units TID Current orders for Inpatient glycemic control: Levemir 12 units BID, Novolog 3-9 units Q4H Vital increased to 50 ml/hr  Inpatient Diabetes Program Recommendations:    Consider adding Novolog 4 units Q4H for tube feed coverage (to be stopped or held in the event tube feeds are stopped).   Thanks, Bronson Curb, MSN, RNC-OB Diabetes Coordinator [phone] (8a-5p)

## 2019-12-07 NOTE — Progress Notes (Signed)
ANTICOAGULATION CONSULT NOTE  Pharmacy Consult for heparin Indication: chest pain/ACS  Patient Measurements: Height: 5\' 2"  (157.5 cm) Weight: 89.8 kg (197 lb 15.6 oz) IBW/kg (Calculated) : 50.1 Heparin Dosing Weight: 72.4 kg  Vital Signs: Temp: 99.5 F (37.5 C) (08/26 1500) Temp Source: Oral (08/26 1210) BP: 128/56 (08/26 1500) Pulse Rate: 86 (08/26 1500)  Labs: Recent Labs    12/12/2019 1422 12/10/2019 1455 12/01/2019 2044 12/02/2019 2231 12/06/19 0500 12/06/19 0504 12/06/19 1605 12/06/19 2206 12/07/19 0304 12/07/19 0315 12/07/19 0600 12/07/19 0928 12/07/19 0928 12/07/19 1138 12/07/19 1506 12/07/19 1706  HGB 14.9   < >  --    < > 13.4   < >  --   --  11.2*   < >  --  12.8   < > 11.2* 11.2*  --   HCT 46.4*   < >  --    < > 38.6   < >  --   --  33.2*   < >  --  39.1  --  33.0* 33.0*  --   PLT 272   < >  --   --  189  --   --   --  157  --   --  PLATELET CLUMPS NOTED ON SMEAR, UNABLE TO ESTIMATE  --   --   --   --   APTT  --   --   --   --   --   --   --   --   --   --   --  51*  --   --   --   --   LABPROT 14.1  --   --   --   --   --   --   --   --   --   --  17.0*  --   --   --   --   INR 1.1  --   --   --   --   --   --   --   --   --   --  1.4*  --   --   --   --   HEPARINUNFRC  --   --   --   --   --   --   --    < > <0.10*   < > <0.10* <0.10*  --   --   --  0.31  CREATININE  --    < >  --   --  0.96   < > 0.95  --  0.90  --   --  1.20*  --   --   --   --   TROPONINIHS  --    < > 8,932*  --   --   --  6,658*  --   --   --   --  2,702*  --   --   --   --    < > = values in this interval not displayed.    Estimated Creatinine Clearance: 47.4 mL/min (A) (by C-G formula based on SCr of 1.2 mg/dL (H)).  Assessment: 69 YO female presenting s/p PEA arrest. Patient called EMS with acute complaints of shortness of breath and was found unresponsive in PEA arrest.  EMS provided 3 rounds of CPR and obtained ROSC prior to ED arrival.  Patient does have a history of PEA arrest in  2018.  No anticoagulation PTA.  Pharmacy was consulted to start heparin to  rule out ACS.  Heparin level this evening is therapeutic after restarting after line replacement (HL 0.31, goal of 0.3-0.7). Hgb 11.2 and plts clumped on AM labs. No bleeding and infusing appropriately per discussion with RN.   Goal of Therapy:  Heparin level 0.3-0.7 units/ml Monitor platelets by anticoagulation protocol: Yes   Plan:  - Increase Heparin 1050 units/hr (10.5 ml/hr) - Will continue to monitor for any signs/symptoms of bleeding and will follow up with heparin level in 6 hours   Thank you for allowing pharmacy to be a part of this patient's care.  Alycia Rossetti, PharmD, BCPS Clinical Pharmacist Clinical phone for 12/07/2019: [phone] 12/07/2019 5:58 PM   **Pharmacist phone directory can now be found on [email or web address] ([email or web address]).  Listed under [place].

## 2019-12-07 NOTE — Progress Notes (Signed)
Post ROSC / Intubation, patient  neurologically intact and able to follow commands.  Nods head "yes" she understands the situation when asked, "no" and "yes" to pain responses, and gives 'thumbs up' appropriately.    Tolerating MV, taking spontaneous breaths intermittent.    Family has been notified, sister is on the way.  See post code documentation on chart.

## 2019-12-07 NOTE — Code Documentation (Signed)
Post extubation PEA cardiac arrest.  Quickly deteriorated post extubation with frothy secretions from her mouth. Chest compressions and BVM initiated.  Intubated during code by NP Babcock.  Epinephrine and chest compressions per ACLS protocol.  Bicarb x1 administered.  With ROSC tachycardic, regular, wide-complex and hypertensive.  Due to persistent tachycardia, although rhythm narrowing, she was given 150 mg amiodarone IV push with heart rate returning to around 100.  Hypertension improved to MAP in 90s.  Cardiac ultrasound with hyperdynamic function at the time of ROSC.  Persistent hypoxia with saturations in the mid 80s.  Bilateral lung slide on ultrasound.  Weaker breath sounds on the left compared to the right.  CXR confirms appropriate tube placement, no pneumothorax. Kerly B lines bilaterally and diffuse infiltrates, new compared to morning chest x-ray.  Family updated. Vent adjustments made to increase PEEP to optimize saturations. Will require heavy sedation until saturations recover.   Opening her eyes spontaneously, tracking.  Following commands with encouragement in all 4 extremities.  Julian Hy, DO 12/07/19 9:55 AM [facility]

## 2019-12-07 NOTE — Progress Notes (Signed)
Progress Note  Patient Name: [name] Bean Date of Encounter: 12/07/2019  [facility] Cardiologist: No primary care provider on file. [place].  Subjective   Intubated but awake, alert, and capable of following commands.  She denies chest pain.  Inpatient Medications    Scheduled Meds: . aspirin  81 mg Per Tube Daily  . chlorhexidine gluconate (MEDLINE KIT)  15 mL Mouth Rinse BID  . Chlorhexidine Gluconate Cloth  6 each Topical Daily  . docusate  100 mg Per Tube BID  . fentaNYL (SUBLIMAZE) injection  100 mcg Intravenous NOW  . furosemide  80 mg Intravenous Q12H  . insulin aspart  3-9 Units Subcutaneous Q4H  . insulin detemir  12 Units Subcutaneous Q12H  . mouth rinse  15 mL Mouth Rinse 10 times per day  .  morphine injection  2 mg Intravenous Once  . polyethylene glycol  17 g Per Tube Daily  . potassium chloride  40 mEq Per Tube Once  . rosuvastatin  20 mg Per Tube Daily  . sertraline  50 mg Per Tube Daily  . sodium chloride flush  10 mL Intracatheter Q12H   Continuous Infusions: . sodium chloride 10 mL/hr at 12/07/19 0800  . ampicillin-sulbactam (UNASYN) IV 3 g (12/07/19 0449)  . dexmedetomidine (PRECEDEX) IV infusion Stopped (12/07/19 0731)  . heparin 900 Units/hr (12/06/19 1300)  . labetalol (NORMODYNE) infusion 5 mg/mL     PRN Meds: sodium chloride, acetaminophen, docusate sodium, labetalol, ondansetron (ZOFRAN) IV   Vital Signs    Vitals:   12/07/19 0700 12/07/19 0733 12/07/19 0800 12/07/19 0815  BP: 139/60  (!) 130/52   Pulse: 76  83   Resp: (!) 7  (!) 4   Temp: 98.4 F (36.9 C)  98.8 F (37.1 C)   TempSrc: Esophageal  Esophageal   SpO2: 100% 97% 96% 94%  Weight:      Height:        Intake/Output Summary (Last 24 hours) at 12/07/2019 1003 Last data filed at 12/07/2019 0800 Gross per 24 hour  Intake 2398.26 ml  Output 2100 ml  Net 298.26 ml   Last 3 Weights 12/07/2019 11/13/2019 11/17/2019  Weight (lbs) 197 lb 15.6 oz 199 lb 15.3 oz  209 lb 10.5 oz  Weight (kg) 89.8 kg 90.7 kg 95.1 kg      Telemetry    Normal sinus rhythm without atrial fibrillation.- Personally Reviewed  ECG    Performed 12/06/2019 demonstrates left bundle branch block, QS pattern V1 through V4, suggestion of inferior Q waves, no acute ST-T wave change.- Personally Reviewed  Physical Exam  Obese African-American female GEN: No acute distress, but still on ventilator. Neck:  Unable to assess JVD Cardiac:  Heart tones are distant.  No obvious murmur. Respiratory: Clear to auscultation bilaterally. GI: Soft, nontender, non-distended  MS: No edema; No deformity. Neuro:  Following commands. Psych: Normal affect   Labs    High Sensitivity Troponin:   Recent Labs  Lab 11/18/2019 1530 12/10/2019 2044 12/06/19 1605  TROPONINIHS 293* 8,932* 6,658*      Chemistry Recent Labs  Lab 11/23/2019 1530 11/25/2019 2014 12/06/19 0500 12/06/19 0504 12/06/19 1605 12/07/19 0304 12/07/19 0315  NA 136   < > 139   < > 140 140 142  K 3.8   < > 3.6   < > 3.8 3.7 3.7  CL 107  --  110  --  107 108  --   CO2 19*  --  20*  --  21* 22  --   GLUCOSE 441*  --  174*  --  219* 153*  --   BUN 11  --  13  --  13 13  --   CREATININE 0.87  --  0.96  --  0.95 0.90  --   CALCIUM 7.5*  --  8.1*  --  8.1* 8.0*  --   PROT 5.9*  --   --   --  6.0* 5.9*  --   ALBUMIN 2.8*  --   --   --  2.8* 2.7*  --   AST 38  --   --   --  59* 56*  --   ALT 21  --   --   --  24 24  --   ALKPHOS 106  --   --   --  84 79  --   BILITOT 0.5  --   --   --  0.7 1.0  --   GFRNONAA >60  --  >60  --  >60 >60  --   GFRAA >60  --  >60  --  >60 >60  --   ANIONGAP 10  --  9  --  12 10  --    < > = values in this interval not displayed.     Hematology Recent Labs  Lab 12/08/2019 1422 12/06/2019 1455 12/06/19 0500 12/06/19 0504 12/06/19 1506 12/07/19 0304 12/07/19 0315  WBC 8.7  --  18.7*  --   --  11.6*  --   RBC 4.95  --  4.23  --   --  3.68*  --   HGB 14.9   < > 13.4   < > 11.9* 11.2* 10.2*   HCT 46.4*   < > 38.6   < > 35.0* 33.2* 30.0*  MCV 93.7  --  91.3  --   --  90.2  --   MCH 30.1  --  31.7  --   --  30.4  --   MCHC 32.1  --  34.7  --   --  33.7  --   RDW 12.2  --  12.4  --   --  12.7  --   PLT 272  --  189  --   --  157  --    < > = values in this interval not displayed.    BNP Recent Labs  Lab 11/17/2019 1419 12/07/19 0304  BNP 162.7* 192.2*     DDimer No results for input(s): DDIMER in the last 168 hours.   Radiology    CT HEAD WO CONTRAST  Result Date: 12/06/2019 CLINICAL DATA:  Altered mental status, cardiac arrest EXAM: CT HEAD WITHOUT CONTRAST TECHNIQUE: Contiguous axial images were obtained from the base of the skull through the vertex without intravenous contrast. COMPARISON:  06/20/2016 FINDINGS: Brain: Normal anatomic configuration. No abnormal intra or extra-axial mass lesion or fluid collection. No abnormal mass effect or midline shift. No evidence of acute intracranial hemorrhage or infarct. Ventricular size is normal. Cerebellum unremarkable. Vascular: Unremarkable Skull: Intact Sinuses/Orbits: Small air-fluid levels are noted within the sphenoid sinus, nonspecific in the setting of intubation. Remaining paranasal sinuses are clear. Orbits are unremarkable. Other: Mastoid air cells and middle ear cavities are clear. IMPRESSION: 1. No acute intracranial abnormality. Electronically Signed   By: Fidela Salisbury MD   On: 12/06/2019 01:22   CT ANGIO CHEST PE W OR WO CONTRAST  Result Date: 12/06/2019 CLINICAL DATA:  69 year old female with  concern for pulmonary embolism. Status post cardiac arrest. EXAM: CT ANGIOGRAPHY CHEST WITH CONTRAST TECHNIQUE: Multidetector CT imaging of the chest was performed using the standard protocol during bolus administration of intravenous contrast. Multiplanar CT image reconstructions and MIPs were obtained to evaluate the vascular anatomy. CONTRAST:  26m OMNIPAQUE IOHEXOL 350 MG/ML SOLN COMPARISON:  Chest radiograph dated  11/22/2019. FINDINGS: Cardiovascular: There is no cardiomegaly or pericardial effusion. Three-vessel coronary vascular calcification noted. There is mild atherosclerotic calcification of the thoracic aorta. No pulmonary artery embolus identified. Mediastinum/Nodes: Top-normal hilar lymph nodes measure 10 mm in short axis on the right. Subcarinal lymph node measures 13 mm in short axis. An enteric tube is noted within the esophagus extending into the stomach. No mediastinal fluid collection. Lungs/Pleura: There are small bilateral pleural effusions. There is diffuse interstitial and interlobular septal prominence with diffuse ground-glass airspace opacity throughout the lungs. Findings may represent edema or ARDS. Clusters of airspace consolidation primarily involving the left lower lobe as well as upper lobes may represent pneumonia. Clinical correlation recommended. There is no pneumothorax. The central airways are patent. An endotracheal tube is noted with tip above the carina. Upper Abdomen: Indeterminate left adrenal thickening measure 17 mm in thickness. Musculoskeletal: There is degenerative changes of the spine. No acute osseous pathology. Review of the MIP images confirms the above findings. IMPRESSION: 1. No CT evidence of pulmonary embolism. 2. Small bilateral pleural effusions with findings of pulmonary edema or ARDS. 3. Clusters of airspace consolidation primarily involving the left lower lobe as well as upper lobes may represent pneumonia. Clinical correlation recommended. 4. Aortic Atherosclerosis (ICD10-I70.0). Electronically Signed   By: AAnner CreteM.D.   On: 12/06/2019 01:31   DG CHEST PORT 1 VIEW  Result Date: 12/07/2019 CLINICAL DATA:  Hypoxia.  Status post cardiac arrest EXAM: PORTABLE CHEST 1 VIEW COMPARISON:  December 07, 2019 study obtained earlier in the day FINDINGS: Endotracheal tube tip is 1.7 cm above the carina. Central catheter tip is in the superior vena cava. Nasogastric tube  no longer present. No pneumothorax. Extensive airspace opacity is present, essentially stable on the left and overall increased on the right, particular in the right perihilar region. Heart size is borderline prominent with pulmonary vascularity normal. There is aortic atherosclerosis. No adenopathy. No bone lesions. IMPRESSION: Nasogastric tube no longer present. Other tube and catheter positions unchanged. No pneumothorax appreciable. Widespread airspace opacity, stable on the left and increased on the right. Given clinical history, a degree of aspiration on the right must be of concern. Stable cardiac silhouette. Aortic Atherosclerosis (ICD10-I70.0). Electronically Signed   By: WLowella GripIII M.D.   On: 12/07/2019 09:56   DG Chest Port 1 View  Result Date: 12/07/2019 CLINICAL DATA:  Hypoxia EXAM: PORTABLE CHEST 1 VIEW COMPARISON:  December 06, 2019 chest radiograph and CT angiogram chest FINDINGS: Endotracheal tube tip is 3.3 cm above the carina. Nasogastric tube tip and side port are below the diaphragm. Central catheter tip is in the superior vena cava. No pneumothorax. There is patchy airspace opacity throughout the lungs bilaterally, essentially stable compared to 1 day prior. No new opacity evident. Heart is mildly enlarged with pulmonary vascularity normal. No adenopathy. Postoperative change noted in the lower cervical spine. IMPRESSION: Tube and catheter positions as described without pneumothorax. Airspace opacity bilaterally consistent with multifocal pneumonia, essentially stable compared to 1 day prior. No new opacity evident. Stable cardiac prominence. Electronically Signed   By: WLowella GripIII M.D.   On: 12/07/2019 08:07  DG Chest Port 1 View  Result Date: 12/06/2019 CLINICAL DATA:  Hypoxemia. EXAM: PORTABLE CHEST 1 VIEW COMPARISON:  December 05, 2019. FINDINGS: Stable cardiomediastinal silhouette. Endotracheal and nasogastric tubes are unchanged in position. Left internal jugular  catheter is unchanged in position. Decreased pulmonary opacities are noted suggesting improving pneumonia. No pneumothorax or pleural effusion is noted. Bony thorax is unremarkable. IMPRESSION: Stable support apparatus. Decreased pulmonary opacities are noted suggesting improving pneumonia. Electronically Signed   By: Marijo Conception M.D.   On: 12/06/2019 08:15   DG CHEST PORT 1 VIEW  Result Date: 12/09/2019 CLINICAL DATA:  Endotracheal tube EXAM: PORTABLE CHEST 1 VIEW COMPARISON:  Earlier same day FINDINGS: Endotracheal tube is approximately 2.5 cm above the carina. Enteric tube passes below the diaphragm with tip out of field of view. New left IJ central line tip overlies the SVC. Persistent diffuse bilateral pulmonary opacities without substantial change in lung aeration. No pleural effusion. No pneumothorax. Normal heart size. IMPRESSION: Lines and tubes as above. Persistent diffuse bilateral pulmonary opacities without substantial change. Electronically Signed   By: Macy Mis M.D.   On: 11/12/2019 16:26   DG Chest Port 1 View  Result Date: 12/10/2019 CLINICAL DATA:  Cardiac arrest. EXAM: PORTABLE CHEST 1 VIEW COMPARISON:  Chest x-ray dated June 02, 2004. FINDINGS: Endotracheal tube tip 1.6 cm above the carina. Enteric tube entering the stomach with the tip below the field of view. The heart size and mediastinal contours are within normal limits. Diffuse opacities throughout both lungs, denser on the right, with relative subpleural sparing. No pneumothorax or large pleural effusion. No acute osseous abnormality. IMPRESSION: 1. Appropriately positioned endotracheal tube. 2. Diffuse opacities throughout both lungs, suspicious for capillary leak pulmonary edema such as from prolonged hypotension given clinical history. Diffuse alveolar hemorrhage or ARDS could have a similar appearance. Electronically Signed   By: Titus Dubin M.D.   On: 11/28/2019 14:43   ECHOCARDIOGRAM COMPLETE  Result  Date: 11/26/2019    ECHOCARDIOGRAM REPORT   Patient Name:   AZRA ABRELL Skidgel Date of Exam: 11/21/2019 Medical Rec #:  [id number]                Height:       62.0 in Accession #:    [id number]               Weight:       209.7 lb Date of Birth:  Jul 30, 1950                 BSA:          1.950 m Patient Age:    69 years                 BP:           126/52 mmHg Patient Gender: F                        HR:           128 bpm. Exam Location:  Inpatient Procedure: 2D Echo and Intracardiac Opacification Agent STAT ECHO Indications:    Cardiac arrest I46.9  History:        Patient has no prior history of Echocardiogram examinations.                 CHF, CAD; Risk Factors:Dyslipidemia. Anemia.  Sonographer:    Darlina Sicilian RDCS Referring Phys: [id number] GRACE E BOWSER  Sonographer Comments: Technically difficult study  due to poor echo windows and echo performed with patient supine and on artificial respirator. IMPRESSIONS  1. Endocardial border definition is poor but systolic function appears to be grossly normal. Cannot rule out mild septal hypokinesis. Left ventricular ejection fraction, by estimation, is 55 to 60%. The left ventricle has normal function. The left ventricle has no regional wall motion abnormalities. Left ventricular diastolic parameters are indeterminate.  2. Right ventricular systolic function is normal. The right ventricular size is normal. There is mildly elevated pulmonary artery systolic pressure.  3. The mitral valve is normal in structure. No evidence of mitral valve regurgitation. No evidence of mitral stenosis.  4. The aortic valve is normal in structure. Aortic valve regurgitation is not visualized. No aortic stenosis is present.  5. The inferior vena cava is normal in size with <50% respiratory variability, suggesting right atrial pressure of 8 mmHg. FINDINGS  Left Ventricle: Endocardial border definition is poor but systolic function appears to be grossly normal. Cannot rule out mild  septal hypokinesis. Left ventricular ejection fraction, by estimation, is 55 to 60%. The left ventricle has normal function. The left ventricle has no regional wall motion abnormalities. Definity contrast agent was given IV to delineate the left ventricular endocardial borders. The left ventricular internal cavity size was normal in size. There is no left ventricular hypertrophy. Left ventricular diastolic parameters are indeterminate. Right Ventricle: The right ventricular size is normal. No increase in right ventricular wall thickness. Right ventricular systolic function is normal. There is mildly elevated pulmonary artery systolic pressure. The tricuspid regurgitant velocity is 2.76  m/s, and with an assumed right atrial pressure of 8 mmHg, the estimated right ventricular systolic pressure is 18.8 mmHg. Left Atrium: Left atrial size was normal in size. Right Atrium: Right atrial size was normal in size. Pericardium: There is no evidence of pericardial effusion. Mitral Valve: The mitral valve is normal in structure. Moderately decreased mobility of the mitral valve leaflets. Moderate mitral annular calcification. No evidence of mitral valve regurgitation. No evidence of mitral valve stenosis. Tricuspid Valve: The tricuspid valve is normal in structure. Tricuspid valve regurgitation is mild . No evidence of tricuspid stenosis. Aortic Valve: The aortic valve is normal in structure.. There is mild thickening and mild calcification of the aortic valve. Aortic valve regurgitation is not visualized. No aortic stenosis is present. There is mild thickening of the aortic valve. There is mild calcification of the aortic valve. Pulmonic Valve: The pulmonic valve was normal in structure. Pulmonic valve regurgitation is not visualized. No evidence of pulmonic stenosis. Aorta: The aortic root is normal in size and structure. Venous: The inferior vena cava is normal in size with less than 50% respiratory variability, suggesting  right atrial pressure of 8 mmHg. IAS/Shunts: No atrial level shunt detected by color flow Doppler.  LEFT VENTRICLE PLAX 2D LVOT diam:     1.60 cm LV SV:         18 LV SV Index:   9 LVOT Area:     2.01 cm  AORTIC VALVE LVOT Vmax:   62.70 cm/s LVOT Vmean:  41.300 cm/s LVOT VTI:    0.087 m  AORTA Ao Root diam: 2.40 cm TRICUSPID VALVE TR Peak grad:   30.5 mmHg TR Vmax:        276.00 cm/s  SHUNTS Systemic VTI:  0.09 m Systemic Diam: 1.60 cm Skeet Latch MD Electronically signed by Skeet Latch MD Signature Date/Time: 12/04/2019/6:19:04 PM    Final     Cardiac Studies  Recent echo demonstrates EF 55%.  Patient Profile     69 y.o. female with a hx of cardiac arrest 2018, CAD S/P PCI Stent to RCA 2002 and distal LAD 1624, chronic diastolic heart failure, HTN, Hyperlipidemia, spinal stenosis, tobacco use and diabetis who is being seen today for the evaluation of NSTEMI.  Assessment & Plan    1. PEA cardiac arrest, uncertain etiology.  Not felt to be an ischemic event. 2. Non ST elevation myocardial infarction, exclude coronary occlusion and previously stented sites versus new acute coronary lesion.  Will need coronary angiography when off support and more clinically stable.  Probably Monday.  Will reassess in a.m. 3. Prior history of coronary stents involving the RCA and LAD. 4. Chronic diastolic heart failure, difficult to assess current volume status. 5. New left bundle branch block     For questions or updates, please contact [place] Please consult [email or web address] for contact info under        Signed, Sinclair Grooms, MD  12/07/2019, 10:03 AM

## 2019-12-07 NOTE — Progress Notes (Signed)
Nutrition Follow Up  DOCUMENTATION CODES:   Not applicable  INTERVENTION:   Once feeding access obtained and confirmed, restart tube feeding  -Vital AF 1.2 at 60 ml/hr via OG (1440 ml) -Pro-Source TF 45 mL BID Provides 130 g of protein, 1808 kcals, 1167 mL of free water  NUTRITION DIAGNOSIS:   Inadequate oral intake related to acute illness as evidenced by NPO status.  Ongoing  GOAL:   Patient will meet greater than or equal to 90% of their needs   Addressed via TF  MONITOR:   Vent status, Labs, Weight trends, TF tolerance  REASON FOR ASSESSMENT:   Consult, Ventilator Enteral/tube feeding initiation and management  ASSESSMENT:   69 yo female admitted post PEA arrest requiring intubation, shock. PMH includes HTN, DM, CAD s/p PCI, CHF   Extubated this am. PEA arrested after and had to be re-intubated. Now requiring levophed. Off Propofol. Okay to restart TF. Rate adjustment made to better meet needs.   Day 2 without BM. May need scheduled regimen if no result in near future.   Admission weight: 95.1 kg  Current weight: 89.8 kg   Patient is currently intubated on ventilator support MV: 8.0 L/min Temp (24hrs), Avg:99.2 F (37.3 C), Min:98.4 F (36.9 C), Max:100.2 F (37.9 C)  I/O: +340 ml since admit  UOP: 2,600 ml x 24 hrs   Drips: precedex, levophed  Medications: colace, 80 mg lasix BID, levemir, SS novolog, miralax Labs: CBG 124-320  Diet Order:   Diet Order            Diet NPO time specified  Diet effective now                 EDUCATION NEEDS:   Not appropriate for education at this time  Skin:  Skin Assessment: Reviewed RN Assessment  Last BM:  PTA  Height:   Ht Readings from Last 1 Encounters:  12/07/19 5\' 2"  (1.575 m)    Weight:   Wt Readings from Last 1 Encounters:  12/07/19 89.8 kg    BMI:  Body mass index is 36.21 kg/m.  Estimated Nutritional Needs:   Kcal:  1760 kcal  Protein:  109-136 g  Fluid:  >/= 1.8  L   Mariana Single RD, LDN Clinical Nutrition Pager listed in [place]

## 2019-12-07 NOTE — Progress Notes (Signed)
   12/07/19 0900  Clinical Encounter Type  Visited With Health care provider  Visit Type Code  Referral From Nurse  Consult/Referral To Chaplain   Chaplain responded to code blue. No family present. Chaplains remain available for support.   Chaplain Resident, Evelene Croon, MDiv [phone] on-call pager

## 2019-12-07 NOTE — Progress Notes (Signed)
Patient was hemodynamically stable this morning, awake, oriented, following all commands.  Had completed spontaneous breathing trial on pressure support of 5/PEEP 5 with excellent F/VT ratio in the 70 range, without any notable nasal flare, abdominal accessory use or elevated work of breathing.  Following extubation she had fairly significant dyspnea, copious frothy oral secretions, and rapid worsening of work of breathing and associated hypertension.  She was unable to tolerate BiPAP, supplemental oxygen was rapidly titrated up.  While I was in the room she became agonal, progressed to bradycardia cardiac and then PEA arrest a CODE BLUE was called.  We initiated ACLS during which time she received 3 rounds of epinephrine, she was intubated emergently during the first pulse check successfully, she had frothy pink pulmonary secretions from the airway.  Return of spontaneous circulation was achieved once again at approximately 9 minutes.  Follow-up chest x-ray shows significant increase in pulmonary edema.  At time of dictation she is hemodynamically stable.  I have updated her sister via phone who is in route to [facility] today. Plan As outlined in progress note Continue full ventilator support Heavy sedation with PAD protocol RASS goal -3 Lung protective ventilation IV diuresis Afterload reduction as indicated  Erick Colace ACNP-BC [place] Pager # [phone] OR # [phone] if no answer

## 2019-12-07 NOTE — Progress Notes (Addendum)
[place] for heparin Indication: chest pain/ACS  Allergies  Allergen Reactions  . Sitagliptin Other (See Comments)    Had pancreatitis while on Januvia     Patient Measurements: Height: 5' 2.01" (157.5 cm) Weight: 89.8 kg (197 lb 15.6 oz) IBW/kg (Calculated) : 50.12 Heparin Dosing Weight: 72.4 kg  Vital Signs: Temp: 98.8 F (37.1 C) (08/26 0800) Temp Source: Esophageal (08/26 0800) BP: 130/52 (08/26 0800) Pulse Rate: 83 (08/26 0800)  Labs: Recent Labs    11/29/2019 1422 11/17/2019 1455 12/06/2019 1530 11/19/2019 2014 12/03/2019 2044 11/21/2019 2231 12/06/19 0500 12/06/19 0504 12/06/19 1506 12/06/19 1506 12/06/19 1605 12/06/19 2206 12/07/19 0304 12/07/19 0315  HGB 14.9   < >  --    < >  --    < > 13.4   < > 11.9*   < >  --   --  11.2* 10.2*  HCT 46.4*   < >  --    < >  --    < > 38.6   < > 35.0*  --   --   --  33.2* 30.0*  PLT 272  --   --   --   --   --  189  --   --   --   --   --  157  --   LABPROT 14.1  --   --   --   --   --   --   --   --   --   --   --   --   --   INR 1.1  --   --   --   --   --   --   --   --   --   --   --   --   --   HEPARINUNFRC  --   --   --   --   --   --   --   --   --   --   --  0.33 <0.10*  --   CREATININE  --    < > 0.87  --   --   --  0.96  --   --   --  0.95  --  0.90  --   TROPONINIHS  --   --  293*  --  8,932*  --   --   --   --   --  6,658*  --   --   --    < > = values in this interval not displayed.    Estimated Creatinine Clearance: 63.2 mL/min (by C-G formula based on SCr of 0.9 mg/dL).  Assessment: 69 YO female presenting s/p PEA arrest. Patient called EMS with acute complaints of shortness of breath and was found unresponsive in PEA arrest.  EMS provided 3 rounds of CPR and obtained ROSC prior to ED arrival.  Patient does have a history of PEA arrest in 2018.  No anticoagulation PTA.  Pharmacy was consulted to start heparin to rule out ACS.  Heparin level overnight dropped to < 0.10. Repeat  level this AM was also < 0.10.  Heparin level was previously therapeutic at 0.33 on 900 units/hr.  Patient experienced PEA arrest this AM and during the code, heparin line was found to be occluded under the BP cuff.  Line removed and reinserted.  Anticipate patient would have continued to be therapeutic on 900 units/hr, but will increase rate slightly since line was occluded  and patient was not receiving heparin.   Goal of Therapy:  Heparin level 0.3-0.7 units/ml Monitor platelets by anticoagulation protocol: Yes   Plan:  Increase heparin drip to 1000 units/hr Check 6 hour HL Monitor daily CBC and signs of bleeding  Dimple Nanas, PharmD PGY-1 Acute Care Pharmacy Resident 12/07/2019 9:42 AM

## 2019-12-07 NOTE — Progress Notes (Signed)
Extubated patient per order. Patient went into respiratory distress. attempted BIPAP and Albuterol neb. Then code called. Reintubated patient.

## 2019-12-08 ENCOUNTER — Inpatient Hospital Stay (HOSPITAL_COMMUNITY): Payer: Medicare PPO

## 2019-12-08 DIAGNOSIS — I5033 Acute on chronic diastolic (congestive) heart failure: Secondary | ICD-10-CM

## 2019-12-08 LAB — GLUCOSE, CAPILLARY
Glucose-Capillary: 114 mg/dL — ABNORMAL HIGH (ref 70–99)
Glucose-Capillary: 161 mg/dL — ABNORMAL HIGH (ref 70–99)
Glucose-Capillary: 205 mg/dL — ABNORMAL HIGH (ref 70–99)
Glucose-Capillary: 212 mg/dL — ABNORMAL HIGH (ref 70–99)
Glucose-Capillary: 245 mg/dL — ABNORMAL HIGH (ref 70–99)
Glucose-Capillary: 249 mg/dL — ABNORMAL HIGH (ref 70–99)
Glucose-Capillary: 99 mg/dL (ref 70–99)

## 2019-12-08 LAB — POCT I-STAT 7, (LYTES, BLD GAS, ICA,H+H)
Acid-Base Excess: 5 mmol/L — ABNORMAL HIGH (ref 0.0–2.0)
Bicarbonate: 29.8 mmol/L — ABNORMAL HIGH (ref 20.0–28.0)
Calcium, Ion: 1.17 mmol/L (ref 1.15–1.40)
HCT: 29 % — ABNORMAL LOW (ref 36.0–46.0)
Hemoglobin: 9.9 g/dL — ABNORMAL LOW (ref 12.0–15.0)
O2 Saturation: 99 %
Patient temperature: 37.5
Potassium: 3.7 mmol/L (ref 3.5–5.1)
Sodium: 144 mmol/L (ref 135–145)
TCO2: 31 mmol/L (ref 22–32)
pCO2 arterial: 46.2 mmHg (ref 32.0–48.0)
pH, Arterial: 7.42 (ref 7.350–7.450)
pO2, Arterial: 145 mmHg — ABNORMAL HIGH (ref 83.0–108.0)

## 2019-12-08 LAB — BASIC METABOLIC PANEL
Anion gap: 11 (ref 5–15)
Anion gap: 13 (ref 5–15)
BUN: 18 mg/dL (ref 8–23)
BUN: 25 mg/dL — ABNORMAL HIGH (ref 8–23)
CO2: 27 mmol/L (ref 22–32)
CO2: 29 mmol/L (ref 22–32)
Calcium: 8.8 mg/dL — ABNORMAL LOW (ref 8.9–10.3)
Calcium: 8.6 mg/dL — ABNORMAL LOW (ref 8.9–10.3)
Chloride: 104 mmol/L (ref 98–111)
Chloride: 101 mmol/L (ref 98–111)
Creatinine, Ser: 1.37 mg/dL — ABNORMAL HIGH (ref 0.44–1.00)
Creatinine, Ser: 1.57 mg/dL — ABNORMAL HIGH (ref 0.44–1.00)
GFR calc Af Amer: 46 mL/min — ABNORMAL LOW (ref >60)
GFR calc Af Amer: 39 mL/min — ABNORMAL LOW (ref >60)
GFR calc non Af Amer: 40 mL/min — ABNORMAL LOW (ref >60)
GFR calc non Af Amer: 34 mL/min — ABNORMAL LOW (ref >60)
Glucose, Bld: 94 mg/dL (ref 70–99)
Glucose, Bld: 195 mg/dL — ABNORMAL HIGH (ref 70–99)
Potassium: 3.9 mmol/L (ref 3.5–5.1)
Potassium: 4 mmol/L (ref 3.5–5.1)
Sodium: 142 mmol/L (ref 135–145)
Sodium: 143 mmol/L (ref 135–145)

## 2019-12-08 LAB — CBC
HCT: 30.9 % — ABNORMAL LOW (ref 36.0–46.0)
Hemoglobin: 10.8 g/dL — ABNORMAL LOW (ref 12.0–15.0)
MCH: 32.7 pg (ref 26.0–34.0)
MCHC: 35 g/dL (ref 30.0–36.0)
MCV: 93.6 fL (ref 80.0–100.0)
Platelets: 153 10*3/uL (ref 150–400)
RBC: 3.3 MIL/uL — ABNORMAL LOW (ref 3.87–5.11)
RDW: 12.9 % (ref 11.5–15.5)
WBC: 14.6 10*3/uL — ABNORMAL HIGH (ref 4.0–10.5)
nRBC: 0 % (ref 0.0–0.2)

## 2019-12-08 LAB — BRAIN NATRIURETIC PEPTIDE: B Natriuretic Peptide: 228.5 pg/mL — ABNORMAL HIGH (ref 0.0–100.0)

## 2019-12-08 LAB — CALCIUM, IONIZED: Calcium, Ionized, Serum: 4.1 mg/dL — ABNORMAL LOW (ref 4.5–5.6)

## 2019-12-08 LAB — HEPARIN LEVEL (UNFRACTIONATED)
Heparin Unfractionated: 0.33 IU/mL (ref 0.30–0.70)
Heparin Unfractionated: 0.43 IU/mL (ref 0.30–0.70)

## 2019-12-08 MED ORDER — POTASSIUM CHLORIDE 20 MEQ/15ML (10%) PO SOLN
40.0000 meq | Freq: Two times a day (BID) | ORAL | Status: DC
  Administered 2019-12-08 – 2019-12-09 (×3): 40 meq via ORAL
  Filled 2019-12-08 (×3): qty 30

## 2019-12-08 MED FILL — Medication: Qty: 1 | Status: AC

## 2019-12-08 NOTE — Progress Notes (Addendum)
-  Patient doing well today through shift. Neurologically intact.  +Cough. +Gag. +Corneals. PERRLA.  Follows commands Equal BLE/BUE.   -Tolerating MV; PRVC/ 50%/10 PEEP Taking intermittent spontaneous breaths  -Precedex and Fentenyl gtt titrations.   -Levophed weaned /titrated from 1-59mcg/min through shift for support. MAP >65  -Diuresed 2275 ml for 12hr / -3774 since admit Cr recheck 1.57 (1.37 this AM)  -Tmax 38.8 - Tylenol given.  CCM aware on rounds.   CXR this AM. WBC 14.6 (13.1 prior).   Pt tolerating CPT through shift, full range. Minimal thick secretions through inline suction.   BldClt 8/24: neg at 3 days MRSA 8/24: neg COVID: neg UA: complete (glucose / protein)  Sister at bedside, updated  Chaplain consulted and to bedside

## 2019-12-08 NOTE — Progress Notes (Signed)
ANTICOAGULATION CONSULT NOTE  Pharmacy Consult for heparin Indication: chest pain/ACS  Patient Measurements: Height: 5\' 2"  (157.5 cm) Weight: 89.8 kg (197 lb 15.6 oz) IBW/kg (Calculated) : 50.1 Heparin Dosing Weight: 72.4 kg  Vital Signs: Temp: 100 F (37.8 C) (08/27 0000) Temp Source: Esophageal (08/26 2000) BP: 104/42 (08/27 0000) Pulse Rate: 78 (08/27 0000)  Labs: Recent Labs    11/20/2019 1422 12/10/2019 1455 12/10/2019 2044 11/18/2019 2231 12/06/19 0500 12/06/19 0504 12/06/19 1605 12/06/19 2206 12/07/19 0304 12/07/19 0315 12/07/19 0928 12/07/19 0928 12/07/19 1138 12/07/19 1506 12/07/19 1706 12/08/19 0000  HGB 14.9   < >  --    < > 13.4   < >  --   --  11.2*   < > 12.8   < > 11.2* 11.2*  --   --   HCT 46.4*   < >  --    < > 38.6   < >  --   --  33.2*   < > 39.1  --  33.0* 33.0*  --   --   PLT 272   < >  --   --  189  --   --   --  157  --  PLATELET CLUMPS NOTED ON SMEAR, UNABLE TO ESTIMATE  --   --   --   --   --   APTT  --   --   --   --   --   --   --   --   --   --  51*  --   --   --   --   --   LABPROT 14.1  --   --   --   --   --   --   --   --   --  17.0*  --   --   --   --   --   INR 1.1  --   --   --   --   --   --   --   --   --  1.4*  --   --   --   --   --   HEPARINUNFRC  --   --   --   --   --   --   --    < > <0.10*   < > <0.10*  --   --   --  0.31 0.33  CREATININE  --    < >  --   --  0.96   < > 0.95  --  0.90  --  1.20*  --   --   --   --   --   TROPONINIHS  --    < > 8,932*  --   --   --  7,616*  --   --   --  2,702*  --   --   --   --   --    < > = values in this interval not displayed.    Estimated Creatinine Clearance: 47.4 mL/min (A) (by C-G formula based on SCr of 1.2 mg/dL (H)).  Assessment: 69 YO female presenting s/p PEA arrest. Patient called EMS with acute complaints of shortness of breath and was found unresponsive in PEA arrest.  EMS provided 3 rounds of CPR and obtained ROSC prior to ED arrival.  Patient does have a history of PEA arrest in  2018.  No anticoagulation PTA.  Pharmacy was consulted to start heparin to  rule out ACS.  Heparin level remains therapeutic (0.33) on gtt at 1050 units/hr. No bleeding noted.  Goal of Therapy:  Heparin level 0.3-0.7 units/ml Monitor platelets by anticoagulation protocol: Yes   Plan:  - Continue heparin 1050 units/hr (10.5 ml/hr) - F/u daily heparin level  Sherlon Handing, PharmD, BCPS Please see amion for complete clinical pharmacist phone list 12/08/2019 1:05 AM

## 2019-12-08 NOTE — Progress Notes (Signed)
Progress Note  Patient Name: [name] Bean Date of Encounter: 12/08/2019  [facility] Cardiologist: No primary care provider on file. Bensimhon.  Subjective   She is awake and somewhat animated.  She is unable to talk but trying to communicate.  She seems frustrated.  Inpatient Medications    Scheduled Meds: . aspirin  81 mg Per Tube Daily  . chlorhexidine gluconate (MEDLINE KIT)  15 mL Mouth Rinse BID  . Chlorhexidine Gluconate Cloth  6 each Topical Daily  . docusate  100 mg Per Tube BID  . feeding supplement (PROSource TF)  45 mL Per Tube BID  . fentaNYL (SUBLIMAZE) injection  25 mcg Intravenous Once  . insulin aspart  3-9 Units Subcutaneous Q4H  . insulin detemir  12 Units Subcutaneous Q12H  . mouth rinse  15 mL Mouth Rinse 10 times per day  . polyethylene glycol  17 g Per Tube Daily  . potassium chloride  40 mEq Oral BID  . rosuvastatin  20 mg Per Tube Daily  . sertraline  50 mg Per Tube Daily  . sodium chloride flush  10 mL Intracatheter Q12H   Continuous Infusions: . sodium chloride 10 mL/hr (12/07/19 1833)  . ampicillin-sulbactam (UNASYN) IV Stopped (12/08/19 9326)  . dexmedetomidine (PRECEDEX) IV infusion 1.2 mcg/kg/hr (12/08/19 1000)  . feeding supplement (VITAL AF 1.2 CAL) 30 mL/hr at 12/08/19 0700  . fentaNYL infusion INTRAVENOUS 200 mcg/hr (12/08/19 1000)  . heparin 1,050 Units/hr (12/08/19 1000)  . norepinephrine (LEVOPHED) Adult infusion 2 mcg/min (12/08/19 1000)   PRN Meds: sodium chloride, acetaminophen, docusate, fentaNYL, labetalol, ondansetron (ZOFRAN) IV   Vital Signs    Vitals:   12/08/19 0500 12/08/19 0600 12/08/19 0700 12/08/19 0730  BP: (!) 110/58 (!) 106/50 (!) 121/58   Pulse: 77 77 76   Resp: (!) 30 (!) 5 13   Temp: 99.5 F (37.5 C) 99.1 F (37.3 C) 99.1 F (37.3 C)   TempSrc:      SpO2: 100% 99% 100% 99%  Weight: 86.8 kg     Height:        Intake/Output Summary (Last 24 hours) at 12/08/2019 1002 Last data filed at  12/08/2019 1000 Gross per 24 hour  Intake 1900.98 ml  Output 5220 ml  Net -3319.02 ml   Last 3 Weights 12/08/2019 12/07/2019 12/08/2019  Weight (lbs) 191 lb 5.8 oz 197 lb 15.6 oz 199 lb 15.3 oz  Weight (kg) 86.8 kg 89.8 kg 90.7 kg      Telemetry    Normal sinus rhythm without atrial fibrillation.- Personally Reviewed  ECG    No new tracing.- Personally Reviewed  Physical Exam  Obese African-American female GEN: Still on ventilator, trying to communicate. Neck:  Unable to assess JVD Cardiac:  Heart sounds are distant. Respiratory: Clear to auscultation bilaterally. GI: Soft, nontender, non-distended  MS: No edema; No deformity. Neuro:  Moving all extremities. Psych: Normal affect   Labs    High Sensitivity Troponin:   Recent Labs  Lab 11/17/2019 1530 11/20/2019 2044 12/06/19 1605 12/07/19 0928  TROPONINIHS 293* 8,932* 6,658* 2,702*      Chemistry Recent Labs  Lab 12/06/19 1605 12/06/19 1605 12/07/19 0304 12/07/19 0315 12/07/19 0928 12/07/19 1138 12/07/19 1506 12/08/19 0324 12/08/19 0335  NA 140   < > 140   < > 142   < > 142 142 144  K 3.8   < > 3.7   < > 3.7   < > 3.8 3.9 3.7  CL 107   < >  108  --  105  --   --  104  --   CO2 21*   < > 22  --  20*  --   --  27  --   GLUCOSE 219*   < > 153*  --  320*  --   --  94  --   BUN 13   < > 13  --  15  --   --  18  --   CREATININE 0.95   < > 0.90  --  1.20*  --   --  1.37*  --   CALCIUM 8.1*   < > 8.0*  --  8.2*  --   --  8.8*  --   PROT 6.0*  --  5.9*  --  6.2*  --   --   --   --   ALBUMIN 2.8*  --  2.7*  --  2.7*  --   --   --   --   AST 59*  --  56*  --  275*  --   --   --   --   ALT 24  --  24  --  214*  --   --   --   --   ALKPHOS 84  --  79  --  121  --   --   --   --   BILITOT 0.7  --  1.0  --  0.8  --   --   --   --   GFRNONAA >60   < > >60  --  47*  --   --  40*  --   GFRAA >60   < > >60  --  54*  --   --  46*  --   ANIONGAP 12   < > 10  --  17*  --   --  11  --    < > = values in this interval not  displayed.     Hematology Recent Labs  Lab 12/07/19 0304 12/07/19 0315 12/07/19 0928 12/07/19 1138 12/07/19 1506 12/08/19 0324 12/08/19 0335  WBC 11.6*  --  13.1*  --   --  14.6*  --   RBC 3.68*  --  4.05  --   --  3.30*  --   HGB 11.2*   < > 12.8   < > 11.2* 10.8* 9.9*  HCT 33.2*   < > 39.1   < > 33.0* 30.9* 29.0*  MCV 90.2  --  96.5  --   --  93.6  --   MCH 30.4  --  31.6  --   --  32.7  --   MCHC 33.7  --  32.7  --   --  35.0  --   RDW 12.7  --  12.9  --   --  12.9  --   PLT 157  --  PLATELET CLUMPS NOTED ON SMEAR, UNABLE TO ESTIMATE  --   --  153  --    < > = values in this interval not displayed.    BNP Recent Labs  Lab 12/07/19 0304 12/07/19 0928 12/08/19 0324  BNP 192.2* 209.1* 228.5*     DDimer No results for input(s): DDIMER in the last 168 hours.   Radiology    DG Chest 1 View  Result Date: 12/08/2019 CLINICAL DATA:  Intubation.  Respiratory failure. EXAM: CHEST  1 VIEW COMPARISON:  12/07/2019. FINDINGS: Interim placement of NG tube,  its tip is below left hemidiaphragm. Endotracheal tube, left IJ line in stable position. Heart size normal. Diffuse bilateral pulmonary infiltrates/edema with slight improvement from prior exam. No prominent pleural effusion. No pneumothorax. Prior cervical spine fusion. IMPRESSION: 1. Interim placement of NG tube, its tip is below left hemidiaphragm. Endotracheal tube and left IJ line in stable position. 2. Diffuse bilateral pulmonary infiltrates/edema with slight improvement from prior exam. 3.  Heart size normal. Electronically Signed   By: [place]   On: 12/08/2019 07:43   DG CHEST PORT 1 VIEW  Result Date: 12/07/2019 CLINICAL DATA:  Hypoxia.  Status post cardiac arrest EXAM: PORTABLE CHEST 1 VIEW COMPARISON:  December 07, 2019 study obtained earlier in the day FINDINGS: Endotracheal tube tip is 1.7 cm above the carina. Central catheter tip is in the superior vena cava. Nasogastric tube no longer present. No pneumothorax.  Extensive airspace opacity is present, essentially stable on the left and overall increased on the right, particular in the right perihilar region. Heart size is borderline prominent with pulmonary vascularity normal. There is aortic atherosclerosis. No adenopathy. No bone lesions. IMPRESSION: Nasogastric tube no longer present. Other tube and catheter positions unchanged. No pneumothorax appreciable. Widespread airspace opacity, stable on the left and increased on the right. Given clinical history, a degree of aspiration on the right must be of concern. Stable cardiac silhouette. Aortic Atherosclerosis (ICD10-I70.0). Electronically Signed   By: Lowella Grip III M.D.   On: 12/07/2019 09:56   DG Chest Port 1 View  Result Date: 12/07/2019 CLINICAL DATA:  Hypoxia EXAM: PORTABLE CHEST 1 VIEW COMPARISON:  December 06, 2019 chest radiograph and CT angiogram chest FINDINGS: Endotracheal tube tip is 3.3 cm above the carina. Nasogastric tube tip and side port are below the diaphragm. Central catheter tip is in the superior vena cava. No pneumothorax. There is patchy airspace opacity throughout the lungs bilaterally, essentially stable compared to 1 day prior. No new opacity evident. Heart is mildly enlarged with pulmonary vascularity normal. No adenopathy. Postoperative change noted in the lower cervical spine. IMPRESSION: Tube and catheter positions as described without pneumothorax. Airspace opacity bilaterally consistent with multifocal pneumonia, essentially stable compared to 1 day prior. No new opacity evident. Stable cardiac prominence. Electronically Signed   By: Lowella Grip III M.D.   On: 12/07/2019 08:07   DG Abd Portable 1V  Result Date: 12/07/2019 CLINICAL DATA:  Check feeding catheter placement EXAM: PORTABLE ABDOMEN - 1 VIEW COMPARISON:  None. FINDINGS: Gastric catheter is noted within the stomach. No obstructive changes are seen. No bony abnormality is noted. IMPRESSION: Gastric catheter  within the stomach. Electronically Signed   By: Inez Catalina M.D.   On: 12/07/2019 19:18    Cardiac Studies   Recent echo demonstrates EF 55%.  Patient Profile     69 y.o. female with a hx of cardiac arrest 2018, CAD S/P PCI Stent to RCA 2002 and distal LAD 0962, chronic diastolic heart failure, HTN, Hyperlipidemia, spinal stenosis, tobacco use and diabetis who is being seen today for the evaluation of NSTEMI.  Assessment & Plan    1. PEA cardiac arrest, uncertain etiology.  Not felt to be an ischemic event. 2. Non ST elevation myocardial infarction, exclude coronary occlusion and previously stented sites versus new acute coronary lesion.  Will need coronary angiography when off support and more clinically stable.  Probably Monday.  Will reassess in a.m. history of prior coronary stents involving the RCA and LAD. 3. Hypoxic respiratory failure requiring ventilation:  Still ventilated.  Attempts to wean are ongoing.Chronic diastolic heart failure, difficult to assess current volume status. 4. New left bundle branch block  If she gets extubated over the weekend, and seems stable, we recommend coronary angiography early next week.  For questions or updates, please contact [place] Please consult [email or web address] for contact info under        Signed, Sinclair Grooms, MD  12/08/2019, 10:02 AM

## 2019-12-08 NOTE — Progress Notes (Signed)
FENTANYL 222ML Wasted from bag / unaccounted ml wasted dripped from bag to floor (wasted).    After spiking bag and hanging, was still in room charting, turned to double check lines/IVs/gtts and noted lines were wet.  Fentanyl was leaking from spike/port in bag.  Changed line, chamber still leaking-  Left room to get pharmacist.  Richardine Service - pharmacist to room, witnessed. Pulled down bag and wasted with Christine at side, cleaned pool of fentanyl from floor and wasted remainder in appropriate bin.    Marlis Edelson, RN

## 2019-12-08 NOTE — Progress Notes (Signed)
NAME:  [name] Bean, MRN:  [id number], DOB:  1951/01/04, LOS: 3 ADMISSION DATE:  12/03/2019, CONSULTATION DATE:  11/18/2019 REFERRING MD:  Ralene Bathe - EM , CHIEF COMPLAINT:  Respiratory arrest, hypoxemia   Brief History   69yo female presented to ED after PEA cardiac arrest likely due to respiratory failure.  History of present illness   [name] Bean is a 69 y.o. with PHX significant for HTN, HLD, DM, CHF, cardiac arrest, CAD, and anxiety who presented to ED s/p cardiac arrest. Per report patient called EMS with acute complaints of SOB and on arrival EMS found patient unresponsive in PEA arrest. EMS provided 3 rounds of CPR and obtained ROSC prior to ED arrival. No ACLS medications were given. PCCM called urgently to evaluate pt in ED due to hypoxemia.   CXR with bilateral ASD, concerning for possible ARDS. ABG 7.2/52/48 Labs acquired in ED are hemolyzed, resent but unavailable for review   Past Medical History   Diastolic heart failure HTN DM Prior PEA arrest CAD   Significant Hospital Events   8/24 BIB Ems for CC SOB. On EMS arrival, pulseless and hypoxemic. ROSC achieved. PCCM consulted for hypoxemia shortly after intubation. CVC and Aline placed by PCCM in ED 8/25 [facility] w/ much improved aeration. Able to start weaning PEEP/FIO2 down to 10 from 14 and FIO2 down from 100% to 70%. Transitioning OFF insulin gtt. Transitioning from Fent/propofol to precedex.  8/26: Seen by cardiology on the 25th for non-ST elevation MI.  Started on IV heparin and aspirin on the 25th.  Past spontaneous breathing trial on the morning of 8/26 and was extubated.  Did have some fairly significant hypertension post extubation-->progressed to rapid resp failure and subsequent PEA arrest. Re intubated. Time to ROSC 9 minutes.  8/27: More awake.  Weaning PEEP and FiO2.  Continuing to diurese and maximize hemodynamics not ready for extubation Consults:    Procedures:  8/24 ETT>8/26>>> 8/24  CVC> 8/24 Aline>  Significant Diagnostic Tests:  8/24 CXR> Diffuse bilateral pulmonary opacities. ETT 2.5cm above carina.  8/24 CTA chest>>1. No CT evidence of pulmonary embolism.2. Small bilateral pleural effusions with findings of pulmonary edema or ARDS.3. Clusters of airspace consolidation primarily involving the left lower lobe as well as upper lobes may represent pneumonia. Clinical correlation recommended.4. Aortic Atherosclerosis (ICD10-I70.0). 8/24 CT H >> negative  ECHO 8/24: Left Ventricle: Endocardial border definition is poor but systolic  function appears to be grossly normal. Cannot rule out mild septal  hypokinesis. Left ventricular ejection fraction, by estimation, is 55 to 60%. The left ventricle has normal function. The left ventricle has no regional wall motion abnormalities. Definity contrast agent was given IV to delineate the left ventricular endocardial borders. The left ventricular internal cavity size was normal in size. There is no left ventricular hypertrophy. Left ventricular diastolic parameters are indeterminate.  Micro Data:  8/24 SARS Cov2> neg 8/24 RVP >> 8/24 Tracheal aspirate >> 8/24 BCx>>   Antimicrobials:  Unasyn 8/24 >>>  Interim history/subjective:  Seems comfortable today  Objective   Blood pressure (Abnormal) 121/58, pulse 76, temperature 99.1 F (37.3 C), resp. rate 13, height 5\' 2"  (1.575 m), weight 86.8 kg, SpO2 99 %. CVP:  [0 mmHg-51 mmHg] 10 mmHg  Vent Mode: PRVC FiO2 (%):  [60 %-100 %] 60 % Set Rate:  [24 bmp-30 bmp] 30 bmp Vt Set:  [300 mL-400 mL] 300 mL PEEP:  [10 cmH20-16 cmH20] 10 cmH20 Plateau Pressure:  [22 cmH20-32 cmH20] 24 cmH20  Intake/Output Summary (Last 24 hours) at 12/08/2019 0855 Last data filed at 12/08/2019 0700 Gross per 24 hour  Intake 1750.52 ml  Output 4970 ml  Net -3219.48 ml   Filed Weights   11/30/2019 2000 12/07/19 0500 12/08/19 0500  Weight: 90.7 kg 89.8 kg 86.8 kg    Examination:  General this is a  69 year old black female she is currently resting on pressure support ventilation and in no acute distress HEENT normocephalic atraumatic no jugular venous distention mucous membranes are moist orally intubated Pulmonary: Scattered diffuse rhonchi, no accessory use currently.  Currently on PEEP 10/FiO2 50. Cardiac regular rate rhythm no murmur rub or gallop Abdomen soft nontender no organomegaly tolerating tube feeds Extremities are warm and dry with trace lower extremity edema pulses are strong Neuro awake oriented no focal deficits appreciated she is a little bit restless GU clear yellow urine  Resolved Hospital Problem list   PEA cardiac arrest: Felt to be secondary to respiratory failure with acute pulmonary edema  lactic acidosis status post cardiac arrest Circulatory shock: Status post cardiac arrest felt to be medication related with possible cardiogenic component plus/minus sepsis: Resolved as of 8/26 Recurrent PEA arrest (second arrest this admit) w/ subsequent cardiogenic shock  2/2 flash Pulmonary edema and subsequent acute respiratory failure Assessment & Plan:    Acute diastolic HF w/ Pulmonary edema, with non-ST elevation MI -had graduated from Dr. B HF clinic, April 2021 -- EF 60%; still 55-60% w/ possible mild septal hypokinesis; RV fxn nml, RV size NML,  Hx HTN Plan Continue telemetry monitoring  Continue IV heparin  Continue Crestor and aspirin  Holding her Coreg, Demadex, Cozaar and hydralazine for now as she is actually pressor dependent as a result of sedation medications  She will need ischemia evaluation at some point Continue to trend BNP  Recurrent Acute respiratory failure with hypoxemia in the setting of diffuse pulmonary edema -Cannot exclude element of aspiration Portable chest x-ray personally reviewed Endotracheal tube is in satisfactory position the nasogastric tube is below the level of the diaphragm there is bilateral airspace disease, however this  looks improved when comparing film from 8/26 Plan Continuing full ventilator support, okay to cycle on pressure support later today after PEEP down to 8 Will not extubate today Continue to push diuretics Continue VAP bundle PAD protocol, RASS goal -1 When we get to a point where we are ready to reassess for extubation would extubate her on Precedex, also have Cleviprex ready, as became rapidly hypertensive which precipitated to her most recent respiratory failure Also would extubate to BiPAP Repeat chest x-ray in a.m.  Fluid and electrolyte imbalance: Intermittent Plan we will continue to replace and recheck as indicated  Mild AKI Current fluid balance is -3 L Plan Continuing diuretics Repeat chemistry this afternoon, may need to back off a little bit if creatinine continues to climb  DM w/ hyperglycemia  Plan Continue sliding scale insulin with current Levemir dosing Blood glucose goal 140-180.  Best practice:  Diet: EN  Pain/Anxiety/Delirium protocol (if indicated): Fent, prop VAP protocol (if indicated): Yes  DVT prophylaxis: SQ Heparin GI prophylaxis: PPI  Glucose control: SSI  Mobility: BR-->PT consult Code Status: Full  Family Communication: sister updated 8/26 after arrest Disposition: ICU  My critical care time 35 minutes.   Erick Colace ACNP-BC [place] Pager # [phone] OR # [phone] if no answer

## 2019-12-08 NOTE — Progress Notes (Signed)
Chaplain responded to page from patient's nurse requesting Chaplain visit with family member who has just arrived after a 9-hour drive and received update on her sister.  Chaplain established a relationship of care and concern for both patient and sister, acknowledging how strong the sister-bond is.  After inquiring if the family has a spiritual tradition and learning they are both [place], Clinical biochemist offered prayer at bedside.  Chaplain invited sister to reach out to Spiritual Care at any time for follow up care.  [facility]

## 2019-12-08 NOTE — Progress Notes (Signed)
ANTICOAGULATION CONSULT NOTE  Pharmacy Consult for heparin Indication: chest pain/ACS  Patient Measurements: Height: 5\' 2"  (157.5 cm) Weight: 86.8 kg (191 lb 5.8 oz) IBW/kg (Calculated) : 50.1 Heparin Dosing Weight: 72.4 kg  Vital Signs: Temp: 99.1 F (37.3 C) (08/27 0700) BP: 121/58 (08/27 0700) Pulse Rate: 76 (08/27 0700)  Labs: Recent Labs    11/27/2019 1422 11/17/2019 1455 12/01/2019 2044 11/28/2019 2231 12/06/19 1605 12/06/19 2206 12/07/19 0304 12/07/19 0315 12/07/19 0928 12/07/19 0928 12/07/19 1138 12/07/19 1506 12/07/19 1506 12/07/19 1706 12/08/19 0000 12/08/19 0324 12/08/19 0335  HGB 14.9   < >  --    < >  --   --  11.2*   < > 12.8  --    < > 11.2*   < >  --   --  10.8* 9.9*  HCT 46.4*   < >  --    < >  --   --  33.2*   < > 39.1  --    < > 33.0*  --   --   --  30.9* 29.0*  PLT 272  --   --    < >  --   --  157  --  PLATELET CLUMPS NOTED ON SMEAR, UNABLE TO ESTIMATE  --   --   --   --   --   --  153  --   APTT  --   --   --   --   --   --   --   --  51*  --   --   --   --   --   --   --   --   LABPROT 14.1  --   --   --   --   --   --   --  17.0*  --   --   --   --   --   --   --   --   INR 1.1  --   --   --   --   --   --   --  1.4*  --   --   --   --   --   --   --   --   HEPARINUNFRC  --   --   --   --   --    < > <0.10*   < > <0.10*   < >  --   --   --  0.31 0.33 0.43  --   CREATININE  --    < >  --    < > 0.95  --  0.90  --  1.20*  --   --   --   --   --   --  1.37*  --   TROPONINIHS  --    < > 8,932*  --  1,610*  --   --   --  2,702*  --   --   --   --   --   --   --   --    < > = values in this interval not displayed.    Estimated Creatinine Clearance: 40.8 mL/min (A) (by C-G formula based on SCr of 1.37 mg/dL (H)).  Assessment: 69 YO female presenting s/p PEA arrest. Patient called EMS with acute complaints of shortness of breath and was found unresponsive in PEA arrest.  EMS provided 3 rounds of CPR and obtained ROSC prior to ED arrival.  Patient does have  a history of PEA arrest in 2018.  No anticoagulation PTA.  Pharmacy was consulted to start heparin to rule out ACS.  Heparin level remains therapeutic (0.43) on gtt at 1050 units/hr. No bleeding noted.  Goal of Therapy:  Heparin level 0.3-0.7 units/ml Monitor platelets by anticoagulation protocol: Yes   Plan:  - Continue heparin 1050 units/hr (10.5 ml/hr) - F/u daily heparin level  [place] Crane, Vena Austria, BCPS, BCCP Clinical Pharmacist  12/08/2019 8:22 AM   [facility] pharmacy phone numbers are listed on [email or web address]

## 2019-12-09 LAB — BASIC METABOLIC PANEL
Anion gap: 9 (ref 5–15)
Anion gap: 11 (ref 5–15)
BUN: 36 mg/dL — ABNORMAL HIGH (ref 8–23)
BUN: 35 mg/dL — ABNORMAL HIGH (ref 8–23)
CO2: 29 mmol/L (ref 22–32)
CO2: 27 mmol/L (ref 22–32)
Calcium: 8.8 mg/dL — ABNORMAL LOW (ref 8.9–10.3)
Calcium: 8.7 mg/dL — ABNORMAL LOW (ref 8.9–10.3)
Chloride: 106 mmol/L (ref 98–111)
Chloride: 107 mmol/L (ref 98–111)
Creatinine, Ser: 1.38 mg/dL — ABNORMAL HIGH (ref 0.44–1.00)
Creatinine, Ser: 1.2 mg/dL — ABNORMAL HIGH (ref 0.44–1.00)
GFR calc Af Amer: 46 mL/min — ABNORMAL LOW (ref >60)
GFR calc Af Amer: 54 mL/min — ABNORMAL LOW (ref >60)
GFR calc non Af Amer: 39 mL/min — ABNORMAL LOW (ref >60)
GFR calc non Af Amer: 47 mL/min — ABNORMAL LOW (ref >60)
Glucose, Bld: 171 mg/dL — ABNORMAL HIGH (ref 70–99)
Glucose, Bld: 255 mg/dL — ABNORMAL HIGH (ref 70–99)
Potassium: 4.3 mmol/L (ref 3.5–5.1)
Potassium: 4.1 mmol/L (ref 3.5–5.1)
Sodium: 144 mmol/L (ref 135–145)
Sodium: 145 mmol/L (ref 135–145)

## 2019-12-09 LAB — GLUCOSE, CAPILLARY
Glucose-Capillary: 180 mg/dL — ABNORMAL HIGH (ref 70–99)
Glucose-Capillary: 180 mg/dL — ABNORMAL HIGH (ref 70–99)
Glucose-Capillary: 190 mg/dL — ABNORMAL HIGH (ref 70–99)
Glucose-Capillary: 199 mg/dL — ABNORMAL HIGH (ref 70–99)
Glucose-Capillary: 214 mg/dL — ABNORMAL HIGH (ref 70–99)
Glucose-Capillary: 216 mg/dL — ABNORMAL HIGH (ref 70–99)
Glucose-Capillary: 240 mg/dL — ABNORMAL HIGH (ref 70–99)

## 2019-12-09 LAB — BRAIN NATRIURETIC PEPTIDE: B Natriuretic Peptide: 136.4 pg/mL — ABNORMAL HIGH (ref 0.0–100.0)

## 2019-12-09 LAB — CBC
HCT: 28.9 % — ABNORMAL LOW (ref 36.0–46.0)
Hemoglobin: 9.1 g/dL — ABNORMAL LOW (ref 12.0–15.0)
MCH: 30.2 pg (ref 26.0–34.0)
MCHC: 31.5 g/dL (ref 30.0–36.0)
MCV: 96 fL (ref 80.0–100.0)
Platelets: 142 10*3/uL — ABNORMAL LOW (ref 150–400)
RBC: 3.01 MIL/uL — ABNORMAL LOW (ref 3.87–5.11)
RDW: 12.8 % (ref 11.5–15.5)
WBC: 11.2 10*3/uL — ABNORMAL HIGH (ref 4.0–10.5)
nRBC: 0 % (ref 0.0–0.2)

## 2019-12-09 LAB — HEPARIN LEVEL (UNFRACTIONATED): Heparin Unfractionated: 0.34 IU/mL (ref 0.30–0.70)

## 2019-12-09 NOTE — Progress Notes (Signed)
[place] Progress Note Patient Name: [name] Bean DOB: 08-May-1950 MRN: [id number]   Date of Service  12/09/2019  HPI/Events of Note  Patient with watery stools, bedside RN requesting Flexiseal.  eICU Interventions  Flexiseal ordered.        Kerry Kass Jebediah Macrae 12/09/2019, 11:31 PM

## 2019-12-09 NOTE — Progress Notes (Signed)
[place] Progress Note Patient Name: [name] Bean DOB: 04-21-1950 MRN: [id number]   Date of Service  12/09/2019  HPI/Events of Note  Patient needs a cooling blanket.  eICU Interventions  Cooling blanket ordered.        Kerry Kass Esme Durkin 12/09/2019, 3:16 AM

## 2019-12-09 NOTE — Progress Notes (Signed)
NAME:  [name] Bean, MRN:  [id number], DOB:  [birth year], LOS: 4 ADMISSION DATE:  12/07/2019, CONSULTATION DATE:  12/07/2019 REFERRING MD:  Ralene Bathe - EM , CHIEF COMPLAINT:  Respiratory arrest, hypoxemia   Brief History   69yo female presented to ED after PEA cardiac arrest likely due to respiratory failure.  History of present illness   [name] Bean is a 69 y.o. with PHX significant for HTN, HLD, DM, CHF, cardiac arrest, CAD, and anxiety who presented to ED s/p cardiac arrest. Per report patient called EMS with acute complaints of SOB and on arrival EMS found patient unresponsive in PEA arrest. EMS provided 3 rounds of CPR and obtained ROSC prior to ED arrival. No ACLS medications were given. PCCM called urgently to evaluate pt in ED due to hypoxemia.   CXR with bilateral ASD, concerning for possible ARDS. ABG 7.2/52/48 Labs acquired in ED are hemolyzed, resent but unavailable for review   Past Medical History   Diastolic heart failure HTN DM Prior PEA arrest CAD   Significant Hospital Events   8/24 BIB [facility] for CC SOB. On EMS arrival, pulseless and hypoxemic. ROSC achieved. PCCM consulted for hypoxemia shortly after intubation. CVC and Aline placed by PCCM in ED 8/25 [facility] w/ much improved aeration. Able to start weaning PEEP/FIO2 down to 10 from 14 and FIO2 down from 100% to 70%. Transitioning OFF insulin gtt. Transitioning from Fent/propofol to precedex.  8/26: Seen by cardiology on the 25th for non-ST elevation MI.  Started on IV heparin and aspirin on the 25th.  Past spontaneous breathing trial on the morning of 8/26 and was extubated.  Did have some fairly significant hypertension post extubation-->progressed to rapid resp failure and subsequent PEA arrest. Re intubated. Time to ROSC 9 minutes.  8/27: More awake.  Weaning PEEP and FiO2.  Continuing to diurese and maximize hemodynamics not ready for extubation Consults:    Procedures:  8/24 ETT>8/26>>> 8/24  CVC> 8/24 Aline>  Significant Diagnostic Tests:  8/24 CXR> Diffuse bilateral pulmonary opacities. ETT 2.5cm above carina.  8/24 CTA chest>>1. No CT evidence of pulmonary embolism.2. Small bilateral pleural effusions with findings of pulmonary edema or ARDS.3. Clusters of airspace consolidation primarily involving the left lower lobe as well as upper lobes may represent pneumonia. Clinical correlation recommended.4. Aortic Atherosclerosis (ICD10-I70.0). 8/24 CT H >> negative  ECHO 8/24: Left Ventricle: Endocardial border definition is poor but systolic  function appears to be grossly normal. Cannot rule out mild septal  hypokinesis. Left ventricular ejection fraction, by estimation, is 55 to 60%. The left ventricle has normal function. The left ventricle has no regional wall motion abnormalities. Definity contrast agent was given IV to delineate the left ventricular endocardial borders. The left ventricular internal cavity size was normal in size. There is no left ventricular hypertrophy. Left ventricular diastolic parameters are indeterminate.  Micro Data:  8/24 SARS Cov2> neg 8/24 RVP >> 8/24 Tracheal aspirate >> 8/24 BCx>>   Antimicrobials:  Unasyn 8/24 >>>  Interim history/subjective:   Febrile overnight 102.46F Comfortable Currently on norepinephrine 1, fentanyl 150 PRVC PEEP of 10 Communicating by writing on a pad   Objective   Blood pressure (!) 124/52, pulse 70, temperature 98.4 F (36.9 C), temperature source Axillary, resp. rate (!) 30, height 5\' 2"  (1.575 m), weight 86.9 kg, SpO2 99 %. CVP:  [1 mmHg-13 mmHg] 13 mmHg  Vent Mode: PRVC FiO2 (%):  [40 %-50 %] 40 % Set Rate:  [30 bmp] 30 bmp Vt Set:  [  300 mL] 300 mL PEEP:  [8 cmH20-10 cmH20] 8 cmH20 Plateau Pressure:  [24 cmH20-25 cmH20] 25 cmH20   Intake/Output Summary (Last 24 hours) at 12/09/2019 9518 Last data filed at 12/09/2019 0700 Gross per 24 hour  Intake 2462.33 ml  Output 2470 ml  Net -7.67 ml   Filed  Weights   12/07/19 0500 12/08/19 0500 12/09/19 0500  Weight: 89.8 kg 86.8 kg 86.9 kg    Examination:  General this is a 69 year old black female comfortable in bed on PRVC HEENT ET tube in place, no oral lesions, pupils equal Pulmonary: Bilateral inspiratory crackles, no wheezing, 0.40, PEEP 10 Cardiac regular, distant, no murmur Abdomen nondistended, positive bowel sounds, tube feeding running Extremities trace lower extremity edema Neuro awake, alert, follows commands, interacting, good strength  Resolved Hospital Problem list   PEA cardiac arrest: Felt to be secondary to respiratory failure with acute pulmonary edema  lactic acidosis status post cardiac arrest Circulatory shock: Status post cardiac arrest felt to be medication related with possible cardiogenic component plus/minus sepsis: Resolved as of 8/26 Recurrent PEA arrest (second arrest this admit) w/ subsequent cardiogenic shock  2/2 flash Pulmonary edema and subsequent acute respiratory failure Assessment & Plan:    Acute diastolic HF w/ Pulmonary edema, with non-ST elevation MI -had graduated from Dr. B HF clinic, April 2021 -- EF 60%; still 55-60% w/ possible mild septal hypokinesis; RV fxn nml, RV size NML,  Hx HTN Shock, suspect due to requirement for sedating medications but fever overnight 8/27 so consider evolving sepsis Plan IV heparin as ordered Crestor, aspirin Carvedilol, Demadex, Cozaar, hydralazine all currently on hold given her pressor need Appreciate cardiology evaluation and management On empiric Unasyn for possible aspiration, blood cultures pending, question whether respiratory culture was ever sent.  Will resend today 8/28  Recurrent Acute respiratory failure with hypoxemia in the setting of diffuse pulmonary edema Question possible aspiration pneumonia Plan Wean PEEP and FiO2 as able, goal PEEP of 8 8/28 Will need tight blood pressure control and optimize volume status before we attempt  extubation.  May require Precedex or even Cleviprex to facilitate as she decompensated due to agitation and associated hypertension.  Would likely extubate straight to BiPAP Follow chest x-ray Antibiotics as above  Fluid and electrolyte imbalance: Intermittent Plan Follow BMP Replace electrolytes as indicated  Mild AKI Current fluid balance is -3.2 L total Some improvement in serum creatinine 8/28 Plan Diuretics currently on hold.  Hold another day and likely restart on 8/29 Follow BMP and urine output  DM w/ hyperglycemia  Plan Continue sliding scale insulin with current Levemir dosing Blood glucose goal 140-180.  Best practice:  Diet: EN  Pain/Anxiety/Delirium protocol (if indicated): Fent, Precedex VAP protocol (if indicated): Yes  DVT prophylaxis: SQ Heparin GI prophylaxis: PPI  Glucose control: SSI  Mobility: BR-->PT consult Code Status: Full  Family Communication: sister updated 8/26 after arrest Disposition: ICU  My critical care time 33 minutes.    Baltazar Apo, MD, PhD 12/09/2019, 9:37 AM  Pulmonary and Critical Care [phone] or if no answer [phone]

## 2019-12-09 NOTE — Progress Notes (Signed)
Progress Note  Patient Name: [name] Bean Date of Encounter: 12/09/2019  Advanced Heart Failure Cardiologist: Linna Hoff Bensimhon  Subjective   Patient is awake on the ventilator.  Inpatient Medications    Scheduled Meds: . aspirin  81 mg Per Tube Daily  . chlorhexidine gluconate (MEDLINE KIT)  15 mL Mouth Rinse BID  . Chlorhexidine Gluconate Cloth  6 each Topical Daily  . docusate  100 mg Per Tube BID  . feeding supplement (PROSource TF)  45 mL Per Tube BID  . fentaNYL (SUBLIMAZE) injection  25 mcg Intravenous Once  . insulin aspart  3-9 Units Subcutaneous Q4H  . insulin detemir  12 Units Subcutaneous Q12H  . mouth rinse  15 mL Mouth Rinse 10 times per day  . polyethylene glycol  17 g Per Tube Daily  . potassium chloride  40 mEq Oral BID  . rosuvastatin  20 mg Per Tube Daily  . sertraline  50 mg Per Tube Daily  . sodium chloride flush  10 mL Intracatheter Q12H   Continuous Infusions: . sodium chloride 10 mL/hr (12/07/19 1833)  . ampicillin-sulbactam (UNASYN) IV Stopped (12/09/19 0601)  . dexmedetomidine (PRECEDEX) IV infusion Stopped (12/09/19 1740)  . feeding supplement (VITAL AF 1.2 CAL) 45 mL/hr at 12/09/19 0700  . fentaNYL infusion INTRAVENOUS 150 mcg/hr (12/09/19 0700)  . heparin 1,050 Units/hr (12/09/19 0700)  . norepinephrine (LEVOPHED) Adult infusion 1 mcg/min (12/09/19 0700)   PRN Meds: sodium chloride, acetaminophen, docusate, fentaNYL, labetalol, ondansetron (ZOFRAN) IV   Vital Signs    Vitals:   12/09/19 0630 12/09/19 0700 12/09/19 0747 12/09/19 0749  BP: (!) 130/33 (!) 124/52    Pulse: 61 69 70   Resp: 17 20 (!) 30   Temp:    98.4 F (36.9 C)  TempSrc:    Axillary  SpO2: 99% 96% 99%   Weight:      Height:        Intake/Output Summary (Last 24 hours) at 12/09/2019 0820 Last data filed at 12/09/2019 0700 Gross per 24 hour  Intake 2535.96 ml  Output 2920 ml  Net -384.04 ml   Filed Weights   12/07/19 0500 12/08/19 0500 12/09/19 0500    Weight: 89.8 kg 86.8 kg 86.9 kg    Telemetry    Sinus rhythm.  Personally reviewed.  ECG    An ECG dated 12/07/2019 was personally reviewed today and demonstrated:  Sinus rhythm with left bundle block.  Physical Exam   GEN:  Patient awake on ventilator.   Neck: No JVD. Cardiac:  Distant regular heart sounds.  Respiratory: Nonlabored. Clear to auscultation bilaterally. GI: Soft, nontender, bowel sounds present. MS: No edema; No deformity.  Labs    Chemistry Recent Labs  Lab 12/06/19 1605 12/06/19 1605 12/07/19 0304 12/07/19 0315 12/07/19 8144 12/07/19 1138 12/08/19 0324 12/08/19 0324 12/08/19 0335 12/08/19 1655 12/09/19 0521  NA 140   < > 140   < > 142   < > 142   < > 144 143 144  K 3.8   < > 3.7   < > 3.7   < > 3.9   < > 3.7 4.0 4.3  CL 107   < > 108   < > 105   < > 104  --   --  101 106  CO2 21*   < > 22   < > 20*   < > 27  --   --  29 29  GLUCOSE 219*   < > 153*   < >  320*   < > 94  --   --  195* 171*  BUN 13   < > 13   < > 15   < > 18  --   --  25* 36*  CREATININE 0.95   < > 0.90   < > 1.20*   < > 1.37*  --   --  1.57* 1.38*  CALCIUM 8.1*   < > 8.0*   < > 8.2*   < > 8.8*  --   --  8.6* 8.8*  PROT 6.0*  --  5.9*  --  6.2*  --   --   --   --   --   --   ALBUMIN 2.8*  --  2.7*  --  2.7*  --   --   --   --   --   --   AST 59*  --  56*  --  275*  --   --   --   --   --   --   ALT 24  --  24  --  214*  --   --   --   --   --   --   ALKPHOS 84  --  79  --  121  --   --   --   --   --   --   BILITOT 0.7  --  1.0  --  0.8  --   --   --   --   --   --   GFRNONAA >60   < > >60   < > 47*   < > 40*  --   --  34* 39*  GFRAA >60   < > >60   < > 54*   < > 46*  --   --  39* 46*  ANIONGAP 12   < > 10   < > 17*   < > 11  --   --  13 9   < > = values in this interval not displayed.     Hematology Recent Labs  Lab 12/07/19 6295 12/07/19 1138 12/08/19 0324 12/08/19 0335 12/09/19 0521  WBC 13.1*  --  14.6*  --  11.2*  RBC 4.05  --  3.30*  --  3.01*  HGB 12.8   < > 10.8*  9.9* 9.1*  HCT 39.1   < > 30.9* 29.0* 28.9*  MCV 96.5  --  93.6  --  96.0  MCH 31.6  --  32.7  --  30.2  MCHC 32.7  --  35.0  --  31.5  RDW 12.9  --  12.9  --  12.8  PLT PLATELET CLUMPS NOTED ON SMEAR, UNABLE TO ESTIMATE  --  153  --  142*   < > = values in this interval not displayed.    Cardiac Enzymes Recent Labs  Lab 11/21/2019 1530 11/23/2019 2044 12/06/19 1605 12/07/19 0928  TROPONINIHS 293* 8,932* 6,658* 2,702*    BNP Recent Labs  Lab 12/07/19 0928 12/08/19 0324 12/09/19 0521  BNP 209.1* 228.5* 136.4*     Radiology    DG Chest 1 View  Result Date: 12/08/2019 CLINICAL DATA:  Intubation.  Respiratory failure. EXAM: CHEST  1 VIEW COMPARISON:  12/07/2019. FINDINGS: Interim placement of NG tube, its tip is below left hemidiaphragm. Endotracheal tube, left IJ line in stable position. Heart size normal. Diffuse bilateral pulmonary infiltrates/edema with slight improvement from prior exam. No prominent pleural effusion.  No pneumothorax. Prior cervical spine fusion. IMPRESSION: 1. Interim placement of NG tube, its tip is below left hemidiaphragm. Endotracheal tube and left IJ line in stable position. 2. Diffuse bilateral pulmonary infiltrates/edema with slight improvement from prior exam. 3.  Heart size normal. Electronically Signed   By: Hood   On: 12/08/2019 07:43   DG CHEST PORT 1 VIEW  Result Date: 12/07/2019 CLINICAL DATA:  Hypoxia.  Status post cardiac arrest EXAM: PORTABLE CHEST 1 VIEW COMPARISON:  December 07, 2019 study obtained earlier in the day FINDINGS: Endotracheal tube tip is 1.7 cm above the carina. Central catheter tip is in the superior vena cava. Nasogastric tube no longer present. No pneumothorax. Extensive airspace opacity is present, essentially stable on the left and overall increased on the right, particular in the right perihilar region. Heart size is borderline prominent with pulmonary vascularity normal. There is aortic atherosclerosis. No  adenopathy. No bone lesions. IMPRESSION: Nasogastric tube no longer present. Other tube and catheter positions unchanged. No pneumothorax appreciable. Widespread airspace opacity, stable on the left and increased on the right. Given clinical history, a degree of aspiration on the right must be of concern. Stable cardiac silhouette. Aortic Atherosclerosis (ICD10-I70.0). Electronically Signed   By: Lowella Grip III M.D.   On: 12/07/2019 09:56   DG Abd Portable 1V  Result Date: 12/07/2019 CLINICAL DATA:  Check feeding catheter placement EXAM: PORTABLE ABDOMEN - 1 VIEW COMPARISON:  None. FINDINGS: Gastric catheter is noted within the stomach. No obstructive changes are seen. No bony abnormality is noted. IMPRESSION: Gastric catheter within the stomach. Electronically Signed   By: Inez Catalina M.D.   On: 12/07/2019 19:18    Cardiac Studies   Echocardiogram 11/25/2019: 1. Endocardial border definition is poor but systolic function appears to  be grossly normal. Cannot rule out mild septal hypokinesis. Left  ventricular ejection fraction, by estimation, is 55 to 60%. The left  ventricle has normal function. The left  ventricle has no regional wall motion abnormalities. Left ventricular  diastolic parameters are indeterminate.  2. Right ventricular systolic function is normal. The right ventricular  size is normal. There is mildly elevated pulmonary artery systolic  pressure.  3. The mitral valve is normal in structure. No evidence of mitral valve  regurgitation. No evidence of mitral stenosis.  4. The aortic valve is normal in structure. Aortic valve regurgitation is  not visualized. No aortic stenosis is present.  5. The inferior vena cava is normal in size with <50% respiratory  variability, suggesting right atrial pressure of 8 mmHg.   Patient Profile     69 y.o. female with a history of cardiac arrest 2018 secondary to pulmonary edema, CAD s/p stentto RCA 2002 and distal LAD 2015,  chronic diastolic heart failure, HTN, Hyperlipidemia, spinal stenosis, tobacco use anddiabetiswho is being seen for the evaluation ofNSTEMI.  Assessment & Plan    1.  NSTEMI, peak high-sensitivity troponin I of 6658.  New left bundle branch block by ECG.  Historical information limited with patient on ventilator however hoping to extubate soon.  Echocardiogram reveals LVEF 55 to 60% range, possibly mild septal hypokinesis, normal RV contraction.  2.  Status post PEA arrest, possibly secondary to pulmonary edema given similar history in the past.  Reintubated after initial extubation attempt earlier in hospital stay and making progress with CCM management.  Hemodynamically stable and on minimal dose of Levophed.  3.  Fevers, temperature spike up to 102.8 overnight.  Has used cooling blanket.  Possibility of aspiration pneumonia versus pneumonitis.  She is on empiric antibiotics as well.  Continue supportive measures, anticipate wean to extubation per CCM.  From a cardiac perspective would continue aspirin and Crestor.  Once extubated and off Levophed completely might consider initiating low-dose beta-blocker.  Ultimately, diagnostic cardiac catheterization will be pursued, however need to clarify course and etiology of fevers first.  Signed, Rozann Lesches, MD  12/09/2019, 8:20 AM

## 2019-12-09 NOTE — Progress Notes (Signed)
ANTICOAGULATION CONSULT NOTE  Pharmacy Consult for heparin Indication: chest pain/ACS  Patient Measurements: Height: 5\' 2"  (157.5 cm) Weight: 86.9 kg (191 lb 9.3 oz) IBW/kg (Calculated) : 50.1 Heparin Dosing Weight: 72.4 kg  Vital Signs: Temp: 98.4 F (36.9 C) (08/28 0749) Temp Source: Axillary (08/28 0749) BP: 124/52 (08/28 0700) Pulse Rate: 70 (08/28 0747)  Labs: Recent Labs    12/06/19 1605 12/06/19 2206 12/07/19 0928 12/07/19 1138 12/07/19 1506 12/08/19 0000 12/08/19 0324 12/08/19 0324 12/08/19 0335 12/08/19 1655 12/09/19 0521  HGB  --    < > 12.8   < >   < >  --  10.8*   < > 9.9*  --  9.1*  HCT  --    < > 39.1   < >   < >  --  30.9*  --  29.0*  --  28.9*  PLT  --    < > PLATELET CLUMPS NOTED ON SMEAR, UNABLE TO ESTIMATE  --   --   --  153  --   --   --  142*  APTT  --   --  51*  --   --   --   --   --   --   --   --   LABPROT  --   --  17.0*  --   --   --   --   --   --   --   --   INR  --   --  1.4*  --   --   --   --   --   --   --   --   HEPARINUNFRC  --    < > <0.10*   < >  --  0.33 0.43  --   --   --  0.34  CREATININE 0.95   < > 1.20*   < >  --   --  1.37*  --   --  1.57* 1.38*  TROPONINIHS 5,993*  --  2,702*  --   --   --   --   --   --   --   --    < > = values in this interval not displayed.    Estimated Creatinine Clearance: 40.5 mL/min (A) (by C-G formula based on SCr of 1.38 mg/dL (H)).  Assessment: 69 YO female presenting s/p PEA arrest. Patient called EMS with acute complaints of shortness of breath and was found unresponsive in PEA arrest.  EMS provided 3 rounds of CPR and obtained ROSC prior to ED arrival.  Patient does have a history of PEA arrest in 2018.  No anticoagulation PTA.  Pharmacy was consulted to start heparin to rule out ACS.  Heparin level remains therapeutic (0.34) on gtt at 1050 units/hr. No bleeding noted.  Hgb trending down since admission, Pltc ok  Goal of Therapy:  Heparin level 0.3-0.7 units/ml Monitor platelets by  anticoagulation protocol: Yes   Plan:  - Continue heparin 1050 units/hr (10.5 ml/hr) - F/u daily heparin level, CBC- F/u plans for cath lab eventually.  [place] Crane, Roylene Reason, BCCP Clinical Pharmacist  12/09/2019 10:20 AM   [facility] pharmacy phone numbers are listed on [email or web address]

## 2019-12-09 NOTE — Evaluation (Signed)
Physical Therapy Evaluation Patient Details Name: [name] Bean MRN: [id number] DOB: 1950/10/07 Today's Date: 12/09/2019   History of Present Illness  69 y/o woman with acute hypoxic respiratory failure leading to PEA arrest with EMS 8/24.  Post extubation on 8/26 she had severe acute decompensation leading to another bradycardic PEA arrest.  Presumed arrest due to flash pulmonary edema that progressed quickly. New left BBB. PMHx: CAD, HTN  Clinical Impression  Pt alert, communicating and very engaged throughout session. Pt mouthing words around ETT and writing. Pt disoriented to day only. Pt with good strength all extremities and good trunk control in sitting. Pt able to stand from end of bed with foot egress utilized x 3 trials with bil UE support for stability and assist for lines. Pt stood grossly 30 sec max on last trial with report of fatigue. Pt with decreased function, transfers and cardiopulmonary status who will benefit from acute therapy to maximize independence to decrease burden of care. Pt encouraged to continue movement all extremities and assisting with bed mobility while on vent.  PRVC 40% with RR 25-40 during session HR 76-82 BP before activity 129/46 (71) After 123/53 (72)    Follow Up Recommendations Home health PT;Supervision/Assistance - 24 hour (pending activity post extubation)    Equipment Recommendations  Rolling walker with 5" wheels    Recommendations for Other Services OT consult     Precautions / Restrictions Precautions Precautions: Other (comment) Precaution Comments: ETT 26 cm, vent, cortrak      Mobility  Bed Mobility               General bed mobility comments: supine to sit with bed functions into foot egress. Mod +2 to slide fully to [facility] with pt assisting with bil feet to push  Transfers Overall transfer level: Needs assistance   Transfers: Sit to/from Stand Sit to Stand: Min assist;+2 physical assistance         General  transfer comment: pt stood x 3 trials with min +2 assist from foot egress with cues for hand placement and safety with assist for lines and ETT  Ambulation/Gait             General Gait Details: not yet able  Stairs            Wheelchair Mobility    Modified Rankin (Stroke Patients Only)       Balance Overall balance assessment: No apparent balance deficits (not formally assessed)                                           Pertinent Vitals/Pain Pain Assessment: No/denies pain    Home Living Family/patient expects to be discharged to:: Private residence Living Arrangements: Alone Available Help at Discharge: Family;Available PRN/intermittently Type of Home: Apartment Home Access: Level entry     Home Layout: One level Home Equipment: None      Prior Function Level of Independence: Independent         Comments: pt enjoys reading and crochet     Hand Dominance        Extremity/Trunk Assessment   Upper Extremity Assessment Upper Extremity Assessment: Overall WFL for tasks assessed    Lower Extremity Assessment Lower Extremity Assessment: Overall WFL for tasks assessed    Cervical / Trunk Assessment Cervical / Trunk Assessment: Normal  Communication   Communication: Other (comment) (ETT on vent,  mouthing words and writing)  Cognition Arousal/Alertness: Awake/alert Behavior During Therapy: WFL for tasks assessed/performed Overall Cognitive Status: Within Functional Limits for tasks assessed                                        General Comments      Exercises General Exercises - Lower Extremity Heel Slides: AROM;Both;Supine;10 reps   Assessment/Plan    PT Assessment Patient needs continued PT services  PT Problem List Decreased mobility;Decreased activity tolerance;Cardiopulmonary status limiting activity;Decreased knowledge of use of DME       PT Treatment Interventions DME instruction;Therapeutic  exercise;Gait training;Functional mobility training;Therapeutic activities;Patient/family education    PT Goals (Current goals can be found in the Care Plan section)  Acute Rehab PT Goals Patient Stated Goal: return home to crochet and read PT Goal Formulation: With patient Time For Goal Achievement: 12/23/19 Potential to Achieve Goals: Good    Frequency Min 3X/week   Barriers to discharge Decreased caregiver support      Co-evaluation               AM-PAC PT "6 Clicks" Mobility  Outcome Measure Help needed turning from your back to your side while in a flat bed without using bedrails?: A Little Help needed moving from lying on your back to sitting on the side of a flat bed without using bedrails?: A Lot Help needed moving to and from a bed to a chair (including a wheelchair)?: A Lot Help needed standing up from a chair using your arms (e.g., wheelchair or bedside chair)?: A Lot Help needed to walk in hospital room?: Total Help needed climbing 3-5 steps with a railing? : Total 6 Click Score: 11    End of Session   Activity Tolerance: Patient tolerated treatment well Patient left: in bed;with call bell/phone within reach;with family/visitor present Nurse Communication: Mobility status PT Visit Diagnosis: Other abnormalities of gait and mobility (R26.89);Difficulty in walking, not elsewhere classified (R26.2)    Time: 6720-9470 PT Time Calculation (min) (ACUTE ONLY): 26 min   Charges:   PT Evaluation $PT Eval High Complexity: 1 High          Lott Seelbach P, PT Acute Rehabilitation Services Pager: [phone] Office: [phone]   Sandy Salaam Estefanny Moler 12/09/2019, 9:35 AM

## 2019-12-10 ENCOUNTER — Inpatient Hospital Stay (HOSPITAL_COMMUNITY): Payer: Medicare PPO

## 2019-12-10 LAB — GLUCOSE, CAPILLARY
Glucose-Capillary: 202 mg/dL — ABNORMAL HIGH (ref 70–99)
Glucose-Capillary: 163 mg/dL — ABNORMAL HIGH (ref 70–99)
Glucose-Capillary: 202 mg/dL — ABNORMAL HIGH (ref 70–99)
Glucose-Capillary: 161 mg/dL — ABNORMAL HIGH (ref 70–99)
Glucose-Capillary: 137 mg/dL — ABNORMAL HIGH (ref 70–99)
Glucose-Capillary: 129 mg/dL — ABNORMAL HIGH (ref 70–99)

## 2019-12-10 LAB — MAGNESIUM: Magnesium: 2.2 mg/dL (ref 1.7–2.4)

## 2019-12-10 LAB — CBC
HCT: 26.7 % — ABNORMAL LOW (ref 36.0–46.0)
Hemoglobin: 8.3 g/dL — ABNORMAL LOW (ref 12.0–15.0)
MCH: 30.6 pg (ref 26.0–34.0)
MCHC: 31.1 g/dL (ref 30.0–36.0)
MCV: 98.5 fL (ref 80.0–100.0)
Platelets: 142 10*3/uL — ABNORMAL LOW (ref 150–400)
RBC: 2.71 MIL/uL — ABNORMAL LOW (ref 3.87–5.11)
RDW: 12.9 % (ref 11.5–15.5)
WBC: 11.2 10*3/uL — ABNORMAL HIGH (ref 4.0–10.5)
nRBC: 0 % (ref 0.0–0.2)

## 2019-12-10 LAB — CULTURE, BLOOD (ROUTINE X 2)
Culture: NO GROWTH
Culture: NO GROWTH

## 2019-12-10 LAB — HEPARIN LEVEL (UNFRACTIONATED): Heparin Unfractionated: 0.29 IU/mL — ABNORMAL LOW (ref 0.30–0.70)

## 2019-12-10 LAB — BASIC METABOLIC PANEL
Anion gap: 6 (ref 5–15)
BUN: 28 mg/dL — ABNORMAL HIGH (ref 8–23)
CO2: 30 mmol/L (ref 22–32)
Calcium: 8.4 mg/dL — ABNORMAL LOW (ref 8.9–10.3)
Chloride: 109 mmol/L (ref 98–111)
Creatinine, Ser: 1.1 mg/dL — ABNORMAL HIGH (ref 0.44–1.00)
GFR calc Af Amer: >60 mL/min (ref >60)
GFR calc non Af Amer: 52 mL/min — ABNORMAL LOW (ref >60)
Glucose, Bld: 214 mg/dL — ABNORMAL HIGH (ref 70–99)
Potassium: 3.8 mmol/L (ref 3.5–5.1)
Sodium: 145 mmol/L (ref 135–145)

## 2019-12-10 LAB — BRAIN NATRIURETIC PEPTIDE: B Natriuretic Peptide: 611.7 pg/mL — ABNORMAL HIGH (ref 0.0–100.0)

## 2019-12-10 MED ORDER — ORAL CARE MOUTH RINSE
15.0000 mL | Freq: Two times a day (BID) | OROMUCOSAL | Status: DC
  Administered 2019-12-10: 15 mL via OROMUCOSAL

## 2019-12-10 MED ORDER — METOPROLOL TARTRATE 12.5 MG HALF TABLET
12.5000 mg | ORAL_TABLET | Freq: Two times a day (BID) | ORAL | Status: DC
  Administered 2019-12-10 – 2019-12-11 (×2): 12.5 mg via ORAL
  Filled 2019-12-10 (×2): qty 1

## 2019-12-10 MED ORDER — FUROSEMIDE 10 MG/ML IJ SOLN
40.0000 mg | Freq: Every day | INTRAMUSCULAR | Status: DC
  Administered 2019-12-10 – 2019-12-12 (×3): 40 mg via INTRAVENOUS
  Filled 2019-12-10 (×3): qty 4

## 2019-12-10 NOTE — Progress Notes (Signed)
ANTICOAGULATION CONSULT NOTE  Pharmacy Consult for heparin Indication: chest pain/ACS  Patient Measurements: Height: 5\' 2"  (157.5 cm) Weight: 91.5 kg (201 lb 11.5 oz) IBW/kg (Calculated) : 50.1 Heparin Dosing Weight: 72.4 kg  Vital Signs: Temp: 99.1 F (37.3 C) (08/29 0800) Temp Source: Esophageal (08/29 0800) BP: 130/57 (08/29 1200) Pulse Rate: 80 (08/29 1200)  Labs: Recent Labs    12/08/19 0324 12/08/19 0324 12/08/19 0335 12/08/19 1655 12/09/19 0521 12/09/19 1653 12/10/19 0450  HGB 10.8*   < > 9.9*  --  9.1*  --  8.3*  HCT 30.9*   < > 29.0*  --  28.9*  --  26.7*  PLT 153  --   --   --  142*  --  142*  HEPARINUNFRC 0.43  --   --   --  0.34  --  0.29*  CREATININE 1.37*  --   --    < > 1.38* 1.20* 1.10*   < > = values in this interval not displayed.    Estimated Creatinine Clearance: 52.3 mL/min (A) (by C-G formula based on SCr of 1.1 mg/dL (H)).  Assessment: 69 YO female presenting s/p PEA arrest. Patient called EMS with acute complaints of shortness of breath and was found unresponsive in PEA arrest.  EMS provided 3 rounds of CPR and obtained ROSC prior to ED arrival.  Patient does have a history of PEA arrest in 2018.  No anticoagulation PTA.  Pharmacy was consulted to start heparin to rule out ACS.  Heparin level just barely below goal level on gtt at 1050 units/hr. No bleeding noted.  Hgb trending down since admission, Pltc ok.  No overt bleeding or complications noted.  Goal of Therapy:  Heparin level 0.3-0.7 units/ml Monitor platelets by anticoagulation protocol: Yes   Plan:  - Increase IV heparin to 1100 units/hr. - F/u daily heparin level, CBC- F/u plans for cath lab eventually.  [place] Crane, Roylene Reason, BCCP Clinical Pharmacist  12/10/2019 12:15 PM   [facility] pharmacy phone numbers are listed on [email or web address]

## 2019-12-10 NOTE — Progress Notes (Signed)
RT NOTES: Patient had episode of bradycardia with rate dropping to the 50s sustained. Placed patient back on vent on pressure support. Patient has moderate pink-tinged secretions.

## 2019-12-10 NOTE — Plan of Care (Signed)
  Problem: Clinical Measurements: Goal: Diagnostic test results will improve Outcome: Progressing Goal: Respiratory complications will improve Outcome: Progressing Goal: Cardiovascular complication will be avoided Outcome: Progressing   Problem: Elimination: Goal: Will not experience complications related to bowel motility Outcome: Progressing Goal: Will not experience complications related to urinary retention Outcome: Progressing   Problem: Pain Managment: Goal: General experience of comfort will improve Outcome: Progressing

## 2019-12-10 NOTE — Progress Notes (Signed)
RT NOTES: Placed patient on 40% T-piece per Dr Lamonte Sakai. Sats 96% and patient tolerating well. Will continue to monitor.

## 2019-12-10 NOTE — Progress Notes (Signed)
Progress Note  Patient Name: Samariah Hokenson Date of Encounter: 12/10/2019  Advanced Heart Failure Cardiologist: Linna Hoff Bensimhon  Subjective   Remains intubated on the ventilator, awake and alert.  Inpatient Medications    Scheduled Meds: . aspirin  81 mg Per Tube Daily  . chlorhexidine gluconate (MEDLINE KIT)  15 mL Mouth Rinse BID  . Chlorhexidine Gluconate Cloth  6 each Topical Daily  . docusate  100 mg Per Tube BID  . feeding supplement (PROSource TF)  45 mL Per Tube BID  . fentaNYL (SUBLIMAZE) injection  25 mcg Intravenous Once  . insulin aspart  3-9 Units Subcutaneous Q4H  . insulin detemir  12 Units Subcutaneous Q12H  . mouth rinse  15 mL Mouth Rinse 10 times per day  . polyethylene glycol  17 g Per Tube Daily  . rosuvastatin  20 mg Per Tube Daily  . sertraline  50 mg Per Tube Daily  . sodium chloride flush  10 mL Intracatheter Q12H   Continuous Infusions: . sodium chloride 10 mL/hr (12/07/19 1833)  . ampicillin-sulbactam (UNASYN) IV 3 g (12/10/19 0615)  . dexmedetomidine (PRECEDEX) IV infusion 0.4 mcg/kg/hr (12/10/19 0600)  . feeding supplement (VITAL AF 1.2 CAL) 60 mL/hr at 12/10/19 0600  . fentaNYL infusion INTRAVENOUS 150 mcg/hr (12/10/19 0600)  . heparin 1,050 Units/hr (12/10/19 0600)  . norepinephrine (LEVOPHED) Adult infusion Stopped (12/09/19 0836)   PRN Meds: sodium chloride, acetaminophen, docusate, fentaNYL, labetalol, ondansetron (ZOFRAN) IV   Vital Signs    Vitals:   12/10/19 0500 12/10/19 0530 12/10/19 0600 12/10/19 0630  BP:   (!) 118/52   Pulse: 84 83 78 85  Resp: (!) $RemoveB'30 10 20 'SqKpFILy$ (!) 25  Temp:      TempSrc:      SpO2: 98% 98% 93% 98%  Weight:      Height:        Intake/Output Summary (Last 24 hours) at 12/10/2019 0828 Last data filed at 12/10/2019 0600 Gross per 24 hour  Intake 2226.55 ml  Output 1528 ml  Net 698.55 ml   Filed Weights   12/08/19 0500 12/09/19 0500 12/10/19 0400  Weight: 86.8 kg 86.9 kg 91.5 kg    Telemetry      Sinus rhythm.  Personally reviewed.  ECG    An ECG dated 12/07/2019 was personally reviewed today and demonstrated:  Sinus rhythm with left bundle block.  Physical Exam   GEN:  Awake on ventilator.   Neck: No JVD. Cardiac:  RRR without gallop.  Respiratory:  Clear anteriorly, no wheezing. GI: Soft, nontender, bowel sounds present. MS:  No pitting edema; No deformity.  Labs    Chemistry Recent Labs  Lab 12/06/19 1605 12/06/19 1605 12/07/19 0304 12/07/19 0315 12/07/19 0928 12/07/19 1138 12/09/19 0521 12/09/19 1653 12/10/19 0450  NA 140   < > 140   < > 142   < > 144 145 145  K 3.8   < > 3.7   < > 3.7   < > 4.3 4.1 3.8  CL 107   < > 108   < > 105   < > 106 107 109  CO2 21*   < > 22   < > 20*   < > $R'29 27 30  'tL$ GLUCOSE 219*   < > 153*   < > 320*   < > 171* 255* 214*  BUN 13   < > 13   < > 15   < > 36* 35* 28*  CREATININE 0.95   < >  0.90   < > 1.20*   < > 1.38* 1.20* 1.10*  CALCIUM 8.1*   < > 8.0*   < > 8.2*   < > 8.8* 8.7* 8.4*  PROT 6.0*  --  5.9*  --  6.2*  --   --   --   --   ALBUMIN 2.8*  --  2.7*  --  2.7*  --   --   --   --   AST 59*  --  56*  --  275*  --   --   --   --   ALT 24  --  24  --  214*  --   --   --   --   ALKPHOS 84  --  79  --  121  --   --   --   --   BILITOT 0.7  --  1.0  --  0.8  --   --   --   --   GFRNONAA >60   < > >60   < > 47*   < > 39* 47* 52*  GFRAA >60   < > >60   < > 54*   < > 46* 54* >60  ANIONGAP 12   < > 10   < > 17*   < > $R'9 11 6   'cW$ < > = values in this interval not displayed.     Hematology Recent Labs  Lab 12/08/19 0324 12/08/19 0324 12/08/19 0335 12/09/19 0521 12/10/19 0450  WBC 14.6*  --   --  11.2* 11.2*  RBC 3.30*  --   --  3.01* 2.71*  HGB 10.8*   < > 9.9* 9.1* 8.3*  HCT 30.9*   < > 29.0* 28.9* 26.7*  MCV 93.6  --   --  96.0 98.5  MCH 32.7  --   --  30.2 30.6  MCHC 35.0  --   --  31.5 31.1  RDW 12.9  --   --  12.8 12.9  PLT 153  --   --  142* 142*   < > = values in this interval not displayed.    Cardiac  Enzymes Recent Labs  Lab 12/10/2019 1530 12/07/2019 2044 12/06/19 1605 12/07/19 0928  TROPONINIHS 293* 8,932* 6,658* 2,702*    BNP Recent Labs  Lab 12/08/19 0324 12/09/19 0521 12/10/19 0450  BNP 228.5* 136.4* 611.7*     Radiology    No results found.  Cardiac Studies   Echocardiogram 11/25/2019: 1. Endocardial border definition is poor but systolic function appears to  be grossly normal. Cannot rule out mild septal hypokinesis. Left  ventricular ejection fraction, by estimation, is 55 to 60%. The left  ventricle has normal function. The left  ventricle has no regional wall motion abnormalities. Left ventricular  diastolic parameters are indeterminate.  2. Right ventricular systolic function is normal. The right ventricular  size is normal. There is mildly elevated pulmonary artery systolic  pressure.  3. The mitral valve is normal in structure. No evidence of mitral valve  regurgitation. No evidence of mitral stenosis.  4. The aortic valve is normal in structure. Aortic valve regurgitation is  not visualized. No aortic stenosis is present.  5. The inferior vena cava is normal in size with <50% respiratory  variability, suggesting right atrial pressure of 8 mmHg.   Patient Profile     69 y.o. female with a history of cardiac arrest 2018 secondary to pulmonary edema, CAD s/p stentto RCA  2002 and distal LAD 0762, chronic diastolic heart failure, HTN, Hyperlipidemia, spinal stenosis, tobacco use anddiabetiswho is being seen for the evaluation ofNSTEMI.  Assessment & Plan    1.  NSTEMI, peak high-sensitivity troponin I of 6658.  New left bundle branch block by ECG. Echocardiogram reveals LVEF 55 to 60% range, possibly mild septal hypokinesis, normal RV contraction.  No obvious chest pain at this time, hemodynamically stable.  BNP increased to 612, follow-up chest x-ray pending.  2.  Status post PEA arrest, possibly secondary to pulmonary edema given similar history in  the past.  Reintubated after initial extubation attempt earlier in hospital stay and making progress with CCM management.  She continues to wean toward extubation.  3.  Fevers, temperature spike up to 101.1 yesterday afternoon.  Possibility of aspiration pneumonia versus pneumonitis.  She is on empiric antibiotics as well.  Continues to wean on ventilator with management by CCM, hopefully to extubate soon.  Follow-up chest x-ray, starting on Lasix.  Off Levophed since yesterday.  She continues on aspirin and Crestor.  Blood pressure has been stable, will initiate low-dose beta-blocker in light of non-STEMI, hold off on resuming ARB at this time.  Ultimately, follow-up diagnostic cardiac catheterization is planned when she is more stable.  Signed, Rozann Lesches, MD  12/10/2019, 8:28 AM

## 2019-12-10 NOTE — Progress Notes (Signed)
NAME:  [name] Bean, MRN:  [id number], DOB:  1951-02-21, LOS: 5 ADMISSION DATE:  11/29/2019, CONSULTATION DATE:  12/12/2019 REFERRING MD:  Ralene Bathe - EM , CHIEF COMPLAINT:  Respiratory arrest, hypoxemia   Brief History   69yo female presented to ED after PEA cardiac arrest likely due to respiratory failure.  History of present illness   [name] Bean is a 69 y.o. with PHX significant for HTN, HLD, DM, CHF, cardiac arrest, CAD, and anxiety who presented to ED s/p cardiac arrest. Per report patient called EMS with acute complaints of SOB and on arrival EMS found patient unresponsive in PEA arrest. EMS provided 3 rounds of CPR and obtained ROSC prior to ED arrival. No ACLS medications were given. PCCM called urgently to evaluate pt in ED due to hypoxemia.   CXR with bilateral ASD, concerning for possible ARDS. ABG 7.2/52/48 Labs acquired in ED are hemolyzed, resent but unavailable for review   Past Medical History   Diastolic heart failure HTN DM Prior PEA arrest CAD   Significant Hospital Events   8/24 BIB Ems for CC SOB. On EMS arrival, pulseless and hypoxemic. ROSC achieved. PCCM consulted for hypoxemia shortly after intubation. CVC and Aline placed by PCCM in ED 8/25 [facility] w/ much improved aeration. Able to start weaning PEEP/FIO2 down to 10 from 14 and FIO2 down from 100% to 70%. Transitioning OFF insulin gtt. Transitioning from Fent/propofol to precedex.  8/26: Seen by cardiology on the 25th for non-ST elevation MI.  Started on IV heparin and aspirin on the 25th.  Past spontaneous breathing trial on the morning of 8/26 and was extubated.  Did have some fairly significant hypertension post extubation-->progressed to rapid resp failure and subsequent PEA arrest. Re intubated. Time to ROSC 9 minutes.  8/27: More awake.  Weaning PEEP and FiO2.  Continuing to diurese and maximize hemodynamics not ready for extubation Consults:    Procedures:  8/24 ETT>8/26>>> 8/24  CVC> 8/24 Aline>  Significant Diagnostic Tests:  8/24 CXR> Diffuse bilateral pulmonary opacities. ETT 2.5cm above carina.  8/24 CTA chest>>1. No CT evidence of pulmonary embolism.2. Small bilateral pleural effusions with findings of pulmonary edema or ARDS.3. Clusters of airspace consolidation primarily involving the left lower lobe as well as upper lobes may represent pneumonia. Clinical correlation recommended.4. Aortic Atherosclerosis (ICD10-I70.0). 8/24 CT H >> negative  ECHO 8/24: Left Ventricle: Endocardial border definition is poor but systolic  function appears to be grossly normal. Cannot rule out mild septal  hypokinesis. Left ventricular ejection fraction, by estimation, is 55 to 60%. The left ventricle has normal function. The left ventricle has no regional wall motion abnormalities. Definity contrast agent was given IV to delineate the left ventricular endocardial borders. The left ventricular internal cavity size was normal in size. There is no left ventricular hypertrophy. Left ventricular diastolic parameters are indeterminate.  Micro Data:  8/24 SARS Cov2> neg 8/24 RVP >> 8/24 Tracheal aspirate >> 8/24 BCx>>   Antimicrobials:  Unasyn 8/24 >>>  Interim history/subjective:   Awake and interacting Tolerated PSV on 8/28 Precedex restarted at 0.4, remains on fentanyl She received metoprolol twice overnight for hypertension    Objective   Blood pressure (!) 114/52, pulse 87, temperature 99.1 F (37.3 C), temperature source Esophageal, resp. rate 17, height 5\' 2"  (1.575 m), weight 91.5 kg, SpO2 92 %. CVP:  [17 mmHg-22 mmHg] 17 mmHg  Vent Mode: PSV;CPAP FiO2 (%):  [40 %] 40 % Set Rate:  [30 bmp] 30 bmp Vt Set:  [  300 mL] 300 mL PEEP:  [5 cmH20-8 cmH20] 5 cmH20 Pressure Support:  [10 cmH20] 10 cmH20   Intake/Output Summary (Last 24 hours) at 12/10/2019 0914 Last data filed at 12/10/2019 0600 Gross per 24 hour  Intake 2151.82 ml  Output 1528 ml  Net 623.82 ml   Filed  Weights   12/08/19 0500 12/09/19 0500 12/10/19 0400  Weight: 86.8 kg 86.9 kg 91.5 kg    Examination:  General this is a 69 year old black female on mechanical ventilation, comfortable HEENT ET tube in place, no oral lesions, pupils equal Pulmonary: Bilateral inspiratory crackles Cardiac regular, distant, no murmur Abdomen nondistended, positive bowel sounds Extremities trace lower extremity edema Neuro awake, alert, follows commands, interacting  Chest x-ray 8/29 reviewed by me, shows improvement in her bilateral alveolar interstitial infiltrates.  Resolved Hospital Problem list   PEA cardiac arrest: Felt to be secondary to respiratory failure with acute pulmonary edema  lactic acidosis status post cardiac arrest Circulatory shock: Status post cardiac arrest felt to be medication related with possible cardiogenic component plus/minus sepsis: Resolved as of 8/26 Recurrent PEA arrest (second arrest this admit) w/ subsequent cardiogenic shock  2/2 flash Pulmonary edema and subsequent acute respiratory failure Assessment & Plan:    Acute diastolic HF w/ Pulmonary edema, with non-ST elevation MI -had graduated from Dr. B HF clinic, April 2021 -- EF 60%; still 55-60% w/ possible mild septal hypokinesis; RV fxn nml, RV size NML,  Hx HTN Shock, suspect due to requirement for sedating medications but fever overnight 8/27 so consider evolving sepsis Plan IV heparin Continue aspirin, Crestor Home carvedilol, Demadex, Cozaar, hydralazine all on hold.  Planning to start metoprolol 8/29.  Likely convert back to carvedilol when more stable Appreciate cardiology assistance Empiric Unasyn for possible aspiration, left lower lobe pneumonia.  Follow culture data   Recurrent Acute respiratory failure with hypoxemia in the setting of diffuse pulmonary edema Question possible aspiration pneumonia Plan Wean PEEP to 5, PRVC 8 cc/kg Plan SBT 8/29 with a goal to extubate.  We will need to make sure  that she has good blood pressure control, no agitation at time of extubation.  Likely continue Precedex at extubation, be prepared for tight blood pressure control. Consider extubation straight to BiPAP Follow chest x-ray Antibiotics as above  Fluid and electrolyte imbalance: Intermittent Plan Follow BMP Replace electrolytes if indicated  Mild AKI Current fluid balance is -2.7 L total Some improvement in serum creatinine 8/28 9 Plan Restart lower dose furosemide on 8/29 BMP and urine output  DM w/ hyperglycemia  Plan Any sliding-scale insulin with current Levemir dosing Blood glucose goal 140-180  Best practice:  Diet: EN  Pain/Anxiety/Delirium protocol (if indicated): Fent, Precedex VAP protocol (if indicated): Yes  DVT prophylaxis: SQ Heparin GI prophylaxis: PPI  Glucose control: SSI  Mobility: BR-->PT consult Code Status: Full  Family Communication: Sister updated at bedside on 8/29 Disposition: ICU  My critical care time 35 minutes.    Baltazar Apo, MD, PhD 12/10/2019, 9:14 AM [facility] [phone] or if no answer [phone]

## 2019-12-10 NOTE — Procedures (Signed)
Extubation Procedure Note  Patient Details:   Name: [name] Bean DOB: 1950-06-26 MRN: [id number]   Airway Documentation:   Patient extubated per orders. Patient had positive cuff leak prior to extubation. Placed on 4lpm nasal cannula. RN and MD at bedside.  Vent end date: 12/10/19 Vent end time: 1411   Evaluation  O2 sats: stable throughout Complications: No apparent complications Patient did tolerate procedure well. Bilateral Breath Sounds: Rhonchi, Diminished   Yes  Ander Purpura 12/10/2019, 2:11 PM

## 2019-12-10 NOTE — Progress Notes (Signed)
Remaining 100 mls of fentanyl infusion wasted in steri cycle with Terie Purser, RN.

## 2019-12-11 ENCOUNTER — Inpatient Hospital Stay (HOSPITAL_COMMUNITY): Payer: Medicare PPO

## 2019-12-11 LAB — CBC
HCT: 28 % — ABNORMAL LOW (ref 36.0–46.0)
Hemoglobin: 9 g/dL — ABNORMAL LOW (ref 12.0–15.0)
MCH: 32.1 pg (ref 26.0–34.0)
MCHC: 32.1 g/dL (ref 30.0–36.0)
MCV: 100 fL (ref 80.0–100.0)
Platelets: 183 10*3/uL (ref 150–400)
RBC: 2.8 MIL/uL — ABNORMAL LOW (ref 3.87–5.11)
RDW: 12.8 % (ref 11.5–15.5)
WBC: 8.7 10*3/uL (ref 4.0–10.5)
nRBC: 0 % (ref 0.0–0.2)

## 2019-12-11 LAB — GLUCOSE, CAPILLARY
Glucose-Capillary: 119 mg/dL — ABNORMAL HIGH (ref 70–99)
Glucose-Capillary: 164 mg/dL — ABNORMAL HIGH (ref 70–99)
Glucose-Capillary: 191 mg/dL — ABNORMAL HIGH (ref 70–99)
Glucose-Capillary: 218 mg/dL — ABNORMAL HIGH (ref 70–99)
Glucose-Capillary: 213 mg/dL — ABNORMAL HIGH (ref 70–99)
Glucose-Capillary: 172 mg/dL — ABNORMAL HIGH (ref 70–99)

## 2019-12-11 LAB — HEPARIN LEVEL (UNFRACTIONATED)
Heparin Unfractionated: 0.24 IU/mL — ABNORMAL LOW (ref 0.30–0.70)
Heparin Unfractionated: 0.22 IU/mL — ABNORMAL LOW (ref 0.30–0.70)

## 2019-12-11 LAB — BASIC METABOLIC PANEL
Anion gap: 14 (ref 5–15)
BUN: 28 mg/dL — ABNORMAL HIGH (ref 8–23)
CO2: 26 mmol/L (ref 22–32)
Calcium: 8.7 mg/dL — ABNORMAL LOW (ref 8.9–10.3)
Chloride: 107 mmol/L (ref 98–111)
Creatinine, Ser: 1.14 mg/dL — ABNORMAL HIGH (ref 0.44–1.00)
GFR calc Af Amer: 58 mL/min — ABNORMAL LOW (ref >60)
GFR calc non Af Amer: 50 mL/min — ABNORMAL LOW (ref >60)
Glucose, Bld: 141 mg/dL — ABNORMAL HIGH (ref 70–99)
Potassium: 3 mmol/L — ABNORMAL LOW (ref 3.5–5.1)
Sodium: 147 mmol/L — ABNORMAL HIGH (ref 135–145)

## 2019-12-11 LAB — MAGNESIUM: Magnesium: 2.3 mg/dL (ref 1.7–2.4)

## 2019-12-11 LAB — CULTURE, RESPIRATORY W GRAM STAIN: Special Requests: NORMAL

## 2019-12-11 MED ORDER — CARVEDILOL 6.25 MG PO TABS
6.2500 mg | ORAL_TABLET | Freq: Two times a day (BID) | ORAL | Status: DC
  Administered 2019-12-11 – 2019-12-12 (×2): 6.25 mg via ORAL
  Filled 2019-12-11 (×2): qty 1

## 2019-12-11 MED ORDER — POTASSIUM CHLORIDE 10 MEQ/50ML IV SOLN
10.0000 meq | INTRAVENOUS | Status: AC
  Administered 2019-12-11 (×4): 10 meq via INTRAVENOUS
  Filled 2019-12-11 (×4): qty 50

## 2019-12-11 MED ORDER — RESOURCE THICKENUP CLEAR PO POWD
ORAL | Status: DC | PRN
  Filled 2019-12-11 (×2): qty 125

## 2019-12-11 MED ORDER — POTASSIUM CHLORIDE CRYS ER 20 MEQ PO TBCR: 20.0000 meq | EXTENDED_RELEASE_TABLET | ORAL | Status: DC

## 2019-12-11 MED ORDER — HYDRALAZINE HCL 25 MG PO TABS
25.0000 mg | ORAL_TABLET | Freq: Three times a day (TID) | ORAL | Status: DC
  Administered 2019-12-11 – 2019-12-12 (×3): 25 mg via ORAL
  Filled 2019-12-11 (×3): qty 1

## 2019-12-11 MED ORDER — POTASSIUM CHLORIDE 20 MEQ/15ML (10%) PO SOLN: 20.0000 meq | ORAL | Status: DC

## 2019-12-11 MED ORDER — SERTRALINE HCL 50 MG PO TABS
50.0000 mg | ORAL_TABLET | Freq: Every day | ORAL | Status: DC
  Administered 2019-12-11: 50 mg via ORAL
  Filled 2019-12-11: qty 1

## 2019-12-11 MED ORDER — SODIUM CHLORIDE 0.9 % IV SOLN
2.0000 g | INTRAVENOUS | Status: DC
  Administered 2019-12-11 – 2019-12-12 (×2): 2 g via INTRAVENOUS
  Filled 2019-12-11 (×2): qty 2

## 2019-12-11 MED ORDER — ROSUVASTATIN CALCIUM 20 MG PO TABS
20.0000 mg | ORAL_TABLET | Freq: Every day | ORAL | Status: DC
  Administered 2019-12-11: 20 mg via ORAL
  Filled 2019-12-11: qty 1

## 2019-12-11 MED ORDER — ASPIRIN 81 MG PO CHEW
81.0000 mg | CHEWABLE_TABLET | Freq: Every day | ORAL | Status: DC
  Administered 2019-12-11: 81 mg via ORAL
  Filled 2019-12-11: qty 1

## 2019-12-11 MED ORDER — ACETAMINOPHEN 325 MG PO TABS: 650.0000 mg | ORAL_TABLET | ORAL | Status: DC | PRN

## 2019-12-11 MED ORDER — DEXMEDETOMIDINE HCL IN NACL 400 MCG/100ML IV SOLN
0.0000 ug/kg/h | INTRAVENOUS | Status: DC
  Administered 2019-12-11: 0.4 ug/kg/h via INTRAVENOUS
  Filled 2019-12-11: qty 100

## 2019-12-11 NOTE — Progress Notes (Signed)
Modified Barium Swallow Progress Note  Patient Details  Name: [name] Bean MRN: [id number] Date of Birth: January 25, 1951  Today's Date: 12/11/2019  Modified Barium Swallow completed.  Full report located under Chart Review in the Imaging Section.  Brief recommendations include the following:  Clinical Impression  Pt presents with pharyngeal dysphagia characterized by a pharyngeal delay, reduced lingual retraction, and reduced anterior laryngeal movement. Pt's swallow was triggered with the head of the bolus at the level of valleculae and pyriform sinuses. She demonstrated vallecular residue and pyriform sinus residue which was mild to moderate depending on bolus size and consistency. Pt exhibited aspiration (PAS 7) with thin liquids and penetration (PAS 3) and ultimate aspiration (PAS 7, 8) was also noted with nectar thick liquids. Most instances of laryngeal invasion resulted in throat clearing and/or coughing. This was effective in propelling the aspirant superiorly but subsequent aspiration was inconsistently noted. Postural modifications were attempted but no functional benefit was noted. Use of an effortful swallow inconsistently eliminated laryngeal invasion with nectar thick liquids but not with thin liquids. A dysphagia 2 diet with honey thick liquids is recommended at this time with allowance of ice chips between meals following oral care. SLP will follow for dysphagia treatment.    Swallow Evaluation Recommendations       SLP Diet Recommendations: Dysphagia 2 (Fine chop) solids;Honey thick liquids   Liquid Administration via: Cup;Straw   Medication Administration: Whole meds with liquid   Supervision: Patient able to self feed   Compensations: Slow rate;Small sips/bites;Follow solids with liquid;Clear throat intermittently   Postural Changes: Seated upright at 90 degrees   Oral Care Recommendations: Oral care BID   Other Recommendations: Order thickener from  [place] Hardin Negus, [place], [place] Office number [phone] Pager [place] 12/11/2019,2:38 PM

## 2019-12-11 NOTE — Evaluation (Signed)
Occupational Therapy Evaluation Patient Details Name: [name] Bean MRN: [id number] DOB: 1950-09-25 Today's Date: 12/11/2019    History of Present Illness 69 y/o woman with acute hypoxic respiratory failure leading to PEA arrest with EMS 8/24.  Post extubation on 8/26 she had severe acute decompensation leading to another bradycardic PEA arrest.  Presumed arrest due to flash pulmonary edema that progressed quickly. New left BBB. PMHx: CAD, HTN   Clinical Impression   Pt's baseline is independent in ADL and functional transfers. Pt today is greeted sitting EOB and eager to get to chair, she is mod to min A for stand pivot transfer requiring assist for boost and balance. (Of note: Pt is 5'1" and so her feet do not touch the floor sitting EOB or when in recliner) Pt DOE 2/4 - but no drop in saturation Pt remained >90% on 6L O2 via [place]. Pt set up for grooming in chair after transfer, mod  For LB ADL at this time. Pt will benefit from skilled OT in the acute setting as well as afterwards at the [facility] (watch for progressing past needing HHOT) to maximize safety and independence in ADL and functional transfers. Sister present throughout, and Pt left with SLP for bedside swallow. Next session to focus on activity tolerance for grooming at sink or bathroom ambulation and initiate energy conservation education.    Follow Up Recommendations  Home health OT;Supervision/Assistance - 24 hour (initially)    Equipment Recommendations  3 in 1 bedside commode    Recommendations for Other Services       Precautions / Restrictions Precautions Precautions: Fall Restrictions Weight Bearing Restrictions: No      Mobility Bed Mobility               General bed mobility comments: sitting EOB when OT entered the room  Transfers Overall transfer level: Needs assistance Equipment used: 1 person hand held assist Transfers: Sit to/from Stand;Stand Pivot Transfers Sit to Stand: Min assist Stand  pivot transfers: Mod assist       General transfer comment: min to mod A for boost and balance, vc for safe hand placement    Balance Overall balance assessment: Needs assistance Sitting-balance support: Single extremity supported;Feet supported Sitting balance-Leahy Scale: Good     Standing balance support: Bilateral upper extremity supported Standing balance-Leahy Scale: Poor Standing balance comment: dependent on external support                           ADL either performed or assessed with clinical judgement   ADL Overall ADL's : Needs assistance/impaired Eating/Feeding: NPO   Grooming: Set up;Sitting;Wash/dry hands;Wash/dry face Grooming Details (indicate cue type and reason): in chair Upper Body Bathing: Sitting;Minimal assistance Upper Body Bathing Details (indicate cue type and reason): assist for back Lower Body Bathing: Sitting/lateral leans;Moderate assistance   Upper Body Dressing : Min guard   Lower Body Dressing: Moderate assistance;Sitting/lateral leans Lower Body Dressing Details (indicate cue type and reason): balance and boost currently requires assist to manage clothing Toilet Transfer: Moderate assistance;Stand-pivot (approaching min A) Toilet Transfer Details (indicate cue type and reason): BUE support for balance, small boost into standing from EOB   Toileting - Clothing Manipulation Details (indicate cue type and reason): currently with foley     Functional mobility during ADLs: Cueing for sequencing;Minimal assistance (SPT only this session) General ADL Comments: increased SOB with activity, SpO2 remained >90% on 6L O2 via [place]     Vision  Patient Visual Report: No change from baseline       Perception     Praxis      Pertinent Vitals/Pain Pain Assessment: No/denies pain     Hand Dominance Right   Extremity/Trunk Assessment Upper Extremity Assessment Upper Extremity Assessment: Overall WFL for tasks assessed   Lower  Extremity Assessment Lower Extremity Assessment: Overall WFL for tasks assessed   Cervical / Trunk Assessment Cervical / Trunk Assessment: Normal   Communication Communication Communication: No difficulties   Cognition Arousal/Alertness: Awake/alert Behavior During Therapy: WFL for tasks assessed/performed Overall Cognitive Status: Within Functional Limits for tasks assessed                                     General Comments  Pt is short, and so when sitting on the EOB or in chair her feet dangle FYI. VSS throughout session, Pt eager to eat    Exercises     Shoulder Instructions      Home Living Family/patient expects to be discharged to:: Private residence Living Arrangements: Alone Available Help at Discharge: Family;Available PRN/intermittently Type of Home: Apartment Home Access: Level entry     Home Layout: One level               Home Equipment: None          Prior Functioning/Environment Level of Independence: Independent        Comments: pt enjoys reading and crochet        OT Problem List: Decreased activity tolerance;Impaired balance (sitting and/or standing);Decreased safety awareness;Cardiopulmonary status limiting activity      OT Treatment/Interventions: Self-care/ADL training;Energy conservation;DME and/or AE instruction;Therapeutic activities;Patient/family education;Balance training    OT Goals(Current goals can be found in the care plan section) Acute Rehab OT Goals Patient Stated Goal: return home to crochet and read OT Goal Formulation: With patient Time For Goal Achievement: 12/25/19 Potential to Achieve Goals: Good ADL Goals Pt Will Perform Grooming: with supervision;standing Pt Will Perform Upper Body Dressing: with set-up;sitting Pt Will Perform Lower Body Dressing: with set-up;sit to/from stand Pt Will Transfer to Toilet: with supervision;ambulating Pt Will Perform Toileting - Clothing Manipulation and  hygiene: with supervision;sit to/from stand Additional ADL Goal #1: Pt will verbalize 3 ways of conserving energy during ADL routine with no cues  OT Frequency: Min 2X/week   Barriers to D/C: Decreased caregiver support  Pt typically lives alone       Co-evaluation              AM-PAC OT "6 Clicks" Daily Activity     Outcome Measure Help from another person eating meals?: Total (NPO) Help from another person taking care of personal grooming?: A Little Help from another person toileting, which includes using toliet, bedpan, or urinal?: A Lot Help from another person bathing (including washing, rinsing, drying)?: A Little Help from another person to put on and taking off regular upper body clothing?: A Little Help from another person to put on and taking off regular lower body clothing?: A Lot 6 Click Score: 14   End of Session Equipment Utilized During Treatment: Gait belt;Oxygen (6L via [place]) Nurse Communication: Mobility status  Activity Tolerance: Patient tolerated treatment well Patient left: in chair;with call bell/phone within reach;with family/visitor present;Other (comment) (SLP in room)  OT Visit Diagnosis: Other abnormalities of gait and mobility (R26.89);Muscle weakness (generalized) (M62.81)  Time: 2072-1828 OT Time Calculation (min): 22 min Charges:  OT General Charges $OT Visit: 1 Visit OT Evaluation $OT Eval Moderate Complexity: [place] OTR/L Acute Rehabilitation Services Pager: [phone] Office: [place] 12/11/2019, 11:43 AM

## 2019-12-11 NOTE — Progress Notes (Signed)
RT placed patient on BIPAP. Patient tolerating BIPAP settings well at this time. RT will monitor as needed.

## 2019-12-11 NOTE — Progress Notes (Signed)
[place] Progress Note Patient Name: [name] Bean DOB: 1950/10/19 MRN: [id number]   Date of Service  12/11/2019  HPI/Events of Note  Patient freaks out when placed on BIPAP, and needs something for anxiety and panic attack.  eICU Interventions  Low dose Precedex ordered PRN anxiety / panic attacks while on BIPAP.        Kerry Kass Narayan Scull 12/11/2019, 11:03 PM

## 2019-12-11 NOTE — Evaluation (Signed)
Clinical/Bedside Swallow Evaluation Patient Details  Name: [name] Bean MRN: [id number] Date of Birth: 1950-06-17  Today's Date: 12/11/2019 Time: SLP Start Time (ACUTE ONLY): 0947 SLP Stop Time (ACUTE ONLY): 0958 SLP Time Calculation (min) (ACUTE ONLY): 15 min  Past Medical History:  Past Medical History:  Diagnosis Date  . Anemia   . Anxiety   . Arthritis   . Asthma   . Broken arm    left arm  . Bronchitis   . Bursitis of left hip   . CAD (coronary artery disease)   . Candidiasis, vagina   . Cardiac arrest ([place]) 06/20/2016   at [facility]; ? due to flash pulm edema vs undertreated chronic CHF  . Cerumen impaction   . CHF (congestive heart failure) ([place])    RECENT ADMIT TO [facility]   . Colon, diverticulosis   . Depression   . Diabetes mellitus    15 YRS AGO  . Fatigue   . Gastroenteritis   . Hot flashes, menopausal   . Hyperlipidemia   . Knee pain, left   . Lumbar back pain   . Maxillary sinusitis    history  . Muscle tear    right gluteus  . Nausea   . Nicotine dependence   . OSA (obstructive sleep apnea) 10/16/2017  . Other B-complex deficiencies   . Otitis media, acute    left  . Peripheral neuropathy   . Spinal stenosis of lumbar region   . Tubulovillous adenoma of colon   . Unspecified essential hypertension    Past Surgical History:  Past Surgical History:  Procedure Laterality Date  . CARDIAC CATHETERIZATION  2015, 2009, 2006  . CERVICAL SPINE SURGERY  2003  . CORONARY ANGIOPLASTY WITH STENT PLACEMENT  2002, 2015  . DILATATION & CURETTAGE/HYSTEROSCOPY WITH MYOSURE N/A 10/03/2019   Procedure: DILATATION & CURETTAGE/HYSTEROSCOPY WITH MYOSURE;  Surgeon: [name], MD;  Location: [place];  Service: Gynecology;  Laterality: N/A;  request 7:30am OR time in [facility] requests 30 minutes OR time  . EYE SURGERY     cataracts bilaterally  . POSTERIOR CERVICAL LAMINECTOMY N/A 10/23/2016   Procedure: POSTERIOR CERVICAL  LAMINECTOMY MULTI LEVEL CERVICAL TWO- CERVICAL THREE, CERVICAL THREE- CERVICAL FOUR;  Surgeon: Ashok Pall, MD;  Location: [place];  Service: Neurosurgery;  Laterality: N/A;  POSTERIOR   HPI:  Pt is a65 y.o.female with PMH significant for HTN, HLD, DM, CHF, cardiac arrest, CAD, and anxiety who presented to the ED s/p cardiac arrest. Per report patient called EMS with acute complaints of SOB and found unresponsive in PEA arrest. EMS provided 3 rounds of CPR and obtained ROSC prior to ED arrival. No ACLS medications were given. ETT 8/24-8/26. CXR 8/30: Stable probable component of interstitial edema and areas of bilateral atelectasis. Possible small bilateral pleural effusions. Per order, RN swallow eval completed but pt persistently coughing with swallowing trials on 8/29   Assessment / Plan / Recommendation Clinical Impression  Pt was seen for bedside swallow evaluation. She reported that she consumes a regular texture diet and thin liquids. She initially denied any symptoms of dyspahgia but subsequently stated that she has been demonstrating coughing and throat clearing with thin liquids for at least a month. Oral mechanism exam was [facility]. Dentition was limited to mandibular teeth; she stated that she wears maxillary dentures, but that they are not present at the hospital. She exhibited throat clearing and coughing with thin liquids and with full-tsp boluses of puree. Secondary swallows were noted with  larger boluses of puree, suggesting pharyngeal residue. A modified barium swallow study is recommended to further assess swallow function. Pt may have ice chips following oral care and critical meds may be crushed and given with puree; however, diet recommendations will be deferred until the swallow study is conducted.  SLP Visit Diagnosis: Dysphagia, unspecified (R13.10)    Aspiration Risk  Mild aspiration risk    Diet Recommendation NPO;Ice chips PRN after oral care   Medication Administration: Via  alternative means    Other  Recommendations Oral Care Recommendations: Oral care QID   Follow up Recommendations  (TBD)      Frequency and Duration min 2x/week  2 weeks       Prognosis Prognosis for Safe Diet Advancement: Good Barriers to Reach Goals: Time post onset      Swallow Study   General Date of Onset: 12/10/19 HPI: Pt is a34 y.o.female with PMH significant for HTN, HLD, DM, CHF, cardiac arrest, CAD, and anxiety who presented to the ED s/p cardiac arrest. Per report patient called EMS with acute complaints of SOB and found unresponsive in PEA arrest. EMS provided 3 rounds of CPR and obtained ROSC prior to ED arrival. No ACLS medications were given. ETT 8/24-8/26. CXR 8/30: Stable probable component of interstitial edema and areas of bilateral atelectasis. Possible small bilateral pleural effusions. Per order, RN swallow eval completed but pt persistently coughing with swallowing trials on 8/29 Type of Study: Bedside Swallow Evaluation Previous Swallow Assessment: None Diet Prior to this Study: NPO (for swallow evaluation per RN) Temperature Spikes Noted: No Respiratory Status: Nasal cannula History of Recent Intubation: Yes Length of Intubations (days): 2 days Date extubated: 12/07/19 Behavior/Cognition: Alert;Cooperative;Pleasant mood Oral Cavity Assessment: Within Functional Limits Oral Care Completed by SLP: Recent completion by staff Oral Cavity - Dentition:  (Absnet maxillary dentition; adequate mandibular) Vision: Functional for self-feeding Self-Feeding Abilities: Needs assist Patient Positioning: Upright in bed Baseline Vocal Quality: Normal Volitional Cough: Strong Volitional Swallow: Able to elicit    Oral/Motor/Sensory Function Overall Oral Motor/Sensory Function: Within functional limits   Ice Chips Ice chips: Within functional limits Presentation: Spoon   Thin Liquid Thin Liquid: Impaired Presentation: Cup;Straw;Self Fed Pharyngeal  Phase  Impairments: Throat Clearing - Immediate;Cough - Delayed    Nectar Thick Nectar Thick Liquid: Not tested   Honey Thick Honey Thick Liquid: Not tested   Puree Puree: Impaired Presentation: Spoon;Self Fed Pharyngeal Phase Impairments: Throat Clearing - Immediate;Throat Clearing - Delayed;Multiple swallows   Solid     Solid: Within functional limits     Katie Faraone I. Hardin Negus, [place], [place] Office number [phone] Pager [place] 12/11/2019,10:59 AM

## 2019-12-11 NOTE — Progress Notes (Signed)
ANTICOAGULATION CONSULT NOTE  Pharmacy Consult for heparin Indication: chest pain/ACS  Patient Measurements: Height: 5\' 2"  (157.5 cm) Weight: 84.5 kg (186 lb 4.6 oz) IBW/kg (Calculated) : 50.1 Heparin Dosing Weight: 72.4 kg  Vital Signs: Temp: 97.5 F (36.4 C) (08/30 2000) Temp Source: Oral (08/30 2000) BP: 176/78 (08/30 2148) Pulse Rate: 106 (08/30 2148)  Labs: Recent Labs    12/09/19 0521 12/09/19 0521 12/09/19 1653 12/10/19 0450 12/11/19 0353 12/11/19 2104  HGB 9.1*   < >  --  8.3* 9.0*  --   HCT 28.9*  --   --  26.7* 28.0*  --   PLT 142*  --   --  142* 183  --   HEPARINUNFRC 0.34   < >  --  0.29* 0.24* 0.22*  CREATININE 1.38*   < > 1.20* 1.10* 1.14*  --    < > = values in this interval not displayed.    Estimated Creatinine Clearance: 48.3 mL/min (A) (by C-G formula based on SCr of 1.14 mg/dL (H)).  Assessment: 69 YO female presenting s/p PEA arrest. Patient called EMS with acute complaints of shortness of breath and was found unresponsive in PEA arrest.  EMS provided 3 rounds of CPR and obtained ROSC prior to ED arrival.  Patient does have a history of PEA arrest in 2018.  No anticoagulation PTA.  Pharmacy was consulted to start heparin to rule out ACS.  Heparin level still remains subtherapeutic at 0.22, on 1250 units/hr. Hgb 9, plt 183 this morning. No s/sx of bleeding or infusion issues per RN.  Goal of Therapy:  Heparin level 0.3-0.7 units/ml Monitor platelets by anticoagulation protocol: Yes   Plan:  -Increase IV heparin to 1400 units/hr. -F/u 6 hour heparin level -F/u daily CBC  -F/u plans for cath lab  Antonietta Jewel, PharmD, [place] Pharmacist  Phone: [phone] 12/11/2019 10:08 PM  Please check AMION for all [place] phone numbers After 10:00 PM, call [place] [phone]

## 2019-12-11 NOTE — Progress Notes (Signed)
ANTICOAGULATION CONSULT NOTE  Pharmacy Consult for heparin Indication: chest pain/ACS  Patient Measurements: Height: 5\' 2"  (157.5 cm) Weight: 84.5 kg (186 lb 4.6 oz) IBW/kg (Calculated) : 50.1 Heparin Dosing Weight: 72.4 kg  Vital Signs: Temp: 98.4 F (36.9 C) (08/30 1527) Temp Source: Oral (08/30 1527) BP: 160/69 (08/30 0800) Pulse Rate: 106 (08/30 0800)  Labs: Recent Labs    12/09/19 0521 12/09/19 0521 12/09/19 1653 12/10/19 0450 12/11/19 0353  HGB 9.1*   < >  --  8.3* 9.0*  HCT 28.9*  --   --  26.7* 28.0*  PLT 142*  --   --  142* 183  HEPARINUNFRC 0.34  --   --  0.29* 0.24*  CREATININE 1.38*   < > 1.20* 1.10* 1.14*   < > = values in this interval not displayed.    Estimated Creatinine Clearance: 48.3 mL/min (A) (by C-G formula based on SCr of 1.14 mg/dL (H)).  Assessment: 69 YO female presenting s/p PEA arrest. Patient called EMS with acute complaints of shortness of breath and was found unresponsive in PEA arrest.  EMS provided 3 rounds of CPR and obtained ROSC prior to ED arrival.  Patient does have a history of PEA arrest in 2018.  No anticoagulation PTA.  Pharmacy was consulted to start heparin to rule out ACS.  Heparin level still subtherapeutic despite rate increase. No bleeding noted.  Hgb stable 8-9's. Pltc ok.  No overt bleeding or complications noted.  Goal of Therapy:  Heparin level 0.3-0.7 units/ml Monitor platelets by anticoagulation protocol: Yes   Plan:  - Increase IV heparin to 1250 units/hr. - F/u 6 hour heparin level - F/u daily CBC  - F/u plans for cath lab  Dimple Nanas, PharmD PGY-1 Acute Care Pharmacy Resident 12/11/2019 3:34 PM   [facility] pharmacy phone numbers are listed on [email or web address]

## 2019-12-11 NOTE — Progress Notes (Signed)
NAME:  [name] Bean, MRN:  [id number], DOB:  Feb 16, 1951, LOS: 6 ADMISSION DATE:  11/25/2019, CONSULTATION DATE:  12/04/2019 REFERRING MD:  Ralene Bathe - EM , CHIEF COMPLAINT:  Respiratory arrest, hypoxemia   Brief History   69yo female presented to ED after PEA cardiac arrest likely due to respiratory failure.  History of present illness   [name] Bean is a 69 y.o. with PHX significant for HTN, HLD, DM, CHF, cardiac arrest, CAD, and anxiety who presented to ED s/p cardiac arrest. Per report patient called EMS with acute complaints of SOB and on arrival EMS found patient unresponsive in PEA arrest. EMS provided 3 rounds of CPR and obtained ROSC prior to ED arrival. No ACLS medications were given. PCCM called urgently to evaluate pt in ED due to hypoxemia.   CXR with bilateral ASD, concerning for possible ARDS. ABG 7.2/52/48 Labs acquired in ED are hemolyzed, resent but unavailable for review   Past Medical History   Diastolic heart failure HTN DM Prior PEA arrest CAD   Significant Hospital Events   8/24 BIB Ems for CC SOB. On EMS arrival, pulseless and hypoxemic. ROSC achieved. PCCM consulted for hypoxemia shortly after intubation. CVC and Aline placed by PCCM in ED 8/25 [facility] w/ much improved aeration. Able to start weaning PEEP/FIO2 down to 10 from 14 and FIO2 down from 100% to 70%. Transitioning OFF insulin gtt. Transitioning from Fent/propofol to precedex.  8/26: Seen by cardiology on the 25th for non-ST elevation MI.  Started on IV heparin and aspirin on the 25th.  Past spontaneous breathing trial on the morning of 8/26 and was extubated.  Did have some fairly significant hypertension post extubation-->progressed to rapid resp failure and subsequent PEA arrest. Re intubated. Time to ROSC 9 minutes.  8/27: More awake.  Weaning PEEP and FiO2.  Continuing to diurese and maximize hemodynamics not ready for extubation Consults:    Procedures:  8/24 ETT>8/26>>> 8/24  CVC> 8/24 Aline>  Significant Diagnostic Tests:  8/24 CXR> Diffuse bilateral pulmonary opacities. ETT 2.5cm above carina.  8/24 CTA chest>>1. No CT evidence of pulmonary embolism.2. Small bilateral pleural effusions with findings of pulmonary edema or ARDS.3. Clusters of airspace consolidation primarily involving the left lower lobe as well as upper lobes may represent pneumonia. Clinical correlation recommended.4. Aortic Atherosclerosis (ICD10-I70.0). 8/24 CT H >> negative  ECHO 8/24: Left Ventricle: Endocardial border definition is poor but systolic  function appears to be grossly normal. Cannot rule out mild septal  hypokinesis. Left ventricular ejection fraction, by estimation, is 55 to 60%. The left ventricle has normal function. The left ventricle has no regional wall motion abnormalities. Definity contrast agent was given IV to delineate the left ventricular endocardial borders. The left ventricular internal cavity size was normal in size. There is no left ventricular hypertrophy. Left ventricular diastolic parameters are indeterminate.  Micro Data:  8/24 SARS Cov2> neg 8/24 RVP >> 8/24 Tracheal aspirate >>klebsiella 8/24 BCx>>   Antimicrobials:  Unasyn 8/24 >>>8/29 Ceftriaxone 8/29>>  Interim history/subjective:   Extubated yesterday. Seen by SLP, plans for MBSS  Objective   Blood pressure (!) 160/69, pulse (!) 106, temperature 98.8 F (37.1 C), temperature source Oral, resp. rate (!) 22, height 5\' 2"  (1.575 m), weight 84.5 kg, SpO2 (!) 87 %. CVP:  [5 mmHg-14 mmHg] 10 mmHg  Vent Mode: BIPAP FiO2 (%):  [60 %] 60 % PEEP:  [6 cmH20] 6 cmH20 Pressure Support:  [6 cmH20] 6 cmH20   Intake/Output Summary (Last 24 hours)  at 12/11/2019 1253 Last data filed at 12/11/2019 0800 Gross per 24 hour  Intake 339.43 ml  Output 2090 ml  Net -1750.57 ml   Filed Weights   12/09/19 0500 12/10/19 0400 12/11/19 0500  Weight: 86.9 kg 91.5 kg 84.5 kg    Examination:  GEN: no acute  distress HEENT: MMM, trachea midline CV: RRR, ext warm PULM: rhonci bilaterally, no accessory muscle use GI: Soft, +BS EXT: No edema NEURO: Moves all 4 ext to command PSYCH: Mildly anxious SKIN: No rashes  Sodium up slightly Cr stable Net neg CBG okay Echo preserved  CXR still some volume overload  Resolved Hospital Problem list   PEA cardiac arrest: Felt to be secondary to respiratory failure with acute pulmonary edema  lactic acidosis status post cardiac arrest Circulatory shock: Status post cardiac arrest felt to be medication related with possible cardiogenic component plus/minus sepsis: Resolved as of 8/26 Recurrent PEA arrest (second arrest this admit) w/ subsequent cardiogenic shock  2/2 flash Pulmonary edema and subsequent acute respiratory failure Assessment & Plan:    Acute diastolic HF w/ Pulmonary edema, with non-ST elevation MI -had graduated from Dr. B HF clinic, April 2021 -- EF 60%; still 55-60% w/ possible mild septal hypokinesis; RV fxn nml, RV size NML,  Hx HTN -IV heparin -Continue aspirin, Crestor, beta blocker - Diuresis as tolerated by renal function - Increase afterload reduction with hydralazine, switch metoprolol to coreg, having multiple IV PRNs to maintain SBP < 180 -LHC if can remain stable from respiratory standpoint  Recurrent Acute respiratory failure with hypoxemia in the setting of diffuse pulmonary edema Question possible aspiration pneumonia Klebsiella PNA -Ceftriaxone to finish 8 days total therapy. -IS, progressive mobility -BIPAP at night and PRN  Fluid and electrolyte imbalance: Intermittent Plan Follow BMP Replace electrolytes if indicated  Mild AKI Current fluid balance is -3.9 L total - Continue cautious diuresis  DM w/ hyperglycemia  - Any sliding-scale insulin with current Levemir dosing - Blood glucose goal 140-180  Dysphagia- MBSS planned  Best practice:  Diet: pending MBSS Pain/Anxiety/Delirium protocol (if  indicated): wean VAP protocol (if indicated): off  DVT prophylaxis: SQ Heparin GI prophylaxis: PPI  Glucose control: SSI  Mobility: progressive mobility Code Status: Full  Family Communication: updated patient Disposition: ICU pending stability in resp status  The patient is critically ill with multiple organ systems failure and requires high complexity decision making for assessment and support, frequent evaluation and titration of therapies, application of advanced monitoring technologies and extensive interpretation of multiple databases. Critical Care Time devoted to patient care services described in this note independent of APP/resident time (if applicable)  is 34 minutes.   Erskine Emery MD [facility] 12/11/2019 2:10 PM Personal pager: [phone] If unanswered, please page CCM On-call: [phone]

## 2019-12-11 NOTE — Progress Notes (Signed)
Patient spo2 decreased to 85% with increased WOB. RT placed patient on BIPAP at this time with fio2 60%. Patient is tolerating BIPAP well at this time. Spo2 increased to 95%, RR 26. RT will continue to monitor as needed.

## 2019-12-11 NOTE — Progress Notes (Signed)
°  Speech Language Pathology Treatment: Dysphagia  Patient Details Name: [name] Bean MRN: [id number] DOB: 1951-02-26 Today's Date: 12/11/2019 Time: 4599-7741 SLP Time Calculation (min) (ACUTE ONLY): 17 min  Assessment / Plan / Recommendation Clinical Impression  Pt was seen for dysphagia treatment. She was alert and cooperative and was ordering her meals when the SLP arrived. Pt was educated regarding the results of the modified barium swallow study, diet recommendations, and swallowing precautions. Video recording of the study was used to facilitate education and she verbalized understanding. Pt was educated regarding the purpose and demonstration of dysphagia exercises. She verbalized understanding completed effortful swallows with intermittent cues. All of her questions were answered and she expressed "Bean'll try it" regarding the diet but indicated that she typically does not eat much; she stated that she only eats one meal a day.    HPI HPI: Pt is a37 y.o.female with PMH significant for HTN, HLD, DM, CHF, cardiac arrest, CAD, posterior cervical laminectomy 2018, and anxiety who presented to the ED s/p cardiac arrest. Per report patient called EMS with acute complaints of SOB and found unresponsive in PEA arrest. EMS provided 3 rounds of CPR and obtained ROSC prior to ED arrival. No ACLS medications were given. ETT 8/24-8/26. CXR 8/30: Stable probable component of interstitial edema and areas of bilateral atelectasis. Possible small bilateral pleural effusions. Per order, RN swallow eval completed but pt persistently coughing with swallowing trials on 8/29       SLP Plan  Continue with current plan of care       Recommendations  Diet recommendations: Dysphagia 2 (fine chop);Honey-thick liquid Liquids provided via: Cup;Straw Medication Administration: Whole meds with liquid Supervision: Patient able to self feed Compensations: Slow rate;Small sips/bites;Follow solids with  liquid;Clear throat intermittently Postural Changes and/or Swallow Maneuvers: Seated upright 90 degrees;Upright 30-60 min after meal                Oral Care Recommendations: Oral care QID Follow up Recommendations: Home health SLP (TBD) SLP Visit Diagnosis: Dysphagia, pharyngeal phase (R13.13) Plan: Continue with current plan of care       [name] Bean. [name] Bean, [place], [place] Office number [phone] Pager [place] 12/11/2019, 4:39 PM

## 2019-12-11 NOTE — Progress Notes (Signed)
Patient shortly after being placed on BiPAP, had a increased respiratory rate, O2 decreased to the 70s, she was moving around in the bed, restless, asked for medication for her "nerves," and had removed the bipap mask. Nasal cannula placed back on, pulled up in bed at 90 degree angle, and prn labetalol given to decrease elevated blood pressure and heart rate. CCM on call MD notified via [place].

## 2019-12-11 NOTE — Progress Notes (Signed)
eLink Physician-Brief Progress Note Patient Name: Makalynn Berwanger DOB: [birth year] MRN: [id number]   Date of Service  12/11/2019  HPI/Events of Note  RN wishes to discontinue A-line since NIBP measurements correlate well and patient continues to improve.   eICU Interventions  OK to discontinue A-line.     Intervention Category Minor Interventions: Routine modifications to care plan (e.g. PRN medications for pain, fever)  Marily Lente Elanore Talcott 12/11/2019, 1:23 AM

## 2019-12-11 NOTE — Progress Notes (Signed)
[facility] ADULT ICU REPLACEMENT PROTOCOL   The patient does apply for the [facility] Adult ICU Electrolyte Replacment Protocol based on the criteria listed below:   1. Is GFR >/= 30 ml/min? Yes.    Patient's GFR today is 58 2. Is SCr </= 2? Yes.   Patient's SCr is 1.14 ml/kg/hr 3. Did SCr increase >/= 0.5 in 24 hours? No. 4. Abnormal electrolyte(s): k 3.0 5. Ordered repletion with: protocol 6. If a panic level lab has been reported, has the CCM MD in charge been notified? No..   Physician:    Ronda Fairly A 12/11/2019 6:39 AM

## 2019-12-11 NOTE — Progress Notes (Signed)
Patient having a good night until approx 3:30 AM.  She reports to being short of breath, sats are in mid 80's and BP and HR have increased.  Increased O2 to 6L and called Rt to collaborate.  HR increased by greater than 20bpm and BP over 160 on a-line. PRN labetalol given and stayed with patient.  She reports to sleeping on CPAP at home.   After PRN labetalol patient HR slows and BP decreases, patient reports some relief but sats still hanging in high 80's.  Once Bipap initiated patient reports immediate relief.    Richardean Canal RN, BSN, CCRN

## 2019-12-11 NOTE — Progress Notes (Signed)
Progress Note  Patient Name: [name] Bean Date of Encounter: 12/11/2019  Advanced Heart Failure Cardiologist: Linna Hoff Bensimhon  Subjective   Mild SOB this am. Wants to sit in the chair.   Inpatient Medications    Scheduled Meds:  aspirin  81 mg Oral Daily   Chlorhexidine Gluconate Cloth  6 each Topical Daily   furosemide  40 mg Intravenous Daily   insulin aspart  3-9 Units Subcutaneous Q4H   insulin detemir  12 Units Subcutaneous Q12H   metoprolol tartrate  12.5 mg Oral BID   potassium chloride  20 mEq Oral Q4H   rosuvastatin  20 mg Oral Daily   sertraline  50 mg Oral Daily   sodium chloride flush  10 mL Intracatheter Q12H   Continuous Infusions:  sodium chloride 500 mL (12/11/19 0728)   heparin 1,100 Units/hr (12/11/19 0700)   potassium chloride 10 mEq (12/11/19 0732)   PRN Meds: sodium chloride, acetaminophen, labetalol, ondansetron (ZOFRAN) IV   Vital Signs    Vitals:   12/11/19 0649 12/11/19 0656 12/11/19 0700 12/11/19 0721  BP:   (!) 143/62   Pulse: (!) 101 92 90   Resp: (!) 30 (!) 29 (!) 29   Temp:    99.9 F (37.7 C)  TempSrc:    Oral  SpO2: 96% (!) 88% (!) 89%   Weight:      Height:        Intake/Output Summary (Last 24 hours) at 12/11/2019 0821 Last data filed at 12/11/2019 0700 Gross per 24 hour  Intake 766.68 ml  Output 2190 ml  Net -1423.32 ml   Filed Weights   12/09/19 0500 12/10/19 0400 12/11/19 0500  Weight: 86.9 kg 91.5 kg 84.5 kg    Telemetry    Sinus-Personally reviewed.  ECG    No AM EKG  Physical Exam   General: Well developed, well nourished, NAD  SKIN: warm, dry. No rashes. Neuro: No focal deficits  Musculoskeletal: Muscle strength 5/5 all ext  Psychiatric: Mood and affect normal  Neck: No JVD Lungs:Clear bilaterally, no wheezes, rhonci, crackles Cardiovascular: Regular rate and rhythm. No murmurs, gallops or rubs. Abdomen:Soft.  Extremities: No lower extremity edema.   Labs     Chemistry Recent Labs  Lab 12/06/19 1605 12/06/19 1605 12/07/19 0304 12/07/19 0315 12/07/19 0928 12/07/19 1138 12/09/19 1653 12/10/19 0450 12/11/19 0353  NA 140   < > 140   < > 142   < > 145 145 147*  K 3.8   < > 3.7   < > 3.7   < > 4.1 3.8 3.0*  CL 107   < > 108   < > 105   < > 107 109 107  CO2 21*   < > 22   < > 20*   < > 27 30 26   GLUCOSE 219*   < > 153*   < > 320*   < > 255* 214* 141*  BUN 13   < > 13   < > 15   < > 35* 28* 28*  CREATININE 0.95   < > 0.90   < > 1.20*   < > 1.20* 1.10* 1.14*  CALCIUM 8.1*   < > 8.0*   < > 8.2*   < > 8.7* 8.4* 8.7*  PROT 6.0*  --  5.9*  --  6.2*  --   --   --   --   ALBUMIN 2.8*  --  2.7*  --  2.7*  --   --   --   --  AST 59*  --  56*  --  275*  --   --   --   --   ALT 24  --  24  --  214*  --   --   --   --   ALKPHOS 84  --  79  --  121  --   --   --   --   BILITOT 0.7  --  1.0  --  0.8  --   --   --   --   GFRNONAA >60   < > >60   < > 47*   < > 47* 52* 50*  GFRAA >60   < > >60   < > 54*   < > 54* >60 58*  ANIONGAP 12   < > 10   < > 17*   < > 11 6 14    < > = values in this interval not displayed.     Hematology Recent Labs  Lab 12/09/19 0521 12/10/19 0450 12/11/19 0353  WBC 11.2* 11.2* 8.7  RBC 3.01* 2.71* 2.80*  HGB 9.1* 8.3* 9.0*  HCT 28.9* 26.7* 28.0*  MCV 96.0 98.5 100.0  MCH 30.2 30.6 32.1  MCHC 31.5 31.1 32.1  RDW 12.8 12.9 12.8  PLT 142* 142* 183    Cardiac Enzymes Recent Labs  Lab 12/09/2019 1530 11/25/2019 2044 12/06/19 1605 12/07/19 0928  TROPONINIHS 293* 8,932* 6,658* 2,702*    BNP Recent Labs  Lab 12/08/19 0324 12/09/19 0521 12/10/19 0450  BNP 228.5* 136.4* 611.7*     Radiology    DG Chest Port 1 View  Result Date: 12/11/2019 CLINICAL DATA:  Cardiac arrest and respiratory failure. EXAM: PORTABLE CHEST 1 VIEW COMPARISON:  12/10/2019 FINDINGS: Endotracheal tube and gastric decompression tube have been removed. Central line shows stable positioning with catheter tip in the SVC. The heart size is  stable. Stable probable component of underlying interstitial edema and areas of bilateral atelectasis. There may be small bilateral pleural effusions. No pneumothorax. IMPRESSION: Status post extubation. Stable probable component of interstitial edema and areas of bilateral atelectasis. Possible small bilateral pleural effusions. Electronically Signed   By: Aletta Edouard M.D.   On: 12/11/2019 08:10   DG Chest Port 1 View  Result Date: 12/10/2019 CLINICAL DATA:  Evaluate for diffuse bilateral pulmonary infiltrates/edema. EXAM: PORTABLE CHEST 1 VIEW COMPARISON:  December 08, 2019 FINDINGS: Stable cardiomegaly. The hila and mediastinum are unchanged. The ETT is in good position. The left central line terminates in the SVC. The OG tube terminates below today's film. No pneumothorax. Mild patchy bilateral pulmonary opacities. No other acute abnormalities. IMPRESSION: 1. Support apparatus as above. 2. Bilateral patchy pulmonary opacities could represent multifocal pneumonia or asymmetric edema. Recommend clinical correlation. Electronically Signed   By: Dorise Bullion III M.D   On: 12/10/2019 10:41    Cardiac Studies   Echocardiogram 11/19/2019: 1. Endocardial border definition is poor but systolic function appears to  be grossly normal. Cannot rule out mild septal hypokinesis. Left  ventricular ejection fraction, by estimation, is 55 to 60%. The left  ventricle has normal function. The left  ventricle has no regional wall motion abnormalities. Left ventricular  diastolic parameters are indeterminate.  2. Right ventricular systolic function is normal. The right ventricular  size is normal. There is mildly elevated pulmonary artery systolic  pressure.  3. The mitral valve is normal in structure. No evidence of mitral valve  regurgitation. No evidence of mitral stenosis.  4. The aortic  valve is normal in structure. Aortic valve regurgitation is  not visualized. No aortic stenosis is present.  5.  The inferior vena cava is normal in size with <50% respiratory  variability, suggesting right atrial pressure of 8 mmHg.   Patient Profile     69 y.o. female with a history of cardiac arrest 2018 secondary to pulmonary edema, CAD s/p stentto RCA 2002 and distal LAD 6314, chronic diastolic heart failure, HTN, Hyperlipidemia, spinal stenosis, tobacco use anddiabetiswho is being seen for the evaluation ofNSTEMI. She was admitted with respiratory failure, PEA arrest. 3 rounds of CPR by EMS with ROSC.   Assessment & Plan    1.  NSTEMI: History of CAD. Elevation in troponin post cardiac arrest. Peak high-sensitivity troponin I of 6658.  New left bundle branch block by ECG. Echocardiogram reveals LVEF 55 to 60% range, possibly mild septal hypokinesis, normal RV contraction. No chest pain. She is hemodynamically stable.  -Continue ASA, statin and beta blocker.  -Cardiac cath later this week once respiratory status is more stable. We will follow along  2. Cardiac arrest: Status post PEA arrest, possibly secondary to pulmonary edema given similar history in the past.  Now extubated and appropriate neurologically.   3. Acute on chronic diastolic CHF: BNP elevated yesterday. Chest x-ray today with mild pulmonary edema, bilateral pleural effusions. Negative 3.8 liters since admission.  -Continue IV Lasix today.   Signed, Lauree Chandler, MD  12/11/2019, 8:21 AM

## 2019-12-12 ENCOUNTER — Encounter: Payer: Medicare PPO | Admitting: Physical Therapy

## 2019-12-12 ENCOUNTER — Inpatient Hospital Stay (HOSPITAL_COMMUNITY): Payer: Medicare PPO

## 2019-12-12 DIAGNOSIS — J9621 Acute and chronic respiratory failure with hypoxia: Secondary | ICD-10-CM

## 2019-12-12 LAB — BASIC METABOLIC PANEL
Anion gap: 12 (ref 5–15)
Anion gap: 14 (ref 5–15)
BUN: 23 mg/dL (ref 8–23)
BUN: 26 mg/dL — ABNORMAL HIGH (ref 8–23)
CO2: 28 mmol/L (ref 22–32)
CO2: 26 mmol/L (ref 22–32)
Calcium: 8.9 mg/dL (ref 8.9–10.3)
Calcium: 8.6 mg/dL — ABNORMAL LOW (ref 8.9–10.3)
Chloride: 110 mmol/L (ref 98–111)
Chloride: 108 mmol/L (ref 98–111)
Creatinine, Ser: 1 mg/dL (ref 0.44–1.00)
Creatinine, Ser: 1.18 mg/dL — ABNORMAL HIGH (ref 0.44–1.00)
GFR calc Af Amer: >60 mL/min (ref >60)
GFR calc Af Amer: 55 mL/min — ABNORMAL LOW (ref >60)
GFR calc non Af Amer: 58 mL/min — ABNORMAL LOW (ref >60)
GFR calc non Af Amer: 48 mL/min — ABNORMAL LOW (ref >60)
Glucose, Bld: 108 mg/dL — ABNORMAL HIGH (ref 70–99)
Glucose, Bld: 248 mg/dL — ABNORMAL HIGH (ref 70–99)
Potassium: 3.2 mmol/L — ABNORMAL LOW (ref 3.5–5.1)
Potassium: 4.2 mmol/L (ref 3.5–5.1)
Sodium: 150 mmol/L — ABNORMAL HIGH (ref 135–145)
Sodium: 148 mmol/L — ABNORMAL HIGH (ref 135–145)

## 2019-12-12 LAB — CBC
HCT: 24.7 % — ABNORMAL LOW (ref 36.0–46.0)
Hemoglobin: 8.5 g/dL — ABNORMAL LOW (ref 12.0–15.0)
MCH: 34.4 pg — ABNORMAL HIGH (ref 26.0–34.0)
MCHC: 34.4 g/dL (ref 30.0–36.0)
MCV: 100 fL (ref 80.0–100.0)
Platelets: 249 10*3/uL (ref 150–400)
RBC: 2.47 MIL/uL — ABNORMAL LOW (ref 3.87–5.11)
RDW: 13.1 % (ref 11.5–15.5)
WBC: 12.3 10*3/uL — ABNORMAL HIGH (ref 4.0–10.5)
nRBC: 0.2 % (ref 0.0–0.2)

## 2019-12-12 LAB — GLUCOSE, CAPILLARY
Glucose-Capillary: 100 mg/dL — ABNORMAL HIGH (ref 70–99)
Glucose-Capillary: 104 mg/dL — ABNORMAL HIGH (ref 70–99)
Glucose-Capillary: 126 mg/dL — ABNORMAL HIGH (ref 70–99)
Glucose-Capillary: 141 mg/dL — ABNORMAL HIGH (ref 70–99)
Glucose-Capillary: 151 mg/dL — ABNORMAL HIGH (ref 70–99)
Glucose-Capillary: 250 mg/dL — ABNORMAL HIGH (ref 70–99)

## 2019-12-12 LAB — MAGNESIUM
Magnesium: 2 mg/dL (ref 1.7–2.4)
Magnesium: 2 mg/dL (ref 1.7–2.4)

## 2019-12-12 LAB — HEMOGLOBIN A1C
Hgb A1c MFr Bld: 7.9 % — ABNORMAL HIGH (ref 4.8–5.6)
Mean Plasma Glucose: 180.03 mg/dL

## 2019-12-12 LAB — ECHOCARDIOGRAM LIMITED
Area-P 1/2: 2.99 cm2
Calc EF: 47.6 %
Height: 62 in
Single Plane A2C EF: 53.9 %
Single Plane A4C EF: 36.6 %
Weight: 2980.62 oz

## 2019-12-12 LAB — COOXEMETRY PANEL
Carboxyhemoglobin: 1.6 % — ABNORMAL HIGH (ref 0.5–1.5)
Methemoglobin: 0.9 % (ref 0.0–1.5)
O2 Saturation: 45.3 %
Total hemoglobin: 8.9 g/dL — ABNORMAL LOW (ref 12.0–16.0)

## 2019-12-12 LAB — HEPARIN LEVEL (UNFRACTIONATED)
Heparin Unfractionated: 0.41 IU/mL (ref 0.30–0.70)
Heparin Unfractionated: 0.33 IU/mL (ref 0.30–0.70)

## 2019-12-12 LAB — BODY FLUID CELL COUNT WITH DIFFERENTIAL
Eos, Fluid: 0 %
Lymphs, Fluid: 3 %
Monocyte-Macrophage-Serous Fluid: 1 % — ABNORMAL LOW (ref 50–90)
Neutrophil Count, Fluid: 96 % — ABNORMAL HIGH (ref 0–25)
Total Nucleated Cell Count, Fluid: 860 cu mm (ref 0–1000)

## 2019-12-12 LAB — POCT I-STAT 7, (LYTES, BLD GAS, ICA,H+H)
Acid-Base Excess: 6 mmol/L — ABNORMAL HIGH (ref 0.0–2.0)
Bicarbonate: 29.6 mmol/L — ABNORMAL HIGH (ref 20.0–28.0)
Calcium, Ion: 1.12 mmol/L — ABNORMAL LOW (ref 1.15–1.40)
HCT: 25 % — ABNORMAL LOW (ref 36.0–46.0)
Hemoglobin: 8.5 g/dL — ABNORMAL LOW (ref 12.0–15.0)
O2 Saturation: 96 %
Potassium: 3.7 mmol/L (ref 3.5–5.1)
Sodium: 151 mmol/L — ABNORMAL HIGH (ref 135–145)
TCO2: 31 mmol/L (ref 22–32)
pCO2 arterial: 40.7 mmHg (ref 32.0–48.0)
pH, Arterial: 7.471 — ABNORMAL HIGH (ref 7.350–7.450)
pO2, Arterial: 76 mmHg — ABNORMAL LOW (ref 83.0–108.0)

## 2019-12-12 LAB — LIPID PANEL
Cholesterol: 111 mg/dL (ref 0–200)
HDL: 28 mg/dL — ABNORMAL LOW (ref >40)
LDL Cholesterol: 51 mg/dL (ref 0–99)
Total CHOL/HDL Ratio: 4 RATIO
Triglycerides: 160 mg/dL — ABNORMAL HIGH (ref <150)
VLDL: 32 mg/dL (ref 0–40)

## 2019-12-12 LAB — PHOSPHORUS
Phosphorus: 3.6 mg/dL (ref 2.5–4.6)
Phosphorus: 2.4 mg/dL — ABNORMAL LOW (ref 2.5–4.6)

## 2019-12-12 LAB — SEDIMENTATION RATE: Sed Rate: 134 mm/hr — ABNORMAL HIGH (ref 0–22)

## 2019-12-12 LAB — TROPONIN I (HIGH SENSITIVITY): Troponin I (High Sensitivity): 196 ng/L (ref <18)

## 2019-12-12 LAB — C-REACTIVE PROTEIN: CRP: 28.5 mg/dL — ABNORMAL HIGH (ref <1.0)

## 2019-12-12 MED ORDER — LORAZEPAM 2 MG/ML IJ SOLN: 2.0000 mg | Freq: Once | INTRAMUSCULAR | Status: AC

## 2019-12-12 MED ORDER — FENTANYL BOLUS VIA INFUSION
25.0000 ug | INTRAVENOUS | Status: DC | PRN
  Filled 2019-12-12: qty 25

## 2019-12-12 MED ORDER — ETOMIDATE 2 MG/ML IV SOLN
20.0000 mg | Freq: Once | INTRAVENOUS | Status: AC
  Administered 2019-12-12: 20 mg via INTRAVENOUS

## 2019-12-12 MED ORDER — POTASSIUM CHLORIDE 10 MEQ/50ML IV SOLN
10.0000 meq | INTRAVENOUS | Status: AC
  Administered 2019-12-12 (×5): 10 meq via INTRAVENOUS
  Filled 2019-12-12 (×5): qty 50

## 2019-12-12 MED ORDER — SERTRALINE HCL 20 MG/ML PO CONC
50.0000 mg | Freq: Every day | ORAL | Status: DC
  Administered 2019-12-12: 50 mg via ORAL
  Filled 2019-12-12: qty 2.5

## 2019-12-12 MED ORDER — FENTANYL CITRATE (PF) 100 MCG/2ML IJ SOLN: 25.0000 ug | INTRAMUSCULAR | Status: DC | PRN

## 2019-12-12 MED ORDER — DEXMEDETOMIDINE HCL IN NACL 400 MCG/100ML IV SOLN
0.0000 ug/kg/h | INTRAVENOUS | Status: AC
  Administered 2019-12-12: 1 ug/kg/h via INTRAVENOUS
  Administered 2019-12-12 (×2): 1.2 ug/kg/h via INTRAVENOUS
  Administered 2019-12-12: 1 ug/kg/h via INTRAVENOUS
  Administered 2019-12-13: 0.6 ug/kg/h via INTRAVENOUS
  Administered 2019-12-13: 0.8 ug/kg/h via INTRAVENOUS
  Administered 2019-12-13: 0.4 ug/kg/h via INTRAVENOUS
  Administered 2019-12-14: 1 ug/kg/h via INTRAVENOUS
  Administered 2019-12-14: 0.5 ug/kg/h via INTRAVENOUS
  Administered 2019-12-14: 0.6 ug/kg/h via INTRAVENOUS
  Administered 2019-12-15: 1.6 ug/kg/h via INTRAVENOUS
  Administered 2019-12-15: 1.4 ug/kg/h via INTRAVENOUS
  Administered 2019-12-15: 1.3 ug/kg/h via INTRAVENOUS
  Filled 2019-12-12 (×8): qty 100
  Filled 2019-12-12: qty 200
  Filled 2019-12-12 (×4): qty 100

## 2019-12-12 MED ORDER — MIDAZOLAM HCL 2 MG/2ML IJ SOLN
1.0000 mg | Freq: Once | INTRAMUSCULAR | Status: AC
  Administered 2019-12-12: 1 mg via INTRAVENOUS

## 2019-12-12 MED ORDER — ASPIRIN 81 MG PO CHEW
81.0000 mg | CHEWABLE_TABLET | Freq: Every day | ORAL | Status: DC
  Administered 2019-12-13 – 2019-12-17 (×5): 81 mg
  Filled 2019-12-12 (×5): qty 1

## 2019-12-12 MED ORDER — LORAZEPAM 2 MG/ML IJ SOLN
INTRAMUSCULAR | Status: AC
  Administered 2019-12-12: 0.5 mg via INTRAVENOUS
  Filled 2019-12-12: qty 1

## 2019-12-12 MED ORDER — ORAL CARE MOUTH RINSE
15.0000 mL | OROMUCOSAL | Status: DC
  Administered 2019-12-12 – 2019-12-18 (×47): 15 mL via OROMUCOSAL

## 2019-12-12 MED ORDER — VITAL HIGH PROTEIN PO LIQD: 1000.0000 mL | ORAL | Status: DC

## 2019-12-12 MED ORDER — PROSOURCE TF PO LIQD
45.0000 mL | Freq: Two times a day (BID) | ORAL | Status: DC
  Administered 2019-12-12 – 2019-12-16 (×10): 45 mL
  Filled 2019-12-12 (×11): qty 45

## 2019-12-12 MED ORDER — METOLAZONE 5 MG PO TABS
5.0000 mg | ORAL_TABLET | Freq: Once | ORAL | Status: AC
  Administered 2019-12-12: 5 mg via ORAL
  Filled 2019-12-12: qty 1

## 2019-12-12 MED ORDER — CHLORHEXIDINE GLUCONATE 0.12% ORAL RINSE (MEDLINE KIT)
15.0000 mL | Freq: Two times a day (BID) | OROMUCOSAL | Status: DC
  Administered 2019-12-12 – 2019-12-17 (×11): 15 mL via OROMUCOSAL

## 2019-12-12 MED ORDER — MIDAZOLAM HCL 2 MG/2ML IJ SOLN
INTRAMUSCULAR | Status: AC
  Administered 2019-12-12: 1 mg via INTRAVENOUS
  Filled 2019-12-12: qty 2

## 2019-12-12 MED ORDER — CARVEDILOL 6.25 MG PO TABS
6.2500 mg | ORAL_TABLET | Freq: Two times a day (BID) | ORAL | Status: DC
  Administered 2019-12-12: 6.25 mg
  Filled 2019-12-12: qty 1

## 2019-12-12 MED ORDER — MIDAZOLAM HCL 2 MG/2ML IJ SOLN: 1.0000 mg | Freq: Once | INTRAMUSCULAR | Status: AC

## 2019-12-12 MED ORDER — PROSOURCE TF PO LIQD: 45.0000 mL | Freq: Two times a day (BID) | ORAL | Status: DC

## 2019-12-12 MED ORDER — NOREPINEPHRINE 4 MG/250ML-% IV SOLN
0.0000 ug/min | INTRAVENOUS | Status: DC
  Administered 2019-12-12 – 2019-12-13 (×2): 2 ug/min via INTRAVENOUS
  Administered 2019-12-15: 5 ug/min via INTRAVENOUS
  Administered 2019-12-15: 4 ug/min via INTRAVENOUS
  Administered 2019-12-16: 1 ug/min via INTRAVENOUS
  Administered 2019-12-17: 25 ug/min via INTRAVENOUS
  Administered 2019-12-17 (×2): 20 ug/min via INTRAVENOUS
  Filled 2019-12-12 (×6): qty 250
  Filled 2019-12-12: qty 500
  Filled 2019-12-12 (×2): qty 250

## 2019-12-12 MED ORDER — PROPOFOL 1000 MG/100ML IV EMUL
0.0000 ug/kg/min | INTRAVENOUS | Status: DC
  Administered 2019-12-15: 30 ug/kg/min via INTRAVENOUS
  Administered 2019-12-15: 5 ug/kg/min via INTRAVENOUS
  Administered 2019-12-15 – 2019-12-17 (×6): 30 ug/kg/min via INTRAVENOUS
  Filled 2019-12-12 (×4): qty 100
  Filled 2019-12-12: qty 200
  Filled 2019-12-12 (×3): qty 100

## 2019-12-12 MED ORDER — HYDRALAZINE HCL 25 MG PO TABS
25.0000 mg | ORAL_TABLET | Freq: Three times a day (TID) | ORAL | Status: DC
  Administered 2019-12-12: 25 mg
  Filled 2019-12-12: qty 1

## 2019-12-12 MED ORDER — SODIUM CHLORIDE 0.9 % IV SOLN
2.0000 g | Freq: Two times a day (BID) | INTRAVENOUS | Status: DC
  Administered 2019-12-12 – 2019-12-15 (×6): 2 g via INTRAVENOUS
  Filled 2019-12-12 (×7): qty 2

## 2019-12-12 MED ORDER — PERFLUTREN LIPID MICROSPHERE
1.0000 mL | INTRAVENOUS | Status: AC | PRN
  Administered 2019-12-12: 3 mL via INTRAVENOUS
  Filled 2019-12-12: qty 10

## 2019-12-12 MED ORDER — FENTANYL 2500MCG IN NS 250ML (10MCG/ML) PREMIX INFUSION
25.0000 ug/h | INTRAVENOUS | Status: DC
  Administered 2019-12-12: 25 ug/h via INTRAVENOUS
  Administered 2019-12-13: 75 ug/h via INTRAVENOUS
  Administered 2019-12-14: 25 ug/h via INTRAVENOUS
  Administered 2019-12-15: 300 ug/h via INTRAVENOUS
  Administered 2019-12-15: 400 ug/h via INTRAVENOUS
  Administered 2019-12-16: 300 ug/h via INTRAVENOUS
  Administered 2019-12-16 – 2019-12-17 (×2): 200 ug/h via INTRAVENOUS
  Filled 2019-12-12 (×10): qty 250

## 2019-12-12 MED ORDER — IPRATROPIUM-ALBUTEROL 0.5-2.5 (3) MG/3ML IN SOLN: 3.0000 mL | Freq: Four times a day (QID) | RESPIRATORY_TRACT | Status: DC | PRN

## 2019-12-12 MED ORDER — ACETAMINOPHEN 160 MG/5ML PO SOLN: 650.0000 mg | Freq: Four times a day (QID) | ORAL | Status: DC | PRN

## 2019-12-12 MED ORDER — FENTANYL CITRATE (PF) 100 MCG/2ML IJ SOLN: 50.0000 ug | Freq: Once | INTRAMUSCULAR | Status: AC

## 2019-12-12 MED ORDER — PANTOPRAZOLE SODIUM 40 MG IV SOLR
40.0000 mg | Freq: Every day | INTRAVENOUS | Status: DC
  Administered 2019-12-12 – 2019-12-17 (×6): 40 mg via INTRAVENOUS
  Filled 2019-12-12 (×6): qty 40

## 2019-12-12 MED ORDER — VITAL AF 1.2 CAL PO LIQD
1000.0000 mL | ORAL | Status: DC
  Administered 2019-12-12 – 2019-12-17 (×8): 1000 mL

## 2019-12-12 MED ORDER — ROSUVASTATIN CALCIUM 20 MG PO TABS
20.0000 mg | ORAL_TABLET | Freq: Every day | ORAL | Status: DC
  Administered 2019-12-13: 20 mg
  Filled 2019-12-12: qty 1

## 2019-12-12 MED ORDER — SERTRALINE HCL 20 MG/ML PO CONC
50.0000 mg | Freq: Every day | ORAL | Status: DC
  Administered 2019-12-13 – 2019-12-17 (×5): 50 mg
  Filled 2019-12-12 (×6): qty 2.5

## 2019-12-12 MED ORDER — FENTANYL CITRATE (PF) 100 MCG/2ML IJ SOLN
INTRAMUSCULAR | Status: AC
  Administered 2019-12-12: 50 ug via INTRAVENOUS
  Filled 2019-12-12: qty 2

## 2019-12-12 MED ORDER — FENTANYL CITRATE (PF) 100 MCG/2ML IJ SOLN
25.0000 ug | INTRAMUSCULAR | Status: DC | PRN
  Administered 2019-12-12 (×2): 50 ug via INTRAVENOUS
  Administered 2019-12-12 – 2019-12-17 (×3): 100 ug via INTRAVENOUS
  Filled 2019-12-12 (×2): qty 2

## 2019-12-12 MED ORDER — ROCURONIUM BROMIDE 50 MG/5ML IV SOLN
60.0000 mg | Freq: Once | INTRAVENOUS | Status: AC
  Administered 2019-12-12: 60 mg via INTRAVENOUS
  Filled 2019-12-12: qty 6

## 2019-12-12 NOTE — Progress Notes (Signed)
°  Echocardiogram 2D Echocardiogram limited with definity  has been performed.  [name] Bean M 12/12/2019, 12:19 PM

## 2019-12-12 NOTE — Progress Notes (Signed)
[facility] ADULT ICU REPLACEMENT PROTOCOL   The patient does apply for the [facility] Adult ICU Electrolyte Replacment Protocol based on the criteria listed below:   1. Is GFR >/= 30 ml/min? Yes.    Patient's GFR today is >60 2. Is SCr </= 2? Yes.   Patient's SCr is 1 ml/kg/hr 3. Did SCr increase >/= 0.5 in 24 hours? Yes.   4. Abnormal electrolyte(s): k 3.2 5. Ordered repletion with: protocol 6. If a panic level lab has been reported, has the CCM MD in charge been notified? No..   Physician:    Ronda Fairly A 12/12/2019 6:43 AM

## 2019-12-12 NOTE — Progress Notes (Signed)
SLP Cancellation Note  Patient Details Name: [name] Bean MRN: [id number] DOB: [birth year]   Cancelled treatment:       Reason Eval/Treat Not Completed: Medical issues which prohibited therapy. Pt is currently orally intubated. Will continue to follow for readiness to resume PO intake.  Arlow Spiers B. Quentin Ore, [facility], [place] Speech Language Pathologist Office: [phone]  Shonna Chock 12/12/2019, 12:25 PM

## 2019-12-12 NOTE — Progress Notes (Addendum)
RNs were going to turn and clean patient this morning when she began to feel short of breath and requested to sit up on the edge of the bed.  We sat her on the edge of the bed and she had some relief. Patient was placed on NRB. Her O2 sats stayed in the low 80s, accompanied by tachypnea and accessory muscle use. She could speak in full sentences and remained alert the entire time she wasn't breathing well. Precedex was turned back on for patient's anxiety and comfort.  Upon my request CCM came to bedside to assess patient. I relayed information to Dr. Tamala Julian and Rahul regarding patient's shortness of breath, anxiety, and her lung sounds being tight. I requested a breathing treatment. I also expressed concern for patient needing to void but not being able to void well and perhaps not emptying her bladder maybe causing some pulmonary edema issues. Lasix was suggested. Requested for cardiology to come see patient as well.  1 mg of Ativan given with orders to give up to  2 mg if needed.  Orders for Bipap or CPAP which ever patient would tolerate.   With the ativan and precedex, patient calmed down enough to get back to bed. On NRB sats up to 100% breathing comfortably.

## 2019-12-12 NOTE — Progress Notes (Addendum)
Nutrition Follow Up  DOCUMENTATION CODES:   Not applicable  INTERVENTION:   -Vital AF 1.2 at 50 ml/hr via OG (1200 ml) -ProSource TF 45 ml BID  Provides 1520 g of protein, 112 kcals, 973 mL of free water  NUTRITION DIAGNOSIS:   Inadequate oral intake related to acute illness as evidenced by NPO status.  Ongoing  GOAL:   Patient will meet greater than or equal to 90% of their needs   Addressed via TF  MONITOR:   Vent status, Labs, Weight trends, TF tolerance  REASON FOR ASSESSMENT:   Consult, Ventilator Enteral/tube feeding initiation and management  ASSESSMENT:   69 yo female admitted post PEA arrest requiring intubation, shock. PMH includes HTN, DM, CAD s/p PCI, CHF   8/26- extubated, re-intubated  8/29- extubated  8/30- diet advanced D2/HT  Pt discussed during ICU rounds and with RN.   Respiratory status worse overnight, required intubation this am. Concern for recurrent aspiration. May need heart cath while intubated. Okay to start feeding per CCM. OG confirmed in stomach.   Patient is currently intubated on ventilator support MV: 7.0 L/min Temp (24hrs), Avg:98.3 F (36.8 C), Min:97.5 F (36.4 C), Max:98.9 F (37.2 C)  Admission weight: 95.1 kg  Current weight: 84.5 kg   I/O: -5,402 ml since admit  UOP: 2,400 ml x 24 hrs   Drips: precedex Medications: SS novolog, levemir Labs: Na 148 (H) CBG 108-248  Diet Order:   Diet Order            Diet NPO time specified  Diet effective now                 EDUCATION NEEDS:   Not appropriate for education at this time  Skin:  Skin Assessment: Reviewed RN Assessment  Last BM:  8/30  Height:   Ht Readings from Last 1 Encounters:  12/07/19 5\' 2"  (1.575 m)    Weight:   Wt Readings from Last 1 Encounters:  12/11/19 84.5 kg    BMI:  Body mass index is 34.07 kg/m.  Estimated Nutritional Needs:   Kcal:  1435 kcal  Protein:  109-136 g  Fluid:  >/= 1.4 L/day   Mariana Single RD,  LDN Clinical Nutrition Pager listed in [place]

## 2019-12-12 NOTE — Progress Notes (Signed)
Inpatient Diabetes Program Recommendations  AACE/ADA: New Consensus Statement on Inpatient Glycemic Control (2015)  Target Ranges:  Prepandial:   less than 140 mg/dL      Peak postprandial:   less than 180 mg/dL (1-2 hours)      Critically ill patients:  140 - 180 mg/dL   Lab Results  Component Value Date   GLUCAP 250 (H) 12/12/2019   HGBA1C 7.9 (H) 12/12/2019    Review of Glycemic Control Results for [name] Bean, [name] Bean (MRN [id number]) as of 12/12/2019 13:02  Ref. Range 12/11/2019 23:24 12/12/2019 03:59 12/12/2019 06:56 12/12/2019 12:05  Glucose-Capillary Latest Ref Range: 70 - 99 mg/dL 172 (H) 126 (H) 100 (H) 250 (H)   Diabetes history: Type 2 DM Outpatient Diabetes medications: Novolog 12 units TID, Lantus 30 units QHS Current orders for Inpatient glycemic control: Levemir 12 units BID, Novolog 3-9 units Q4H  Inpatient Diabetes Program Recommendations:    Noted glucose trends have increased since this AM and Levemir was held r/t concern for lows.Verified with RN and encouraged to go ahead and give Levemir dose as ordered.    Thanks, Bronson Curb, MSN, RNC-OB Diabetes Coordinator [phone] (8a-5p)

## 2019-12-12 NOTE — Progress Notes (Signed)
Pharmacy Antibiotic Note  [name] Bean is a 69 y.o. female admitted on 11/27/2019 with pneumonia.  Pharmacy has been consulted for cefepime  dosing. Tm100.3, wbc 12, Cr 1.18, crcl 84m/min, TA with Kleb R to amp S to ancef  - concern for resistant bacteria BAL in process with GNR  Plan: Cefepime 2gm IV q12h Monitor renal function and culture results  Height: _0  (157.5 cm) Weight: 84.5 kg (186 lb 4.6 oz) IBW/kg (Calculated) : 50.1  Temp (24hrs), Avg:98.6 F (37 C), Min:97.5 F (36.4 C), Max:100.3 F (37.9 C)  Recent Labs  Lab 12/08/19 0324 12/08/19 1655 12/09/19 0521 12/09/19 0521 12/09/19 1653 12/10/19 0450 12/11/19 0353 12/12/19 0520 12/12/19 1237  WBC 14.6*  --  11.2*  --   --  11.2* 8.7 12.3*  --   CREATININE 1.37*   < > 1.38*   < > 1.20* 1.10* 1.14* 1.00 1.18*   < > = values in this interval not displayed.    Estimated Creatinine Clearance: 46.7 mL/min (A) (by C-G formula based on SCr of 1.18 mg/dL (H)).    Allergies  Allergen Reactions  . Sitagliptin Other (See Comments)    Had pancreatitis while on Januvia     Antimicrobials this admission: Ceftriaxone 8/30 >> 8/31 Unasyn 8/24 >> 8/29 for asp PNA Azithro 8/24 >> 8/24 Cefepime 8/31>  Dose adjustments this admission:   Microbiology results: 8/24 BCx: ngtd 8/28 Resp Cx: klebsiella R amp, S ancef  8/31 BAL cx: GNR    LBonnita NasutiPharm.D. CPP, BCPS Clinical Pharmacist [phone]/31/2021 6:24 PM

## 2019-12-12 NOTE — Progress Notes (Signed)
PT Cancellation Note  Patient Details Name: Jimia Gentles MRN: [id number] DOB: 05-25-1950   Cancelled Treatment:    Reason Eval/Treat Not Completed: Patient not medically ready (per RN pt with respiratory difficulty with increased WOB after dangling earlier)   Bonner Larue B Merle Cirelli 12/12/2019, 9:40 AM  Bayard Males, PT Acute Rehabilitation Services Pager: [phone] Office: [phone]

## 2019-12-12 NOTE — Progress Notes (Addendum)
NAME:  [name] Bean, MRN:  [id number], DOB:  1950/05/10, LOS: 7 ADMISSION DATE:  12/04/2019, CONSULTATION DATE:  12/04/2019 REFERRING MD:  Ralene Bathe - EM , CHIEF COMPLAINT:  Respiratory arrest, hypoxemia   Brief History   69yo female presented to ED after PEA cardiac arrest likely due to respiratory failure.  History of present illness   [name] Bean is a 69 y.o. with PHX significant for HTN, HLD, DM, CHF, cardiac arrest, CAD, and anxiety who presented to ED s/p cardiac arrest. Per report patient called EMS with acute complaints of SOB and on arrival EMS found patient unresponsive in PEA arrest. EMS provided 3 rounds of CPR and obtained ROSC prior to ED arrival. No ACLS medications were given. PCCM called urgently to evaluate pt in ED due to hypoxemia.   CXR with bilateral ASD, concerning for possible ARDS. ABG 7.2/52/48 Labs acquired in ED are hemolyzed, resent but unavailable for review   Past Medical History   Diastolic heart failure HTN DM Prior PEA arrest CAD   Significant Hospital Events   8/24 BIB Ems for CC SOB. On EMS arrival, pulseless and hypoxemic. ROSC achieved. PCCM consulted for hypoxemia shortly after intubation. CVC and Aline placed by PCCM in ED 8/25 [facility] w/ much improved aeration. Able to start weaning PEEP/FIO2 down to 10 from 14 and FIO2 down from 100% to 70%. Transitioning OFF insulin gtt. Transitioning from Fent/propofol to precedex.  8/26: Seen by cardiology on the 25th for non-ST elevation MI.  Started on IV heparin and aspirin on the 25th.  Past spontaneous breathing trial on the morning of 8/26 and was extubated.  Did have some fairly significant hypertension post extubation-->progressed to rapid resp failure and subsequent PEA arrest. Re intubated. Time to ROSC 9 minutes.  8/27: More awake.  Weaning PEEP and FiO2.  Continuing to diurese and maximize hemodynamics not ready for extubation Consults:    Procedures:  8/24 ETT>8/26>>> 8/24  CVC> 8/24 Aline>  Significant Diagnostic Tests:  8/24 CXR> Diffuse bilateral pulmonary opacities. ETT 2.5cm above carina.  8/24 CTA chest>>1. No CT evidence of pulmonary embolism.2. Small bilateral pleural effusions with findings of pulmonary edema or ARDS.3. Clusters of airspace consolidation primarily involving the left lower lobe as well as upper lobes may represent pneumonia. Clinical correlation recommended.4. Aortic Atherosclerosis (ICD10-I70.0). 8/24 CT H >> negative  ECHO 8/24: Left Ventricle: Endocardial border definition is poor but systolic  function appears to be grossly normal. Cannot rule out mild septal  hypokinesis. Left ventricular ejection fraction, by estimation, is 55 to 60%. The left ventricle has normal function. The left ventricle has no regional wall motion abnormalities. Definity contrast agent was given IV to delineate the left ventricular endocardial borders. The left ventricular internal cavity size was normal in size. There is no left ventricular hypertrophy. Left ventricular diastolic parameters are indeterminate.  Micro Data:  8/24 SARS Cov2> neg 8/24 RVP >> 8/24 Tracheal aspirate >>klebsiella 8/24 BCx>>   Antimicrobials:  Unasyn 8/24 >>>8/29 Ceftriaxone 8/29>>  Interim history/subjective:   Worsening breathing overnight and this AM. Too anxious to wear BIPAP. Multiple PRNs given for high SBPs   Objective   Blood pressure (!) 153/59, pulse 99, temperature 98.6 F (37 C), temperature source Oral, resp. rate (!) 22, height 5\' 2"  (1.575 m), weight 84.5 kg, SpO2 91 %.    Vent Mode: BIPAP FiO2 (%):  [60 %] 60 % PEEP:  [6 cmH20] 6 cmH20 Pressure Support:  [8 cmH20] 8 cmH20   Intake/Output Summary (  Last 24 hours) at 12/12/2019 1019 Last data filed at 12/12/2019 0700 Gross per 24 hour  Intake 707.47 ml  Output 1700 ml  Net -992.53 ml   Filed Weights   12/09/19 0500 12/10/19 0400 12/11/19 0500  Weight: 86.9 kg 91.5 kg 84.5 kg    Examination:  GEN:  moderate distress tripoding on edge of bed HEENT: MMM, trachea midline CV: RRR, ext warm PULM: rhonci bilaterally, diminished, tachypneic GI: Soft, +BS EXT: No edema NEURO: Moves all 4 ext to command PSYCH: very anxious SKIN: No rashes  Sodium up slightly Cr actually a bit improved Net neg CBG okay Echo preserved  CXR still some volume overload  Resolved Hospital Problem list   PEA cardiac arrest: Felt to be secondary to respiratory failure with acute pulmonary edema  lactic acidosis status post cardiac arrest Circulatory shock: Status post cardiac arrest felt to be medication related with possible cardiogenic component plus/minus sepsis: Resolved as of 8/26 Recurrent PEA arrest (second arrest this admit) w/ subsequent cardiogenic shock  2/2 flash Pulmonary edema and subsequent acute respiratory failure Assessment & Plan:    Acute diastolic HF w/ Pulmonary edema, with non-ST elevation MI  -had graduated from Dr. B HF clinic, April 2021 -- EF 60%; still 55-60% w/ possible mild septal hypokinesis; RV fxn nml, RV size NML,  - IV heparin - Continue aspirin, Crestor, beta blocker - Diuresis as tolerated by renal function - May need to discuss heart cath while intubated with cardiology  Hx HTN - Goals SBP < 160, continue coreg and hydralazine for now  Recurrent Acute respiratory failure with hypoxemia in the setting of diffuse pulmonary edema Likely recurrent aspiration as well based on MBSS Klebsiella PNA -Ceftriaxone to finish 8 days total therapy.  -Worsening respiratory status today, intubate, bronch with attention to LUL - VAP bundle - Check rheum panel although suspect low yield  Fluid and electrolyte imbalance: Intermittent Plan - Replete K - Dose of metolazone - Try to keep negative  Mild AKI Current fluid balance is -3.9 L total - Metolazone today, hold loop diuretic given rising sodium  DM w/ hyperglycemia  - sliding-scale insulin with current Levemir  dosing - Blood glucose goal 140-180  Dysphagia- MBSS showed persistent aspiration with thin and nectar thick liquids.  Recommended for honey thickened when able - Re address once off vent  Best practice:  Diet: TF Pain/Anxiety/Delirium protocol (if indicated): precedex/fentanyl VAP protocol (if indicated): off  DVT prophylaxis: SQ Heparin GI prophylaxis: PPI  Glucose control: SSI  Mobility: progressive mobility Code Status: Full  Family Communication: updated at bedside Disposition: ICU  The patient is critically ill with multiple organ systems failure and requires high complexity decision making for assessment and support, frequent evaluation and titration of therapies, application of advanced monitoring technologies and extensive interpretation of multiple databases. Critical Care Time devoted to patient care services described in this note independent of APP/resident time (if applicable)  is 48 minutes.   Erskine Emery MD [facility] 12/12/2019 10:19 AM Personal pager: [phone] If unanswered, please page CCM On-call: [phone]

## 2019-12-12 NOTE — Progress Notes (Addendum)
ANTICOAGULATION CONSULT NOTE  Pharmacy Consult for heparin Indication: chest pain/ACS  Patient Measurements: Height: 5\' 2"  (157.5 cm) Weight: 84.5 kg (186 lb 4.6 oz) IBW/kg (Calculated) : 50.1 Heparin Dosing Weight: 72.4 kg  Vital Signs: Temp: 98.6 F (37 C) (08/31 0400) Temp Source: Oral (08/31 0400) BP: 154/47 (08/31 0624) Pulse Rate: 93 (08/31 0500)  Labs: Recent Labs    12/10/19 0450 12/10/19 0450 12/11/19 0353 12/11/19 2104 12/12/19 0520  HGB 8.3*  --  9.0*  --   --   HCT 26.7*  --  28.0*  --   --   PLT 142*  --  183  --   --   HEPARINUNFRC 0.29*   < > 0.24* 0.22* 0.41  CREATININE 1.10*  --  1.14*  --  1.00   < > = values in this interval not displayed.    Estimated Creatinine Clearance: 55.1 mL/min (by C-G formula based on SCr of 1 mg/dL).  Assessment: 69 YO female presenting s/p PEA arrest. Patient called EMS with acute complaints of shortness of breath and was found unresponsive in PEA arrest.  EMS provided 3 rounds of CPR and obtained ROSC prior to ED arrival.  Patient does have a history of PEA arrest in 2018.  No anticoagulation PTA.  Pharmacy was consulted to start heparin to rule out ACS.  Heparin level therapeutic at 0.41 this morning on 1400 units/hr. Hgb remains stable at 8-9. Will check confirmatory HL in 6 hours.  Goal of Therapy:  Heparin level 0.3-0.7 units/ml Monitor platelets by anticoagulation protocol: Yes   Plan:  -Continue IV heparin at 1400 units/hr. -F/u confirmatory 6 hour heparin level -F/u daily CBC  -F/u plans for cath lab  Dimple Nanas, PharmD PGY-1 [facility] Resident 12/12/2019 6:43 AM  Please check AMION for all [facility] phone numbers After 10:00 PM, call [facility] [phone]

## 2019-12-12 NOTE — Procedures (Signed)
Bronchoscopy Procedure Note  [name] Bean  [id number]  07-07-1950  Date:12/12/19  Time:11:03 AM   Provider Performing:Geovana Gebel C Tamala Julian   Procedure(s):  Flexible bronchoscopy with bronchial alveolar lavage [phone]  Indication(s) Abnormal CXR, recurrent resp failure  Consent Unable to obtain consent due to emergent nature of procedure.  Anesthesia Already in place from intubation   Time Out Verified patient identification, verified procedure, site/side was marked, verified correct patient position, special equipment/implants available, medications/allergies/relevant history reviewed, required imaging and test results available.   Sterile Technique Usual hand hygiene, masks, gowns, and gloves were used   Procedure Description Bronchoscope advanced through endotracheal tube and into airway.  Airways were examined down to subsegmental level with findings noted below.   Following diagnostic evaluation, BAL(s) performed in LLL with normal saline and return of clear fluid with mucus plugs fluid  Findings:  - ETT right at carina, withdrawn to 3cm above carina - Copious mucus plugging on left with what appears to be aspirated food particles   Complications/Tolerance None; patient tolerated the procedure well. Chest X-ray is not needed post procedure.   EBL Minimal   Specimen(s) LLL BAL

## 2019-12-12 NOTE — Progress Notes (Addendum)
Patient was given IV lasix.  I went to reassess patient and she became restless trying to void.  She panicked and began getting short of breath again with O2 sats in the low 80s.  She requested to sit on edge of bed.  Bladder scan revealed >400 in her bladder.  At the edge of the bed, she did not improve. She was diaphoretic and grey in color.  I called for CCM to come back to bedside.  Orders to reintubate.  Sister at bedside and patient educated regarding plan.  Patient assisted back to bed. RSI medications given. Patient reintubated and placed on vent.  OG, foley, and flexiseal established.    Requested for cardiology to come assess patient for possible cardiac issues in addition to respiratory issues.   VSS post intubation.

## 2019-12-12 NOTE — Procedures (Signed)
Intubation Procedure Note  [name] Bean  [id number]  01-23-1951  Date:12/12/19  Time:10:34 AM   Provider Performing:Josette Shimabukuro Shearon Stalls    Procedure: Intubation (69507)  Indication(s) Respiratory Failure  Consent Risks of the procedure as well as the alternatives and risks of each were explained to the patient and/or caregiver.  Consent for the procedure was obtained and is signed in the bedside chart   Anesthesia Etomidate, Versed, Fentanyl and Rocuronium   Time Out Verified patient identification, verified procedure, site/side was marked, verified correct patient position, special equipment/implants available, medications/allergies/relevant history reviewed, required imaging and test results available.   Sterile Technique Usual hand hygeine, masks, and gloves were used   Procedure Description Patient positioned in bed supine.  Sedation given as noted above.  Patient was intubated with endotracheal tube using Glidescope.  View was Grade 2 only posterior commissure .  Number of attempts was 1.  Colorimetric CO2 detector was consistent with tracheal placement.   Complications/Tolerance None; patient tolerated the procedure well. Chest X-ray is ordered to verify placement.   EBL Minimal   Specimen(s) None   Montey Hora, [place] [facility] 12/12/2019, 10:34 AM

## 2019-12-12 NOTE — Progress Notes (Signed)
ANTICOAGULATION CONSULT NOTE  Pharmacy Consult for heparin Indication: chest pain/ACS  Patient Measurements: Height: 5\' 2"  (157.5 cm) Weight: 84.5 kg (186 lb 4.6 oz) IBW/kg (Calculated) : 50.1 Heparin Dosing Weight: 72.4 kg  Vital Signs: Temp: 97.9 F (36.6 C) (08/31 1207) Temp Source: Oral (08/31 1207) BP: 154/107 (08/31 1300) Pulse Rate: 104 (08/31 1300)  Labs: Recent Labs    12/10/19 0450 12/10/19 0450 12/11/19 0353 12/11/19 0353 12/11/19 2104 12/12/19 0520 12/12/19 1147 12/12/19 1237 12/12/19 1352  HGB 8.3*   < > 9.0*   < >  --  8.5* 8.5*  --   --   HCT 26.7*   < > 28.0*  --   --  24.7* 25.0*  --   --   PLT 142*  --  183  --   --  249  --   --   --   HEPARINUNFRC 0.29*   < > 0.24*   < > 0.22* 0.41  --   --  0.33  CREATININE 1.10*   < > 1.14*  --   --  1.00  --  1.18*  --   TROPONINIHS  --   --   --   --   --   --   --  196*  --    < > = values in this interval not displayed.    Estimated Creatinine Clearance: 46.7 mL/min (A) (by C-G formula based on SCr of 1.18 mg/dL (H)).  Assessment: 69 YO female presenting s/p PEA arrest. Patient called EMS with acute complaints of shortness of breath and was found unresponsive in PEA arrest.  EMS provided 3 rounds of CPR and obtained ROSC prior to ED arrival.  Patient does have a history of PEA arrest in 2018.  No anticoagulation PTA.  Pharmacy was consulted to start heparin to rule out ACS.  Confirmatory heparin level therapeutic at 0.33.  Level slightly decreased from level this morning (0.41).  Will increase rate slightly to ensure patient remains therapeutic.  Continue to monitor daily HL.  Plans for cardiology to see patient this week to rule out ACS cause for PEA arrest. Heparin duration pending cardiology rec's  Goal of Therapy:  Heparin level 0.3-0.7 units/ml Monitor platelets by anticoagulation protocol: Yes   Plan:  -Increase IV heparin to 1500 units/hr. -F/u daily HL -F/u daily CBC  -F/u plans for cath lab and  heparin duration  Dimple Nanas, PharmD PGY-1 [facility] Pharm[facility] Resident 12/12/2019 2:53 PM

## 2019-12-12 NOTE — Progress Notes (Signed)
Patient pulled BIPAP mask off and refuses to wear it at this time. RT and RN explained importance of wearing BIPAP at this time to the patient. Patient stated she understands but still refuses to wear BIPAP. Patient currently has on a 15L salter HFNC and 15L NRB with spo2 93%. RT will continue to monitor patient as needed.

## 2019-12-12 NOTE — Progress Notes (Signed)
RT removed patient from Servo-U BIPAP and placed on V60 BIPAP. Patient is tolerating BIPAP settings well at this time. No respiratory distress noted. RT will continue to monitor as needed.

## 2019-12-12 NOTE — Consult Note (Addendum)
Advanced Heart Failure Team Consult Note   Primary Physician: Billie Ruddy, MD PCP-Cardiologist:  Dr Haroldine Laws  Reason for Consultation: Heart Failure   HPI:    [name] Bean is seen today for evaluation of heart failure at the request of Dr Angelena Form.   [name] Bean is a 69 year old with history of  h/o cardiac arrest 2018, CAD S/P PCI Stent  to RCA 2002 and distal LAD 2671, chronic diastolic heart failure, HTN, Hyperlipidemia, spinal stenosis, tobacco use and DMII.   Admitted March 2018 to [facility] after cardiac arrest (PEA) thought to be from pulmonary edema .Ran out of lasix 2 weeks prior to admit. Initially intubated. Had strep pneumo in sputum. Ruled out for PE. No evidenced of ischemia. Diuresed with IV lasix transition to 60 mg torsemide daily. Discharge weight 191 pounds. cMRI showed EF 65%.  Presented to [facility] via EMS and had PEA arrest. Received CPR with ROSC prior to arrival. CCM consulted. CTA negative for PE. CXR with improving pneumonia. SARS 2 negative. HS Trop [id number]. Cardiology consulted for NSTEMI. Plan for cath once improved.    8/24 intubated --> extubated 8/26 -->reintubated 8/26 -->extubated 8/29--> reintubated 12/12/19. CXR concerning for ARDs.  S/P Bronch today with mucus plug.   Sed Rate 134, C Reactive Protein 28.5,   Blood Cx: NGTD  Respiratory CX: Klebsiella Oxytoca   - ECHO EF 55-60% RVC normal. No regional wall motion abnormalities.  - cMRI 3/18 EF 60-65%. -stress cMRI at [facility] 3/18 with EF 60-65% no reversible ischemia   Review of Systems: [y] = yes, [ ]  = no Patient is encephalopathic and or intubated. Therefore history has been obtained from chart review.    . General: Weight gain [ ] ; Weight loss [ ] ; Anorexia [ ] ; Fatigue [ ] ; Fever [ ] ; Chills [ ] ; Weakness [ ]   . Cardiac: Chest pain/pressure [ ] ; Resting SOB [ ] ; Exertional SOB [Y ]; Orthopnea [ ] ; Pedal Edema [ ] ; Palpitations [ ] ; Syncope [ ] ; Presyncope [ ] ; Paroxysmal nocturnal  dyspnea[ ]   . Pulmonary: Cough [ ] ; Wheezing[ ] ; Hemoptysis[ ] ; Sputum [ ] ; Snoring [ ]   . GI: Vomiting[ ] ; Dysphagia[ ] ; Melena[ ] ; Hematochezia [ ] ; Heartburn[ ] ; Abdominal pain [ ] ; Constipation [ ] ; Diarrhea [ ] ; BRBPR [ ]   . GU: Hematuria[ ] ; Dysuria [ ] ; Nocturia[ ]   . Vascular: Pain in legs with walking [ ] ; Pain in feet with lying flat [ ] ; Non-healing sores [ ] ; Stroke [ ] ; TIA [ ] ; Slurred speech [ ] ;  . Neuro: Headaches[ ] ; Vertigo[ ] ; Seizures[ ] ; Paresthesias[ ] ;Blurred vision [ ] ; Diplopia [ ] ; Vision changes [ ]   . Ortho/Skin: Arthritis [ ] ; Joint pain [ Y]; Muscle pain [ ] ; Joint swelling [ ] ; Back Pain [ Y]; Rash [ ]   . Psych: Depression[ ] ; Anxiety[ ]   . Heme: Bleeding problems [ ] ; Clotting disorders [ ] ; Anemia [ ]   . Endocrine: Diabetes [ Y]; Thyroid dysfunction[ ]   Home Medications Prior to Admission medications   Medication Sig Start Date End Date Taking? Authorizing Provider  acetaminophen (TYLENOL) 500 MG tablet Take 2 tablets (1,000 mg total) by mouth every 6 (six) hours as needed for mild pain. 10/03/19   [name], MD  aspirin EC 81 MG tablet Take 81 mg by mouth daily.     [provider]  BD PEN NEEDLE NANO U/F 32G X 4 MM MISC USE TO INJECT INSULIN 5 TIMES A  DAY 03/15/13   Shawna Orleans, Doe-Hyun R, DO  bimatoprost (LUMIGAN) 0.01 % SOLN Lumigan 0.01 % eye drops  PUT 1 DROP INTO BOTH EYES AT BEDTIME    [provider]  Blood Glucose Monitoring Suppl (ACCU-CHEK COMPACT CARE KIT) KIT Accu-Chek Compact Plus Care kit  USE AS INSTRUCTED.    [provider]  carvedilol (COREG) 25 MG tablet Take 1 tablet (25 mg total) by mouth in the morning and at bedtime. 07/31/19   Devrin Monforte, Shaune Pascal, MD  glucose blood (FREESTYLE TEST STRIPS) test strip 1 each by Other route daily. (accu-chek guide) DX E11.9 08/06/17   Billie Ruddy, MD  hydrALAZINE (APRESOLINE) 50 MG tablet Take 1 tablet (50 mg total) by mouth in the morning and at bedtime. 07/31/19   Dessiree Sze,  Shaune Pascal, MD  ibuprofen (MOTRIN IB) 200 MG tablet Take 3 tablets (600 mg total) by mouth every 6 (six) hours as needed for mild pain or cramping. 10/03/19   [name], MD  insulin aspart (NOVOLOG) 100 UNIT/ML FlexPen Inject 12 units 3 times daily with meals Patient taking differently: Inject 12 Units into the skin 3 (three) times daily with meals.  05/24/19   Billie Ruddy, MD  LANTUS SOLOSTAR 100 UNIT/ML Solostar Pen INJECT 38 UNITS INTO THE SKIN AT BEDTIME. Patient taking differently: Inject 30 Units into the skin at bedtime.  08/16/19   Billie Ruddy, MD  latanoprost (XALATAN) 0.005 % ophthalmic solution Place 1 drop into both eyes at bedtime.    [provider]  LORazepam (ATIVAN) 0.5 MG tablet Take one tab 30 mins prior to dental procedure. 01/09/19   Billie Ruddy, MD  losartan (COZAAR) 100 MG tablet TAKE 1 TABLET BY MOUTH EVERY DAY Patient taking differently: Take 100 mg by mouth daily.  07/03/19   Billie Ruddy, MD  rosuvastatin (CRESTOR) 20 MG tablet TAKE 1 TABLET BY MOUTH EVERY DAY Patient taking differently: Take 20 mg by mouth daily.  04/03/19   Billie Ruddy, MD  sertraline (ZOLOFT) 50 MG tablet TAKE 1 TABLET BY MOUTH EVERY DAY Patient taking differently: Take 50 mg by mouth daily.  07/03/19   Billie Ruddy, MD  tiZANidine (ZANAFLEX) 2 MG tablet Take by mouth every 6 (six) hours as needed for muscle spasms.    [provider]  torsemide (DEMADEX) 20 MG tablet TAKE 1 TO 2 TABLETS DAILY AS NEEDED FOR SWELLING Patient taking differently: Take 20-40 mg by mouth daily as needed (swelling).  03/29/19   Billie Ruddy, MD  varenicline (CHANTIX CONTINUING MONTH PAK) 1 MG tablet Take 1 tablet (1 mg total) by mouth 2 (two) times daily. 07/03/19   Billie Ruddy, MD    Past Medical History: Past Medical History:  Diagnosis Date  . Anemia   . Anxiety   . Arthritis   . Asthma   . Broken arm    left arm  . Bronchitis   . Bursitis of left hip   .  CAD (coronary artery disease)   . Candidiasis, vagina   . Cardiac arrest ([place]) 06/20/2016   at [facility]; ? due to flash pulm edema vs undertreated chronic CHF  . Cerumen impaction   . CHF (congestive heart failure) ([place])    RECENT ADMIT TO [facility]   . Colon, diverticulosis   . Depression   . Diabetes mellitus    15 YRS AGO  . Fatigue   . Gastroenteritis   . Hot flashes, menopausal   .  Hyperlipidemia   . Knee pain, left   . Lumbar back pain   . Maxillary sinusitis    history  . Muscle tear    right gluteus  . Nausea   . Nicotine dependence   . OSA (obstructive sleep apnea) 10/16/2017  . Other B-complex deficiencies   . Otitis media, acute    left  . Peripheral neuropathy   . Spinal stenosis of lumbar region   . Tubulovillous adenoma of colon   . Unspecified essential hypertension     Past Surgical History: Past Surgical History:  Procedure Laterality Date  . CARDIAC CATHETERIZATION  2015, 2009, 2006  . CERVICAL SPINE SURGERY  2003  . CORONARY ANGIOPLASTY WITH STENT PLACEMENT  2002, 2015  . DILATATION & CURETTAGE/HYSTEROSCOPY WITH MYOSURE N/A 10/03/2019   Procedure: DILATATION & CURETTAGE/HYSTEROSCOPY WITH MYOSURE;  Surgeon: [name], MD;  Location: [place];  Service: Gynecology;  Laterality: N/A;  request 7:30am OR time in [facility] Gynecology requests 30 minutes OR time  . EYE SURGERY     cataracts bilaterally  . POSTERIOR CERVICAL LAMINECTOMY N/A 10/23/2016   Procedure: POSTERIOR CERVICAL LAMINECTOMY MULTI LEVEL CERVICAL TWO- CERVICAL THREE, CERVICAL THREE- CERVICAL FOUR;  Surgeon: Ashok Pall, MD;  Location: [place];  Service: Neurosurgery;  Laterality: N/A;  POSTERIOR    Family History: Family History  Problem Relation Age of Onset  . Pneumonia Father        deceased  . Alcohol abuse Father   . Hypertension Mother   . Diabetes Mother     Social History: Social History   Socioeconomic History  . Marital status: Single    Spouse name:  Not on file  . Number of children: Not on file  . Years of education: Not on file  . Highest education level: Not on file  Occupational History  . Occupation: SR DEV OFFICER    Employer: [employer]    Comment: [employer]  Tobacco Use  . Smoking status: Current Every Day Smoker    Packs/day: 0.25    Years: 46.00    Pack years: 11.50    Types: Cigarettes  . Smokeless tobacco: Never Used  . Tobacco comment: smokes two a day  Vaping Use  . Vaping Use: Never used  Substance and Sexual Activity  . Alcohol use: Yes    Comment: rarely  . Drug use: Yes    Types: Marijuana  . Sexual activity: Not Currently    Comment: 1st intercourse 69 yo-More than 5 partners  Other Topics Concern  . Not on file  Social History Narrative  . Not on file   Social Determinants of Health   Financial Resource Strain:   . Difficulty of Paying Living Expenses: Not on file  Food Insecurity:   . Worried About Charity fundraiser in the Last Year: Not on file  . Ran Out of Food in the Last Year: Not on file  Transportation Needs:   . Lack of Transportation (Medical): Not on file  . Lack of Transportation (Non-Medical): Not on file  Physical Activity:   . Days of Exercise per Week: Not on file  . Minutes of Exercise per Session: Not on file  Stress:   . Feeling of Stress : Not on file  Social Connections:   . Frequency of Communication with Friends and Family: Not on file  . Frequency of Social Gatherings with Friends and Family: Not on file  . Attends Religious Services: Not on file  .  Active Member of Clubs or Organizations: Not on file  . Attends Archivist Meetings: Not on file  . Marital Status: Not on file    Allergies:  Allergies  Allergen Reactions  . Sitagliptin Other (See Comments)    Had pancreatitis while on Januvia     Objective:    Vital Signs:   Temp:  [97.5 F (36.4 C)-98.9 F (37.2 C)] 97.9 F (36.6 C) (08/31 1207) Pulse Rate:  [82-113] 99 (08/31 0800) Resp:   [16-34] 22 (08/31 0800) BP: (111-176)/(47-140) 153/59 (08/31 0800) SpO2:  [89 %-97 %] 94 % (08/31 1100) Arterial Line BP: (145-215)/(44-68) 188/48 (08/31 0800) FiO2 (%):  [60 %-100 %] 100 % (08/31 1020) Last BM Date: 12/11/19  Weight change: Filed Weights   12/09/19 0500 12/10/19 0400 12/11/19 0500  Weight: 86.9 kg 91.5 kg 84.5 kg    Intake/Output:   Intake/Output Summary (Last 24 hours) at 12/12/2019 1254 Last data filed at 12/12/2019 0700 Gross per 24 hour  Intake 600.49 ml  Output 1100 ml  Net -499.51 ml      Physical Exam    General: Intubated/sedated HEENT: ETT Neck: supple. JVP difficult to assess due to body habiuts.  . Carotids 2+ bilat; no bruits. No lymphadenopathy or thyromegaly appreciated. Cor: PMI nondisplaced. Regular rate & rhythm. No rubs, gallops or murmurs. Lungs: Rhonchi Abdomen: soft, nontender, nondistended. No hepatosplenomegaly. No bruits or masses. Good bowel sounds. Extremities: no cyanosis, clubbing, rash, edema Neuro: intubated/sedated   Telemetry   SR-ST 90-100s   EKG     SR 94  bpm   Labs   Basic Metabolic Panel: Recent Labs  Lab 12/06/19 0500 12/06/19 0504 12/06/19 1605 12/06/19 1605 12/07/19 0304 12/07/19 0315 12/07/19 0928 12/07/19 1138 12/07/19 1706 12/08/19 0324 12/09/19 0521 12/09/19 0521 12/09/19 1653 12/09/19 1653 12/10/19 0450 12/11/19 0353 12/12/19 0520 12/12/19 1147  NA 139   < > 140   < > 140   < > 142   < >  --    < > 144   < > 145  --  145 147* 150* 151*  K 3.6   < > 3.8   < > 3.7   < > 3.7   < >  --    < > 4.3   < > 4.1  --  3.8 3.0* 3.2* 3.7  CL 110   < > 107   < > 108   < > 105  --   --    < > 106  --  107  --  109 107 110  --   CO2 20*   < > 21*   < > 22   < > 20*  --   --    < > 29  --  27  --  $R'30 26 28  'Ax$ --   GLUCOSE 174*   < > 219*   < > 153*   < > 320*  --   --    < > 171*  --  255*  --  214* 141* 108*  --   BUN 13   < > 13   < > 13   < > 15  --   --    < > 36*  --  35*  --  28* 28* 23  --     CREATININE 0.96   < > 0.95   < > 0.90   < > 1.20*  --   --    < >  1.38*  --  1.20*  --  1.10* 1.14* 1.00  --   CALCIUM 8.1*   < > 8.1*   < > 8.0*   < > 8.2*  --   --    < > 8.8*   < > 8.7*   < > 8.4* 8.7* 8.9  --   MG 1.6*   < > 2.5*   < > 2.1  --  2.4  --  1.8  --   --   --   --   --  2.2 2.3  --   --   PHOS 1.7*  --  4.4  --  3.2  --   --   --  4.4  --   --   --   --   --   --   --   --   --    < > = values in this interval not displayed.    Liver Function Tests: Recent Labs  Lab 11/23/2019 1530 12/06/19 1605 12/07/19 0304 12/07/19 0928  AST 38 59* 56* 275*  ALT $Re'21 24 24 'yBw$ 214*  ALKPHOS 106 84 79 121  BILITOT 0.5 0.7 1.0 0.8  PROT 5.9* 6.0* 5.9* 6.2*  ALBUMIN 2.8* 2.8* 2.7* 2.7*   No results for input(s): LIPASE, AMYLASE in the last 168 hours. No results for input(s): AMMONIA in the last 168 hours.  CBC: Recent Labs  Lab 11/19/2019 1422 12/09/2019 1455 12/07/19 0928 12/07/19 1138 12/08/19 0324 12/08/19 0335 12/09/19 0521 12/10/19 0450 12/11/19 0353 12/12/19 0520 12/12/19 1147  WBC 8.7   < > 13.1*   < > 14.6*  --  11.2* 11.2* 8.7 12.3*  --   NEUTROABS 4.4  --  9.0*  --   --   --   --   --   --   --   --   HGB 14.9   < > 12.8   < > 10.8*   < > 9.1* 8.3* 9.0* 8.5* 8.5*  HCT 46.4*   < > 39.1   < > 30.9*   < > 28.9* 26.7* 28.0* 24.7* 25.0*  MCV 93.7   < > 96.5   < > 93.6  --  96.0 98.5 100.0 100.0  --   PLT 272   < > PLATELET CLUMPS NOTED ON SMEAR, UNABLE TO ESTIMATE   < > 153  --  142* 142* 183 249  --    < > = values in this interval not displayed.    Cardiac Enzymes: No results for input(s): CKTOTAL, CKMB, CKMBINDEX, TROPONINI in the last 168 hours.  BNP: BNP (last 3 results) Recent Labs    12/08/19 0324 12/09/19 0521 12/10/19 0450  BNP 228.5* 136.4* 611.7*    ProBNP (last 3 results) No results for input(s): PROBNP in the last 8760 hours.   CBG: Recent Labs  Lab 12/11/19 1958 12/11/19 2324 12/12/19 0359 12/12/19 0656 12/12/19 1205  GLUCAP 213* 172*  126* 100* 250*    Coagulation Studies: No results for input(s): LABPROT, INR in the last 72 hours.   Imaging   Portable Chest x-ray  Result Date: 12/12/2019 CLINICAL DATA:  Intubated, enteric tube placement after cardiac arrest EXAM: PORTABLE CHEST 1 VIEW COMPARISON:  Chest radiograph from one day prior. FINDINGS: Endotracheal tube tip is in the proximal right mainstem bronchus. Enteric tube enters stomach with the tip not seen on this image. Left internal jugular central venous catheter terminates in the lower third of the SVC. Stable cardiomediastinal  silhouette with normal heart size. No pneumothorax. Possible trace bilateral pleural effusions. Extensive patchy opacities throughout both lungs, most prominent in left greater than right parahilar lungs, worsened. IMPRESSION: 1. Endotracheal tube tip in the proximal right mainstem bronchus. 2. Extensive patchy opacities throughout both lungs, most prominent in the left greater than right parahilar lungs, worsened, compatible with any combination of worsening pulmonary edema, aspiration or atelectasis. Critical Value/emergent results were called by telephone at the time of interpretation on 12/12/2019 at 10:24 am to provider [facility] , who verbally acknowledged these results. Electronically Signed   By: Ilona Sorrel M.D.   On: 12/12/2019 10:28   DG Swallowing Func-Speech Pathology  Result Date: 12/11/2019 Objective Swallowing Evaluation: Type of Study: MBS-Modified Barium Swallow Study  Patient Details Name: Izadora Roehr MRN: [id number] Date of Birth: Apr 26, 1950 Today's Date: 12/11/2019 Time: SLP Start Time (ACUTE ONLY): 1318 -SLP Stop Time (ACUTE ONLY): 1340 SLP Time Calculation (min) (ACUTE ONLY): 22 min Past Medical History: Past Medical History: Diagnosis Date . Anemia  . Anxiety  . Arthritis  . Asthma  . Broken arm   left arm . Bronchitis  . Bursitis of left hip  . CAD (coronary artery disease)  . Candidiasis, vagina  . Cardiac arrest  ([place]) 06/20/2016  at [facility]; ? due to flash pulm edema vs undertreated chronic CHF . Cerumen impaction  . CHF (congestive heart failure) ([place])   RECENT ADMIT TO [facility]  . Colon, diverticulosis  . Depression  . Diabetes mellitus   15 YRS AGO . Fatigue  . Gastroenteritis  . Hot flashes, menopausal  . Hyperlipidemia  . Knee pain, left  . Lumbar back pain  . Maxillary sinusitis   history . Muscle tear   right gluteus . Nausea  . Nicotine dependence  . OSA (obstructive sleep apnea) 10/16/2017 . Other B-complex deficiencies  . Otitis media, acute   left . Peripheral neuropathy  . Spinal stenosis of lumbar region  . Tubulovillous adenoma of colon  . Unspecified essential hypertension  Past Surgical History: Past Surgical History: Procedure Laterality Date . CARDIAC CATHETERIZATION  2015, 2009, 2006 . CERVICAL SPINE SURGERY  2003 . CORONARY ANGIOPLASTY WITH STENT PLACEMENT  2002, 2015 . DILATATION & CURETTAGE/HYSTEROSCOPY WITH MYOSURE N/A 10/03/2019  Procedure: DILATATION & CURETTAGE/HYSTEROSCOPY WITH MYOSURE;  Surgeon: [name], MD;  Location: [place];  Service: Gynecology;  Laterality: N/A;  request 7:30am OR time in [facility] Gynecology requests 30 minutes OR time . EYE SURGERY    cataracts bilaterally . POSTERIOR CERVICAL LAMINECTOMY N/A 10/23/2016  Procedure: POSTERIOR CERVICAL LAMINECTOMY MULTI LEVEL CERVICAL TWO- CERVICAL THREE, CERVICAL THREE- CERVICAL FOUR;  Surgeon: Ashok Pall, MD;  Location: [place];  Service: Neurosurgery;  Laterality: N/A;  POSTERIOR HPI: Pt is a7 y.o.female with PMH significant for HTN, HLD, DM, CHF, cardiac arrest, CAD, posterior cervical laminectomy 2018, and anxiety who presented to the ED s/p cardiac arrest. Per report patient called EMS with acute complaints of SOB and found unresponsive in PEA arrest. EMS provided 3 rounds of CPR and obtained ROSC prior to ED arrival. No ACLS medications were given. ETT 8/24-8/26. CXR 8/30: Stable probable component of  interstitial edema and areas of bilateral atelectasis. Possible small bilateral pleural effusions. Per order, RN swallow eval completed but pt persistently coughing with swallowing trials on 8/29  No data recorded Assessment / Plan / Recommendation CHL IP CLINICAL IMPRESSIONS 12/11/2019 Clinical Impression  Pt presents with pharyngeal dysphagia characterized by a pharyngeal delay, reduced lingual retraction, and  reduced anterior laryngeal movement. Pt's swallow was triggered with the head of the bolus at the level of valleculae and pyriform sinuses. She demonstrated vallecular residue and pyriform sinus residue which was mild to moderate depending on bolus size and consistency. Pt exhibited aspiration (PAS 7) with thin liquids and penetration (PAS 3) and ultimate aspiration (PAS 7, 8) was also noted with nectar thick liquids. Most instances of laryngeal invasion resulted in throat clearing and/or coughing. This was effective in propelling the aspirant superiorly but subsequent aspiration was inconsistently noted. Postural modifications were attempted but no functional benefit was noted. Use of an effortful swallow inconsistently eliminated laryngeal invasion with nectar thick liquids but not with thin liquids. A dysphagia 2 diet with honey thick liquids is recommended at this time with allowance of ice chips between meals following oral care. SLP SLP Visit Diagnosis Dysphagia, pharyngeal phase (R13.13) Attention and concentration deficit following -- Frontal lobe and executive function deficit following -- Impact on safety and function Mild aspiration risk;Moderate aspiration risk   CHL IP TREATMENT RECOMMENDATION 12/11/2019 Treatment Recommendations Therapy as outlined in treatment plan below   Prognosis 12/11/2019 Prognosis for Safe Diet Advancement Good Barriers to Reach Goals Time post onset Barriers/Prognosis Comment -- CHL IP DIET RECOMMENDATION 12/11/2019 SLP Diet Recommendations Dysphagia 2 (Fine chop)  solids;Honey thick liquids Liquid Administration via Cup;Straw Medication Administration Whole meds with liquid Compensations Slow rate;Small sips/bites;Follow solids with liquid;Clear throat intermittently Postural Changes Seated upright at 90 degrees   CHL IP OTHER RECOMMENDATIONS 12/11/2019 Recommended Consults -- Oral Care Recommendations Oral care BID Other Recommendations Order thickener from pharmacy   CHL IP FOLLOW UP RECOMMENDATIONS 12/11/2019 Follow up Recommendations Home health SLP   CHL IP FREQUENCY AND DURATION 12/11/2019 Speech Therapy Frequency (ACUTE ONLY) min 2x/week Treatment Duration 2 weeks      CHL IP ORAL PHASE 12/11/2019 Oral Phase WFL Oral - Pudding Teaspoon -- Oral - Pudding Cup -- Oral - Honey Teaspoon -- Oral - Honey Cup -- Oral - Nectar Teaspoon -- Oral - Nectar Cup -- Oral - Nectar Straw -- Oral - Thin Teaspoon -- Oral - Thin Cup -- Oral - Thin Straw -- Oral - Puree -- Oral - Mech Soft -- Oral - Regular -- Oral - Multi-Consistency -- Oral - Pill -- Oral Phase - Comment --  CHL IP PHARYNGEAL PHASE 12/11/2019 Pharyngeal Phase Impaired Pharyngeal- Pudding Teaspoon -- Pharyngeal -- Pharyngeal- Pudding Cup -- Pharyngeal -- Pharyngeal- Honey Teaspoon -- Pharyngeal -- Pharyngeal- Honey Cup Reduced anterior laryngeal mobility;Delayed swallow initiation-vallecula;Delayed swallow initiation-pyriform sinuses;Pharyngeal residue - valleculae Pharyngeal Material does not enter airway Pharyngeal- Nectar Teaspoon -- Pharyngeal -- Pharyngeal- Nectar Cup Reduced anterior laryngeal mobility;Delayed swallow initiation-vallecula;Delayed swallow initiation-pyriform sinuses;Penetration/Aspiration before swallow;Penetration/Aspiration during swallow;Penetration/Apiration after swallow;Trace aspiration;Pharyngeal residue - valleculae Pharyngeal Material enters airway, remains ABOVE vocal cords and not ejected out;Material enters airway, passes BELOW cords and not ejected out despite cough attempt by patient  Pharyngeal- Nectar Straw -- Pharyngeal -- Pharyngeal- Thin Teaspoon -- Pharyngeal -- Pharyngeal- Thin Cup Reduced anterior laryngeal mobility;Delayed swallow initiation-vallecula;Delayed swallow initiation-pyriform sinuses;Penetration/Aspiration before swallow;Penetration/Aspiration during swallow;Penetration/Apiration after swallow;Trace aspiration;Pharyngeal residue - valleculae Pharyngeal Material enters airway, passes BELOW cords and not ejected out despite cough attempt by patient Pharyngeal- Thin Straw -- Pharyngeal -- Pharyngeal- Puree Reduced anterior laryngeal mobility;Delayed swallow initiation-vallecula;Delayed swallow initiation-pyriform sinuses;Pharyngeal residue - valleculae Pharyngeal -- Pharyngeal- Mechanical Soft Reduced anterior laryngeal mobility;Delayed swallow initiation-vallecula;Delayed swallow initiation-pyriform sinuses;Pharyngeal residue - valleculae Pharyngeal -- Pharyngeal- Regular Reduced anterior laryngeal mobility;Delayed swallow initiation-vallecula;Delayed swallow initiation-pyriform sinuses;Pharyngeal residue -  valleculae Pharyngeal -- Pharyngeal- Multi-consistency -- Pharyngeal -- Pharyngeal- Pill Reduced anterior laryngeal mobility;Delayed swallow initiation-vallecula;Delayed swallow initiation-pyriform sinuses;Pharyngeal residue - valleculae Pharyngeal -- Pharyngeal Comment --  CHL IP CERVICAL ESOPHAGEAL PHASE 12/11/2019 Cervical Esophageal Phase WFL Pudding Teaspoon -- Pudding Cup -- Honey Teaspoon -- Honey Cup -- Nectar Teaspoon -- Nectar Cup -- Nectar Straw -- Thin Teaspoon -- Thin Cup -- Thin Straw -- Puree -- Mechanical Soft -- Regular -- Multi-consistency -- Pill -- Cervical Esophageal Comment -- Shanika I. Hardin Negus, [place], [place] Office number [phone] Pager [place] 12/11/2019, 2:42 PM                 Medications:     Current Medications: . [START ON 01/07/2020] aspirin  81 mg Per Tube Daily  . carvedilol  6.25  mg Per Tube BID WC  . Chlorhexidine Gluconate Cloth  6 each Topical Daily  . hydrALAZINE  25 mg Per Tube Q8H  . insulin aspart  3-9 Units Subcutaneous Q4H  . insulin detemir  12 Units Subcutaneous Q12H  . pantoprazole (PROTONIX) IV  40 mg Intravenous Daily  . [START ON 12/24/2019] rosuvastatin  20 mg Per Tube Daily  . sertraline  50 mg Oral Daily  . sodium chloride flush  10 mL Intracatheter Q12H     Infusions: . cefTRIAXone (ROCEPHIN)  IV 2 g (12/12/19 1232)  . dexmedetomidine (PRECEDEX) IV infusion 1 mcg/kg/hr (12/12/19 1015)  . heparin 1,400 Units/hr (12/12/19 0743)  . potassium chloride 10 mEq (12/12/19 1203)       Assessment/Plan   1. PEA Arrest On admit with ROSC . Had previous PEA arrest in 2018 due to pulmonary edema.  - Plan cath   2. Acute Hypoxic Respiratory Failure -8/24 intubated --> extubated 8/26 -->reintubated 8/26 -->extubated 8/29--> reintubated 12/12/19. CXR concerning for ARDs. - Respiratory Cx: klebsiella --> on rocephin   - S/P Bronch today with mucus plug possible food   3. A/C Diastolic HF  -ECHO 10/23/4578 EF 55-60% RV normal - Set  Up CVP and check CO-OX  - Continue current carvedilol.   - Diuresing with IV lasix.    4. NSTEMI  H/O CAD with S/P PCI Stent  to RCA 2002 and distal LAD 2015 - HS Trop [id number] - On statin - Plan for cath   5. HTN  Follow closely   6. DMII On SSI    Length of Stay: [place]erm[place], NP  12/12/2019, 12:54 PM  Advanced Heart Failure Team Pager [phone] (M-F; 7a - 4p)  Please contact [facility] for night-coverage after hours (4p -7a ) and weekends on [email or web address]  Agree with above.   69 y/o woman with chronic diastolic HF, CAD s/p previous RCA and LAD stents, COPD and DM2. Recently discharged from HF clinic due to HF stability.   Now admitted with PEA arrest felt to be respiratory in nature but trop bumped 293 -> 8,932. Was extubated and doing well. EF 55-60%. Now with recurrent PEA arrest felt to be due to  aspiration. Airway cultures growing Klebsiella. Repeat echo today EF 45%. Co-ox also 45%. Now hypotensive.   General:  Intubated/sedated HEENT: normal +ETT Neck: supple. no JVD. Carotids 2+ bilat; no bruits. No lymphadenopathy or thryomegaly appreciated. Cor: PMI nondisplaced. Regular rate & rhythm. No rubs, gallops or murmurs. Lungs: + crackles Abdomen: obese soft, nontender, nondistended. No hepatosplenomegaly. No bruits or masses. Good bowel sounds. Extremities: no cyanosis, clubbing, rash, edema Neuro: alert & orientedx3, cranial nerves  grossly intact. moves all 4 extremities w/o difficulty. Affect pleasant  Appears to have have mixed septic and cardiogenic shock currently. Will star NE. Pharmacy adjusting abx. Will need cath once she improves. Also will need to rule out ongoing aspiration risk.   CRITICAL CARE Performed by: Glori Bickers  Total critical care time: 45 minutes  Critical care time was exclusive of separately billable procedures and treating other patients.  Critical care was necessary to treat or prevent imminent or life-threatening deterioration.  Critical care was time spent personally by me (independent of midlevel providers or residents) on the following activities: development of treatment plan with patient and/or surrogate as well as nursing, discussions with consultants, evaluation of patient's response to treatment, examination of patient, obtaining history from patient or surrogate, ordering and performing treatments and interventions, ordering and review of laboratory studies, ordering and review of radiographic studies, pulse oximetry and re-evaluation of patient's condition.   Glori Bickers, MD  6:01 PM

## 2019-12-13 ENCOUNTER — Encounter (HOSPITAL_COMMUNITY): Admission: EM | Disposition: E | Payer: Self-pay | Source: Home / Self Care | Attending: Critical Care Medicine

## 2019-12-13 ENCOUNTER — Inpatient Hospital Stay (HOSPITAL_COMMUNITY): Payer: Medicare PPO

## 2019-12-13 DIAGNOSIS — I214 Non-ST elevation (NSTEMI) myocardial infarction: Secondary | ICD-10-CM

## 2019-12-13 HISTORY — PX: CORONARY STENT INTERVENTION: CATH118234

## 2019-12-13 HISTORY — PX: RIGHT/LEFT HEART CATH AND CORONARY ANGIOGRAPHY: CATH118266

## 2019-12-13 LAB — CBC
HCT: 25.6 % — ABNORMAL LOW (ref 36.0–46.0)
HCT: 22.7 % — ABNORMAL LOW (ref 36.0–46.0)
HCT: 23.5 % — ABNORMAL LOW (ref 36.0–46.0)
Hemoglobin: 7.9 g/dL — ABNORMAL LOW (ref 12.0–15.0)
Hemoglobin: 6.8 g/dL — CL (ref 12.0–15.0)
Hemoglobin: 7.1 g/dL — ABNORMAL LOW (ref 12.0–15.0)
MCH: 31.2 pg (ref 26.0–34.0)
MCH: 30.2 pg (ref 26.0–34.0)
MCH: 30.5 pg (ref 26.0–34.0)
MCHC: 30.9 g/dL (ref 30.0–36.0)
MCHC: 30 g/dL (ref 30.0–36.0)
MCHC: 30.2 g/dL (ref 30.0–36.0)
MCV: 101.2 fL — ABNORMAL HIGH (ref 80.0–100.0)
MCV: 100.9 fL — ABNORMAL HIGH (ref 80.0–100.0)
MCV: 100.9 fL — ABNORMAL HIGH (ref 80.0–100.0)
Platelets: 310 10*3/uL (ref 150–400)
Platelets: 283 10*3/uL (ref 150–400)
Platelets: 297 10*3/uL (ref 150–400)
RBC: 2.53 MIL/uL — ABNORMAL LOW (ref 3.87–5.11)
RBC: 2.25 MIL/uL — ABNORMAL LOW (ref 3.87–5.11)
RBC: 2.33 MIL/uL — ABNORMAL LOW (ref 3.87–5.11)
RDW: 13.4 % (ref 11.5–15.5)
RDW: 13.6 % (ref 11.5–15.5)
RDW: 13.5 % (ref 11.5–15.5)
WBC: 17.5 10*3/uL — ABNORMAL HIGH (ref 4.0–10.5)
WBC: 14.3 10*3/uL — ABNORMAL HIGH (ref 4.0–10.5)
WBC: 15.2 10*3/uL — ABNORMAL HIGH (ref 4.0–10.5)
nRBC: 0.3 % — ABNORMAL HIGH (ref 0.0–0.2)
nRBC: 0.2 % (ref 0.0–0.2)
nRBC: 0.2 % (ref 0.0–0.2)

## 2019-12-13 LAB — POCT I-STAT EG7
Acid-Base Excess: 3 mmol/L — ABNORMAL HIGH (ref 0.0–2.0)
Acid-base deficit: 2 mmol/L (ref 0.0–2.0)
Bicarbonate: 23.6 mmol/L (ref 20.0–28.0)
Bicarbonate: 27.6 mmol/L (ref 20.0–28.0)
Calcium, Ion: 0.88 mmol/L — CL (ref 1.15–1.40)
Calcium, Ion: 1.14 mmol/L — ABNORMAL LOW (ref 1.15–1.40)
HCT: 19 % — ABNORMAL LOW (ref 36.0–46.0)
HCT: 20 % — ABNORMAL LOW (ref 36.0–46.0)
Hemoglobin: 6.5 g/dL — CL (ref 12.0–15.0)
Hemoglobin: 6.8 g/dL — CL (ref 12.0–15.0)
O2 Saturation: 47 %
O2 Saturation: 49 %
Potassium: 2.9 mmol/L — ABNORMAL LOW (ref 3.5–5.1)
Potassium: 3.5 mmol/L (ref 3.5–5.1)
Sodium: 152 mmol/L — ABNORMAL HIGH (ref 135–145)
Sodium: 153 mmol/L — ABNORMAL HIGH (ref 135–145)
TCO2: 25 mmol/L (ref 22–32)
TCO2: 29 mmol/L (ref 22–32)
pCO2, Ven: 41.5 mmHg — ABNORMAL LOW (ref 44.0–60.0)
pCO2, Ven: 44.5 mmHg (ref 44.0–60.0)
pH, Ven: 7.363 (ref 7.250–7.430)
pH, Ven: 7.4 (ref 7.250–7.430)
pO2, Ven: 26 mmHg — CL (ref 32.0–45.0)
pO2, Ven: 27 mmHg — CL (ref 32.0–45.0)

## 2019-12-13 LAB — CREATININE, SERUM
Creatinine, Ser: 1.44 mg/dL — ABNORMAL HIGH (ref 0.44–1.00)
GFR calc Af Amer: 43 mL/min — ABNORMAL LOW (ref >60)
GFR calc non Af Amer: 37 mL/min — ABNORMAL LOW (ref >60)

## 2019-12-13 LAB — BASIC METABOLIC PANEL
Anion gap: 12 (ref 5–15)
BUN: 40 mg/dL — ABNORMAL HIGH (ref 8–23)
CO2: 25 mmol/L (ref 22–32)
Calcium: 8.4 mg/dL — ABNORMAL LOW (ref 8.9–10.3)
Chloride: 112 mmol/L — ABNORMAL HIGH (ref 98–111)
Creatinine, Ser: 1.46 mg/dL — ABNORMAL HIGH (ref 0.44–1.00)
GFR calc Af Amer: 43 mL/min — ABNORMAL LOW (ref >60)
GFR calc non Af Amer: 37 mL/min — ABNORMAL LOW (ref >60)
Glucose, Bld: 171 mg/dL — ABNORMAL HIGH (ref 70–99)
Potassium: 3.5 mmol/L (ref 3.5–5.1)
Sodium: 149 mmol/L — ABNORMAL HIGH (ref 135–145)

## 2019-12-13 LAB — POCT I-STAT, CHEM 8
BUN: 41 mg/dL — ABNORMAL HIGH (ref 8–23)
Calcium, Ion: 1.02 mmol/L — ABNORMAL LOW (ref 1.15–1.40)
Chloride: 116 mmol/L — ABNORMAL HIGH (ref 98–111)
Creatinine, Ser: 1.2 mg/dL — ABNORMAL HIGH (ref 0.44–1.00)
Glucose, Bld: 172 mg/dL — ABNORMAL HIGH (ref 70–99)
HCT: 18 % — ABNORMAL LOW (ref 36.0–46.0)
Hemoglobin: 6.1 g/dL — CL (ref 12.0–15.0)
Potassium: 3.4 mmol/L — ABNORMAL LOW (ref 3.5–5.1)
Sodium: 153 mmol/L — ABNORMAL HIGH (ref 135–145)
TCO2: 25 mmol/L (ref 22–32)

## 2019-12-13 LAB — GLUCOSE, CAPILLARY
Glucose-Capillary: 161 mg/dL — ABNORMAL HIGH (ref 70–99)
Glucose-Capillary: 253 mg/dL — ABNORMAL HIGH (ref 70–99)
Glucose-Capillary: 261 mg/dL — ABNORMAL HIGH (ref 70–99)
Glucose-Capillary: 134 mg/dL — ABNORMAL HIGH (ref 70–99)
Glucose-Capillary: 183 mg/dL — ABNORMAL HIGH (ref 70–99)
Glucose-Capillary: 232 mg/dL — ABNORMAL HIGH (ref 70–99)

## 2019-12-13 LAB — POCT I-STAT 7, (LYTES, BLD GAS, ICA,H+H)
Acid-base deficit: 2 mmol/L (ref 0.0–2.0)
Bicarbonate: 22.7 mmol/L (ref 20.0–28.0)
Calcium, Ion: 0.9 mmol/L — ABNORMAL LOW (ref 1.15–1.40)
HCT: 17 % — ABNORMAL LOW (ref 36.0–46.0)
Hemoglobin: 5.8 g/dL — CL (ref 12.0–15.0)
O2 Saturation: 97 %
Potassium: 3 mmol/L — ABNORMAL LOW (ref 3.5–5.1)
Sodium: 154 mmol/L — ABNORMAL HIGH (ref 135–145)
TCO2: 24 mmol/L (ref 22–32)
pCO2 arterial: 36 mmHg (ref 32.0–48.0)
pH, Arterial: 7.409 (ref 7.350–7.450)
pO2, Arterial: 88 mmHg (ref 83.0–108.0)

## 2019-12-13 LAB — POCT ACTIVATED CLOTTING TIME: Activated Clotting Time: 268 seconds

## 2019-12-13 LAB — ANA W/REFLEX IF POSITIVE: Anti Nuclear Antibody (ANA): NEGATIVE

## 2019-12-13 LAB — PHOSPHORUS
Phosphorus: 3.7 mg/dL (ref 2.5–4.6)
Phosphorus: 4.1 mg/dL (ref 2.5–4.6)

## 2019-12-13 LAB — RHEUMATOID FACTOR: Rheumatoid fact SerPl-aCnc: 47 IU/mL — ABNORMAL HIGH (ref 0.0–13.9)

## 2019-12-13 LAB — PREPARE RBC (CROSSMATCH)

## 2019-12-13 LAB — MAGNESIUM
Magnesium: 2.4 mg/dL (ref 1.7–2.4)
Magnesium: 2.2 mg/dL (ref 1.7–2.4)

## 2019-12-13 LAB — HEPARIN LEVEL (UNFRACTIONATED): Heparin Unfractionated: 0.55 IU/mL (ref 0.30–0.70)

## 2019-12-13 LAB — TRIGLYCERIDES: Triglycerides: 176 mg/dL — ABNORMAL HIGH (ref <150)

## 2019-12-13 SURGERY — RIGHT/LEFT HEART CATH AND CORONARY ANGIOGRAPHY
Anesthesia: LOCAL

## 2019-12-13 MED ORDER — HEPARIN SODIUM (PORCINE) 1000 UNIT/ML IJ SOLN
INTRAMUSCULAR | Status: DC | PRN
  Administered 2019-12-13: 4000 [IU] via INTRAVENOUS

## 2019-12-13 MED ORDER — FREE WATER
200.0000 mL | Status: DC
  Administered 2019-12-13 – 2019-12-17 (×23): 200 mL

## 2019-12-13 MED ORDER — POTASSIUM CHLORIDE 20 MEQ PO PACK
40.0000 meq | PACK | Freq: Once | ORAL | Status: AC
  Administered 2019-12-13: 40 meq
  Filled 2019-12-13: qty 2

## 2019-12-13 MED ORDER — FENTANYL CITRATE (PF) 100 MCG/2ML IJ SOLN
INTRAMUSCULAR | Status: AC
  Filled 2019-12-13: qty 2

## 2019-12-13 MED ORDER — VERAPAMIL HCL 2.5 MG/ML IV SOLN
INTRAVENOUS | Status: DC | PRN
  Administered 2019-12-13: 10 mL via INTRA_ARTERIAL

## 2019-12-13 MED ORDER — CLOPIDOGREL BISULFATE 75 MG PO TABS
75.0000 mg | ORAL_TABLET | Freq: Every day | ORAL | Status: DC
  Filled 2019-12-13: qty 1

## 2019-12-13 MED ORDER — FENTANYL CITRATE (PF) 100 MCG/2ML IJ SOLN
INTRAMUSCULAR | Status: DC | PRN
Start: 2019-12-13 — End: 2019-12-13
  Administered 2019-12-13: 25 ug via INTRAVENOUS

## 2019-12-13 MED ORDER — IOHEXOL 350 MG/ML SOLN
INTRAVENOUS | Status: DC | PRN
  Administered 2019-12-13: 60 mL

## 2019-12-13 MED ORDER — HEPARIN (PORCINE) IN NACL 1000-0.9 UT/500ML-% IV SOLN
INTRAVENOUS | Status: AC
  Filled 2019-12-13: qty 1000

## 2019-12-13 MED ORDER — NITROGLYCERIN 1 MG/10 ML FOR IR/CATH LAB
INTRA_ARTERIAL | Status: AC
  Filled 2019-12-13: qty 10

## 2019-12-13 MED ORDER — MIDAZOLAM HCL 2 MG/2ML IJ SOLN
INTRAMUSCULAR | Status: DC | PRN
  Administered 2019-12-13: 2 mg via INTRAVENOUS

## 2019-12-13 MED ORDER — IOHEXOL 350 MG/ML SOLN
INTRAVENOUS | Status: DC | PRN
  Administered 2019-12-13: 70 mL via INTRA_ARTERIAL

## 2019-12-13 MED ORDER — CLOPIDOGREL BISULFATE 300 MG PO TABS
ORAL_TABLET | ORAL | Status: AC
  Filled 2019-12-13: qty 2

## 2019-12-13 MED ORDER — SODIUM CHLORIDE 0.9 % IV SOLN
250.0000 mL | INTRAVENOUS | Status: DC | PRN
  Administered 2019-12-16: 250 mL via INTRAVENOUS

## 2019-12-13 MED ORDER — NITROGLYCERIN 1 MG/10 ML FOR IR/CATH LAB
INTRA_ARTERIAL | Status: DC | PRN
  Administered 2019-12-13: 100 ug via INTRACORONARY

## 2019-12-13 MED ORDER — SODIUM CHLORIDE 0.9% FLUSH: 3.0000 mL | Freq: Two times a day (BID) | INTRAVENOUS | Status: DC

## 2019-12-13 MED ORDER — VERAPAMIL HCL 2.5 MG/ML IV SOLN
INTRAVENOUS | Status: AC
  Filled 2019-12-13: qty 2

## 2019-12-13 MED ORDER — CHLORHEXIDINE GLUCONATE 0.12 % MT SOLN
OROMUCOSAL | Status: AC
  Filled 2019-12-13: qty 15

## 2019-12-13 MED ORDER — LIDOCAINE HCL (PF) 1 % IJ SOLN
INTRAMUSCULAR | Status: DC | PRN
  Administered 2019-12-13 (×2): 2 mL

## 2019-12-13 MED ORDER — HEPARIN SODIUM (PORCINE) 1000 UNIT/ML IJ SOLN
INTRAMUSCULAR | Status: AC
  Filled 2019-12-13: qty 1

## 2019-12-13 MED ORDER — ASPIRIN 81 MG PO CHEW: 81.0000 mg | CHEWABLE_TABLET | ORAL | Status: DC

## 2019-12-13 MED ORDER — LIDOCAINE HCL (PF) 1 % IJ SOLN
INTRAMUSCULAR | Status: AC
  Filled 2019-12-13: qty 30

## 2019-12-13 MED ORDER — SODIUM CHLORIDE 0.9 % IV SOLN: 250.0000 mL | INTRAVENOUS | Status: DC | PRN

## 2019-12-13 MED ORDER — CLOPIDOGREL BISULFATE 300 MG PO TABS
ORAL_TABLET | ORAL | Status: DC | PRN
  Administered 2019-12-13: 600 mg via ORAL

## 2019-12-13 MED ORDER — ENOXAPARIN SODIUM 40 MG/0.4ML ~~LOC~~ SOLN: 40.0000 mg | SUBCUTANEOUS | Status: DC

## 2019-12-13 MED ORDER — SODIUM CHLORIDE 0.9% IV SOLUTION: Freq: Once | INTRAVENOUS | Status: AC

## 2019-12-13 MED ORDER — SODIUM CHLORIDE 0.9 % IV SOLN: INTRAVENOUS | Status: DC

## 2019-12-13 MED ORDER — MIDAZOLAM HCL 2 MG/2ML IJ SOLN
INTRAMUSCULAR | Status: AC
  Filled 2019-12-13: qty 2

## 2019-12-13 MED ORDER — SODIUM CHLORIDE 0.9% FLUSH
3.0000 mL | Freq: Two times a day (BID) | INTRAVENOUS | Status: DC
  Administered 2019-12-13 – 2019-12-17 (×8): 3 mL via INTRAVENOUS

## 2019-12-13 MED ORDER — HEPARIN (PORCINE) IN NACL 1000-0.9 UT/500ML-% IV SOLN
INTRAVENOUS | Status: DC | PRN
  Administered 2019-12-13 (×2): 500 mL

## 2019-12-13 MED ORDER — SODIUM CHLORIDE 0.9 % IV SOLN: INTRAVENOUS | Status: AC

## 2019-12-13 MED ORDER — SODIUM CHLORIDE 0.9% FLUSH: 3.0000 mL | INTRAVENOUS | Status: DC | PRN

## 2019-12-13 SURGICAL SUPPLY — 18 items
BALLN SAPPHIRE 1.5X12 (BALLOONS) ×2
BALLN SAPPHIRE ~~LOC~~ 2.25X12 (BALLOONS) ×1 IMPLANT
BALLOON SAPPHIRE 1.5X12 (BALLOONS) IMPLANT
CATH 5FR JL3.5 JR4 ANG PIG MP (CATHETERS) ×2 IMPLANT
CATH BALLN WEDGE 5F 110CM (CATHETERS) ×2 IMPLANT
CATH VISTA GUIDE 6FR JR4 (CATHETERS) ×1 IMPLANT
DEVICE RAD COMP TR BAND LRG (VASCULAR PRODUCTS) ×1 IMPLANT
GLIDESHEATH SLEND SS 6F .021 (SHEATH) ×2 IMPLANT
GUIDEWIRE INQWIRE 1.5J.035X260 (WIRE) ×1 IMPLANT
INQWIRE 1.5J .035X260CM (WIRE) ×2
KIT ENCORE 26 ADVANTAGE (KITS) ×1 IMPLANT
KIT MICROPUNCTURE NIT STIFF (SHEATH) ×2 IMPLANT
PACK CARDIAC CATHETERIZATION (CUSTOM PROCEDURE TRAY) ×2 IMPLANT
SHEATH GLIDE SLENDER 4/5FR (SHEATH) ×2 IMPLANT
SHEATH PROBE COVER 6X72 (BAG) ×3 IMPLANT
STENT RESOLUTE ONYX 2.0X15 (Permanent Stent) ×1 IMPLANT
TRANSDUCER W/STOPCOCK (MISCELLANEOUS) ×2 IMPLANT
WIRE RUNTHROUGH .014X180CM (WIRE) ×2 IMPLANT

## 2019-12-13 NOTE — Progress Notes (Signed)
SLP Cancellation Note  Patient Details Name: [name] Bean MRN: [id number] DOB: 1950-11-08   Cancelled treatment:        Pt intubated and sedated. Will follow   Houston Siren 01/06/2020, 7:56 AM   Orbie Pyo Colvin Caroli.Ed Risk analyst [phone] Office [phone]

## 2019-12-13 NOTE — Progress Notes (Signed)
Advanced Heart Failure Rounding Note   Subjective:    Awake on vent. Following commands. On NE 5.  No co-ox drawn.   EF 45% on echo    Objective:   Weight Range:  Vital Signs:   Temp:  [97.9 F (36.6 C)-103.3 F (39.6 C)] 99.7 F (37.6 C) (09/01 0729) Pulse Rate:  [66-133] 68 (09/01 0800) Resp:  [0-36] 22 (09/01 0800) BP: (89-154)/(39-107) 128/44 (09/01 0800) SpO2:  [81 %-100 %] 99 % (09/01 0802) Arterial Line BP: (117-156)/(49-65) 151/65 (08/31 1015) FiO2 (%):  [80 %-100 %] 80 % (09/01 0802) Weight:  [86.1 kg] 86.1 kg (09/01 0500) Last BM Date: 12/12/19  Weight change: Filed Weights   12/10/19 0400 12/11/19 0500 12/28/2019 0500  Weight: 91.5 kg 84.5 kg 86.1 kg    Intake/Output:   Intake/Output Summary (Last 24 hours) at 12/24/2019 0914 Last data filed at 01/09/2020 0800 Gross per 24 hour  Intake 1829.34 ml  Output 1250 ml  Net 579.34 ml     Physical Exam: General:  Awake on vent. Following commands HEENT: normal + ETT Neck: supple. JVP 7-8 . Carotids 2+ bilat; no bruits. No lymphadenopathy or thryomegaly appreciated. Cor: PMI nondisplaced. Regular rate & rhythm. No rubs, gallops or murmurs. Lungs: clear Abdomen: soft, nontender, nondistended. No hepatosplenomegaly. No bruits or masses. Good bowel sounds. Extremities: no cyanosis, clubbing, rash, edema Neuro: awake following commands   Telemetry: Sinus 60-70s Personally reviewed   Labs: Basic Metabolic Panel: Recent Labs  Lab 12/07/19 0304 12/07/19 0315 12/07/19 1706 12/08/19 0324 12/10/19 0450 12/10/19 0450 12/11/19 0353 12/11/19 0353 12/12/19 0520 12/12/19 1147 12/12/19 1237 12/12/19 2154 01/08/2020 0358  NA 140   < >  --    < > 145   < > 147*  --  150* 151* 148*  --  149*  K 3.7   < >  --    < > 3.8   < > 3.0*  --  3.2* 3.7 4.2  --  3.5  CL 108   < >  --    < > 109  --  107  --  110  --  108  --  112*  CO2 22   < >  --    < > 30  --  26  --  28  --  26  --  25  GLUCOSE 153*   < >  --    <  > 214*  --  141*  --  108*  --  248*  --  171*  BUN 13   < >  --    < > 28*  --  28*  --  23  --  26*  --  40*  CREATININE 0.90   < >  --    < > 1.10*  --  1.14*  --  1.00  --  1.18*  --  1.46*  CALCIUM 8.0*   < >  --    < > 8.4*   < > 8.7*   < > 8.9  --  8.6*  --  8.4*  MG 2.1   < > 1.8  --  2.2  --  2.3  --   --   --  2.0 2.0 2.4  PHOS 3.2  --  4.4  --   --   --   --   --   --   --  3.6 2.4* 3.7   < > = values in this  interval not displayed.    Liver Function Tests: Recent Labs  Lab 12/06/19 1605 12/07/19 0304 12/07/19 0928  AST 59* 56* 275*  ALT 24 24 214*  ALKPHOS 84 79 121  BILITOT 0.7 1.0 0.8  PROT 6.0* 5.9* 6.2*  ALBUMIN 2.8* 2.7* 2.7*   No results for input(s): LIPASE, AMYLASE in the last 168 hours. No results for input(s): AMMONIA in the last 168 hours.  CBC: Recent Labs  Lab 12/07/19 0928 12/07/19 1138 12/09/19 0521 12/09/19 0521 12/10/19 0450 12/11/19 0353 12/12/19 0520 12/12/19 1147 01/09/2020 0358  WBC 13.1*   < > 11.2*  --  11.2* 8.7 12.3*  --  17.5*  NEUTROABS 9.0*  --   --   --   --   --   --   --   --   HGB 12.8   < > 9.1*   < > 8.3* 9.0* 8.5* 8.5* 7.9*  HCT 39.1   < > 28.9*   < > 26.7* 28.0* 24.7* 25.0* 25.6*  MCV 96.5   < > 96.0  --  98.5 100.0 100.0  --  101.2*  PLT PLATELET CLUMPS NOTED ON SMEAR, UNABLE TO ESTIMATE   < > 142*  --  142* 183 249  --  310   < > = values in this interval not displayed.    Cardiac Enzymes: No results for input(s): CKTOTAL, CKMB, CKMBINDEX, TROPONINI in the last 168 hours.  BNP: BNP (last 3 results) Recent Labs    12/08/19 0324 12/09/19 0521 12/10/19 0450  BNP 228.5* 136.4* 611.7*    ProBNP (last 3 results) No results for input(s): PROBNP in the last 8760 hours.    Other results:  Imaging: Portable Chest xray  Result Date: 01/09/2020 CLINICAL DATA:  Status post cardiac arrest. EXAM: PORTABLE CHEST 1 VIEW COMPARISON:  December 12, 2019. FINDINGS: The heart size and mediastinal contours are within normal  limits. Endotracheal and nasogastric tubes are unchanged in position. Left internal jugular catheter is unchanged in position. Stable bilateral lung opacities are noted concerning for multifocal pneumonia or edema. No pneumothorax is noted. Small left pleural effusion may be present. The visualized skeletal structures are unremarkable. IMPRESSION: Stable support apparatus. Stable bilateral lung opacities are noted concerning for multifocal pneumonia or edema. Electronically Signed   By: Marijo Conception M.D.   On: 12/24/2019 08:49   Portable Chest x-ray  Result Date: 12/12/2019 CLINICAL DATA:  Intubated, enteric tube placement after cardiac arrest EXAM: PORTABLE CHEST 1 VIEW COMPARISON:  Chest radiograph from one day prior. FINDINGS: Endotracheal tube tip is in the proximal right mainstem bronchus. Enteric tube enters stomach with the tip not seen on this image. Left internal jugular central venous catheter terminates in the lower third of the SVC. Stable cardiomediastinal silhouette with normal heart size. No pneumothorax. Possible trace bilateral pleural effusions. Extensive patchy opacities throughout both lungs, most prominent in left greater than right parahilar lungs, worsened. IMPRESSION: 1. Endotracheal tube tip in the proximal right mainstem bronchus. 2. Extensive patchy opacities throughout both lungs, most prominent in the left greater than right parahilar lungs, worsened, compatible with any combination of worsening pulmonary edema, aspiration or atelectasis. Critical Value/emergent results were called by telephone at the time of interpretation on 12/12/2019 at 10:24 am to provider [facility] , who verbally acknowledged these results. Electronically Signed   By: Ilona Sorrel M.D.   On: 12/12/2019 10:28   DG Swallowing Func-Speech Pathology  Result Date: 12/11/2019 Objective Swallowing Evaluation: Type of  Study: MBS-Modified Barium Swallow Study  Patient Details Name: Kylia Grajales MRN:  [id number] Date of Birth: [birth year] Today's Date: 12/11/2019 Time: SLP Start Time (ACUTE ONLY): 1318 -SLP Stop Time (ACUTE ONLY): 1340 SLP Time Calculation (min) (ACUTE ONLY): 22 min Past Medical History: Past Medical History: Diagnosis Date . Anemia  . Anxiety  . Arthritis  . Asthma  . Broken arm   left arm . Bronchitis  . Bursitis of left hip  . CAD (coronary artery disease)  . Candidiasis, vagina  . Cardiac arrest ([place]) 06/20/2016  at [facility]; ? due to flash pulm edema vs undertreated chronic CHF . Cerumen impaction  . CHF (congestive heart failure) ([place])   RECENT ADMIT TO [facility]  . Colon, diverticulosis  . Depression  . Diabetes mellitus   15 YRS AGO . Fatigue  . Gastroenteritis  . Hot flashes, menopausal  . Hyperlipidemia  . Knee pain, left  . Lumbar back pain  . Maxillary sinusitis   history . Muscle tear   right gluteus . Nausea  . Nicotine dependence  . OSA (obstructive sleep apnea) 10/16/2017 . Other B-complex deficiencies  . Otitis media, acute   left . Peripheral neuropathy  . Spinal stenosis of lumbar region  . Tubulovillous adenoma of colon  . Unspecified essential hypertension  Past Surgical History: Past Surgical History: Procedure Laterality Date . CARDIAC CATHETERIZATION  2015, 2009, 2006 . CERVICAL SPINE SURGERY  2003 . CORONARY ANGIOPLASTY WITH STENT PLACEMENT  2002, 2015 . DILATATION & CURETTAGE/HYSTEROSCOPY WITH MYOSURE N/A 10/03/2019  Procedure: DILATATION & CURETTAGE/HYSTEROSCOPY WITH MYOSURE;  Surgeon: [name], MD;  Location: [place];  Service: Gynecology;  Laterality: N/A;  request 7:30am OR time in [facility] Gynecology requests 30 minutes OR time . EYE SURGERY    cataracts bilaterally . POSTERIOR CERVICAL LAMINECTOMY N/A 10/23/2016  Procedure: POSTERIOR CERVICAL LAMINECTOMY MULTI LEVEL CERVICAL TWO- CERVICAL THREE, CERVICAL THREE- CERVICAL FOUR;  Surgeon: Ashok Pall, MD;  Location: [place];  Service: Neurosurgery;  Laterality: N/A;  POSTERIOR HPI: Pt is a42 y.o.female  with PMH significant for HTN, HLD, DM, CHF, cardiac arrest, CAD, posterior cervical laminectomy 2018, and anxiety who presented to the ED s/p cardiac arrest. Per report patient called EMS with acute complaints of SOB and found unresponsive in PEA arrest. EMS provided 3 rounds of CPR and obtained ROSC prior to ED arrival. No ACLS medications were given. ETT 8/24-8/26. CXR 8/30: Stable probable component of interstitial edema and areas of bilateral atelectasis. Possible small bilateral pleural effusions. Per order, RN swallow eval completed but pt persistently coughing with swallowing trials on 8/29  No data recorded Assessment / Plan / Recommendation CHL IP CLINICAL IMPRESSIONS 12/11/2019 Clinical Impression  Pt presents with pharyngeal dysphagia characterized by a pharyngeal delay, reduced lingual retraction, and reduced anterior laryngeal movement. Pt's swallow was triggered with the head of the bolus at the level of valleculae and pyriform sinuses. She demonstrated vallecular residue and pyriform sinus residue which was mild to moderate depending on bolus size and consistency. Pt exhibited aspiration (PAS 7) with thin liquids and penetration (PAS 3) and ultimate aspiration (PAS 7, 8) was also noted with nectar thick liquids. Most instances of laryngeal invasion resulted in throat clearing and/or coughing. This was effective in propelling the aspirant superiorly but subsequent aspiration was inconsistently noted. Postural modifications were attempted but no functional benefit was noted. Use of an effortful swallow inconsistently eliminated laryngeal invasion with nectar thick liquids but not with thin liquids. A dysphagia 2 diet with  honey thick liquids is recommended at this time with allowance of ice chips between meals following oral care. SLP SLP Visit Diagnosis Dysphagia, pharyngeal phase (R13.13) Attention and concentration deficit following -- Frontal lobe and executive function deficit following -- Impact on  safety and function Mild aspiration risk;Moderate aspiration risk   CHL IP TREATMENT RECOMMENDATION 12/11/2019 Treatment Recommendations Therapy as outlined in treatment plan below   Prognosis 12/11/2019 Prognosis for Safe Diet Advancement Good Barriers to Reach Goals Time post onset Barriers/Prognosis Comment -- CHL IP DIET RECOMMENDATION 12/11/2019 SLP Diet Recommendations Dysphagia 2 (Fine chop) solids;Honey thick liquids Liquid Administration via Cup;Straw Medication Administration Whole meds with liquid Compensations Slow rate;Small sips/bites;Follow solids with liquid;Clear throat intermittently Postural Changes Seated upright at 90 degrees   CHL IP OTHER RECOMMENDATIONS 12/11/2019 Recommended Consults -- Oral Care Recommendations Oral care BID Other Recommendations Order thickener from pharmacy   CHL IP FOLLOW UP RECOMMENDATIONS 12/11/2019 Follow up Recommendations Home health SLP   CHL IP FREQUENCY AND DURATION 12/11/2019 Speech Therapy Frequency (ACUTE ONLY) min 2x/week Treatment Duration 2 weeks      CHL IP ORAL PHASE 12/11/2019 Oral Phase WFL Oral - Pudding Teaspoon -- Oral - Pudding Cup -- Oral - Honey Teaspoon -- Oral - Honey Cup -- Oral - Nectar Teaspoon -- Oral - Nectar Cup -- Oral - Nectar Straw -- Oral - Thin Teaspoon -- Oral - Thin Cup -- Oral - Thin Straw -- Oral - Puree -- Oral - Mech Soft -- Oral - Regular -- Oral - Multi-Consistency -- Oral - Pill -- Oral Phase - Comment --  CHL IP PHARYNGEAL PHASE 12/11/2019 Pharyngeal Phase Impaired Pharyngeal- Pudding Teaspoon -- Pharyngeal -- Pharyngeal- Pudding Cup -- Pharyngeal -- Pharyngeal- Honey Teaspoon -- Pharyngeal -- Pharyngeal- Honey Cup Reduced anterior laryngeal mobility;Delayed swallow initiation-vallecula;Delayed swallow initiation-pyriform sinuses;Pharyngeal residue - valleculae Pharyngeal Material does not enter airway Pharyngeal- Nectar Teaspoon -- Pharyngeal -- Pharyngeal- Nectar Cup Reduced anterior laryngeal mobility;Delayed swallow  initiation-vallecula;Delayed swallow initiation-pyriform sinuses;Penetration/Aspiration before swallow;Penetration/Aspiration during swallow;Penetration/Apiration after swallow;Trace aspiration;Pharyngeal residue - valleculae Pharyngeal Material enters airway, remains ABOVE vocal cords and not ejected out;Material enters airway, passes BELOW cords and not ejected out despite cough attempt by patient Pharyngeal- Nectar Straw -- Pharyngeal -- Pharyngeal- Thin Teaspoon -- Pharyngeal -- Pharyngeal- Thin Cup Reduced anterior laryngeal mobility;Delayed swallow initiation-vallecula;Delayed swallow initiation-pyriform sinuses;Penetration/Aspiration before swallow;Penetration/Aspiration during swallow;Penetration/Apiration after swallow;Trace aspiration;Pharyngeal residue - valleculae Pharyngeal Material enters airway, passes BELOW cords and not ejected out despite cough attempt by patient Pharyngeal- Thin Straw -- Pharyngeal -- Pharyngeal- Puree Reduced anterior laryngeal mobility;Delayed swallow initiation-vallecula;Delayed swallow initiation-pyriform sinuses;Pharyngeal residue - valleculae Pharyngeal -- Pharyngeal- Mechanical Soft Reduced anterior laryngeal mobility;Delayed swallow initiation-vallecula;Delayed swallow initiation-pyriform sinuses;Pharyngeal residue - valleculae Pharyngeal -- Pharyngeal- Regular Reduced anterior laryngeal mobility;Delayed swallow initiation-vallecula;Delayed swallow initiation-pyriform sinuses;Pharyngeal residue - valleculae Pharyngeal -- Pharyngeal- Multi-consistency -- Pharyngeal -- Pharyngeal- Pill Reduced anterior laryngeal mobility;Delayed swallow initiation-vallecula;Delayed swallow initiation-pyriform sinuses;Pharyngeal residue - valleculae Pharyngeal -- Pharyngeal Comment --  CHL IP CERVICAL ESOPHAGEAL PHASE 12/11/2019 Cervical Esophageal Phase WFL Pudding Teaspoon -- Pudding Cup -- Honey Teaspoon -- Honey Cup -- Nectar Teaspoon -- Nectar Cup -- Nectar Straw -- Thin Teaspoon -- Thin  Cup -- Thin Straw -- Puree -- Mechanical Soft -- Regular -- Multi-consistency -- Pill -- Cervical Esophageal Comment -- Shanika I. Hardin Negus, [place], [place] Office number [phone] Pager [place] 12/11/2019, 2:42 PM              ECHOCARDIOGRAM LIMITED  Result Date: 12/12/2019  ECHOCARDIOGRAM LIMITED REPORT   Patient Name:   ESME DURKIN Dom Date of Exam: 12/12/2019 Medical Rec #:  [id number]                Height:       62.0 in Accession #:    [id number]               Weight:       186.3 lb Date of Birth:  Sep 11, 1950                 BSA:          1.855 m Patient Age:    69 years                 BP:           137/66 mmHg Patient Gender: F                        HR:           98 bpm. Exam Location:  Inpatient Procedure: Limited Echo, Limited Color Doppler, Cardiac Doppler and Intracardiac            Opacification Agent Indications:    Acute Respiratory Insufficiency 518.82 / R06.89  History:        Patient has prior history of Echocardiogram examinations, most                 recent 11/29/2019. CHF, CAD; Risk Factors:Hypertension, Diabetes                 and Dyslipidemia. Cardiac arrest.  Sonographer:    Darlina Sicilian RDCS Referring Phys: [id number] Candee Furbish  Sonographer Comments: Echo performed with patient supine and on artificial respirator. IMPRESSIONS  1. Left ventricular ejection fraction, by estimation, is 45%. The left ventricle has mildly decreased function. Left ventricular endocardial border not optimally defined to evaluate regional wall motion. Left ventricular diastolic parameters are consistent with Grade I diastolic dysfunction (impaired relaxation).  2. Right ventricular systolic function was not well visualized. The right ventricular size is not well visualized.  3. The mitral valve was not well visualized. No evidence of mitral valve regurgitation. No evidence of mitral stenosis.  4. The aortic valve is tricuspid. Aortic valve  regurgitation is not visualized. Mild aortic valve sclerosis is present, with no evidence of aortic valve stenosis.  5. IVC not well-visualized.  6. Technically difficult study with very poor images. Would repeat when patient is more stable. FINDINGS  Left Ventricle: Left ventricular ejection fraction, by estimation, is 45%. The left ventricle has mildly decreased function. Left ventricular endocardial border not optimally defined to evaluate regional wall motion. Definity contrast agent was given IV  to delineate the left ventricular endocardial borders. The left ventricular internal cavity size was normal in size. There is no left ventricular hypertrophy. Right Ventricle: The right ventricular size is not well visualized. Right ventricular systolic function was not well visualized. Pericardium: There is no evidence of pericardial effusion. Mitral Valve: The mitral valve was not well visualized. Mild mitral annular calcification. No evidence of mitral valve stenosis. Aortic Valve: The aortic valve is tricuspid. Aortic valve regurgitation is not visualized. Mild aortic valve sclerosis is present, with no evidence of aortic valve stenosis. Pulmonic Valve: The pulmonic valve was not well visualized. Pulmonic valve regurgitation is not visualized. Venous: The inferior vena cava was not well visualized.  LV Volumes (MOD) LV vol d, MOD A2C: 79.1  ml Diastology LV vol d, MOD A4C: 69.4 ml LV e' lateral:   4.80 cm/s LV vol s, MOD A2C: 36.5 ml LV E/e' lateral: 16.6 LV vol s, MOD A4C: 44.0 ml LV e' medial:    2.93 cm/s LV SV MOD A2C:     42.6 ml LV E/e' medial:  27.2 LV SV MOD A4C:     69.4 ml LV SV MOD BP:      36.6 ml AORTIC VALVE LVOT Vmax:   89.40 cm/s LVOT Vmean:  53.200 cm/s LVOT VTI:    0.154 m MITRAL VALVE MV Area (PHT): 2.99 cm    SHUNTS MV Decel Time: 254 msec    Systemic VTI: 0.15 m MV E velocity: 79.70 cm/s MV A velocity: 95.50 cm/s MV E/A ratio:  0.83 Loralie Champagne MD Electronically signed by Loralie Champagne MD  Signature Date/Time: 12/12/2019/4:32:45 PM    Final       Medications:     Scheduled Medications: . aspirin  81 mg Per Tube Daily  . chlorhexidine gluconate (MEDLINE KIT)  15 mL Mouth Rinse BID  . Chlorhexidine Gluconate Cloth  6 each Topical Daily  . feeding supplement (PROSource TF)  45 mL Per Tube BID  . insulin aspart  3-9 Units Subcutaneous Q4H  . insulin detemir  12 Units Subcutaneous Q12H  . mouth rinse  15 mL Mouth Rinse 10 times per day  . pantoprazole (PROTONIX) IV  40 mg Intravenous Daily  . rosuvastatin  20 mg Per Tube Daily  . sertraline  50 mg Per Tube Daily  . sodium chloride flush  10 mL Intracatheter Q12H     Infusions: . ceFEPime (MAXIPIME) IV 2 g (12/28/2019 0815)  . dexmedetomidine (PRECEDEX) IV infusion 0.8 mcg/kg/hr (12/26/2019 0429)  . feeding supplement (VITAL AF 1.2 CAL) 1,000 mL (12/12/19 1705)  . fentaNYL infusion INTRAVENOUS 75 mcg/hr (12/21/2019 0429)  . heparin 1,500 Units/hr (01/06/2020 0429)  . norepinephrine (LEVOPHED) Adult infusion 5 mcg/min (12/28/2019 0429)  . propofol (DIPRIVAN) infusion       PRN Medications:  acetaminophen (TYLENOL) oral liquid 160 mg/5 mL, fentaNYL, fentaNYL (SUBLIMAZE) injection, fentaNYL (SUBLIMAZE) injection, ipratropium-albuterol, labetalol, ondansetron (ZOFRAN) IV, Resource ThickenUp Clear   Assessment/Plan :   1. PEA Arrest x 2 - suspect due to hypoxia, ? Pulmonary edema vs aspiration  Had previous PEA arrest in 2018 due to pulmonary edema.  - EF 60% -> 45% - Plan R/L Cath today while intubated  2. Acute Hypoxic Respiratory Failure - 8/24 intubated --> extubated 8/26 -->reintubated 8/26 -->extubated 8/29--> reintubated 12/12/19. CXR concerning for ARDs +/- edema - Respiratory Cx: klebsiella --> on rocephin   - S/P Bronch 8/31with mucus plug possible food   3. Shock - co-ox yesterday 45% - suspect mix of sepsis/cardiogenic - weaning NE - repeat co-ox - R/L cath today   4. A/C Diastolic HF  -ECHO 3/87/5643  EF 55-60% RV normal -> 12/12/19 EF 45% - Volume status improved. Cath today  5. NSTEMI  H/O CAD with S/P PCI Stent to RCA 2002 and distal LAD 2015 - HS Trop [id number] - On statin - Plan for cath as above  6. DMII On SSI    CRITICAL CARE Performed by: Glori Bickers  Total critical care time: 35 minutes  Critical care time was exclusive of separately billable procedures and treating other patients.  Critical care was necessary to treat or prevent imminent or life-threatening deterioration.  Critical care was time spent personally by me (independent of midlevel providers or  residents) on the following activities: development of treatment plan with patient and/or surrogate as well as nursing, discussions with consultants, evaluation of patient's response to treatment, examination of patient, obtaining history from patient or surrogate, ordering and performing treatments and interventions, ordering and review of laboratory studies, ordering and review of radiographic studies, pulse oximetry and re-evaluation of patient's condition.   Length of Stay: 8   Glori Bickers MD 12/15/2019, 9:14 AM  Advanced Heart Failure Team Pager [phone] (M-F; 7a - 4p)  Please contact [facility] for night-coverage after hours (4p -7a ) and weekends on [email or web address]

## 2019-12-13 NOTE — Interval H&P Note (Signed)
History and Physical Interval Note:  12/23/2019 2:12 PM  [name] Bean  has presented today for surgery, with the diagnosis of post cardiac arrest.  The various methods of treatment have been discussed with the patient and family. After consideration of risks, benefits and other options for treatment, the patient has consented to  Procedure(s): RIGHT/LEFT HEART CATH AND CORONARY ANGIOGRAPHY (N/A) and possible coronary angioplasty as a surgical intervention.  The patient's history has been reviewed, patient examined, no change in status, stable for surgery.  I have reviewed the patient's chart and labs.  Questions were answered to the patient's satisfaction.     Costella Schwarz

## 2019-12-13 NOTE — Progress Notes (Signed)
CRITICAL VALUE ALERT  Critical Value: HGB 6.8  Date & Time Notied:  12/24/2019 1752  Provider Notified: Pierre Bali  Orders Received/Actions taken: Stat CBC and typing screen, do not restart Heparin  [name] RN

## 2019-12-13 NOTE — H&P (View-Only) (Signed)
Advanced Heart Failure Rounding Note   Subjective:    Awake on vent. Following commands. On NE 5.  No co-ox drawn.   EF 45% on echo    Objective:   Weight Range:  Vital Signs:   Temp:  [97.9 F (36.6 C)-103.3 F (39.6 C)] 99.7 F (37.6 C) (09/01 0729) Pulse Rate:  [66-133] 68 (09/01 0800) Resp:  [0-36] 22 (09/01 0800) BP: (89-154)/(39-107) 128/44 (09/01 0800) SpO2:  [81 %-100 %] 99 % (09/01 0802) Arterial Line BP: (117-156)/(49-65) 151/65 (08/31 1015) FiO2 (%):  [80 %-100 %] 80 % (09/01 0802) Weight:  [86.1 kg] 86.1 kg (09/01 0500) Last BM Date: 12/12/19  Weight change: Filed Weights   12/10/19 0400 12/11/19 0500 01/03/2020 0500  Weight: 91.5 kg 84.5 kg 86.1 kg    Intake/Output:   Intake/Output Summary (Last 24 hours) at 12/30/2019 0914 Last data filed at 12/29/2019 0800 Gross per 24 hour  Intake 1829.34 ml  Output 1250 ml  Net 579.34 ml     Physical Exam: General:  Awake on vent. Following commands HEENT: normal + ETT Neck: supple. JVP 7-8 . Carotids 2+ bilat; no bruits. No lymphadenopathy or thryomegaly appreciated. Cor: PMI nondisplaced. Regular rate & rhythm. No rubs, gallops or murmurs. Lungs: clear Abdomen: soft, nontender, nondistended. No hepatosplenomegaly. No bruits or masses. Good bowel sounds. Extremities: no cyanosis, clubbing, rash, edema Neuro: awake following commands   Telemetry: Sinus 60-70s Personally reviewed   Labs: Basic Metabolic Panel: Recent Labs  Lab 12/07/19 0304 12/07/19 0315 12/07/19 1706 12/08/19 0324 12/10/19 0450 12/10/19 0450 12/11/19 0353 12/11/19 0353 12/12/19 0520 12/12/19 1147 12/12/19 1237 12/12/19 2154 12/23/2019 0358  NA 140   < >  --    < > 145   < > 147*  --  150* 151* 148*  --  149*  K 3.7   < >  --    < > 3.8   < > 3.0*  --  3.2* 3.7 4.2  --  3.5  CL 108   < >  --    < > 109  --  107  --  110  --  108  --  112*  CO2 22   < >  --    < > 30  --  26  --  28  --  26  --  25  GLUCOSE 153*   < >  --    <  > 214*  --  141*  --  108*  --  248*  --  171*  BUN 13   < >  --    < > 28*  --  28*  --  23  --  26*  --  40*  CREATININE 0.90   < >  --    < > 1.10*  --  1.14*  --  1.00  --  1.18*  --  1.46*  CALCIUM 8.0*   < >  --    < > 8.4*   < > 8.7*   < > 8.9  --  8.6*  --  8.4*  MG 2.1   < > 1.8  --  2.2  --  2.3  --   --   --  2.0 2.0 2.4  PHOS 3.2  --  4.4  --   --   --   --   --   --   --  3.6 2.4* 3.7   < > = values in this  interval not displayed.    Liver Function Tests: Recent Labs  Lab 12/06/19 1605 12/07/19 0304 12/07/19 0928  AST 59* 56* 275*  ALT 24 24 214*  ALKPHOS 84 79 121  BILITOT 0.7 1.0 0.8  PROT 6.0* 5.9* 6.2*  ALBUMIN 2.8* 2.7* 2.7*   No results for input(s): LIPASE, AMYLASE in the last 168 hours. No results for input(s): AMMONIA in the last 168 hours.  CBC: Recent Labs  Lab 12/07/19 0928 12/07/19 1138 12/09/19 0521 12/09/19 0521 12/10/19 0450 12/11/19 0353 12/12/19 0520 12/12/19 1147 01/06/2020 0358  WBC 13.1*   < > 11.2*  --  11.2* 8.7 12.3*  --  17.5*  NEUTROABS 9.0*  --   --   --   --   --   --   --   --   HGB 12.8   < > 9.1*   < > 8.3* 9.0* 8.5* 8.5* 7.9*  HCT 39.1   < > 28.9*   < > 26.7* 28.0* 24.7* 25.0* 25.6*  MCV 96.5   < > 96.0  --  98.5 100.0 100.0  --  101.2*  PLT PLATELET CLUMPS NOTED ON SMEAR, UNABLE TO ESTIMATE   < > 142*  --  142* 183 249  --  310   < > = values in this interval not displayed.    Cardiac Enzymes: No results for input(s): CKTOTAL, CKMB, CKMBINDEX, TROPONINI in the last 168 hours.  BNP: BNP (last 3 results) Recent Labs    12/08/19 0324 12/09/19 0521 12/10/19 0450  BNP 228.5* 136.4* 611.7*    ProBNP (last 3 results) No results for input(s): PROBNP in the last 8760 hours.    Other results:  Imaging: Portable Chest xray  Result Date: 01/09/2020 CLINICAL DATA:  Status post cardiac arrest. EXAM: PORTABLE CHEST 1 VIEW COMPARISON:  December 12, 2019. FINDINGS: The heart size and mediastinal contours are within normal  limits. Endotracheal and nasogastric tubes are unchanged in position. Left internal jugular catheter is unchanged in position. Stable bilateral lung opacities are noted concerning for multifocal pneumonia or edema. No pneumothorax is noted. Small left pleural effusion may be present. The visualized skeletal structures are unremarkable. IMPRESSION: Stable support apparatus. Stable bilateral lung opacities are noted concerning for multifocal pneumonia or edema. Electronically Signed   By: Lupita Raider M.D.   On: 01/09/2020 08:49   Portable Chest x-ray  Result Date: 12/12/2019 CLINICAL DATA:  Intubated, enteric tube placement after cardiac arrest EXAM: PORTABLE CHEST 1 VIEW COMPARISON:  Chest radiograph from one day prior. FINDINGS: Endotracheal tube tip is in the proximal right mainstem bronchus. Enteric tube enters stomach with the tip not seen on this image. Left internal jugular central venous catheter terminates in the lower third of the SVC. Stable cardiomediastinal silhouette with normal heart size. No pneumothorax. Possible trace bilateral pleural effusions. Extensive patchy opacities throughout both lungs, most prominent in left greater than right parahilar lungs, worsened. IMPRESSION: 1. Endotracheal tube tip in the proximal right mainstem bronchus. 2. Extensive patchy opacities throughout both lungs, most prominent in the left greater than right parahilar lungs, worsened, compatible with any combination of worsening pulmonary edema, aspiration or atelectasis. Critical Value/emergent results were called by telephone at the time of interpretation on 12/12/2019 at 10:24 am to provider [facility] , who verbally acknowledged these results. Electronically Signed   By: Delbert Phenix M.D.   On: 12/12/2019 10:28   DG Swallowing Func-Speech Pathology  Result Date: 12/11/2019 Objective Swallowing Evaluation: Type of  Study: MBS-Modified Barium Swallow Study  Patient Details Name: Leiann Sporer MRN:  [id number] Date of Birth: Jan 28, 1951 Today's Date: 12/11/2019 Time: SLP Start Time (ACUTE ONLY): 1318 -SLP Stop Time (ACUTE ONLY): 1340 SLP Time Calculation (min) (ACUTE ONLY): 22 min Past Medical History: Past Medical History: Diagnosis Date . Anemia  . Anxiety  . Arthritis  . Asthma  . Broken arm   left arm . Bronchitis  . Bursitis of left hip  . CAD (coronary artery disease)  . Candidiasis, vagina  . Cardiac arrest ([place]) 06/20/2016  at [facility]; ? due to flash pulm edema vs undertreated chronic CHF . Cerumen impaction  . CHF (congestive heart failure) ([place])   RECENT ADMIT TO [facility]  . Colon, diverticulosis  . Depression  . Diabetes mellitus   15 YRS AGO . Fatigue  . Gastroenteritis  . Hot flashes, menopausal  . Hyperlipidemia  . Knee pain, left  . Lumbar back pain  . Maxillary sinusitis   history . Muscle tear   right gluteus . Nausea  . Nicotine dependence  . OSA (obstructive sleep apnea) 10/16/2017 . Other B-complex deficiencies  . Otitis media, acute   left . Peripheral neuropathy  . Spinal stenosis of lumbar region  . Tubulovillous adenoma of colon  . Unspecified essential hypertension  Past Surgical History: Past Surgical History: Procedure Laterality Date . CARDIAC CATHETERIZATION  2015, 2009, 2006 . CERVICAL SPINE SURGERY  2003 . CORONARY ANGIOPLASTY WITH STENT PLACEMENT  2002, 2015 . DILATATION & CURETTAGE/HYSTEROSCOPY WITH MYOSURE N/A 10/03/2019  Procedure: DILATATION & CURETTAGE/HYSTEROSCOPY WITH MYOSURE;  Surgeon: [name], MD;  Location: [place];  Service: Gynecology;  Laterality: N/A;  request 7:30am OR time in [facility] Gynecology requests 30 minutes OR time . EYE SURGERY    cataracts bilaterally . POSTERIOR CERVICAL LAMINECTOMY N/A 10/23/2016  Procedure: POSTERIOR CERVICAL LAMINECTOMY MULTI LEVEL CERVICAL TWO- CERVICAL THREE, CERVICAL THREE- CERVICAL FOUR;  Surgeon: Ashok Pall, MD;  Location: [place];  Service: Neurosurgery;  Laterality: N/A;  POSTERIOR HPI: Pt is a81 y.o.female  with PMH significant for HTN, HLD, DM, CHF, cardiac arrest, CAD, posterior cervical laminectomy 2018, and anxiety who presented to the ED s/p cardiac arrest. Per report patient called EMS with acute complaints of SOB and found unresponsive in PEA arrest. EMS provided 3 rounds of CPR and obtained ROSC prior to ED arrival. No ACLS medications were given. ETT 8/24-8/26. CXR 8/30: Stable probable component of interstitial edema and areas of bilateral atelectasis. Possible small bilateral pleural effusions. Per order, RN swallow eval completed but pt persistently coughing with swallowing trials on 8/29  No data recorded Assessment / Plan / Recommendation CHL IP CLINICAL IMPRESSIONS 12/11/2019 Clinical Impression  Pt presents with pharyngeal dysphagia characterized by a pharyngeal delay, reduced lingual retraction, and reduced anterior laryngeal movement. Pt's swallow was triggered with the head of the bolus at the level of valleculae and pyriform sinuses. She demonstrated vallecular residue and pyriform sinus residue which was mild to moderate depending on bolus size and consistency. Pt exhibited aspiration (PAS 7) with thin liquids and penetration (PAS 3) and ultimate aspiration (PAS 7, 8) was also noted with nectar thick liquids. Most instances of laryngeal invasion resulted in throat clearing and/or coughing. This was effective in propelling the aspirant superiorly but subsequent aspiration was inconsistently noted. Postural modifications were attempted but no functional benefit was noted. Use of an effortful swallow inconsistently eliminated laryngeal invasion with nectar thick liquids but not with thin liquids. A dysphagia 2 diet with  honey thick liquids is recommended at this time with allowance of ice chips between meals following oral care. SLP SLP Visit Diagnosis Dysphagia, pharyngeal phase (R13.13) Attention and concentration deficit following -- Frontal lobe and executive function deficit following -- Impact on  safety and function Mild aspiration risk;Moderate aspiration risk   CHL IP TREATMENT RECOMMENDATION 12/11/2019 Treatment Recommendations Therapy as outlined in treatment plan below   Prognosis 12/11/2019 Prognosis for Safe Diet Advancement Good Barriers to Reach Goals Time post onset Barriers/Prognosis Comment -- CHL IP DIET RECOMMENDATION 12/11/2019 SLP Diet Recommendations Dysphagia 2 (Fine chop) solids;Honey thick liquids Liquid Administration via Cup;Straw Medication Administration Whole meds with liquid Compensations Slow rate;Small sips/bites;Follow solids with liquid;Clear throat intermittently Postural Changes Seated upright at 90 degrees   CHL IP OTHER RECOMMENDATIONS 12/11/2019 Recommended Consults -- Oral Care Recommendations Oral care BID Other Recommendations Order thickener from pharmacy   CHL IP FOLLOW UP RECOMMENDATIONS 12/11/2019 Follow up Recommendations Home health SLP   CHL IP FREQUENCY AND DURATION 12/11/2019 Speech Therapy Frequency (ACUTE ONLY) min 2x/week Treatment Duration 2 weeks      CHL IP ORAL PHASE 12/11/2019 Oral Phase WFL Oral - Pudding Teaspoon -- Oral - Pudding Cup -- Oral - Honey Teaspoon -- Oral - Honey Cup -- Oral - Nectar Teaspoon -- Oral - Nectar Cup -- Oral - Nectar Straw -- Oral - Thin Teaspoon -- Oral - Thin Cup -- Oral - Thin Straw -- Oral - Puree -- Oral - Mech Soft -- Oral - Regular -- Oral - Multi-Consistency -- Oral - Pill -- Oral Phase - Comment --  CHL IP PHARYNGEAL PHASE 12/11/2019 Pharyngeal Phase Impaired Pharyngeal- Pudding Teaspoon -- Pharyngeal -- Pharyngeal- Pudding Cup -- Pharyngeal -- Pharyngeal- Honey Teaspoon -- Pharyngeal -- Pharyngeal- Honey Cup Reduced anterior laryngeal mobility;Delayed swallow initiation-vallecula;Delayed swallow initiation-pyriform sinuses;Pharyngeal residue - valleculae Pharyngeal Material does not enter airway Pharyngeal- Nectar Teaspoon -- Pharyngeal -- Pharyngeal- Nectar Cup Reduced anterior laryngeal mobility;Delayed swallow  initiation-vallecula;Delayed swallow initiation-pyriform sinuses;Penetration/Aspiration before swallow;Penetration/Aspiration during swallow;Penetration/Apiration after swallow;Trace aspiration;Pharyngeal residue - valleculae Pharyngeal Material enters airway, remains ABOVE vocal cords and not ejected out;Material enters airway, passes BELOW cords and not ejected out despite cough attempt by patient Pharyngeal- Nectar Straw -- Pharyngeal -- Pharyngeal- Thin Teaspoon -- Pharyngeal -- Pharyngeal- Thin Cup Reduced anterior laryngeal mobility;Delayed swallow initiation-vallecula;Delayed swallow initiation-pyriform sinuses;Penetration/Aspiration before swallow;Penetration/Aspiration during swallow;Penetration/Apiration after swallow;Trace aspiration;Pharyngeal residue - valleculae Pharyngeal Material enters airway, passes BELOW cords and not ejected out despite cough attempt by patient Pharyngeal- Thin Straw -- Pharyngeal -- Pharyngeal- Puree Reduced anterior laryngeal mobility;Delayed swallow initiation-vallecula;Delayed swallow initiation-pyriform sinuses;Pharyngeal residue - valleculae Pharyngeal -- Pharyngeal- Mechanical Soft Reduced anterior laryngeal mobility;Delayed swallow initiation-vallecula;Delayed swallow initiation-pyriform sinuses;Pharyngeal residue - valleculae Pharyngeal -- Pharyngeal- Regular Reduced anterior laryngeal mobility;Delayed swallow initiation-vallecula;Delayed swallow initiation-pyriform sinuses;Pharyngeal residue - valleculae Pharyngeal -- Pharyngeal- Multi-consistency -- Pharyngeal -- Pharyngeal- Pill Reduced anterior laryngeal mobility;Delayed swallow initiation-vallecula;Delayed swallow initiation-pyriform sinuses;Pharyngeal residue - valleculae Pharyngeal -- Pharyngeal Comment --  CHL IP CERVICAL ESOPHAGEAL PHASE 12/11/2019 Cervical Esophageal Phase WFL Pudding Teaspoon -- Pudding Cup -- Honey Teaspoon -- Honey Cup -- Nectar Teaspoon -- Nectar Cup -- Nectar Straw -- Thin Teaspoon -- Thin  Cup -- Thin Straw -- Puree -- Mechanical Soft -- Regular -- Multi-consistency -- Pill -- Cervical Esophageal Comment -- Shanika I. Hardin Negus, [place], [place] Office number [phone] Pager [place] 12/11/2019, 2:42 PM              ECHOCARDIOGRAM LIMITED  Result Date: 12/12/2019  ECHOCARDIOGRAM LIMITED REPORT   Patient Name:   KEMYAH BUSER Hinderliter Date of Exam: 12/12/2019 Medical Rec #:  [id number]                Height:       62.0 in Accession #:    [id number]               Weight:       186.3 lb Date of Birth:  [birth year]                 BSA:          1.855 m Patient Age:    38 years                 BP:           137/66 mmHg Patient Gender: F                        HR:           98 bpm. Exam Location:  Inpatient Procedure: Limited Echo, Limited Color Doppler, Cardiac Doppler and Intracardiac            Opacification Agent Indications:    Acute Respiratory Insufficiency 518.82 / R06.89  History:        Patient has prior history of Echocardiogram examinations, most                 recent 11/12/2019. CHF, CAD; Risk Factors:Hypertension, Diabetes                 and Dyslipidemia. Cardiac arrest.  Sonographer:    Darlina Sicilian RDCS Referring Phys: [id number] Candee Furbish  Sonographer Comments: Echo performed with patient supine and on artificial respirator. IMPRESSIONS  1. Left ventricular ejection fraction, by estimation, is 45%. The left ventricle has mildly decreased function. Left ventricular endocardial border not optimally defined to evaluate regional wall motion. Left ventricular diastolic parameters are consistent with Grade I diastolic dysfunction (impaired relaxation).  2. Right ventricular systolic function was not well visualized. The right ventricular size is not well visualized.  3. The mitral valve was not well visualized. No evidence of mitral valve regurgitation. No evidence of mitral stenosis.  4. The aortic valve is tricuspid. Aortic valve  regurgitation is not visualized. Mild aortic valve sclerosis is present, with no evidence of aortic valve stenosis.  5. IVC not well-visualized.  6. Technically difficult study with very poor images. Would repeat when patient is more stable. FINDINGS  Left Ventricle: Left ventricular ejection fraction, by estimation, is 45%. The left ventricle has mildly decreased function. Left ventricular endocardial border not optimally defined to evaluate regional wall motion. Definity contrast agent was given IV  to delineate the left ventricular endocardial borders. The left ventricular internal cavity size was normal in size. There is no left ventricular hypertrophy. Right Ventricle: The right ventricular size is not well visualized. Right ventricular systolic function was not well visualized. Pericardium: There is no evidence of pericardial effusion. Mitral Valve: The mitral valve was not well visualized. Mild mitral annular calcification. No evidence of mitral valve stenosis. Aortic Valve: The aortic valve is tricuspid. Aortic valve regurgitation is not visualized. Mild aortic valve sclerosis is present, with no evidence of aortic valve stenosis. Pulmonic Valve: The pulmonic valve was not well visualized. Pulmonic valve regurgitation is not visualized. Venous: The inferior vena cava was not well visualized.  LV Volumes (MOD) LV vol d, MOD A2C: 79.1  ml Diastology LV vol d, MOD A4C: 69.4 ml LV e' lateral:   4.80 cm/s LV vol s, MOD A2C: 36.5 ml LV E/e' lateral: 16.6 LV vol s, MOD A4C: 44.0 ml LV e' medial:    2.93 cm/s LV SV MOD A2C:     42.6 ml LV E/e' medial:  27.2 LV SV MOD A4C:     69.4 ml LV SV MOD BP:      36.6 ml AORTIC VALVE LVOT Vmax:   89.40 cm/s LVOT Vmean:  53.200 cm/s LVOT VTI:    0.154 m MITRAL VALVE MV Area (PHT): 2.99 cm    SHUNTS MV Decel Time: 254 msec    Systemic VTI: 0.15 m MV E velocity: 79.70 cm/s MV A velocity: 95.50 cm/s MV E/A ratio:  0.83 Loralie Champagne MD Electronically signed by Loralie Champagne MD  Signature Date/Time: 12/12/2019/4:32:45 PM    Final       Medications:     Scheduled Medications: . aspirin  81 mg Per Tube Daily  . chlorhexidine gluconate (MEDLINE KIT)  15 mL Mouth Rinse BID  . Chlorhexidine Gluconate Cloth  6 each Topical Daily  . feeding supplement (PROSource TF)  45 mL Per Tube BID  . insulin aspart  3-9 Units Subcutaneous Q4H  . insulin detemir  12 Units Subcutaneous Q12H  . mouth rinse  15 mL Mouth Rinse 10 times per day  . pantoprazole (PROTONIX) IV  40 mg Intravenous Daily  . rosuvastatin  20 mg Per Tube Daily  . sertraline  50 mg Per Tube Daily  . sodium chloride flush  10 mL Intracatheter Q12H     Infusions: . ceFEPime (MAXIPIME) IV 2 g (12/25/2019 0815)  . dexmedetomidine (PRECEDEX) IV infusion 0.8 mcg/kg/hr (01/10/2020 0429)  . feeding supplement (VITAL AF 1.2 CAL) 1,000 mL (12/12/19 1705)  . fentaNYL infusion INTRAVENOUS 75 mcg/hr (01/05/2020 0429)  . heparin 1,500 Units/hr (12/30/2019 0429)  . norepinephrine (LEVOPHED) Adult infusion 5 mcg/min (01/08/2020 0429)  . propofol (DIPRIVAN) infusion       PRN Medications:  acetaminophen (TYLENOL) oral liquid 160 mg/5 mL, fentaNYL, fentaNYL (SUBLIMAZE) injection, fentaNYL (SUBLIMAZE) injection, ipratropium-albuterol, labetalol, ondansetron (ZOFRAN) IV, Resource ThickenUp Clear   Assessment/Plan :   1. PEA Arrest x 2 - suspect due to hypoxia, ? Pulmonary edema vs aspiration  Had previous PEA arrest in 2018 due to pulmonary edema.  - EF 60% -> 45% - Plan R/L Cath today while intubated  2. Acute Hypoxic Respiratory Failure - 8/24 intubated --> extubated 8/26 -->reintubated 8/26 -->extubated 8/29--> reintubated 12/12/19. CXR concerning for ARDs +/- edema - Respiratory Cx: klebsiella --> on rocephin   - S/P Bronch 8/31with mucus plug possible food   3. Shock - co-ox yesterday 45% - suspect mix of sepsis/cardiogenic - weaning NE - repeat co-ox - R/L cath today   4. A/C Diastolic HF  -ECHO 1/44/3154  EF 55-60% RV normal -> 12/12/19 EF 45% - Volume status improved. Cath today  5. NSTEMI  H/O CAD with S/P PCI Stent to RCA 2002 and distal LAD 2015 - HS Trop [id number] - On statin - Plan for cath as above  6. DMII On SSI    CRITICAL CARE Performed by: Glori Bickers  Total critical care time: 35 minutes  Critical care time was exclusive of separately billable procedures and treating other patients.  Critical care was necessary to treat or prevent imminent or life-threatening deterioration.  Critical care was time spent personally by me (independent of midlevel providers or  residents) on the following activities: development of treatment plan with patient and/or surrogate as well as nursing, discussions with consultants, evaluation of patient's response to treatment, examination of patient, obtaining history from patient or surrogate, ordering and performing treatments and interventions, ordering and review of laboratory studies, ordering and review of radiographic studies, pulse oximetry and re-evaluation of patient's condition.   Length of Stay: 8   Glori Bickers MD 12/22/2019, 9:14 AM  Advanced Heart Failure Team Pager [phone] (M-F; 7a - 4p)  Please contact [facility] for night-coverage after hours (4p -7a ) and weekends on [email or web address]

## 2019-12-13 NOTE — Progress Notes (Signed)
NAME:  [name] Bean, MRN:  [id number], DOB:  1950/07/27, LOS: 8 ADMISSION DATE:  11/21/2019, CONSULTATION DATE:  11/20/2019 REFERRING MD:  Ralene Bathe - EM , CHIEF COMPLAINT:  Respiratory arrest, hypoxemia   Brief History   69yo female presented to ED after PEA cardiac arrest likely due to respiratory failure.  History of present illness   [name] Bean is a 69 y.o. with PHX significant for HTN, HLD, DM, CHF, cardiac arrest, CAD, and anxiety who presented to ED s/p cardiac arrest. Per report patient called EMS with acute complaints of SOB and on arrival EMS found patient unresponsive in PEA arrest. EMS provided 3 rounds of CPR and obtained ROSC prior to ED arrival. No ACLS medications were given. PCCM called urgently to evaluate pt in ED due to hypoxemia.   CXR with bilateral ASD, concerning for possible ARDS. ABG 7.2/52/48 Labs acquired in ED are hemolyzed, resent but unavailable for review   Past Medical History   Diastolic heart failure HTN DM Prior PEA arrest CAD   Significant Hospital Events   8/24 BIB Ems for CC SOB. On EMS arrival, pulseless and hypoxemic. ROSC achieved. PCCM consulted for hypoxemia shortly after intubation. CVC and Aline placed by PCCM in ED 8/25 [facility] w/ much improved aeration. Able to start weaning PEEP/FIO2 down to 10 from 14 and FIO2 down from 100% to 70%. Transitioning OFF insulin gtt. Transitioning from Fent/propofol to precedex.  8/26: Seen by cardiology on the 25th for non-ST elevation MI.  Started on IV heparin and aspirin on the 25th.  Past spontaneous breathing trial on the morning of 8/26 and was extubated.  Did have some fairly significant hypertension post extubation-->progressed to rapid resp failure and subsequent PEA arrest. Re intubated. Time to ROSC 9 minutes.  8/27: More awake.  Weaning PEEP and FiO2.  Continuing to diurese and maximize hemodynamics not ready for extubation Consults:    Procedures:  8/24 ETT>8/26>>> 8/24  CVC> 8/24 Aline>  Significant Diagnostic Tests:  8/24 CXR> Diffuse bilateral pulmonary opacities. ETT 2.5cm above carina.  8/24 CTA chest>>1. No CT evidence of pulmonary embolism.2. Small bilateral pleural effusions with findings of pulmonary edema or ARDS.3. Clusters of airspace consolidation primarily involving the left lower lobe as well as upper lobes may represent pneumonia. Clinical correlation recommended.4. Aortic Atherosclerosis (ICD10-I70.0). 8/24 CT H >> negative  ECHO 8/24: Left Ventricle: Endocardial border definition is poor but systolic  function appears to be grossly normal. Cannot rule out mild septal  hypokinesis. Left ventricular ejection fraction, by estimation, is 55 to 60%. The left ventricle has normal function. The left ventricle has no regional wall motion abnormalities. Definity contrast agent was given IV to delineate the left ventricular endocardial borders. The left ventricular internal cavity size was normal in size. There is no left ventricular hypertrophy. Left ventricular diastolic parameters are indeterminate.  Micro Data:  8/24 SARS Cov2> neg 8/24 RVP >> 8/24 Tracheal aspirate >>klebsiella 8/24 BCx>> 8/28 sputum klebsiella   Antimicrobials:  Unasyn 8/24 >>>8/29 Ceftriaxone 8/29>>  Interim history/subjective:   Much better on vent, awake, alert interactive.   Objective   Blood pressure (!) 118/47, pulse 76, temperature 99.7 F (37.6 C), resp. rate (!) 23, height $RemoveBe'5\' 2"'[id number]$  (1.575 m), weight 86.1 kg, SpO2 99 %. CVP:  [6 mmHg-11 mmHg] 11 mmHg  Vent Mode: PRVC FiO2 (%):  [80 %] 80 % Set Rate:  [20 bmp] 20 bmp Vt Set:  [400 mL] 400 mL PEEP:  [10 cmH20] 10 cmH20 Plateau Pressure:  [  29 cmH20-32 cmH20] 29 cmH20   Intake/Output Summary (Last 24 hours) at 01/07/2020 1107 Last data filed at 01/05/2020 1000 Gross per 24 hour  Intake 2108.04 ml  Output 1350 ml  Net 758.04 ml   Filed Weights   12/10/19 0400 12/11/19 0500 01/08/2020 0500  Weight: 91.5 kg 84.5 kg  86.1 kg    Examination:  GEN: no acute distress on vent HEENT: ETT in place, moderate mucoid secretions CV: RRR, ext warm PULM: rhonci bilaterally, no wheezing GI: Soft, +BS EXT: No edema NEURO: Moves all 4 ext to command PSYCH: more relaxed today on vent SKIN: No rashes  Sodium 148>>149 Cr up  Resolved Hospital Problem list    Assessment & Plan:    Acute diastolic HF w/ Pulmonary edema, with non-ST elevation MI - EF down a bit - Given hx of recurrent flash edema (+/- element of aspiration) resulting in cardiac arrest twice this admission so far, plan to go for left/right heart cath today; then will need to make a decision regarding whether to try another extubation trial or proceed with trach - IV heparin - Continue aspirin, Crestor, beta blocker - Diuresis as tolerated by renal function - May need to discuss heart cath while intubated with cardiology  Hx HTN - Goals SBP < 160, continue coreg and hydralazine for now  Recurrent Acute respiratory failure with hypoxemia in the setting of diffuse pulmonary edema; has had PEA arrest twice from this. Likely recurrent aspiration as well based on MBSS Klebsiella PNA - Abx broadened to cefepime - VAP bundle - Check rheum panel although suspect low yield: ESR/CRP elevated, RF mildly elevated; f/u ANA, CCP, ANCA  Fluid and electrolyte imbalance: Intermittent Mild AKI-  A little worse today - Hold any diuresis today, net neg for admission so far  DM w/ hyperglycemia  - sliding-scale insulin with current Levemir dosing - Blood glucose goal 140-180  Dysphagia- MBSS showed persistent aspiration with thin and nectar thick liquids.  Recommended for honey thickened when able - Re address once off vent  Best practice:  Diet: TF Pain/Anxiety/Delirium protocol (if indicated): precedex/fentanyl VAP protocol (if indicated): on DVT prophylaxis: heparin gtt GI prophylaxis: PPI  Glucose control: SSI + levemir Mobility: BR Code  Status: Full  Family Communication: updated at bedside Disposition: ICU  The patient is critically ill with multiple organ systems failure and requires high complexity decision making for assessment and support, frequent evaluation and titration of therapies, application of advanced monitoring technologies and extensive interpretation of multiple databases. Critical Care Time devoted to patient care services described in this note independent of APP/resident time (if applicable)  is 37 minutes.   Erskine Emery MD [place] [facility] 12/21/2019 11:07 AM Personal pager: [phone] If unanswered, please page CCM On-call: [phone]

## 2019-12-13 NOTE — Progress Notes (Signed)
ANTICOAGULATION CONSULT NOTE  Pharmacy Consult for heparin Indication: chest pain/ACS  Patient Measurements: Height: 5\' 2"  (157.5 cm) Weight: 86.1 kg (189 lb 13.1 oz) IBW/kg (Calculated) : 50.1 Heparin Dosing Weight: 72.4 kg  Vital Signs: Temp: 99.7 F (37.6 C) (09/01 0729) Temp Source: Oral (09/01 0353) BP: 118/47 (09/01 1000) Pulse Rate: 76 (09/01 1000)  Labs: Recent Labs    12/11/19 0353 12/11/19 2104 12/12/19 0520 12/12/19 0520 12/12/19 1147 12/12/19 1237 12/12/19 1352 12/16/2019 0358  HGB 9.0*  --  8.5*   < > 8.5*  --   --  7.9*  HCT 28.0*  --  24.7*  --  25.0*  --   --  25.6*  PLT 183  --  249  --   --   --   --  310  HEPARINUNFRC 0.24*   < > 0.41  --   --   --  0.33 0.55  CREATININE 1.14*  --  1.00  --   --  1.18*  --  1.46*  TROPONINIHS  --   --   --   --   --  196*  --   --    < > = values in this interval not displayed.    Estimated Creatinine Clearance: 38.1 mL/min (A) (by C-G formula based on SCr of 1.46 mg/dL (H)).  Assessment: 69 YO female presenting s/p PEA arrest. Patient called EMS with acute complaints of shortness of breath and was found unresponsive in PEA arrest.  EMS provided 3 rounds of CPR and obtained ROSC prior to ED arrival.  Patient does have a history of PEA arrest in 2018.  No anticoagulation PTA.  Pharmacy was consulted to start heparin to rule out ACS.  Heparin level therapeutic at 0.55 on drip rate 15 units/hr. Hgb down slightly to 7.9, plts wnl. No overt bleeding or infusion issues per discussion with nursing. Planning for left/right heart cath today.  Goal of Therapy:  Heparin level 0.3-0.7 units/ml Monitor platelets by anticoagulation protocol: Yes   Plan:  Continue heparin infusion 1500 units/hr Monitor daily HL, CBC, s/sx bleeding F/u post [facility]  Richardine Service, PharmD PGY2 Cardiology Pharmacy Resident Phone: [phone] 12/31/2019  10:49 AM  Please check [email or web address] for unit-specific pharmacy phone numbers.

## 2019-12-13 NOTE — Progress Notes (Signed)
Inpatient Diabetes Program Recommendations  AACE/ADA: New Consensus Statement on Inpatient Glycemic Control (2015)  Target Ranges:  Prepandial:   less than 140 mg/dL      Peak postprandial:   less than 180 mg/dL (1-2 hours)      Critically ill patients:  140 - 180 mg/dL   Lab Results  Component Value Date   GLUCAP 261 (H) 12/25/2019   HGBA1C 7.9 (H) 12/12/2019    Review of Glycemic Control Results for [name] Bean, [name] Bean (MRN [id number]) as of 01/05/2020 13:28  Ref. Range 12/16/2019 07:27 12/30/2019 11:07  Glucose-Capillary Latest Ref Range: 70 - 99 mg/dL 253 (H) 261 (H)   Diabetes history: Type 2 DM Outpatient Diabetes medications: Novolog 12 units TID, Lantus 30 units QHS Current orders for Inpatient glycemic control: Levemir 12 units BID, Novolog 3-9 units Q4H Vital 1.2 @ 50 ml/hr  Inpatient Diabetes Program Recommendations:    Consider adding tube feed coverage: Novolog 3 units Q4H (following heart cath; to be stopped or held in the event tube feeds are stopped).  Thanks, Bronson Curb, MSN, RNC-OB Diabetes Coordinator [phone] (8a-5p)

## 2019-12-13 NOTE — Progress Notes (Signed)
PT Cancellation Note  Patient Details Name: Vincenza Dail MRN: [id number] DOB: Oct 07, 1950   Cancelled Treatment:    Reason Eval/Treat Not Completed: Patient not medically ready (pt reintubated, sedated with current FiO2 80%)   Zykeria Laguardia B Neeka Urista 01/01/2020, 7:40 AM  Bayard Males, PT Acute Rehabilitation Services Pager: [phone] Office: [phone]

## 2019-12-13 NOTE — Progress Notes (Signed)
OT Cancellation Note  Patient Details Name: Maysen Bonsignore MRN: [id number] DOB: [birth year]   Cancelled Treatment:    Reason Eval/Treat Not Completed: Medical issues which prohibited therapy; pt now reintubated, sedated with current FiO2 80%, will follow up as able.  Lou Cal, OT Acute Rehabilitation Services Pager [phone] Office [phone]    Raymondo Band 12/30/2019, 9:19 AM

## 2019-12-13 DEATH — deceased

## 2019-12-14 ENCOUNTER — Encounter (HOSPITAL_COMMUNITY): Payer: Self-pay | Admitting: Internal Medicine

## 2019-12-14 ENCOUNTER — Inpatient Hospital Stay (HOSPITAL_COMMUNITY): Payer: Medicare PPO

## 2019-12-14 ENCOUNTER — Ambulatory Visit: Payer: Medicare PPO | Admitting: Physical Therapy

## 2019-12-14 LAB — TYPE AND SCREEN
ABO/RH(D): B NEG
Antibody Screen: NEGATIVE
Unit division: 0

## 2019-12-14 LAB — BPAM RBC
Blood Product Expiration Date: 202109072359
ISSUE DATE / TIME: 202109012122
Unit Type and Rh: 9500

## 2019-12-14 LAB — CULTURE, RESPIRATORY W GRAM STAIN

## 2019-12-14 LAB — CBC
HCT: 25 % — ABNORMAL LOW (ref 36.0–46.0)
Hemoglobin: 7.8 g/dL — ABNORMAL LOW (ref 12.0–15.0)
MCH: 31.1 pg (ref 26.0–34.0)
MCHC: 31.2 g/dL (ref 30.0–36.0)
MCV: 99.6 fL (ref 80.0–100.0)
Platelets: 267 10*3/uL (ref 150–400)
RBC: 2.51 MIL/uL — ABNORMAL LOW (ref 3.87–5.11)
RDW: 14.1 % (ref 11.5–15.5)
WBC: 17.3 10*3/uL — ABNORMAL HIGH (ref 4.0–10.5)
nRBC: 0.3 % — ABNORMAL HIGH (ref 0.0–0.2)

## 2019-12-14 LAB — BASIC METABOLIC PANEL
Anion gap: 11 (ref 5–15)
BUN: 39 mg/dL — ABNORMAL HIGH (ref 8–23)
CO2: 24 mmol/L (ref 22–32)
Calcium: 8.3 mg/dL — ABNORMAL LOW (ref 8.9–10.3)
Chloride: 113 mmol/L — ABNORMAL HIGH (ref 98–111)
Creatinine, Ser: 1.38 mg/dL — ABNORMAL HIGH (ref 0.44–1.00)
GFR calc Af Amer: 46 mL/min — ABNORMAL LOW (ref >60)
GFR calc non Af Amer: 39 mL/min — ABNORMAL LOW (ref >60)
Glucose, Bld: 217 mg/dL — ABNORMAL HIGH (ref 70–99)
Potassium: 3.2 mmol/L — ABNORMAL LOW (ref 3.5–5.1)
Sodium: 148 mmol/L — ABNORMAL HIGH (ref 135–145)

## 2019-12-14 LAB — PATHOLOGIST SMEAR REVIEW

## 2019-12-14 LAB — COOXEMETRY PANEL
Carboxyhemoglobin: 2.4 % — ABNORMAL HIGH (ref 0.5–1.5)
Methemoglobin: 0.4 % (ref 0.0–1.5)
O2 Saturation: 65.5 %
Total hemoglobin: 8.1 g/dL — ABNORMAL LOW (ref 12.0–16.0)

## 2019-12-14 LAB — GLUCOSE, CAPILLARY
Glucose-Capillary: 198 mg/dL — ABNORMAL HIGH (ref 70–99)
Glucose-Capillary: 216 mg/dL — ABNORMAL HIGH (ref 70–99)
Glucose-Capillary: 231 mg/dL — ABNORMAL HIGH (ref 70–99)
Glucose-Capillary: 231 mg/dL — ABNORMAL HIGH (ref 70–99)
Glucose-Capillary: 260 mg/dL — ABNORMAL HIGH (ref 70–99)
Glucose-Capillary: 275 mg/dL — ABNORMAL HIGH (ref 70–99)
Glucose-Capillary: 277 mg/dL — ABNORMAL HIGH (ref 70–99)
Glucose-Capillary: 294 mg/dL — ABNORMAL HIGH (ref 70–99)
Glucose-Capillary: 300 mg/dL — ABNORMAL HIGH (ref 70–99)
Glucose-Capillary: 319 mg/dL — ABNORMAL HIGH (ref 70–99)

## 2019-12-14 LAB — MPO/PR-3 (ANCA) ANTIBODIES
ANCA Proteinase 3: 5 U/mL — ABNORMAL HIGH (ref 0.0–3.5)
Myeloperoxidase Abs: <9 U/mL (ref 0.0–9.0)

## 2019-12-14 LAB — PROCALCITONIN: Procalcitonin: 5.52 ng/mL

## 2019-12-14 LAB — TRIGLYCERIDES: Triglycerides: 203 mg/dL — ABNORMAL HIGH (ref <150)

## 2019-12-14 MED ORDER — CALCIUM GLUCONATE-NACL 2-0.675 GM/100ML-% IV SOLN
2.0000 g | Freq: Once | INTRAVENOUS | Status: AC
  Administered 2019-12-14: 2000 mg via INTRAVENOUS
  Filled 2019-12-14: qty 100

## 2019-12-14 MED ORDER — LABETALOL HCL 5 MG/ML IV SOLN: 20.0000 mg | INTRAVENOUS | Status: DC | PRN

## 2019-12-14 MED ORDER — POTASSIUM CHLORIDE 10 MEQ/50ML IV SOLN: 10.0000 meq | INTRAVENOUS | Status: DC

## 2019-12-14 MED ORDER — POTASSIUM CHLORIDE 20 MEQ/15ML (10%) PO SOLN
20.0000 meq | ORAL | Status: AC
  Administered 2019-12-14 (×2): 20 meq
  Filled 2019-12-14 (×2): qty 15

## 2019-12-14 MED ORDER — INSULIN ASPART 100 UNIT/ML ~~LOC~~ SOLN
4.0000 [IU] | SUBCUTANEOUS | Status: DC
  Administered 2019-12-14 (×3): 4 [IU] via SUBCUTANEOUS

## 2019-12-14 MED ORDER — INSULIN REGULAR(HUMAN) IN NACL 100-0.9 UT/100ML-% IV SOLN
INTRAVENOUS | Status: DC
  Administered 2019-12-14: 9.5 [IU]/h via INTRAVENOUS
  Filled 2019-12-14: qty 100

## 2019-12-14 MED ORDER — CLOPIDOGREL BISULFATE 75 MG PO TABS
75.0000 mg | ORAL_TABLET | Freq: Every day | ORAL | Status: DC
  Administered 2019-12-15 – 2019-12-17 (×3): 75 mg
  Filled 2019-12-14 (×3): qty 1

## 2019-12-14 MED ORDER — POTASSIUM CHLORIDE 20 MEQ/15ML (10%) PO SOLN: 20.0000 meq | ORAL | Status: DC

## 2019-12-14 MED ORDER — ENOXAPARIN SODIUM 40 MG/0.4ML ~~LOC~~ SOLN
40.0000 mg | SUBCUTANEOUS | Status: DC
  Administered 2019-12-14 – 2019-12-16 (×3): 40 mg via SUBCUTANEOUS
  Filled 2019-12-14 (×3): qty 0.4

## 2019-12-14 MED ORDER — POTASSIUM CHLORIDE 10 MEQ/50ML IV SOLN
10.0000 meq | INTRAVENOUS | Status: AC
  Administered 2019-12-14 (×4): 10 meq via INTRAVENOUS
  Filled 2019-12-14 (×4): qty 50

## 2019-12-14 MED ORDER — DEXTROSE 50 % IV SOLN: 0.0000 mL | INTRAVENOUS | Status: DC | PRN

## 2019-12-14 MED ORDER — ROSUVASTATIN CALCIUM 20 MG PO TABS
40.0000 mg | ORAL_TABLET | Freq: Every day | ORAL | Status: DC
  Administered 2019-12-14 – 2019-12-17 (×4): 40 mg
  Filled 2019-12-14 (×4): qty 2

## 2019-12-14 NOTE — Progress Notes (Signed)
SLP Cancellation Note  Patient Details Name: [name] Bean MRN: [id number] DOB: Jul 05, 1950   Cancelled treatment:       Reason Eval/Treat Not Completed: Medical issues which prohibited therapy (remains on vent). Will follow.    Osie Bond., M.A. [place] Acute Rehabilitation Services Pager [phone] Office [phone]  12/14/2019, 7:11 AM

## 2019-12-14 NOTE — Progress Notes (Signed)
[place] Progress Note Patient Name: [name] Bean DOB: [birth year] MRN: [id number]   Date of Service  12/14/2019  HPI/Events of Note  Hyperglycemia - Blood glucose = 319 --> 294 --> 300 --> 277. Currently on Levemir + resistant Novolog SSI + Novolog tube feeding coverage.   eICU Interventions  Plan: 1. D/C current blood glucose coverage.  2. Insulin IV infusion per [employer] protocol.      Intervention Category Major Interventions: Hyperglycemia - active titration of insulin therapy  K[place]mere Daywalt [name] 12/14/2019, 8:01 PM

## 2019-12-14 NOTE — Progress Notes (Signed)
[place] Progress Note Patient Name: [name] Bean DOB: [birth year] MRN: [id number]   Date of Service  12/14/2019  HPI/Events of Note  Nursing request to add BMP to AM labs.   eICU Interventions  Will order BMP at 5 AM.      Intervention Category Major Interventions: Other:  Eriyanna Kofoed [name] 12/14/2019, 4:12 AM

## 2019-12-14 NOTE — Progress Notes (Signed)
NAME:  [name] Bean, MRN:  [id number], DOB:  1950/04/27, LOS: 9 ADMISSION DATE:  12/11/2019, CONSULTATION DATE:  11/26/2019 REFERRING MD:  Ralene Bathe - EM , CHIEF COMPLAINT:  Respiratory arrest, hypoxemia   Brief History   69yo female presented to ED after PEA cardiac arrest likely due to respiratory failure.  History of present illness   [name] Bean is a 69 y.o. with PHX significant for HTN, HLD, DM, CHF, cardiac arrest, CAD, and anxiety who presented to ED s/p cardiac arrest. Per report patient called EMS with acute complaints of SOB and on arrival EMS found patient unresponsive in PEA arrest. EMS provided 3 rounds of CPR and obtained ROSC prior to ED arrival. No ACLS medications were given. PCCM called urgently to evaluate pt in ED due to hypoxemia.   CXR with bilateral ASD, concerning for possible ARDS. ABG 7.2/52/48 Labs acquired in ED are hemolyzed, resent but unavailable for review   Past Medical History   Diastolic heart failure HTN DM Prior PEA arrest CAD   Significant Hospital Events   8/24 BIB Ems for CC SOB. On EMS arrival, pulseless and hypoxemic. ROSC achieved. PCCM consulted for hypoxemia shortly after intubation. CVC and Aline placed by PCCM in ED 8/25 [facility] w/ much improved aeration. Able to start weaning PEEP/FIO2 down to 10 from 14 and FIO2 down from 100% to 70%. Transitioning OFF insulin gtt. Transitioning from Fent/propofol to precedex.  8/26: Seen by cardiology on the 25th for non-ST elevation MI.  Started on IV heparin and aspirin on the 25th.  Past spontaneous breathing trial on the morning of 8/26 and was extubated.  Did have some fairly significant hypertension post extubation-->progressed to rapid resp failure and subsequent PEA arrest. Re intubated. Time to ROSC 9 minutes.  8/27: More awake.  Weaning PEEP and FiO2.  Continuing to diurese and maximize hemodynamics not ready for extubation Consults:  CHF  Procedures:  8/24 ETT>8/26>>> 8/24  CVC> 8/24 Aline>  Significant Diagnostic Tests:  8/24 CXR> Diffuse bilateral pulmonary opacities. ETT 2.5cm above carina.  8/24 CTA chest>>1. No CT evidence of pulmonary embolism.2. Small bilateral pleural effusions with findings of pulmonary edema or ARDS.3. Clusters of airspace consolidation primarily involving the left lower lobe as well as upper lobes may represent pneumonia. Clinical correlation recommended.4. Aortic Atherosclerosis (ICD10-I70.0). 8/24 CT H >> negative  ECHO 8/24: Left Ventricle: Endocardial border definition is poor but systolic  function appears to be grossly normal. Cannot rule out mild septal  hypokinesis. Left ventricular ejection fraction, by estimation, is 55 to 60%. The left ventricle has normal function. The left ventricle has no regional wall motion abnormalities. Definity contrast agent was given IV to delineate the left ventricular endocardial borders. The left ventricular internal cavity size was normal in size. There is no left ventricular hypertrophy. Left ventricular diastolic parameters are indeterminate.   9/1 RHC, LHC Findings: Ao = 103/45 (65) LV = 105/12 RA =  8 RV = 36/8 PA = 32/14 (21) PCW = 14 Fick cardiac output/index = 4.7/2.5 PVR = 1.5 WU FA sat = 97%  PA sat = 47%, 49% Assessment: 1. CAD with patent stents in RCA and LAD.  2. 95% stenosis in distal RCA otherwise non-obstructive CAD 3. EF hard to assess ~ 40-45% 4. Normal filling pressures with low MV sat but preserved CO Plan/Discussion: Will plan PCI of distal RCA.    Micro Data:  8/24 SARS Cov2> neg 8/24 RVP >> 8/24 Tracheal aspirate >>klebsiella 8/24 BCx>> 8/28 sputum  klebsiella  8/31 BAL>>  Antimicrobials:  See micro tab   Interim history/subjective:  Remains awake and interactive on vent. Cath results noted. Still febrile.  Objective   Blood pressure (!) 116/43, pulse 72, temperature (!) 100.4 F (38 C), temperature source Oral, resp. rate (!) 25, height _0   (1.575 m), weight 84 kg, SpO2 96 %. CVP:  [11 mmHg-20 mmHg] 16 mmHg  Vent Mode: PRVC FiO2 (%):  [30 %-80 %] 30 % Set Rate:  [20 bmp] 20 bmp Vt Set:  [400 mL] 400 mL PEEP:  [5 cmH20-10 cmH20] 5 cmH20   Intake/Output Summary (Last 24 hours) at 12/14/2019 0818 Last data filed at 12/14/2019 0700 Gross per 24 hour  Intake 2326 ml  Output 2400 ml  Net -74 ml   Filed Weights   12/11/19 0500 01/01/2020 0500 12/14/19 0415  Weight: 84.5 kg 86.1 kg 84 kg    Examination:  GEN: no acute distress on vent HEENT: ETT in place, moderate mucoid secretions CV: RRR, ext warm PULM: rhonci bilaterally, no wheezing GI: Soft, +BS EXT: No edema NEURO: Moves all 4 ext to command PSYCH: RASS 0 SKIN: No rashes  Stable elevated Cr and sodium WBC up Hgb responded to unit of blood  Resolved Hospital Problem list    Assessment & Plan:    Acute diastolic HF w/ Pulmonary edema, with non-ST elevation MI- s/p DES to distal RCA, hopefully this reduces her risk of post-extubation pulmonary edema. - Continue Crestor - Discussion of further diuresis with Dr. Missy Sabins, antiplatelets per his direction  Hx HTN - Goals SBP < 160, with sedation antihypertensives are on hold  Recurrent Acute respiratory failure with hypoxemia in the setting of diffuse pulmonary edema; has had PEA arrest twice from this. Likely recurrent aspiration as well based on MBSS Klebsiella PNA- treated Question recurrent HCAP although probably just aspiration - On cefepime - VAP bundle - Check rheum panel although suspect low yield: ESR/CRP elevated, RF mildly elevated; f/u ANA, CCP, ANCA  Fluid and electrolyte imbalance: Intermittent Mild AKI-  Stable - Continue FWF  DM w/ hyperglycemia  - sliding-scale insulin with current Levemir dosing, add TD coverage - Blood glucose goal 140-180  Dysphagia- MBSS showed persistent aspiration with thin and nectar thick liquids.  Recommended for honey thickened when able - Re address once off  vent  GOC- discussed risks benefits of another trial of extubation with patient.  I am hopeful with the stenting she can make it off vent.  She has had two cardiac arrests from these respiratory issues which makes risks of another extubation trial pretty high.  She does not tolerate BIPAP very well.  I think if she fails again she needs a tracheostomy.  She wants a day to discuss with family.  I think tomorrow we will give another extubation shot and use cleviprex drip to prevent any large BP spikes.  Best practice:  Diet: TF Pain/Anxiety/Delirium protocol (if indicated): propofol/fentanyl VAP protocol (if indicated): on DVT prophylaxis: lovenox GI prophylaxis: PPI  Glucose control: see above Mobility: BR Code Status: Full  Family Communication: updated at bedside Disposition: ICU  The patient is critically ill with multiple organ systems failure and requires high complexity decision making for assessment and support, frequent evaluation and titration of therapies, application of advanced monitoring technologies and extensive interpretation of multiple databases. Critical Care Time devoted to patient care services described in this note independent of APP/resident time (if applicable)  is 36 minutes.   Erskine Emery MD [place]  [facility] 12/14/2019 8:18 AM Personal pager: [phone] If unanswered, please page CCM On-call: [phone]

## 2019-12-14 NOTE — Progress Notes (Signed)
Chaplain followed up with [name] Bean, [name] Bean's sister, regarding Advanced Directive.  At time of visit, [name] Bean was sleeping.  Chaplain let [name] Bean know to communicate with nurse when [name] Bean is awake and alert.  [name] Bean stated that she has been trying to have paperwork notarized for days.  Chaplain will keep consult up to let incoming chaplains know of need for a notary to complete paperwork.

## 2019-12-14 NOTE — Progress Notes (Addendum)
Physical Therapy Treatment Patient Details Name: [name] Bean MRN: [id number] DOB: May 24, 1950 Today's Date: 12/14/2019    History of Present Illness 69 y/o woman with acute hypoxic respiratory failure leading to PEA arrest with EMS 8/24.  Post extubation on 8/26 she had severe acute decompensation leading to another bradycardic PEA arrest and reintubated.  Presumed arrest due to flash pulmonary edema that progressed quickly. New left BBB. Extubated 8/29. Reintubated 8/31 with bronch and mucous plug. PMHx: CAD, HTN    PT Comments    Pt awake, alert, mouthing words and writing with sister present throughout session. Pt able to stand from foot egress with assist but limited by increased RR after standing to 39 with max cues for calming, decreased RR and breathing. Pt also report soreness from sitting with assist to reposition and ease discomfort with pt in full sitting. Further HEP and transfers deferred due to RR. Will continue to follow. RN suctioned during session. Pt also stating she wants to get trach now.   162/127 after standing HR 130-140 with mobility SpO2 88-92% with standing PRVC Fio2 30%    Follow Up Recommendations  Supervision/Assistance - 24 hour;Home health PT;CIR (pending medical stability)     Equipment Recommendations  Rolling walker with 5" wheels    Recommendations for Other Services       Precautions / Restrictions Precautions Precautions: Fall;Other (comment) Precaution Comments: ETT 23 cm, vent, cortrak, flexiseal    Mobility  Bed Mobility Overal bed mobility: Needs Assistance Bed Mobility: Rolling Rolling: Min assist         General bed mobility comments: rolling to place pillow under hip. Mod +2 assist to slide fully up to [facility]. Use of foot egress function to transition from supine <>sitting  Transfers Overall transfer level: Needs assistance   Transfers: Sit to/from Stand Sit to Stand: Min assist;+2 safety/equipment         General  transfer comment: bil UE support to rise from foot egress position with pt able to fully stand, cues for trunk extension and decreased RR with pt able to stand grossly 15 sec prior to sitting. Pt with increased WOB after standing, returned to sitting and further mobility beyond positioning deferred  Ambulation/Gait             General Gait Details: not yet able   Stairs             Wheelchair Mobility    Modified Rankin (Stroke Patients Only)       Balance Overall balance assessment: Needs assistance Sitting-balance support: Single extremity supported;Feet supported Sitting balance-Leahy Scale: Good     Standing balance support: Bilateral upper extremity supported Standing balance-Leahy Scale: Poor Standing balance comment: dependent on external support                            Cognition Arousal/Alertness: Awake/alert Behavior During Therapy: WFL for tasks assessed/performed Overall Cognitive Status: Within Functional Limits for tasks assessed                                 General Comments: pt following all commands and communicating appropriately despite ETT      Exercises      General Comments        Pertinent Vitals/Pain Pain Assessment: 0-10 Pain Score: 5  Pain Location: hips and sacrum Pain Descriptors / Indicators: Aching;Tender;Sore Pain Intervention(s): Limited activity within  patient's tolerance;Monitored during session;Repositioned    Home Living                      Prior Function            PT Goals (current goals can now be found in the care plan section) Progress towards PT goals: Progressing toward goals    Frequency    Min 3X/week      PT Plan Discharge plan needs to be updated    Co-evaluation              AM-PAC PT "6 Clicks" Mobility   Outcome Measure  Help needed turning from your back to your side while in a flat bed without using bedrails?: A Little Help needed  moving from lying on your back to sitting on the side of a flat bed without using bedrails?: A Lot Help needed moving to and from a bed to a chair (including a wheelchair)?: Total Help needed standing up from a chair using your arms (e.g., wheelchair or bedside chair)?: A Lot Help needed to walk in hospital room?: Total Help needed climbing 3-5 steps with a railing? : Total 6 Click Score: 10    End of Session   Activity Tolerance: Patient tolerated treatment well Patient left: in bed;with call bell/phone within reach;with family/visitor present;with nursing/sitter in room Nurse Communication: Mobility status PT Visit Diagnosis: Other abnormalities of gait and mobility (R26.89);Difficulty in walking, not elsewhere classified (R26.2)     Time: 7619-5093 PT Time Calculation (min) (ACUTE ONLY): 30 min  Charges:  $Therapeutic Activity: 23-37 mins                     Kinley Dozier P, PT Acute Rehabilitation Services Pager: [phone] Office: [place] 12/14/2019, 1:57 PM

## 2019-12-14 NOTE — Progress Notes (Addendum)
Advanced Heart Failure Rounding Note   Subjective:    Remains on the vent. Denies pain.   On Norepi 1 mcg.   EF 45% on echo   Prox RCA to Mid RCA lesion is 50% stenosed.  Previously placed Mid RCA to Dist RCA stent (unknown type) is widely patent.  Dist RCA lesion is 95% stenosed. S/P DES to Dist RCA. RA 8 PCW 14  CO 4.7 CI 2.5   S/P 1UPRBCs.   Objective:   Weight Range:  Vital Signs:   Temp:  [99.6 F (37.6 C)-101.2 F (38.4 C)] 99.9 F (37.7 C) (09/02 0400) Pulse Rate:  [0-84] 72 (09/02 0700) Resp:  [0-35] 25 (09/02 0700) BP: (105-134)/(38-121) 116/43 (09/02 0700) SpO2:  [91 %-100 %] 96 % (09/02 0700) FiO2 (%):  [30 %-80 %] 30 % (09/02 0346) Weight:  [84 kg] 84 kg (09/02 0415) Last BM Date: 12/12/19  Weight change: Filed Weights   12/11/19 0500 01/09/2020 0500 12/14/19 0415  Weight: 84.5 kg 86.1 kg 84 kg    Intake/Output:   Intake/Output Summary (Last 24 hours) at 12/14/2019 0714 Last data filed at 12/14/2019 0700 Gross per 24 hour  Intake 2521.46 ml  Output 2515 ml  Net 6.46 ml    CVP 7-8  Physical Exam: General:  On vent  HEENT: ETT  Neck: supple. Difficult to assess.  Carotids 2+ bilat; no bruits. No lymphadenopathy or thryomegaly appreciated. Cor: PMI nondisplaced. Regular rate & rhythm. No rubs, gallops or murmurs. Lungs: clear Abdomen: soft, nontender, nondistended. No hepatosplenomegaly. No bruits or masses. Good bowel sounds. Extremities: no cyanosis, clubbing, rash, edema Neuro: Awake on vent. Follows commands. MAE x4.   Telemetry: NSR 70s    Labs: Basic Metabolic Panel: Recent Labs  Lab 12/07/19 1706 12/08/19 0324 12/11/19 0353 12/11/19 0353 12/12/19 0520 12/12/19 1147 12/12/19 1237 12/12/19 1237 12/12/19 2154 12/17/2019 0358 12/16/2019 0358 01/10/2020 1442 01/06/2020 1446 12/15/2019 1447 12/16/2019 1452 12/17/2019 1644 12/14/19 0416  NA  --    < > 147*   < > 150*   < > 148*   < >  --  149*   < > 153* 152* 153* 154*  --  148*  K   --    < > 3.0*   < > 3.2*   < > 4.2   < >  --  3.5   < > 3.4* 3.5 2.9* 3.0*  --  3.2*  CL  --    < > 107   < > 110  --  108  --   --  112*  --  116*  --   --   --   --  113*  CO2  --    < > 26  --  28  --  26  --   --  25  --   --   --   --   --   --  24  GLUCOSE  --    < > 141*   < > 108*  --  248*  --   --  171*  --  172*  --   --   --   --  217*  BUN  --    < > 28*   < > 23  --  26*  --   --  40*  --  41*  --   --   --   --  39*  CREATININE  --    < >  1.14*   < > 1.00  --  1.18*  --   --  1.46*  --  1.20*  --   --   --  1.44* 1.38*  CALCIUM  --    < > 8.7*   < > 8.9  --  8.6*  --   --  8.4*  --   --   --   --   --   --  8.3*  MG 1.8   < > 2.3  --   --   --  2.0  --  2.0 2.4  --   --   --   --   --  2.2  --   PHOS 4.4  --   --   --   --   --  3.6  --  2.4* 3.7  --   --   --   --   --  4.1  --    < > = values in this interval not displayed.    Liver Function Tests: Recent Labs  Lab 12/07/19 0928  AST 275*  ALT 214*  ALKPHOS 121  BILITOT 0.8  PROT 6.2*  ALBUMIN 2.7*   No results for input(s): LIPASE, AMYLASE in the last 168 hours. No results for input(s): AMMONIA in the last 168 hours.  CBC: Recent Labs  Lab 12/07/19 0928 12/07/19 1138 12/12/19 0520 12/12/19 1147 01/04/2020 0358 12/14/2019 1442 01/08/2020 1447 12/26/2019 1452 12/15/2019 1644 12/26/2019 1819 12/14/19 0416  WBC 13.1*   < > 12.3*  --  17.5*  --   --   --  14.3* 15.2* 17.3*  NEUTROABS 9.0*  --   --   --   --   --   --   --   --   --   --   HGB 12.8   < > 8.5*   < > 7.9*   < > 6.5* 5.8* 6.8* 7.1* 7.8*  HCT 39.1   < > 24.7*   < > 25.6*   < > 19.0* 17.0* 22.7* 23.5* 25.0*  MCV 96.5   < > 100.0  --  101.2*  --   --   --  100.9* 100.9* 99.6  PLT PLATELET CLUMPS NOTED ON SMEAR, UNABLE TO ESTIMATE   < > 249  --  310  --   --   --  283 297 267   < > = values in this interval not displayed.    Cardiac Enzymes: No results for input(s): CKTOTAL, CKMB, CKMBINDEX, TROPONINI in the last 168 hours.  BNP: BNP (last 3  results) Recent Labs    12/08/19 0324 12/09/19 0521 12/10/19 0450  BNP 228.5* 136.4* 611.7*    ProBNP (last 3 results) No results for input(s): PROBNP in the last 8760 hours.    Other results:  Imaging: CARDIAC CATHETERIZATION  Result Date: 01/10/2020  Prox RCA to Mid RCA lesion is 50% stenosed.  Previously placed Mid RCA to Dist RCA stent (unknown type) is widely patent.  Dist RCA lesion is 95% stenosed.  Post intervention, there is a 0% residual stenosis.  A drug-eluting stent was successfully placed using a STENT RESOLUTE ONYX 2.0X15.  Successful angioplasty and drug-eluting stent placement to distal right coronary artery. Recommendations: Dual antiplatelet therapy for at least 12 months. Continue vent management and supportive care. Aggressive treatment of cardiovascular risk factors.   CARDIAC CATHETERIZATION  Result Date: 01/01/2020  Prox RCA to Mid RCA lesion is 50% stenosed.  Previously placed Mid  RCA to Dist RCA stent (unknown type) is widely patent.  Dist RCA lesion is 95% stenosed.  Prox Cx to Mid Cx lesion is 40% stenosed.  Mid LAD lesion is 40% stenosed.  Previously placed Dist LAD stent (unknown type) is widely patent.  Findings: Ao = 103/45 (65) LV = 105/12 RA =  8 RV = 36/8 PA = 32/14 (21) PCW = 14 Fick cardiac output/index = 4.7/2.5 PVR = 1.5 WU FA sat = 97% PA sat = 47%, 49% Assessment: 1. CAD with patent stents in RCA and LAD. 2. 95% stenosis in distal RCA otherwise non-obstructive CAD 3. EF hard to assess ~ 40-45% 4. Normal filling pressures with low MV sat but preserved CO Plan/Discussion: Will plan PCI of distal RCA. Glori Bickers, MD 3:11 PM   Portable Chest xray  Result Date: 12/17/2019 CLINICAL DATA:  Status post cardiac arrest. EXAM: PORTABLE CHEST 1 VIEW COMPARISON:  December 12, 2019. FINDINGS: The heart size and mediastinal contours are within normal limits. Endotracheal and nasogastric tubes are unchanged in position. Left internal jugular catheter is  unchanged in position. Stable bilateral lung opacities are noted concerning for multifocal pneumonia or edema. No pneumothorax is noted. Small left pleural effusion may be present. The visualized skeletal structures are unremarkable. IMPRESSION: Stable support apparatus. Stable bilateral lung opacities are noted concerning for multifocal pneumonia or edema. Electronically Signed   By: Marijo Conception M.D.   On: 12/28/2019 08:49   Portable Chest x-ray  Result Date: 12/12/2019 CLINICAL DATA:  Intubated, enteric tube placement after cardiac arrest EXAM: PORTABLE CHEST 1 VIEW COMPARISON:  Chest radiograph from one day prior. FINDINGS: Endotracheal tube tip is in the proximal right mainstem bronchus. Enteric tube enters stomach with the tip not seen on this image. Left internal jugular central venous catheter terminates in the lower third of the SVC. Stable cardiomediastinal silhouette with normal heart size. No pneumothorax. Possible trace bilateral pleural effusions. Extensive patchy opacities throughout both lungs, most prominent in left greater than right parahilar lungs, worsened. IMPRESSION: 1. Endotracheal tube tip in the proximal right mainstem bronchus. 2. Extensive patchy opacities throughout both lungs, most prominent in the left greater than right parahilar lungs, worsened, compatible with any combination of worsening pulmonary edema, aspiration or atelectasis. Critical Value/emergent results were called by telephone at the time of interpretation on 12/12/2019 at 10:24 am to provider [facility] , who verbally acknowledged these results. Electronically Signed   By: Ilona Sorrel M.D.   On: 12/12/2019 10:28   ECHOCARDIOGRAM LIMITED  Result Date: 12/12/2019    ECHOCARDIOGRAM LIMITED REPORT   Patient Name:   MALCOLM HETZ Streicher Date of Exam: 12/12/2019 Medical Rec #:  [id number]                Height:       62.0 in Accession #:    [id number]               Weight:       186.3 lb Date of Birth:  [birth year]                  BSA:          1.855 m Patient Age:    69 years                 BP:           137/66 mmHg Patient Gender: F  HR:           98 bpm. Exam Location:  Inpatient Procedure: Limited Echo, Limited Color Doppler, Cardiac Doppler and Intracardiac            Opacification Agent Indications:    Acute Respiratory Insufficiency 518.82 / R06.89  History:        Patient has prior history of Echocardiogram examinations, most                 recent 11/30/2019. CHF, CAD; Risk Factors:Hypertension, Diabetes                 and Dyslipidemia. Cardiac arrest.  Sonographer:    Darlina Sicilian RDCS Referring Phys: [id number] Candee Furbish  Sonographer Comments: Echo performed with patient supine and on artificial respirator. IMPRESSIONS  1. Left ventricular ejection fraction, by estimation, is 45%. The left ventricle has mildly decreased function. Left ventricular endocardial border not optimally defined to evaluate regional wall motion. Left ventricular diastolic parameters are consistent with Grade I diastolic dysfunction (impaired relaxation).  2. Right ventricular systolic function was not well visualized. The right ventricular size is not well visualized.  3. The mitral valve was not well visualized. No evidence of mitral valve regurgitation. No evidence of mitral stenosis.  4. The aortic valve is tricuspid. Aortic valve regurgitation is not visualized. Mild aortic valve sclerosis is present, with no evidence of aortic valve stenosis.  5. IVC not well-visualized.  6. Technically difficult study with very poor images. Would repeat when patient is more stable. FINDINGS  Left Ventricle: Left ventricular ejection fraction, by estimation, is 45%. The left ventricle has mildly decreased function. Left ventricular endocardial border not optimally defined to evaluate regional wall motion. Definity contrast agent was given IV  to delineate the left ventricular endocardial borders. The left ventricular internal  cavity size was normal in size. There is no left ventricular hypertrophy. Right Ventricle: The right ventricular size is not well visualized. Right ventricular systolic function was not well visualized. Pericardium: There is no evidence of pericardial effusion. Mitral Valve: The mitral valve was not well visualized. Mild mitral annular calcification. No evidence of mitral valve stenosis. Aortic Valve: The aortic valve is tricuspid. Aortic valve regurgitation is not visualized. Mild aortic valve sclerosis is present, with no evidence of aortic valve stenosis. Pulmonic Valve: The pulmonic valve was not well visualized. Pulmonic valve regurgitation is not visualized. Venous: The inferior vena cava was not well visualized.  LV Volumes (MOD) LV vol d, MOD A2C: 79.1 ml Diastology LV vol d, MOD A4C: 69.4 ml LV e' lateral:   4.80 cm/s LV vol s, MOD A2C: 36.5 ml LV E/e' lateral: 16.6 LV vol s, MOD A4C: 44.0 ml LV e' medial:    2.93 cm/s LV SV MOD A2C:     42.6 ml LV E/e' medial:  27.2 LV SV MOD A4C:     69.4 ml LV SV MOD BP:      36.6 ml AORTIC VALVE LVOT Vmax:   89.40 cm/s LVOT Vmean:  53.200 cm/s LVOT VTI:    0.154 m MITRAL VALVE MV Area (PHT): 2.99 cm    SHUNTS MV Decel Time: 254 msec    Systemic VTI: 0.15 m MV E velocity: 79.70 cm/s MV A velocity: 95.50 cm/s MV E/A ratio:  0.83 Loralie Champagne MD Electronically signed by Loralie Champagne MD Signature Date/Time: 12/12/2019/4:32:45 PM    Final      Medications:     Scheduled Medications: . aspirin  81  mg Per Tube Daily  . chlorhexidine gluconate (MEDLINE KIT)  15 mL Mouth Rinse BID  . Chlorhexidine Gluconate Cloth  6 each Topical Daily  . clopidogrel  75 mg Oral Q breakfast  . feeding supplement (PROSource TF)  45 mL Per Tube BID  . free water  200 mL Per Tube Q4H  . insulin aspart  3-9 Units Subcutaneous Q4H  . insulin detemir  12 Units Subcutaneous Q12H  . mouth rinse  15 mL Mouth Rinse 10 times per day  . pantoprazole (PROTONIX) IV  40 mg Intravenous Daily    . potassium chloride  20 mEq Per Tube Q4H  . rosuvastatin  20 mg Per Tube Daily  . sertraline  50 mg Per Tube Daily  . sodium chloride flush  10 mL Intracatheter Q12H  . sodium chloride flush  3 mL Intravenous Q12H    Infusions: . sodium chloride    . ceFEPime (MAXIPIME) IV Stopped (12/22/2019 2225)  . dexmedetomidine (PRECEDEX) IV infusion 0.6 mcg/kg/hr (12/14/19 0700)  . feeding supplement (VITAL AF 1.2 CAL) 1,000 mL (12/14/19 0433)  . fentaNYL infusion INTRAVENOUS 100 mcg/hr (12/14/19 0700)  . norepinephrine (LEVOPHED) Adult infusion 1 mcg/min (12/14/19 0700)  . potassium chloride 50 mL/hr at 12/14/19 0700  . propofol (DIPRIVAN) infusion      PRN Medications: sodium chloride, acetaminophen (TYLENOL) oral liquid 160 mg/5 mL, fentaNYL, fentaNYL (SUBLIMAZE) injection, fentaNYL (SUBLIMAZE) injection, ipratropium-albuterol, labetalol, ondansetron (ZOFRAN) IV, Resource ThickenUp Clear, sodium chloride flush   Assessment/Plan :   1. PEA Arrest x 2 - suspect due to hypoxia, ? Pulmonary edema vs aspiration  Had previous PEA arrest in 2018 due to pulmonary edema.  - EF 60% -> 45% - DES to RCA   2. Acute Hypoxic Respiratory Failure - 8/24 intubated --> extubated 8/26 -->reintubated 8/26 -->extubated 8/29--> reintubated 12/12/19. CXR concerning for ARDs +/- edema - Respiratory Cx: klebsiella --> switched from rocephin  to cefepime.  - S/P Bronch 8/31with mucus plug possible food  - Will need speech consult once extubated.   3. Shock - Check CO-OX  - suspect mix of sepsis/cardiogenic - Should able to stop norepi.   4. A/C Diastolic HF  -ECHO 11/24/4816 EF 55-60% RV normal -> 12/12/19 EF 45% - Volume status stable. At home she takes torsemide as needed.  - Supp K.    5. NSTEMI  H/O CAD with S/P PCI Stent to RCA 2002 and distal LAD 2015 - HS Trop 563>1497 - On statin  - S/P LHC with Prox RCA to Mid RCA lesion is 50% stenosed.  Previously placed Mid RCA to Dist RCA stent  (unknown type) is widely patent.  Dist RCA lesion is 95% stenosed.  S/P DES to Distal RCA -Dual antiplatelet therapy for at least 12 months. ASA + Plavix - Increase crestor to 40 mg daily. Check lipids 6 weeks.   6. DMII On SSI   7. Anemia  On 8/26 hgb 11.2.  Hgb after cath 6.1 ---> received 1UPRBCs --> 7.8 today - Check CBC in am.  - No obvious source.  - Continue SCDs.   Length of Stay: [place] NP-C  12/14/2019, 7:14 AM  Advanced Heart Failure Team Pager [phone] (M-F; 7a - 4p)  Please contact [facility] for night-coverage after hours (4p -7a ) and weekends on [email or web address]  Agree with above. Remains intubated. Awake on vent. Underwent PCI of small distal RCA yesterday. Post-cath HGB down slightly. Given 1u RBC. No obvious bleeding. CXR with bilateral  airspace disease/ARDS in setting of aspiration. PCT 5.5 Co-ox 65%  General:  Awake on vent HEENT: normal + ETT Neck: supple. JVP 7. Carotids 2+ bilat; no bruits. No lymphadenopathy or thryomegaly appreciated. Cor: PMI nondisplaced. Regular rate & rhythm. No rubs, gallops or murmurs. Lungs: + crackles Abdomen: soft, nontender, nondistended. No hepatosplenomegaly. No bruits or masses. Good bowel sounds. Extremities: no cyanosis, clubbing, rash, edema Neuro: awake on vent.   S/p PCI of small distal RCA yesterday. Remains on vent. CXR with diffuse infiltrates. Not ready for extubation yet. Hopefully when time for extubation PCI with help limit ischemic burden and prevent recurrent respiratory failure. Suspect recurrent aspiration is a large pqrt of her presentation as well.   CRITICAL CARE Performed by: Glori Bickers  Total critical care time: 35 minutes  Critical care time was exclusive of separately billable procedures and treating other patients.  Critical care was necessary to treat or prevent imminent or life-threatening deterioration.  Critical care was time spent personally by me (independent of midlevel  providers or residents) on the following activities: development of treatment plan with patient and/or surrogate as well as nursing, discussions with consultants, evaluation of patient's response to treatment, examination of patient, obtaining history from patient or surrogate, ordering and performing treatments and interventions, ordering and review of laboratory studies, ordering and review of radiographic studies, pulse oximetry and re-evaluation of patient's condition.  Glori Bickers, MD  11:00 PM

## 2019-12-14 NOTE — Progress Notes (Signed)
[facility] ADULT ICU REPLACEMENT PROTOCOL   The patient does apply for the [facility] Adult ICU Electrolyte Replacment Protocol based on the criteria listed below:   1. Is GFR >/= 30 ml/min? Yes.    Patient's GFR today is 46 2. Is SCr </= 2? Yes.   Patient's SCr is 1.38 ml/kg/hr 3. Did SCr increase >/= 0.5 in 24 hours? No. 4. Abnormal electrolyte(s): K-3.2 5. Ordered repletion with: per protocol 6. If a panic level lab has been reported, has the CCM MD in charge been notified? No.   Physician:  Dr Terrill Mohr, Philis [place] 12/14/2019 6:22 AM

## 2019-12-15 ENCOUNTER — Inpatient Hospital Stay (HOSPITAL_COMMUNITY): Payer: Medicare PPO

## 2019-12-15 LAB — POCT I-STAT 7, (LYTES, BLD GAS, ICA,H+H)
Acid-base deficit: 6 mmol/L — ABNORMAL HIGH (ref 0.0–2.0)
Bicarbonate: 20.9 mmol/L (ref 20.0–28.0)
Calcium, Ion: 1.23 mmol/L (ref 1.15–1.40)
HCT: 26 % — ABNORMAL LOW (ref 36.0–46.0)
Hemoglobin: 8.8 g/dL — ABNORMAL LOW (ref 12.0–15.0)
O2 Saturation: 81 %
Patient temperature: 98.8
Potassium: 3.4 mmol/L — ABNORMAL LOW (ref 3.5–5.1)
Sodium: 150 mmol/L — ABNORMAL HIGH (ref 135–145)
TCO2: 22 mmol/L (ref 22–32)
pCO2 arterial: 45 mmHg (ref 32.0–48.0)
pH, Arterial: 7.275 — ABNORMAL LOW (ref 7.350–7.450)
pO2, Arterial: 52 mmHg — ABNORMAL LOW (ref 83.0–108.0)

## 2019-12-15 LAB — COOXEMETRY PANEL
Carboxyhemoglobin: 1.5 % (ref 0.5–1.5)
Methemoglobin: 0.9 % (ref 0.0–1.5)
O2 Saturation: 54.6 %
Total hemoglobin: 8.3 g/dL — ABNORMAL LOW (ref 12.0–16.0)

## 2019-12-15 LAB — GLUCOSE, CAPILLARY
Glucose-Capillary: 118 mg/dL — ABNORMAL HIGH (ref 70–99)
Glucose-Capillary: 124 mg/dL — ABNORMAL HIGH (ref 70–99)
Glucose-Capillary: 136 mg/dL — ABNORMAL HIGH (ref 70–99)
Glucose-Capillary: 160 mg/dL — ABNORMAL HIGH (ref 70–99)
Glucose-Capillary: 160 mg/dL — ABNORMAL HIGH (ref 70–99)
Glucose-Capillary: 160 mg/dL — ABNORMAL HIGH (ref 70–99)
Glucose-Capillary: 169 mg/dL — ABNORMAL HIGH (ref 70–99)
Glucose-Capillary: 185 mg/dL — ABNORMAL HIGH (ref 70–99)
Glucose-Capillary: 190 mg/dL — ABNORMAL HIGH (ref 70–99)
Glucose-Capillary: 226 mg/dL — ABNORMAL HIGH (ref 70–99)
Glucose-Capillary: 307 mg/dL — ABNORMAL HIGH (ref 70–99)
Glucose-Capillary: 316 mg/dL — ABNORMAL HIGH (ref 70–99)
Glucose-Capillary: 326 mg/dL — ABNORMAL HIGH (ref 70–99)

## 2019-12-15 LAB — CBC
HCT: 25.9 % — ABNORMAL LOW (ref 36.0–46.0)
Hemoglobin: 8.1 g/dL — ABNORMAL LOW (ref 12.0–15.0)
MCH: 31.9 pg (ref 26.0–34.0)
MCHC: 31.3 g/dL (ref 30.0–36.0)
MCV: 102 fL — ABNORMAL HIGH (ref 80.0–100.0)
Platelets: 309 10*3/uL (ref 150–400)
RBC: 2.54 MIL/uL — ABNORMAL LOW (ref 3.87–5.11)
RDW: 14.4 % (ref 11.5–15.5)
WBC: 22.4 10*3/uL — ABNORMAL HIGH (ref 4.0–10.5)
nRBC: 0.1 % (ref 0.0–0.2)

## 2019-12-15 LAB — ANA W/REFLEX IF POSITIVE: Anti Nuclear Antibody (ANA): NEGATIVE

## 2019-12-15 LAB — BASIC METABOLIC PANEL
Anion gap: 8 (ref 5–15)
BUN: 43 mg/dL — ABNORMAL HIGH (ref 8–23)
CO2: 23 mmol/L (ref 22–32)
Calcium: 8.5 mg/dL — ABNORMAL LOW (ref 8.9–10.3)
Chloride: 117 mmol/L — ABNORMAL HIGH (ref 98–111)
Creatinine, Ser: 1.31 mg/dL — ABNORMAL HIGH (ref 0.44–1.00)
GFR calc Af Amer: 49 mL/min — ABNORMAL LOW (ref >60)
GFR calc non Af Amer: 42 mL/min — ABNORMAL LOW (ref >60)
Glucose, Bld: 129 mg/dL — ABNORMAL HIGH (ref 70–99)
Potassium: 3.8 mmol/L (ref 3.5–5.1)
Sodium: 148 mmol/L — ABNORMAL HIGH (ref 135–145)

## 2019-12-15 LAB — TRIGLYCERIDES: Triglycerides: 174 mg/dL — ABNORMAL HIGH (ref <150)

## 2019-12-15 LAB — CYCLIC CITRUL PEPTIDE ANTIBODY, IGG/IGA: CCP Antibodies IgG/IgA: 9 units (ref 0–19)

## 2019-12-15 MED ORDER — INSULIN DETEMIR 100 UNIT/ML ~~LOC~~ SOLN
10.0000 [IU] | Freq: Two times a day (BID) | SUBCUTANEOUS | Status: DC
  Administered 2019-12-15 (×2): 10 [IU] via SUBCUTANEOUS
  Filled 2019-12-15 (×3): qty 0.1

## 2019-12-15 MED ORDER — INSULIN ASPART 100 UNIT/ML ~~LOC~~ SOLN
3.0000 [IU] | SUBCUTANEOUS | Status: DC
  Administered 2019-12-15 (×3): 3 [IU] via SUBCUTANEOUS

## 2019-12-15 MED ORDER — POTASSIUM CHLORIDE 20 MEQ PO PACK
40.0000 meq | PACK | Freq: Two times a day (BID) | ORAL | Status: DC
  Administered 2019-12-15: 40 meq via ORAL
  Filled 2019-12-15: qty 2

## 2019-12-15 MED ORDER — INSULIN REGULAR(HUMAN) IN NACL 100-0.9 UT/100ML-% IV SOLN
INTRAVENOUS | Status: DC
  Administered 2019-12-16: 13 [IU]/h via INTRAVENOUS
  Administered 2019-12-17: 2 [IU]/h via INTRAVENOUS
  Filled 2019-12-15 (×3): qty 100

## 2019-12-15 MED ORDER — POTASSIUM CHLORIDE 20 MEQ PO PACK
40.0000 meq | PACK | Freq: Once | ORAL | Status: AC
  Administered 2019-12-15: 40 meq
  Filled 2019-12-15: qty 2

## 2019-12-15 MED ORDER — FUROSEMIDE 10 MG/ML IJ SOLN
80.0000 mg | Freq: Two times a day (BID) | INTRAMUSCULAR | Status: DC
  Administered 2019-12-15 – 2019-12-16 (×4): 80 mg via INTRAVENOUS
  Filled 2019-12-15 (×3): qty 8

## 2019-12-15 MED ORDER — FUROSEMIDE 10 MG/ML IJ SOLN
80.0000 mg | Freq: Once | INTRAMUSCULAR | Status: DC
  Filled 2019-12-15: qty 8

## 2019-12-15 MED ORDER — DEXTROSE 50 % IV SOLN
0.0000 mL | INTRAVENOUS | Status: DC | PRN
  Administered 2019-12-17 (×2): 50 mL via INTRAVENOUS
  Filled 2019-12-15 (×2): qty 50

## 2019-12-15 MED ORDER — DEXTROSE 10 % IV SOLN: INTRAVENOUS | Status: DC | PRN

## 2019-12-15 MED ORDER — SODIUM CHLORIDE 0.9 % IV SOLN
2.0000 g | Freq: Two times a day (BID) | INTRAVENOUS | Status: DC
  Administered 2019-12-15 (×2): 2 g via INTRAVENOUS
  Filled 2019-12-15 (×4): qty 2

## 2019-12-15 MED ORDER — MIDAZOLAM HCL 2 MG/2ML IJ SOLN: 1.0000 mg | INTRAMUSCULAR | Status: DC | PRN

## 2019-12-15 MED ORDER — INSULIN ASPART 100 UNIT/ML ~~LOC~~ SOLN
3.0000 [IU] | SUBCUTANEOUS | Status: DC
  Administered 2019-12-15: 9 [IU] via SUBCUTANEOUS
  Administered 2019-12-15: 6 [IU] via SUBCUTANEOUS
  Administered 2019-12-15: 9 [IU] via SUBCUTANEOUS

## 2019-12-15 NOTE — Progress Notes (Signed)
SLP Cancellation Note  Patient Details Name: [name] Bean MRN: [id number] DOB: 1950-05-30   Cancelled treatment:       Reason Eval/Treat Not Completed: Medical issues which prohibited therapy (Pt remains on vent and per RN has had increased respiratory needs recently. SLP will sign off at this time, but please reconsult when clinically indicated.)  Densil Ottey I. Hardin Negus, [place], [place] Office number [phone] Pager [place] 12/15/2019, 11:23 AM

## 2019-12-15 NOTE — Progress Notes (Signed)
   12/15/19 1050  Clinical Encounter Type  Visited With Health care provider  Visit Type Follow-up  Referral From Nurse  Consult/Referral To Chaplain   Pt's sister has paperwork ready. Chaplain Genesis already spoke with pt's sister. Will follow-up Tuesday once notary is available to see if pt is alert and coherent for AD.   This note was prepared by Chaplain Resident, Dante Gang, MDiv. For questions, please contact by phone at [phone].

## 2019-12-15 NOTE — Progress Notes (Signed)
[facility] Progress Note Patient Name: [name] Bean DOB: [birth year] MRN: [id number]   Date of Service  12/15/2019  HPI/Events of Note  Hypoxia - Related to agitation and ventilator asynchrony.   eICU Interventions  Plan: 1. Increase ceiling on Precedex IV infusion to 1.6 mcg/kg/hour.  2. Titrate Fentanyl IV infusion as ordered. 3. Versed 1-2 mg IV Q 2 hours PRN agitation or sedation.      Intervention Category Major Interventions: Hypoxemia - evaluation and management;Delirium, psychosis, severe agitation - evaluation and management  [name] 12/15/2019, 12:28 AM

## 2019-12-15 NOTE — Progress Notes (Signed)
Pharmacy Antibiotic Note  [name] Bean is a 69 y.o. female admitted on 12/03/2019 with pneumonia. Fevers resolved, wbc 22.4 trending up, Scr 1.3 stable. TA with Kleb R to amp S to ancef. Most recent BAL with enterobacter. Pharmacy asked to switch cefepime to meropenem.  Plan: Stop cefepime Start meropenem 2g IV q12 hrs Monitor renal function and clinical progression  Height: _0  (157.5 cm) Weight: 87.2 kg (192 lb 3.9 oz) IBW/kg (Calculated) : 50.1  Temp (24hrs), Avg:99.3 F (37.4 C), Min:98.6 F (37 C), Max:100.9 F (38.3 C)  Recent Labs  Lab 12/30/2019 0358 12/20/2019 1442 01/09/2020 1644 12/20/2019 1819 12/14/19 0416 12/15/19 0250  WBC 17.5*  --  14.3* 15.2* 17.3* 22.4*  CREATININE 1.46* 1.20* 1.44*  --  1.38* 1.31*    Estimated Creatinine Clearance: 42.7 mL/min (A) (by C-G formula based on SCr of 1.31 mg/dL (H)).    Allergies  Allergen Reactions  . Sitagliptin Other (See Comments)    Had pancreatitis while on Januvia     Antimicrobials this admission: Ceftriaxone 8/30 >> 8/31 Unasyn 8/24 >> 8/29 for asp PNA Azithro 8/24 >> 8/24 Cefepime 8/31>9/3 Meropenem 9/3>>  Dose adjustments this admission:   Microbiology results: 8/24 BCx: ngtd 8/28 Resp Cx: klebsiella R amp, S ancef  8/31 BAL cx: enterobacter (R ancef, ceftaz, Zosyn)  Richardine Service, PharmD PGY2 Cardiology Pharmacy Resident Phone: [phone] 12/15/2019  11:08 AM  Please check [email or web address] for unit-specific pharmacy phone numbers.

## 2019-12-15 NOTE — Progress Notes (Signed)
[facility] Progress Note Patient Name: [name] Bean DOB: 28-Oct-1950 MRN: [id number]   Date of Service  12/15/2019  HPI/Events of Note  EndoTool prompt to transition to Levemir + Novolog SSI.   eICU Interventions  Will transition to Levemir 10 units [place] now and Q 12 hours + Q 4 hour resistant Novolog SSI.     Intervention Category Major Interventions: Hyperglycemia - active titration of insulin therapy  Lysle Dingwall 12/15/2019, 5:39 AM

## 2019-12-15 NOTE — Progress Notes (Addendum)
Advanced Heart Failure Rounding Note   Subjective:    Remains intubated and sedated. NE discontinued yesterday. Co-ox marginal 55% (down from 65% yesterday).   SBPs in the low 90s. Cuff MAPs low 60s.   Volume status elevated, CVP up to 20 today. Wt up 7 lb.   S/P 1UPRBCs on 9/1. Hgb stable, 8.1 today.   Her sister is present at bedside.   EF 45% on echo   Prox RCA to Mid RCA lesion is 50% stenosed.  Previously placed Mid RCA to Dist RCA stent (unknown type) is widely patent.  Dist RCA lesion is 95% stenosed. S/P DES to Dist RCA. RA 8 PCW 14  CO 4.7 CI 2.5     Objective:   Weight Range:  Vital Signs:   Temp:  [98.6 F (37 C)-99.4 F (37.4 C)] 98.6 F (37 C) (09/03 0722) Pulse Rate:  [68-110] 75 (09/03 0600) Resp:  [16-38] 16 (09/03 0600) BP: (87-192)/(43-93) 92/49 (09/03 0600) SpO2:  [71 %-100 %] 91 % (09/03 0600) FiO2 (%):  [30 %-100 %] 100 % (09/03 0542) Weight:  [87.2 kg] 87.2 kg (09/03 0500) Last BM Date: 12/14/19  Weight change: Filed Weights   01/09/2020 0500 12/14/19 0415 12/15/19 0500  Weight: 86.1 kg 84 kg 87.2 kg    Intake/Output:   Intake/Output Summary (Last 24 hours) at 12/15/2019 0837 Last data filed at 12/15/2019 0600 Gross per 24 hour  Intake 2144.16 ml  Output 2020 ml  Net 124.16 ml     Physical Exam: CVP 20  General:  Intubated and sedated  HEENT: + ETT Neck: supple. Thick neck, JVD assessment difficult.  Carotids 2+ bilat; no bruits. No lymphadenopathy or thryomegaly appreciated. Cor: PMI nondisplaced. Regular rate & rhythm. No rubs, gallops or murmurs. Lungs: intubated, clear bilaterally anteriorly  Abdomen: soft, nontender, nondistended. No hepatosplenomegaly. No bruits or masses. Good bowel sounds. Extremities: no cyanosis, clubbing, rash, edema + SCDs, bilateral knees cool to touch  Neuro: intubated and sedated on vent   Telemetry: NSR 70s    Labs: Basic Metabolic Panel: Recent Labs  Lab 12/11/19 0353 12/11/19 0353  12/12/19 0520 12/12/19 1147 12/12/19 1237 12/12/19 1237 12/12/19 2154 12/20/2019 0358 12/16/2019 0358 12/17/2019 1442 01/04/2020 1446 12/22/2019 1447 12/14/2019 1452 12/27/2019 1644 12/14/19 0416 12/15/19 0003 12/15/19 0250  NA 147*   < > 150*   < > 148*   < >  --  149*   < > 153*   < > 153* 154*  --  148* 150* 148*  K 3.0*   < > 3.2*   < > 4.2   < >  --  3.5   < > 3.4*   < > 2.9* 3.0*  --  3.2* 3.4* 3.8  CL 107   < > 110  --  108  --   --  112*  --  116*  --   --   --   --  113*  --  117*  CO2 26   < > 28  --  26  --   --  25  --   --   --   --   --   --  24  --  23  GLUCOSE 141*   < > 108*  --  248*  --   --  171*  --  172*  --   --   --   --  217*  --  129*  BUN 28*   < >  23  --  26*  --   --  40*  --  41*  --   --   --   --  39*  --  43*  CREATININE 1.14*   < > 1.00  --  1.18*   < >  --  1.46*  --  1.20*  --   --   --  1.44* 1.38*  --  1.31*  CALCIUM 8.7*   < > 8.9  --  8.6*   < >  --  8.4*  --   --   --   --   --   --  8.3*  --  8.5*  MG 2.3  --   --   --  2.0  --  2.0 2.4  --   --   --   --   --  2.2  --   --   --   PHOS  --   --   --   --  3.6  --  2.4* 3.7  --   --   --   --   --  4.1  --   --   --    < > = values in this interval not displayed.    Liver Function Tests: No results for input(s): AST, ALT, ALKPHOS, BILITOT, PROT, ALBUMIN in the last 168 hours. No results for input(s): LIPASE, AMYLASE in the last 168 hours. No results for input(s): AMMONIA in the last 168 hours.  CBC: Recent Labs  Lab 12/25/2019 0358 01/09/2020 1442 01/05/2020 1644 01/02/2020 1819 12/14/19 0416 12/15/19 0003 12/15/19 0250  WBC 17.5*  --  14.3* 15.2* 17.3*  --  22.4*  HGB 7.9*   < > 6.8* 7.1* 7.8* 8.8* 8.1*  HCT 25.6*   < > 22.7* 23.5* 25.0* 26.0* 25.9*  MCV 101.2*  --  100.9* 100.9* 99.6  --  102.0*  PLT 310  --  283 297 267  --  309   < > = values in this interval not displayed.    Cardiac Enzymes: No results for input(s): CKTOTAL, CKMB, CKMBINDEX, TROPONINI in the last 168 hours.  BNP: BNP  (last 3 results) Recent Labs    12/08/19 0324 12/09/19 0521 12/10/19 0450  BNP 228.5* 136.4* 611.7*    ProBNP (last 3 results) No results for input(s): PROBNP in the last 8760 hours.    Other results:  Imaging: DG Chest 1 View  Result Date: 12/15/2019 CLINICAL DATA:  ARDS.  Intubation. EXAM: CHEST  1 VIEW COMPARISON:  12/14/2019. FINDINGS: Endotracheal tube, NG tube, left IJ line in stable position. Heart size normal. Diffuse dense bilateral pulmonary infiltrates/edema are again noted, progressed from prior exam. No pleural effusion or pneumothorax. Prior cervical spine fusion. IMPRESSION: 1.  Lines and tubes in stable position. 2. Diffuse dense bilateral pulmonary infiltrates/edema again noted. Interim progression from prior exam. Electronically Signed   By: Marcello Moores  Register   On: 12/15/2019 08:23   DG Chest 1 View  Result Date: 12/14/2019 CLINICAL DATA:  History of respiratory failure with endotracheal tube. EXAM: CHEST  1 VIEW COMPARISON:  12/15/2019 FINDINGS: Endotracheal tube approximately 4 cm above the carina, unchanged accounting for rotation and position. LEFT-sided central venous access device terminates at the caval to atrial junction. Gastric tube courses through in off the field of the radiograph. Tip below the LEFT hemidiaphragm. Cardiomediastinal contours mildly enlarged and accentuated by portable technique. Diffuse interstitial and airspace opacities bilaterally in the chest showing perhaps slight worsening  in the RIGHT upper lobe compared to the previous study. LEFT and RIGHT hemidiaphragm remain obscured. The no acute skeletal process. IMPRESSION: 1. Slight worsening of, particularly in the RIGHT upper lobe, of bilateral interstitial and airspace opacities with effusions suggestive of multifocal pneumonia or edema. Follow-up is suggested to ensure resolution. 2. Unchanged support lines and tubes, tip below the LEFT hemidiaphragm. Electronically Signed   By: Zetta Bills M.D.    On: 12/14/2019 08:38   CARDIAC CATHETERIZATION  Result Date: 12/17/2019  Prox RCA to Mid RCA lesion is 50% stenosed.  Previously placed Mid RCA to Dist RCA stent (unknown type) is widely patent.  Dist RCA lesion is 95% stenosed.  Post intervention, there is a 0% residual stenosis.  A drug-eluting stent was successfully placed using a STENT RESOLUTE ONYX 2.0X15.  Successful angioplasty and drug-eluting stent placement to distal right coronary artery. Recommendations: Dual antiplatelet therapy for at least 12 months. Continue vent management and supportive care. Aggressive treatment of cardiovascular risk factors.   CARDIAC CATHETERIZATION  Result Date: 12/26/2019  Prox RCA to Mid RCA lesion is 50% stenosed.  Previously placed Mid RCA to Dist RCA stent (unknown type) is widely patent.  Dist RCA lesion is 95% stenosed.  Prox Cx to Mid Cx lesion is 40% stenosed.  Mid LAD lesion is 40% stenosed.  Previously placed Dist LAD stent (unknown type) is widely patent.  Findings: Ao = 103/45 (65) LV = 105/12 RA =  8 RV = 36/8 PA = 32/14 (21) PCW = 14 Fick cardiac output/index = 4.7/2.5 PVR = 1.5 WU FA sat = 97% PA sat = 47%, 49% Assessment: 1. CAD with patent stents in RCA and LAD. 2. 95% stenosis in distal RCA otherwise non-obstructive CAD 3. EF hard to assess ~ 40-45% 4. Normal filling pressures with low MV sat but preserved CO Plan/Discussion: Will plan PCI of distal RCA. Glori Bickers, MD 3:11 PM     Medications:     Scheduled Medications: . aspirin  81 mg Per Tube Daily  . chlorhexidine gluconate (MEDLINE KIT)  15 mL Mouth Rinse BID  . Chlorhexidine Gluconate Cloth  6 each Topical Daily  . clopidogrel  75 mg Per Tube Q breakfast  . enoxaparin (LOVENOX) injection  40 mg Subcutaneous Q24H  . feeding supplement (PROSource TF)  45 mL Per Tube BID  . free water  200 mL Per Tube Q4H  . insulin aspart  3 Units Subcutaneous Q4H  . insulin aspart  3-9 Units Subcutaneous Q4H  . insulin detemir   10 Units Subcutaneous Q12H  . mouth rinse  15 mL Mouth Rinse 10 times per day  . pantoprazole (PROTONIX) IV  40 mg Intravenous Daily  . rosuvastatin  40 mg Per Tube Daily  . sertraline  50 mg Per Tube Daily  . sodium chloride flush  10 mL Intracatheter Q12H  . sodium chloride flush  3 mL Intravenous Q12H    Infusions: . sodium chloride    . ceFEPime (MAXIPIME) IV Stopped (12/14/19 2234)  . dexmedetomidine (PRECEDEX) IV infusion 1.6 mcg/kg/hr (12/15/19 0726)  . dextrose    . feeding supplement (VITAL AF 1.2 CAL) 1,000 mL (12/14/19 1535)  . fentaNYL infusion INTRAVENOUS 400 mcg/hr (12/15/19 0600)  . norepinephrine (LEVOPHED) Adult infusion Stopped (12/14/19 1042)  . propofol (DIPRIVAN) infusion      PRN Medications: sodium chloride, acetaminophen (TYLENOL) oral liquid 160 mg/5 mL, dextrose, fentaNYL, fentaNYL (SUBLIMAZE) injection, fentaNYL (SUBLIMAZE) injection, ipratropium-albuterol, labetalol, midazolam, ondansetron (ZOFRAN) IV, Resource ThickenUp  Clear, sodium chloride flush   Assessment/Plan :   1. PEA Arrest x 2 - suspect due to hypoxia, ? Pulmonary edema vs aspiration  Had previous PEA arrest in 2018 due to pulmonary edema.  - EF 60% -> 45% - NSTEMI this admit, s/p DES to RCA  - remains intubated  2. Acute Hypoxic Respiratory Failure - 8/24 intubated --> extubated 8/26 -->reintubated 8/26 -->extubated 8/29--> reintubated 12/12/19. CXR concerning for ARDs +/- edema - Respiratory Cx: klebsiella --> switched from rocephin to cefepime.  - S/P Bronch 8/31 with mucus plug possible food,  BAL growing Enterobacter (sensitive to cefepime) - vent management per PCCM  - Will need speech consult once extubated.   3. Shock - suspect mix of sepsis/cardiogenic - co-ox lower off NE, 55% today. SBPs low 90s.  - needs diuresis, resume NE for inotropic + BP support to aid in diuresis   4. A/C Diastolic HF  -ECHO 08/09/7679 EF 55-60% RV normal -> 12/12/19 EF 45% - Volume status  elevated, Wt up 7lb CVP 20. Co-ox marginal and BP soft - Restart low dose NE for BP/inotropic support - Start 80 IV Lasix BID  - Supp K w/ IV Lasix   5. NSTEMI  - H/O CAD with S/P PCI Stent to RCA 2002 and distal LAD 2015 - HS Trop 157>2620 - On statin  - S/P LHC with Prox RCA to Mid RCA lesion is 50% stenosed.  Previously placed Mid RCA to Dist RCA stent (unknown type) is widely patent.  Dist RCA lesion is 95% stenosed.  S/P DES to Distal RCA -Dual antiplatelet therapy for at least 12 months. ASA + Plavix -Continue Crestor 40 mg daily. LDL 51 mg/dL   6. DMII - On SSI   7. Anemia  - On 8/26 hgb 11.2.  - Hgb after cath 6.1 ---> received 1UPRBCs --> 8.1 today (stable) - No obvious source.  - Continue SCDs for DVT prophylaxis.   Length of Stay: [place] PA-C  12/15/2019, 8:37 AM  Advanced Heart Failure Team Pager [phone] (M-F; 7a - 4p)  Please contact [facility] for night-coverage after hours (4p -7a ) and weekends on [email or web address]  Agree with above.   Sedated on vent. NE off. CVP up to 20 and co-ox back down. BP soft. CXR with diffuse infiltrates  General:  Sedated on vent HEENT: normal Neck: supple. JVP to ear . Carotids 2+ bilat; no bruits. No lymphadenopathy or thryomegaly appreciated. Cor: PMI nondisplaced. Regular rate & rhythm. No rubs, gallops or murmurs. Lungs: diffuse crackles  Abdomen: soft, nontender, nondistended. No hepatosplenomegaly. No bruits or masses. Good bowel sounds. Extremities: no cyanosis, clubbing, rash,1+ edema Neuro: sedated on vent   CXR with severe diffuse infiltrates. Suspect combination of aspiration PNA/ARDS + pulmonary edema. Restart NE. Needs diuresis. Not ready for extubation. Hopefully when time for extubation PCI with help limit ischemic burden and prevent recurrent respiratory failure. Suspect recurrent aspiration is a large pqrt of her presentation as well.   CRITICAL CARE Performed by: Glori Bickers  Total critical care time: 40 minutes  Critical care time was exclusive of separately billable procedures and treating other patients.  Critical care was necessary to treat or prevent imminent or life-threatening deterioration.  Critical care was time spent personally by me (independent of midlevel providers or residents) on the following activities: development of treatment plan with patient and/or surrogate as well as nursing, discussions with consultants, evaluation of patient's response to treatment, examination of patient, obtaining history  from patient or surrogate, ordering and performing treatments and interventions, ordering and review of laboratory studies, ordering and review of radiographic studies, pulse oximetry and re-evaluation of patient's condition.  Glori Bickers, MD  9:54 AM

## 2019-12-15 NOTE — Progress Notes (Signed)
NAME:  [name] Bean, MRN:  [id number], DOB:  1950/12/14, LOS: 10 ADMISSION DATE:  11/26/2019, CONSULTATION DATE:  11/16/2019 REFERRING MD:  Ralene Bathe - EM , CHIEF COMPLAINT:  Respiratory arrest, hypoxemia   Brief History   69yo female presented to ED after PEA cardiac arrest likely due to respiratory failure.  History of present illness   [name] Bean is a 69 y.o. with PHX significant for HTN, HLD, DM, CHF, cardiac arrest, CAD, and anxiety who presented to ED s/p cardiac arrest. Per report patient called EMS with acute complaints of SOB and on arrival EMS found patient unresponsive in PEA arrest. EMS provided 3 rounds of CPR and obtained ROSC prior to ED arrival. No ACLS medications were given. PCCM called urgently to evaluate pt in ED due to hypoxemia.   CXR with bilateral ASD, concerning for possible ARDS. ABG 7.2/52/48 Labs acquired in ED are hemolyzed, resent but unavailable for review   Past Medical History   Diastolic heart failure HTN DM Prior PEA arrest CAD   Significant Hospital Events   8/24 BIB Ems for CC SOB. On EMS arrival, pulseless and hypoxemic. ROSC achieved. PCCM consulted for hypoxemia shortly after intubation. CVC and Aline placed by PCCM in ED 8/25 [facility] w/ much improved aeration. Able to start weaning PEEP/FIO2 down to 10 from 14 and FIO2 down from 100% to 70%. Transitioning OFF insulin gtt. Transitioning from Fent/propofol to precedex.  8/26: Seen by cardiology on the 25th for non-ST elevation MI.  Started on IV heparin and aspirin on the 25th.  Past spontaneous breathing trial on the morning of 8/26 and was extubated.  Did have some fairly significant hypertension post extubation-->progressed to rapid resp failure and subsequent PEA arrest. Re intubated. Time to ROSC 9 minutes.  8/27: More awake.  Weaning PEEP and FiO2.  Continuing to diurese and maximize hemodynamics not ready for extubation Consults:  CHF  Procedures:  8/24 ETT>8/26>>> 8/24  CVC> 8/24 Aline>  Significant Diagnostic Tests:  8/24 CXR> Diffuse bilateral pulmonary opacities. ETT 2.5cm above carina.  8/24 CTA chest>>1. No CT evidence of pulmonary embolism.2. Small bilateral pleural effusions with findings of pulmonary edema or ARDS.3. Clusters of airspace consolidation primarily involving the left lower lobe as well as upper lobes may represent pneumonia. Clinical correlation recommended.4. Aortic Atherosclerosis (ICD10-I70.0). 8/24 CT H >> negative  ECHO 8/24: Left Ventricle: Endocardial border definition is poor but systolic  function appears to be grossly normal. Cannot rule out mild septal  hypokinesis. Left ventricular ejection fraction, by estimation, is 55 to 60%. The left ventricle has normal function. The left ventricle has no regional wall motion abnormalities. Definity contrast agent was given IV to delineate the left ventricular endocardial borders. The left ventricular internal cavity size was normal in size. There is no left ventricular hypertrophy. Left ventricular diastolic parameters are indeterminate.   9/1 RHC, LHC Findings: Ao = 103/45 (65) LV = 105/12 RA =  8 RV = 36/8 PA = 32/14 (21) PCW = 14 Fick cardiac output/index = 4.7/2.5 PVR = 1.5 WU FA sat = 97%  PA sat = 47%, 49% Assessment: 1. CAD with patent stents in RCA and LAD.  2. 95% stenosis in distal RCA otherwise non-obstructive CAD 3. EF hard to assess ~ 40-45% 4. Normal filling pressures with low MV sat but preserved CO Plan/Discussion: Will plan PCI of distal RCA.    Micro Data:  8/24 SARS Cov2> neg 8/24 RVP >> 8/24 Tracheal aspirate >>klebsiella 8/24 BCx>> 8/28 sputum  klebsiella  8/31 BAL>>  Antimicrobials:  See micro tab   Interim history/subjective:  Worsened oxygenation on vent. Fighting vent throughout night despite heavy sedation.  Objective   Blood pressure (!) 93/48, pulse 73, temperature 98.6 F (37 C), resp. rate 14, height _0  (1.575 m), weight 87.2 kg,  SpO2 (!) 89 %. CVP:  [8 mmHg-25 mmHg] 20 mmHg  Vent Mode: PRVC FiO2 (%):  [60 %-100 %] 100 % Set Rate:  [20 bmp] 20 bmp Vt Set:  [400 mL] 400 mL PEEP:  [5 cmH20-10 cmH20] 10 cmH20 Plateau Pressure:  [12 cmH20] 12 cmH20   Intake/Output Summary (Last 24 hours) at 12/15/2019 1045 Last data filed at 12/15/2019 1000 Gross per 24 hour  Intake 2174.16 ml  Output 1980 ml  Net 194.16 ml   Filed Weights   12/15/2019 0500 12/14/19 0415 12/15/19 0500  Weight: 86.1 kg 84 kg 87.2 kg    Examination:  GEN: no acute distress on vent HEENT: ETT in place, moderate mucoid secretions CV: RRR, ext warm PULM: rhonci bilaterally, no wheezing GI: Soft, +BS EXT: No edema NEURO: more sedated today PSYCH: RASS -4 SKIN: No rashes  WBC up Hgb stable Plts stable TG okay Sodium up  Resolved Hospital Problem list    Assessment & Plan:    Acute diastolic HF w/ Pulmonary edema, with non-ST elevation MI- s/p DES to distal RCA, hopefully this reduces her risk of post-extubation pulmonary edema. - Continue Crestor - Discussion of further diuresis with Dr. Missy Sabins, antiplatelets per his direction  Hx HTN - Goals SBP < 160, with sedation antihypertensives are on hold  Recurrent Acute respiratory failure with hypoxemia in the setting of diffuse pulmonary edema; has had PEA arrest twice from this. Likely recurrent aspiration as well based on MBSS Klebsiella PNA- treated Now Enterobacter HCAP - Switch cefepime to meropenem - VAP bundle - Check rheum panel although suspect low yield: ESR/CRP elevated, RF mildly elevated; f/u ANA, CCP, ANCA  Fluid and electrolyte imbalance: Intermittent Mild AKI-  Stable - Continue FWF  DM w/ hyperglycemia  - sliding-scale insulin with current Levemir dosing - Blood glucose goal 140-180  Dysphagia- MBSS showed persistent aspiration with thin and nectar thick liquids.  Recommended for honey thickened when able - Re address once off vent  GOC- we were going to  have extubation trial today unfortunately she has worsened.  Today plan is to try to squeeze more fluid from her, switch abx to meropenem (? induced resistance).  Try to sedate heavier for better vent synchrony, may need paralysis and proning.  Best practice:  Diet: TF Pain/Anxiety/Delirium protocol (if indicated): propofol/fentanyl VAP protocol (if indicated): on DVT prophylaxis: lovenox GI prophylaxis: PPI  Glucose control: see above Mobility: BR Code Status: Full  Family Communication: updated at bedside Disposition: ICU  The patient is critically ill with multiple organ systems failure and requires high complexity decision making for assessment and support, frequent evaluation and titration of therapies, application of advanced monitoring technologies and extensive interpretation of multiple databases. Critical Care Time devoted to patient care services described in this note independent of APP/resident time (if applicable)  is 38 minutes.   Erskine Emery MD [facility] 12/15/2019 10:45 AM Personal pager: [phone] If unanswered, please page CCM On-call: [phone]

## 2019-12-16 ENCOUNTER — Inpatient Hospital Stay (HOSPITAL_COMMUNITY): Payer: Medicare PPO

## 2019-12-16 LAB — COOXEMETRY PANEL
Carboxyhemoglobin: 1.4 % (ref 0.5–1.5)
Carboxyhemoglobin: 0.7 % (ref 0.5–1.5)
Carboxyhemoglobin: 0.9 % (ref 0.5–1.5)
Methemoglobin: 1.1 % (ref 0.0–1.5)
Methemoglobin: 0.7 % (ref 0.0–1.5)
Methemoglobin: 0.6 % (ref 0.0–1.5)
O2 Saturation: 87.7 %
O2 Saturation: 91.5 %
O2 Saturation: 86.7 %
Total hemoglobin: 10.3 g/dL — ABNORMAL LOW (ref 12.0–16.0)
Total hemoglobin: 13.2 g/dL (ref 12.0–16.0)
Total hemoglobin: 10 g/dL — ABNORMAL LOW (ref 12.0–16.0)

## 2019-12-16 LAB — BASIC METABOLIC PANEL
Anion gap: 12 (ref 5–15)
BUN: 68 mg/dL — ABNORMAL HIGH (ref 8–23)
CO2: 19 mmol/L — ABNORMAL LOW (ref 22–32)
Calcium: 7.9 mg/dL — ABNORMAL LOW (ref 8.9–10.3)
Chloride: 114 mmol/L — ABNORMAL HIGH (ref 98–111)
Creatinine, Ser: 2.96 mg/dL — ABNORMAL HIGH (ref 0.44–1.00)
GFR calc Af Amer: 18 mL/min — ABNORMAL LOW (ref >60)
GFR calc non Af Amer: 16 mL/min — ABNORMAL LOW (ref >60)
Glucose, Bld: 288 mg/dL — ABNORMAL HIGH (ref 70–99)
Potassium: 5 mmol/L (ref 3.5–5.1)
Sodium: 145 mmol/L (ref 135–145)

## 2019-12-16 LAB — CBC
HCT: 25.1 % — ABNORMAL LOW (ref 36.0–46.0)
Hemoglobin: 7.5 g/dL — ABNORMAL LOW (ref 12.0–15.0)
MCH: 31.6 pg (ref 26.0–34.0)
MCHC: 29.9 g/dL — ABNORMAL LOW (ref 30.0–36.0)
MCV: 105.9 fL — ABNORMAL HIGH (ref 80.0–100.0)
Platelets: 330 10*3/uL (ref 150–400)
RBC: 2.37 MIL/uL — ABNORMAL LOW (ref 3.87–5.11)
RDW: 15 % (ref 11.5–15.5)
WBC: 23.5 10*3/uL — ABNORMAL HIGH (ref 4.0–10.5)
nRBC: 0.7 % — ABNORMAL HIGH (ref 0.0–0.2)

## 2019-12-16 LAB — GLUCOSE, CAPILLARY
Glucose-Capillary: 324 mg/dL — ABNORMAL HIGH (ref 70–99)
Glucose-Capillary: 295 mg/dL — ABNORMAL HIGH (ref 70–99)
Glucose-Capillary: 278 mg/dL — ABNORMAL HIGH (ref 70–99)
Glucose-Capillary: 231 mg/dL — ABNORMAL HIGH (ref 70–99)
Glucose-Capillary: 198 mg/dL — ABNORMAL HIGH (ref 70–99)
Glucose-Capillary: 178 mg/dL — ABNORMAL HIGH (ref 70–99)
Glucose-Capillary: 179 mg/dL — ABNORMAL HIGH (ref 70–99)
Glucose-Capillary: 163 mg/dL — ABNORMAL HIGH (ref 70–99)
Glucose-Capillary: 178 mg/dL — ABNORMAL HIGH (ref 70–99)
Glucose-Capillary: 177 mg/dL — ABNORMAL HIGH (ref 70–99)
Glucose-Capillary: 248 mg/dL — ABNORMAL HIGH (ref 70–99)
Glucose-Capillary: 332 mg/dL — ABNORMAL HIGH (ref 70–99)
Glucose-Capillary: 365 mg/dL — ABNORMAL HIGH (ref 70–99)

## 2019-12-16 LAB — TRIGLYCERIDES: Triglycerides: 333 mg/dL — ABNORMAL HIGH (ref <150)

## 2019-12-16 MED ORDER — SODIUM CHLORIDE 0.9 % IV SOLN
1.0000 g | Freq: Two times a day (BID) | INTRAVENOUS | Status: DC
  Administered 2019-12-16 (×2): 1 g via INTRAVENOUS
  Filled 2019-12-16 (×4): qty 1

## 2019-12-16 MED ORDER — INSULIN ASPART 100 UNIT/ML ~~LOC~~ SOLN
5.0000 [IU] | SUBCUTANEOUS | Status: DC
  Administered 2019-12-16 – 2019-12-17 (×5): 5 [IU] via SUBCUTANEOUS

## 2019-12-16 MED ORDER — METOLAZONE 5 MG PO TABS
5.0000 mg | ORAL_TABLET | Freq: Once | ORAL | Status: AC
  Administered 2019-12-16: 5 mg
  Filled 2019-12-16: qty 1

## 2019-12-16 MED ORDER — ENOXAPARIN SODIUM 30 MG/0.3ML ~~LOC~~ SOLN
30.0000 mg | SUBCUTANEOUS | Status: DC
  Administered 2019-12-17: 30 mg via SUBCUTANEOUS
  Filled 2019-12-16: qty 0.3

## 2019-12-16 MED ORDER — FUROSEMIDE 10 MG/ML IJ SOLN
15.0000 mg/h | INTRAVENOUS | Status: DC
  Administered 2019-12-17: 15 mg/h via INTRAVENOUS
  Filled 2019-12-16 (×3): qty 25
  Filled 2019-12-16: qty 21

## 2019-12-16 MED ORDER — INSULIN DETEMIR 100 UNIT/ML ~~LOC~~ SOLN
20.0000 [IU] | Freq: Two times a day (BID) | SUBCUTANEOUS | Status: DC
  Administered 2019-12-16 (×2): 20 [IU] via SUBCUTANEOUS
  Filled 2019-12-16 (×4): qty 0.2

## 2019-12-16 MED ORDER — INSULIN ASPART 100 UNIT/ML ~~LOC~~ SOLN
0.0000 [IU] | SUBCUTANEOUS | Status: DC
  Administered 2019-12-16: 5 [IU] via SUBCUTANEOUS
  Administered 2019-12-16: 3 [IU] via SUBCUTANEOUS
  Administered 2019-12-16: 11 [IU] via SUBCUTANEOUS
  Administered 2019-12-16: 15 [IU] via SUBCUTANEOUS
  Administered 2019-12-17: 11 [IU] via SUBCUTANEOUS

## 2019-12-16 NOTE — Progress Notes (Signed)
Pharmacy Antibiotic Note  Dechelle Attaway is a 69 y.o. female admitted on 11/29/2019 with pneumonia. TA with Kleb R to amp S to ancef. Most recent BAL with enterobacter. Patient was switched to meropenem on 9/3.   Still spiking fevers (Tm 100.9), wbc up slightly 23.5, Scr up significantly from 1.3 yesterday to 2.96 today (CrCl 19). Will adjust dose for renal function.  Plan: Decrease Meropenem to 1g IV q12 hrs Monitor renal function and clinical progression  Height: _0  (157.5 cm) Weight: 88.7 kg (195 lb 8.8 oz) IBW/kg (Calculated) : 50.1  Temp (24hrs), Avg:99.1 F (37.3 C), Min:98 F (36.7 C), Max:100.9 F (38.3 C)  Recent Labs  Lab 12/28/2019 0358 12/25/2019 1442 12/26/2019 1644 01/01/2020 1819 12/14/19 0416 12/15/19 0250 12/16/19 0400  WBC   < >  --  14.3* 15.2* 17.3* 22.4* 23.5*  CREATININE  --  1.20* 1.44*  --  1.38* 1.31* 2.96*   < > = values in this interval not displayed.    Estimated Creatinine Clearance: 19.1 mL/min (A) (by C-G formula based on SCr of 2.96 mg/dL (H)).    Allergies  Allergen Reactions  . Sitagliptin Other (See Comments)    Had pancreatitis while on Januvia     Antimicrobials this admission: Ceftriaxone 8/30 >> 8/31 Unasyn 8/24 >> 8/29 for asp PNA Azithro 8/24 >> 8/24 Cefepime 8/31>9/3 Meropenem 9/3>>  Dose adjustments this admission: 9/4: CrCl 19 - Meropenem 2g to 1g Q12h  Microbiology results: 8/24 BCx: ngtd 8/28 Resp Cx: klebsiella R amp, S ancef  8/31 BAL cx: enterobacter (R ancef, ceftaz, Zosyn)  Richardine Service, PharmD PGY2 Cardiology Pharmacy Resident Phone: [phone] 12/16/2019  9:34 AM  Please check [email or web address] for unit-specific pharmacy phone numbers.

## 2019-12-16 NOTE — Progress Notes (Addendum)
Advanced Heart Failure Rounding Note   Subjective:    Remains intubated (FiO2 0.8) and sedated. NE weaned off again.  Co-ox 88% this am (inaccurate)  Hgb 7.5. WBC 22.4 -> 23.5   Received IV lasix. Recorded +2.6L  No weight  CVP 15 (measured personally)  EF 45% on echo   Prox RCA to Mid RCA lesion is 50% stenosed.  Previously placed Mid RCA to Dist RCA stent (unknown type) is widely patent.  Dist RCA lesion is 95% stenosed. S/P DES to Dist RCA. RA 8 PCW 14  CO 4.7 CI 2.5     Objective:   Weight Range:  Vital Signs:   Temp:  [98.6 F (37 C)-100.9 F (38.3 C)] 98.7 F (37.1 C) (09/03 2339) Pulse Rate:  [73-110] 89 (09/04 0400) Resp:  [12-31] 18 (09/04 0400) BP: (92-144)/(34-105) 130/51 (09/04 0400) SpO2:  [75 %-100 %] 100 % (09/04 0400) FiO2 (%):  [80 %-100 %] 80 % (09/04 0203) Weight:  [87.2 kg] 87.2 kg (09/03 0500) Last BM Date: 12/15/19  Weight change: Filed Weights   12/16/2019 0500 12/14/19 0415 12/15/19 0500  Weight: 86.1 kg 84 kg 87.2 kg    Intake/Output:   Intake/Output Summary (Last 24 hours) at 12/16/2019 0442 Last data filed at 12/16/2019 0400 Gross per 24 hour  Intake 3808.06 ml  Output 820 ml  Net 2988.06 ml     Physical Exam: General:  Intubated and sedated  HEENT: normal + ETT Neck: supple.JVP up Carotids 2+ bilat; no bruits. No lymphadenopathy or thryomegaly appreciated. Cor: PMI nondisplaced. Regular rate & rhythm. No rubs, gallops or murmurs. Lungs: crackles Abdomen: soft, nontender, nondistended. No hepatosplenomegaly. No bruits or masses. Good bowel sounds. Extremities: no cyanosis, clubbing, rash, edema Neuro: sedated on vent     Telemetry: NSR 90s Personally reviewed    Labs: Basic Metabolic Panel: Recent Labs  Lab 12/11/19 0353 12/11/19 0353 12/12/19 0520 12/12/19 1147 12/12/19 1237 12/12/19 1237 12/12/19 2154 12/21/2019 0358 01/06/2020 0358 12/30/2019 1442 12/21/2019 1446 01/05/2020 1447 12/30/2019 1452 12/27/2019 1644  12/14/19 0416 12/15/19 0003 12/15/19 0250  NA 147*   < > 150*   < > 148*   < >  --  149*   < > 153*   < > 153* 154*  --  148* 150* 148*  K 3.0*   < > 3.2*   < > 4.2   < >  --  3.5   < > 3.4*   < > 2.9* 3.0*  --  3.2* 3.4* 3.8  CL 107   < > 110  --  108  --   --  112*  --  116*  --   --   --   --  113*  --  117*  CO2 26   < > 28  --  26  --   --  25  --   --   --   --   --   --  24  --  23  GLUCOSE 141*   < > 108*  --  248*  --   --  171*  --  172*  --   --   --   --  217*  --  129*  BUN 28*   < > 23  --  26*  --   --  40*  --  41*  --   --   --   --  39*  --  43*  CREATININE 1.14*   < >  1.00  --  1.18*   < >  --  1.46*  --  1.20*  --   --   --  1.44* 1.38*  --  1.31*  CALCIUM 8.7*   < > 8.9  --  8.6*   < >  --  8.4*  --   --   --   --   --   --  8.3*  --  8.5*  MG 2.3  --   --   --  2.0  --  2.0 2.4  --   --   --   --   --  2.2  --   --   --   PHOS  --   --   --   --  3.6  --  2.4* 3.7  --   --   --   --   --  4.1  --   --   --    < > = values in this interval not displayed.    Liver Function Tests: No results for input(s): AST, ALT, ALKPHOS, BILITOT, PROT, ALBUMIN in the last 168 hours. No results for input(s): LIPASE, AMYLASE in the last 168 hours. No results for input(s): AMMONIA in the last 168 hours.  CBC: Recent Labs  Lab 01/10/2020 1644 01/08/2020 1644 12/21/2019 1819 12/14/19 0416 12/15/19 0003 12/15/19 0250 12/16/19 0400  WBC 14.3*  --  15.2* 17.3*  --  22.4* 23.5*  HGB 6.8*   < > 7.1* 7.8* 8.8* 8.1* 7.5*  HCT 22.7*   < > 23.5* 25.0* 26.0* 25.9* 25.1*  MCV 100.9*  --  100.9* 99.6  --  102.0* 105.9*  PLT 283  --  297 267  --  309 330   < > = values in this interval not displayed.    Cardiac Enzymes: No results for input(s): CKTOTAL, CKMB, CKMBINDEX, TROPONINI in the last 168 hours.  BNP: BNP (last 3 results) Recent Labs    12/08/19 0324 12/09/19 0521 12/10/19 0450  BNP 228.5* 136.4* 611.7*    ProBNP (last 3 results) No results for input(s): PROBNP in the last  8760 hours.    Other results:  Imaging: DG Chest 1 View  Result Date: 12/15/2019 CLINICAL DATA:  ARDS.  Intubation. EXAM: CHEST  1 VIEW COMPARISON:  12/14/2019. FINDINGS: Endotracheal tube, NG tube, left IJ line in stable position. Heart size normal. Diffuse dense bilateral pulmonary infiltrates/edema are again noted, progressed from prior exam. No pleural effusion or pneumothorax. Prior cervical spine fusion. IMPRESSION: 1.  Lines and tubes in stable position. 2. Diffuse dense bilateral pulmonary infiltrates/edema again noted. Interim progression from prior exam. Electronically Signed   By: Marcello Moores  Register   On: 12/15/2019 08:23   DG Chest 1 View  Result Date: 12/14/2019 CLINICAL DATA:  History of respiratory failure with endotracheal tube. EXAM: CHEST  1 VIEW COMPARISON:  12/31/2019 FINDINGS: Endotracheal tube approximately 4 cm above the carina, unchanged accounting for rotation and position. LEFT-sided central venous access device terminates at the caval to atrial junction. Gastric tube courses through in off the field of the radiograph. Tip below the LEFT hemidiaphragm. Cardiomediastinal contours mildly enlarged and accentuated by portable technique. Diffuse interstitial and airspace opacities bilaterally in the chest showing perhaps slight worsening in the RIGHT upper lobe compared to the previous study. LEFT and RIGHT hemidiaphragm remain obscured. The no acute skeletal process. IMPRESSION: 1. Slight worsening of, particularly in the RIGHT upper lobe, of bilateral interstitial and airspace opacities with effusions  suggestive of multifocal pneumonia or edema. Follow-up is suggested to ensure resolution. 2. Unchanged support lines and tubes, tip below the LEFT hemidiaphragm. Electronically Signed   By: Zetta Bills M.D.   On: 12/14/2019 08:38     Medications:     Scheduled Medications: . aspirin  81 mg Per Tube Daily  . chlorhexidine gluconate (MEDLINE KIT)  15 mL Mouth Rinse BID  .  Chlorhexidine Gluconate Cloth  6 each Topical Daily  . clopidogrel  75 mg Per Tube Q breakfast  . enoxaparin (LOVENOX) injection  40 mg Subcutaneous Q24H  . feeding supplement (PROSource TF)  45 mL Per Tube BID  . free water  200 mL Per Tube Q4H  . furosemide  80 mg Intravenous BID  . mouth rinse  15 mL Mouth Rinse 10 times per day  . pantoprazole (PROTONIX) IV  40 mg Intravenous Daily  . rosuvastatin  40 mg Per Tube Daily  . sertraline  50 mg Per Tube Daily  . sodium chloride flush  10 mL Intracatheter Q12H  . sodium chloride flush  3 mL Intravenous Q12H    Infusions: . sodium chloride 10 mL/hr at 12/16/19 0400  . feeding supplement (VITAL AF 1.2 CAL) 1,000 mL (12/16/19 0325)  . fentaNYL infusion INTRAVENOUS 300 mcg/hr (12/16/19 0400)  . insulin 14 mL/hr at 12/16/19 0400  . meropenem (MERREM) IV Stopped (12/15/19 2304)  . norepinephrine (LEVOPHED) Adult infusion Stopped (12/16/19 0329)  . propofol (DIPRIVAN) infusion 30 mcg/kg/min (12/16/19 0325)    PRN Medications: sodium chloride, acetaminophen (TYLENOL) oral liquid 160 mg/5 mL, dextrose, fentaNYL, fentaNYL (SUBLIMAZE) injection, fentaNYL (SUBLIMAZE) injection, ipratropium-albuterol, labetalol, midazolam, ondansetron (ZOFRAN) IV, Resource ThickenUp Clear, sodium chloride flush   Assessment/Plan :   1. PEA Arrest x 2 - suspect due to hypoxia, ? Pulmonary edema vs aspiration  Had previous PEA arrest in 2018 due to pulmonary edema.  - EF 60% -> 45% - NSTEMI this admit, s/p DES to RCA  - remains intubated  2. Acute Hypoxic Respiratory Failure - 8/24 intubated --> extubated 8/26 -->reintubated 8/26 -->extubated 8/29--> reintubated 12/12/19. CXR concerning for ARDs +/- edema - Respiratory Cx: klebsiella --> switched from rocephin to cefepime and now on meropenum - S/P Bronch 8/31 with mucus plug possible food,  BAL growing Enterobacter (sensitive to cefepime/mero) - vent management per PCCM. Am CXR still pending  - Will need  speech consult once extubated.  - Not ready for extubation yet. Needs more diuresis.    3. Shock - suspect mix of sepsis/cardiogenic - co-ox lower off NE yesterday. NE restarted but stopped again overnight - co-ox 87% likely inaccurate. Will repeat.  - needs diuresis/ lasix 80 IV bid  4. A/C systolic/Diastolic HF  -ECHO 0/17/4944 EF 55-60% RV normal -> 12/12/19 EF 45% - Volume status elevated - Continue diuresis   5. NSTEMI  - H/O CAD with S/P PCI Stent to RCA 2002 and distal LAD 2015 - HS Trop 967>5916 - On statin  - S/P LHC with Prox RCA to Mid RCA lesion is 50% stenosed.  Previously placed Mid RCA to Dist RCA stent (unknown type) is widely patent.  Dist RCA lesion is 95% stenosed.  S/P DES to Distal RCA -Dual antiplatelet therapy for at least 12 months. ASA + Plavix -Continue Crestor 40 mg daily. LDL 51 mg/dL   6. DMII - On SSI   7. Anemia  - On 8/26 hgb 11.2.  - Hgb after cath 6.1 ---> received 1UPRBCs --> 8.1 today -> 7.5 -  Transfuse to keep hgb >= 7.5 - No obvious source.  - Continue SCDs for DVT prophylaxis.   CRITICAL CARE Performed by: Glori Bickers  Total critical care time: 35 minutes  Critical care time was exclusive of separately billable procedures and treating other patients.  Critical care was necessary to treat or prevent imminent or life-threatening deterioration.  Critical care was time spent personally by me (independent of midlevel providers or residents) on the following activities: development of treatment plan with patient and/or surrogate as well as nursing, discussions with consultants, evaluation of patient's response to treatment, examination of patient, obtaining history from patient or surrogate, ordering and performing treatments and interventions, ordering and review of laboratory studies, ordering and review of radiographic studies, pulse oximetry and re-evaluation of patient's condition.    Length of Stay: 80  Glori Bickers MD 12/16/2019, 4:42 AM  Advanced Heart Failure Team Pager [phone] (M-F; 7a - 4p)  Please contact [facility] for night-coverage after hours (4p -7a ) and weekends on [email or web address]

## 2019-12-16 NOTE — Progress Notes (Signed)
NAME:  [name] Bean, MRN:  [id number], DOB:  1950/12/24, LOS: 11 ADMISSION DATE:  11/12/2019, CONSULTATION DATE:  12/11/2019 REFERRING MD:  Ralene Bathe - EM , CHIEF COMPLAINT:  Respiratory arrest, hypoxemia   Brief History   69yo female presented to ED after PEA cardiac arrest likely due to respiratory failure.  History of present illness   [name] Bean is a 69 y.o. with PHX significant for HTN, HLD, DM, CHF, cardiac arrest, CAD, and anxiety who presented to ED s/p cardiac arrest. Per report patient called EMS with acute complaints of SOB and on arrival EMS found patient unresponsive in PEA arrest. EMS provided 3 rounds of CPR and obtained ROSC prior to ED arrival. No ACLS medications were given. PCCM called urgently to evaluate pt in ED due to hypoxemia.   CXR with bilateral ASD, concerning for possible ARDS. ABG 7.2/52/48 Labs acquired in ED are hemolyzed, resent but unavailable for review   Past Medical History   Diastolic heart failure HTN DM Prior PEA arrest CAD   Significant Hospital Events   8/24 BIB Ems for CC SOB. On EMS arrival, pulseless and hypoxemic. ROSC achieved. PCCM consulted for hypoxemia shortly after intubation. CVC and Aline placed by PCCM in ED 8/25 [facility] w/ much improved aeration. Able to start weaning PEEP/FIO2 down to 10 from 14 and FIO2 down from 100% to 70%. Transitioning OFF insulin gtt. Transitioning from Fent/propofol to precedex.  8/26: Seen by cardiology on the 25th for non-ST elevation MI.  Started on IV heparin and aspirin on the 25th.  Past spontaneous breathing trial on the morning of 8/26 and was extubated.  Did have some fairly significant hypertension post extubation-->progressed to rapid resp failure and subsequent PEA arrest. Re intubated. Time to ROSC 9 minutes.  8/27: More awake.  Weaning PEEP and FiO2.  Continuing to diurese and maximize hemodynamics not ready for extubation Consults:  CHF  Procedures:  8/24 ETT>8/26>>> 8/24  CVC> 8/24 Aline>  Significant Diagnostic Tests:  8/24 CXR> Diffuse bilateral pulmonary opacities. ETT 2.5cm above carina.  8/24 CTA chest>>1. No CT evidence of pulmonary embolism.2. Small bilateral pleural effusions with findings of pulmonary edema or ARDS.3. Clusters of airspace consolidation primarily involving the left lower lobe as well as upper lobes may represent pneumonia. Clinical correlation recommended.4. Aortic Atherosclerosis (ICD10-I70.0). 8/24 CT H >> negative  ECHO 8/24: Left Ventricle: Endocardial border definition is poor but systolic  function appears to be grossly normal. Cannot rule out mild septal  hypokinesis. Left ventricular ejection fraction, by estimation, is 55 to 60%. The left ventricle has normal function. The left ventricle has no regional wall motion abnormalities. Definity contrast agent was given IV to delineate the left ventricular endocardial borders. The left ventricular internal cavity size was normal in size. There is no left ventricular hypertrophy. Left ventricular diastolic parameters are indeterminate.   9/1 RHC, LHC Findings: Ao = 103/45 (65) LV = 105/12 RA =  8 RV = 36/8 PA = 32/14 (21) PCW = 14 Fick cardiac output/index = 4.7/2.5 PVR = 1.5 WU FA sat = 97%  PA sat = 47%, 49% Assessment: 1. CAD with patent stents in RCA and LAD.  2. 95% stenosis in distal RCA otherwise non-obstructive CAD 3. EF hard to assess ~ 40-45% 4. Normal filling pressures with low MV sat but preserved CO Plan/Discussion: Will plan PCI of distal RCA.    Micro Data:  8/24 SARS Cov2> neg 8/24 RVP >> 8/24 Tracheal aspirate >>klebsiella 8/24 BCx>> 8/28 sputum  klebsiella  8/31 BAL>>  Antimicrobials:  See micro tab   Interim history/subjective:   Remains intubated and sedated.   Objective   Blood pressure (!) 104/51, pulse 85, temperature 97.7 F (36.5 C), temperature source Oral, resp. rate 18, height _0  (1.575 m), weight 88.7 kg, SpO2 100 %. CVP:  [12  mmHg-19 mmHg] 15 mmHg  Vent Mode: PRVC FiO2 (%):  [80 %-90 %] 80 % Set Rate:  [20 bmp] 20 bmp Vt Set:  [400 mL] 400 mL PEEP:  [10 cmH20] 10 cmH20 Plateau Pressure:  [27 cmH20-37 cmH20] 27 cmH20   Intake/Output Summary (Last 24 hours) at 12/16/2019 1615 Last data filed at 12/16/2019 1100 Gross per 24 hour  Intake 2520.76 ml  Output 450 ml  Net 2070.76 ml   Filed Weights   12/14/19 0415 12/15/19 0500 12/16/19 0500  Weight: 84 kg 87.2 kg 88.7 kg    Examination:  GEN: no acute distress on vent HEENT: ETT in place, minimal secretions CV: RRR, ext warm PULM: rhonci bilaterally, no wheezing GI: Soft, +BS EXT: No edema NEURO: more sedated today PSYCH: RASS -4 SKIN: No rashes  WBC up Hgb stable Plts stable TG okay Sodium up  Resolved Hospital Problem list    Assessment & Plan:   Acute diastolic HF w/ Pulmonary edema, with non-ST elevation MI- s/p DES to distal RCA, hopefully this reduces her risk of post-extubation pulmonary edema. - Continue Crestor - Continue to diurese  Hx HTN - Goals SBP < 160, with sedation antihypertensives are on hold  Recurrent Acute respiratory failure with hypoxemia in the setting of diffuse pulmonary edema; has had PEA arrest twice from this. Likely recurrent aspiration as well based on MBSS Klebsiella PNA- treated Now Enterobacter HCAP - Switch cefepime to meropenem - VAP bundle - Check rheum panel although suspect low yield: ESR/CRP elevated, RF mildly elevated; f/u ANA, CCP, ANCA  Fluid and electrolyte imbalance: Intermittent Mild AKI-  Stable - Continue FWF  DM w/ hyperglycemia  - sliding-scale insulin with current Levemir dosing - Blood glucose goal 140-180  Dysphagia- MBSS showed persistent aspiration with thin and nectar thick liquids.  Recommended for honey thickened when able - Re address once off vent   Best practice:  Diet: TF Pain/Anxiety/Delirium protocol (if indicated): propofol/fentanyl VAP protocol (if indicated):  on DVT prophylaxis: lovenox GI prophylaxis: PPI  Glucose control: see above Mobility: BR Code Status: Full  Family Communication: updated at bedside Disposition: ICU  The patient is critically ill with multiple organ systems failure and requires high complexity decision making for assessment and support, frequent evaluation and titration of therapies, application of advanced monitoring technologies and extensive interpretation of multiple databases. Critical Care Time devoted to patient care services described in this note independent of APP/resident time (if applicable)  is 35 minutes.   Kipp Brood, MD  [facility] 12/16/2019 4:15 PM Personal pager: [phone] If unanswered, please page CCM On-call: [phone]

## 2019-12-17 ENCOUNTER — Inpatient Hospital Stay (HOSPITAL_COMMUNITY): Payer: Medicare PPO

## 2019-12-17 LAB — POCT I-STAT 7, (LYTES, BLD GAS, ICA,H+H)
Acid-base deficit: 22 mmol/L — ABNORMAL HIGH (ref 0.0–2.0)
Acid-base deficit: 15 mmol/L — ABNORMAL HIGH (ref 0.0–2.0)
Acid-base deficit: 19 mmol/L — ABNORMAL HIGH (ref 0.0–2.0)
Acid-base deficit: 19 mmol/L — ABNORMAL HIGH (ref 0.0–2.0)
Acid-base deficit: 14 mmol/L — ABNORMAL HIGH (ref 0.0–2.0)
Acid-base deficit: 15 mmol/L — ABNORMAL HIGH (ref 0.0–2.0)
Bicarbonate: 11.2 mmol/L — ABNORMAL LOW (ref 20.0–28.0)
Bicarbonate: 14.8 mmol/L — ABNORMAL LOW (ref 20.0–28.0)
Bicarbonate: 12.4 mmol/L — ABNORMAL LOW (ref 20.0–28.0)
Bicarbonate: 12.6 mmol/L — ABNORMAL LOW (ref 20.0–28.0)
Bicarbonate: 16 mmol/L — ABNORMAL LOW (ref 20.0–28.0)
Bicarbonate: 15.4 mmol/L — ABNORMAL LOW (ref 20.0–28.0)
Calcium, Ion: 1.17 mmol/L (ref 1.15–1.40)
Calcium, Ion: 0.95 mmol/L — ABNORMAL LOW (ref 1.15–1.40)
Calcium, Ion: 0.92 mmol/L — ABNORMAL LOW (ref 1.15–1.40)
Calcium, Ion: 0.93 mmol/L — ABNORMAL LOW (ref 1.15–1.40)
Calcium, Ion: 0.94 mmol/L — ABNORMAL LOW (ref 1.15–1.40)
Calcium, Ion: 0.82 mmol/L — CL (ref 1.15–1.40)
HCT: 24 % — ABNORMAL LOW (ref 36.0–46.0)
HCT: 23 % — ABNORMAL LOW (ref 36.0–46.0)
HCT: 22 % — ABNORMAL LOW (ref 36.0–46.0)
HCT: 23 % — ABNORMAL LOW (ref 36.0–46.0)
HCT: 20 % — ABNORMAL LOW (ref 36.0–46.0)
HCT: 22 % — ABNORMAL LOW (ref 36.0–46.0)
Hemoglobin: 8.2 g/dL — ABNORMAL LOW (ref 12.0–15.0)
Hemoglobin: 7.8 g/dL — ABNORMAL LOW (ref 12.0–15.0)
Hemoglobin: 7.5 g/dL — ABNORMAL LOW (ref 12.0–15.0)
Hemoglobin: 7.8 g/dL — ABNORMAL LOW (ref 12.0–15.0)
Hemoglobin: 6.8 g/dL — CL (ref 12.0–15.0)
Hemoglobin: 7.5 g/dL — ABNORMAL LOW (ref 12.0–15.0)
O2 Saturation: 95 %
O2 Saturation: 90 %
O2 Saturation: 82 %
O2 Saturation: 83 %
O2 Saturation: 93 %
O2 Saturation: 93 %
Patient temperature: 36.9
Patient temperature: 94
Potassium: 6.9 mmol/L (ref 3.5–5.1)
Potassium: 5.2 mmol/L — ABNORMAL HIGH (ref 3.5–5.1)
Potassium: 4.8 mmol/L (ref 3.5–5.1)
Potassium: 4.9 mmol/L (ref 3.5–5.1)
Potassium: 4.2 mmol/L (ref 3.5–5.1)
Potassium: 3.9 mmol/L (ref 3.5–5.1)
Sodium: 143 mmol/L (ref 135–145)
Sodium: 147 mmol/L — ABNORMAL HIGH (ref 135–145)
Sodium: 144 mmol/L (ref 135–145)
Sodium: 144 mmol/L (ref 135–145)
Sodium: 145 mmol/L (ref 135–145)
Sodium: 145 mmol/L (ref 135–145)
TCO2: 13 mmol/L — ABNORMAL LOW (ref 22–32)
TCO2: 16 mmol/L — ABNORMAL LOW (ref 22–32)
TCO2: 14 mmol/L — ABNORMAL LOW (ref 22–32)
TCO2: 15 mmol/L — ABNORMAL LOW (ref 22–32)
TCO2: 18 mmol/L — ABNORMAL LOW (ref 22–32)
TCO2: 17 mmol/L — ABNORMAL LOW (ref 22–32)
pCO2 arterial: 65.8 mmHg (ref 32.0–48.0)
pCO2 arterial: 49.6 mmHg — ABNORMAL HIGH (ref 32.0–48.0)
pCO2 arterial: 64.7 mmHg — ABNORMAL HIGH (ref 32.0–48.0)
pCO2 arterial: 65.7 mmHg (ref 32.0–48.0)
pCO2 arterial: 63.6 mmHg — ABNORMAL HIGH (ref 32.0–48.0)
pCO2 arterial: 64.5 mmHg — ABNORMAL HIGH (ref 32.0–48.0)
pH, Arterial: 6.839 — CL (ref 7.350–7.450)
pH, Arterial: 7.066 — CL (ref 7.350–7.450)
pH, Arterial: 6.889 — CL (ref 7.350–7.450)
pH, Arterial: 6.892 — CL (ref 7.350–7.450)
pH, Arterial: 7.009 — CL (ref 7.350–7.450)
pH, Arterial: 6.986 — CL (ref 7.350–7.450)
pO2, Arterial: 139 mmHg — ABNORMAL HIGH (ref 83.0–108.0)
pO2, Arterial: 71 mmHg — ABNORMAL LOW (ref 83.0–108.0)
pO2, Arterial: 79 mmHg — ABNORMAL LOW (ref 83.0–108.0)
pO2, Arterial: 80 mmHg — ABNORMAL LOW (ref 83.0–108.0)
pO2, Arterial: 102 mmHg (ref 83.0–108.0)
pO2, Arterial: 103 mmHg (ref 83.0–108.0)

## 2019-12-17 LAB — GLUCOSE, CAPILLARY
Glucose-Capillary: 102 mg/dL — ABNORMAL HIGH (ref 70–99)
Glucose-Capillary: 119 mg/dL — ABNORMAL HIGH (ref 70–99)
Glucose-Capillary: 131 mg/dL — ABNORMAL HIGH (ref 70–99)
Glucose-Capillary: 14 mg/dL — CL (ref 70–99)
Glucose-Capillary: 142 mg/dL — ABNORMAL HIGH (ref 70–99)
Glucose-Capillary: 147 mg/dL — ABNORMAL HIGH (ref 70–99)
Glucose-Capillary: 201 mg/dL — ABNORMAL HIGH (ref 70–99)
Glucose-Capillary: 274 mg/dL — ABNORMAL HIGH (ref 70–99)
Glucose-Capillary: 328 mg/dL — ABNORMAL HIGH (ref 70–99)
Glucose-Capillary: 333 mg/dL — ABNORMAL HIGH (ref 70–99)
Glucose-Capillary: 52 mg/dL — ABNORMAL LOW (ref 70–99)
Glucose-Capillary: 52 mg/dL — ABNORMAL LOW (ref 70–99)
Glucose-Capillary: 61 mg/dL — ABNORMAL LOW (ref 70–99)
Glucose-Capillary: 64 mg/dL — ABNORMAL LOW (ref 70–99)
Glucose-Capillary: 79 mg/dL (ref 70–99)
Glucose-Capillary: 83 mg/dL (ref 70–99)
Glucose-Capillary: 86 mg/dL (ref 70–99)

## 2019-12-17 LAB — BASIC METABOLIC PANEL
Anion gap: 16 — ABNORMAL HIGH (ref 5–15)
Anion gap: 17 — ABNORMAL HIGH (ref 5–15)
Anion gap: 19 — ABNORMAL HIGH (ref 5–15)
Anion gap: 20 — ABNORMAL HIGH (ref 5–15)
BUN: 85 mg/dL — ABNORMAL HIGH (ref 8–23)
BUN: 87 mg/dL — ABNORMAL HIGH (ref 8–23)
BUN: 88 mg/dL — ABNORMAL HIGH (ref 8–23)
BUN: 74 mg/dL — ABNORMAL HIGH (ref 8–23)
CO2: 15 mmol/L — ABNORMAL LOW (ref 22–32)
CO2: 14 mmol/L — ABNORMAL LOW (ref 22–32)
CO2: 12 mmol/L — ABNORMAL LOW (ref 22–32)
CO2: 16 mmol/L — ABNORMAL LOW (ref 22–32)
Calcium: 7.7 mg/dL — ABNORMAL LOW (ref 8.9–10.3)
Calcium: 7.6 mg/dL — ABNORMAL LOW (ref 8.9–10.3)
Calcium: 9 mg/dL (ref 8.9–10.3)
Calcium: 7.6 mg/dL — ABNORMAL LOW (ref 8.9–10.3)
Chloride: 110 mmol/L (ref 98–111)
Chloride: 110 mmol/L (ref 98–111)
Chloride: 112 mmol/L — ABNORMAL HIGH (ref 98–111)
Chloride: 112 mmol/L — ABNORMAL HIGH (ref 98–111)
Creatinine, Ser: 4.35 mg/dL — ABNORMAL HIGH (ref 0.44–1.00)
Creatinine, Ser: 4.51 mg/dL — ABNORMAL HIGH (ref 0.44–1.00)
Creatinine, Ser: 4.82 mg/dL — ABNORMAL HIGH (ref 0.44–1.00)
Creatinine, Ser: 4.07 mg/dL — ABNORMAL HIGH (ref 0.44–1.00)
GFR calc Af Amer: 11 mL/min — ABNORMAL LOW (ref >60)
GFR calc Af Amer: 11 mL/min — ABNORMAL LOW (ref >60)
GFR calc Af Amer: 10 mL/min — ABNORMAL LOW (ref >60)
GFR calc Af Amer: 12 mL/min — ABNORMAL LOW (ref >60)
GFR calc non Af Amer: 10 mL/min — ABNORMAL LOW (ref >60)
GFR calc non Af Amer: 9 mL/min — ABNORMAL LOW (ref >60)
GFR calc non Af Amer: 9 mL/min — ABNORMAL LOW (ref >60)
GFR calc non Af Amer: 11 mL/min — ABNORMAL LOW (ref >60)
Glucose, Bld: 421 mg/dL — ABNORMAL HIGH (ref 70–99)
Glucose, Bld: 401 mg/dL — ABNORMAL HIGH (ref 70–99)
Glucose, Bld: 314 mg/dL — ABNORMAL HIGH (ref 70–99)
Glucose, Bld: 68 mg/dL — ABNORMAL LOW (ref 70–99)
Potassium: 6.5 mmol/L (ref 3.5–5.1)
Potassium: 7.2 mmol/L (ref 3.5–5.1)
Potassium: 7.2 mmol/L (ref 3.5–5.1)
Potassium: 4.9 mmol/L (ref 3.5–5.1)
Sodium: 141 mmol/L (ref 135–145)
Sodium: 141 mmol/L (ref 135–145)
Sodium: 143 mmol/L (ref 135–145)
Sodium: 148 mmol/L — ABNORMAL HIGH (ref 135–145)

## 2019-12-17 LAB — POCT I-STAT EG7
Acid-base deficit: 15 mmol/L — ABNORMAL HIGH (ref 0.0–2.0)
Bicarbonate: 16.5 mmol/L — ABNORMAL LOW (ref 20.0–28.0)
Calcium, Ion: 0.83 mmol/L — CL (ref 1.15–1.40)
HCT: 20 % — ABNORMAL LOW (ref 36.0–46.0)
Hemoglobin: 6.8 g/dL — CL (ref 12.0–15.0)
O2 Saturation: 45 %
Potassium: 4 mmol/L (ref 3.5–5.1)
Sodium: 146 mmol/L — ABNORMAL HIGH (ref 135–145)
TCO2: 19 mmol/L — ABNORMAL LOW (ref 22–32)
pCO2, Ven: 75.8 mmHg (ref 44.0–60.0)
pH, Ven: 6.946 — CL (ref 7.250–7.430)
pO2, Ven: 40 mmHg (ref 32.0–45.0)

## 2019-12-17 LAB — CBC
HCT: 25.1 % — ABNORMAL LOW (ref 36.0–46.0)
HCT: 25.9 % — ABNORMAL LOW (ref 36.0–46.0)
HCT: 25.9 % — ABNORMAL LOW (ref 36.0–46.0)
HCT: 22 % — ABNORMAL LOW (ref 36.0–46.0)
Hemoglobin: 7.1 g/dL — ABNORMAL LOW (ref 12.0–15.0)
Hemoglobin: 7.1 g/dL — ABNORMAL LOW (ref 12.0–15.0)
Hemoglobin: 7.2 g/dL — ABNORMAL LOW (ref 12.0–15.0)
Hemoglobin: 6.1 g/dL — CL (ref 12.0–15.0)
MCH: 30.3 pg (ref 26.0–34.0)
MCH: 30.6 pg (ref 26.0–34.0)
MCH: 30.1 pg (ref 26.0–34.0)
MCH: 29.9 pg (ref 26.0–34.0)
MCHC: 28.3 g/dL — ABNORMAL LOW (ref 30.0–36.0)
MCHC: 27.4 g/dL — ABNORMAL LOW (ref 30.0–36.0)
MCHC: 27.8 g/dL — ABNORMAL LOW (ref 30.0–36.0)
MCHC: 27.7 g/dL — ABNORMAL LOW (ref 30.0–36.0)
MCV: 107.3 fL — ABNORMAL HIGH (ref 80.0–100.0)
MCV: 111.6 fL — ABNORMAL HIGH (ref 80.0–100.0)
MCV: 108.4 fL — ABNORMAL HIGH (ref 80.0–100.0)
MCV: 107.8 fL — ABNORMAL HIGH (ref 80.0–100.0)
Platelets: 404 10*3/uL — ABNORMAL HIGH (ref 150–400)
Platelets: 491 10*3/uL — ABNORMAL HIGH (ref 150–400)
Platelets: 420 K/uL — ABNORMAL HIGH (ref 150–400)
Platelets: 264 10*3/uL (ref 150–400)
RBC: 2.34 MIL/uL — ABNORMAL LOW (ref 3.87–5.11)
RBC: 2.32 MIL/uL — ABNORMAL LOW (ref 3.87–5.11)
RBC: 2.39 MIL/uL — ABNORMAL LOW (ref 3.87–5.11)
RBC: 2.04 MIL/uL — ABNORMAL LOW (ref 3.87–5.11)
RDW: 15.7 % — ABNORMAL HIGH (ref 11.5–15.5)
RDW: 15.9 % — ABNORMAL HIGH (ref 11.5–15.5)
RDW: 15.6 % — ABNORMAL HIGH (ref 11.5–15.5)
RDW: 15.3 % (ref 11.5–15.5)
WBC: 27.6 10*3/uL — ABNORMAL HIGH (ref 4.0–10.5)
WBC: 31.4 10*3/uL — ABNORMAL HIGH (ref 4.0–10.5)
WBC: 29.9 K/uL — ABNORMAL HIGH (ref 4.0–10.5)
WBC: 20.4 10*3/uL — ABNORMAL HIGH (ref 4.0–10.5)
nRBC: 2.9 % — ABNORMAL HIGH (ref 0.0–0.2)
nRBC: 7.3 % — ABNORMAL HIGH (ref 0.0–0.2)
nRBC: 9.2 % — ABNORMAL HIGH (ref 0.0–0.2)
nRBC: 11.8 % — ABNORMAL HIGH (ref 0.0–0.2)

## 2019-12-17 LAB — PROTIME-INR
INR: 2.1 — ABNORMAL HIGH (ref 0.8–1.2)
Prothrombin Time: 22.9 seconds — ABNORMAL HIGH (ref 11.4–15.2)

## 2019-12-17 LAB — COOXEMETRY PANEL
Carboxyhemoglobin: 0.8 % (ref 0.5–1.5)
Methemoglobin: 1 % (ref 0.0–1.5)
O2 Saturation: 63.9 %
Total hemoglobin: 7.7 g/dL — ABNORMAL LOW (ref 12.0–16.0)

## 2019-12-17 LAB — COMPREHENSIVE METABOLIC PANEL
ALT: 1392 U/L — ABNORMAL HIGH (ref 0–44)
AST: 3389 U/L — ABNORMAL HIGH (ref 15–41)
Albumin: 1.6 g/dL — ABNORMAL LOW (ref 3.5–5.0)
Alkaline Phosphatase: 343 U/L — ABNORMAL HIGH (ref 38–126)
Anion gap: 25 — ABNORMAL HIGH (ref 5–15)
BUN: 56 mg/dL — ABNORMAL HIGH (ref 8–23)
CO2: 12 mmol/L — ABNORMAL LOW (ref 22–32)
Calcium: 7.2 mg/dL — ABNORMAL LOW (ref 8.9–10.3)
Chloride: 107 mmol/L (ref 98–111)
Creatinine, Ser: 3.35 mg/dL — ABNORMAL HIGH (ref 0.44–1.00)
GFR calc Af Amer: 16 mL/min — ABNORMAL LOW (ref >60)
GFR calc non Af Amer: 14 mL/min — ABNORMAL LOW (ref >60)
Glucose, Bld: 72 mg/dL (ref 70–99)
Potassium: 4.9 mmol/L (ref 3.5–5.1)
Sodium: 144 mmol/L (ref 135–145)
Total Bilirubin: 1.2 mg/dL (ref 0.3–1.2)
Total Protein: 5.9 g/dL — ABNORMAL LOW (ref 6.5–8.1)

## 2019-12-17 LAB — LACTIC ACID, PLASMA: Lactic Acid, Venous: >11 mmol/L (ref 0.5–1.9)

## 2019-12-17 LAB — MAGNESIUM: Magnesium: 2.9 mg/dL — ABNORMAL HIGH (ref 1.7–2.4)

## 2019-12-17 LAB — TRIGLYCERIDES: Triglycerides: 438 mg/dL — ABNORMAL HIGH (ref <150)

## 2019-12-17 MED ORDER — EPINEPHRINE 1 MG/10ML IJ SOSY
PREFILLED_SYRINGE | INTRAMUSCULAR | Status: AC
  Filled 2019-12-17: qty 10

## 2019-12-17 MED ORDER — SODIUM BICARBONATE 8.4 % IV SOLN: 150.0000 meq | Freq: Once | INTRAVENOUS | Status: AC

## 2019-12-17 MED ORDER — EPINEPHRINE HCL 5 MG/250ML IV SOLN IN NS
INTRAVENOUS | Status: AC
  Administered 2019-12-17: 2 ug/min via INTRAVENOUS
  Filled 2019-12-17: qty 250

## 2019-12-17 MED ORDER — NOREPINEPHRINE 16 MG/250ML-% IV SOLN
0.0000 ug/min | INTRAVENOUS | Status: DC
  Administered 2019-12-17: 40 ug/min via INTRAVENOUS
  Administered 2019-12-17: 99.2 ug/min via INTRAVENOUS
  Administered 2019-12-17: 40 ug/min via INTRAVENOUS
  Administered 2019-12-18 (×3): 100 ug/min via INTRAVENOUS
  Filled 2019-12-17: qty 500
  Filled 2019-12-17: qty 250
  Filled 2019-12-17: qty 500
  Filled 2019-12-17 (×3): qty 250

## 2019-12-17 MED ORDER — PRISMASOL BGK 0/2.5 32-2.5 MEQ/L IV SOLN
INTRAVENOUS | Status: DC
  Filled 2019-12-17 (×2): qty 5000

## 2019-12-17 MED ORDER — EPINEPHRINE HCL 5 MG/250ML IV SOLN IN NS
0.5000 ug/min | INTRAVENOUS | Status: DC
  Administered 2019-12-18 (×2): 20 ug/min via INTRAVENOUS
  Filled 2019-12-17 (×2): qty 250

## 2019-12-17 MED ORDER — VECURONIUM BROMIDE 10 MG IV SOLR
INTRAVENOUS | Status: AC
  Filled 2019-12-17: qty 10

## 2019-12-17 MED ORDER — PRISMASOL BGK 0/2.5 32-2.5 MEQ/L IV SOLN
INTRAVENOUS | Status: DC
  Filled 2019-12-17: qty 5000

## 2019-12-17 MED ORDER — EPINEPHRINE 1 MG/10ML IJ SOSY
PREFILLED_SYRINGE | INTRAMUSCULAR | Status: AC
  Filled 2019-12-17: qty 20

## 2019-12-17 MED ORDER — SODIUM BICARBONATE 8.4 % IV SOLN
INTRAVENOUS | Status: AC
  Filled 2019-12-17: qty 50

## 2019-12-17 MED ORDER — DEXTROSE 50 % IV SOLN
25.0000 g | INTRAVENOUS | Status: AC
  Administered 2019-12-17: 25 g via INTRAVENOUS

## 2019-12-17 MED ORDER — CALCIUM GLUCONATE-NACL 1-0.675 GM/50ML-% IV SOLN
1.0000 g | Freq: Once | INTRAVENOUS | Status: AC
  Administered 2019-12-17: 1000 mg via INTRAVENOUS
  Filled 2019-12-17: qty 50

## 2019-12-17 MED ORDER — SODIUM CHLORIDE 0.9 % FOR CRRT: INTRAVENOUS_CENTRAL | Status: DC | PRN

## 2019-12-17 MED ORDER — SODIUM BICARBONATE 8.4 % IV SOLN
100.0000 meq | Freq: Once | INTRAVENOUS | Status: AC
  Filled 2019-12-17: qty 50

## 2019-12-17 MED ORDER — SODIUM BICARBONATE 8.4 % IV SOLN
INTRAVENOUS | Status: AC
  Administered 2019-12-17: 150 meq via INTRAVENOUS
  Filled 2019-12-17: qty 250

## 2019-12-17 MED ORDER — SODIUM BICARBONATE 8.4 % IV SOLN
INTRAVENOUS | Status: AC
  Administered 2019-12-17: 100 meq via INTRAVENOUS
  Filled 2019-12-17: qty 50

## 2019-12-17 MED ORDER — SODIUM BICARBONATE 8.4 % IV SOLN
50.0000 meq | Freq: Once | INTRAVENOUS | Status: AC
  Administered 2019-12-17: 50 meq via INTRAVENOUS
  Filled 2019-12-17: qty 50

## 2019-12-17 MED ORDER — SODIUM BICARBONATE 8.4 % IV SOLN
INTRAVENOUS | Status: AC
  Administered 2019-12-17: 100 meq via INTRAVENOUS
  Filled 2019-12-17: qty 100

## 2019-12-17 MED ORDER — SODIUM BICARBONATE 8.4 % IV SOLN
100.0000 meq | Freq: Once | INTRAVENOUS | Status: AC
  Administered 2019-12-17: 100 meq via INTRAVENOUS

## 2019-12-17 MED ORDER — SODIUM BICARBONATE 8.4 % IV SOLN: 100.0000 meq | Freq: Once | INTRAVENOUS | Status: AC

## 2019-12-17 MED ORDER — PRISMASOL BGK 0/2.5 32-2.5 MEQ/L IV SOLN: INTRAVENOUS | Status: DC

## 2019-12-17 MED ORDER — DEXTROSE 50 % IV SOLN
12.5000 g | INTRAVENOUS | Status: AC
  Administered 2019-12-17: 12.5 g via INTRAVENOUS

## 2019-12-17 MED ORDER — STERILE WATER FOR INJECTION IV SOLN
INTRAVENOUS | Status: DC
  Filled 2019-12-17 (×6): qty 850

## 2019-12-17 MED ORDER — PRISMASOL BGK 4/2.5 32-4-2.5 MEQ/L REPLACEMENT SOLN: Status: DC

## 2019-12-17 MED ORDER — PRISMASOL BGK 0/2.5 32-2.5 MEQ/L IV SOLN
INTRAVENOUS | Status: DC
  Filled 2019-12-17 (×17): qty 5000

## 2019-12-17 MED ORDER — HEPARIN BOLUS VIA INFUSION (CRRT)
1000.0000 [IU] | INTRAVENOUS | Status: DC | PRN
  Filled 2019-12-17: qty 1000

## 2019-12-17 MED ORDER — SODIUM ZIRCONIUM CYCLOSILICATE 10 G PO PACK
10.0000 g | PACK | Freq: Every day | ORAL | Status: DC
  Administered 2019-12-17: 10 g
  Filled 2019-12-17 (×2): qty 1

## 2019-12-17 MED ORDER — SODIUM CHLORIDE 0.9 % IV SOLN
500.0000 [IU]/h | INTRAVENOUS | Status: DC
  Administered 2019-12-17: 500 [IU]/h via INTRAVENOUS_CENTRAL
  Filled 2019-12-17 (×2): qty 2

## 2019-12-17 MED ORDER — VASOPRESSIN 20 UNITS/100 ML INFUSION FOR SHOCK
0.0000 [IU]/min | INTRAVENOUS | Status: DC
  Administered 2019-12-17 (×2): 0.03 [IU]/min via INTRAVENOUS
  Filled 2019-12-17 (×3): qty 100

## 2019-12-17 MED ORDER — SODIUM CHLORIDE 0.9% IV SOLUTION: Freq: Once | INTRAVENOUS | Status: AC

## 2019-12-17 MED ORDER — SODIUM BICARBONATE 8.4 % IV SOLN
50.0000 meq | Freq: Once | INTRAVENOUS | Status: AC
  Administered 2019-12-17: 50 meq via INTRAVENOUS

## 2019-12-17 MED ORDER — SODIUM CHLORIDE 0.9 % IV SOLN
1.0000 g | Freq: Three times a day (TID) | INTRAVENOUS | Status: DC
  Administered 2019-12-17 – 2019-12-18 (×3): 1 g via INTRAVENOUS
  Filled 2019-12-17 (×6): qty 1

## 2019-12-17 MED ORDER — STERILE WATER FOR INJECTION IV SOLN
INTRAVENOUS | Status: DC
  Filled 2019-12-17 (×4): qty 150

## 2019-12-17 MED ORDER — HEPARIN SODIUM (PORCINE) 1000 UNIT/ML DIALYSIS
1000.0000 [IU] | INTRAMUSCULAR | Status: DC | PRN
  Administered 2019-12-17: 2400 [IU] via INTRAVENOUS_CENTRAL
  Administered 2019-12-18: 4000 [IU] via INTRAVENOUS_CENTRAL
  Filled 2019-12-17 (×4): qty 6
  Filled 2019-12-17: qty 2

## 2019-12-17 MED ORDER — DEXTROSE 10 % IV SOLN: INTRAVENOUS | Status: DC

## 2019-12-17 MED ORDER — VECURONIUM BROMIDE 10 MG IV SOLR
10.0000 mg | Freq: Once | INTRAVENOUS | Status: AC
  Administered 2019-12-17: 10 mg via INTRAVENOUS

## 2019-12-17 MED ORDER — INSULIN ASPART 100 UNIT/ML IV SOLN: 5.0000 [IU] | Freq: Once | INTRAVENOUS | Status: DC

## 2019-12-17 MED ORDER — ALTEPLASE 2 MG IJ SOLR: 2.0000 mg | Freq: Once | INTRAMUSCULAR | Status: DC | PRN

## 2019-12-17 MED ORDER — DEXTROSE 50 % IV SOLN
INTRAVENOUS | Status: AC
  Filled 2019-12-17: qty 50

## 2019-12-17 NOTE — Progress Notes (Signed)
CRITICAL VALUE ALERT  Critical Value:  K+ 7.2   Date & Time Notied:  8588 12/17/19  Provider Notified: Agarwala notified  Orders Received/Actions taken: CRRT initiated

## 2019-12-17 NOTE — Progress Notes (Signed)
Patient became asystolic while nurse and Kipp Brood was present in room 0753, chest compressions initiated immediately by bedside nurse. Luberta Mutter took over compressions.    Dan Bensihmon took over chest compressions.  758 1 Epi given, 1 calcium gluconate given.  0800 Sodium Bicarb given and obtained ROSC.   Family at beside. Family given emotional support. Family denied needing Chaplain but did have a cousin come in for support.   [name], RN.

## 2019-12-17 NOTE — CV Procedure (Signed)
Radial arterial line placement.    Emergent consent assumed. The left wrist was prepped and draped in the routine sterile fashion a single lumen radial arterial catheter was placed in the left radial artery using a modified Seldinger technique and u/s guidance. Good blood flow and wave forms. A dressing was placed.     Glori Bickers, MD  12:35 PM

## 2019-12-17 NOTE — Progress Notes (Signed)
[facility] Progress Note Patient Name: [name] Bean DOB: 14-Mar-1951 MRN: [id number]   Date of Service  12/17/2019  HPI/Events of Note  Bedside RN concerns; Metabolic acidosis, anemia, hypoglycemia.  eICU Interventions  Sodium bicarbonate 1 amp iv x 1, stat CBC to confirm ABG hemoglobin result,  D 10 % W infusion for profound hypoglycemia, discuss code status with patient's next of kin.        Kerry Kass Tudor Chandley 12/17/2019, 10:09 PM

## 2019-12-17 NOTE — Progress Notes (Signed)
[facility] Progress Note Patient Name: [name] Bean DOB: Jan 27, 1951 MRN: [id number]   Date of Service  12/17/2019  HPI/Events of Note  K+ 6.5  eICU Interventions  Lokelma 10 gm  Via NG tube x 1, patient is already receiving Insulin for treatment of hyperglycemia.        Kerry Kass [name] 12/17/2019, 5:18 AM

## 2019-12-17 NOTE — Consult Note (Signed)
Renal Service Consult Note [place] 12/17/2019 [place] Requesting Physician:  Dr Carlis Abbott, L.   Reason for Consult:  Renal failure HPI: The patient is a 69 y.o. year-old w/ hx of IDDM, HTN, CAD hx PCI, CHF called EMS on 8/24 for SOB.  Then had PEA cardiac arrest and rec'd 3 rounds of CPR w/ ROSC prior to ED arrival. In ED SpO2 was in the 60's and pt required intubation. CXR showed bilat ASD concerning for ARDS. CTA on 8/24 showed clusters of consolidation along w/ findings of pulm edema vs ARDS (ground-glass, septal thickening). ECHO 8/24 showed LVEF 55-60%, no LVH. RHC/ LHC done on 9/1 showed PCWP of 14, CO/CI 4.7/ 2.5, stents in RCA and LAD were patent, distal TCA was 95% occluded and pt underwent PCI to distal RCA.  Pt had fevers on admission and resp cx's from 8/28 grew Klebs pna rx'd w/ Unasyn.  F/u CXR on 8/31 from resp specimen grew enterobacter and pt rec'd IV cefepime now changed to meropenem IV. Over the last 3 days UOP has dropped, pt has required pressor support and creat has been rising. Today K+ was up to 7.2 and pt had cardiac arrest.  We are asked to see for CRRT.    Pt seen in ICU, family at bedside, no prior hx of kidney disease/ failure.     ROS - n/a   Past Medical History  Past Medical History:  Diagnosis Date  . Anemia   . Anxiety   . Arthritis   . Asthma   . Broken arm    left arm  . Bronchitis   . Bursitis of left hip   . CAD (coronary artery disease)   . Candidiasis, vagina   . Cardiac arrest ([place]) 06/20/2016   at [facility]; ? due to flash pulm edema vs undertreated chronic CHF  . Cerumen impaction   . CHF (congestive heart failure) ([place])    RECENT ADMIT TO [facility]   . Colon, diverticulosis   . Depression   . Diabetes mellitus    15 YRS AGO  . Fatigue   . Gastroenteritis   . Hot flashes, menopausal   . Hyperlipidemia   . Knee pain, left   . Lumbar back pain   . Maxillary sinusitis    history  . Muscle  tear    right gluteus  . Nausea   . Nicotine dependence   . OSA (obstructive sleep apnea) 10/16/2017  . Other B-complex deficiencies   . Otitis media, acute    left  . Peripheral neuropathy   . Spinal stenosis of lumbar region   . Tubulovillous adenoma of colon   . Unspecified essential hypertension    Past Surgical History  Past Surgical History:  Procedure Laterality Date  . CARDIAC CATHETERIZATION  2015, 2009, 2006  . CERVICAL SPINE SURGERY  2003  . CORONARY ANGIOPLASTY WITH STENT PLACEMENT  2002, 2015  . CORONARY STENT INTERVENTION N/A 12/16/2019   Procedure: CORONARY STENT INTERVENTION;  Surgeon: Wellington Hampshire, MD;  Location: [facility];  Service: Cardiovascular;  Laterality: N/A;  . DILATATION & CURETTAGE/HYSTEROSCOPY WITH MYOSURE N/A 10/03/2019   Procedure: DILATATION & CURETTAGE/HYSTEROSCOPY WITH MYOSURE;  Surgeon: [name], MD;  Location: [facility];  Service: Gynecology;  Laterality: N/A;  request 7:30am OR time in [facility] Gynecology requests 30 minutes OR time  . EYE SURGERY     cataracts bilaterally  . POSTERIOR CERVICAL LAMINECTOMY N/A 10/23/2016  Procedure: POSTERIOR CERVICAL LAMINECTOMY MULTI LEVEL CERVICAL TWO- CERVICAL THREE, CERVICAL THREE- CERVICAL FOUR;  Surgeon: Ashok Pall, MD;  Location: [facility];  Service: Neurosurgery;  Laterality: N/A;  POSTERIOR  . RIGHT/LEFT HEART CATH AND CORONARY ANGIOGRAPHY N/A 12/26/2019   Procedure: RIGHT/LEFT HEART CATH AND CORONARY ANGIOGRAPHY;  Surgeon: Jolaine Artist, MD;  Location: [place] [facility];  Service: Cardiovascular;  Laterality: N/A;   Family History  Family History  Problem Relation Age of Onset  . Pneumonia Father        deceased  . Alcohol abuse Father   . Hypertension Mother   . Diabetes Mother    Social History  reports that she has been smoking cigarettes. She has a 11.50 pack-year smoking history. She has never used smokeless tobacco. She reports current alcohol use.  She reports current drug use. Drug: Marijuana. Allergies  Allergies  Allergen Reactions  . Sitagliptin Other (See Comments)    Had pancreatitis while on Januvia    Home medications Prior to Admission medications   Medication Sig Start Date End Date Taking? Authorizing Provider  acetaminophen (TYLENOL) 500 MG tablet Take 2 tablets (1,000 mg total) by mouth every 6 (six) hours as needed for mild pain. 10/03/19   [name], MD  aspirin EC 81 MG tablet Take 81 mg by mouth daily.     [provider]  BD PEN NEEDLE NANO U/F 32G X 4 MM MISC USE TO INJECT INSULIN 5 TIMES A DAY 03/15/13   Yoo, Doe-Hyun R, DO  bimatoprost (LUMIGAN) 0.01 % SOLN Lumigan 0.01 % eye drops  PUT 1 DROP INTO BOTH EYES AT BEDTIME    [provider]  Blood Glucose Monitoring Suppl (ACCU-CHEK COMPACT CARE KIT) KIT Accu-Chek Compact Plus Care kit  USE AS INSTRUCTED.    [provider]  carvedilol (COREG) 25 MG tablet Take 1 tablet (25 mg total) by mouth in the morning and at bedtime. 07/31/19   Bensimhon, Shaune Pascal, MD  glucose blood (FREESTYLE TEST STRIPS) test strip 1 each by Other route daily. (accu-chek guide) DX E11.9 08/06/17   Billie Ruddy, MD  hydrALAZINE (APRESOLINE) 50 MG tablet Take 1 tablet (50 mg total) by mouth in the morning and at bedtime. 07/31/19   Bensimhon, Shaune Pascal, MD  ibuprofen (MOTRIN IB) 200 MG tablet Take 3 tablets (600 mg total) by mouth every 6 (six) hours as needed for mild pain or cramping. 10/03/19   [name], MD  insulin aspart (NOVOLOG) 100 UNIT/ML FlexPen Inject 12 units 3 times daily with meals Patient taking differently: Inject 12 Units into the skin 3 (three) times daily with meals.  05/24/19   Billie Ruddy, MD  LANTUS SOLOSTAR 100 UNIT/ML Solostar Pen INJECT 38 UNITS INTO THE SKIN AT BEDTIME. Patient taking differently: Inject 30 Units into the skin at bedtime.  08/16/19   Billie Ruddy, MD  latanoprost (XALATAN) 0.005 % ophthalmic solution Place 1  drop into both eyes at bedtime.    [provider]  LORazepam (ATIVAN) 0.5 MG tablet Take one tab 30 mins prior to dental procedure. 01/09/19   Billie Ruddy, MD  losartan (COZAAR) 100 MG tablet TAKE 1 TABLET BY MOUTH EVERY DAY Patient taking differently: Take 100 mg by mouth daily.  07/03/19   Billie Ruddy, MD  rosuvastatin (CRESTOR) 20 MG tablet TAKE 1 TABLET BY MOUTH EVERY DAY Patient taking differently: Take 20 mg by mouth daily.  04/03/19   Billie Ruddy, MD  sertraline (ZOLOFT) 50 MG tablet TAKE 1 TABLET BY MOUTH EVERY DAY Patient taking differently: Take 50 mg by mouth daily.  07/03/19   Billie Ruddy, MD  tiZANidine (ZANAFLEX) 2 MG tablet Take by mouth every 6 (six) hours as needed for muscle spasms.    [provider]  torsemide (DEMADEX) 20 MG tablet TAKE 1 TO 2 TABLETS DAILY AS NEEDED FOR SWELLING Patient taking differently: Take 20-40 mg by mouth daily as needed (swelling).  03/29/19   Billie Ruddy, MD  varenicline (CHANTIX CONTINUING MONTH PAK) 1 MG tablet Take 1 tablet (1 mg total) by mouth 2 (two) times daily. 07/03/19   Billie Ruddy, MD     Vitals:   12/17/19 0645 12/17/19 0700 12/17/19 0727 12/17/19 0825  BP: (!) 114/46 (!) 110/43    Pulse:      Resp: 19 18    Temp:   97.9 F (36.6 C)   TempSrc:   Oral   SpO2:    99%  Weight:      Height:       Exam Gen on vent, sedated, on pressor support No rash, cyanosis or gangrene Sclera anicteric, throat w ETT  +JVD Chest coarse rales/ rhonchi RRR no MRG Abd soft ntnd no mass or ascites +bs obese GU foley draining very small amts of dark yellow urine MS no joint effusions or deformity Ext diffuse 2-3+ pitting UE/ LE edema Neuro is sedated o nthe vent    Home meds:  - asa 81/ coreg 25 bid/ hydralazine 50 bid/ losartan 100 qd/ crestor 20/ torsemide 20-$RemoveBeforeDE'40mg'egRqmdcsuhIcCKH$  qd prn  - zoloft 50 qd/ chantix $RemoveBef'1mg'ORdfsGNfpw$  bid/ zanaflex prn  - insulin aspart 12u tid/ lantus 38h hs  - prn's/ vitamins/  supplements     UA 8/24 - negative, prot 100, +glu    UA today - pending    UNa, Cr pending    Renal US - pending    CXR 9/4 - IMPRESSION: 1. Improving bilateral lung aeration with decreasing perihilar opacities, improving edema or pneumonia. Persistent bilateral lung opacities in a basilar predominant distribution.     Admit wt 95 or 90kg , peak wt 92.2 kg, today's wt 92kg     Total I/O's  26 L in and 26 L out = even     WBC rising last 6 days 12 K >> 31K today     Baseline creat 8/24 0.96     Today lab > Na 143  K 7.2  CO2 12  BUN 88  Cr 4.82  Ca 9.0  AG 19      Wbc 31k hb 7.1 plt 491     Assessment/ Plan: 1. Renal failure/ AKI -due to shock most likely sepsis, requiring pressor support.  High K+ and arrest this am.  Plan CRRT to start asap this am.   2. Vol excess - sig edema on exam, high CVP's, will UF 100- 125 cc/ hr as tolerated.  3. Resp failure - ARDS/ pneumonia x 2 here (Klebsiella then enterobacter), w/ possibility of vol overload as well. CXR's have all been bad since admission.  4. DM2 - on insulin drip 5. CAD hx prior RCA/ LAD stents - new PCI here to distal RCA lesion      Kelly Splinter  MD 12/17/2019, 10:49 AM  Recent Labs  Lab 12/17/19 0332 12/17/19 0842  WBC 27.6* 31.4*  HGB 7.1* 7.1*   Recent Labs  Lab 01/03/2020 0358 12/24/2019 1442 12/17/2019 1644 12/14/19 0416  12/17/19 0553 12/17/19 0842  K 3.5   < >  --    < > 7.2* 7.2*  BUN 40*   < >  --    < > 87* 88*  CREATININE 1.46*   < > 1.44*   < > 4.51* 4.82*  CALCIUM 8.4*  --   --    < > 7.6* 9.0  PHOS 3.7  --  4.1  --   --   --    < > = values in this interval not displayed.

## 2019-12-17 NOTE — Progress Notes (Signed)
NAME:  [name] Bean, MRN:  [id number], DOB:  12/02/1950, LOS: 12 ADMISSION DATE:  11/30/2019, CONSULTATION DATE:  11/13/2019 REFERRING MD:  Ralene Bathe - EM , CHIEF COMPLAINT:  Respiratory arrest, hypoxemia   Brief History   69yo female presented to ED after PEA cardiac arrest likely due to respiratory failure.  History of present illness   [name] Bean is a 69 y.o. with PHX significant for HTN, HLD, DM, CHF, cardiac arrest, CAD, and anxiety who presented to ED s/p cardiac arrest. Per report patient called EMS with acute complaints of SOB and on arrival EMS found patient unresponsive in PEA arrest. EMS provided 3 rounds of CPR and obtained ROSC prior to ED arrival. No ACLS medications were given. PCCM called urgently to evaluate pt in ED due to hypoxemia.   CXR with bilateral ASD, concerning for possible ARDS. ABG 7.2/52/48 Labs acquired in ED are hemolyzed, resent but unavailable for review   Past Medical History   Diastolic heart failure HTN DM Prior PEA arrest CAD   Significant Hospital Events   8/24 BIB [facility] for CC SOB. On EMS arrival, pulseless and hypoxemic. ROSC achieved. PCCM consulted for hypoxemia shortly after intubation. CVC and Aline placed by PCCM in ED 8/25 [facility] w/ much improved aeration. Able to start weaning PEEP/FIO2 down to 10 from 14 and FIO2 down from 100% to 70%. Transitioning OFF insulin gtt. Transitioning from Fent/propofol to precedex.  8/26: Seen by cardiology on the 25th for non-ST elevation MI.  Started on IV heparin and aspirin on the 25th.  Past spontaneous breathing trial on the morning of 8/26 and was extubated.  Did have some fairly significant hypertension post extubation-->progressed to rapid resp failure and subsequent PEA arrest. Re intubated. Time to ROSC 9 minutes.  8/27: More awake.  Weaning PEEP and FiO2.  Continuing to diurese and maximize hemodynamics not ready for extubation Consults:  CHF  Procedures:  8/24 ETT>8/26>>> 8/24  CVC> 8/24 Aline>  Significant Diagnostic Tests:  8/24 CXR> Diffuse bilateral pulmonary opacities. ETT 2.5cm above carina.  8/24 CTA chest>>1. No CT evidence of pulmonary embolism.2. Small bilateral pleural effusions with findings of pulmonary edema or ARDS.3. Clusters of airspace consolidation primarily involving the left lower lobe as well as upper lobes may represent pneumonia. Clinical correlation recommended.4. Aortic Atherosclerosis (ICD10-I70.0). 8/24 CT H >> negative  ECHO 8/24: Left Ventricle: Endocardial border definition is poor but systolic  function appears to be grossly normal. Cannot rule out mild septal  hypokinesis. Left ventricular ejection fraction, by estimation, is 55 to 60%. The left ventricle has normal function. The left ventricle has no regional wall motion abnormalities. Definity contrast agent was given IV to delineate the left ventricular endocardial borders. The left ventricular internal cavity size was normal in size. There is no left ventricular hypertrophy. Left ventricular diastolic parameters are indeterminate.   9/1 RHC, LHC 1. CAD with patent stents in RCA and LAD.  2. 95% stenosis in distal RCA otherwise non-obstructive CAD 3. EF hard to assess ~ 40-45% 4. Normal filling pressures with low MV sat but preserved CO S/P DES to distal RCA   Micro Data:  8/24 SARS Cov2> neg 8/24 RVP >> 8/24 Tracheal aspirate >>klebsiella 8/24 BCx>> 8/28 sputum klebsiella  8/31 BAL>>  Antimicrobials:  See micro tab   Interim history/subjective:   Worsening renal failure over past 24h with no response to diuretics. Developed severe hyperkalemia and acidosis leading abrupt asystolic cardiac arrest. ROSC after 78mn, given EPI, bicarbonate, calcium.  Objective   Blood pressure 95/82, pulse (!) 137, temperature 98.1 F (36.7 C), temperature source Oral, resp. rate (!) 25, height _0  (1.575 m), weight 92.2 kg, SpO2 92 %. CVP:  [16 mmHg-21 mmHg] 17 mmHg  Vent Mode:  PRVC FiO2 (%):  [40 %-100 %] 90 % Set Rate:  [20 bmp-25 bmp] 25 bmp Vt Set:  [400 mL] 400 mL PEEP:  [10 cmH20] 10 cmH20 Plateau Pressure:  [14 cmH20-37 cmH20] 37 cmH20   Intake/Output Summary (Last 24 hours) at 12/17/2019 1500 Last data filed at 12/17/2019 1446 Gross per 24 hour  Intake 4357.96 ml  Output 18 ml  Net 4339.96 ml   Filed Weights   12/15/19 0500 12/16/19 0500 12/17/19 0500  Weight: 87.2 kg 88.7 kg 92.2 kg    Examination:  GEN: no acute distress on vent HEENT: ETT in place, minimal secretions CV: RRR, ext warm PULM: rhonci bilaterally, no wheezing GI: Soft, +BS EXT: No edema NEURO: no response to pain immediately following arrest.  PSYCH: RASS -5 SKIN: No rashes   Resolved Hospital Problem list    Assessment & Plan:   Critically ill due to PEA cardiac arrest due hyperkalemia and acidosis.  - Emergent initiation of CRRT.  - Continue temporizing measures as needed for hemodynamics  Critically ill due to cardiogenic and distributive shock due to hyperkalemia and acidosis.  - Continue vasopressors to maintain MAP >65  - Vasopressor requirements should improve as renal failure corrects with CRRT  AKI with anuria. - Initiate urgent CRRT  Acute diastolic HF w/ Pulmonary edema, with non-ST elevation MI- s/p DES to distal RCA, hopefully this reduces her risk of post-extubation pulmonary edema. - Continue Crestor - Continue to diurese  Hx HTN - Goals SBP < 160, with sedation antihypertensives are on hold  Recurrent Acute respiratory failure with hypoxemia in the setting of diffuse pulmonary edema; has had PEA arrest twice from this. Likely recurrent aspiration as well based on MBSS Klebsiella PNA- treated Now Enterobacter HCAP - Switch cefepime to meropenem - VAP bundle  DM w/ hyperglycemia  - sliding-scale insulin with current Levemir dosing - Blood glucose goal 140-180  Dysphagia- MBSS showed persistent aspiration with thin and nectar thick liquids.   Recommended for honey thickened when able - Re address once off vent   Best practice:  Diet: TF Pain/Anxiety/Delirium protocol (if indicated): propofol/fentanyl VAP protocol (if indicated): on DVT prophylaxis: lovenox GI prophylaxis: PPI  Glucose control: see above Mobility: BR Code Status: Full  Family Communication: updated at bedside Disposition: ICU  The patient is critically ill with multiple organ systems failure and requires high complexity decision making for assessment and support, frequent evaluation and titration of therapies, application of advanced monitoring technologies and extensive interpretation of multiple databases. Critical Care Time devoted to patient care services described in this note independent of APP/resident time (if applicable)  is 50 minutes.   Kipp Brood, MD  [facility] 12/17/2019 3:00 PM Personal pager: [phone] If unanswered, please page CCM On-call: [phone]

## 2019-12-17 NOTE — Progress Notes (Addendum)
Called about persistent severe metabolic acidosis w/ ABG pH 6.8- 7.0.  Will change pre/ post fluids to sodium bicarb and ^ dialysate flow rate to 2500 cc/hr.  She is already getting a systemic bicarb gtt at 150 cc/hr. Will not be able to pull fluid w/ worsening shock, keep even for now.  Cont 0K+ dialysate for now, K+ is in the high 4's now.   Kelly Splinter, MD 12/17/2019, 8:46 PM

## 2019-12-17 NOTE — Progress Notes (Addendum)
Patient in consistent afib with HR 90-120's. Dr. Lynetta Mare notified and arrived bedside. Will continue CCRT and hold current pressor rates: norepinephrine 100 mcg and vasopressin 0.03.

## 2019-12-17 NOTE — Progress Notes (Signed)
Hypoglycemic Event  CBG: 61  Treatment: 50% Dextrose 12.5g   Symptoms: Unknown, Hemodynamically unstable   Follow-up CBG: Time:0027 CBG Result 107  Possible Reasons for Event: Hemodynamically unstable, metabolic acidosis. Comments/MD notified Dr. Baldemar Friday

## 2019-12-17 NOTE — Procedures (Signed)
Central Venous Catheter Insertion Procedure Note  [name] Bean  [id number]  [birth year]  Date:12/17/19  Time:3:11 PM   Provider Performing:Cardin Nitschke   Procedure: Insertion of Non-tunneled Central Venous Catheter(36556)with US guidance (39432)    Indication(s) Hemodialysis  Consent Unable to obtain consent due to emergent nature of procedure.  Anesthesia Topical only with 1% lidocaine   Timeout Verified patient identification, verified procedure, site/side was marked, verified correct patient position, special equipment/implants available, medications/allergies/relevant history reviewed, required imaging and test results available.  Sterile Technique Maximal sterile technique including full sterile barrier drape, hand hygiene, sterile gown, sterile gloves, mask, hair covering, sterile ultrasound probe cover (if used).  Procedure Description Area of catheter insertion was cleaned with chlorhexidine and draped in sterile fashion.   With real-time ultrasound guidance a HD catheter was placed into the right femoral vein.  Nonpulsatile blood flow and easy flushing noted in all ports.  The catheter was sutured in place and sterile dressing applied.  Complications/Tolerance None; patient tolerated the procedure well. Chest X-ray is ordered to verify placement for internal jugular or subclavian cannulation.  Chest x-ray is not ordered for femoral cannulation.  EBL Minimal  Specimen(s) None  Kipp Brood, MD [facility] ICU Physician [place]  Pager: [phone] Mobile: [phone] After hours: [phone].  12/17/2019, 3:12 PM

## 2019-12-17 NOTE — Progress Notes (Addendum)
Advanced Heart Failure Rounding Note   Subjective:    Had asystolic cardiac arrest this am in the setting of ARF and hyperkalemia (7.2). Dr. Lynetta Mare and I at bedside  ROSC after < 5 mins of CPR. Given epi, bicarb and calcium. HD cath and arterial line placed emergently.   On NE 50. VP started  EF 45% on echo   Prox RCA to Mid RCA lesion is 50% stenosed.  Previously placed Mid RCA to Dist RCA stent (unknown type) is widely patent.  Dist RCA lesion is 95% stenosed. S/P DES to Dist RCA. RA 8 PCW 14  CO 4.7 CI 2.5     Objective:   Weight Range:  Vital Signs:   Temp:  [96.4 F (35.8 C)-98 F (36.7 C)] 97.9 F (36.6 C) (09/05 0727) Pulse Rate:  [58-88] 76 (09/05 0430) Resp:  [13-22] 18 (09/05 0700) BP: (99-140)/(32-88) 110/43 (09/05 0700) SpO2:  [96 %-100 %] 99 % (09/05 0825) FiO2 (%):  [40 %-100 %] 100 % (09/05 0825) Weight:  [92.2 kg] 92.2 kg (09/05 0500) Last BM Date: 12/16/19  Weight change: Filed Weights   12/15/19 0500 12/16/19 0500 12/17/19 0500  Weight: 87.2 kg 88.7 kg 92.2 kg    Intake/Output:   Intake/Output Summary (Last 24 hours) at 12/17/2019 0901 Last data filed at 12/17/2019 0700 Gross per 24 hour  Intake 2948.34 ml  Output 18 ml  Net 2930.34 ml     Physical Exam: General:  Intubated and sedated  HEENT: normal + ETT Neck: supple. JVP to jaw Carotids 2+ bilat; no bruits. No lymphadenopathy or thryomegaly appreciated. Cor: PMI nondisplaced. Irregular tachy Lungs: diffuse crackles Abdomen: obese soft, nontender, nondistended. No hepatosplenomegaly. No bruits or masses. Good bowel sounds. Extremities: no cyanosis, clubbing, rash, 1+ edema warm Neuro: intubated unresponsive   Telemetry: NSR 80s Personally reviewed    Labs: Basic Metabolic Panel: Recent Labs  Lab 12/11/19 0353 12/12/19 0520 12/12/19 1237 12/12/19 1237 12/12/19 2154 12/22/2019 0358 12/30/2019 1442 01/10/2020 1644 12/14/19 0416 12/14/19 0416 12/15/19 0003  12/15/19 0250 12/15/19 0250 12/16/19 0400 12/17/19 0332 12/17/19 0553  NA 147*   < > 148*   < >  --  149*   < >  --  148*   < > 150* 148*  --  145 141 141  K 3.0*   < > 4.2   < >  --  3.5   < >  --  3.2*   < > 3.4* 3.8  --  5.0 6.5* 7.2*  CL 107   < > 108   < >  --  112*   < >  --  113*  --   --  117*  --  114* 110 110  CO2 26   < > 26   < >  --  25  --   --  24  --   --  23  --  19* 15* 14*  GLUCOSE 141*   < > 248*   < >  --  171*   < >  --  217*  --   --  129*  --  288* 421* 401*  BUN 28*   < > 26*   < >  --  40*   < >  --  39*  --   --  43*  --  68* 85* 87*  CREATININE 1.14*   < > 1.18*   < >  --  1.46*   < >  1.44* 1.38*  --   --  1.31*  --  2.96* 4.35* 4.51*  CALCIUM 8.7*   < > 8.6*   < >  --  8.4*  --   --  8.3*   < >  --  8.5*   < > 7.9* 7.7* 7.6*  MG 2.3  --  2.0  --  2.0 2.4  --  2.2  --   --   --   --   --   --   --   --   PHOS  --   --  3.6  --  2.4* 3.7  --  4.1  --   --   --   --   --   --   --   --    < > = values in this interval not displayed.    Liver Function Tests: No results for input(s): AST, ALT, ALKPHOS, BILITOT, PROT, ALBUMIN in the last 168 hours. No results for input(s): LIPASE, AMYLASE in the last 168 hours. No results for input(s): AMMONIA in the last 168 hours.  CBC: Recent Labs  Lab 01/05/2020 1819 12/28/2019 1819 12/14/19 0416 12/15/19 0003 12/15/19 0250 12/16/19 0400 12/17/19 0332  WBC 15.2*  --  17.3*  --  22.4* 23.5* 27.6*  HGB 7.1*   < > 7.8* 8.8* 8.1* 7.5* 7.1*  HCT 23.5*   < > 25.0* 26.0* 25.9* 25.1* 25.1*  MCV 100.9*  --  99.6  --  102.0* 105.9* 107.3*  PLT 297  --  267  --  309 330 404*   < > = values in this interval not displayed.    Cardiac Enzymes: No results for input(s): CKTOTAL, CKMB, CKMBINDEX, TROPONINI in the last 168 hours.  BNP: BNP (last 3 results) Recent Labs    12/08/19 0324 12/09/19 0521 12/10/19 0450  BNP 228.5* 136.4* 611.7*    ProBNP (last 3 results) No results for input(s): PROBNP in the last 8760  hours.    Other results:  Imaging: DG CHEST PORT 1 VIEW  Result Date: 12/16/2019 CLINICAL DATA:  Respiratory failure with hypoxia. EXAM: PORTABLE CHEST 1 VIEW COMPARISON:  Radiograph yesterday. FINDINGS: Endotracheal tube tip at the thoracic inlet. Enteric tube in place with tip below the diaphragm. Left central line unchanged tip in the SVC. Improving bilateral lung aeration with decreasing perihilar opacities. Persistent bilateral lung opacities in a basilar predominant distribution. Cardiomegaly with unchanged mediastinal contours. Aortic atherosclerosis. No pneumothorax or evidence of pneumomediastinum. Stable osseous structures. IMPRESSION: 1. Improving bilateral lung aeration with decreasing perihilar opacities, improving edema or pneumonia. Persistent bilateral lung opacities in a basilar predominant distribution. 2. Stable support apparatus. Electronically Signed   By: Keith Rake M.D.   On: 12/16/2019 19:24     Medications:     Scheduled Medications: . aspirin  81 mg Per Tube Daily  . chlorhexidine gluconate (MEDLINE KIT)  15 mL Mouth Rinse BID  . Chlorhexidine Gluconate Cloth  6 each Topical Daily  . clopidogrel  75 mg Per Tube Q breakfast  . enoxaparin (LOVENOX) injection  30 mg Subcutaneous Q24H  . feeding supplement (PROSource TF)  45 mL Per Tube BID  . free water  200 mL Per Tube Q4H  . insulin aspart  5 Units Intravenous Once  . mouth rinse  15 mL Mouth Rinse 10 times per day  . pantoprazole (PROTONIX) IV  40 mg Intravenous Daily  . rosuvastatin  40 mg Per Tube Daily  . sertraline  50  mg Per Tube Daily  . sodium chloride flush  10 mL Intracatheter Q12H  . sodium chloride flush  3 mL Intravenous Q12H  . sodium zirconium cyclosilicate  10 g Per Tube Daily  . vecuronium        Infusions: . sodium chloride 10 mL/hr at 12/17/19 0700  . feeding supplement (VITAL AF 1.2 CAL) 1,000 mL (12/17/19 0013)  . fentaNYL infusion INTRAVENOUS 200 mcg/hr (12/17/19 0700)  .  furosemide (LASIX) infusion 15 mg/hr (12/17/19 0700)  . heparin 10,000 units/ 20 mL infusion syringe    . insulin Stopped (12/16/19 1132)  . meropenem (MERREM) IV Stopped (12/16/19 2227)  . norepinephrine (LEVOPHED) Adult infusion 20 mcg/min (12/17/19 0740)  . prismasol BGK 0/2.5    . prismasol BGK 2/2.5 replacement solution    . prismasol BGK 2/2.5 replacement solution    . propofol (DIPRIVAN) infusion 30 mcg/kg/min (12/17/19 0508)  . vasopressin 0.03 Units/min (12/17/19 0856)    PRN Medications: sodium chloride, acetaminophen (TYLENOL) oral liquid 160 mg/5 mL, alteplase, dextrose, fentaNYL, fentaNYL (SUBLIMAZE) injection, fentaNYL (SUBLIMAZE) injection, heparin, heparin, ipratropium-albuterol, labetalol, midazolam, ondansetron (ZOFRAN) IV, Resource ThickenUp Clear, sodium chloride, sodium chloride flush   Assessment/Plan :   1. PEA Arrest x 2, Asystolic arrest x 1 (08/14/25) - suspect due to hypoxia, ? Pulmonary edema vs aspiration  Had previous PEA arrest in 2018 due to pulmonary edema.  - asystolic arrest 9/5 due to hyperkalemiaacidosis - remains on high-dose pressors, intubated - will need emergent CVVHD (line placed). Renal aware  2. Acute Hypoxic Respiratory Failure - 8/24 intubated --> extubated 8/26 -->reintubated 8/26 -->extubated 8/29--> reintubated 12/12/19. CXR concerning for ARDs +/- edema - Respiratory Cx: klebsiella --> switched from rocephin to cefepime and now on meropenum - S/P Bronch 8/31 with mucus plug possible food,  BAL growing Enterobacter (sensitive to cefepime/mero) - s/p asystolic arrest this am remains markedly volume overloaded in setting of AKI - start CVVHD - CCM managing vent  3. Shock - suspect mix of sepsis/cardiogenic - WBC 31k today. Now on meropenem - co-ox 64% today on NE - continue pressor support.   4. A/C systolic/Diastolic HF  -ECHO 0/62/3762 EF 55-60% RV normal -> 12/12/19 EF 45% - Volume status elevated in setting of AKI - Volume  removal with CVHD   5. NSTEMI  - H/O CAD with S/P PCI Stent to RCA 2002 and distal LAD 2015 - HS Trop 831>5176 - On statin  - S/P LHC with Prox RCA to Mid RCA lesion is 50% stenosed.  Previously placed Mid RCA to Dist RCA stent (unknown type) is widely patent.  Dist RCA lesion is 95% stenosed.  S/P DES to Distal RCA -Dual antiplatelet therapy for at least 12 months. ASA + Plavix -Continue Crestor 40 mg daily. LDL 51 mg/dL   6. AKI - suspect combination of ATN and CIN - start CVVHD today  Renal aware  7 DMII - On SSI   8. Anemia  - On 8/26 hgb 11.2.  - Has received several units RBCs. - Hgb 7.1 will need another unit. Can give with CVVHD - Transfuse to keep hgb >= 7.5 - No obvious source.  - Continue SCDs for DVT prophylaxis.   9. Hyperkalemia - K 7.2 in setting of AKI - start CVVHD  CRITICAL CARE Performed by: Glori Bickers  Total critical care time: 60 minutes  Critical care time was exclusive of separately billable procedures and treating other patients.  Critical care was necessary to treat or prevent imminent or  life-threatening deterioration.  Critical care was time spent personally by me (independent of midlevel providers or residents) on the following activities: development of treatment plan with patient and/or surrogate as well as nursing, discussions with consultants, evaluation of patient's response to treatment, examination of patient, obtaining history from patient or surrogate, ordering and performing treatments and interventions, ordering and review of laboratory studies, ordering and review of radiographic studies, pulse oximetry and re-evaluation of patient's condition.    Length of Stay: 12  Glori Bickers MD 12/17/2019, 9:01 AM  Advanced Heart Failure Team Pager [phone] (M-F; [place])  Please contact [facility] for night-coverage after hours (4p -7a ) and weekends on [email or web address]

## 2019-12-17 NOTE — Progress Notes (Signed)
Hypoglycemic Event  CBG: 14  Treatment: 50% Dextrose 25g per 62ml  Symptoms: Unknown, Hemodynamically unstable   Follow-up CBG: Time:2200 CBG Result:102  Possible Reasons for Event: Hemodynamically unstable, metabolic acidosis  Comments/MD notified: Dr. Lucile Shutters (CCM). New order for Dextrose 10 infusion    Jawara Latorre K Abriana Saltos

## 2019-12-17 NOTE — Progress Notes (Signed)
[facility] Progress Note Patient Name: [name] Bean DOB: 04-22-1950 MRN: [id number]   Date of Service  12/17/2019  HPI/Events of Note  Patient with severe shock, multiple organ systems failure and refractory acidosis and hypotension.  eICU Interventions  Sodium bicarbonate  3 amps iv stat, transfuse 2 units PRBC, I discussed patient's clinical status with her sister [name] who elected to make her DNR, she is on her way in.        Kerry Kass Marshun Duva 12/17/2019, 11:24 PM

## 2019-12-17 NOTE — Progress Notes (Signed)
°   12/17/19 0800  Clinical Encounter Type  Visited With Patient  Visit Type Spiritual support  Referral From Nurse  Consult/Referral To Chaplain  Spiritual Encounters  Spiritual Needs Emotional  Stress Factors  Patient Stress Factors Health changes  Family Stress Factors None identified  Chaplain responded to Code Blue. Patient was stablized. Chaplain will return to talk with family.

## 2019-12-17 NOTE — Progress Notes (Signed)
Pharmacy Antibiotic Note  [name] Bean is a 69 y.o. female admitted on 11/22/2019 with pneumonia. TA with Kleb R to amp S to ancef. Most recent BAL with enterobacter. Patient was switched to meropenem on 9/3.   Renal function worsening significantly with Scr up to 4.51 from 2.96 yesterday, minimal urine output yesterday. Starting CRRT today. Remains afebrile. WBC up to 27.6 today.  Plan: Adjust meropenem to 1g IV q8 hrs for CRRT dosing Monitor clinical progression, CRRT tolerability/duration  Height: _0  (157.5 cm) Weight: 92.2 kg (203 lb 4.2 oz) IBW/kg (Calculated) : 50.1  Temp (24hrs), Avg:97.6 F (36.4 C), Min:96.4 F (35.8 C), Max:98 F (36.7 C)  Recent Labs  Lab 12/25/2019 1644 12/28/2019 1819 12/14/19 0416 12/15/19 0250 12/16/19 0400 12/17/19 0332 12/17/19 0553  WBC  --  15.2* 17.3* 22.4* 23.5* 27.6*  --   CREATININE   < >  --  1.38* 1.31* 2.96* 4.35* 4.51*   < > = values in this interval not displayed.    Estimated Creatinine Clearance: 12.8 mL/min (A) (by C-G formula based on SCr of 4.51 mg/dL (H)).    Allergies  Allergen Reactions  . Sitagliptin Other (See Comments)    Had pancreatitis while on Januvia     Antimicrobials this admission: Ceftriaxone 8/30 >> 8/31 Unasyn 8/24 >> 8/29 Azithro 8/24 >> 8/24 Cefepime 8/31>9/3 Meropenem 9/3>>  Dose adjustments this admission: 9/4: CrCl 19 - Meropenem 2g to 1g Q12h 9/5: starting CRRT - [place]  Microbiology results: 8/24 BCx: ngtd 8/28 Resp Cx: klebsiella R amp, S ancef  8/31 BAL cx: enterobacter (R ancef, ceftaz, Zosyn)  Richardine Service, PharmD PGY2 Cardiology Pharmacy Resident Phone: [phone] 12/17/2019  8:58 AM  Please check [email or web address] for unit-specific pharmacy phone numbers.

## 2019-12-18 LAB — POCT I-STAT 7, (LYTES, BLD GAS, ICA,H+H)
Acid-base deficit: 18 mmol/L — ABNORMAL HIGH (ref 0.0–2.0)
Bicarbonate: 13.2 mmol/L — ABNORMAL LOW (ref 20.0–28.0)
Calcium, Ion: 0.71 mmol/L — CL (ref 1.15–1.40)
HCT: 26 % — ABNORMAL LOW (ref 36.0–46.0)
Hemoglobin: 8.8 g/dL — ABNORMAL LOW (ref 12.0–15.0)
O2 Saturation: 79 %
Potassium: 3.2 mmol/L — ABNORMAL LOW (ref 3.5–5.1)
Sodium: 146 mmol/L — ABNORMAL HIGH (ref 135–145)
TCO2: 15 mmol/L — ABNORMAL LOW (ref 22–32)
pCO2 arterial: 60.3 mmHg — ABNORMAL HIGH (ref 32.0–48.0)
pH, Arterial: 6.949 — CL (ref 7.350–7.450)
pO2, Arterial: 69 mmHg — ABNORMAL LOW (ref 83.0–108.0)

## 2019-12-18 LAB — GLUCOSE, CAPILLARY
Glucose-Capillary: 107 mg/dL — ABNORMAL HIGH (ref 70–99)
Glucose-Capillary: 57 mg/dL — ABNORMAL LOW (ref 70–99)
Glucose-Capillary: 70 mg/dL (ref 70–99)

## 2019-12-18 LAB — RENAL FUNCTION PANEL
Albumin: 1.4 g/dL — ABNORMAL LOW (ref 3.5–5.0)
Anion gap: 36 — ABNORMAL HIGH (ref 5–15)
BUN: 37 mg/dL — ABNORMAL HIGH (ref 8–23)
CO2: 14 mmol/L — ABNORMAL LOW (ref 22–32)
Calcium: 6.3 mg/dL — CL (ref 8.9–10.3)
Chloride: 97 mmol/L — ABNORMAL LOW (ref 98–111)
Creatinine, Ser: 2.58 mg/dL — ABNORMAL HIGH (ref 0.44–1.00)
GFR calc Af Amer: 21 mL/min — ABNORMAL LOW (ref >60)
GFR calc non Af Amer: 19 mL/min — ABNORMAL LOW (ref >60)
Glucose, Bld: 72 mg/dL (ref 70–99)
Phosphorus: 9.8 mg/dL — ABNORMAL HIGH (ref 2.5–4.6)
Potassium: 3.4 mmol/L — ABNORMAL LOW (ref 3.5–5.1)
Sodium: 147 mmol/L — ABNORMAL HIGH (ref 135–145)

## 2019-12-18 LAB — LACTIC ACID, PLASMA: Lactic Acid, Venous: >11 mmol/L (ref 0.5–1.9)

## 2019-12-18 LAB — MAGNESIUM: Magnesium: 2.9 mg/dL — ABNORMAL HIGH (ref 1.7–2.4)

## 2019-12-18 LAB — APTT: aPTT: 74 seconds — ABNORMAL HIGH (ref 24–36)

## 2019-12-18 LAB — TRIGLYCERIDES: Triglycerides: 457 mg/dL — ABNORMAL HIGH (ref <150)

## 2019-12-18 LAB — PREPARE RBC (CROSSMATCH)

## 2019-12-18 MED ORDER — SODIUM BICARBONATE 8.4 % IV SOLN
INTRAVENOUS | Status: AC
  Administered 2019-12-18: 50 meq via INTRAVENOUS
  Filled 2019-12-18: qty 50

## 2019-12-18 MED ORDER — SODIUM BICARBONATE 8.4 % IV SOLN
INTRAVENOUS | Status: AC
  Administered 2019-12-18: 100 meq via INTRAVENOUS
  Filled 2019-12-18: qty 50

## 2019-12-18 MED ORDER — DEXTROSE 50 % IV SOLN
12.5000 g | INTRAVENOUS | Status: AC
  Administered 2019-12-18: 12.5 g via INTRAVENOUS

## 2019-12-18 MED ORDER — SODIUM BICARBONATE 8.4 % IV SOLN: 100.0000 meq | Freq: Once | INTRAVENOUS | Status: AC

## 2019-12-18 MED ORDER — SODIUM BICARBONATE 8.4 % IV SOLN: 50.0000 meq | Freq: Once | INTRAVENOUS | Status: AC

## 2019-12-18 MED FILL — Medication: Qty: 1 | Status: AC

## 2019-12-19 LAB — TYPE AND SCREEN
ABO/RH(D): B NEG
Antibody Screen: NEGATIVE
Unit division: 0
Unit division: 0

## 2019-12-19 LAB — BPAM RBC
Blood Product Expiration Date: 202109272359
Blood Product Expiration Date: 202109282359
ISSUE DATE / TIME: 202109060035
ISSUE DATE / TIME: 202109060035
Unit Type and Rh: 1700
Unit Type and Rh: 1700

## 2019-12-19 NOTE — Therapy (Signed)
[place], [name], [address] Phone: [phone]   Fax:  [phone]  Physical Therapy Treatment/Discharge Name: [name] Bean MRN: [id number] Date of Birth: 03-17-1951 Referring Provider (PT): Dr. Ashok Pall    Encounter Date: 11/13/2019    Past Medical History:  Diagnosis Date  . Anemia   . Anxiety   . Arthritis   . Asthma   . Broken arm    left arm  . Bronchitis   . Bursitis of left hip   . CAD (coronary artery disease)   . Candidiasis, vagina   . Cardiac arrest ([place]) 06/20/2016   at [facility]; ? due to flash pulm edema vs undertreated chronic CHF  . Cerumen impaction   . CHF (congestive heart failure) ([place])    RECENT ADMIT TO [facility]   . Colon, diverticulosis   . Depression   . Diabetes mellitus    15 YRS AGO  . Fatigue   . Gastroenteritis   . Hot flashes, menopausal   . Hyperlipidemia   . Knee pain, left   . Lumbar back pain   . Maxillary sinusitis    history  . Muscle tear    right gluteus  . Nausea   . Nicotine dependence   . OSA (obstructive sleep apnea) 10/16/2017  . Other B-complex deficiencies   . Otitis media, acute    left  . Peripheral neuropathy   . Spinal stenosis of lumbar region   . Tubulovillous adenoma of colon   . Unspecified essential hypertension     Past Surgical History:  Procedure Laterality Date  . CARDIAC CATHETERIZATION  2015, 2009, 2006  . CERVICAL SPINE SURGERY  2003  . CORONARY ANGIOPLASTY WITH STENT PLACEMENT  2002, 2015  . CORONARY STENT INTERVENTION N/A 12/19/2019   Procedure: CORONARY STENT INTERVENTION;  Surgeon: Wellington Hampshire, MD;  Location: [facility];  Service: Cardiovascular;  Laterality: N/A;  . DILATATION & CURETTAGE/HYSTEROSCOPY WITH MYOSURE N/A 10/03/2019   Procedure: DILATATION & CURETTAGE/HYSTEROSCOPY WITH MYOSURE;  Surgeon: [name], MD;  Location: [facility];  Service: Gynecology;  Laterality:  N/A;  request 7:30am OR time in [facility] Gynecology requests 30 minutes OR time  . EYE SURGERY     cataracts bilaterally  . POSTERIOR CERVICAL LAMINECTOMY N/A 10/23/2016   Procedure: POSTERIOR CERVICAL LAMINECTOMY MULTI LEVEL CERVICAL TWO- CERVICAL THREE, CERVICAL THREE- CERVICAL FOUR;  Surgeon: Ashok Pall, MD;  Location: [facility];  Service: Neurosurgery;  Laterality: N/A;  POSTERIOR  . RIGHT/LEFT HEART CATH AND CORONARY ANGIOGRAPHY N/A 12/15/2019   Procedure: RIGHT/LEFT HEART CATH AND CORONARY ANGIOGRAPHY;  Surgeon: Jolaine Artist, MD;  Location: [facility];  Service: Cardiovascular;  Laterality: N/A;    There were no vitals filed for this visit.                                PT Short Term Goals - 12/12/2019 1630      PT SHORT TERM GOAL #1   Title Pt will complete balance re-assessment and understand results, set goal for this episode.    Baseline Berg previously assessed, retested 11/21/2019    Time 4    Period Weeks    Status Achieved      PT SHORT TERM GOAL #2   Title Pt will be I with simple HEP for hip, core and balance    Baseline updates ongoing, continue goal, current  HEP focus for hip    Time 4    Period Weeks             PT Long Term Goals - 12/06/2019 1634      PT LONG TERM GOAL #1   Title Pt will be able to negotiate 8 steps with 1 rail and improved confidence, less dependence on UEs, reciprocal pattern.    Baseline can do this especially after PT, limited mostly by ataxia, tone in LEs    Time 8    Period Weeks    Status On-going    Target Date 01/30/20      PT LONG TERM GOAL #2   Title Pt will be able to lie on Rt hip at night without increased pain    Baseline hip pain improving but goal ongoing    Time 8    Period Weeks    Status On-going      PT LONG TERM GOAL #3   Title FOTO score will improve to 45% to demo improved functional mobility    Baseline 60% limited    Time 8    Period Weeks    Status On-going     Target Date 01/30/20      PT LONG TERM GOAL #4   Title Patient will increase hip AROM in sitting to be able to put Rt foot on Lt thigh.    Baseline partially met    Time 8    Period Weeks    Status Partially Met      PT LONG TERM GOAL #5   Title Improve Berg Balance test score at least 3-5 points for decreased fall risk    Baseline 46/54    Time 8    Period Weeks    Status On-going    Target Date 01/30/20                  Patient will benefit from skilled therapeutic intervention in order to improve the following deficits and impairments:  Abnormal gait, Decreased balance, Decreased endurance, Decreased mobility, Difficulty walking, Obesity, Decreased range of motion, Decreased strength, Increased fascial restricitons, Impaired flexibility, Impaired UE functional use, Postural dysfunction, Pain  Visit Diagnosis: Pain in right hip - Plan: PT plan of care cert/re-cert  Stiffness of right hip, not elsewhere classified - Plan: PT plan of care cert/re-cert     Problem List Patient Active Problem List   Diagnosis Date Noted  . Non-ST elevation (NSTEMI) myocardial infarction ([place])   . Acute respiratory failure ([place])   . Cardiac arrest ([place]) 11/15/2019  . Shock ([place])   . ARDS (adult respiratory distress syndrome) ([place])   . OSA (obstructive sleep apnea) 10/16/2017  . Cervical stenosis of spinal canal 10/23/2016  . Neck swelling 09/28/2011  . Weight gain 01/02/2011  . Carpal tunnel syndrome 11/02/2010  . Leg swelling 08/26/2010  . ADRENAL MASS, LEFT 05/23/2010  . SPINAL STENOSIS, LUMBAR 12/27/2009  . B12 DEFICIENCY 06/10/2009  . ANEMIA 06/10/2009  . BACK PAIN, LUMBAR 11/06/2008  . KNEE PAIN, LEFT 02/03/2008  . DIABETES MELLITUS, TYPE II, UNCONTROLLED 01/12/2008  . MENOPAUSE-RELATED VASOMOTOR SYMPTOMS, HOT FLASHES 06/23/2007  . BURSITIS, LEFT HIP 06/23/2007  . HYPERLIPIDEMIA 03/25/2007  . ANXIETY 03/25/2007  . DEPRESSION 03/25/2007  . PERIPHERAL NEUROPATHY  03/25/2007  . HYPERTENSION 03/25/2007  . CORONARY ARTERY DISEASE 03/25/2007  . DIVERTICULOSIS, COLON 03/25/2007  . TUBULOVILLOUS ADENOMA, COLON 02/18/2007       PHYSICAL THERAPY DISCHARGE SUMMARY  Visits from Start of  Care: 9  Current functional level related to goals / functional outcomes: Pt. Is deceased   Remaining deficits: NA   Education / Equipment: NA Plan:                                                    Patient goals were not met. Patient is being discharged due to                                                     ?????           Beaulah Dinning, PT, DPT 12/19/19 2:31 PM       [facility] [address] [place], [name], [address] Phone: [phone]   Fax:  [phone]  Name: [name] Bean MRN: [id number] Date of Birth: 02-07-1951

## 2019-12-20 LAB — PATHOLOGIST SMEAR REVIEW

## 2020-01-02 ENCOUNTER — Other Ambulatory Visit: Payer: Self-pay | Admitting: Family Medicine

## 2020-01-12 NOTE — Progress Notes (Signed)
Noted transitioning to comfort care per notes OK to d/c CRRT when transition is made, miscellaneous nursing order written Will sign off.   Call with questions.  Madelon Lips MD [facility] pgr [phone]

## 2020-01-12 NOTE — Progress Notes (Signed)
[facility] Progress Note Patient Name: Chakia Counts DOB: 08-07-1950 MRN: [id number]   Date of Service  01/16/20  HPI/Events of Note  Patient with hypotension in the context of a profound combined metabolic and respiratory acidosis (PH 6.9).  eICU Interventions  Sodium bicarbonate 150 meq iv push stat, Respiratory rate on the ventilator increased from 32 to 35.        Kerry Kass Katiejo Gilroy 01-16-20, 5:52 AM

## 2020-01-12 NOTE — Progress Notes (Signed)
Hypoglycemic Event  CBG: 57  Treatment: 50% Dextrose 12.5g  Symptoms: Unknown  Follow-up CBG: Time:0532 CBG Result:70  Possible Reasons for Event: Hemodynamically unstable, metabolic acidosis. Comments/MD notified: Dr. Otho Darner

## 2020-01-12 NOTE — Death Summary Note (Addendum)
DEATH SUMMARY   Patient Details  Name: [name] Bean MRN: [id number] DOB: 14-Sep-1950  Admission/Discharge Information   Admit Date:  12-27-2019  Date of Death: Date of Death: January 09, 2020  Time of Death: Time of Death: 0710  Length of Stay: 06/17/2022  Referring Physician: Billie Ruddy, MD   Reason(s) for Hospitalization  Cardiac arrest  Diagnoses  Preliminary cause of death: acute renal failure  Secondary Diagnoses (including complications and co-morbidities):  Active Problems:   Cardiac arrest ([place])   Acute respiratory failure (H[facility])   Non-ST elevation (NSTEMI) myocardial infarction ([place]) Suspected sepsis-ruled out.  Brief Hospital Course (including significant findings, care, treatment, and services provided and events leading to death)  [name] Bean is a 69 y.o. year old female who presented to the ED after a primary respiratory PEA cardiac arrest.  Extensive past medical history of CHF coronary disease untreated sleep apnea and morbid obesity.  Prior admissions for shortness of breath and prior cardiac arrest under similar circumstances.  History of noncompliance.  Working diagnosis was decompensated heart failure.  She woke up and weaned from the ventilator.  Was found to have had a non-ST elevation MI and underwent stenting of her RCA.  Further progressive respiratory failure reintubated again following a brief PEA arrest.  Continue to be in ventilator dependent respiratory failure.  Evidence of pulmonary edema radiographically.  Attempted diuresis but worsening renal failure.  Eventually progressed to severe hyperkalemia and acidosis.  Continued to deteriorate with increasing vasopressor requirements and spite of initiation of CRRT.  Family eventually opted to transition patient to comfort care.    Pertinent Labs and Studies  Significant Diagnostic Studies DG Chest 1 View  Result Date: 12/15/2019 CLINICAL DATA:  ARDS.  Intubation. EXAM: CHEST  1 VIEW  COMPARISON:  12/14/2019. FINDINGS: Endotracheal tube, NG tube, left IJ line in stable position. Heart size normal. Diffuse dense bilateral pulmonary infiltrates/edema are again noted, progressed from prior exam. No pleural effusion or pneumothorax. Prior cervical spine fusion. IMPRESSION: 1.  Lines and tubes in stable position. 2. Diffuse dense bilateral pulmonary infiltrates/edema again noted. Interim progression from prior exam. Electronically Signed   By: Marcello Moores  Register   On: 12/15/2019 08:23   DG Chest 1 View  Result Date: 12/14/2019 CLINICAL DATA:  History of respiratory failure with endotracheal tube. EXAM: CHEST  1 VIEW COMPARISON:  12/28/2019 FINDINGS: Endotracheal tube approximately 4 cm above the carina, unchanged accounting for rotation and position. LEFT-sided central venous access device terminates at the caval to atrial junction. Gastric tube courses through in off the field of the radiograph. Tip below the LEFT hemidiaphragm. Cardiomediastinal contours mildly enlarged and accentuated by portable technique. Diffuse interstitial and airspace opacities bilaterally in the chest showing perhaps slight worsening in the RIGHT upper lobe compared to the previous study. LEFT and RIGHT hemidiaphragm remain obscured. The no acute skeletal process. IMPRESSION: 1. Slight worsening of, particularly in the RIGHT upper lobe, of bilateral interstitial and airspace opacities with effusions suggestive of multifocal pneumonia or edema. Follow-up is suggested to ensure resolution. 2. Unchanged support lines and tubes, tip below the LEFT hemidiaphragm. Electronically Signed   By: Zetta Bills M.D.   On: 12/14/2019 08:38   DG Chest 1 View  Result Date: 12/08/2019 CLINICAL DATA:  Intubation.  Respiratory failure. EXAM: CHEST  1 VIEW COMPARISON:  12/07/2019. FINDINGS: Interim placement of NG tube, its tip is below left hemidiaphragm. Endotracheal tube, left IJ line in stable position. Heart size normal. Diffuse  bilateral pulmonary infiltrates/edema  with slight improvement from prior exam. No prominent pleural effusion. No pneumothorax. Prior cervical spine fusion. IMPRESSION: 1. Interim placement of NG tube, its tip is below left hemidiaphragm. Endotracheal tube and left IJ line in stable position. 2. Diffuse bilateral pulmonary infiltrates/edema with slight improvement from prior exam. 3.  Heart size normal. Electronically Signed   By: Marcello Moores  Register   On: 12/08/2019 07:43   CT HEAD WO CONTRAST  Result Date: 12/06/2019 CLINICAL DATA:  Altered mental status, cardiac arrest EXAM: CT HEAD WITHOUT CONTRAST TECHNIQUE: Contiguous axial images were obtained from the base of the skull through the vertex without intravenous contrast. COMPARISON:  06/20/2016 FINDINGS: Brain: Normal anatomic configuration. No abnormal intra or extra-axial mass lesion or fluid collection. No abnormal mass effect or midline shift. No evidence of acute intracranial hemorrhage or infarct. Ventricular size is normal. Cerebellum unremarkable. Vascular: Unremarkable Skull: Intact Sinuses/Orbits: Small air-fluid levels are noted within the sphenoid sinus, nonspecific in the setting of intubation. Remaining paranasal sinuses are clear. Orbits are unremarkable. Other: Mastoid air cells and middle ear cavities are clear. IMPRESSION: 1. No acute intracranial abnormality. Electronically Signed   By: Fidela Salisbury MD   On: 12/06/2019 01:22   CT ANGIO CHEST PE W OR WO CONTRAST  Result Date: 12/06/2019 CLINICAL DATA:  69 year old female with concern for pulmonary embolism. Status post cardiac arrest. EXAM: CT ANGIOGRAPHY CHEST WITH CONTRAST TECHNIQUE: Multidetector CT imaging of the chest was performed using the standard protocol during bolus administration of intravenous contrast. Multiplanar CT image reconstructions and MIPs were obtained to evaluate the vascular anatomy. CONTRAST:  57m OMNIPAQUE IOHEXOL 350 MG/ML SOLN COMPARISON:  Chest radiograph  dated 11/15/2019. FINDINGS: Cardiovascular: There is no cardiomegaly or pericardial effusion. Three-vessel coronary vascular calcification noted. There is mild atherosclerotic calcification of the thoracic aorta. No pulmonary artery embolus identified. Mediastinum/Nodes: Top-normal hilar lymph nodes measure 10 mm in short axis on the right. Subcarinal lymph node measures 13 mm in short axis. An enteric tube is noted within the esophagus extending into the stomach. No mediastinal fluid collection. Lungs/Pleura: There are small bilateral pleural effusions. There is diffuse interstitial and interlobular septal prominence with diffuse ground-glass airspace opacity throughout the lungs. Findings may represent edema or ARDS. Clusters of airspace consolidation primarily involving the left lower lobe as well as upper lobes may represent pneumonia. Clinical correlation recommended. There is no pneumothorax. The central airways are patent. An endotracheal tube is noted with tip above the carina. Upper Abdomen: Indeterminate left adrenal thickening measure 17 mm in thickness. Musculoskeletal: There is degenerative changes of the spine. No acute osseous pathology. Review of the MIP images confirms the above findings. IMPRESSION: 1. No CT evidence of pulmonary embolism. 2. Small bilateral pleural effusions with findings of pulmonary edema or ARDS. 3. Clusters of airspace consolidation primarily involving the left lower lobe as well as upper lobes may represent pneumonia. Clinical correlation recommended. 4. Aortic Atherosclerosis (ICD10-I70.0). Electronically Signed   By: AAnner CreteM.D.   On: 12/06/2019 01:31   CARDIAC CATHETERIZATION  Result Date: 01/08/2020  Prox RCA to Mid RCA lesion is 50% stenosed.  Previously placed Mid RCA to Dist RCA stent (unknown type) is widely patent.  Dist RCA lesion is 95% stenosed.  Post intervention, there is a 0% residual stenosis.  A drug-eluting stent was successfully placed  using a STENT RESOLUTE ONYX 2.0X15.  Successful angioplasty and drug-eluting stent placement to distal right coronary artery. Recommendations: Dual antiplatelet therapy for at least 12 months. Continue vent management  and supportive care. Aggressive treatment of cardiovascular risk factors.   CARDIAC CATHETERIZATION  Result Date: 12/22/2019  Prox RCA to Mid RCA lesion is 50% stenosed.  Previously placed Mid RCA to Dist RCA stent (unknown type) is widely patent.  Dist RCA lesion is 95% stenosed.  Prox Cx to Mid Cx lesion is 40% stenosed.  Mid LAD lesion is 40% stenosed.  Previously placed Dist LAD stent (unknown type) is widely patent.  Findings: Ao = 103/45 (65) LV = 105/12 RA =  8 RV = 36/8 PA = 32/14 (21) PCW = 14 Fick cardiac output/index = 4.7/2.5 PVR = 1.5 WU FA sat = 97% PA sat = 47%, 49% Assessment: 1. CAD with patent stents in RCA and LAD. 2. 95% stenosis in distal RCA otherwise non-obstructive CAD 3. EF hard to assess ~ 40-45% 4. Normal filling pressures with low MV sat but preserved CO Plan/Discussion: Will plan PCI of distal RCA. Glori Bickers, MD 3:11 PM   DG CHEST PORT 1 VIEW  Result Date: 12/17/2019 CLINICAL DATA:  Hypoxia. EXAM: PORTABLE CHEST 1 VIEW COMPARISON:  Radiograph yesterday. FINDINGS: Endotracheal tube tip remains at the thoracic inlet. Enteric tube tip remains below the diaphragm not included in the field of view. Left internal jugular central line remains in place. Lower lung volumes from prior exam with worsening bilateral lung opacities. Stable heart size and mediastinal contours. No visualized pneumothorax. No evidence of pneumomediastinum. Hazy opacity at the right lung base may represent developing effusion. IMPRESSION: 1. Lower lung volumes from prior exam with worsening bilateral lung opacities, pulmonary edema, pneumonia, or ARDS. 2. Hazy opacity at the right lung base may represent developing effusion. 3. Stable support apparatus. Electronically Signed   By: Keith Rake M.D.   On: 12/17/2019 22:25   DG CHEST PORT 1 VIEW  Result Date: 12/16/2019 CLINICAL DATA:  Respiratory failure with hypoxia. EXAM: PORTABLE CHEST 1 VIEW COMPARISON:  Radiograph yesterday. FINDINGS: Endotracheal tube tip at the thoracic inlet. Enteric tube in place with tip below the diaphragm. Left central line unchanged tip in the SVC. Improving bilateral lung aeration with decreasing perihilar opacities. Persistent bilateral lung opacities in a basilar predominant distribution. Cardiomegaly with unchanged mediastinal contours. Aortic atherosclerosis. No pneumothorax or evidence of pneumomediastinum. Stable osseous structures. IMPRESSION: 1. Improving bilateral lung aeration with decreasing perihilar opacities, improving edema or pneumonia. Persistent bilateral lung opacities in a basilar predominant distribution. 2. Stable support apparatus. Electronically Signed   By: Keith Rake M.D.   On: 12/16/2019 19:24   Portable Chest xray  Result Date: 12/19/2019 CLINICAL DATA:  Status post cardiac arrest. EXAM: PORTABLE CHEST 1 VIEW COMPARISON:  December 12, 2019. FINDINGS: The heart size and mediastinal contours are within normal limits. Endotracheal and nasogastric tubes are unchanged in position. Left internal jugular catheter is unchanged in position. Stable bilateral lung opacities are noted concerning for multifocal pneumonia or edema. No pneumothorax is noted. Small left pleural effusion may be present. The visualized skeletal structures are unremarkable. IMPRESSION: Stable support apparatus. Stable bilateral lung opacities are noted concerning for multifocal pneumonia or edema. Electronically Signed   By: Marijo Conception M.D.   On: 12/20/2019 08:49   Portable Chest x-ray  Result Date: 12/12/2019 CLINICAL DATA:  Intubated, enteric tube placement after cardiac arrest EXAM: PORTABLE CHEST 1 VIEW COMPARISON:  Chest radiograph from one day prior. FINDINGS: Endotracheal tube tip is in the proximal  right mainstem bronchus. Enteric tube enters stomach with the tip not seen on this image.  Left internal jugular central venous catheter terminates in the lower third of the SVC. Stable cardiomediastinal silhouette with normal heart size. No pneumothorax. Possible trace bilateral pleural effusions. Extensive patchy opacities throughout both lungs, most prominent in left greater than right parahilar lungs, worsened. IMPRESSION: 1. Endotracheal tube tip in the proximal right mainstem bronchus. 2. Extensive patchy opacities throughout both lungs, most prominent in the left greater than right parahilar lungs, worsened, compatible with any combination of worsening pulmonary edema, aspiration or atelectasis. Critical Value/emergent results were called by telephone at the time of interpretation on 12/12/2019 at 10:24 am to provider [facility] , who verbally acknowledged these results. Electronically Signed   By: Ilona Sorrel M.D.   On: 12/12/2019 10:28   DG Chest Port 1 View  Result Date: 12/11/2019 CLINICAL DATA:  Cardiac arrest and respiratory failure. EXAM: PORTABLE CHEST 1 VIEW COMPARISON:  12/10/2019 FINDINGS: Endotracheal tube and gastric decompression tube have been removed. Central line shows stable positioning with catheter tip in the SVC. The heart size is stable. Stable probable component of underlying interstitial edema and areas of bilateral atelectasis. There may be small bilateral pleural effusions. No pneumothorax. IMPRESSION: Status post extubation. Stable probable component of interstitial edema and areas of bilateral atelectasis. Possible small bilateral pleural effusions. Electronically Signed   By: Aletta Edouard M.D.   On: 12/11/2019 08:10   DG Chest Port 1 View  Result Date: 12/10/2019 CLINICAL DATA:  Evaluate for diffuse bilateral pulmonary infiltrates/edema. EXAM: PORTABLE CHEST 1 VIEW COMPARISON:  December 08, 2019 FINDINGS: Stable cardiomegaly. The hila and mediastinum are unchanged. The  ETT is in good position. The left central line terminates in the SVC. The OG tube terminates below today's film. No pneumothorax. Mild patchy bilateral pulmonary opacities. No other acute abnormalities. IMPRESSION: 1. Support apparatus as above. 2. Bilateral patchy pulmonary opacities could represent multifocal pneumonia or asymmetric edema. Recommend clinical correlation. Electronically Signed   By: Dorise Bullion III M.D   On: 12/10/2019 10:41   DG CHEST PORT 1 VIEW  Result Date: 12/07/2019 CLINICAL DATA:  Hypoxia.  Status post cardiac arrest EXAM: PORTABLE CHEST 1 VIEW COMPARISON:  December 07, 2019 study obtained earlier in the day FINDINGS: Endotracheal tube tip is 1.7 cm above the carina. Central catheter tip is in the superior vena cava. Nasogastric tube no longer present. No pneumothorax. Extensive airspace opacity is present, essentially stable on the left and overall increased on the right, particular in the right perihilar region. Heart size is borderline prominent with pulmonary vascularity normal. There is aortic atherosclerosis. No adenopathy. No bone lesions. IMPRESSION: Nasogastric tube no longer present. Other tube and catheter positions unchanged. No pneumothorax appreciable. Widespread airspace opacity, stable on the left and increased on the right. Given clinical history, a degree of aspiration on the right must be of concern. Stable cardiac silhouette. Aortic Atherosclerosis (ICD10-I70.0). Electronically Signed   By: Lowella Grip III M.D.   On: 12/07/2019 09:56   DG Chest Port 1 View  Result Date: 12/07/2019 CLINICAL DATA:  Hypoxia EXAM: PORTABLE CHEST 1 VIEW COMPARISON:  December 06, 2019 chest radiograph and CT angiogram chest FINDINGS: Endotracheal tube tip is 3.3 cm above the carina. Nasogastric tube tip and side port are below the diaphragm. Central catheter tip is in the superior vena cava. No pneumothorax. There is patchy airspace opacity throughout the lungs bilaterally,  essentially stable compared to 1 day prior. No new opacity evident. Heart is mildly enlarged with pulmonary vascularity normal. No adenopathy. Postoperative change  noted in the lower cervical spine. IMPRESSION: Tube and catheter positions as described without pneumothorax. Airspace opacity bilaterally consistent with multifocal pneumonia, essentially stable compared to 1 day prior. No new opacity evident. Stable cardiac prominence. Electronically Signed   By: Lowella Grip III M.D.   On: 12/07/2019 08:07   DG Chest Port 1 View  Result Date: 12/06/2019 CLINICAL DATA:  Hypoxemia. EXAM: PORTABLE CHEST 1 VIEW COMPARISON:  December 05, 2019. FINDINGS: Stable cardiomediastinal silhouette. Endotracheal and nasogastric tubes are unchanged in position. Left internal jugular catheter is unchanged in position. Decreased pulmonary opacities are noted suggesting improving pneumonia. No pneumothorax or pleural effusion is noted. Bony thorax is unremarkable. IMPRESSION: Stable support apparatus. Decreased pulmonary opacities are noted suggesting improving pneumonia. Electronically Signed   By: Marijo Conception M.D.   On: 12/06/2019 08:15   DG CHEST PORT 1 VIEW  Result Date: 12/08/2019 CLINICAL DATA:  Endotracheal tube EXAM: PORTABLE CHEST 1 VIEW COMPARISON:  Earlier same day FINDINGS: Endotracheal tube is approximately 2.5 cm above the carina. Enteric tube passes below the diaphragm with tip out of field of view. New left IJ central line tip overlies the SVC. Persistent diffuse bilateral pulmonary opacities without substantial change in lung aeration. No pleural effusion. No pneumothorax. Normal heart size. IMPRESSION: Lines and tubes as above. Persistent diffuse bilateral pulmonary opacities without substantial change. Electronically Signed   By: Macy Mis M.D.   On: 11/28/2019 16:26   DG Chest Port 1 View  Result Date: 12/08/2019 CLINICAL DATA:  Cardiac arrest. EXAM: PORTABLE CHEST 1 VIEW COMPARISON:  Chest  x-ray dated June 02, 2004. FINDINGS: Endotracheal tube tip 1.6 cm above the carina. Enteric tube entering the stomach with the tip below the field of view. The heart size and mediastinal contours are within normal limits. Diffuse opacities throughout both lungs, denser on the right, with relative subpleural sparing. No pneumothorax or large pleural effusion. No acute osseous abnormality. IMPRESSION: 1. Appropriately positioned endotracheal tube. 2. Diffuse opacities throughout both lungs, suspicious for capillary leak pulmonary edema such as from prolonged hypotension given clinical history. Diffuse alveolar hemorrhage or ARDS could have a similar appearance. Electronically Signed   By: Titus Dubin M.D.   On: 12/04/2019 14:43   DG Abd Portable 1V  Result Date: 12/07/2019 CLINICAL DATA:  Check feeding catheter placement EXAM: PORTABLE ABDOMEN - 1 VIEW COMPARISON:  None. FINDINGS: Gastric catheter is noted within the stomach. No obstructive changes are seen. No bony abnormality is noted. IMPRESSION: Gastric catheter within the stomach. Electronically Signed   By: Inez Catalina M.D.   On: 12/07/2019 19:18   DG Swallowing Func-Speech Pathology  Result Date: 12/11/2019 Objective Swallowing Evaluation: Type of Study: MBS-Modified Barium Swallow Study  Patient Details Name: Renell Coaxum MRN: [id number] Date of Birth: Dec 26, 1950 Today's Date: 12/11/2019 Time: SLP Start Time (ACUTE ONLY): 1318 -SLP Stop Time (ACUTE ONLY): 1340 SLP Time Calculation (min) (ACUTE ONLY): 22 min Past Medical History: Past Medical History: Diagnosis Date . Anemia  . Anxiety  . Arthritis  . Asthma  . Broken arm   left arm . Bronchitis  . Bursitis of left hip  . CAD (coronary artery disease)  . Candidiasis, vagina  . Cardiac arrest ([place]) 06/20/2016  at [facility]; ? due to flash pulm edema vs undertreated chronic CHF . Cerumen impaction  . CHF (congestive heart failure) ([place])   RECENT ADMIT TO [facility]  . Colon, diverticulosis  .  Depression  . Diabetes mellitus   15 YRS AGO .  Fatigue  . Gastroenteritis  . Hot flashes, menopausal  . Hyperlipidemia  . Knee pain, left  . Lumbar back pain  . Maxillary sinusitis   history . Muscle tear   right gluteus . Nausea  . Nicotine dependence  . OSA (obstructive sleep apnea) 10/16/2017 . Other B-complex deficiencies  . Otitis media, acute   left . Peripheral neuropathy  . Spinal stenosis of lumbar region  . Tubulovillous adenoma of colon  . Unspecified essential hypertension  Past Surgical History: Past Surgical History: Procedure Laterality Date . CARDIAC CATHETERIZATION  2015, 2009, 2006 . CERVICAL SPINE SURGERY  2003 . CORONARY ANGIOPLASTY WITH STENT PLACEMENT  2002, 2015 . DILATATION & CURETTAGE/HYSTEROSCOPY WITH MYOSURE N/A 10/03/2019  Procedure: DILATATION & CURETTAGE/HYSTEROSCOPY WITH MYOSURE;  Surgeon: [name], MD;  Location: [place];  Service: Gynecology;  Laterality: N/A;  request 7:30am OR time in [facility] Gynecology requests 30 minutes OR time . EYE SURGERY    cataracts bilaterally . POSTERIOR CERVICAL LAMINECTOMY N/A 10/23/2016  Procedure: POSTERIOR CERVICAL LAMINECTOMY MULTI LEVEL CERVICAL TWO- CERVICAL THREE, CERVICAL THREE- CERVICAL FOUR;  Surgeon: Ashok Pall, MD;  Location: [place];  Service: Neurosurgery;  Laterality: N/A;  POSTERIOR HPI: Pt is a74 y.o.female with PMH significant for HTN, HLD, DM, CHF, cardiac arrest, CAD, posterior cervical laminectomy 2018, and anxiety who presented to the ED s/p cardiac arrest. Per report patient called EMS with acute complaints of SOB and found unresponsive in PEA arrest. EMS provided 3 rounds of CPR and obtained ROSC prior to ED arrival. No ACLS medications were given. ETT 8/24-8/26. CXR 8/30: Stable probable component of interstitial edema and areas of bilateral atelectasis. Possible small bilateral pleural effusions. Per order, RN swallow eval completed but pt persistently coughing with swallowing trials on 8/29  No  data recorded Assessment / Plan / Recommendation CHL IP CLINICAL IMPRESSIONS 12/11/2019 Clinical Impression  Pt presents with pharyngeal dysphagia characterized by a pharyngeal delay, reduced lingual retraction, and reduced anterior laryngeal movement. Pt's swallow was triggered with the head of the bolus at the level of valleculae and pyriform sinuses. She demonstrated vallecular residue and pyriform sinus residue which was mild to moderate depending on bolus size and consistency. Pt exhibited aspiration (PAS 7) with thin liquids and penetration (PAS 3) and ultimate aspiration (PAS 7, 8) was also noted with nectar thick liquids. Most instances of laryngeal invasion resulted in throat clearing and/or coughing. This was effective in propelling the aspirant superiorly but subsequent aspiration was inconsistently noted. Postural modifications were attempted but no functional benefit was noted. Use of an effortful swallow inconsistently eliminated laryngeal invasion with nectar thick liquids but not with thin liquids. A dysphagia 2 diet with honey thick liquids is recommended at this time with allowance of ice chips between meals following oral care. SLP SLP Visit Diagnosis Dysphagia, pharyngeal phase (R13.13) Attention and concentration deficit following -- Frontal lobe and executive function deficit following -- Impact on safety and function Mild aspiration risk;Moderate aspiration risk   CHL IP TREATMENT RECOMMENDATION 12/11/2019 Treatment Recommendations Therapy as outlined in treatment plan below   Prognosis 12/11/2019 Prognosis for Safe Diet Advancement Good Barriers to Reach Goals Time post onset Barriers/Prognosis Comment -- CHL IP DIET RECOMMENDATION 12/11/2019 SLP Diet Recommendations Dysphagia 2 (Fine chop) solids;Honey thick liquids Liquid Administration via Cup;Straw Medication Administration Whole meds with liquid Compensations Slow rate;Small sips/bites;Follow solids with liquid;Clear throat intermittently  Postural Changes Seated upright at 90 degrees   CHL IP OTHER RECOMMENDATIONS 12/11/2019 Recommended Consults -- Oral Care  Recommendations Oral care BID Other Recommendations Order thickener from pharmacy   CHL IP FOLLOW UP RECOMMENDATIONS 12/11/2019 Follow up Recommendations Home health SLP   CHL IP FREQUENCY AND DURATION 12/11/2019 Speech Therapy Frequency (ACUTE ONLY) min 2x/week Treatment Duration 2 weeks      CHL IP ORAL PHASE 12/11/2019 Oral Phase WFL Oral - Pudding Teaspoon -- Oral - Pudding Cup -- Oral - Honey Teaspoon -- Oral - Honey Cup -- Oral - Nectar Teaspoon -- Oral - Nectar Cup -- Oral - Nectar Straw -- Oral - Thin Teaspoon -- Oral - Thin Cup -- Oral - Thin Straw -- Oral - Puree -- Oral - Mech Soft -- Oral - Regular -- Oral - Multi-Consistency -- Oral - Pill -- Oral Phase - Comment --  CHL IP PHARYNGEAL PHASE 12/11/2019 Pharyngeal Phase Impaired Pharyngeal- Pudding Teaspoon -- Pharyngeal -- Pharyngeal- Pudding Cup -- Pharyngeal -- Pharyngeal- Honey Teaspoon -- Pharyngeal -- Pharyngeal- Honey Cup Reduced anterior laryngeal mobility;Delayed swallow initiation-vallecula;Delayed swallow initiation-pyriform sinuses;Pharyngeal residue - valleculae Pharyngeal Material does not enter airway Pharyngeal- Nectar Teaspoon -- Pharyngeal -- Pharyngeal- Nectar Cup Reduced anterior laryngeal mobility;Delayed swallow initiation-vallecula;Delayed swallow initiation-pyriform sinuses;Penetration/Aspiration before swallow;Penetration/Aspiration during swallow;Penetration/Apiration after swallow;Trace aspiration;Pharyngeal residue - valleculae Pharyngeal Material enters airway, remains ABOVE vocal cords and not ejected out;Material enters airway, passes BELOW cords and not ejected out despite cough attempt by patient Pharyngeal- Nectar Straw -- Pharyngeal -- Pharyngeal- Thin Teaspoon -- Pharyngeal -- Pharyngeal- Thin Cup Reduced anterior laryngeal mobility;Delayed swallow initiation-vallecula;Delayed swallow initiation-pyriform  sinuses;Penetration/Aspiration before swallow;Penetration/Aspiration during swallow;Penetration/Apiration after swallow;Trace aspiration;Pharyngeal residue - valleculae Pharyngeal Material enters airway, passes BELOW cords and not ejected out despite cough attempt by patient Pharyngeal- Thin Straw -- Pharyngeal -- Pharyngeal- Puree Reduced anterior laryngeal mobility;Delayed swallow initiation-vallecula;Delayed swallow initiation-pyriform sinuses;Pharyngeal residue - valleculae Pharyngeal -- Pharyngeal- Mechanical Soft Reduced anterior laryngeal mobility;Delayed swallow initiation-vallecula;Delayed swallow initiation-pyriform sinuses;Pharyngeal residue - valleculae Pharyngeal -- Pharyngeal- Regular Reduced anterior laryngeal mobility;Delayed swallow initiation-vallecula;Delayed swallow initiation-pyriform sinuses;Pharyngeal residue - valleculae Pharyngeal -- Pharyngeal- Multi-consistency -- Pharyngeal -- Pharyngeal- Pill Reduced anterior laryngeal mobility;Delayed swallow initiation-vallecula;Delayed swallow initiation-pyriform sinuses;Pharyngeal residue - valleculae Pharyngeal -- Pharyngeal Comment --  CHL IP CERVICAL ESOPHAGEAL PHASE 12/11/2019 Cervical Esophageal Phase WFL Pudding Teaspoon -- Pudding Cup -- Honey Teaspoon -- Honey Cup -- Nectar Teaspoon -- Nectar Cup -- Nectar Straw -- Thin Teaspoon -- Thin Cup -- Thin Straw -- Puree -- Mechanical Soft -- Regular -- Multi-consistency -- Pill -- Cervical Esophageal Comment -- Shanika I. Hardin Negus, [place], [place] Office number [phone] Pager [phone] Horton Marshall 12/11/2019, 2:42 PM              ECHOCARDIOGRAM COMPLETE  Result Date: 11/24/2019    ECHOCARDIOGRAM REPORT   Patient Name:   ELWYN LOWDEN Tyminski Date of Exam: 11/14/2019 Medical Rec #:  [id number]                Height:       62.0 in Accession #:    [id number]               Weight:       209.7 lb Date of Birth:  1951/03/11                 BSA:          1.950 m  Patient Age:    51 years                 BP:  126/52 mmHg Patient Gender: F                        HR:           128 bpm. Exam Location:  Inpatient Procedure: 2D Echo and Intracardiac Opacification Agent STAT ECHO Indications:    Cardiac arrest I46.9  History:        Patient has no prior history of Echocardiogram examinations.                 CHF, CAD; Risk Factors:Dyslipidemia. Anemia.  Sonographer:    Darlina Sicilian RDCS Referring Phys: [id number] GRACE E BOWSER  Sonographer Comments: Technically difficult study due to poor echo windows and echo performed with patient supine and on artificial respirator. IMPRESSIONS  1. Endocardial border definition is poor but systolic function appears to be grossly normal. Cannot rule out mild septal hypokinesis. Left ventricular ejection fraction, by estimation, is 55 to 60%. The left ventricle has normal function. The left ventricle has no regional wall motion abnormalities. Left ventricular diastolic parameters are indeterminate.  2. Right ventricular systolic function is normal. The right ventricular size is normal. There is mildly elevated pulmonary artery systolic pressure.  3. The mitral valve is normal in structure. No evidence of mitral valve regurgitation. No evidence of mitral stenosis.  4. The aortic valve is normal in structure. Aortic valve regurgitation is not visualized. No aortic stenosis is present.  5. The inferior vena cava is normal in size with <50% respiratory variability, suggesting right atrial pressure of 8 mmHg. FINDINGS  Left Ventricle: Endocardial border definition is poor but systolic function appears to be grossly normal. Cannot rule out mild septal hypokinesis. Left ventricular ejection fraction, by estimation, is 55 to 60%. The left ventricle has normal function. The left ventricle has no regional wall motion abnormalities. Definity contrast agent was given IV to delineate the left ventricular endocardial borders. The left ventricular  internal cavity size was normal in size. There is no left ventricular hypertrophy. Left ventricular diastolic parameters are indeterminate. Right Ventricle: The right ventricular size is normal. No increase in right ventricular wall thickness. Right ventricular systolic function is normal. There is mildly elevated pulmonary artery systolic pressure. The tricuspid regurgitant velocity is 2.76  m/s, and with an assumed right atrial pressure of 8 mmHg, the estimated right ventricular systolic pressure is 49.8 mmHg. Left Atrium: Left atrial size was normal in size. Right Atrium: Right atrial size was normal in size. Pericardium: There is no evidence of pericardial effusion. Mitral Valve: The mitral valve is normal in structure. Moderately decreased mobility of the mitral valve leaflets. Moderate mitral annular calcification. No evidence of mitral valve regurgitation. No evidence of mitral valve stenosis. Tricuspid Valve: The tricuspid valve is normal in structure. Tricuspid valve regurgitation is mild . No evidence of tricuspid stenosis. Aortic Valve: The aortic valve is normal in structure.. There is mild thickening and mild calcification of the aortic valve. Aortic valve regurgitation is not visualized. No aortic stenosis is present. There is mild thickening of the aortic valve. There is mild calcification of the aortic valve. Pulmonic Valve: The pulmonic valve was normal in structure. Pulmonic valve regurgitation is not visualized. No evidence of pulmonic stenosis. Aorta: The aortic root is normal in size and structure. Venous: The inferior vena cava is normal in size with less than 50% respiratory variability, suggesting right atrial pressure of 8 mmHg. IAS/Shunts: No atrial level shunt detected by color flow Doppler.  LEFT  VENTRICLE PLAX 2D LVOT diam:     1.60 cm LV SV:         18 LV SV Index:   9 LVOT Area:     2.01 cm  AORTIC VALVE LVOT Vmax:   62.70 cm/s LVOT Vmean:  41.300 cm/s LVOT VTI:    0.087 m  AORTA Ao  Root diam: 2.40 cm TRICUSPID VALVE TR Peak grad:   30.5 mmHg TR Vmax:        276.00 cm/s  SHUNTS Systemic VTI:  0.09 m Systemic Diam: 1.60 cm Skeet Latch MD Electronically signed by Skeet Latch MD Signature Date/Time: 11/13/2019/6:19:04 PM    Final    ECHOCARDIOGRAM LIMITED  Result Date: 12/12/2019    ECHOCARDIOGRAM LIMITED REPORT   Patient Name:   KYRIANA YANKEE Stall Date of Exam: 12/12/2019 Medical Rec #:  [id number]                Height:       62.0 in Accession #:    [id number]               Weight:       186.3 lb Date of Birth:  08-04-1950                 BSA:          1.855 m Patient Age:    73 years                 BP:           137/66 mmHg Patient Gender: F                        HR:           98 bpm. Exam Location:  Inpatient Procedure: Limited Echo, Limited Color Doppler, Cardiac Doppler and Intracardiac            Opacification Agent Indications:    Acute Respiratory Insufficiency 518.82 / R06.89  History:        Patient has prior history of Echocardiogram examinations, most                 recent 12/09/2019. CHF, CAD; Risk Factors:Hypertension, Diabetes                 and Dyslipidemia. Cardiac arrest.  Sonographer:    Darlina Sicilian RDCS Referring Phys: [id number] Candee Furbish  Sonographer Comments: Echo performed with patient supine and on artificial respirator. IMPRESSIONS  1. Left ventricular ejection fraction, by estimation, is 45%. The left ventricle has mildly decreased function. Left ventricular endocardial border not optimally defined to evaluate regional wall motion. Left ventricular diastolic parameters are consistent with Grade I diastolic dysfunction (impaired relaxation).  2. Right ventricular systolic function was not well visualized. The right ventricular size is not well visualized.  3. The mitral valve was not well visualized. No evidence of mitral valve regurgitation. No evidence of mitral stenosis.  4. The aortic valve is tricuspid. Aortic valve regurgitation is not  visualized. Mild aortic valve sclerosis is present, with no evidence of aortic valve stenosis.  5. IVC not well-visualized.  6. Technically difficult study with very poor images. Would repeat when patient is more stable. FINDINGS  Left Ventricle: Left ventricular ejection fraction, by estimation, is 45%. The left ventricle has mildly decreased function. Left ventricular endocardial border not optimally defined to evaluate regional wall motion. Definity contrast agent was given IV  to delineate the  left ventricular endocardial borders. The left ventricular internal cavity size was normal in size. There is no left ventricular hypertrophy. Right Ventricle: The right ventricular size is not well visualized. Right ventricular systolic function was not well visualized. Pericardium: There is no evidence of pericardial effusion. Mitral Valve: The mitral valve was not well visualized. Mild mitral annular calcification. No evidence of mitral valve stenosis. Aortic Valve: The aortic valve is tricuspid. Aortic valve regurgitation is not visualized. Mild aortic valve sclerosis is present, with no evidence of aortic valve stenosis. Pulmonic Valve: The pulmonic valve was not well visualized. Pulmonic valve regurgitation is not visualized. Venous: The inferior vena cava was not well visualized.  LV Volumes (MOD) LV vol d, MOD A2C: 79.1 ml Diastology LV vol d, MOD A4C: 69.4 ml LV e' lateral:   4.80 cm/s LV vol s, MOD A2C: 36.5 ml LV E/e' lateral: 16.6 LV vol s, MOD A4C: 44.0 ml LV e' medial:    2.93 cm/s LV SV MOD A2C:     42.6 ml LV E/e' medial:  27.2 LV SV MOD A4C:     69.4 ml LV SV MOD BP:      36.6 ml AORTIC VALVE LVOT Vmax:   89.40 cm/s LVOT Vmean:  53.200 cm/s LVOT VTI:    0.154 m MITRAL VALVE MV Area (PHT): 2.99 cm    SHUNTS MV Decel Time: 254 msec    Systemic VTI: 0.15 m MV E velocity: 79.70 cm/s MV A velocity: 95.50 cm/s MV E/A ratio:  0.83 Loralie Champagne MD Electronically signed by Loralie Champagne MD Signature Date/Time:  12/12/2019/4:32:45 PM    Final     Microbiology Recent Results (from the past 240 hour(s))  Culture, respiratory     Status: None   Collection Time: 12/12/19 11:07 AM   Specimen: Bronchoalveolar Lavage; Respiratory  Result Value Ref Range Status   Specimen Description BRONCHIAL ALVEOLAR LAVAGE  Final   Special Requests NONE  Final   Gram Stain   Final    RARE WBC PRESENT, PREDOMINANTLY PMN RARE GRAM NEGATIVE RODS Performed at [facility], [place] [address] [place], [place] [address]    Culture FEW ENTEROBACTER CLOACAE  Final   Report Status 12/14/2019 FINAL  Final   Organism ID, Bacteria ENTEROBACTER CLOACAE  Final      Susceptibility   Enterobacter cloacae - MIC*    CEFAZOLIN >=64 RESISTANT Resistant     CEFEPIME 0.5 SENSITIVE Sensitive     CEFTAZIDIME 16 INTERMEDIATE Intermediate     CIPROFLOXACIN <=0.25 SENSITIVE Sensitive     GENTAMICIN <=1 SENSITIVE Sensitive     IMIPENEM 0.5 SENSITIVE Sensitive     TRIMETH/SULFA <=20 SENSITIVE Sensitive     PIP/TAZO 64 INTERMEDIATE Intermediate     * FEW ENTEROBACTER CLOACAE    Lab Basic Metabolic Panel: Recent Labs  Lab 12/17/19 0553 12/17/19 0836 12/17/19 0842 12/17/19 1550 12/17/19 1610 12/17/19 1958 12/17/19 2010 12/17/19 2010 12/17/19 2126 12/17/19 2241 12/17/19 2252 2019/12/19 0308 Dec 19, 2019 0421  NA 141   < > 143   < > 148*   < > 144  144   < > 145 146* 145 147* 146*  K 7.2*   < > 7.2*   < > 4.9   < > 4.9  4.9   < > 4.2 4.0 3.9 3.4* 3.2*  CL 110  --  112*  --  112*  --  107  --   --   --   --  97*  --  CO2 14*  --  12*  --  16*  --  12*  --   --   --   --  14*  --   GLUCOSE 401*  --  314*  --  68*  --  72  --   --   --   --  72  --   BUN 87*  --  88*  --  74*  --  56*  --   --   --   --  37*  --   CREATININE 4.51*  --  4.82*  --  4.07*  --  3.35*  --   --   --   --  2.58*  --   CALCIUM 7.6*  --  9.0  --  7.6*  --  7.2*  --   --   --   --  6.3*  --   MG  --   --   --   --   --   --  2.9*  --   --   --   --   2.9*  --   PHOS  --   --   --   --   --   --   --   --   --   --   --  9.8*  --    < > = values in this interval not displayed.   Liver Function Tests: Recent Labs  Lab 12/17/19 2010 12-26-19 0308  AST 3,389*  --   ALT 1,392*  --   ALKPHOS 343*  --   BILITOT 1.2  --   PROT 5.9*  --   ALBUMIN 1.6* 1.4*   No results for input(s): LIPASE, AMYLASE in the last 168 hours. No results for input(s): AMMONIA in the last 168 hours. CBC: Recent Labs  Lab 12/16/19 0400 12/16/19 0400 12/17/19 0332 12/17/19 0836 12/17/19 0842 12/17/19 1550 12/17/19 1610 12/17/19 1958 12/17/19 2126 12/17/19 2208 12/17/19 2241 12/17/19 2252 2019/12/26 0421  WBC 23.5*  --  27.6*  --  31.4*  --  29.9*  --   --  20.4*  --   --   --   HGB 7.5*   < > 7.1*   < > 7.1*   < > 7.2*   < > 6.8* 6.1* 6.8* 7.5* 8.8*  HCT 25.1*   < > 25.1*   < > 25.9*   < > 25.9*   < > 20.0* 22.0* 20.0* 22.0* 26.0*  MCV 105.9*  --  107.3*  --  111.6*  --  108.4*  --   --  107.8*  --   --   --   PLT 330  --  404*  --  491*  --  420*  --   --  264  --   --   --    < > = values in this interval not displayed.   Cardiac Enzymes: No results for input(s): CKTOTAL, CKMB, CKMBINDEX, TROPONINI in the last 168 hours. Sepsis Labs: Recent Labs  Lab 12/17/19 0332 12/17/19 0842 12/17/19 1610 12/17/19 2028 12/17/19 2208 12-26-2019 0309  WBC 27.6* 31.4* 29.9*  --  20.4*  --   LATICACIDVEN  --   --   --  >11.0*  --  >11.0*    Procedures/Operations  Mechanical ventilation, CRRT, coronary angiography with stenting.   Quantavia Frith 12/21/2019, 6:36 PM

## 2020-01-12 NOTE — Progress Notes (Signed)
PT Cancellation Note  Patient Details Name: [name] Bean MRN: [id number] DOB: [birth year]   Cancelled Treatment:    Reason Eval/Treat Not Completed: Medical issues which prohibited therapy (pt with additional arrest 9/5 currently intubated with FiO2 of 100%, on CRRT with transition to comfort)   Rebie Peale B Mischell Branford 12/26/19, 6:48 AM  Bayard Males, PT Acute Rehabilitation Services Pager: [phone] Office: [phone]

## 2020-01-12 NOTE — Progress Notes (Signed)
Patient was hemodynamically unstable throughout shift. Issues and concerns of metabolic acidosis, hypoglycemia, anemia, hypoxia communicated to CCM. Multiple interventions including CRRT with Sodium Bicarb drip, multiple bicarb pushes, 2 units of PRBC given, increase in vasopressors. See MAR orders, labs and physician notes. Patient family (Sister) at bedside through the night. CDS referral placed, communicated to call back with cardiac time of death. Patient sister to call unit with funeral home details.

## 2020-01-12 NOTE — Progress Notes (Signed)
Discarded 150 ml of Fentanyl from infusion bag in steri cycle with Donnetta Simpers, RN.  [name] Bean January 11, 2020 0830

## 2020-01-12 NOTE — Progress Notes (Signed)
[facility] Progress Note Patient Name: [name] Bean DOB: 1950/10/07 MRN: [id number]   Date of Service  2019-12-23  HPI/Events of Note  Patient's next of kin, Ms. Shequilla Goodgame asked to speak with me. She indicated that she wanted to transition her sister to comfort measures.  eICU Interventions  Comfort measures ordered.        Kerry Kass Malin Sambrano December 23, 2019, 6:23 AM

## 2020-01-12 DEATH — deceased

## 2021-06-24 IMAGING — MR MR CERVICAL SPINE W/O CM
4 of 5 series · 29 of 48 positions shown · non-contrast
Comparison: MRI 01/04/2018

CLINICAL DATA: Neck pain with arm and leg weakness over the last
year.

EXAM:
MRI CERVICAL SPINE WITHOUT CONTRAST
TECHNIQUE: Multiplanar, multisequence MR imaging of the cervical spine was
performed. No intravenous contrast was administered.

[Series 4: T2 · sagittal · 3.0mm · 0.66mm/px · 8 of 18 slices shown (1 of 2)]
[im 1/18]
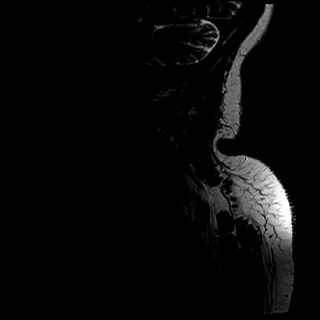
[im 3/18]
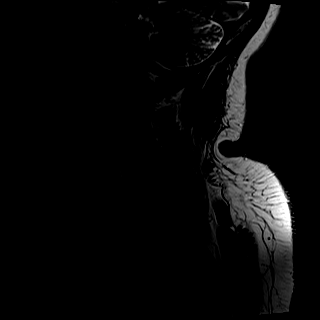
[im 5/18]
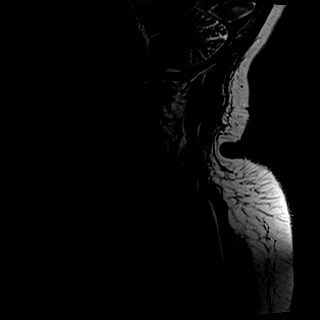
[im 8/18]
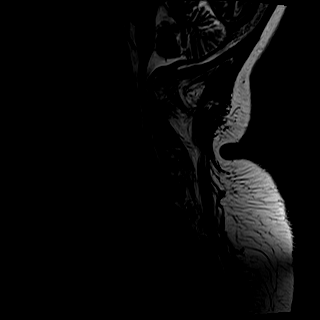
[im 10/18]
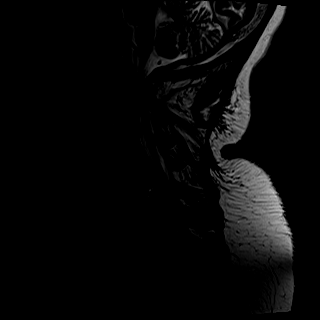
[im 13/18]
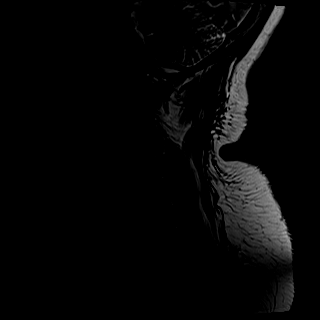
[im 15/18]
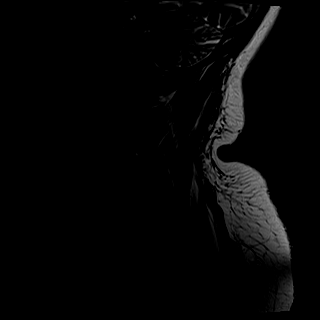
[im 18/18]
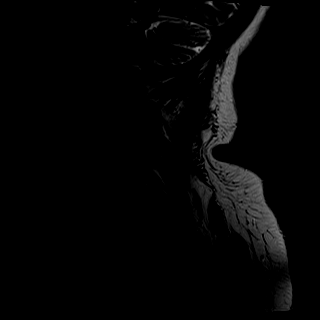

[Series 5: STIR · sagittal · 3.0mm · 0.41mm/px · 4 of 18 slices shown]
[im 1/18]
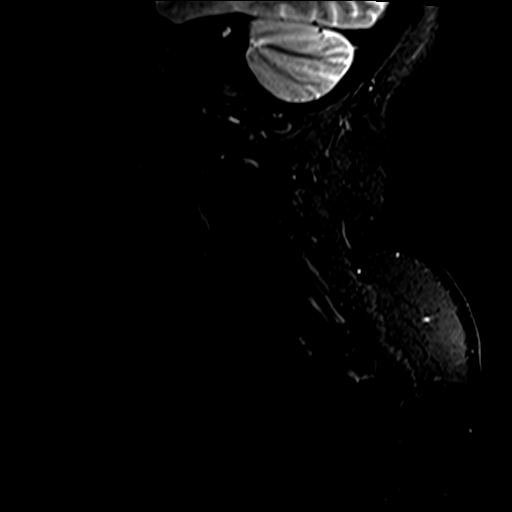
[im 3/18]
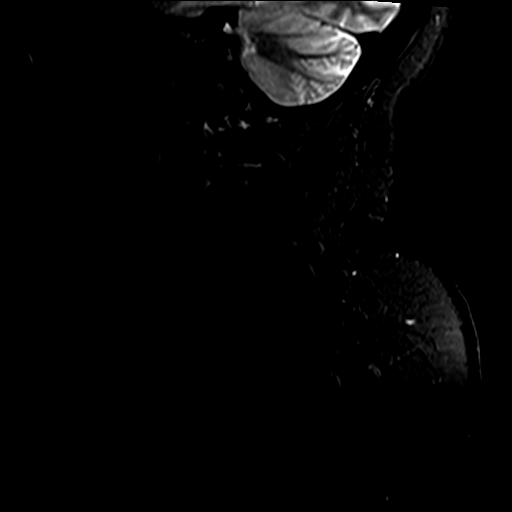
[im 10/18]
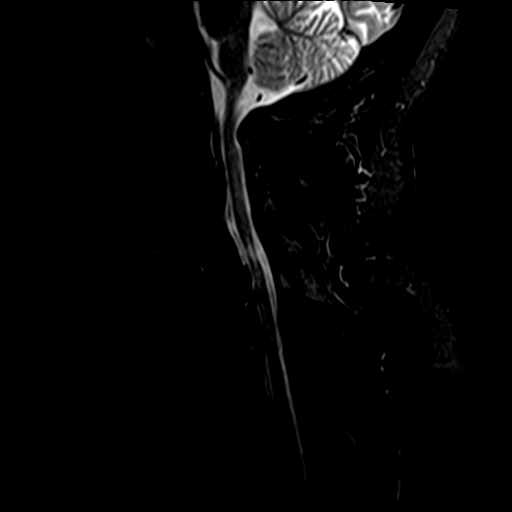
[im 15/18]
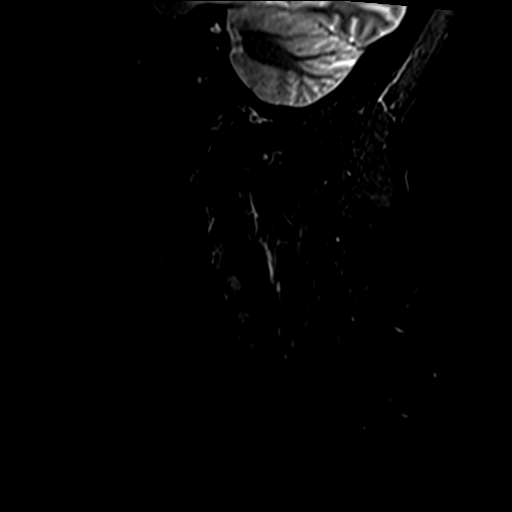

[Series 6: T1 · sagittal · 3.0mm · 0.41mm/px · 8 of 18 slices shown]
[im 1/18]
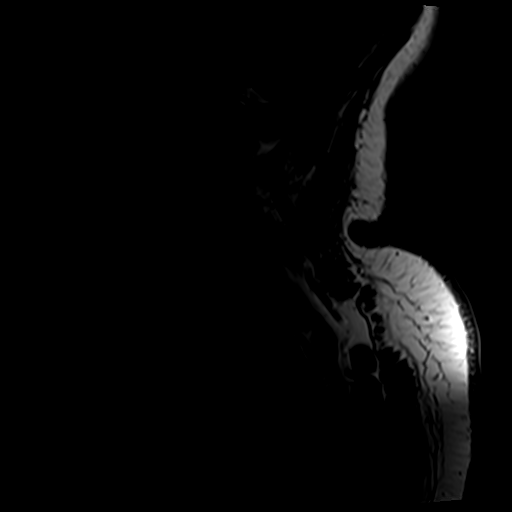
[im 3/18]
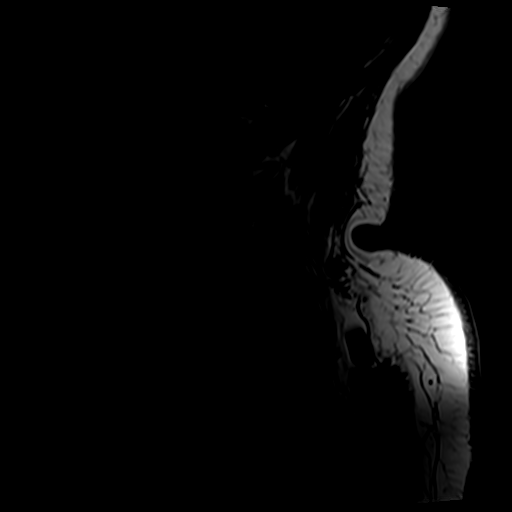
[im 5/18]
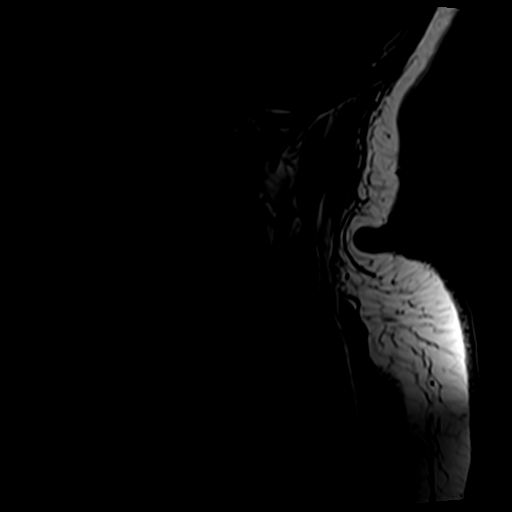
[im 8/18]
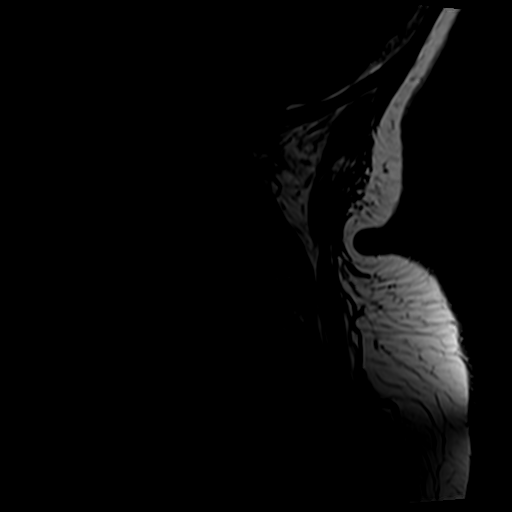
[im 10/18]
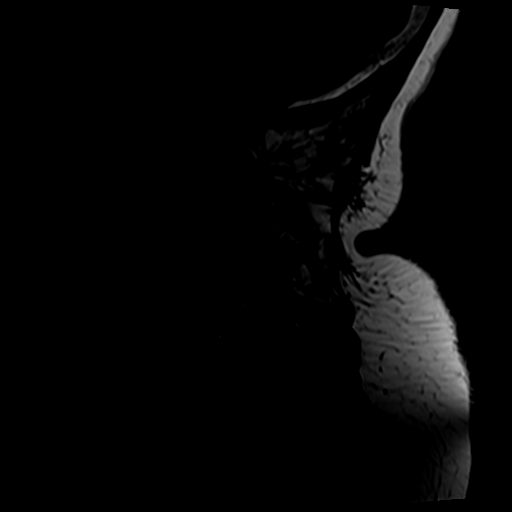
[im 13/18]
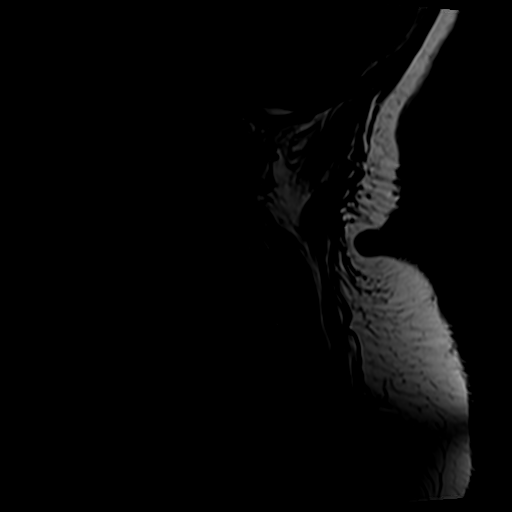
[im 15/18]
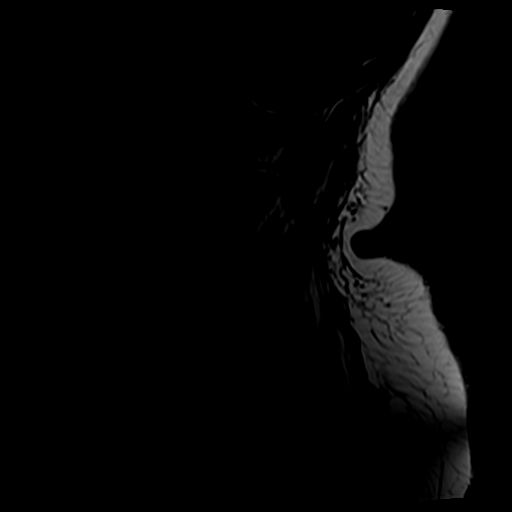
[im 18/18]
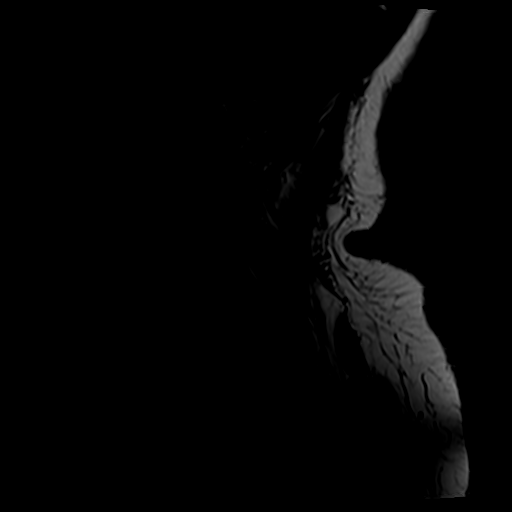

[Series 8: T2 · axial · 3.0mm · 0.70mm/px · z∈[-64,+24]mm · 9 of 28 slices shown (2 of 2)]
[im 1/28]
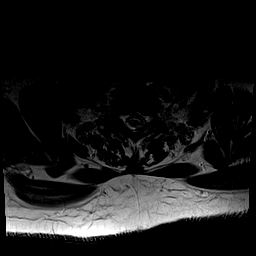
[im 5/28]
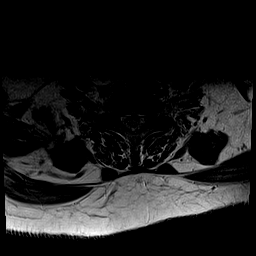
[im 8/28]
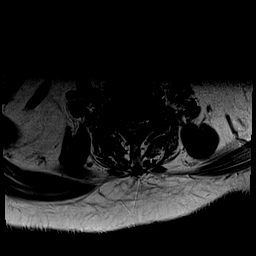
[im 13/28]
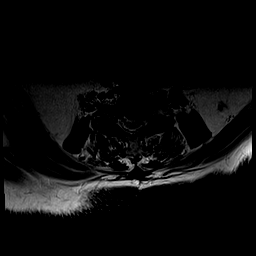
[im 15/28]
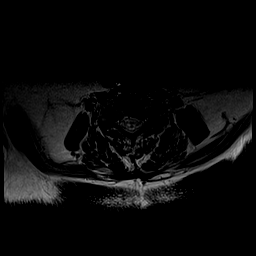
[im 20/28]
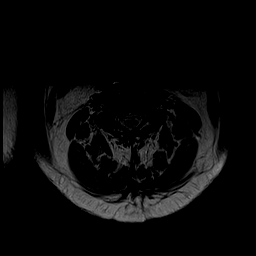
[im 23/28]
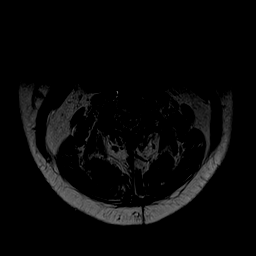
[im 25/28]
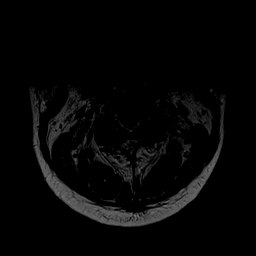
[im 28/28]
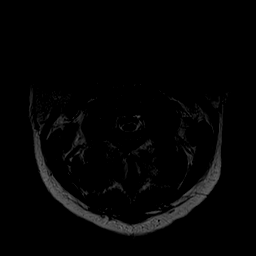

[29 of 48 positions shown; findings below may reference images not displayed]

FINDINGS: Alignment: Straightening of the normal cervical lordosis. No
unexpected malalignment.

Vertebrae: Previous cervical fusion from C3 through C6 with C4 and
C5 corpectomy.

Cord: Chronic myelomalacia at the C4-5 level as seen on the previous
study.

Posterior Fossa, vertebral arteries, paraspinal tissues: Negative

Disc levels:

Foramen magnum is patent.  C1-2 is unremarkable.

C2-3: Chronic calcification of the posterior longitudinal ligament,
narrowing the spinal canal with AP diameter in that segment
measuring 7 mm. No frank cord compression however. Bilateral facet
osteoarthritis without apparent compressive foraminal narrowing.

C3 through C6: Previous fusion with corpectomy at C4 and C5.
Sufficient patency of the canal and foramina allowing for evaluation
with artifact from the surgery. As noted above, chronic myelomalacia
is present at the C4-5 level.

C6-7: Bulging of the disc. Some posterior longitudinal ligament
calcification. Narrowing of the subarachnoid space but no cord
compression. AP diameter at the disc level measures 7.3 mm. Chronic
bilateral foraminal stenosis.

C7-T1: Endplate osteophytes and bulging of the disc. No compressive
canal stenosis. Foraminal stenosis left worse than right could
compress either C8 nerve, particularly the left.
IMPRESSION: Unchanged appearance of the cervical fusion from C3 through C6 with
corpectomy at C4 and C5. Chronic myelomalacia of the cord at the
C4-5 level.

C2-3 shows extensive ossification of the posterior longitudinal
ligament with canal narrowing but no frank cord compression. Facet
osteoarthritis at C2-3.

C6-7: Spondylosis and some posterior ligament calcification. Canal
narrowing but no cord compression. Bilateral foraminal stenosis
could affect either C7 nerve.

C7-T1: Spondylosis with bilateral foraminal stenosis left worse than
right. Either C8 nerve could be affected, particularly the left.
